# Patient Record
Sex: Male | Born: 1950
Health system: Southern US, Community
[De-identification: ages and names within clinical notes are randomized; demographics above are authoritative.]

## PROBLEM LIST (undated history)

## (undated) DIAGNOSIS — I712 Thoracic aortic aneurysm, without rupture, unspecified: Secondary | ICD-10-CM

## (undated) DIAGNOSIS — E119 Type 2 diabetes mellitus without complications: Secondary | ICD-10-CM

## (undated) DIAGNOSIS — F419 Anxiety disorder, unspecified: Secondary | ICD-10-CM

## (undated) DIAGNOSIS — E7800 Pure hypercholesterolemia, unspecified: Secondary | ICD-10-CM

## (undated) DIAGNOSIS — T8859XA Other complications of anesthesia, initial encounter: Secondary | ICD-10-CM

## (undated) DIAGNOSIS — J449 Chronic obstructive pulmonary disease, unspecified: Secondary | ICD-10-CM

## (undated) DIAGNOSIS — K851 Biliary acute pancreatitis without necrosis or infection: Secondary | ICD-10-CM

## (undated) DIAGNOSIS — M1990 Unspecified osteoarthritis, unspecified site: Secondary | ICD-10-CM

## (undated) DIAGNOSIS — N400 Enlarged prostate without lower urinary tract symptoms: Secondary | ICD-10-CM

## (undated) DIAGNOSIS — Q2381 Bicuspid aortic valve: Secondary | ICD-10-CM

## (undated) DIAGNOSIS — I359 Nonrheumatic aortic valve disorder, unspecified: Secondary | ICD-10-CM

## (undated) DIAGNOSIS — K219 Gastro-esophageal reflux disease without esophagitis: Secondary | ICD-10-CM

## (undated) DIAGNOSIS — Q231 Congenital insufficiency of aortic valve: Secondary | ICD-10-CM

## (undated) DIAGNOSIS — I480 Paroxysmal atrial fibrillation: Secondary | ICD-10-CM

## (undated) DIAGNOSIS — R7303 Prediabetes: Secondary | ICD-10-CM

## (undated) DIAGNOSIS — I2510 Atherosclerotic heart disease of native coronary artery without angina pectoris: Secondary | ICD-10-CM

## (undated) DIAGNOSIS — Z7901 Long term (current) use of anticoagulants: Secondary | ICD-10-CM

## (undated) DIAGNOSIS — G459 Transient cerebral ischemic attack, unspecified: Secondary | ICD-10-CM

## (undated) DIAGNOSIS — I10 Essential (primary) hypertension: Secondary | ICD-10-CM

## (undated) DIAGNOSIS — Z8679 Personal history of other diseases of the circulatory system: Secondary | ICD-10-CM

## (undated) DIAGNOSIS — I639 Cerebral infarction, unspecified: Secondary | ICD-10-CM

## (undated) DIAGNOSIS — F32A Depression, unspecified: Secondary | ICD-10-CM

## (undated) HISTORY — PX: ACHILLES TENDON REPAIR: SUR1153

## (undated) HISTORY — DX: Nonrheumatic aortic valve disorder, unspecified: I35.9

## (undated) HISTORY — DX: Essential (primary) hypertension: I10

## (undated) HISTORY — DX: Benign prostatic hyperplasia without lower urinary tract symptoms: N40.0

## (undated) HISTORY — DX: Pure hypercholesterolemia, unspecified: E78.00

## (undated) HISTORY — PX: BACK SURGERY: SHX140

## (undated) HISTORY — DX: Transient cerebral ischemic attack, unspecified: G45.9

## (undated) HISTORY — DX: Chronic obstructive pulmonary disease, unspecified: J44.9

## (undated) HISTORY — DX: Unspecified osteoarthritis, unspecified site: M19.90

## (undated) HISTORY — PX: FOOT SURGERY: SHX648

## (undated) HISTORY — PX: CARDIAC VALVE REPLACEMENT: SHX585

## (undated) HISTORY — DX: Thoracic aortic aneurysm, without rupture: I71.2

## (undated) HISTORY — DX: Thoracic aortic aneurysm, without rupture, unspecified: I71.20

## (undated) HISTORY — PX: ASCENDING AORTIC ANEURYSM REPAIR: SHX1191

## (undated) HISTORY — DX: Gastro-esophageal reflux disease without esophagitis: K21.9

## (undated) HISTORY — PX: AORTIC VALVE REPLACEMENT: SHX41

---

## 2002-10-17 ENCOUNTER — Ambulatory Visit (HOSPITAL_COMMUNITY): Admission: RE | Admit: 2002-10-17 | Discharge: 2002-10-17 | Payer: Self-pay

## 2006-11-30 HISTORY — PX: LEFT HEART CATH AND CORONARY ANGIOGRAPHY: CATH118249

## 2006-12-01 HISTORY — PX: ASCENDING AORTIC ANEURYSM REPAIR W/ MECHANICAL AORTIC VALVE REPLACEMENT: SHX1192

## 2010-02-03 DIAGNOSIS — G459 Transient cerebral ischemic attack, unspecified: Secondary | ICD-10-CM

## 2010-02-03 HISTORY — DX: Transient cerebral ischemic attack, unspecified: G45.9

## 2010-03-01 ENCOUNTER — Emergency Department (HOSPITAL_BASED_OUTPATIENT_CLINIC_OR_DEPARTMENT_OTHER)
Admission: EM | Admit: 2010-03-01 | Discharge: 2010-03-01 | Disposition: A | Discharge status: Other Acute Inpatient Hospital | Payer: Self-pay | Source: Home / Self Care | Admitting: Emergency Medicine

## 2010-03-01 ENCOUNTER — Inpatient Hospital Stay (INDEPENDENT_AMBULATORY_CARE_PROVIDER_SITE_OTHER)
Admission: AD | Admit: 2010-03-01 | Discharge: 2010-03-03 | Payer: Self-pay | Source: Home / Self Care | Attending: Internal Medicine | Admitting: Internal Medicine

## 2010-03-01 DIAGNOSIS — R413 Other amnesia: Secondary | ICD-10-CM

## 2010-03-01 DIAGNOSIS — R209 Unspecified disturbances of skin sensation: Secondary | ICD-10-CM

## 2010-03-01 DIAGNOSIS — R42 Dizziness and giddiness: Secondary | ICD-10-CM

## 2010-03-01 LAB — BASIC METABOLIC PANEL
BUN: 21 mg/dL (ref 6–23)
CO2: 24 mEq/L (ref 19–32)
Calcium: 9.5 mg/dL (ref 8.4–10.5)
Chloride: 107 mEq/L (ref 96–112)
Creatinine, Ser: 1.2 mg/dL (ref 0.4–1.5)
GFR calc Af Amer: >60 mL/min (ref >60)
GFR calc non Af Amer: >60 mL/min (ref >60)
Glucose, Bld: 110 mg/dL — ABNORMAL HIGH (ref 70–99)
Potassium: 4.2 mEq/L (ref 3.5–5.1)
Sodium: 143 mEq/L (ref 135–145)

## 2010-03-01 LAB — POCT CARDIAC MARKERS
CKMB, poc: 1.8 ng/mL (ref 1.0–8.0)
Myoglobin, poc: 100 ng/mL (ref 12–200)
Troponin i, poc: <0.05 ng/mL (ref 0.00–0.09)

## 2010-03-01 LAB — COMPREHENSIVE METABOLIC PANEL
ALT: 28 U/L (ref 0–53)
AST: 32 U/L (ref 0–37)
Albumin: 3.9 g/dL (ref 3.5–5.2)
Alkaline Phosphatase: 55 U/L (ref 39–117)
BUN: 16 mg/dL (ref 6–23)
CO2: 27 mEq/L (ref 19–32)
Calcium: 9.3 mg/dL (ref 8.4–10.5)
Chloride: 106 mEq/L (ref 96–112)
Creatinine, Ser: 1.22 mg/dL (ref 0.4–1.5)
GFR calc Af Amer: >60 mL/min (ref >60)
GFR calc non Af Amer: >60 mL/min (ref >60)
Glucose, Bld: 117 mg/dL — ABNORMAL HIGH (ref 70–99)
Potassium: 4 mEq/L (ref 3.5–5.1)
Sodium: 139 mEq/L (ref 135–145)
Total Bilirubin: 1.6 mg/dL — ABNORMAL HIGH (ref 0.3–1.2)
Total Protein: 7.1 g/dL (ref 6.0–8.3)

## 2010-03-01 LAB — PROTIME-INR
INR: 1.32 (ref 0.00–1.49)
INR: 1.36 (ref 0.00–1.49)
Prothrombin Time: 16.6 seconds — ABNORMAL HIGH (ref 11.6–15.2)
Prothrombin Time: 17 seconds — ABNORMAL HIGH (ref 11.6–15.2)

## 2010-03-01 LAB — DIFFERENTIAL
Basophils Absolute: 0 10*3/uL (ref 0.0–0.1)
Basophils Relative: 1 % (ref 0–1)
Eosinophils Absolute: 0.3 10*3/uL (ref 0.0–0.7)
Eosinophils Relative: 5 % (ref 0–5)
Lymphocytes Relative: 44 % (ref 12–46)
Lymphs Abs: 2.4 10*3/uL (ref 0.7–4.0)
Monocytes Absolute: 0.6 10*3/uL (ref 0.1–1.0)
Monocytes Relative: 10 % (ref 3–12)
Neutro Abs: 2.2 10*3/uL (ref 1.7–7.7)
Neutrophils Relative %: 40 % — ABNORMAL LOW (ref 43–77)

## 2010-03-01 LAB — CARDIAC PANEL(CRET KIN+CKTOT+MB+TROPI)
CK, MB: 1.7 ng/mL (ref 0.3–4.0)
Relative Index: 0.8 (ref 0.0–2.5)
Total CK: 218 U/L (ref 7–232)
Troponin I: 0.01 ng/mL (ref 0.00–0.06)

## 2010-03-01 LAB — CBC
HCT: 38.5 % — ABNORMAL LOW (ref 39.0–52.0)
HCT: 38 % — ABNORMAL LOW (ref 39.0–52.0)
Hemoglobin: 13.8 g/dL (ref 13.0–17.0)
Hemoglobin: 13.7 g/dL (ref 13.0–17.0)
MCH: 30.7 pg (ref 26.0–34.0)
MCH: 31.8 pg (ref 26.0–34.0)
MCHC: 35.8 g/dL (ref 30.0–36.0)
MCHC: 36.1 g/dL — ABNORMAL HIGH (ref 30.0–36.0)
MCV: 85.6 fL (ref 78.0–100.0)
MCV: 88.2 fL (ref 78.0–100.0)
Platelets: 171 10*3/uL (ref 150–400)
Platelets: 152 10*3/uL (ref 150–400)
RBC: 4.5 MIL/uL (ref 4.22–5.81)
RBC: 4.31 MIL/uL (ref 4.22–5.81)
RDW: 13 % (ref 11.5–15.5)
RDW: 12.9 % (ref 11.5–15.5)
WBC: 5.6 10*3/uL (ref 4.0–10.5)
WBC: 6.1 10*3/uL (ref 4.0–10.5)

## 2010-03-01 LAB — URINALYSIS, ROUTINE W REFLEX MICROSCOPIC
Bilirubin Urine: NEGATIVE
Hgb urine dipstick: NEGATIVE
Ketones, ur: NEGATIVE mg/dL
Nitrite: NEGATIVE
Protein, ur: NEGATIVE mg/dL
Specific Gravity, Urine: 1.015 (ref 1.005–1.030)
Urine Glucose, Fasting: NEGATIVE mg/dL
Urobilinogen, UA: 0.2 mg/dL (ref 0.0–1.0)
pH: 5.5 (ref 5.0–8.0)

## 2010-03-01 LAB — GLUCOSE, CAPILLARY: Glucose-Capillary: 125 mg/dL — ABNORMAL HIGH (ref 70–99)

## 2010-03-01 LAB — APTT: aPTT: 35 seconds (ref 24–37)

## 2010-03-02 LAB — GLUCOSE, CAPILLARY
Glucose-Capillary: 107 mg/dL — ABNORMAL HIGH (ref 70–99)
Glucose-Capillary: 121 mg/dL — ABNORMAL HIGH (ref 70–99)
Glucose-Capillary: 115 mg/dL — ABNORMAL HIGH (ref 70–99)
Glucose-Capillary: 98 mg/dL (ref 70–99)
Glucose-Capillary: 147 mg/dL — ABNORMAL HIGH (ref 70–99)

## 2010-03-02 LAB — LIPID PANEL
Cholesterol: 118 mg/dL (ref 0–200)
HDL: 21 mg/dL — ABNORMAL LOW (ref >39)
LDL Cholesterol: 67 mg/dL (ref 0–99)
Total CHOL/HDL Ratio: 5.6 RATIO
Triglycerides: 152 mg/dL — ABNORMAL HIGH (ref <150)
VLDL: 30 mg/dL (ref 0–40)

## 2010-03-02 LAB — URINALYSIS, ROUTINE W REFLEX MICROSCOPIC
Bilirubin Urine: NEGATIVE
Hgb urine dipstick: NEGATIVE
Ketones, ur: NEGATIVE mg/dL
Nitrite: NEGATIVE
Protein, ur: NEGATIVE mg/dL
Specific Gravity, Urine: 1.015 (ref 1.005–1.030)
Urine Glucose, Fasting: NEGATIVE mg/dL
Urobilinogen, UA: 0.2 mg/dL (ref 0.0–1.0)
pH: 5.5 (ref 5.0–8.0)

## 2010-03-02 LAB — URINE CULTURE
Colony Count: NO GROWTH
Culture  Setup Time: 201201271741
Culture: NO GROWTH

## 2010-03-02 LAB — HEMOGLOBIN A1C
Hgb A1c MFr Bld: 6.2 % — ABNORMAL HIGH (ref <5.7)
Mean Plasma Glucose: 131 mg/dL — ABNORMAL HIGH (ref <117)

## 2010-03-03 LAB — GLUCOSE, CAPILLARY
Glucose-Capillary: 135 mg/dL — ABNORMAL HIGH (ref 70–99)
Glucose-Capillary: 153 mg/dL — ABNORMAL HIGH (ref 70–99)

## 2010-03-03 LAB — PROTIME-INR
INR: 1.07 (ref 0.00–1.49)
Prothrombin Time: 14.1 seconds (ref 11.6–15.2)

## 2010-03-14 ENCOUNTER — Ambulatory Visit (HOSPITAL_COMMUNITY)
Admission: RE | Admit: 2010-03-14 | Discharge: 2010-03-14 | Disposition: A | Discharge status: Home / Self Care | Payer: 59 | Source: Ambulatory Visit | Attending: Radiation Oncology | Admitting: Radiation Oncology

## 2010-03-14 DIAGNOSIS — G459 Transient cerebral ischemic attack, unspecified: Secondary | ICD-10-CM | POA: Insufficient documentation

## 2010-03-14 DIAGNOSIS — I672 Cerebral atherosclerosis: Secondary | ICD-10-CM | POA: Insufficient documentation

## 2010-03-14 NOTE — H&P (Signed)
NAME:  Alexander Barton, FRERKING NO.:  [id number]  MEDICAL RECORD NO.:  [id number]          PATIENT TYPE:  INP  LOCATION:  1417                         FACILITY:  [facility]  PHYSICIAN:  Calvert Cantor, M.D.     DATE OF BIRTH:  08-13-1950  DATE OF ADMISSION:  03/01/2010 DATE OF DISCHARGE:                             HISTORY & PHYSICAL   PRIMARY CARE PHYSICIAN:  Dr. Kathrynn Running at [facility].  The patient does not know the PCP's first name.  PRESENTING COMPLAINT:  Numbness and tingling and memory loss, temporary  HISTORY OF PRESENT ILLNESS:  This is a 60 year old male who has had an aortic valve repair, repair of an ascending aortic aneurysm, diabetes mellitus, hyperlipidemia, COPD, and gastroesophageal reflux disease.  The patient went to the [facility] today with a complaint of numbness and tingling in the face.  Apparently, his symptoms started yesterday afternoon at 3:00 p.m.  He initially noticed a funny sensation in the upper part of his face.  He points to his cheek bones and his forehead.  He states that it felt like the numbness or tingling.  He also noted sudden trouble with his long term memory.  He was at work and he was not able to recall simple things that he usually would be able to remember.  It lasted for 1 hour.  He felt an intense amount of fatigue during the episode which continued after the episode.  This morning, he woke up feeling continued fatigue and continued tingling and numbness in his upper face and then decided to go to [facility].  At [facility], it was noted that he had a mild facial droop as well, and therefore he was referred for admission.  Family is present during the interview and do not notice that the patient has a facial droop. The patient has not noticed it either; therefore, it is difficult to say if this is new or old.  When asked about slurred speech, his wife states that day before yesterday, she  noticed one sentence that was slurred but he has not had any other slurred speech.  The patient does not complain of any other focal numbness, weakness, or tingling.  He did not notice any visual changes such as blurred vision or double vision.  He does not complain of any headaches.  The patient does have a mechanical valve and needs to keep his INR between 1.5 and 2.0.  When checked this morning, his INR was low at 1.3. He has not yet taken his Coumadin for the day.  PAST MEDICAL HISTORY: 1. Ascending aortic aneurysm repair 3 years ago.  The patient states     that he currently has a small ulcer in the posterior wall which is     being followed on a yearly basis at [facility]. 2. Aortic valve replacement with Onyx valve for which his INR needs to     be between 1.5 and 2.0. 3. Diabetes mellitus, recently found in December which he is     controlling through diet and exercise. 4. Chronic obstructive pulmonary disease due to working in  a [place]     [place]. 5. Gastroesophageal reflux disease. 6. Hyperlipidemia, currently controlled on Lipitor. 7. BPH.  PAST SURGICAL HISTORY:  Other surgery includes: 1. Surgery for hammer toes bilaterally. 2. Surgery on the right foot. 3. Surgery in the right lower eyelid.  MEDICATIONS:  He takes: 1. Coumadin 7 mg alternating with 5 mg every other day.  Today, he is     due to take 7.  Coumadin is at bedtime. 2. Metoprolol ER 50 mg daily. 3. Tamsulosin, generic for Flomax, is 0.4 mg at bedtime. 4. Lipitor is 20 mg at bedtime. 5. Aspirin is 81 mg at bedtime. 6. Spiriva 18 mcg daily. 7. Prevacid 30 mg daily.  SOCIAL HISTORY:  Does not smoke or drink.  Works in [employer].  FAMILY HISTORY:  Father had Alzheimer's.  Mother has a defibrillator.  ALLERGIES: 1. HE IS ALLERGIC TO CONTRAST DYE. 2. HE IS ALLERGIC TO CODEINE WHICH CAUSES ANAPHYLAXIS. 3. SEROQUEL CAUSES DELUSIONS.  REVIEW OF SYSTEMS:  No recent weight loss or weight gain.  No  frequent headaches.  No blurred vision, double vision.  No sore throat, sinus trouble, or earache.  RESPIRATORY:  Mild dry cough.  No wheezing.  No shortness of breath.  CARDIAC:  No chest pain, palpitations, or pedal edema.  GI:  No nausea, vomiting, or diarrhea.  He has constipation and mild reflux.  GU:  He has troubles with urination which are controlled with his tamsulosin.  HEMATOLOGICALLY:  Bruises easily.  SKIN:  No rash. NEUROLOGICALLY:  He has never had a stroke or seizure in the past. PSYCHOLOGICALLY:  No anxiety, depression.  MUSCULOSKELETAL:  Does complain of right shoulder pain which he describes as a constant ache.  PHYSICAL EXAM:  GENERAL:  Elderly male laying in bed in no acute distress. VITALS:  Temperature 97.4, pulse 68, respirations 18, blood pressure 130/86, pulse ox 95% on room air. HEENT:  Pupils equal, round, reactive to light.  Extraocular movements are intact.  Conjunctiva is pink.  No scleral icterus.  Oral mucosa is moist.  Normal dentition. NECK:  Supple.  No thyromegaly, lymphadenopathy, carotid bruits. HEART:  Regular rate and rhythm.  No murmurs.  There is click in the aortic area.  No rubs, no gallops. LUNGS:  Clear bilaterally.  Normal respiratory effort.  No use of accessory muscles. ABDOMEN:  Soft, nontender, nondistended.  Bowel sounds positive.  No organomegaly. EXTREMITIES:  No cyanosis, clubbing, or edema.  Pedal pulses positive. NEUROLOGICALLY:  He does have a mild right facial droop but when asked to smile, he is able to smile appropriately.  Therefore, I would not call this a facial nerve paralysis.  Cranial nerves II-XII are intact. Strength is 5/5 in all 4 extremities.  Sensation, I have checked in sensation in all areas of the face and currently there is no decreased sensation to light touch.  The patient still continues to state that he has a funny tingling feeling in the upper part of his face. PSYCHOLOGICALLY:  Awake, alert, oriented  x3.  Mood and affect normal. SKIN:  Warm, dry.  No rash or bruising.  BLOOD WORK:  Abnormal blood work.  Glucose is slightly elevated at 110. His INR is 1.32.  CT of the head without contrast reveals small vessel ischemic changes and brain atrophy.  Chest x-ray 2-view does not reveal any acute disease.  EKG is sinus rhythm at 68 beats per minute.  ASSESSMENT/PLAN: 1. Numbness and tingling in the upper part of the face with  episode of     poor memory yesterday, I would call this a TIA rather than a CVA as     currently I cannot find any obvious signs on exam.  I have ordered     an MRI without contrast.  In addition, I have ordered carotid     Dopplers, 2-D echo, and neuro checks.  I have not ordered a lipid     profile as she states it was just done in December and was normal.     Since his INR is subtherapeutic, I will place him on full dose     Lovenox which should be discontinued once his INR reaches a level     greater than 1.5. 2. Diet-controlled diabetes mellitus.  Will place him on a sliding     scale and diabetic carb controlled diet. 3. Aortic valve replacement.  As mentioned above, he will be on     Lovenox until INR is therapeutic. 4. Chronic obstructive pulmonary disease.  Continue Spiriva 5. Benign prostatic hypertrophy. 6. History of thoracic aneurysm repair. 7. Gastroesophageal reflux disease. 8. Time on admission was 60 minutes. 9. The patient is a limited code.  He does want CPR but he does not     want to be intubated.     Calvert Cantor, M.D.     SR/MEDQ  D:  03/01/2010  T:  03/01/2010  Job:  [id number]  cc:   Dr. Kathrynn Running at [facility].  Electronically Signed by Calvert Cantor M.D. on 03/14/2010 12:29:13 PM

## 2010-03-14 NOTE — Discharge Summary (Signed)
NAME:  Alexander Barton, Alexander Barton NO.:  [id number]  MEDICAL RECORD NO.:  [id number]          PATIENT TYPE:  INP  LOCATION:  1417                         FACILITY:  [facility]  PHYSICIAN:  Calvert Cantor, M.D.     DATE OF BIRTH:  Jun 22, 1950  DATE OF ADMISSION:  03/01/2010 DATE OF DISCHARGE:  03/03/2010                              DISCHARGE SUMMARY   PRIMARY CARE PHYSICIAN:  Dr. Kathrynn Running at [facility].  PRESENTING COMPLAINT:  Numbness and tingling and memory loss.  DISCHARGE DIAGNOSES: 1. Probable transient ischemic attack. 2. Subtherapeutic INR. 3. Mechanical aortic valve, which is an On-X valve.  INR needs to be     between 1.5 and 2. 4. Ascending aortic aneurysm repair 3 years ago. 5. Diabetes mellitus, diet controlled. 6. Chronic obstructive pulmonary disease\ due to working in     [place]. 7. Gastroesophageal reflux disease. 8. Hyperlipidemia. 9. Benign prostatic hyperplasia.  DISCHARGE MEDICATIONS:1. Patient is being discharged on Lovenox 100 mg subcutaneously q.12h.     This will be discontinued when his INR level is greater than 1.5. 2. He can continue taking his Coumadin at 7.5 or 10 mg daily.  The     patient will be monitoring INRs at home. 3. Aspirin 81 mg daily. 4. Cinnamon 2000 mg OTC daily. 5. Metoprolol 50 mg daily. 6. Multivitamin 1 tablet daily. 7. Spiriva 18 mcg daily. 8. Tamsulosin 0.4 mg daily at bedtime.  HOSPITAL COURSE:  This is a 60 year old gentleman, who went to the [facility] with a complaint of numbness and tingling in his face.  He was referred from [facility] for admission.  Upon my initial conversation with the ER doctor, she stated that he had right- sided facial droop and left-sided facial numbness; however, once the patient arrived to the hospital and I saw him myself, the patient stated that his numbness was present in the upper part of his face.  He stated that it had started the day before around 3 o'clock in  the afternoon. He had an episode at that time of this funny feeling in his face and it was associated with memory loss and severe fatigue, which lasted a good hour, subsequently, the memory loss improved, but the numbness and tingling did not resolve and therefore he decided to come to the hospital on the following day.  Patient underwent a neurological workup including an MRI of his brain, which is negative and a 2-D echo, which reveals the following: 1. Wall thickness was increased in the pattern of mild LVH, systolic     function was normal, estimated EF was 55%-60%.  There were no     regional wall motion abnormalities noted, mildly thickened aortic     valve, status post AVR with On-X valve.  No stenosis.  No     regurgitation. 2. Aorta was not dilated status post ascending aorta aortic aneurysm     repair. 3. Mitral regurgitation. 4. Left atrium was moderately dilated. 5. Atrial septum boud from left to right consistent with increased     left atrial filling pressure, a very small patent forearm ovale     cannot be  excluded, consider TEE for further evaluation if     indicated.  Patient declined a TEE also since he had an extensive cardiac workup, AVR and an aneurysm repair just 3 years before.  I highly doubt that he does have a patent foremen ovale as admit to with been found at that time.  Neck:.  Please note other radiological findings are as below: 1. Chest x-ray two-view, no acute disease. 2. CT of the head without contrast, small vessel ischemic change and     brain atrophy. 3. MRI of the brain without contrast reveals no evidence for acute or     subacute infarction.  There are scattered subcortical T2     hyperintensities, which are advanced for age.  Finding is     nonspecific, but can be seen in the setting of chronic     microvascular ischemia, demyelinating process such as multiple     sclerosis, vasculitis, complicated migraine headaches or as the      sequelae of a prior infectious or inflammatory process. 4. Mild anterior ethmoid and frontal sinus disease.  CONDITION ON DISCHARGE:  Stable follow up with.  FOLLOWUP:  Following with Dr. Kathrynn Running in 1 week.  Time on discharge 65 minutes.     Calvert Cantor, M.D.     SR/MEDQ  D:  03/05/2010  T:  03/05/2010  Job:  [id number]  cc:   Dr. Kathrynn Running  Electronically Signed by Calvert Cantor M.D. on 03/14/2010 12:29:07 PM

## 2010-03-25 ENCOUNTER — Institutional Professional Consult (permissible substitution) (INDEPENDENT_AMBULATORY_CARE_PROVIDER_SITE_OTHER): Payer: 59 | Admitting: Cardiology

## 2010-03-25 DIAGNOSIS — I359 Nonrheumatic aortic valve disorder, unspecified: Secondary | ICD-10-CM

## 2010-03-25 DIAGNOSIS — G459 Transient cerebral ischemic attack, unspecified: Secondary | ICD-10-CM

## 2010-05-06 ENCOUNTER — Telehealth: Payer: Self-pay | Admitting: Cardiology

## 2010-05-06 NOTE — Telephone Encounter (Signed)
NEEDS NOTES AND ANY TESTING FROM 03/25/2010 TO PRESENT DR. MANNING SENT PATIENT Alexander Barton IS FROM IS OFFICE FAX [phone]

## 2010-12-10 ENCOUNTER — Ambulatory Visit (INDEPENDENT_AMBULATORY_CARE_PROVIDER_SITE_OTHER): Payer: 59 | Admitting: Cardiology

## 2010-12-10 ENCOUNTER — Telehealth: Payer: Self-pay | Admitting: *Deleted

## 2010-12-10 ENCOUNTER — Encounter: Payer: Self-pay | Admitting: Cardiology

## 2010-12-10 VITALS — BP 112/70 | HR 68 | Ht 74.0 in | Wt 235.8 lb

## 2010-12-10 DIAGNOSIS — Z952 Presence of prosthetic heart valve: Secondary | ICD-10-CM

## 2010-12-10 DIAGNOSIS — E7800 Pure hypercholesterolemia, unspecified: Secondary | ICD-10-CM

## 2010-12-10 DIAGNOSIS — N400 Enlarged prostate without lower urinary tract symptoms: Secondary | ICD-10-CM

## 2010-12-10 DIAGNOSIS — E785 Hyperlipidemia, unspecified: Secondary | ICD-10-CM | POA: Insufficient documentation

## 2010-12-10 DIAGNOSIS — G459 Transient cerebral ischemic attack, unspecified: Secondary | ICD-10-CM

## 2010-12-10 DIAGNOSIS — I712 Thoracic aortic aneurysm, without rupture: Secondary | ICD-10-CM

## 2010-12-10 DIAGNOSIS — I359 Nonrheumatic aortic valve disorder, unspecified: Secondary | ICD-10-CM | POA: Insufficient documentation

## 2010-12-10 DIAGNOSIS — E119 Type 2 diabetes mellitus without complications: Secondary | ICD-10-CM | POA: Insufficient documentation

## 2010-12-10 DIAGNOSIS — R5381 Other malaise: Secondary | ICD-10-CM

## 2010-12-10 DIAGNOSIS — I10 Essential (primary) hypertension: Secondary | ICD-10-CM | POA: Insufficient documentation

## 2010-12-10 DIAGNOSIS — R0600 Dyspnea, unspecified: Secondary | ICD-10-CM | POA: Insufficient documentation

## 2010-12-10 DIAGNOSIS — R5383 Other fatigue: Secondary | ICD-10-CM

## 2010-12-10 LAB — BASIC METABOLIC PANEL
BUN: 21 mg/dL (ref 6–23)
CO2: 26 mEq/L (ref 19–32)
Calcium: 9.4 mg/dL (ref 8.4–10.5)
Chloride: 104 mEq/L (ref 96–112)
Creatinine, Ser: 1.3 mg/dL (ref 0.4–1.5)
GFR: 62.16 mL/min (ref >60.00)
Glucose, Bld: 96 mg/dL (ref 70–99)
Potassium: 4.5 mEq/L (ref 3.5–5.1)
Sodium: 138 mEq/L (ref 135–145)

## 2010-12-10 LAB — CBC WITH DIFFERENTIAL/PLATELET
Basophils Absolute: 0 10*3/uL (ref 0.0–0.1)
Basophils Relative: 0.3 % (ref 0.0–3.0)
Eosinophils Absolute: 0.3 10*3/uL (ref 0.0–0.7)
Eosinophils Relative: 4.1 % (ref 0.0–5.0)
HCT: 42.8 % (ref 39.0–52.0)
Hemoglobin: 14.8 g/dL (ref 13.0–17.0)
Lymphocytes Relative: 36.6 % (ref 12.0–46.0)
Lymphs Abs: 2.8 10*3/uL (ref 0.7–4.0)
MCHC: 34.5 g/dL (ref 30.0–36.0)
MCV: 92.3 fl (ref 78.0–100.0)
Monocytes Absolute: 0.7 10*3/uL (ref 0.1–1.0)
Monocytes Relative: 9.6 % (ref 3.0–12.0)
Neutro Abs: 3.8 10*3/uL (ref 1.4–7.7)
Neutrophils Relative %: 49.4 % (ref 43.0–77.0)
Platelets: 176 10*3/uL (ref 150.0–400.0)
RBC: 4.64 Mil/uL (ref 4.22–5.81)
RDW: 13.7 % (ref 11.5–14.6)
WBC: 7.8 10*3/uL (ref 4.5–10.5)

## 2010-12-10 LAB — TSH: TSH: 1.78 u[IU]/mL (ref 0.35–5.50)

## 2010-12-10 LAB — HEPATIC FUNCTION PANEL
ALT: 29 U/L (ref 0–53)
AST: 27 U/L (ref 0–37)
Albumin: 4.2 g/dL (ref 3.5–5.2)
Alkaline Phosphatase: 60 U/L (ref 39–117)
Bilirubin, Direct: 0.1 mg/dL (ref 0.0–0.3)
Total Bilirubin: 1 mg/dL (ref 0.3–1.2)
Total Protein: 7.3 g/dL (ref 6.0–8.3)

## 2010-12-10 LAB — PSA: PSA: 0.23 ng/mL (ref 0.10–4.00)

## 2010-12-10 NOTE — Assessment & Plan Note (Signed)
Blood pressure is well controlled on exam today. 

## 2010-12-10 NOTE — Assessment & Plan Note (Signed)
His bowel function is stable by exam and echocardiogram. His LV function is normal. He will continue with anticoagulation and he needs routine SBE prophylaxis.

## 2010-12-10 NOTE — Progress Notes (Signed)
Alexander Barton Date of Birth: 1950-12-22 Medical Record #[id number]  History of Present Illness: Alexander Barton is seen today for followup. He reports that he is feeling well. He admits that he is not exercising as much because he doesn't have energy. He does complain of dyspnea with fairly minimal exertion. His weight has increased. He states that since his TIA in January just hasn't had much motivation. He recently had evaluation at [facility] including an echocardiogram. This was fairly unremarkable demonstrating normal prosthetic aortic valve function. They considered doing a followup CT but given his history of dye allergy they deferred this. His CTs of the past 3 years have been stable. When he had his aortic valve replacement in 2008 he had no coronary disease at that time.  Current Outpatient Prescriptions on File Prior to Visit  Medication Sig Dispense Refill  . aspirin 81 MG tablet Take 81 mg by mouth daily.        . metoprolol (LOPRESSOR) 50 MG tablet Take 50 mg by mouth 2 (two) times daily.        Marland Kitchen omeprazole (PRILOSEC) 20 MG capsule Take 20 mg by mouth daily.        . Tamsulosin HCl (FLOMAX) 0.4 MG CAPS Take by mouth daily.        Marland Kitchen tiotropium (SPIRIVA) 18 MCG inhalation capsule Place 18 mcg into inhaler and inhale daily.        . Warfarin Sodium (COUMADIN PO) Take by mouth as directed.          Allergies  Allergen Reactions  . Codeine   . Hydrocodone   . Hydromorphone   . Ivp Dye (Iodinated Diagnostic Agents)   . Lipitor (Atorvastatin Calcium) Other (See Comments)    Myalgias   . Other     Hydromorphone  . Seroquel (Quetiapine Fumerate)     Past Medical History  Diagnosis Date  . HTN (hypertension)   . Hypercholesterolemia   . COPD (chronic obstructive pulmonary disease)   . GERD (gastroesophageal reflux disease)   . Diabetes mellitus   . TIA (transient ischemic attack)   . Aortic aneurysm, thoracic   . Aortic valve disease     Past  Surgical History  Procedure Date  . Aortic valve replacement     #23 On-X valve conduit  . Back surgery   . Foot surgery   . Achilles tendon repair     History  Smoking status  . Never Smoker   Smokeless tobacco  . Not on file    History  Alcohol Use No    Family History  Problem Relation Age of Onset  . Alzheimer's disease Father   . Hypertension Sister   . Hypertension Brother     Review of Systems: The review of systems is positive for some intermittent atypical chest pain. One episode was described as a sharp pain radiating from his posterior left back through his chest. This was not related to exertion..  All other systems were reviewed and are negative.  Physical Exam: BP 112/70  Pulse 68  Ht 6\' 2"  (1.88 m)  Wt 235 lb 12.8 oz (106.958 kg)  BMI 30.27 kg/m2 He is an overweight white male in no acute distress. He is normocephalic, atraumatic. Pupils are equal round and reactive to light accommodation. Extraocular movements are full. Oropharynx is clear. Neck is supple no JVD, adenopathy, thyromegaly, or bruits. Lungs are clear. Cardiac exam reveals a good mechanical aortic valve click. His rhythm is  regular. He has no murmur or rub. PMI is normal. Abdomen is soft nontender without masses or bruits. Extremities are without cyanosis or edema. Pulses are 2+ and symmetric. Skin is warm and dry. He is alert and oriented x3. Cranial nerves II through XII are intact. He has no focal motor or sensory deficits. LABORATORY DATA:  ECG today demonstrates normal sinus rhythm with a normal ECG. Assessment / Plan:

## 2010-12-10 NOTE — Assessment & Plan Note (Signed)
I think that his dyspnea is related to deconditioning. His recent echocardiogram was unremarkable showing normal aortic valve function and normal left ventricular function. He has no history of coronary disease. I recommended a regular aerobic exercise with focus on weight loss. We will check basic lab work today including chemistries, CBC, and TSH.

## 2010-12-10 NOTE — Telephone Encounter (Signed)
Notified of lab results. Will send copy to Dr. Derrek Gu.

## 2010-12-10 NOTE — Patient Instructions (Signed)
We will check blood work today. ( Bmet,hfp,cbc, tsh, psa)  Continue your current medications.  You need to exercise consistently 5 days a week to build up your conditioning.  I will see you again in 6 months.

## 2010-12-11 ENCOUNTER — Telehealth: Payer: Self-pay | Admitting: *Deleted

## 2010-12-11 NOTE — Telephone Encounter (Signed)
Faxed office note to Dr. G. Italy Hughes at [facility]; fax [phone]; ph [phone].

## 2011-01-07 DIAGNOSIS — Z95828 Presence of other vascular implants and grafts: Secondary | ICD-10-CM | POA: Insufficient documentation

## 2011-12-26 DIAGNOSIS — Z952 Presence of prosthetic heart valve: Secondary | ICD-10-CM | POA: Insufficient documentation

## 2012-01-14 ENCOUNTER — Encounter (INDEPENDENT_AMBULATORY_CARE_PROVIDER_SITE_OTHER): Payer: Self-pay | Admitting: Surgery

## 2012-01-14 ENCOUNTER — Ambulatory Visit (INDEPENDENT_AMBULATORY_CARE_PROVIDER_SITE_OTHER): Payer: 59 | Admitting: Surgery

## 2012-01-14 VITALS — BP 124/74 | HR 74 | Temp 97.4°F | Resp 18 | Ht 74.0 in | Wt 230.4 lb

## 2012-01-14 DIAGNOSIS — D1739 Benign lipomatous neoplasm of skin and subcutaneous tissue of other sites: Secondary | ICD-10-CM

## 2012-01-14 DIAGNOSIS — R1901 Right upper quadrant abdominal swelling, mass and lump: Secondary | ICD-10-CM

## 2012-01-14 DIAGNOSIS — D171 Benign lipomatous neoplasm of skin and subcutaneous tissue of trunk: Secondary | ICD-10-CM

## 2012-01-14 NOTE — Patient Instructions (Signed)
We will schedule an MRI to evaluate the mass in the tissues of your right upper abdominal wall.

## 2012-01-14 NOTE — Progress Notes (Signed)
NAME: Alexander Barton DOB: [birth year] MRN: [id number]                                                                                      DATE: 01/14/2012  PCP: Arlan Organ., MD Referring Provider: Arlan Organ., MD  IMPRESSION:  Likely lipoma, abdominal wall, right upper quadrant, sub-costal region  PLAN:  Will obtain an MRI to evaluate and be sure this is a benign appearing area. He would be at risk for surgical excsion, given his Coumadin use and aortic valve. If there is no suspicion of malignancy, this should be observed, no surgery                 CC:  Chief Complaint  Patient presents with  . Lipoma    RUQ abdominal wall    HPI:  Alexander Barton is a 61 y.o.  male who presents for evaluation of a sof mass in the RUQ/subcostal area. It has been present about 1.5 years, and seems to fluctualte in size a bit depending on whether he is taking lipitor or not (larger when off lipitor). It is not painful. No biopsy or imaging studies have been done.  He had a TIA when his INR drifted close to normal, so would need a "bridge" to have surgery  PMH:  has a past medical history of HTN (hypertension); Hypercholesterolemia; COPD (chronic obstructive pulmonary disease); GERD (gastroesophageal reflux disease); Diabetes mellitus; TIA (transient ischemic attack); Aortic aneurysm, thoracic; and Aortic valve disease.  PSH:   has past surgical history that includes Aortic valve replacement; Back surgery; Foot surgery; and Achilles tendon repair.  ALLERGIES:   Allergies  Allergen Reactions  . Codeine   . Hydrocodone   . Hydromorphone   . Ivp Dye (Iodinated Diagnostic Agents)   . Lipitor (Atorvastatin Calcium) Other (See Comments)    Myalgias   . Other     Hydromorphone  . Seroquel (Quetiapine Fumerate)     MEDICATIONS: Current outpatient prescriptions:aspirin 81 MG tablet, Take 81 mg by mouth daily.  , Disp: , Rfl: ;  metoprolol (LOPRESSOR) 50 MG tablet, Take 50 mg by  mouth 2 (two) times daily.  , Disp: , Rfl: ;  Tamsulosin HCl (FLOMAX) 0.4 MG CAPS, Take by mouth daily.  , Disp: , Rfl: ;  tiotropium (SPIRIVA) 18 MCG inhalation capsule, Place 18 mcg into inhaler and inhale daily.  , Disp: , Rfl:  Warfarin Sodium (COUMADIN PO), Take 7 mg by mouth as directed. 7mg  altern ing with 10 mg as a study with  [employer], Disp: , Rfl: ;  omeprazole (PRILOSEC) 20 MG capsule, Take 20 mg by mouth daily.  , Disp: , Rfl:   ROS: He has filled out our 12 point review of systems and it is negative except as in HPI. EXAM:   Vs:BP 124/74  Pulse 74  Temp 97.4 F (36.3 C) (Temporal)  Resp 18  Ht 6\' 2"  (1.88 m)  Wt 230 lb 6.4 oz (104.509 kg)  BMI 29.58 kg/m2 General:He ius alert, oreined generally healthy appearing gentleman Abd: There is a subtle mass in the subq of the RUQ, oriented parallel  to the costal margin, 5/10 cm, non tender, fairly sharply marginated superior, but less discrete inferior, not really movable; soft, not tender  DATA REVIEWED:  Old chart in Epic    Arriah Wadle J 01/14/2012  CC: Arlan Organ., MD, MANNING, Adelene Amas., MD

## 2012-01-19 ENCOUNTER — Ambulatory Visit
Admission: RE | Admit: 2012-01-19 | Discharge: 2012-01-19 | Disposition: A | Discharge status: Home / Self Care | Payer: 59 | Source: Ambulatory Visit | Attending: Surgery | Admitting: Surgery

## 2012-01-19 DIAGNOSIS — R1901 Right upper quadrant abdominal swelling, mass and lump: Secondary | ICD-10-CM

## 2012-01-19 DIAGNOSIS — D171 Benign lipomatous neoplasm of skin and subcutaneous tissue of trunk: Secondary | ICD-10-CM

## 2012-01-19 MED ORDER — GADOBENATE DIMEGLUMINE 529 MG/ML IV SOLN
19.0000 mL | Freq: Once | INTRAVENOUS | Status: AC | PRN
  Administered 2012-01-19: 19 mL via INTRAVENOUS

## 2012-01-19 NOTE — Progress Notes (Signed)
Quick Note:  Tell him no surgery needed. The area that we thought might be a lipoma is due to an irregularity in his rib cage, producing the appearance of a lipoma ______

## 2012-01-20 ENCOUNTER — Telehealth (INDEPENDENT_AMBULATORY_CARE_PROVIDER_SITE_OTHER): Payer: Self-pay | Admitting: General Surgery

## 2012-01-20 NOTE — Telephone Encounter (Signed)
Message copied by Liliana Cline on Tue Jan 20, 2012  8:41 AM ------      Message from: Currie Paris      Created: Mon Jan 19, 2012  2:52 PM       Tell him no surgery needed. The area that we thought might be a lipoma is due to an irregularity in his rib cage, producing the appearance of a lipoma

## 2012-01-20 NOTE — Telephone Encounter (Signed)
Patient made aware of results. Will call if he needs [place].

## 2012-06-03 ENCOUNTER — Encounter (HOSPITAL_COMMUNITY): Payer: Self-pay | Admitting: Emergency Medicine

## 2012-06-03 DIAGNOSIS — E119 Type 2 diabetes mellitus without complications: Secondary | ICD-10-CM | POA: Insufficient documentation

## 2012-06-03 DIAGNOSIS — G459 Transient cerebral ischemic attack, unspecified: Secondary | ICD-10-CM | POA: Insufficient documentation

## 2012-06-03 DIAGNOSIS — Z954 Presence of other heart-valve replacement: Secondary | ICD-10-CM | POA: Insufficient documentation

## 2012-06-03 DIAGNOSIS — R42 Dizziness and giddiness: Principal | ICD-10-CM | POA: Insufficient documentation

## 2012-06-03 DIAGNOSIS — J4489 Other specified chronic obstructive pulmonary disease: Secondary | ICD-10-CM | POA: Insufficient documentation

## 2012-06-03 DIAGNOSIS — J449 Chronic obstructive pulmonary disease, unspecified: Secondary | ICD-10-CM | POA: Insufficient documentation

## 2012-06-03 DIAGNOSIS — I10 Essential (primary) hypertension: Secondary | ICD-10-CM | POA: Insufficient documentation

## 2012-06-03 DIAGNOSIS — E785 Hyperlipidemia, unspecified: Secondary | ICD-10-CM | POA: Insufficient documentation

## 2012-06-03 DIAGNOSIS — E7800 Pure hypercholesterolemia, unspecified: Secondary | ICD-10-CM | POA: Insufficient documentation

## 2012-06-03 DIAGNOSIS — Z8673 Personal history of transient ischemic attack (TIA), and cerebral infarction without residual deficits: Secondary | ICD-10-CM | POA: Insufficient documentation

## 2012-06-03 DIAGNOSIS — Z7901 Long term (current) use of anticoagulants: Secondary | ICD-10-CM | POA: Insufficient documentation

## 2012-06-03 DIAGNOSIS — R4182 Altered mental status, unspecified: Secondary | ICD-10-CM | POA: Insufficient documentation

## 2012-06-03 DIAGNOSIS — R279 Unspecified lack of coordination: Secondary | ICD-10-CM | POA: Insufficient documentation

## 2012-06-03 LAB — COMPREHENSIVE METABOLIC PANEL
ALT: 26 U/L (ref 0–53)
AST: 31 U/L (ref 0–37)
Albumin: 4.3 g/dL (ref 3.5–5.2)
Alkaline Phosphatase: 66 U/L (ref 39–117)
BUN: 20 mg/dL (ref 6–23)
CO2: 27 mEq/L (ref 19–32)
Calcium: 9.9 mg/dL (ref 8.4–10.5)
Chloride: 100 mEq/L (ref 96–112)
Creatinine, Ser: 1.31 mg/dL (ref 0.50–1.35)
GFR calc Af Amer: 65 mL/min — ABNORMAL LOW (ref >90)
GFR calc non Af Amer: 56 mL/min — ABNORMAL LOW (ref >90)
Glucose, Bld: 105 mg/dL — ABNORMAL HIGH (ref 70–99)
Potassium: 4.2 mEq/L (ref 3.5–5.1)
Sodium: 138 mEq/L (ref 135–145)
Total Bilirubin: 0.6 mg/dL (ref 0.3–1.2)
Total Protein: 8.1 g/dL (ref 6.0–8.3)

## 2012-06-03 LAB — CBC WITH DIFFERENTIAL/PLATELET
Basophils Absolute: 0 10*3/uL (ref 0.0–0.1)
Basophils Relative: 0 % (ref 0–1)
Eosinophils Absolute: 0.3 10*3/uL (ref 0.0–0.7)
Eosinophils Relative: 4 % (ref 0–5)
HCT: 39.7 % (ref 39.0–52.0)
Hemoglobin: 14.5 g/dL (ref 13.0–17.0)
Lymphocytes Relative: 33 % (ref 12–46)
Lymphs Abs: 2.4 10*3/uL (ref 0.7–4.0)
MCH: 31.5 pg (ref 26.0–34.0)
MCHC: 36.5 g/dL — ABNORMAL HIGH (ref 30.0–36.0)
MCV: 86.3 fL (ref 78.0–100.0)
Monocytes Absolute: 0.5 10*3/uL (ref 0.1–1.0)
Monocytes Relative: 7 % (ref 3–12)
Neutro Abs: 4.1 10*3/uL (ref 1.7–7.7)
Neutrophils Relative %: 55 % (ref 43–77)
Platelets: 194 10*3/uL (ref 150–400)
RBC: 4.6 MIL/uL (ref 4.22–5.81)
RDW: 13.2 % (ref 11.5–15.5)
WBC: 7.3 10*3/uL (ref 4.0–10.5)

## 2012-06-03 NOTE — ED Notes (Signed)
PT. REPORTS DIZZINESS AND CONFUSION THIS MORNING AT WORK , ALERT AND ORIENTED AT ARRIVAL , SPEECH CLEAR / NO FACIAL ASYMMETRY , NO ARM DRIFT / AMBULATORY . STATES RECENTLY STRESSED AT WORK WITH DIFFICULTY SLEEPING.

## 2012-06-04 ENCOUNTER — Emergency Department (HOSPITAL_COMMUNITY): Payer: 59

## 2012-06-04 ENCOUNTER — Observation Stay (HOSPITAL_COMMUNITY)
Admission: EM | Admit: 2012-06-04 | Discharge: 2012-06-05 | Disposition: A | Discharge status: Home / Self Care | Payer: 59 | Attending: Internal Medicine | Admitting: Internal Medicine

## 2012-06-04 ENCOUNTER — Observation Stay (HOSPITAL_COMMUNITY): Payer: 59

## 2012-06-04 ENCOUNTER — Encounter (HOSPITAL_COMMUNITY): Payer: Self-pay | Admitting: Radiology

## 2012-06-04 DIAGNOSIS — I712 Thoracic aortic aneurysm, without rupture, unspecified: Secondary | ICD-10-CM

## 2012-06-04 DIAGNOSIS — R42 Dizziness and giddiness: Secondary | ICD-10-CM | POA: Insufficient documentation

## 2012-06-04 DIAGNOSIS — D171 Benign lipomatous neoplasm of skin and subcutaneous tissue of trunk: Secondary | ICD-10-CM

## 2012-06-04 DIAGNOSIS — E119 Type 2 diabetes mellitus without complications: Secondary | ICD-10-CM

## 2012-06-04 DIAGNOSIS — G459 Transient cerebral ischemic attack, unspecified: Secondary | ICD-10-CM

## 2012-06-04 DIAGNOSIS — I6381 Other cerebral infarction due to occlusion or stenosis of small artery: Secondary | ICD-10-CM

## 2012-06-04 DIAGNOSIS — I369 Nonrheumatic tricuspid valve disorder, unspecified: Secondary | ICD-10-CM

## 2012-06-04 DIAGNOSIS — I359 Nonrheumatic aortic valve disorder, unspecified: Secondary | ICD-10-CM | POA: Diagnosis present

## 2012-06-04 DIAGNOSIS — E785 Hyperlipidemia, unspecified: Secondary | ICD-10-CM | POA: Diagnosis present

## 2012-06-04 DIAGNOSIS — R0600 Dyspnea, unspecified: Secondary | ICD-10-CM

## 2012-06-04 DIAGNOSIS — I639 Cerebral infarction, unspecified: Secondary | ICD-10-CM

## 2012-06-04 DIAGNOSIS — I10 Essential (primary) hypertension: Secondary | ICD-10-CM

## 2012-06-04 DIAGNOSIS — E7800 Pure hypercholesterolemia, unspecified: Secondary | ICD-10-CM

## 2012-06-04 HISTORY — DX: Other cerebral infarction due to occlusion or stenosis of small artery: I63.81

## 2012-06-04 LAB — HEMOGLOBIN A1C
Hgb A1c MFr Bld: 5.9 % — ABNORMAL HIGH (ref <5.7)
Mean Plasma Glucose: 123 mg/dL — ABNORMAL HIGH (ref <117)

## 2012-06-04 LAB — URINALYSIS, ROUTINE W REFLEX MICROSCOPIC
Bilirubin Urine: NEGATIVE
Glucose, UA: NEGATIVE mg/dL
Hgb urine dipstick: NEGATIVE
Ketones, ur: NEGATIVE mg/dL
Leukocytes, UA: NEGATIVE
Nitrite: NEGATIVE
Protein, ur: NEGATIVE mg/dL
Specific Gravity, Urine: 1.013 (ref 1.005–1.030)
Urobilinogen, UA: 0.2 mg/dL (ref 0.0–1.0)
pH: 5 (ref 5.0–8.0)

## 2012-06-04 LAB — LIPID PANEL
Cholesterol: 247 mg/dL — ABNORMAL HIGH (ref 0–200)
HDL: 45 mg/dL (ref >39)
LDL Cholesterol: 169 mg/dL — ABNORMAL HIGH (ref 0–99)
Total CHOL/HDL Ratio: 5.5 RATIO
Triglycerides: 165 mg/dL — ABNORMAL HIGH (ref <150)
VLDL: 33 mg/dL (ref 0–40)

## 2012-06-04 LAB — PROTIME-INR
INR: 2.35 — ABNORMAL HIGH (ref 0.00–1.49)
Prothrombin Time: 24.7 seconds — ABNORMAL HIGH (ref 11.6–15.2)

## 2012-06-04 LAB — GLUCOSE, CAPILLARY
Glucose-Capillary: 108 mg/dL — ABNORMAL HIGH (ref 70–99)
Glucose-Capillary: 113 mg/dL — ABNORMAL HIGH (ref 70–99)
Glucose-Capillary: 115 mg/dL — ABNORMAL HIGH (ref 70–99)
Glucose-Capillary: 99 mg/dL (ref 70–99)
Glucose-Capillary: 113 mg/dL — ABNORMAL HIGH (ref 70–99)

## 2012-06-04 MED ORDER — ASPIRIN 81 MG PO TABS: 81.0000 mg | ORAL_TABLET | Freq: Every evening | ORAL | Status: DC

## 2012-06-04 MED ORDER — WARFARIN - PHARMACIST DOSING INPATIENT: Freq: Every day | Status: DC

## 2012-06-04 MED ORDER — TAMSULOSIN HCL 0.4 MG PO CAPS
0.4000 mg | ORAL_CAPSULE | Freq: Every evening | ORAL | Status: DC
  Administered 2012-06-04: 0.4 mg via ORAL
  Filled 2012-06-04 (×2): qty 1

## 2012-06-04 MED ORDER — ONDANSETRON HCL 4 MG/2ML IJ SOLN: 4.0000 mg | Freq: Three times a day (TID) | INTRAMUSCULAR | Status: AC | PRN

## 2012-06-04 MED ORDER — TIOTROPIUM BROMIDE MONOHYDRATE 18 MCG IN CAPS
18.0000 ug | ORAL_CAPSULE | Freq: Every day | RESPIRATORY_TRACT | Status: DC
  Filled 2012-06-04 (×2): qty 5

## 2012-06-04 MED ORDER — ASPIRIN 81 MG PO CHEW
81.0000 mg | CHEWABLE_TABLET | Freq: Every evening | ORAL | Status: DC
  Administered 2012-06-04: 81 mg via ORAL

## 2012-06-04 MED ORDER — METOPROLOL SUCCINATE ER 50 MG PO TB24
50.0000 mg | ORAL_TABLET | Freq: Every day | ORAL | Status: DC
  Administered 2012-06-04 – 2012-06-05 (×2): 50 mg via ORAL
  Filled 2012-06-04 (×2): qty 1

## 2012-06-04 MED ORDER — WARFARIN SODIUM 10 MG PO TABS
10.0000 mg | ORAL_TABLET | Freq: Every day | ORAL | Status: DC
  Administered 2012-06-04: 10 mg via ORAL
  Filled 2012-06-04 (×2): qty 1

## 2012-06-04 NOTE — ED Notes (Signed)
To x-ray

## 2012-06-04 NOTE — ED Notes (Signed)
The pt had an episode of  Dizziness and feeling disoriented.  None at present wife at his bedside

## 2012-06-04 NOTE — Progress Notes (Signed)
*  PRELIMINARY RESULTS* Vascular Ultrasound Carotid Duplex (Doppler) has been completed.  There is no obvious evidence of hemodynamically significant internal carotid artery stenosis bilaterally. Vertebral arteries are patent with antegrade flow.  06/04/2012 3:45 PM Gertie Fey, RDMS, RDCS

## 2012-06-04 NOTE — ED Provider Notes (Signed)
History    Is a 62 year old male presenting with vague sensation of dizziness confusion. Onset while at work earlier today. Symptoms currently resolved. Patient reports prior history of TIA with similar symptoms. He was also additionally a significant time she did not today. No headaches. He is at his baseline mental status per his wife at bedside. He is on Coumadin for history of aortic valve replacement.  CSN: [id number]  Arrival date & time 06/03/12  2149   None     Chief Complaint  Patient presents with  . Dizziness  . Altered Mental Status    (Consider location/radiation/quality/duration/timing/severity/associated sxs/prior treatment) HPI  Past Medical History  Diagnosis Date  . HTN (hypertension)   . Hypercholesterolemia   . COPD (chronic obstructive pulmonary disease)   . GERD (gastroesophageal reflux disease)   . Diabetes mellitus   . TIA (transient ischemic attack)   . Aortic aneurysm, thoracic   . Aortic valve disease     Past Surgical History  Procedure Laterality Date  . Aortic valve replacement      #23 On-X valve conduit  . Back surgery    . Foot surgery    . Achilles tendon repair      Family History  Problem Relation Age of Onset  . Alzheimer's disease Father   . Hypertension Sister   . Hypertension Brother     History  Substance Use Topics  . Smoking status: Never Smoker   . Smokeless tobacco: Not on file  . Alcohol Use: No      Review of Systems  All systems reviewed and negative, other than as noted in HPI.   Allergies  Codeine; Hydrocodone; Hydromorphone; Ivp dye; Lipitor; Other; and Seroquel  Home Medications   Current Outpatient Rx  Name  Route  Sig  Dispense  Refill  . aspirin 81 MG tablet   Oral   Take 81 mg by mouth daily.           . metoprolol (LOPRESSOR) 50 MG tablet   Oral   Take 50 mg by mouth 2 (two) times daily.           Marland Kitchen omeprazole (PRILOSEC) 20 MG capsule   Oral   Take 20 mg by mouth daily.            . Tamsulosin HCl (FLOMAX) 0.4 MG CAPS   Oral   Take by mouth daily.           Marland Kitchen tiotropium (SPIRIVA) 18 MCG inhalation capsule   Inhalation   Place 18 mcg into inhaler and inhale daily.           . Warfarin Sodium (COUMADIN PO)   Oral   Take 7 mg by mouth as directed. 7mg  altern ing with 10 mg as a study with  [facility]           BP 128/78  Pulse 66  Temp(Src) 98.1 F (36.7 C) (Oral)  Resp 14  SpO2 97%  Physical Exam  Nursing note and vitals reviewed. Constitutional: He is oriented to person, place, and time. He appears well-developed and well-nourished. No distress.  HENT:  Head: Normocephalic and atraumatic.  Mouth/Throat: No oropharyngeal exudate.  Eyes: Conjunctivae are normal. Right eye exhibits no discharge. Left eye exhibits no discharge.  Neck: Neck supple.  Cardiovascular: Normal rate, regular rhythm and normal heart sounds.  Exam reveals no gallop and no friction rub.   No murmur heard. Pulmonary/Chest: Effort normal and breath sounds normal. No respiratory distress.  Abdominal: Soft. He exhibits no distension. There is no tenderness.  Musculoskeletal: He exhibits no edema and no tenderness.  Neurological: He is alert and oriented to person, place, and time. No cranial nerve deficit. He exhibits normal muscle tone. Coordination normal.  Speech clear. Content appropriate. Good finger to nose bilaterally. Good heel to shin.  Skin: Skin is warm and dry.  Psychiatric: He has a normal mood and affect. His behavior is normal. Thought content normal.    ED Course  Procedures (including critical care time)  Labs Reviewed  CBC WITH DIFFERENTIAL - Abnormal; Notable for the following:    MCHC 36.5 (*)    All other components within normal limits  COMPREHENSIVE METABOLIC PANEL - Abnormal; Notable for the following:    Glucose, Bld 105 (*)    GFR calc non Af Amer 56 (*)    GFR calc Af Amer 65 (*)    All other components within normal limits   No  results found.  Ct Head Wo Contrast  06/04/2012  *RADIOLOGY REPORT*  Clinical Data: Dizziness, confusion, altered mental status.  CT HEAD WITHOUT CONTRAST  Technique:  Contiguous axial images were obtained from the base of the skull through the vertex without contrast.  Comparison: CT head 03/01/2010.  MRI brain 03/02/2010.  Findings: The ventricles and sulci are symmetrical without significant effacement, displacement, or dilatation. No mass effect or midline shift. No abnormal extra-axial fluid collections. The grey-white matter junction is distinct. Basal cisterns are not effaced. No acute intracranial hemorrhage. No depressed skull fractures.  Visualized paranasal sinuses and mastoid air cells are not opacified.  Stable appearance since previous study.  IMPRESSION: No acute intracranial abnormalities.   Original Report Authenticated By: Burman Nieves, M.D.    Mr Brain Wo Contrast  06/04/2012  *RADIOLOGY REPORT*  Clinical Data:  Stroke.  Dizziness and altered mental status.  MRI HEAD WITHOUT CONTRAST MRA HEAD WITHOUT CONTRAST  Technique:  Multiplanar, multiecho pulse sequences of the brain and surrounding structures were obtained without intravenous contrast. Angiographic images of the head were obtained using MRA technique without contrast.  Comparison:  CT 06/04/2012, MRI 03/02/2010  MRI HEAD  Findings:  Acute infarct right medial thalamus.  No other acute infarct is identified.  Ventricle size is normal.  Chronic microvascular ischemic changes in the white matter are mild. Brainstem is normal. Small chronic infarct right posterior cerebellum.  Negative for hemorrhage.  Negative for mass or edema.  No midline shift.  Mild mucosal thickening in the frontal sinuses.  IMPRESSION: Acute infarct right medial thalamus.  Mild chronic microvascular ischemic change in the white matter.  MRA HEAD  Findings: Both vertebral arteries are patent to the basilar.  PICA is patent bilaterally.  Basilar is widely patent.   Superior cerebellar and posterior cerebral arteries are widely patent.  Internal carotid artery is widely patent bilaterally.  Anterior and middle cerebral arteries are widely patent without stenosis. Negative for cerebral aneurysm.  IMPRESSION: Negative MRA   Original Report Authenticated By: Janeece Riggers, M.D.    Mr Mra Head/brain Wo Cm  06/04/2012  *RADIOLOGY REPORT*  Clinical Data:  Stroke.  Dizziness and altered mental status.  MRI HEAD WITHOUT CONTRAST MRA HEAD WITHOUT CONTRAST  Technique:  Multiplanar, multiecho pulse sequences of the brain and surrounding structures were obtained without intravenous contrast. Angiographic images of the head were obtained using MRA technique without contrast.  Comparison:  CT 06/04/2012, MRI 03/02/2010  MRI HEAD  Findings:  Acute infarct right medial thalamus.  No  other acute infarct is identified.  Ventricle size is normal.  Chronic microvascular ischemic changes in the white matter are mild. Brainstem is normal. Small chronic infarct right posterior cerebellum.  Negative for hemorrhage.  Negative for mass or edema.  No midline shift.  Mild mucosal thickening in the frontal sinuses.  IMPRESSION: Acute infarct right medial thalamus.  Mild chronic microvascular ischemic change in the white matter.  MRA HEAD  Findings: Both vertebral arteries are patent to the basilar.  PICA is patent bilaterally.  Basilar is widely patent.  Superior cerebellar and posterior cerebral arteries are widely patent.  Internal carotid artery is widely patent bilaterally.  Anterior and middle cerebral arteries are widely patent without stenosis. Negative for cerebral aneurysm.  IMPRESSION: Negative MRA   Original Report Authenticated By: Janeece Riggers, M.D.    EKG:  Rhythm: normal sinus Vent. rate 61 BPM PR interval  and I will164 ms QRS duration 96 ms QT/QTc 440/443 ms ST segments: normal   1. TIA (transient ischemic attack)   2. Diabetes mellitus   3. Dizziness   4. HTN (hypertension)    5. Hypercholesterolemia   6. Aortic valve disease   7. Aortic aneurysm, thoracic   8. Dyspnea   9. Lipoma of abdominal wall   10. CVA (cerebral infarction)       MDM  61yM with vague sensation of dizziness and some mild confusion. Concern for possible TIA. Symptoms resolved. Nonfocal neuro exam. CT head w/o acute findings. Will admit for TIA evaluation.        Raeford Razor, MD 06/08/12 [phone]

## 2012-06-04 NOTE — Consult Note (Signed)
Referring Physician: Dr. Benjamine Mola    Chief Complaint: clumsy, woozy  HPI: Alexander Barton is an 62 y.o. male who was at work 06/03/2012 around lunch and began feeling "woozy in his head". No nausea. He says that he felt clumsy all over, not just unilateral. For example he could not clean off his desk the way that his mind told him to do it. He has had a TIA in the past and says that he did have a left facial droop.  He is on coumadin (INR 2.35) for heart valve plus baby aspirin. He checks INR at home and corresponds to [facility].  He is allergic to IV DYE.  Date last known well: 03/06/2012 Time last known well: Unable to determine  tPA Given: No: patient arrived out of window  Past Medical History  Diagnosis Date  . HTN (hypertension)   . Hypercholesterolemia   . COPD (chronic obstructive pulmonary disease)   . GERD (gastroesophageal reflux disease)   . Diabetes mellitus   . TIA (transient ischemic attack)   . Aortic aneurysm, thoracic   . Aortic valve disease     Past Surgical History  Procedure Laterality Date  . Aortic valve replacement      #23 On-X valve conduit  . Back surgery    . Foot surgery    . Achilles tendon repair      Family History  Problem Relation Age of Onset  . Alzheimer's disease Father   . Hypertension Sister   . Hypertension Brother    Social History:  reports that he has never smoked. He does not have any smokeless tobacco history on file. He reports that he does not drink alcohol. His drug history is not on file.  Allergies:  Allergies  Allergen Reactions  . Codeine Anaphylaxis  . Hydrocodone Anaphylaxis  . Hydromorphone Other (See Comments)    Pt says he has never taken  . Ivp Dye (Iodinated Diagnostic Agents) Other (See Comments)    Uncontrollable sneezing, needs sedative the night before and hour and benadryl before using  . Lipitor (Atorvastatin Calcium) Other (See Comments)    Myalgias   . Other     Hydromorphone  . Seroquel  (Quetiapine Fumerate) Other (See Comments)    "bad trip" tongue swell    Current Facility-Administered Medications  Medication Dose Route Frequency Provider Last Rate Last Dose  . aspirin chewable tablet 81 mg  81 mg Oral QPM Hillary Bow, DO      . metoprolol succinate (TOPROL-XL) 24 hr tablet 50 mg  50 mg Oral Daily Hillary Bow, DO   50 mg at 06/04/12 1024  . tamsulosin (FLOMAX) capsule 0.4 mg  0.4 mg Oral QPM Hillary Bow, DO      . tiotropium [facility]) inhalation capsule 18 mcg  18 mcg Inhalation Daily Hillary Bow, DO      . warfarin (COUMADIN) tablet 10 mg  10 mg Oral q1800 Raeford Razor, MD      . Warfarin - Pharmacist Dosing Inpatient   Does not apply [id number] Raeford Razor, MD       ROS: History obtained from chart review  General ROS: negative for - chills, fatigue, fever, night sweats, weight gain or weight loss Psychological ROS: negative for - behavioral disorder, hallucinations, memory difficulties, mood swings or suicidal ideation Ophthalmic ROS: negative for - blurry vision, double vision, eye pain or loss of vision ENT ROS: negative for - epistaxis, nasal discharge, oral lesions, sore throat, tinnitus Allergy  and Immunology ROS: negative for - hives or itchy/watery eyes Hematological and Lymphatic ROS: negative for - bleeding problems, bruising or swollen lymph nodes Endocrine ROS: negative for - galactorrhea, hair pattern changes, polydipsia/polyuria or temperature intolerance Respiratory ROS: negative for - cough, hemoptysis, shortness of breath or wheezing Cardiovascular ROS: negative for - chest pain, dyspnea on exertion, edema or irregular heartbeat Gastrointestinal ROS: negative for - abdominal pain, diarrhea, hematemesis, nausea/vomiting or stool incontinence Genito-Urinary ROS: negative for - dysuria, hematuria, incontinence or urinary frequency/urgency Musculoskeletal ROS: negative for - joint swelling or muscular weakness Neurological ROS: as noted  in HPI Dermatological ROS: negative for rash and skin lesion changes   Physical Examination: Blood pressure 124/75, pulse 74, temperature 98.2 F (36.8 C), temperature source Oral, resp. rate 20, height 6\' 2"  (1.88 m), weight 102.105 kg (225 lb 1.6 oz), SpO2 98.00%.  Neurologic Examination: Mental Status: Alert, oriented, thought content appropriate.  Speech fluent without evidence of aphasia.  Able to follow 3 step commands without difficulty. Cranial Nerves: II: visual fields grossly normal, pupils equal, round, reactive to light and accommodation III,IV, VI: ptosis not present, extra-ocular motions intact bilaterally V,VII: smile asymmetric, he has slight left facial droop more noted on smile than at rest, facial light touch sensation normal bilaterally VIII: hearing normal bilaterally IX,X: gag reflex present XI: trapezius strength/neck flexion strength normal bilaterally XII: tongue strength normal  Motor: Right : Upper extremity   5/5    Left:     Upper extremity   5/5  Lower extremity   5/5     Lower extremity   5/5 Tone and bulk:normal tone throughout; no atrophy noted Sensory: Pinprick and light touch intact throughout, bilaterally Cerebellar: normal finger-to-nose and normal heel-to-shin test    Results for orders placed during the hospital encounter of 06/04/12 (from the past 48 hour(s))  CBC WITH DIFFERENTIAL     Status: Abnormal   Collection Time    06/03/12 10:28 PM      Result Value Range   WBC 7.3  4.0 - 10.5 K/uL   RBC 4.60  4.22 - 5.81 MIL/uL   Hemoglobin 14.5  13.0 - 17.0 g/dL   HCT 56.2  13.0 - 86.5 %   MCV 86.3  78.0 - 100.0 fL   MCH 31.5  26.0 - 34.0 pg   MCHC 36.5 (*) 30.0 - 36.0 g/dL   RDW 78.4  69.6 - 29.5 %   Platelets 194  150 - 400 K/uL   Neutrophils Relative 55  43 - 77 %   Neutro Abs 4.1  1.7 - 7.7 K/uL   Lymphocytes Relative 33  12 - 46 %   Lymphs Abs 2.4  0.7 - 4.0 K/uL   Monocytes Relative 7  3 - 12 %   Monocytes Absolute 0.5  0.1 - 1.0  K/uL   Eosinophils Relative 4  0 - 5 %   Eosinophils Absolute 0.3  0.0 - 0.7 K/uL   Basophils Relative 0  0 - 1 %   Basophils Absolute 0.0  0.0 - 0.1 K/uL  COMPREHENSIVE METABOLIC PANEL     Status: Abnormal   Collection Time    06/03/12 10:28 PM      Result Value Range   Sodium 138  135 - 145 mEq/L   Potassium 4.2  3.5 - 5.1 mEq/L   Chloride 100  96 - 112 mEq/L   CO2 27  19 - 32 mEq/L   Glucose, Bld 105 (*) 70 - 99 mg/dL  BUN 20  6 - 23 mg/dL   Creatinine, Ser 4.09  0.50 - 1.35 mg/dL   Calcium 9.9  8.4 - 81.1 mg/dL   Total Protein 8.1  6.0 - 8.3 g/dL   Albumin 4.3  3.5 - 5.2 g/dL   AST 31  0 - 37 U/L   ALT 26  0 - 53 U/L   Alkaline Phosphatase 66  39 - 117 U/L   Total Bilirubin 0.6  0.3 - 1.2 mg/dL   GFR calc non Af Amer 56 (*) >90 mL/min   GFR calc Af Amer 65 (*) >90 mL/min   Comment:            The eGFR has been calculated     using the CKD EPI equation.     This calculation has not been     validated in all clinical     situations.     eGFR's persistently     <90 mL/min signify     possible Chronic Kidney Disease.  PROTIME-INR     Status: Abnormal   Collection Time    06/04/12 12:19 AM      Result Value Range   Prothrombin Time 24.7 (*) 11.6 - 15.2 seconds   INR 2.35 (*) 0.00 - 1.49  URINALYSIS, ROUTINE W REFLEX MICROSCOPIC     Status: None   Collection Time    06/04/12 12:27 AM      Result Value Range   Color, Urine YELLOW  YELLOW   APPearance CLEAR  CLEAR   Specific Gravity, Urine 1.013  1.005 - 1.030   pH 5.0  5.0 - 8.0   Glucose, UA NEGATIVE  NEGATIVE mg/dL   Hgb urine dipstick NEGATIVE  NEGATIVE   Bilirubin Urine NEGATIVE  NEGATIVE   Ketones, ur NEGATIVE  NEGATIVE mg/dL   Protein, ur NEGATIVE  NEGATIVE mg/dL   Urobilinogen, UA 0.2  0.0 - 1.0 mg/dL   Nitrite NEGATIVE  NEGATIVE   Leukocytes, UA NEGATIVE  NEGATIVE   Comment: MICROSCOPIC NOT DONE ON URINES WITH NEGATIVE PROTEIN, BLOOD, LEUKOCYTES, NITRITE, OR GLUCOSE <1000 mg/dL.  GLUCOSE, CAPILLARY      Status: Abnormal   Collection Time    06/04/12  4:40 AM      Result Value Range   Glucose-Capillary 108 (*) 70 - 99 mg/dL  HEMOGLOBIN B1Y     Status: Abnormal   Collection Time    06/04/12  5:45 AM      Result Value Range   Hemoglobin A1C 5.9 (*) <5.7 %   Comment: (NOTE)                                                                               According to the ADA Clinical Practice Recommendations for 2011, when     HbA1c is used as a screening test:      >=6.5%   Diagnostic of Diabetes Mellitus               (if abnormal result is confirmed)     5.7-6.4%   Increased risk of developing Diabetes Mellitus     References:Diagnosis and Classification of Diabetes Mellitus,Diabetes     Care,2011,34(Suppl 1):S62-S69 and Standards of  Medical Care in             Diabetes - 2011,Diabetes Care,2011,34 (Suppl 1):S11-S61.   Mean Plasma Glucose 123 (*) <117 mg/dL  LIPID PANEL     Status: Abnormal   Collection Time    06/04/12  5:45 AM      Result Value Range   Cholesterol 247 (*) 0 - 200 mg/dL   Triglycerides 621 (*) <150 mg/dL   HDL 45  >30 mg/dL   Total CHOL/HDL Ratio 5.5     VLDL 33  0 - 40 mg/dL   LDL Cholesterol 865 (*) 0 - 99 mg/dL   Comment:            Total Cholesterol/HDL:CHD Risk     Coronary Heart Disease Risk Table                         Men   Women      1/2 Average Risk   3.4   3.3      Average Risk       5.0   4.4      2 X Average Risk   9.6   7.1      3 X Average Risk  23.4   11.0                Use the calculated Patient Ratio     above and the CHD Risk Table     to determine the patient's CHD Risk.                ATP III CLASSIFICATION (LDL):      <100     mg/dL   Optimal      784-696  mg/dL   Near or Above                        Optimal      130-159  mg/dL   Borderline      295-284  mg/dL   High      >132     mg/dL   Very High  GLUCOSE, CAPILLARY     Status: Abnormal   Collection Time    06/04/12  7:31 AM      Result Value Range   Glucose-Capillary 113  (*) 70 - 99 mg/dL   Comment 1 Notify RN    GLUCOSE, CAPILLARY     Status: Abnormal   Collection Time    06/04/12 11:54 AM      Result Value Range   Glucose-Capillary 115 (*) 70 - 99 mg/dL   Comment 1 Notify RN     Ct Head Wo Contrast 06/04/2012 : No acute intracranial abnormalities.      Mr Brain Wo Contrast 06/04/2012   MRI HEAD   Acute infarct right medial thalamus.  Mild chronic microvascular ischemic change in the white matter.   MRA HEAD Negative MRA     Assessment: 62 y.o. male who had transient dizziness and incoordination 06/03/2012 with MRI + right medial thalamic infarct. Hx of TIA with associated left facial droop which is present today. He is on coumadin plus baby aspirin for heart valve.  Stroke Risk Factors - diabetes mellitus, hyperlipidemia and hypertension  Plan: 1. LDL goal < 70 in diabetic patients.  2. Echocardiogram 3. Carotid dopplers 4. Prophylactic therapy-Antiplatelet med: Aspirin - dose 81mg  daily plus coumadin as per pharmacy. 5. Risk factor modification 6. Telemetry monitoring 7.  Frequent neuro checks  Guy Franco PA-C, MBA, MHA [facility] Pager [phone]  I personally participated in this patient's care including breathing above clinical assessment and management recommendations.  Venetia Maxon M.D. [facility] [phone]  06/04/2012, 3:26 PM

## 2012-06-04 NOTE — Progress Notes (Signed)
Patient seen and examined by me.  MRI + for acute CVA- Neuro consulted- work up in progress  No statin as listed as allergy Alexander Barton

## 2012-06-04 NOTE — ED Notes (Signed)
The pt passed his swallow screen ?

## 2012-06-04 NOTE — H&P (Signed)
[facility] History and Physical  Vitali Seibert Towner AVW:[id number] DOB: Apr 23, 1950 DOA: 06/04/2012  Referring physician: ED PCP: Arlan Organ., MD  Specialists: None  Chief Complaint: Dizziness, AMS  HPI: Alexander Barton is a 62 y.o. male who presents with c/o sudden onset of dizziness and difficulty concentrating that started yesterday morning at work.  The symptoms persisted throughout the day but gradually resolved.  At present time he is back to baseline symptoms wise.  While the patient states that he has been stressed recently at work, he presents to the ED because he had symptoms like these previously with a TIA (although that time was different in that he also had aphasia).  In the ED CT scan of his head was negative, INR was therapeutic at 2.3, other lab work was unremarkable.  Hospitalist has been asked to admit for observation.  Review of Systems: 12 systems reviewed and otherwise negative.  Past Medical History  Diagnosis Date  . HTN (hypertension)   . Hypercholesterolemia   . COPD (chronic obstructive pulmonary disease)   . GERD (gastroesophageal reflux disease)   . Diabetes mellitus   . TIA (transient ischemic attack)   . Aortic aneurysm, thoracic   . Aortic valve disease    Past Surgical History  Procedure Laterality Date  . Aortic valve replacement      #23 On-X valve conduit  . Back surgery    . Foot surgery    . Achilles tendon repair     Social History:  reports that he has never smoked. He does not have any smokeless tobacco history on file. He reports that he does not drink alcohol. His drug history is not on file.   Allergies  Allergen Reactions  . Codeine Anaphylaxis  . Hydrocodone Anaphylaxis  . Hydromorphone Other (See Comments)    Pt says he has never taken  . Ivp Dye (Iodinated Diagnostic Agents) Other (See Comments)    Uncontrollable sneezing, needs sedative the night before and hour and benadryl before using  . Lipitor  (Atorvastatin Calcium) Other (See Comments)    Myalgias   . Other     Hydromorphone  . Seroquel (Quetiapine Fumerate) Other (See Comments)    "bad trip" tongue swell    Family History  Problem Relation Age of Onset  . Alzheimer's disease Father   . Hypertension Sister   . Hypertension Brother      Prior to Admission medications   Medication Sig Start Date End Date Taking? Authorizing Provider  aspirin 81 MG tablet Take 81 mg by mouth every evening.    Yes Historical Provider, MD  metFORMIN (GLUCOPHAGE) 500 MG tablet Take 500 mg by mouth daily.   Yes Historical Provider, MD  metoprolol succinate (TOPROL-XL) 50 MG 24 hr tablet Take 50 mg by mouth daily. Take with or immediately following a meal.   Yes Historical Provider, MD  OVER THE COUNTER MEDICATION Take 1 tablet by mouth daily. "Plexus slim"   Yes Historical Provider, MD  Tamsulosin HCl (FLOMAX) 0.4 MG CAPS Take 0.4 mg by mouth every evening.    Yes Historical Provider, MD  tiotropium (SPIRIVA) 18 MCG inhalation capsule Place 18 mcg into inhaler and inhale daily.     Yes Historical Provider, MD  warfarin (COUMADIN) 10 MG tablet Take 10 mg by mouth at bedtime.   Yes Historical Provider, MD   Physical Exam: Filed Vitals:   06/03/12 2216 06/04/12 0104 06/04/12 0258  BP: 128/78 122/73 130/80  Pulse: 66 70 64  Temp: 98.1 F (36.7 C)  98.4 F (36.9 C)  TempSrc: Oral    Resp: 14 16 18   SpO2: 97% 98% 98%     General:  NAD, resting comfortably in bed  Eyes: PEERLA EOMI  ENT: mucous membranes moist  Neck: supple w/o JVD  Cardiovascular: RRR w/o MRG  Respiratory: CTA B  Abdomen: soft, nt, nd, bs+  Skin: no rash nor lesion  Musculoskeletal: MAE, full ROM all 4 extremities  Psychiatric: normal tone and affect  Neurologic: AAOx3, grossly non-focal   Labs on Admission:  Basic Metabolic Panel:  Recent Labs Lab 06/03/12 2228  NA 138  K 4.2  CL 100  CO2 27  GLUCOSE 105*  BUN 20  CREATININE 1.31  CALCIUM  9.9   Liver Function Tests:  Recent Labs Lab 06/03/12 2228  AST 31  ALT 26  ALKPHOS 66  BILITOT 0.6  PROT 8.1  ALBUMIN 4.3   No results found for this basename: LIPASE, AMYLASE,  in the last 168 hours No results found for this basename: AMMONIA,  in the last 168 hours CBC:  Recent Labs Lab 06/03/12 2228  WBC 7.3  NEUTROABS 4.1  HGB 14.5  HCT 39.7  MCV 86.3  PLT 194   Cardiac Enzymes: No results found for this basename: CKTOTAL, CKMB, CKMBINDEX, TROPONINI,  in the last 168 hours  BNP (last 3 results) No results found for this basename: PROBNP,  in the last 8760 hours CBG: No results found for this basename: GLUCAP,  in the last 168 hours  Radiological Exams on Admission: Ct Head Wo Contrast  06/04/2012  *RADIOLOGY REPORT*  Clinical Data: Dizziness, confusion, altered mental status.  CT HEAD WITHOUT CONTRAST  Technique:  Contiguous axial images were obtained from the base of the skull through the vertex without contrast.  Comparison: CT head 03/01/2010.  MRI brain 03/02/2010.  Findings: The ventricles and sulci are symmetrical without significant effacement, displacement, or dilatation. No mass effect or midline shift. No abnormal extra-axial fluid collections. The grey-white matter junction is distinct. Basal cisterns are not effaced. No acute intracranial hemorrhage. No depressed skull fractures.  Visualized paranasal sinuses and mastoid air cells are not opacified.  Stable appearance since previous study.  IMPRESSION: No acute intracranial abnormalities.   Original Report Authenticated By: Burman Nieves, M.D.     EKG: Independently reviewed.  Assessment/Plan Principal Problem:   Dizziness Active Problems:   HTN (hypertension)   Hypercholesterolemia   Diabetes mellitus   Aortic valve disease   1. Dizziness and AMS - symptoms resolved at present time, DDX includes TIA, vs simply stress in the work environment.  Admitting patient to r/o TIA, on stroke pathway with  workup ordered at present time, symptoms do sound very unusual as a presenting complaint for stroke / TIA however.  MRI should make the diagnosis in this case. 2. DM - not taking any home meds so will just monitor with CBG checks 3. HTN - continue home meds 4. Hypercholesterolemia - continue home meds 5. Aortic Valve replacement - continue coumadin, 2d echo ordered to evaluate valve for any signs of thrombus.  No consults called at this time.  Code Status: Full Code (must indicate code status--if unknown or must be presumed, indicate so) Family Communication: Spoke to wife at bedside (indicate person spoken with, if applicable, with phone number if by telephone) Disposition Plan: Admit to obs (indicate anticipated LOS)  Time spent: 50 min  GARDNER, JARED M. [facility] Pager [phone]  If 7PM-7AM,  please contact night-coverage [email or web address] Password [email or web address] 06/04/2012, 3:09 AM

## 2012-06-04 NOTE — Plan of Care (Signed)
Problem: Phase I Progression Outcomes Goal: Antithrombotic given by end of Day 2 Outcome: Completed/Met Date Met:  06/04/12 Coumadin

## 2012-06-04 NOTE — ED Notes (Signed)
The pt returned from xray 

## 2012-06-04 NOTE — Progress Notes (Signed)
Occupational Therapy Discharge Patient Details Name: Alexander Barton MRN: [id number] DOB: April 12, 1950 Today's Date: 06/04/2012 Time: 4098-1191 OT Time Calculation (min): 12 min  Patient discharged from OT services secondary to goals met and no further OT needs identified.  Please see latest therapy progress note for current level of functioning and progress toward goals.    Progress and discharge plan discussed with patient and/or caregiver: Patient/Caregiver agrees with plan  SPOKE with PT [facility]- no acute PT needs noted at this time  GO Functional Assessment Tool Used: clincial judgement Functional Limitation: Self care Self Care Current Status (Y7829): 0 percent impaired, limited or restricted Self Care Goal Status (F6213): 0 percent impaired, limited or restricted Self Care Discharge Status [phone]): 0 percent impaired, limited or restricted   [name] Pager: [phone]  06/04/2012, 2:17 PM

## 2012-06-04 NOTE — Progress Notes (Signed)
Utilization review completed.  

## 2012-06-04 NOTE — ED Notes (Signed)
Report called to [phone]

## 2012-06-04 NOTE — Progress Notes (Signed)
Physical Therapy Screening Patient Details Name: Alexander Barton MRN: [id number] DOB: [birth year] Today's Date: 06/04/2012  PT screening; Pt at baseline per OT Brynn evaluation, No acute PT needs noted at this time. PT will sign off.  Charlotte Crumb, PT DPT  [phone]

## 2012-06-04 NOTE — Progress Notes (Signed)
  Echocardiogram 2D Echocardiogram has been performed.  Alexander Barton 06/04/2012, 3:41 PM

## 2012-06-04 NOTE — Progress Notes (Signed)
ANTICOAGULATION CONSULT NOTE - Initial Consult  Pharmacy Consult for Coumadin Indication: H/o AVR  Allergies  Allergen Reactions  . Codeine Anaphylaxis  . Hydrocodone Anaphylaxis  . Hydromorphone Other (See Comments)    Pt says he has never taken  . Ivp Dye (Iodinated Diagnostic Agents) Other (See Comments)    Uncontrollable sneezing, needs sedative the night before and hour and benadryl before using  . Lipitor (Atorvastatin Calcium) Other (See Comments)    Myalgias   . Other     Hydromorphone  . Seroquel (Quetiapine Fumerate) Other (See Comments)    "bad trip" tongue swell    Patient Measurements:    Vital Signs: Temp: 98.4 F (36.9 C) (05/02 0258) Temp src: Oral (05/01 2216) BP: 130/80 mmHg (05/02 0258) Pulse Rate: 64 (05/02 0258)  Labs:  Recent Labs  06/03/12 2228 06/04/12 0019  HGB 14.5  --   HCT 39.7  --   PLT 194  --   LABPROT  --  24.7*  INR  --  2.35*  CREATININE 1.31  --     The CrCl is unknown because both a height and weight (above a minimum accepted value) are required for this calculation.   Medical History: Past Medical History  Diagnosis Date  . HTN (hypertension)   . Hypercholesterolemia   . COPD (chronic obstructive pulmonary disease)   . GERD (gastroesophageal reflux disease)   . Diabetes mellitus   . TIA (transient ischemic attack)   . Aortic aneurysm, thoracic   . Aortic valve disease     Medications:  Scheduled:  . aspirin  81 mg Oral QPM  . metoprolol succinate  50 mg Oral Daily  . tamsulosin  0.4 mg Oral QPM  . tiotropium  18 mcg Inhalation Daily    Assessment: 62 yo male presented with dizziness and mild confusion. CT of head showed no acute intracranial abnormalities. Patient with h/o AVR and TIA on Coumadin 10mg  daily. INR of 2.35. Pharmacy to manage Coumadin.   Goal of Therapy:  INR 2-3 Monitor platelets by anticoagulation protocol: Yes   Plan:  1. Continue home Coumadin regimen.  2. Daily PT / INR 3. Coumadin  education with pharmacist  Emeline Gins 06/04/2012,3:14 AM

## 2012-06-04 NOTE — Evaluation (Signed)
Occupational Therapy Evaluation Patient Details Name: Alexander Barton MRN: [id number] DOB: October 09, 1950 Today's Date: 06/04/2012 Time: 4098-1191 OT Time Calculation (min): 12 min  OT Assessment / Plan / Recommendation Clinical Impression  62 yo male  + MRI rt medial thalamus and small chronic rt posterior cerebellum infarct. Pt oriented and at baseline. ot to sign off acutely. No follow up.     OT Assessment  Patient does not need any further OT services    Follow Up Recommendations  No OT follow up    Barriers to Discharge      Equipment Recommendations  None recommended by OT    Recommendations for Other Services    Frequency       Precautions / Restrictions Precautions Precautions: None   Pertinent Vitals/Pain None Feels fatigue    ADL  Eating/Feeding: Independent Where Assessed - Eating/Feeding: Edge of bed Grooming: Wash/dry hands;Wash/dry face Where Assessed - Grooming: Unsupported standing Lower Body Dressing: Independent Where Assessed - Lower Body Dressing: Unsupported sit to stand Toilet Transfer: Independent Toilet Transfer Method: Sit to Barista: Regular height toilet Toileting - Clothing Manipulation and Hygiene: Independent Where Assessed - Toileting Clothing Manipulation and Hygiene: Sit to stand from 3-in-1 or toilet Transfers/Ambulation Related to ADLs: Pt ambulating independent with no deficits ADL Comments: Pt is at or near baseline. Pt feels fatigued. Pt educated on the need for down time for brain to heal and rest is the best method. Pt plans to do nothing until monday. Pt educated on the need to reduce stress with personal time.    OT Diagnosis:    OT Problem List:   OT Treatment Interventions:     OT Goals    Visit Information  Last OT Received On: 06/04/12 Assistance Needed: +1    Subjective Data  Subjective: "its a pressure cooker on most days" pt describing work environment to Chief Technology Officer Patient Stated Goal:  to return to work on 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 Communication Communication: No difficulties Dominant Hand: Right         Vision/Perception Vision - History Baseline Vision: Wears glasses all the time Patient Visual Report: No change from baseline   Cognition  Cognition Arousal/Alertness: Awake/alert Behavior During Therapy: WFL for tasks assessed/performed Overall Cognitive Status: Within Functional Limits for tasks assessed    Extremity/Trunk Assessment Right Upper Extremity Assessment RUE ROM/Strength/Tone: Within functional levels RUE Sensation: WFL - Light Touch RUE Coordination: WFL - gross/fine motor Left Upper Extremity Assessment LUE ROM/Strength/Tone: Within functional levels LUE Sensation: WFL - Light Touch LUE Coordination: WFL - gross/fine motor Right Lower Extremity Assessment RLE ROM/Strength/Tone: Within functional levels RLE Sensation: WFL - Light Touch RLE Coordination: WFL - gross/fine motor Left Lower Extremity Assessment LLE ROM/Strength/Tone: Within functional levels LLE Sensation: WFL - Light Touch LLE Coordination: WFL - gross/fine motor Trunk Assessment Trunk Assessment: Normal     Mobility Bed Mobility Bed Mobility: Not assessed Transfers Transfers: Sit to Stand;Stand to Sit Sit to Stand: 7: Independent;From bed Stand to Sit: 7: Independent;To bed     Exercise  Balance Dynamic Gait Index Level Surface: Normal Change in Gait Speed:  Normal Gait with Horizontal Head Turns: Normal Gait with Vertical Head Turns: Normal Gait and Pivot Turn: Normal Step Over Obstacle: Normal Step Around Obstacles: Normal High Level Balance High Level Balance Activites: Side stepping;Direction changes;Backward walking;Turns;Sudden stops;Head turns High Level Balance Comments: Pt able to complete 32 feet gait velocity in 6 seconds   End of Session OT - End of Session Activity Tolerance: Patient tolerated treatment well Patient left: in bed;with call bell/phone within reach Nurse Communication: Mobility status  GO Functional Assessment Tool Used: clincial judgement Functional Limitation: Self care Self Care Current Status (Z6109): 0 percent impaired, limited or restricted Self Care Goal Status (U0454): 0 percent impaired, limited or restricted Self Care Discharge Status (U9811): 0 percent impaired, limited or restricted   [name] 06/04/2012, 2:17 PM Pager: [phone]

## 2012-06-04 NOTE — ED Notes (Signed)
The pt wants to sleep.  nsr on the monitor

## 2012-06-05 DIAGNOSIS — I6350 Cerebral infarction due to unspecified occlusion or stenosis of unspecified cerebral artery: Secondary | ICD-10-CM

## 2012-06-05 DIAGNOSIS — I359 Nonrheumatic aortic valve disorder, unspecified: Secondary | ICD-10-CM

## 2012-06-05 LAB — GLUCOSE, CAPILLARY
Glucose-Capillary: 131 mg/dL — ABNORMAL HIGH (ref 70–99)
Glucose-Capillary: 94 mg/dL (ref 70–99)

## 2012-06-05 LAB — PROTIME-INR
INR: 2.06 — ABNORMAL HIGH (ref 0.00–1.49)
Prothrombin Time: 22.4 seconds — ABNORMAL HIGH (ref 11.6–15.2)

## 2012-06-05 MED ORDER — NON FORMULARY: 10.0000 mg | Freq: Every day | Status: DC

## 2012-06-05 MED ORDER — ROSUVASTATIN CALCIUM 10 MG PO TABS: 10.0000 mg | ORAL_TABLET | Freq: Every day | ORAL | Status: DC

## 2012-06-05 MED ORDER — ROSUVASTATIN CALCIUM 10 MG PO TABS
10.0000 mg | ORAL_TABLET | Freq: Every day | ORAL | Status: DC
  Filled 2012-06-05: qty 1

## 2012-06-05 NOTE — Discharge Summary (Signed)
Physician Discharge Summary  [name] ZOX:[id number] DOB: 12/09/1950 DOA: 06/04/2012  PCP: Arlan Organ., MD  Admit date: 06/04/2012 Discharge date: 06/06/2012  Time spent: 35 minutes  Recommendations for Outpatient Follow-up:  1. LFTs 6 weeks  Discharge Diagnoses:  Principal Problem:   Dizziness Active Problems:   HTN (hypertension)   Hypercholesterolemia   Diabetes mellitus   Aortic valve disease   Discharge Condition: improved  Diet recommendation: cards/diabetic  Filed Weights   06/04/12 0421  Weight: 102.105 kg (225 lb 1.6 oz)    History of present illness:  Alexander Barton is a 62 y.o. male who presents with c/o sudden onset of dizziness and difficulty concentrating that started yesterday morning at work. The symptoms persisted throughout the day but gradually resolved. At present time he is back to baseline symptoms wise. While the patient states that he has been stressed recently at work, he presents to the ED because he had symptoms like these previously with a TIA (although that time was different in that he also had aphasia).  In the ED CT scan of his head was negative, INR was therapeutic at 2.3, other lab work was unremarkable. Hospitalist has been asked to admit for observation.   Hospital Course: CVA: Continue aspirin and coumadin Keep INR between 2.5 and 3.0 if possible  No need for rehab  No alteration in medical therapy other than addition of crestor. Myalgias on lipitor     Procedures:  Echo: Study Conclusions  - Left ventricle: The cavity size was normal. Wall thickness was normal. Systolic function was normal. The estimated ejection fraction was in the range of 55% to 60%. Doppler parameters are consistent with abnormal left ventricular relaxation (grade 1 diastolic dysfunction). - Aortic valve: AV prosthesis opens well Peak and mean gradients through the valve are 14 and9 mm Hg resprectively. - Left atrium: The atrium was  mildly dilated. - Right ventricle: The cavity size was mildly dilated. - Atrial septum: Interatrial septum is hypermobile and aneurysmal, bulging toward R atrium (c/w increased L atrial pressures) No definite PFO/ASD seen. Would need TEE to evaluate further.     Consultations: Neuro   Discharge Exam: Filed Vitals:   06/05/12 0000 06/05/12 0400 06/05/12 0800 06/05/12 0948  BP: 118/79 112/75 111/71 104/71  Pulse: 86 71 67 72  Temp: 97.7 F (36.5 C) 97.6 F (36.4 C) 98.2 F (36.8 C)   TempSrc: Oral Oral Oral   Resp: 20 20 18    Height:      Weight:      SpO2: 96% 97% 97%     General: A+Ox3, NAD Cardiovascular: rrr Respiratory: clear anterior  Discharge Instructions      Discharge Orders   Future Orders Complete By Expires     Diet - low sodium heart healthy  As directed     Diet Carb Modified  As directed     Discharge instructions  As directed     Comments:      LFTs 6 weeks Goal INR 2.5-3 Check INR at home    Increase activity slowly  As directed         Medication List    STOP taking these medications       metFORMIN 500 MG tablet  Commonly known as:  GLUCOPHAGE      TAKE these medications       aspirin 81 MG tablet  Take 81 mg by mouth every evening.     metoprolol succinate 50 MG 24 hr  tablet  Commonly known as:  TOPROL-XL  Take 50 mg by mouth daily. Take with or immediately following a meal.     OVER THE COUNTER MEDICATION  Take 1 tablet by mouth daily. "Plexus slim"     rosuvastatin 10 MG tablet  Commonly known as:  CRESTOR  Take 1 tablet (10 mg total) by mouth daily at 6 PM.     tamsulosin 0.4 MG Caps  Commonly known as:  FLOMAX  Take 0.4 mg by mouth every evening.     tiotropium 18 MCG inhalation capsule  Commonly known as:  SPIRIVA  Place 18 mcg into inhaler and inhale daily.     warfarin 10 MG tablet  Commonly known as:  COUMADIN  Take 10 mg by mouth at bedtime.       Allergies  Allergen Reactions  . Codeine Anaphylaxis   . Hydrocodone Anaphylaxis  . Hydromorphone Other (See Comments)    Pt says he has never taken  . Ivp Dye (Iodinated Diagnostic Agents) Other (See Comments)    Uncontrollable sneezing, needs sedative the night before and hour and benadryl before using  . Lipitor (Atorvastatin Calcium) Other (See Comments)    Myalgias   . Other     Hydromorphone  . Seroquel (Quetiapine Fumerate) Other (See Comments)    "bad trip" tongue swell   Follow-up Information   Follow up with MANNING, Adelene Amas., MD In 1 week.   Contact information:   [address] [place] [address] [phone]        The results of significant diagnostics from this hospitalization (including imaging, microbiology, ancillary and laboratory) are listed below for reference.    Significant Diagnostic Studies: Ct Head Wo Contrast  06/04/2012  *RADIOLOGY REPORT*  Clinical Data: Dizziness, confusion, altered mental status.  CT HEAD WITHOUT CONTRAST  Technique:  Contiguous axial images were obtained from the base of the skull through the vertex without contrast.  Comparison: CT head 03/01/2010.  MRI brain 03/02/2010.  Findings: The ventricles and sulci are symmetrical without significant effacement, displacement, or dilatation. No mass effect or midline shift. No abnormal extra-axial fluid collections. The grey-white matter junction is distinct. Basal cisterns are not effaced. No acute intracranial hemorrhage. No depressed skull fractures.  Visualized paranasal sinuses and mastoid air cells are not opacified.  Stable appearance since previous study.  IMPRESSION: No acute intracranial abnormalities.   Original Report Authenticated By: Burman Nieves, M.D.    Mr Brain Wo Contrast  06/04/2012  *RADIOLOGY REPORT*  Clinical Data:  Stroke.  Dizziness and altered mental status.  MRI HEAD WITHOUT CONTRAST MRA HEAD WITHOUT CONTRAST  Technique:  Multiplanar, multiecho pulse sequences of the brain and surrounding structures were obtained  without intravenous contrast. Angiographic images of the head were obtained using MRA technique without contrast.  Comparison:  CT 06/04/2012, MRI 03/02/2010  MRI HEAD  Findings:  Acute infarct right medial thalamus.  No other acute infarct is identified.  Ventricle size is normal.  Chronic microvascular ischemic changes in the white matter are mild. Brainstem is normal. Small chronic infarct right posterior cerebellum.  Negative for hemorrhage.  Negative for mass or edema.  No midline shift.  Mild mucosal thickening in the frontal sinuses.  IMPRESSION: Acute infarct right medial thalamus.  Mild chronic microvascular ischemic change in the white matter.  MRA HEAD  Findings: Both vertebral arteries are patent to the basilar.  PICA is patent bilaterally.  Basilar is widely patent.  Superior cerebellar and posterior cerebral arteries are  widely patent.  Internal carotid artery is widely patent bilaterally.  Anterior and middle cerebral arteries are widely patent without stenosis. Negative for cerebral aneurysm.  IMPRESSION: Negative MRA   Original Report Authenticated By: Janeece Riggers, M.D.    Mr Mra Head/brain Wo Cm  06/04/2012  *RADIOLOGY REPORT*  Clinical Data:  Stroke.  Dizziness and altered mental status.  MRI HEAD WITHOUT CONTRAST MRA HEAD WITHOUT CONTRAST  Technique:  Multiplanar, multiecho pulse sequences of the brain and surrounding structures were obtained without intravenous contrast. Angiographic images of the head were obtained using MRA technique without contrast.  Comparison:  CT 06/04/2012, MRI 03/02/2010  MRI HEAD  Findings:  Acute infarct right medial thalamus.  No other acute infarct is identified.  Ventricle size is normal.  Chronic microvascular ischemic changes in the white matter are mild. Brainstem is normal. Small chronic infarct right posterior cerebellum.  Negative for hemorrhage.  Negative for mass or edema.  No midline shift.  Mild mucosal thickening in the frontal sinuses.  IMPRESSION:  Acute infarct right medial thalamus.  Mild chronic microvascular ischemic change in the white matter.  MRA HEAD  Findings: Both vertebral arteries are patent to the basilar.  PICA is patent bilaterally.  Basilar is widely patent.  Superior cerebellar and posterior cerebral arteries are widely patent.  Internal carotid artery is widely patent bilaterally.  Anterior and middle cerebral arteries are widely patent without stenosis. Negative for cerebral aneurysm.  IMPRESSION: Negative MRA   Original Report Authenticated By: Janeece Riggers, M.D.     Microbiology: No results found for this or any previous visit (from the past 240 hour(s)).   Labs: Basic Metabolic Panel:  Recent Labs Lab 06/03/12 2228  NA 138  K 4.2  CL 100  CO2 27  GLUCOSE 105*  BUN 20  CREATININE 1.31  CALCIUM 9.9   Liver Function Tests:  Recent Labs Lab 06/03/12 2228  AST 31  ALT 26  ALKPHOS 66  BILITOT 0.6  PROT 8.1  ALBUMIN 4.3   No results found for this basename: LIPASE, AMYLASE,  in the last 168 hours No results found for this basename: AMMONIA,  in the last 168 hours CBC:  Recent Labs Lab 06/03/12 2228  WBC 7.3  NEUTROABS 4.1  HGB 14.5  HCT 39.7  MCV 86.3  PLT 194   Cardiac Enzymes: No results found for this basename: CKTOTAL, CKMB, CKMBINDEX, TROPONINI,  in the last 168 hours BNP: BNP (last 3 results) No results found for this basename: PROBNP,  in the last 8760 hours CBG:  Recent Labs Lab 06/04/12 1154 06/04/12 1646 06/04/12 2117 06/05/12 0742 06/05/12 1113  GLUCAP 115* 99 113* 131* 94       Signed:  Adriona Kaney  [facility] 06/06/2012, 2:32 PM

## 2012-06-05 NOTE — Progress Notes (Signed)
ANTICOAGULATION CONSULT NOTE - Follow Up Consult  Pharmacy Consult for Coumadin Indication: Hx AVR  Allergies  Allergen Reactions  . Codeine Anaphylaxis  . Hydrocodone Anaphylaxis  . Hydromorphone Other (See Comments)    Pt says he has never taken  . Ivp Dye (Iodinated Diagnostic Agents) Other (See Comments)    Uncontrollable sneezing, needs sedative the night before and hour and benadryl before using  . Lipitor (Atorvastatin Calcium) Other (See Comments)    Myalgias   . Other     Hydromorphone  . Seroquel (Quetiapine Fumerate) Other (See Comments)    "bad trip" tongue swell    Patient Measurements: Height: 6\' 2"  (188 cm) Weight: 225 lb 1.6 oz (102.105 kg) IBW/kg (Calculated) : 82.2 Heparin Dosing Weight:   Vital Signs: Temp: 97.6 F (36.4 C) (05/03 0400) Temp src: Oral (05/03 0400) BP: 112/75 mmHg (05/03 0400) Pulse Rate: 71 (05/03 0400)  Labs:  Recent Labs  06/03/12 2228 06/04/12 0019 06/05/12 0618  HGB 14.5  --   --   HCT 39.7  --   --   PLT 194  --   --   LABPROT  --  24.7* 22.4*  INR  --  2.35* 2.06*  CREATININE 1.31  --   --     Estimated Creatinine Clearance: 73.6 ml/min (by C-G formula based on Cr of 1.31).  Assessment: 61yom on Coumadin for AVR. INR (2.06) remains therapeutic - will continue PTA Coumadin regimen (10 mg daily).  - No CBC this AM - No significant bleeding reported  Goal of Therapy:  INR 2-3   Plan:  1. Continue Coumadin 10mg  po daily @ 1800 2. Follow-up AM INR 3. Noted Lipitor allergy (myalgias) - if statin desired due to elevated lipids and acute CVA, consider a trial of Crestor or Pravachol (decreased risk of myalgias)  Cleon Dew [phone] 06/05/2012,7:38 AM

## 2012-06-05 NOTE — Progress Notes (Signed)
UR Completed Ashwath Lasch Graves-Bigelow, RN,BSN [phone]  

## 2012-06-05 NOTE — Progress Notes (Signed)
Stroke Team Progress Note  Alexander Barton is an 62 y.o. male who was at work 06/03/2012 around lunch and began feeling "woozy in his head". No nausea. He says that he felt clumsy all over, not just unilateral. For example he could not clean off his desk the way that his mind told him to do it. He has had a TIA in the past and says that he did have a left facial droop.  He is on coumadin (INR 2.35) for heart valve plus baby aspirin. He checks INR at home and corresponds to [facility].  He is allergic to IV DYE.   Patient was not a TPA candidate secondary to therapeutic INR.  SUBJECTIVE The patient indicates that he feels weak all over, mild headache. No other deficits noted.  OBJECTIVE Most recent Vital Signs: Filed Vitals:   06/05/12 0000 06/05/12 0400 06/05/12 0800 06/05/12 0948  BP: 118/79 112/75 111/71 104/71  Pulse: 86 71 67 72  Temp: 97.7 F (36.5 C) 97.6 F (36.4 C) 98.2 F (36.8 C)   TempSrc: Oral Oral Oral   Resp: 20 20 18    Height:      Weight:      SpO2: 96% 97% 97%    CBG (last 3)   Recent Labs  06/04/12 1646 06/04/12 2117 06/05/12 0742  GLUCAP 99 113* 131*    IV Fluid Intake:     MEDICATIONS  . aspirin  81 mg Oral QPM  . metoprolol succinate  50 mg Oral Daily  . NON FORMULARY 10 mg  10 mg Oral Daily  . tamsulosin  0.4 mg Oral QPM  . tiotropium  18 mcg Inhalation Daily  . warfarin  10 mg Oral q1800  . Warfarin - Pharmacist Dosing Inpatient   Does not apply q1800   PRN:    Diet:  Cardiac in liquids Activity:  Up with assistance DVT Prophylaxis:   Coumadin CLINICALLY SIGNIFICANT STUDIES Basic Metabolic Panel:  Recent Labs Lab 06/03/12 2228  NA 138  K 4.2  CL 100  CO2 27  GLUCOSE 105*  BUN 20  CREATININE 1.31  CALCIUM 9.9   Liver Function Tests:  Recent Labs Lab 06/03/12 2228  AST 31  ALT 26  ALKPHOS 66  BILITOT 0.6  PROT 8.1  ALBUMIN 4.3   CBC:  Recent Labs Lab 06/03/12 2228  WBC 7.3  NEUTROABS 4.1  HGB 14.5  HCT  39.7  MCV 86.3  PLT 194   Coagulation:  Recent Labs Lab 06/04/12 0019 06/05/12 0618  LABPROT 24.7* 22.4*  INR 2.35* 2.06*   Cardiac Enzymes: No results found for this basename: CKTOTAL, CKMB, CKMBINDEX, TROPONINI,  in the last 168 hours Urinalysis:  Recent Labs Lab 06/04/12 0027  COLORURINE YELLOW  LABSPEC 1.013  PHURINE 5.0  GLUCOSEU NEGATIVE  HGBUR NEGATIVE  BILIRUBINUR NEGATIVE  KETONESUR NEGATIVE  PROTEINUR NEGATIVE  UROBILINOGEN 0.2  NITRITE NEGATIVE  LEUKOCYTESUR NEGATIVE   Lipid Panel    Component Value Date/Time   CHOL 247* 06/04/2012 0545   TRIG 165* 06/04/2012 0545   HDL 45 06/04/2012 0545   CHOLHDL 5.5 06/04/2012 0545   VLDL 33 06/04/2012 0545   LDLCALC 169* 06/04/2012 0545   HgbA1C  Lab Results  Component Value Date   HGBA1C 5.9* 06/04/2012    Urine Drug Screen:   No results found for this basename: labopia, cocainscrnur, labbenz, amphetmu, thcu, labbarb    Alcohol Level: No results found for this basename: ETH,  in the last 168 hours  Ct Head Wo Contrast  06/04/2012  *RADIOLOGY REPORT*  Clinical Data: Dizziness, confusion, altered mental status.  CT HEAD WITHOUT CONTRAST  Technique:  Contiguous axial images were obtained from the base of the skull through the vertex without contrast.  Comparison: CT head 03/01/2010.  MRI brain 03/02/2010.  Findings: The ventricles and sulci are symmetrical without significant effacement, displacement, or dilatation. No mass effect or midline shift. No abnormal extra-axial fluid collections. The grey-white matter junction is distinct. Basal cisterns are not effaced. No acute intracranial hemorrhage. No depressed skull fractures.  Visualized paranasal sinuses and mastoid air cells are not opacified.  Stable appearance since previous study.  IMPRESSION: No acute intracranial abnormalities.   Original Report Authenticated By: Burman Nieves, M.D.    Mr Brain Wo Contrast  06/04/2012  *RADIOLOGY REPORT*  Clinical Data:  Stroke.   Dizziness and altered mental status.  MRI HEAD WITHOUT CONTRAST MRA HEAD WITHOUT CONTRAST  Technique:  Multiplanar, multiecho pulse sequences of the brain and surrounding structures were obtained without intravenous contrast. Angiographic images of the head were obtained using MRA technique without contrast.  Comparison:  CT 06/04/2012, MRI 03/02/2010  MRI HEAD  Findings:  Acute infarct right medial thalamus.  No other acute infarct is identified.  Ventricle size is normal.  Chronic microvascular ischemic changes in the white matter are mild. Brainstem is normal. Small chronic infarct right posterior cerebellum.  Negative for hemorrhage.  Negative for mass or edema.  No midline shift.  Mild mucosal thickening in the frontal sinuses.  IMPRESSION: Acute infarct right medial thalamus.  Mild chronic microvascular ischemic change in the white matter.  MRA HEAD  Findings: Both vertebral arteries are patent to the basilar.  PICA is patent bilaterally.  Basilar is widely patent.  Superior cerebellar and posterior cerebral arteries are widely patent.  Internal carotid artery is widely patent bilaterally.  Anterior and middle cerebral arteries are widely patent without stenosis. Negative for cerebral aneurysm.  IMPRESSION: Negative MRA   Original Report Authenticated By: Janeece Riggers, M.D.    Mr Mra Head/brain Wo Cm  06/04/2012  *RADIOLOGY REPORT*  Clinical Data:  Stroke.  Dizziness and altered mental status.  MRI HEAD WITHOUT CONTRAST MRA HEAD WITHOUT CONTRAST  Technique:  Multiplanar, multiecho pulse sequences of the brain and surrounding structures were obtained without intravenous contrast. Angiographic images of the head were obtained using MRA technique without contrast.  Comparison:  CT 06/04/2012, MRI 03/02/2010  MRI HEAD  Findings:  Acute infarct right medial thalamus.  No other acute infarct is identified.  Ventricle size is normal.  Chronic microvascular ischemic changes in the white matter are mild. Brainstem is  normal. Small chronic infarct right posterior cerebellum.  Negative for hemorrhage.  Negative for mass or edema.  No midline shift.  Mild mucosal thickening in the frontal sinuses.  IMPRESSION: Acute infarct right medial thalamus.  Mild chronic microvascular ischemic change in the white matter.  MRA HEAD  Findings: Both vertebral arteries are patent to the basilar.  PICA is patent bilaterally.  Basilar is widely patent.  Superior cerebellar and posterior cerebral arteries are widely patent.  Internal carotid artery is widely patent bilaterally.  Anterior and middle cerebral arteries are widely patent without stenosis. Negative for cerebral aneurysm.  IMPRESSION: Negative MRA   Original Report Authenticated By: Janeece Riggers, M.D.     CT of the brain   IMPRESSION: No acute intracranial abnormalities.  MRI of the brain   IMPRESSION: Acute infarct right medial thalamus. Mild chronic microvascular  ischemic change in the white matter.  MRA of the brain   IMPRESSION: Negative MRA   2D Echocardiogram   Study Conclusions  - Left ventricle: The cavity size was normal. Wall thickness was normal. Systolic function was normal. The estimated ejection fraction was in the range of 55% to 60%. Doppler parameters are consistent with abnormal left ventricular relaxation (grade 1 diastolic dysfunction). - Aortic valve: AV prosthesis opens well Peak and mean gradients through the valve are 14 and9 mm Hg resprectively. - Left atrium: The atrium was mildly dilated. - Right ventricle: The cavity size was mildly dilated. - Atrial septum: Interatrial septum is hypermobile and aneurysmal, bulging toward R atrium   Carotid Doppler   Vascular Ultrasound  Carotid Duplex (Doppler) has been completed.  There is no obvious evidence of hemodynamically significant internal carotid artery stenosis bilaterally. Vertebral arteries are patent with antegrade flow.  CXR    EKG   SINUS RHYTHM ~ normal P axis, V-rate  50- 99 BASELINE WANDER IN LEAD(S) V1  Therapy Recommendations No PT or OT need  Physical Exam  General: The patient is alert and cooperative at the time of the examination.  Skin: No significant peripheral edema is noted.   Neurologic Exam  Cranial nerves: Facial symmetry is present. Speech is normal, no aphasia or dysarthria is noted. Extraocular movements are full. Visual fields are full.  Motor: The patient has good strength in all 4 extremities.  Coordination: The patient has good finger-nose-finger and heel-to-shin bilaterally.  Gait and station: The gait was not tested.  Reflexes: Deep tendon reflexes are symmetric.    ASSESSMENT Alexander Barton is a 62 y.o. male presenting with confusion. Imaging confirms a right thalamic infarct. Infarct felt to be possible small vessel infarct.  On aspirin 81 mg orally every day and warfarin prior to admission. Now on aspirin 81 mg orally every day and warfarin for secondary stroke prevention.   Aortic valve replacement  Atrial septal aneurysm  Dyslipidemia  Hypertension  Diabetes  Hospital day # 1  TREATMENT/PLAN  Continue aspirin and coumadin  Keep INR between 2.5 and 3.0 if possible  No need for rehab  No alteration in medical therapy other than addition of crestor. Myalgias on lipitor  OK for discharge to home soon.   06/05/2012 11:09 AM   Lesly Dukes

## 2012-07-01 ENCOUNTER — Encounter: Payer: Self-pay | Admitting: Physician Assistant

## 2012-07-01 ENCOUNTER — Ambulatory Visit (INDEPENDENT_AMBULATORY_CARE_PROVIDER_SITE_OTHER): Payer: 59 | Admitting: Physician Assistant

## 2012-07-01 VITALS — BP 114/82 | HR 71 | Ht 74.0 in | Wt 221.8 lb

## 2012-07-01 DIAGNOSIS — Z952 Presence of prosthetic heart valve: Secondary | ICD-10-CM

## 2012-07-01 DIAGNOSIS — E785 Hyperlipidemia, unspecified: Secondary | ICD-10-CM

## 2012-07-01 DIAGNOSIS — I359 Nonrheumatic aortic valve disorder, unspecified: Secondary | ICD-10-CM

## 2012-07-01 DIAGNOSIS — I10 Essential (primary) hypertension: Secondary | ICD-10-CM

## 2012-07-01 DIAGNOSIS — J449 Chronic obstructive pulmonary disease, unspecified: Secondary | ICD-10-CM

## 2012-07-01 DIAGNOSIS — Z954 Presence of other heart-valve replacement: Secondary | ICD-10-CM

## 2012-07-01 DIAGNOSIS — Z8673 Personal history of transient ischemic attack (TIA), and cerebral infarction without residual deficits: Secondary | ICD-10-CM

## 2012-07-01 MED ORDER — TAMSULOSIN HCL 0.4 MG PO CAPS: 0.4000 mg | ORAL_CAPSULE | Freq: Every evening | ORAL | Status: DC

## 2012-07-01 MED ORDER — ROSUVASTATIN CALCIUM 10 MG PO TABS: 10.0000 mg | ORAL_TABLET | Freq: Every day | ORAL | Status: DC

## 2012-07-01 NOTE — Progress Notes (Signed)
[address] [place], [place]  [address] Phone: [phone] Fax:  [phone]  Date:  07/01/2012   ID:  Garik, Diamant 01-03-1951, MRN [id number]  PCP:  Arlan Organ., MD  Cardiologist:  Dr. [name] Swaziland     History of Present Illness: Alexander Barton is a 62 y.o. male who returns for follow up after a recent admission to the hospital 5/2-5/4 for an acute right thalamic infarct. He presented with confusion. He was followed by neurology. Recommendation was to continue aspirin and keep INR between 2.5 and 3 while on Coumadin.    He has a history of bicuspid aortic valve with ascending aortic aneurysm. He underwent mechanical aortic valve replacement and aortic root and hemi-arch replacement at [facility] in the past. Last echocardiogram in 2012 demonstrated stable valve function. He was last seen by Dr. Swaziland 12/2010. Other history includes prior TIA, HTN, HL, COPD. He is followed on a regular basis at [facility] for surveillance of his aortic valve. Cardiac catheterization in 2008 demonstrated no significant CAD.  During his hospitalization, echocardiogram 06/03/12: EF 55-60%, grade 1 diastolic dysfunction AVR okay, peak and mean gradient 14 and 9 respectively, mild LAE, mild RVE, inter-atrial septum hypermobile and aneurysmal, bulging toward right atrium, no definite PFO/ASD.  MRA was negative. Carotid Dopplers were negative for ICA stenosis.  Since discharge, he is doing well. Confusion is improved. He still remains weak but this is improving. He denies chest pain, significant changes in his dyspnea, orthopnea, PND or edema. Denies syncope.  Labs (5/14):  K 4.2, creatinine 1.31, ALT 26, LDL 169, Hgb 14.5  Wt Readings from Last 3 Encounters:  07/01/12 221 lb 12.8 oz (100.608 kg)  06/04/12 225 lb 1.6 oz (102.105 kg)  01/14/12 230 lb 6.4 oz (104.509 kg)     Past Medical History  Diagnosis Date  . HTN (hypertension)   . Hypercholesterolemia   . COPD (chronic  obstructive pulmonary disease)   . GERD (gastroesophageal reflux disease)   . Diabetes mellitus   . TIA (transient ischemic attack)   . Aortic aneurysm, thoracic   . Aortic valve disease     Current Outpatient Prescriptions  Medication Sig Dispense Refill  . aspirin 81 MG tablet Take 81 mg by mouth every evening.       . Cinnamon 500 MG TABS Take 1,500 mg by mouth.      . metoprolol succinate (TOPROL-XL) 50 MG 24 hr tablet Take 50 mg by mouth daily. Take with or immediately following a meal.      . OVER THE COUNTER MEDICATION Take 1 tablet by mouth daily. "Plexus slim"      . rosuvastatin (CRESTOR) 10 MG tablet Take 1 tablet (10 mg total) by mouth daily at 6 PM.  30 tablet  0  . Tamsulosin HCl (FLOMAX) 0.4 MG CAPS Take 0.4 mg by mouth every evening.       . tiotropium (SPIRIVA) 18 MCG inhalation capsule Place 18 mcg into inhaler and inhale as needed.       . warfarin (COUMADIN) 10 MG tablet Take 10 mg by mouth at bedtime.       No current facility-administered medications for this visit.    Allergies:    Allergies  Allergen Reactions  . Codeine Anaphylaxis  . Hydrocodone Anaphylaxis  . Hydromorphone Other (See Comments)    Pt says he has never taken  . Ivp Dye (Iodinated Diagnostic Agents) Other (See Comments)    Uncontrollable sneezing,  needs sedative the night before and hour and benadryl before using  . Lipitor (Atorvastatin Calcium) Other (See Comments)    Myalgias   . Other     Hydromorphone  . Seroquel (Quetiapine Fumerate) Other (See Comments)    "bad trip" tongue swell    Social History:  The patient  reports that he has never smoked. He does not have any smokeless tobacco history on file. He reports that he does not drink alcohol.   ROS:  Please see the history of present illness.      All other systems reviewed and negative.   PHYSICAL EXAM: VS:  BP 114/82  Pulse 71  Ht 6\' 2"  (1.88 m)  Wt 221 lb 12.8 oz (100.608 kg)  BMI 28.47 kg/m2 Well nourished, well  developed, in no acute distress HEENT: normal Neck: no JVD Cardiac:  normal S1, mechanical S2; RRR; no murmur Lungs:  clear to auscultation bilaterally, no wheezing, rhonchi or rales Abd: soft, nontender, no hepatomegaly Ext: no edema Skin: warm and dry Neuro:  CNs 2-12 intact, no focal abnormalities noted  EKG:  NSR, HR 71, normal axis, nonspecific ST-T wave changes     ASSESSMENT AND PLAN:  1. Right Thalamic Stroke:  Improved. Continue followup with primary care. He remains on aspirin and Coumadin. 2. Status Post Aortic Valve Replacement:  AVR stable by recent echocardiogram. Continue SBE prophylaxis. I will send a copy of his echocardiogram to his cardiologist at [facility]. 3. Hyperlipidemia: Continue Crestor. Check a followup lipid and LFT panel in 2 months. 4. Hypertension:  Controlled.  Continue current therapy.  5. COPD: Dyspnea remains stable. He admits to not taking his Spiriva as he should. He plans to gradually increase activity again which has been somewhat sparse since TIA 2 years ago. 6. Disposition:  F/u with Dr. [name] Swaziland in 3 mos.   Signed, Tereso Newcomer, PA-C  07/01/2012 10:20 AM

## 2012-07-01 NOTE — Patient Instructions (Addendum)
A REFILL FOR CRESTOR WAS SENT TO [facility]  A ONE TIME REFILL FOR FLOMAX WAS SENT ALSO TO [facility] .  FASTING LIPID AND LIVER PANEL TO BE DONE 09/03/12 PLEASE FOLLOW UP WITH DR. Swaziland

## 2012-07-26 ENCOUNTER — Other Ambulatory Visit: Payer: Self-pay | Admitting: *Deleted

## 2012-07-26 DIAGNOSIS — E785 Hyperlipidemia, unspecified: Secondary | ICD-10-CM

## 2012-07-26 MED ORDER — ROSUVASTATIN CALCIUM 10 MG PO TABS: 10.0000 mg | ORAL_TABLET | Freq: Every day | ORAL | Status: DC

## 2012-07-27 ENCOUNTER — Telehealth: Payer: Self-pay | Admitting: Physician Assistant

## 2012-07-27 NOTE — Telephone Encounter (Signed)
F/u ° ° °Pt returning your call °

## 2012-07-27 NOTE — Telephone Encounter (Signed)
Spoke with patient who is checking on Crestor Rx that was supposed to be sent to [facility].  I advised patient that the Rx was sent by Micki Riley, CMA yesterday (6/24) at 5:33 pm.  Patient verbalized understanding and will call back if there is any further problem.

## 2012-07-27 NOTE — Telephone Encounter (Signed)
LMTCB

## 2012-07-27 NOTE — Telephone Encounter (Signed)
New problem   Pt is calling in concern about a prescription for Crestor 10mg  that Lorin Picket was suppose to send to the pharmacy for him. He called [facility] and they haven't received prescription yet. Please call pt.

## 2012-09-03 ENCOUNTER — Other Ambulatory Visit: Payer: 59

## 2012-09-29 ENCOUNTER — Ambulatory Visit (INDEPENDENT_AMBULATORY_CARE_PROVIDER_SITE_OTHER): Payer: 59 | Admitting: Cardiology

## 2012-09-29 ENCOUNTER — Encounter: Payer: Self-pay | Admitting: Cardiology

## 2012-09-29 VITALS — BP 112/70 | HR 65 | Ht 74.0 in | Wt 225.1 lb

## 2012-09-29 DIAGNOSIS — I639 Cerebral infarction, unspecified: Secondary | ICD-10-CM

## 2012-09-29 DIAGNOSIS — I6350 Cerebral infarction due to unspecified occlusion or stenosis of unspecified cerebral artery: Secondary | ICD-10-CM

## 2012-09-29 DIAGNOSIS — E119 Type 2 diabetes mellitus without complications: Secondary | ICD-10-CM

## 2012-09-29 DIAGNOSIS — I10 Essential (primary) hypertension: Secondary | ICD-10-CM

## 2012-09-29 DIAGNOSIS — I359 Nonrheumatic aortic valve disorder, unspecified: Secondary | ICD-10-CM

## 2012-09-29 MED ORDER — METOPROLOL SUCCINATE ER 50 MG PO TB24: 50.0000 mg | ORAL_TABLET | Freq: Every day | ORAL | Status: DC

## 2012-09-29 NOTE — Progress Notes (Signed)
Alexander Barton Date of Birth: 1950/08/22 Medical Record #[id number]  History of Present Illness: Alexander Barton is seen today for followup. He is status post mechanical aortic valve replacement and is on chronic Coumadin. He had a TIA in January 2012 and was admitted in May of this year with a right thalamic CVA. He is now on aspirin and Coumadin. He reports his INRs have been therapeutic. He still has some memory issues related to his stroke. He also reports that he tore his left biceps at the end of May and had significant bruising in this area that has since resolved. He is not planning to have surgery on this. He denies any chest pain or shortness of breath. He mentioned an herbal supplement that he had read about reducing the risk of stroke and he was going to send me a copy of this. His evaluation in the hospital included an echocardiogram which showed normal valve function. Carotid Dopplers were OK.  Current Outpatient Prescriptions on File Prior to Visit  Medication Sig Dispense Refill  . aspirin 81 MG tablet Take 81 mg by mouth every evening.       . Cinnamon 500 MG TABS Take 2,000 mg by mouth.       Marland Kitchen OVER THE COUNTER MEDICATION Take 1 tablet by mouth daily. "Plexus slim"      . rosuvastatin (CRESTOR) 10 MG tablet Take 1 tablet (10 mg total) by mouth at bedtime.  90 tablet  1  . tamsulosin (FLOMAX) 0.4 MG CAPS Take 1 capsule (0.4 mg total) by mouth every evening.  90 capsule  0  . tiotropium (SPIRIVA) 18 MCG inhalation capsule Place 18 mcg into inhaler and inhale as needed.       . warfarin (COUMADIN) 10 MG tablet Take 10 mg by mouth at bedtime.       No current facility-administered medications on file prior to visit.    Allergies  Allergen Reactions  . Codeine Anaphylaxis  . Hydrocodone Anaphylaxis  . Hydromorphone Other (See Comments)    Pt says he has never taken  . Ivp Dye [Iodinated Diagnostic Agents] Other (See Comments)    Uncontrollable sneezing, needs sedative the  night before and hour and benadryl before using  . Lipitor [Atorvastatin Calcium] Other (See Comments)    Myalgias   . Other     Hydromorphone  . Seroquel [Quetiapine Fumerate] Other (See Comments)    "bad trip" tongue swell    Past Medical History  Diagnosis Date  . HTN (hypertension)   . Hypercholesterolemia   . COPD (chronic obstructive pulmonary disease)   . GERD (gastroesophageal reflux disease)   . Diabetes mellitus   . TIA (transient ischemic attack)   . Aortic aneurysm, thoracic   . Aortic valve disease     Past Surgical History  Procedure Laterality Date  . Aortic valve replacement      #23 On-X valve conduit  . Back surgery    . Foot surgery    . Achilles tendon repair      History  Smoking status  . Never Smoker   Smokeless tobacco  . Not on file    History  Alcohol Use No    Family History  Problem Relation Age of Onset  . Alzheimer's disease Father   . Hypertension Sister   . Hypertension Brother     Review of Systems: As noted in history of present illness.  All other systems were reviewed and are negative.  Physical Exam:  BP 112/70  Pulse 65  Ht 6\' 2"  (1.88 m)  Wt 225 lb 1.9 oz (102.114 kg)  BMI 28.89 kg/m2 He is a pleasant white male in no acute distress. HEENT exam is normal. Neck is supple no JVD, adenopathy, thyromegaly, or bruits. Lungs are clear. Cardiac exam reveals a good mechanical aortic valve click. His rhythm is regular. He has no murmur or rub. PMI is normal. Abdomen is soft nontender without masses or bruits. Extremities are without cyanosis or edema. Pulses are 2+ and symmetric. Skin is warm and dry. He is alert and oriented x3. Cranial nerves II through XII are intact. He has no focal motor or sensory deficits. LABORATORY DATA:   Assessment / Plan:  1. Status post mechanical aortic valve replacement. Continue anticoagulation with Coumadin. 2. Hypertension-controlled 3. Status post CVA/right thalamic. Continue combined  aspirin and Coumadin therapy. Continue to focus on blood pressure control and statin therapy.

## 2012-09-29 NOTE — Patient Instructions (Signed)
Your need to get more aerobic exercise   Continue your current therapy and heart healthy diet.  I will see you in 6 months.

## 2013-06-02 DIAGNOSIS — I639 Cerebral infarction, unspecified: Secondary | ICD-10-CM | POA: Insufficient documentation

## 2013-06-02 DIAGNOSIS — E119 Type 2 diabetes mellitus without complications: Secondary | ICD-10-CM | POA: Insufficient documentation

## 2013-06-02 DIAGNOSIS — I69319 Unspecified symptoms and signs involving cognitive functions following cerebral infarction: Secondary | ICD-10-CM | POA: Insufficient documentation

## 2013-08-01 ENCOUNTER — Encounter: Payer: Self-pay | Admitting: Family Medicine

## 2013-10-13 LAB — POCT INR
INR: 2.9
INR: 2.9

## 2013-10-28 ENCOUNTER — Ambulatory Visit (INDEPENDENT_AMBULATORY_CARE_PROVIDER_SITE_OTHER): Payer: BC Managed Care – PPO | Admitting: Pharmacist Clinician (PhC)/ Clinical Pharmacy Specialist

## 2013-10-28 DIAGNOSIS — I359 Nonrheumatic aortic valve disorder, unspecified: Secondary | ICD-10-CM

## 2013-10-28 DIAGNOSIS — Z7901 Long term (current) use of anticoagulants: Secondary | ICD-10-CM

## 2013-10-29 DIAGNOSIS — Z7901 Long term (current) use of anticoagulants: Secondary | ICD-10-CM | POA: Insufficient documentation

## 2013-10-29 NOTE — Progress Notes (Signed)
Pt presents to office today for establishing in coumadin clinic.  Alexander Barton had an AVR in 2008 at [facility] and was subsequently enrolled in a trial for home meter usage.  The study ended recently after 7 years, during most of that time pt was self adjusting and verifying changes with clinic at [facility].   His first INR goal range was from 1.5-2.0, looking at safety of low range INR goals.  He states that since then he has suffered from 2 mini-strokes back in 2011-2012, then a full stroke in May of 2014.  His current goal INR is 2.5-3.0.  He was told at study end he could drop back to original INR goal, but since the strokes, he would prefer to stay at 2.5-3.0 and I agree with that choice.  He states he has been quite stable on his current dose of 5mg  qod/7mg  qod for some time.  His monitor (INRatio) was supplied by [employer], and now is his to keep. We will set him up with a prescription thru [employer] to continue to recevie test strips and supplies.  He is to call INRs to our office, along with any changes to medication.  We will monitor results and help make any changes needed.    Prescription signed by Dr. [name] today, will fax to [facility].  Pt aware that he may now have to pay for supplies, if becomes cost-prohibitive he is to call and we will enroll him in office INR testing.  Pt agreeable to plan.    Will contact [name] at [facility] regarding last few INRs and patient compliance with program.  [phone] or [phone])

## 2013-11-03 LAB — POCT INR: INR: 1.8

## 2013-11-04 ENCOUNTER — Ambulatory Visit (INDEPENDENT_AMBULATORY_CARE_PROVIDER_SITE_OTHER): Payer: BC Managed Care – PPO | Admitting: Pharmacist Clinician (PhC)/ Clinical Pharmacy Specialist

## 2013-11-04 DIAGNOSIS — I359 Nonrheumatic aortic valve disorder, unspecified: Secondary | ICD-10-CM

## 2013-11-04 DIAGNOSIS — G459 Transient cerebral ischemic attack, unspecified: Secondary | ICD-10-CM

## 2013-11-11 ENCOUNTER — Encounter: Payer: Self-pay | Admitting: Cardiology

## 2013-11-19 LAB — POCT INR: INR: 2.1

## 2013-11-21 ENCOUNTER — Ambulatory Visit (INDEPENDENT_AMBULATORY_CARE_PROVIDER_SITE_OTHER): Payer: BC Managed Care – PPO | Admitting: Pharmacist Clinician (PhC)/ Clinical Pharmacy Specialist

## 2013-11-21 DIAGNOSIS — I359 Nonrheumatic aortic valve disorder, unspecified: Secondary | ICD-10-CM

## 2013-11-21 DIAGNOSIS — G459 Transient cerebral ischemic attack, unspecified: Secondary | ICD-10-CM

## 2013-11-29 ENCOUNTER — Ambulatory Visit: Payer: 59 | Admitting: Cardiology

## 2013-12-02 ENCOUNTER — Ambulatory Visit: Payer: 59 | Admitting: Cardiology

## 2013-12-07 ENCOUNTER — Other Ambulatory Visit: Payer: Self-pay | Admitting: Pharmacist Clinician (PhC)/ Clinical Pharmacy Specialist

## 2013-12-07 MED ORDER — WARFARIN SODIUM 5 MG PO TABS: ORAL_TABLET | ORAL | Status: DC

## 2013-12-12 ENCOUNTER — Other Ambulatory Visit: Payer: Self-pay | Admitting: Pharmacist Clinician (PhC)/ Clinical Pharmacy Specialist

## 2013-12-12 MED ORDER — WARFARIN SODIUM 2 MG PO TABS: ORAL_TABLET | ORAL | Status: DC

## 2013-12-20 LAB — POCT INR: INR: 1.5

## 2013-12-21 ENCOUNTER — Ambulatory Visit (INDEPENDENT_AMBULATORY_CARE_PROVIDER_SITE_OTHER): Payer: BC Managed Care – PPO | Admitting: Pharmacist Clinician (PhC)/ Clinical Pharmacy Specialist

## 2013-12-21 DIAGNOSIS — I359 Nonrheumatic aortic valve disorder, unspecified: Secondary | ICD-10-CM

## 2013-12-21 DIAGNOSIS — G459 Transient cerebral ischemic attack, unspecified: Secondary | ICD-10-CM

## 2014-01-03 ENCOUNTER — Ambulatory Visit (INDEPENDENT_AMBULATORY_CARE_PROVIDER_SITE_OTHER): Payer: BC Managed Care – PPO | Admitting: Pharmacist Clinician (PhC)/ Clinical Pharmacy Specialist

## 2014-01-03 DIAGNOSIS — G459 Transient cerebral ischemic attack, unspecified: Secondary | ICD-10-CM

## 2014-01-03 DIAGNOSIS — I359 Nonrheumatic aortic valve disorder, unspecified: Secondary | ICD-10-CM

## 2014-01-03 LAB — POCT INR: INR: 2.2

## 2014-02-08 ENCOUNTER — Ambulatory Visit (INDEPENDENT_AMBULATORY_CARE_PROVIDER_SITE_OTHER): Payer: Self-pay | Admitting: Pharmacist Clinician (PhC)/ Clinical Pharmacy Specialist

## 2014-02-08 DIAGNOSIS — G459 Transient cerebral ischemic attack, unspecified: Secondary | ICD-10-CM

## 2014-02-08 DIAGNOSIS — I359 Nonrheumatic aortic valve disorder, unspecified: Secondary | ICD-10-CM

## 2014-02-08 LAB — POCT INR: INR: 1.8

## 2014-02-09 ENCOUNTER — Encounter: Payer: Self-pay | Admitting: Cardiology

## 2014-02-09 ENCOUNTER — Ambulatory Visit (INDEPENDENT_AMBULATORY_CARE_PROVIDER_SITE_OTHER): Payer: BLUE CROSS/BLUE SHIELD | Admitting: Cardiology

## 2014-02-09 VITALS — BP 104/70 | HR 67 | Ht 74.0 in | Wt 223.1 lb

## 2014-02-09 DIAGNOSIS — E119 Type 2 diabetes mellitus without complications: Secondary | ICD-10-CM

## 2014-02-09 DIAGNOSIS — I10 Essential (primary) hypertension: Secondary | ICD-10-CM

## 2014-02-09 DIAGNOSIS — Z7901 Long term (current) use of anticoagulants: Secondary | ICD-10-CM

## 2014-02-09 DIAGNOSIS — I359 Nonrheumatic aortic valve disorder, unspecified: Secondary | ICD-10-CM

## 2014-02-09 DIAGNOSIS — E7800 Pure hypercholesterolemia, unspecified: Secondary | ICD-10-CM

## 2014-02-09 MED ORDER — ROSUVASTATIN CALCIUM 10 MG PO TABS: 10.0000 mg | ORAL_TABLET | Freq: Every day | ORAL | Status: DC

## 2014-02-09 NOTE — Progress Notes (Signed)
Alexander Barton Date of Birth: 04-13-1950 Medical Record #[id number]  History of Present Illness: Mr. Steeves is seen today for followup of valvular heart disease. He is status post mechanical aortic valve replacement and is on chronic Coumadin. He had a TIA in January 2012 and was admitted in May 2014 with a right thalamic CVA. He is now on aspirin and Coumadin.  His evaluation in the hospital included an echocardiogram which showed normal valve function. Carotid Dopplers were OK. On follow up today he reports feeling well. No chest pain. Only has dyspnea if he is walking up an extended incline. We reviewed his lab work from April 2015 which showed a cholesterol of 251 and LDL of 176. He states he does not take Crestor regularly. He is concerned about statin side effects. He took lipitor in the past and noted a "bulge" in his RUQ. MRI was negative and he is convinced this was related to lipitor.  Current Outpatient Prescriptions on File Prior to Visit  Medication Sig Dispense Refill  . aspirin 81 MG tablet Take 81 mg by mouth every evening.     . Cinnamon 500 MG TABS Take 2,000 mg by mouth.     . metoprolol succinate (TOPROL-XL) 50 MG 24 hr tablet Take 1 tablet (50 mg total) by mouth daily. Take with or immediately following a meal. 90 tablet 3  . OVER THE COUNTER MEDICATION Take 1 tablet by mouth daily. "Plexus slim"    . tamsulosin (FLOMAX) 0.4 MG CAPS Take 1 capsule (0.4 mg total) by mouth every evening. 90 capsule 0  . tiotropium (SPIRIVA) 18 MCG inhalation capsule Place 18 mcg into inhaler and inhale as needed.     . warfarin (COUMADIN) 2 MG tablet Take 1 tablet with 5mg  tablet daily as directed by coumadin clinic 90 tablet 1  . warfarin (COUMADIN) 5 MG tablet Take 1 tablet by mouth daily with 2mg  tablets as directed by coumadin clinic 90 tablet 1   No current facility-administered medications on file prior to visit.    Allergies  Allergen Reactions  . Codeine Anaphylaxis  .  Hydrocodone Anaphylaxis  . Hydromorphone Other (See Comments)    Pt says he has never taken  . Ivp Dye [Iodinated Diagnostic Agents] Other (See Comments)    Uncontrollable sneezing, needs sedative the night before and hour and benadryl before using  . Lipitor [Atorvastatin Calcium] Other (See Comments)    Myalgias   . Other     Hydromorphone  . Seroquel [Quetiapine Fumerate] Other (See Comments)    "bad trip" tongue swell    Past Medical History  Diagnosis Date  . HTN (hypertension)   . Hypercholesterolemia   . COPD (chronic obstructive pulmonary disease)   . GERD (gastroesophageal reflux disease)   . Diabetes mellitus   . TIA (transient ischemic attack)   . Aortic aneurysm, thoracic   . Aortic valve disease     Past Surgical History  Procedure Laterality Date  . Aortic valve replacement      #23 On-X valve conduit  . Back surgery    . Foot surgery    . Achilles tendon repair      History  Smoking status  . Never Smoker   Smokeless tobacco  . Not on file    History  Alcohol Use No    Family History  Problem Relation Age of Onset  . Alzheimer's disease Father   . Hypertension Sister   . Hypertension Brother  Review of Systems: As noted in history of present illness.  All other systems were reviewed and are negative.  Physical Exam: BP 104/70 mmHg  Pulse 67  Ht 6\' 2"  (1.88 m)  Wt 223 lb 1.6 oz (101.197 kg)  BMI 28.63 kg/m2 He is a pleasant white male in no acute distress. HEENT exam is normal. Neck is supple no JVD, adenopathy, thyromegaly, or bruits. Lungs are clear. Cardiac exam reveals a good mechanical aortic valve click. His rhythm is regular. He has no murmur or rub. PMI is normal. Abdomen is soft nontender without masses or bruits. Extremities are without cyanosis or edema. Pulses are 2+ and symmetric. Skin is warm and dry. He is alert and oriented x3. Cranial nerves II through XII are intact. He has no focal motor or sensory deficits. LABORATORY  DATA: Ecg today shows NSR with occ. PACs. Otherwise normal. I have personally reviewed and interpreted this study.   Assessment / Plan: 1. Status post mechanical aortic valve replacement. Continue anticoagulation with Coumadin. Echo in 2014 showed normal valve function. Valve exam is normal.  2. Hypertension-controlled 3. Status post CVA/right thalamic. Continue combined aspirin and Coumadin therapy. Continue to focus on blood pressure control and statin therapy. 4. Hypercholesterolemia. Given history of recurrent stroke I have strongly recommended he take his statin with goal LDL of at least 100 and preferably 70.

## 2014-02-09 NOTE — Patient Instructions (Addendum)
Continue your current therapy  Increase your aerobic activity  I would encourage you to take a statin ( Crestor )  I will see you in 6 months

## 2014-02-23 ENCOUNTER — Ambulatory Visit (INDEPENDENT_AMBULATORY_CARE_PROVIDER_SITE_OTHER): Payer: BLUE CROSS/BLUE SHIELD | Admitting: Pharmacist Clinician (PhC)/ Clinical Pharmacy Specialist

## 2014-02-23 DIAGNOSIS — Z7901 Long term (current) use of anticoagulants: Secondary | ICD-10-CM

## 2014-02-23 DIAGNOSIS — G459 Transient cerebral ischemic attack, unspecified: Secondary | ICD-10-CM

## 2014-02-23 DIAGNOSIS — I359 Nonrheumatic aortic valve disorder, unspecified: Secondary | ICD-10-CM

## 2014-02-23 LAB — POCT INR: INR: 3

## 2014-03-07 LAB — POCT INR: INR: 3.1

## 2014-03-13 ENCOUNTER — Ambulatory Visit (INDEPENDENT_AMBULATORY_CARE_PROVIDER_SITE_OTHER): Payer: BLUE CROSS/BLUE SHIELD | Admitting: Pharmacist Clinician (PhC)/ Clinical Pharmacy Specialist

## 2014-03-13 DIAGNOSIS — I359 Nonrheumatic aortic valve disorder, unspecified: Secondary | ICD-10-CM

## 2014-03-13 DIAGNOSIS — Z7901 Long term (current) use of anticoagulants: Secondary | ICD-10-CM

## 2014-03-13 DIAGNOSIS — G459 Transient cerebral ischemic attack, unspecified: Secondary | ICD-10-CM

## 2014-04-15 LAB — PROTIME-INR: INR: 3.1 — AB (ref 0.9–1.1)

## 2014-04-16 LAB — POCT INR: INR: 3.1

## 2014-04-17 ENCOUNTER — Ambulatory Visit (INDEPENDENT_AMBULATORY_CARE_PROVIDER_SITE_OTHER): Payer: BLUE CROSS/BLUE SHIELD | Admitting: Pharmacist Clinician (PhC)/ Clinical Pharmacy Specialist

## 2014-04-17 DIAGNOSIS — Z7901 Long term (current) use of anticoagulants: Secondary | ICD-10-CM

## 2014-04-17 DIAGNOSIS — G459 Transient cerebral ischemic attack, unspecified: Secondary | ICD-10-CM

## 2014-04-17 DIAGNOSIS — I359 Nonrheumatic aortic valve disorder, unspecified: Secondary | ICD-10-CM

## 2014-05-06 LAB — PROTIME-INR

## 2014-05-06 LAB — POCT INR: INR: 2.7

## 2014-05-08 ENCOUNTER — Ambulatory Visit (INDEPENDENT_AMBULATORY_CARE_PROVIDER_SITE_OTHER): Payer: BLUE CROSS/BLUE SHIELD | Admitting: Pharmacist Clinician (PhC)/ Clinical Pharmacy Specialist

## 2014-05-08 DIAGNOSIS — I359 Nonrheumatic aortic valve disorder, unspecified: Secondary | ICD-10-CM

## 2014-05-08 DIAGNOSIS — Z7901 Long term (current) use of anticoagulants: Secondary | ICD-10-CM

## 2014-05-08 DIAGNOSIS — G459 Transient cerebral ischemic attack, unspecified: Secondary | ICD-10-CM

## 2014-05-27 ENCOUNTER — Other Ambulatory Visit: Payer: Self-pay | Admitting: Pharmacist Clinician (PhC)/ Clinical Pharmacy Specialist

## 2014-05-27 ENCOUNTER — Other Ambulatory Visit: Payer: Self-pay | Admitting: Cardiology

## 2014-06-04 LAB — POCT INR: INR: 2.4

## 2014-06-05 ENCOUNTER — Ambulatory Visit (INDEPENDENT_AMBULATORY_CARE_PROVIDER_SITE_OTHER): Payer: BLUE CROSS/BLUE SHIELD | Admitting: Pharmacist Clinician (PhC)/ Clinical Pharmacy Specialist

## 2014-06-05 DIAGNOSIS — I359 Nonrheumatic aortic valve disorder, unspecified: Secondary | ICD-10-CM

## 2014-06-05 DIAGNOSIS — G459 Transient cerebral ischemic attack, unspecified: Secondary | ICD-10-CM

## 2014-06-05 DIAGNOSIS — Z7901 Long term (current) use of anticoagulants: Secondary | ICD-10-CM

## 2014-06-24 LAB — POCT INR: INR: 1.6

## 2014-06-27 ENCOUNTER — Encounter: Payer: Self-pay | Admitting: Internal Medicine

## 2014-06-28 ENCOUNTER — Ambulatory Visit (INDEPENDENT_AMBULATORY_CARE_PROVIDER_SITE_OTHER): Payer: BLUE CROSS/BLUE SHIELD | Admitting: Pharmacist Clinician (PhC)/ Clinical Pharmacy Specialist

## 2014-06-28 DIAGNOSIS — I359 Nonrheumatic aortic valve disorder, unspecified: Secondary | ICD-10-CM

## 2014-06-28 DIAGNOSIS — G459 Transient cerebral ischemic attack, unspecified: Secondary | ICD-10-CM

## 2014-06-28 DIAGNOSIS — Z7901 Long term (current) use of anticoagulants: Secondary | ICD-10-CM

## 2014-07-08 LAB — POCT INR: INR: 2.5

## 2014-07-08 LAB — PROTIME-INR: INR: 2.5 — AB (ref 0.9–1.1)

## 2014-07-11 ENCOUNTER — Ambulatory Visit (INDEPENDENT_AMBULATORY_CARE_PROVIDER_SITE_OTHER): Payer: BLUE CROSS/BLUE SHIELD | Admitting: Pharmacist Clinician (PhC)/ Clinical Pharmacy Specialist

## 2014-07-11 DIAGNOSIS — I359 Nonrheumatic aortic valve disorder, unspecified: Secondary | ICD-10-CM

## 2014-07-11 DIAGNOSIS — Z7901 Long term (current) use of anticoagulants: Secondary | ICD-10-CM

## 2014-07-11 DIAGNOSIS — G459 Transient cerebral ischemic attack, unspecified: Secondary | ICD-10-CM

## 2014-07-21 ENCOUNTER — Telehealth: Payer: Self-pay | Admitting: Cardiology

## 2014-07-21 NOTE — Telephone Encounter (Signed)
Left message for krystle, we have the paperwork to show to dr [name] today.

## 2014-07-21 NOTE — Telephone Encounter (Signed)
Did we receive the request for this patient's INR to be drawn at home?

## 2014-07-21 NOTE — Telephone Encounter (Signed)
Received form from [facility] INR Monitoring.Dr.Jordan signed form.Faxed back to fax # [phone].

## 2014-08-08 ENCOUNTER — Ambulatory Visit (INDEPENDENT_AMBULATORY_CARE_PROVIDER_SITE_OTHER): Payer: BLUE CROSS/BLUE SHIELD | Admitting: Pharmacist Clinician (PhC)/ Clinical Pharmacy Specialist

## 2014-08-08 DIAGNOSIS — G459 Transient cerebral ischemic attack, unspecified: Secondary | ICD-10-CM

## 2014-08-08 DIAGNOSIS — I359 Nonrheumatic aortic valve disorder, unspecified: Secondary | ICD-10-CM

## 2014-08-08 DIAGNOSIS — Z7901 Long term (current) use of anticoagulants: Secondary | ICD-10-CM

## 2014-08-08 LAB — POCT INR: INR: 2.3

## 2014-08-30 ENCOUNTER — Encounter: Payer: Self-pay | Admitting: Internal Medicine

## 2014-08-30 ENCOUNTER — Telehealth: Payer: Self-pay | Admitting: *Deleted

## 2014-08-30 ENCOUNTER — Ambulatory Visit (INDEPENDENT_AMBULATORY_CARE_PROVIDER_SITE_OTHER): Payer: BLUE CROSS/BLUE SHIELD | Admitting: Internal Medicine

## 2014-08-30 VITALS — BP 120/68 | HR 68 | Ht 72.64 in | Wt 226.1 lb

## 2014-08-30 DIAGNOSIS — I359 Nonrheumatic aortic valve disorder, unspecified: Secondary | ICD-10-CM | POA: Diagnosis not present

## 2014-08-30 DIAGNOSIS — Z1211 Encounter for screening for malignant neoplasm of colon: Secondary | ICD-10-CM

## 2014-08-30 DIAGNOSIS — Z8673 Personal history of transient ischemic attack (TIA), and cerebral infarction without residual deficits: Secondary | ICD-10-CM

## 2014-08-30 DIAGNOSIS — Z7901 Long term (current) use of anticoagulants: Secondary | ICD-10-CM

## 2014-08-30 NOTE — Progress Notes (Signed)
   Subjective:    Patient ID: Alexander Barton, male    DOB: [birth year], 64 y.o.   MRN: [id number] Cc: colon cancer screening HPI The patient is here to discuss having a screening colonoscopy. He had one a little over 10 years ago. It was negative for neoplasia. In the interim he has been anticoagulated, he has a  mechanical aortic valve replacement, and a thoracic aortic aneurysm repair. He has a history of strokes when his INR is low he says. He is not having any active GI problems. There is no recent history of stroke he is compliant with his warfarin and is monitored through the anticoagulation clinic affiliated with [facility].  Medications, allergies, past medical history, past surgical history, family history and social history are reviewed and updated in the EMR.  Review of Systems Positive for arthritis pain all other review of systems are negative.    Objective:   Physical Exam @BP  120/68 mmHg  Pulse 68  Ht 6' 0.64" (1.845 m)  Wt 226 lb 2 oz (102.57 kg)  BMI 30.13 kg/m2@  General:  NAD Eyes:   anicteric Lungs:  clear Heart:: S1 metallic S2 no rubs, murmurs or gallops Abdomen:  soft and nontender, BS+ Ext:   no edema, cyanosis or clubbing    Data Reviewed:  Audiology notes. Primary care notes from this year. Labs from this year with a normal CMET except glucose 120. Hemoglobin A1c 6%.      Assessment & Plan:  Colon cancer screening  Aortic valve disease  Long term current use of anticoagulant  History of stroke  We discussed the options of screening with a optical colonoscopy, CT colonoscopy and Cologuard DNA testing. We have decided to proceed with an optical colonoscopy.  Given his history of stroke when INR is low at the Lovenox window is appropriate for this man. We will coordinate that with anticoagulation clinic. We will confirm that is okay with his cardiologist Dr. [name]. I've explained this will minimize but not eliminate  the risk of stroke as he will need to be off his anticoagulation for a brief period of time.The risks and benefits as well as alternatives of endoscopic procedure(s) have been discussed and reviewed. All questions answered. The patient agrees to proceed.  I appreciate the opportunity to care for this patient. CC: MANNING, Drue Stager., MD

## 2014-08-30 NOTE — Telephone Encounter (Signed)
08/30/2014   RE: [name] DOB: 02/28/1950 MRN: [id number]   Dear Alexander Barton,    We have scheduled the above patient for an colonoscopy. Our records show that he is on coumadin.   Please advise as to how long the patient may come off his therapy of coumadin prior to the procedure, which is scheduled for 10-12-14. Please advise if patient needs to be bridge on lovenox.   Please fax back/ or route the completed form to [address] at [phone].   Sincerely,    Gerlean Ren

## 2014-08-30 NOTE — Patient Instructions (Signed)
You have been scheduled for a colonoscopy. Please follow written instructions given to you at your visit today.  Please pick up your prep supplies at the pharmacy within the next 1-3 days. If you use inhalers (even only as needed), please bring them with you on the day of your procedure.  I appreciate the opportunity to care for you. Gatha Mayer, MD, [facility]

## 2014-08-31 ENCOUNTER — Encounter: Payer: Self-pay | Admitting: Internal Medicine

## 2014-08-31 LAB — POCT INR: INR: 2.9

## 2014-09-01 ENCOUNTER — Ambulatory Visit (INDEPENDENT_AMBULATORY_CARE_PROVIDER_SITE_OTHER): Payer: BLUE CROSS/BLUE SHIELD | Admitting: Pharmacist Clinician (PhC)/ Clinical Pharmacy Specialist

## 2014-09-01 DIAGNOSIS — G459 Transient cerebral ischemic attack, unspecified: Secondary | ICD-10-CM

## 2014-09-01 DIAGNOSIS — I359 Nonrheumatic aortic valve disorder, unspecified: Secondary | ICD-10-CM

## 2014-09-01 DIAGNOSIS — Z7901 Long term (current) use of anticoagulants: Secondary | ICD-10-CM

## 2014-09-02 NOTE — Telephone Encounter (Signed)
Yes I think he is at higher risk and should be bridged for procedure. Will forward to pharmacy to assess.  Kesley Gaffey [name] MD, [facility]

## 2014-09-04 NOTE — Telephone Encounter (Signed)
Spoke with patient, colonoscopy is set for Spetember 8 (Thursday).  Made appt for patient to come to office on Aug 29 for teaching.

## 2014-09-12 NOTE — Telephone Encounter (Signed)
Contacted patient regarding information below. Pt made aware of appt with Cardio on 10-02-14 to discuss Lovenox bridge for colonoscopy. Pt verbalize understanding and to call back with any question or concerns.

## 2014-09-26 ENCOUNTER — Telehealth: Payer: Self-pay

## 2014-09-26 LAB — PROTIME-INR

## 2014-09-26 NOTE — Telephone Encounter (Signed)
Received clearance form from [facility].Dr.Jordan advised do not stop coumadin,if bleeding can be controlled.Patient has a mechanical aortic valve and prior embolic stroke.Form faxed to fax # [phone].

## 2014-09-28 ENCOUNTER — Encounter: Payer: Self-pay | Admitting: Internal Medicine

## 2014-09-28 ENCOUNTER — Encounter: Payer: Medicare Other | Admitting: Pharmacist Clinician (PhC)/ Clinical Pharmacy Specialist

## 2014-09-28 ENCOUNTER — Ambulatory Visit: Payer: Medicare Other | Admitting: Cardiology

## 2014-09-28 NOTE — Patient Instructions (Signed)
Enoxaprin Dosing Schedule  Enoxparin dose:  Date  Warfarin Dose (evenings) Enoxaparin dose  9-2 6 10  mg   9-3 5 0   9-4 4 0 8 am    8 pm  9-5 3 0 8 am    8 pm  9-6 2 0 8 am    8 pm  9-7 1 0 8 am      X  9-8 Procedure 5 mg   X          X  9-9 1 10  mg 8 am    8 pm  9-10 2 10  mg 8 am    8 pm  9-11 3 10  mg 8 am    8 pm  9-12 4 10  mg 8 am    8 pm  9-13 5 10  mg 8 am    8 pm  9-14 6 Repeat INR

## 2014-09-29 NOTE — Progress Notes (Signed)
This encounter was created in error - please disregard.

## 2014-10-02 ENCOUNTER — Ambulatory Visit: Payer: BLUE CROSS/BLUE SHIELD | Admitting: Pharmacist Clinician (PhC)/ Clinical Pharmacy Specialist

## 2014-10-04 ENCOUNTER — Ambulatory Visit (INDEPENDENT_AMBULATORY_CARE_PROVIDER_SITE_OTHER): Payer: Medicare Other | Admitting: Pharmacist Clinician (PhC)/ Clinical Pharmacy Specialist

## 2014-10-04 DIAGNOSIS — G459 Transient cerebral ischemic attack, unspecified: Secondary | ICD-10-CM

## 2014-10-04 DIAGNOSIS — I359 Nonrheumatic aortic valve disorder, unspecified: Secondary | ICD-10-CM

## 2014-10-04 DIAGNOSIS — Z7901 Long term (current) use of anticoagulants: Secondary | ICD-10-CM

## 2014-10-04 MED ORDER — ENOXAPARIN SODIUM 100 MG/ML ~~LOC~~ SOLN: 100.0000 mg | Freq: Two times a day (BID) | SUBCUTANEOUS | Status: DC

## 2014-10-04 NOTE — Patient Instructions (Addendum)
Reviewed lovenox bridge with patient - gave instructions on injection technique and patient demonstrated in office.  Will have him return for INR post procedure.  Also will file application after Sept 1 for him to replace InRatio monitor with Coagucheck from [employer].    Enoxaprin Dosing Schedule  Enoxparin dose:  Date  Warfarin Dose (evenings) Enoxaprin Dose  9-2 6 10  mg   9-3 5 0   9-4 4 0 8 am     8 pm  9-5 3 0 8 am     8 pm  9-6 2 0 8 am     8 pm  9-7 1 0 8 am        X  9-8 Procedure 5 mg    X           X    9-9 1 10  mg 8 am     8 pm  9-10 2 10  mg 8 am     8 pm  9-11 3 10  mg 8 am     8 pm  9-12 4 10  mg 8 am     8 pm  9-13 5 10  mg 8 am     8 pm  9-14 6 Repeat INR

## 2014-10-12 ENCOUNTER — Encounter: Payer: Self-pay | Admitting: Internal Medicine

## 2014-10-12 ENCOUNTER — Ambulatory Visit (AMBULATORY_SURGERY_CENTER): Payer: Medicare Other | Admitting: Internal Medicine

## 2014-10-12 VITALS — BP 98/57 | HR 65 | Temp 98.7°F | Resp 14 | Ht 72.0 in | Wt 226.0 lb

## 2014-10-12 DIAGNOSIS — Z1211 Encounter for screening for malignant neoplasm of colon: Secondary | ICD-10-CM | POA: Diagnosis not present

## 2014-10-12 MED ORDER — SODIUM CHLORIDE 0.9 % IV SOLN: 500.0000 mL | INTRAVENOUS | Status: DC

## 2014-10-12 NOTE — Progress Notes (Signed)
Pt with large swollen area involving knuckles of 2nd, 3rd and 4th fingers, area is hot and very painful. Patient states that he bumped his hand gently on Sunday and swelling has become worse as time has progressed. Ice given by front staff person but patient states heat makes it feel better. Hot pack applied. Dr. Carlean Purl notified.

## 2014-10-12 NOTE — Progress Notes (Signed)
  [facility] Post-op Note  Patient: Alexander Barton  Procedure(s) Performed: colonoscopy  Patient Location: [facility] - Recovery Area  Anesthesia Type: Deep Sedation/Propofol  Level of Consciousness: awake, oriented and patient cooperative  Airway and Oxygen Therapy: Patient Spontanous Breathing  Post-op Pain: none  Post-op Assessment:  Post-op Vital signs reviewed, Patient's Cardiovascular Status Stable, Respiratory Function Stable, Patent Airway, No signs of Nausea or vomiting and Pain level controlled  Post-op Vital Signs: Reviewed and stable  Complications: No apparent anesthesia complications  Moriya Mitchell E 1:57 PM

## 2014-10-12 NOTE — Op Note (Signed)
[place]  [address] [place], [address]   COLONOSCOPY PROCEDURE REPORT  PATIENT: Khristian, Phillippi  MR#: [id number] BIRTHDATE: [birth year] , 64  yrs. old GENDER: male ENDOSCOPIST: Gatha Mayer, MD, [facility] REFERRED BY: PROCEDURE DATE:  10/12/2014 PROCEDURE:   Colonoscopy, screening First Screening Colonoscopy - Avg.  risk and is 50 yrs.  old or older Yes.  Prior Negative Screening - Now for repeat screening. N/A  History of Adenoma - Now for follow-up colonoscopy & has been > or = to 3 yrs.  N/A  Polyps removed today? No Recommend repeat exam, <10 yrs? No ASA CLASS:   Class III INDICATIONS:Screening for colonic neoplasia and Colorectal Neoplasm Risk Assessment for this procedure is average risk. MEDICATIONS: Propofol 200 mg IV and Monitored anesthesia care  DESCRIPTION OF PROCEDURE:   After the risks benefits and alternatives of the procedure were thoroughly explained, informed consent was obtained.  The digital rectal exam revealed no abnormalities of the rectum, revealed no prostatic nodules, and revealed the prostate was not enlarged.   The LB [id number] endoscope was introduced through the anus and advanced to the cecum, which was identified by both the appendix and ileocecal valve. No adverse events experienced.   The quality of the prep was excellent.  (MiraLax was used)  The instrument was then slowly withdrawn as the colon was fully examined. Estimated blood loss is zero unless otherwise noted in this procedure report.      COLON FINDINGS: A normal appearing cecum, ileocecal valve, and appendiceal orifice were identified.  The ascending, transverse, descending, sigmoid colon, and rectum appeared unremarkable. Retroflexed views revealed no abnormalities. The time to cecum = 1.6 Withdrawal time = 6.8   The scope was withdrawn and the procedure completed. COMPLICATIONS: There were no immediate complications.  ENDOSCOPIC  IMPRESSION: Normal colonoscopy - excellent prep  RECOMMENDATIONS: Consider repeat colon screen 10 years.  2026  eSigned:  Gatha Mayer, MD, [facility] 10/12/2014 2:01 PM   cc: Molli Barrows, MD and The Patient

## 2014-10-12 NOTE — Progress Notes (Signed)
Pt received- Hypotensive 87/46, c/o slight dizziness, trendelenburg position. Will monitor

## 2014-10-12 NOTE — Patient Instructions (Addendum)
Colonoscopy was normal!  Consider repeating colon cancer screening in 2026 at age 64.  Anti-coagulation dosing as instructed.  Enoxparin dose:  Date  Warfarin Dose (evenings) Enoxaprin Dose                                9-8 Procedure 5 mg  X X   9-9 1 10  mg 8 am 8 pm  9-10 2 10  mg 8 am 8 pm  9-11 3 10  mg 8 am 8 pm  9-12 4 10  mg 8 am 8 pm  9-13 5 10  mg 8 am 8 pm  9-14 6 Repeat INR     I appreciate the opportunity to care for you Gatha Mayer, MD, FACG    YOU HAD AN ENDOSCOPIC PROCEDURE TODAY AT [place]:   Refer to the procedure report that was given to you for any specific questions about what was found during the examination.  If the procedure report does not answer your questions, please call your gastroenterologist to clarify.  If you requested that your care partner not be given the details of your procedure findings, then the procedure report has been included in a sealed envelope for you to review at your convenience later.  YOU SHOULD EXPECT: Some feelings of bloating in the abdomen. Passage of more gas than usual.  Walking can help get rid of the air that was put into your GI tract during the procedure and reduce the bloating. If you had a lower endoscopy (such as a colonoscopy or flexible sigmoidoscopy) you may notice spotting of blood in your stool or on the toilet paper. If you underwent a bowel prep for your procedure, you may not have a normal bowel movement for a few days.  Please Note:  You might notice some irritation and congestion in your nose or some drainage.  This is from the oxygen used during your procedure.  There is no need for concern and it should clear up in a day or so.  SYMPTOMS TO REPORT IMMEDIATELY:   Following lower endoscopy (colonoscopy or flexible sigmoidoscopy):  Excessive amounts of blood in the stool  Significant tenderness or worsening of  abdominal pains  Swelling of the abdomen that is new, acute  Fever of 100F or higher   For urgent or emergent issues, a gastroenterologist can be reached at any hour by calling [phone].   DIET: Your first meal following the procedure should be a small meal and then it is ok to progress to your normal diet. Heavy or fried foods are harder to digest and may make you feel nauseous or bloated.  Likewise, meals heavy in dairy and vegetables can increase bloating.  Drink plenty of fluids but you should avoid alcoholic beverages for 24 hours.  ACTIVITY:  You should plan to take it easy for the rest of today and you should NOT DRIVE or use heavy machinery until tomorrow (because of the sedation medicines used during the test).    FOLLOW UP: Our staff will call the number listed on your records the next business day following your procedure to check on you and address any questions or concerns that you may have regarding the information given to you following your procedure. If we do not reach you, we will leave a message.  However, if you are feeling well and you are not experiencing any problems, there is no need to return  our call.  We will assume that you have returned to your regular daily activities without incident.  If any biopsies were taken you will be contacted by phone or by letter within the next 1-3 weeks.  Please call us at [phone] if you have not heard about the biopsies in 3 weeks.    SIGNATURES/CONFIDENTIALITY: You and/or your care partner have signed paperwork which will be entered into your electronic medical record.  These signatures attest to the fact that that the information above on your After Visit Summary has been reviewed and is understood.  Full responsibility of the confidentiality of this discharge information lies with you and/or your care-partner.

## 2014-10-13 ENCOUNTER — Telehealth: Payer: Self-pay | Admitting: *Deleted

## 2014-10-13 NOTE — Telephone Encounter (Signed)
  Follow up Call-  Call back number 10/12/2014  Post procedure Call Back phone  # [phone]  Permission to leave phone message Yes     Patient questions:  Do you have a fever, pain , or abdominal swelling? No. Pain Score  0 *  Have you tolerated food without any problems? Yes.    Have you been able to return to your normal activities? Yes.    Do you have any questions about your discharge instructions: Diet   No. Medications  No. Follow up visit  No.  Do you have questions or concerns about your Care? No.  Actions: * If pain score is 4 or above: No action needed, pain <4.

## 2014-10-18 ENCOUNTER — Ambulatory Visit (INDEPENDENT_AMBULATORY_CARE_PROVIDER_SITE_OTHER): Payer: Medicare Other | Admitting: Pharmacist Clinician (PhC)/ Clinical Pharmacy Specialist

## 2014-10-18 ENCOUNTER — Telehealth: Payer: Self-pay | Admitting: Pharmacist Clinician (PhC)/ Clinical Pharmacy Specialist

## 2014-10-18 DIAGNOSIS — I359 Nonrheumatic aortic valve disorder, unspecified: Secondary | ICD-10-CM

## 2014-10-18 DIAGNOSIS — G459 Transient cerebral ischemic attack, unspecified: Secondary | ICD-10-CM

## 2014-10-18 DIAGNOSIS — Z7901 Long term (current) use of anticoagulants: Secondary | ICD-10-CM

## 2014-10-18 LAB — POCT INR: INR: 1.8

## 2014-10-18 NOTE — Telephone Encounter (Signed)
Faxed last office visit, anticoagulation spreadsheet and original anticoagulation note to [facility] to determine coverage of home meter for patient

## 2014-10-19 ENCOUNTER — Encounter: Payer: Self-pay | Admitting: Cardiology

## 2014-10-27 ENCOUNTER — Ambulatory Visit (INDEPENDENT_AMBULATORY_CARE_PROVIDER_SITE_OTHER): Payer: Medicare Other | Admitting: Pharmacist Clinician (PhC)/ Clinical Pharmacy Specialist

## 2014-10-27 DIAGNOSIS — I359 Nonrheumatic aortic valve disorder, unspecified: Secondary | ICD-10-CM

## 2014-10-27 DIAGNOSIS — G459 Transient cerebral ischemic attack, unspecified: Secondary | ICD-10-CM

## 2014-10-27 DIAGNOSIS — Z7901 Long term (current) use of anticoagulants: Secondary | ICD-10-CM

## 2014-10-27 LAB — POCT INR: INR: 2

## 2014-11-03 ENCOUNTER — Ambulatory Visit (INDEPENDENT_AMBULATORY_CARE_PROVIDER_SITE_OTHER): Payer: Medicare Other | Admitting: Pharmacist Clinician (PhC)/ Clinical Pharmacy Specialist

## 2014-11-03 DIAGNOSIS — G459 Transient cerebral ischemic attack, unspecified: Secondary | ICD-10-CM

## 2014-11-03 DIAGNOSIS — I359 Nonrheumatic aortic valve disorder, unspecified: Secondary | ICD-10-CM

## 2014-11-03 DIAGNOSIS — Z7901 Long term (current) use of anticoagulants: Secondary | ICD-10-CM

## 2014-11-03 LAB — POCT INR: INR: 2.6

## 2014-11-14 ENCOUNTER — Ambulatory Visit (INDEPENDENT_AMBULATORY_CARE_PROVIDER_SITE_OTHER): Payer: Medicare Other | Admitting: Family Medicine

## 2014-11-14 ENCOUNTER — Encounter: Payer: Self-pay | Admitting: Family Medicine

## 2014-11-14 VITALS — BP 124/76 | HR 80 | Temp 98.4°F | Ht 73.5 in | Wt 225.1 lb

## 2014-11-14 DIAGNOSIS — E785 Hyperlipidemia, unspecified: Secondary | ICD-10-CM

## 2014-11-14 DIAGNOSIS — E7800 Pure hypercholesterolemia, unspecified: Secondary | ICD-10-CM

## 2014-11-14 DIAGNOSIS — I359 Nonrheumatic aortic valve disorder, unspecified: Secondary | ICD-10-CM | POA: Diagnosis not present

## 2014-11-14 DIAGNOSIS — Z7901 Long term (current) use of anticoagulants: Secondary | ICD-10-CM

## 2014-11-14 DIAGNOSIS — Z794 Long term (current) use of insulin: Secondary | ICD-10-CM

## 2014-11-14 DIAGNOSIS — Z23 Encounter for immunization: Secondary | ICD-10-CM

## 2014-11-14 DIAGNOSIS — Z125 Encounter for screening for malignant neoplasm of prostate: Secondary | ICD-10-CM

## 2014-11-14 DIAGNOSIS — E119 Type 2 diabetes mellitus without complications: Secondary | ICD-10-CM | POA: Diagnosis not present

## 2014-11-14 DIAGNOSIS — I10 Essential (primary) hypertension: Secondary | ICD-10-CM

## 2014-11-14 DIAGNOSIS — K219 Gastro-esophageal reflux disease without esophagitis: Secondary | ICD-10-CM | POA: Insufficient documentation

## 2014-11-14 DIAGNOSIS — J449 Chronic obstructive pulmonary disease, unspecified: Secondary | ICD-10-CM

## 2014-11-14 DIAGNOSIS — Z0000 Encounter for general adult medical examination without abnormal findings: Secondary | ICD-10-CM

## 2014-11-14 DIAGNOSIS — N400 Enlarged prostate without lower urinary tract symptoms: Secondary | ICD-10-CM

## 2014-11-14 LAB — LIPID PANEL
Cholesterol: 254 mg/dL — ABNORMAL HIGH (ref 0–200)
HDL: 48.5 mg/dL (ref >39.00)
NonHDL: 205.83
Total CHOL/HDL Ratio: 5
Triglycerides: 202 mg/dL — ABNORMAL HIGH (ref 0.0–149.0)
VLDL: 40.4 mg/dL — ABNORMAL HIGH (ref 0.0–40.0)

## 2014-11-14 LAB — LDL CHOLESTEROL, DIRECT: Direct LDL: 172 mg/dL

## 2014-11-14 MED ORDER — ROSUVASTATIN CALCIUM 10 MG PO TABS: 10.0000 mg | ORAL_TABLET | Freq: Every day | ORAL | Status: DC

## 2014-11-14 MED ORDER — METOPROLOL SUCCINATE ER 50 MG PO TB24: 50.0000 mg | ORAL_TABLET | Freq: Every day | ORAL | Status: DC

## 2014-11-14 MED ORDER — TIOTROPIUM BROMIDE MONOHYDRATE 18 MCG IN CAPS: 18.0000 ug | ORAL_CAPSULE | Freq: Every day | RESPIRATORY_TRACT | Status: DC

## 2014-11-14 MED ORDER — TAMSULOSIN HCL 0.4 MG PO CAPS: 0.8000 mg | ORAL_CAPSULE | Freq: Every day | ORAL | Status: DC

## 2014-11-14 NOTE — Assessment & Plan Note (Signed)
Given comorbidities and poor control of hyperlipidemia I recommended increasing to Crestor 10 mg daily. Will obtain lipid panel today. Will consider further titration pending lipid panel results and patient tolerance of statin.

## 2014-11-14 NOTE — Assessment & Plan Note (Addendum)
Well controlled on metoprolol. Will continue. Metoprolol refilled today.

## 2014-11-14 NOTE — Assessment & Plan Note (Addendum)
Stable Spiriva. Refilled today.

## 2014-11-14 NOTE — Assessment & Plan Note (Addendum)
Doing well on ASA and coumadin.  Discussed more aggressive treatment of HLD (see separate problem).

## 2014-11-14 NOTE — Patient Instructions (Addendum)
It was nice to see you today.  Increase the Crestor to 10 mg daily.  We will call with your lab results.  Continue your other medications. Refills were sent.   Follow up in ~ 6 months or sooner if needed.  Take care  Dr. Lacinda Axon

## 2014-11-14 NOTE — Assessment & Plan Note (Addendum)
Well controlled with diet. 

## 2014-11-14 NOTE — Progress Notes (Signed)
Subjective:  Patient ID: Alexander Barton, male    DOB: 12-Dec-1950  Age: 64 y.o. MRN: [id number]  CC: Establish care  HPI Alexander Barton is a 64 y.o. male with a complicated PMH presents to the clinic to establish care. Current issues are below.  1) CVA/TIA  Doing well on ASA and Coumadin.  2) HTN  Well controlled Metoprolol.   3) HLD  Last lipid panel was in 06/2014. LDL was 171.   He was placed on Crestor around that time.  He is currently taking 5 mg daily.  No reports of myalgias/intolerance.  4) COPD  Doing well on Spiriva.   5) DM-2  Well controlled. Diet controlled.   PMH, Surgical Hx, Family Hx, Social History reviewed and updated as below. Past Medical History  Diagnosis Date  . HTN (hypertension)   . Hypercholesterolemia   . COPD (chronic obstructive pulmonary disease) ([place])   . GERD (gastroesophageal reflux disease)   . Diabetes mellitus   . TIA (transient ischemic attack)   . Aortic aneurysm, thoracic ([place])   . Aortic valve disease   . BPH (benign prostatic hyperplasia)   . Arthritis    Past Surgical History  Procedure Laterality Date  . Aortic valve replacement      #23 On-X valve conduit  . Back surgery    . Foot surgery    . Achilles tendon repair    . Ascending aortic aneurysm repair     Family History  Problem Relation Age of Onset  . Alzheimer's disease Father   . Diabetes Father     age onset DM  . Hypertension Sister   . Hypertension Brother   . Diabetes Sister   . Diabetes Mother   . Colon cancer Neg Hx    Social History  Substance Use Topics  . Smoking status: Never Smoker   . Smokeless tobacco: Never Used  . Alcohol Use: No   Review of Systems  HENT: Positive for voice change.        Recent mouth sores following dental procedure.   Respiratory: Positive for shortness of breath.   Genitourinary: Positive for frequency.       Decreased stream.  Psychiatric/Behavioral:       Memory problems.   All other  systems negative.  Objective:   Today's Vitals: BP 124/76 mmHg  Pulse 80  Temp(Src) 98.4 F (36.9 C) (Oral)  Ht 6' 1.5" (1.867 m)  Wt 225 lb 2 oz (102.116 kg)  BMI 29.30 kg/m2  SpO2 94%  Physical Exam  Constitutional: He appears well-developed and well-nourished. No distress.  HENT:  Head: Normocephalic and atraumatic.  Nose: Nose normal.  Mouth/Throat: Oropharynx is clear and moist. No oropharyngeal exudate.  Normal TM's bilaterally.   Eyes: Conjunctivae are normal. No scleral icterus.  Neck: Neck supple. No thyromegaly present.  Cardiovascular: Normal rate and regular rhythm.   Aortic valve click.  Pulmonary/Chest: Effort normal and breath sounds normal. He has no wheezes. He has no rales.  Abdominal: Soft. He exhibits no distension. There is no tenderness. There is no rebound and no guarding.  Musculoskeletal: Normal range of motion. He exhibits no edema.  Lymphadenopathy:    He has no cervical adenopathy.  Neurological: He is alert.  Skin: Skin is warm and dry. No rash noted.  Psychiatric: He has a normal mood and affect.  Vitals reviewed.  Assessment & Plan:   Problem List Items Addressed This Visit    Type 2 diabetes mellitus ([place])  Well controlled with diet.      Relevant Medications   rosuvastatin (CRESTOR) 10 MG tablet   Preventative health care    PCV 13 and Flu given today.      Long term current use of anticoagulant therapy   Relevant Medications   rosuvastatin (CRESTOR) 10 MG tablet   Hyperlipidemia    Given comorbidities and poor control of hyperlipidemia I recommended increasing to Crestor 10 mg daily. Will obtain lipid panel today. Will consider further titration pending lipid panel results and patient tolerance of statin.       Relevant Medications   rosuvastatin (CRESTOR) 10 MG tablet   metoprolol succinate (TOPROL-XL) 50 MG 24 hr tablet   HTN (hypertension) (Chronic)    Well controlled on metoprolol. Will continue. Metoprolol refilled  today.      Relevant Medications   rosuvastatin (CRESTOR) 10 MG tablet   metoprolol succinate (TOPROL-XL) 50 MG 24 hr tablet   Chronic obstructive pulmonary disease (H[facility])    Stable Spiriva. Refilled today.      Relevant Medications   tiotropium (SPIRIVA) 18 MCG inhalation capsule   BPH (benign prostatic hyperplasia)    Increase Flomax to 0.8 mg daily.      Relevant Medications   tamsulosin (FLOMAX) 0.4 MG CAPS capsule   Aortic valve disease (Chronic)   Relevant Medications   rosuvastatin (CRESTOR) 10 MG tablet   metoprolol succinate (TOPROL-XL) 50 MG 24 hr tablet    Other Visit Diagnoses    HLD (hyperlipidemia)    -  Primary    Relevant Medications    rosuvastatin (CRESTOR) 10 MG tablet    metoprolol succinate (TOPROL-XL) 50 MG 24 hr tablet    Other Relevant Orders    Lipid panel    Screening for prostate cancer        Relevant Orders    PSA, total and free    Need for prophylactic vaccination against Streptococcus pneumoniae (pneumococcus)        Relevant Orders    Pneumococcal conjugate vaccine 13-valent IM (Completed)    Encounter for immunization           Outpatient Encounter Prescriptions as of 11/14/2014  Medication Sig  . Apoaequorin (PREVAGEN) 10 MG CAPS Take 10 mg by mouth.  Marland Kitchen aspirin 81 MG tablet Take 81 mg by mouth every evening.   . Calcium Carbonate-Vitamin D (TGT CALCIUM DIETARY SUPPLEMENT PO) Take 12.9 g by mouth.  . Cinnamon 500 MG TABS Take 2,000 mg by mouth.   . metoprolol succinate (TOPROL-XL) 50 MG 24 hr tablet Take 1 tablet (50 mg total) by mouth daily. Take with or immediately following a meal.  . Misc Natural Products (COLON CLEANSE PO) Take 550 mg by mouth.  . Red Yeast Rice 600 MG TABS Take 600 mg by mouth.  . rosuvastatin (CRESTOR) 10 MG tablet Take 1 tablet (10 mg total) by mouth at bedtime.  . tamsulosin (FLOMAX) 0.4 MG CAPS capsule Take 2 capsules (0.8 mg total) by mouth daily.  Marland Kitchen tiotropium (SPIRIVA) 18 MCG inhalation capsule Place 1  capsule (18 mcg total) into inhaler and inhale daily.  Marland Kitchen warfarin (COUMADIN) 2 MG tablet TAKE 1 TABLET WITH 5 MG TABLET DAILY AS DIRECTED BY COUMADIN CLINIC  . warfarin (COUMADIN) 5 MG tablet TAKE 1 TABLET DAILY WITH 2 MG TABLET AS DIRECTED BY COUMADIN CLINIC  . [DISCONTINUED] metoprolol succinate (TOPROL-XL) 50 MG 24 hr tablet Take 1 tablet (50 mg total) by mouth daily. Take with or immediately following  a meal.  . [DISCONTINUED] rosuvastatin (CRESTOR) 10 MG tablet Take 1 tablet (10 mg total) by mouth at bedtime.  . [DISCONTINUED] tamsulosin (FLOMAX) 0.4 MG CAPS Take 1 capsule (0.4 mg total) by mouth every evening.  . [DISCONTINUED] tiotropium (SPIRIVA) 18 MCG inhalation capsule Place 18 mcg into inhaler and inhale as needed.   . [DISCONTINUED] enoxaparin (LOVENOX) 100 MG/ML injection Inject 1 mL (100 mg total) into the skin every 12 (twelve) hours.  . [DISCONTINUED] L-Methylfolate-B12-B6-B2 (CEREFOLIN) 07-04-48-5 MG TABS 2 (two) times daily after a meal.    No facility-administered encounter medications on file as of 11/14/2014.    Follow-up: 6 months.  Coral Spikes DO

## 2014-11-14 NOTE — Assessment & Plan Note (Signed)
Increase Flomax to 0.8mg daily

## 2014-11-14 NOTE — Assessment & Plan Note (Signed)
PCV 13 and Flu given today.

## 2014-11-14 NOTE — Progress Notes (Signed)
Pre visit review using our clinic review tool, if applicable. No additional management support is needed unless otherwise documented below in the visit note. 

## 2014-11-15 LAB — PSA, TOTAL AND FREE
PSA, Free Pct: 56 % (ref >25)
PSA, Free: 0.19 ng/mL
PSA: 0.34 ng/mL (ref <=4.00)

## 2014-11-23 ENCOUNTER — Ambulatory Visit (INDEPENDENT_AMBULATORY_CARE_PROVIDER_SITE_OTHER): Payer: Medicare Other | Admitting: Pharmacist Clinician (PhC)/ Clinical Pharmacy Specialist

## 2014-11-23 DIAGNOSIS — I359 Nonrheumatic aortic valve disorder, unspecified: Secondary | ICD-10-CM

## 2014-11-23 DIAGNOSIS — G459 Transient cerebral ischemic attack, unspecified: Secondary | ICD-10-CM

## 2014-11-23 DIAGNOSIS — Z7901 Long term (current) use of anticoagulants: Secondary | ICD-10-CM

## 2014-11-23 LAB — POCT INR: INR: 3.1

## 2014-11-24 ENCOUNTER — Other Ambulatory Visit: Payer: Self-pay | Admitting: Cardiology

## 2014-12-14 LAB — PROTIME-INR: INR: 3.3 — AB (ref 0.9–1.1)

## 2014-12-15 LAB — POCT INR: INR: 3.3

## 2014-12-18 ENCOUNTER — Ambulatory Visit (INDEPENDENT_AMBULATORY_CARE_PROVIDER_SITE_OTHER): Payer: Medicare Other | Admitting: Pharmacist Clinician (PhC)/ Clinical Pharmacy Specialist

## 2014-12-18 DIAGNOSIS — G459 Transient cerebral ischemic attack, unspecified: Secondary | ICD-10-CM

## 2014-12-18 DIAGNOSIS — Z7901 Long term (current) use of anticoagulants: Secondary | ICD-10-CM

## 2014-12-18 DIAGNOSIS — I359 Nonrheumatic aortic valve disorder, unspecified: Secondary | ICD-10-CM

## 2014-12-26 LAB — POCT INR: INR: 3

## 2014-12-27 ENCOUNTER — Telehealth: Payer: Self-pay | Admitting: Family Medicine

## 2014-12-27 ENCOUNTER — Other Ambulatory Visit: Payer: Self-pay | Admitting: Family Medicine

## 2014-12-27 ENCOUNTER — Ambulatory Visit (INDEPENDENT_AMBULATORY_CARE_PROVIDER_SITE_OTHER): Payer: Medicare Other | Admitting: Pharmacist Clinician (PhC)/ Clinical Pharmacy Specialist

## 2014-12-27 ENCOUNTER — Encounter: Payer: Self-pay | Admitting: Cardiology

## 2014-12-27 DIAGNOSIS — I359 Nonrheumatic aortic valve disorder, unspecified: Secondary | ICD-10-CM

## 2014-12-27 DIAGNOSIS — Z7901 Long term (current) use of anticoagulants: Secondary | ICD-10-CM

## 2014-12-27 DIAGNOSIS — G459 Transient cerebral ischemic attack, unspecified: Secondary | ICD-10-CM

## 2014-12-27 MED ORDER — ROSUVASTATIN CALCIUM 20 MG PO TABS: 20.0000 mg | ORAL_TABLET | Freq: Every day | ORAL | Status: DC

## 2014-12-27 NOTE — Telephone Encounter (Signed)
Rx sent for Crestor 20 mg daily.

## 2014-12-27 NOTE — Telephone Encounter (Signed)
Please advise if i can change and send in

## 2014-12-27 NOTE — Telephone Encounter (Signed)
Pt called stating that Dr. Lacinda Axon changed his prescription of crestor to 2 tablets a day. He is about to run out in the next couple days and tried to call his pharmacy to get a refill and they declined it. They stated that it was to early to get this filled. He needs this fixed so he can continue his meds. Please advise. Thanks

## 2015-01-04 ENCOUNTER — Telehealth: Payer: Self-pay | Admitting: Family Medicine

## 2015-01-04 MED ORDER — ROSUVASTATIN CALCIUM 20 MG PO TABS: 20.0000 mg | ORAL_TABLET | Freq: Every day | ORAL | Status: DC

## 2015-01-04 NOTE — Telephone Encounter (Signed)
[facility] called about pt medication rosuvastatin (CRESTOR) 20 MG tablet. Pt states that he takes 2 tabs a day. Pharmacist states another Rx is needed. [facility]acy is [id number] - [place], [address] Thank You!  FYI message was on Triage voicemail.

## 2015-01-04 NOTE — Telephone Encounter (Signed)
Patient does not use [facility] any longer so I sent the RX for [facility]restor [facility] MG to [facility] [place].

## 2015-01-14 LAB — POCT INR: INR: 3.7

## 2015-01-15 ENCOUNTER — Ambulatory Visit (INDEPENDENT_AMBULATORY_CARE_PROVIDER_SITE_OTHER): Payer: Medicare Other | Admitting: Pharmacist Clinician (PhC)/ Clinical Pharmacy Specialist

## 2015-01-15 DIAGNOSIS — Z7901 Long term (current) use of anticoagulants: Secondary | ICD-10-CM

## 2015-01-15 DIAGNOSIS — G459 Transient cerebral ischemic attack, unspecified: Secondary | ICD-10-CM

## 2015-01-15 DIAGNOSIS — I359 Nonrheumatic aortic valve disorder, unspecified: Secondary | ICD-10-CM

## 2015-01-26 ENCOUNTER — Ambulatory Visit (INDEPENDENT_AMBULATORY_CARE_PROVIDER_SITE_OTHER): Payer: Medicare Other | Admitting: Pharmacist Clinician (PhC)/ Clinical Pharmacy Specialist

## 2015-01-26 DIAGNOSIS — G459 Transient cerebral ischemic attack, unspecified: Secondary | ICD-10-CM

## 2015-01-26 DIAGNOSIS — I359 Nonrheumatic aortic valve disorder, unspecified: Secondary | ICD-10-CM

## 2015-01-26 DIAGNOSIS — Z7901 Long term (current) use of anticoagulants: Secondary | ICD-10-CM

## 2015-01-26 LAB — POCT INR: INR: 2.6

## 2015-01-27 LAB — PROTIME-INR

## 2015-02-01 ENCOUNTER — Encounter: Payer: Self-pay | Admitting: Cardiology

## 2015-02-11 LAB — POCT INR: INR: 2.6

## 2015-02-13 ENCOUNTER — Ambulatory Visit (INDEPENDENT_AMBULATORY_CARE_PROVIDER_SITE_OTHER): Payer: Medicare Other | Admitting: Pharmacist Clinician (PhC)/ Clinical Pharmacy Specialist

## 2015-02-13 DIAGNOSIS — G459 Transient cerebral ischemic attack, unspecified: Secondary | ICD-10-CM

## 2015-02-13 DIAGNOSIS — I359 Nonrheumatic aortic valve disorder, unspecified: Secondary | ICD-10-CM

## 2015-02-13 DIAGNOSIS — Z7901 Long term (current) use of anticoagulants: Secondary | ICD-10-CM

## 2015-03-06 LAB — POCT INR: INR: 2.9

## 2015-03-07 ENCOUNTER — Ambulatory Visit (INDEPENDENT_AMBULATORY_CARE_PROVIDER_SITE_OTHER): Payer: Medicare Other | Admitting: Pharmacist Clinician (PhC)/ Clinical Pharmacy Specialist

## 2015-03-07 DIAGNOSIS — I359 Nonrheumatic aortic valve disorder, unspecified: Secondary | ICD-10-CM

## 2015-03-07 DIAGNOSIS — G459 Transient cerebral ischemic attack, unspecified: Secondary | ICD-10-CM

## 2015-03-07 DIAGNOSIS — Z7901 Long term (current) use of anticoagulants: Secondary | ICD-10-CM

## 2015-03-28 ENCOUNTER — Ambulatory Visit (INDEPENDENT_AMBULATORY_CARE_PROVIDER_SITE_OTHER): Payer: Medicare Other | Admitting: Pharmacist Clinician (PhC)/ Clinical Pharmacy Specialist

## 2015-03-28 DIAGNOSIS — I359 Nonrheumatic aortic valve disorder, unspecified: Secondary | ICD-10-CM

## 2015-03-28 DIAGNOSIS — Z7901 Long term (current) use of anticoagulants: Secondary | ICD-10-CM

## 2015-03-28 DIAGNOSIS — G459 Transient cerebral ischemic attack, unspecified: Secondary | ICD-10-CM

## 2015-03-28 LAB — POCT INR: INR: 3.1

## 2015-04-06 ENCOUNTER — Encounter: Payer: Self-pay | Admitting: Internal Medicine

## 2015-04-06 ENCOUNTER — Ambulatory Visit (INDEPENDENT_AMBULATORY_CARE_PROVIDER_SITE_OTHER): Payer: Medicare Other | Admitting: Internal Medicine

## 2015-04-06 VITALS — BP 108/68 | HR 67 | Temp 98.1°F | Ht 73.5 in | Wt 227.0 lb

## 2015-04-06 DIAGNOSIS — J209 Acute bronchitis, unspecified: Secondary | ICD-10-CM

## 2015-04-06 MED ORDER — AMOXICILLIN-POT CLAVULANATE 875-125 MG PO TABS: 1.0000 | ORAL_TABLET | Freq: Two times a day (BID) | ORAL | Status: DC

## 2015-04-06 MED ORDER — BENZONATATE 200 MG PO CAPS: 200.0000 mg | ORAL_CAPSULE | Freq: Two times a day (BID) | ORAL | Status: DC | PRN

## 2015-04-06 NOTE — Patient Instructions (Addendum)
Start Augmentin twice daily.  Use Tessalon as needed for cough.  Check INR on Monday as scheduled.   Follow up in 2 weeks or sooner if symptoms are not improving.

## 2015-04-06 NOTE — Progress Notes (Signed)
Subjective:    Patient ID: Alexander Barton, male    DOB: 1950-04-09, 65 y.o.   MRN: [id number]  HPI  65YO male presents for acute visit.  Congestion and cough - Started about 2-3 weeks ago. Wife sick with similar symptoms. No fever.  Having trouble expectorating mucous. Mucous is thick and clumpy. Feels short of breath. Took Mucinex with no improvement. Runny nose, sore throat early in course.    Wt Readings from Last 3 Encounters:  04/06/15 227 lb (102.967 kg)  11/14/14 225 lb 2 oz (102.116 kg)  10/12/14 226 lb (102.513 kg)   BP Readings from Last 3 Encounters:  04/06/15 108/68  11/14/14 124/76  10/12/14 98/57    Past Medical History  Diagnosis Date  . HTN (hypertension)   . Hypercholesterolemia   . COPD (chronic obstructive pulmonary disease) ([place])   . GERD (gastroesophageal reflux disease)   . Diabetes mellitus   . TIA (transient ischemic attack)   . Aortic aneurysm, thoracic ([place])   . Aortic valve disease   . BPH (benign prostatic hyperplasia)   . Arthritis    Family History  Problem Relation Age of Onset  . Alzheimer's disease Father   . Diabetes Father     age onset DM  . Hypertension Sister   . Hypertension Brother   . Diabetes Sister   . Diabetes Mother   . Colon cancer Neg Hx    Past Surgical History  Procedure Laterality Date  . Aortic valve replacement      #23 On-X valve conduit  . Back surgery    . Foot surgery    . Achilles tendon repair    . Ascending aortic aneurysm repair     Social History   Social History  . Marital Status: Married    Spouse Name: N/A  . Number of Children: 2  . Years of Education: N/A   Occupational History  . SALES    Social History Main Topics  . Smoking status: Never Smoker   . Smokeless tobacco: Never Used  . Alcohol Use: No  . Drug Use: No  . Sexual Activity: Yes   Other Topics Concern  . 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   One son and one daughter   He lives in [place]   One caffeinated beverage daily   08/30/2014       Review of Systems  Constitutional: Positive for fatigue. Negative for fever, chills and activity change.  HENT: Positive for congestion and sore throat. Negative for ear discharge, ear pain, hearing loss, nosebleeds, postnasal drip, rhinorrhea, sinus pressure, sneezing, tinnitus, trouble swallowing and voice change.   Eyes: Negative for discharge, redness, itching and visual disturbance.  Respiratory: Positive for cough and shortness of breath. Negative for chest tightness, wheezing and stridor.   Cardiovascular: Negative for chest pain and leg swelling.  Musculoskeletal: Negative for myalgias, arthralgias, neck pain and neck stiffness.  Skin: Negative for color change and rash.  Neurological: Negative for dizziness, facial asymmetry and headaches.  Psychiatric/Behavioral: Negative for sleep disturbance.       Objective:    BP 108/68 mmHg  Pulse 67  Temp(Src) 98.1 F (36.7 C) (Oral)  Ht 6' 1.5" (1.867 m)  Wt 227 lb (102.967 kg)  BMI 29.54 kg/m2  SpO2 96% Physical Exam  Constitutional: He is oriented to person, place, and time. He appears well-developed and  well-nourished. No distress.  HENT:  Head: Normocephalic and atraumatic.  Right Ear: External ear normal.  Left Ear: External ear normal.  Nose: Nose normal.  Mouth/Throat: Oropharynx is clear and moist. No oropharyngeal exudate.  Eyes: Conjunctivae and EOM are normal. Pupils are equal, round, and reactive to light. Right eye exhibits no discharge. Left eye exhibits no discharge. No scleral icterus.  Neck: Normal range of motion. Neck supple. No tracheal deviation present. No thyromegaly present.  Cardiovascular: Normal rate, regular rhythm and normal heart sounds.  Exam reveals no gallop and no friction rub.   No murmur heard. Pulmonary/Chest: Effort normal and breath sounds normal. No accessory  muscle usage. No tachypnea. No respiratory distress. He has no decreased breath sounds. He has no wheezes. He has no rhonchi. He has no rales. He exhibits no tenderness.  Musculoskeletal: Normal range of motion. He exhibits no edema.  Lymphadenopathy:    He has no cervical adenopathy.  Neurological: He is alert and oriented to person, place, and time. No cranial nerve deficit. Coordination normal.  Skin: Skin is warm and dry. No rash noted. He is not diaphoretic. No erythema. No pallor.  Psychiatric: He has a normal mood and affect. His behavior is normal. Judgment and thought content normal.          Assessment & Plan:   Problem List Items Addressed This Visit      Unprioritized   Acute bronchitis - Primary    Upper respiratory infection with >2 weeks of productive cough. Pulmonary exam is normal. Cough likely coming from PND and upper airway congestion. Will treat with Augmentin. Tessalon for cough. Discussed risks of Augmentin in setting of chronic anticoagulation. He will check INR Monday. He will take probiotic with Augmentin. Follow up 2 weeks and prn.      Relevant Medications   amoxicillin-clavulanate (AUGMENTIN) 875-125 MG tablet   benzonatate (TESSALON) 200 MG capsule       Return in about 2 weeks (around 04/20/2015) for Recheck.  Ronette Deter, MD Internal Medicine [facility]

## 2015-04-06 NOTE — Progress Notes (Signed)
Pre visit review using our clinic review tool, if applicable. No additional management support is needed unless otherwise documented below in the visit note. 

## 2015-04-06 NOTE — Assessment & Plan Note (Signed)
Upper respiratory infection with >2 weeks of productive cough. Pulmonary exam is normal. Cough likely coming from PND and upper airway congestion. Will treat with Augmentin. Tessalon for cough. Discussed risks of Augmentin in setting of chronic anticoagulation. He will check INR Monday. He will take probiotic with Augmentin. Follow up 2 weeks and prn.

## 2015-04-07 ENCOUNTER — Ambulatory Visit: Payer: Medicare Other | Admitting: Internal Medicine

## 2015-04-20 ENCOUNTER — Ambulatory Visit: Payer: Medicare Other | Admitting: Family Medicine

## 2015-04-20 LAB — POCT INR: INR: 2.2

## 2015-04-23 ENCOUNTER — Ambulatory Visit (INDEPENDENT_AMBULATORY_CARE_PROVIDER_SITE_OTHER): Payer: Medicare Other | Admitting: Pharmacist Clinician (PhC)/ Clinical Pharmacy Specialist

## 2015-04-23 DIAGNOSIS — G459 Transient cerebral ischemic attack, unspecified: Secondary | ICD-10-CM

## 2015-04-23 DIAGNOSIS — Z7901 Long term (current) use of anticoagulants: Secondary | ICD-10-CM

## 2015-04-23 DIAGNOSIS — I359 Nonrheumatic aortic valve disorder, unspecified: Secondary | ICD-10-CM

## 2015-04-23 MED ORDER — WARFARIN SODIUM 2 MG PO TABS: ORAL_TABLET | ORAL | Status: DC

## 2015-04-23 MED ORDER — WARFARIN SODIUM 5 MG PO TABS: ORAL_TABLET | ORAL | Status: DC

## 2015-05-09 LAB — POCT INR: INR: 2.4

## 2015-05-10 ENCOUNTER — Ambulatory Visit (INDEPENDENT_AMBULATORY_CARE_PROVIDER_SITE_OTHER): Payer: Medicare Other | Admitting: Pharmacist Clinician (PhC)/ Clinical Pharmacy Specialist

## 2015-05-10 DIAGNOSIS — Z7901 Long term (current) use of anticoagulants: Secondary | ICD-10-CM

## 2015-05-10 DIAGNOSIS — G459 Transient cerebral ischemic attack, unspecified: Secondary | ICD-10-CM

## 2015-05-10 DIAGNOSIS — I359 Nonrheumatic aortic valve disorder, unspecified: Secondary | ICD-10-CM

## 2015-05-25 ENCOUNTER — Emergency Department
Admission: EM | Admit: 2015-05-25 | Discharge: 2015-05-25 | Disposition: A | Discharge status: Home / Self Care | Payer: Medicare Other | Attending: Emergency Medicine | Admitting: Emergency Medicine

## 2015-05-25 ENCOUNTER — Emergency Department: Payer: Medicare Other

## 2015-05-25 ENCOUNTER — Telehealth: Payer: Self-pay | Admitting: Family Medicine

## 2015-05-25 ENCOUNTER — Encounter: Payer: Self-pay | Admitting: Emergency Medicine

## 2015-05-25 DIAGNOSIS — H81399 Other peripheral vertigo, unspecified ear: Secondary | ICD-10-CM | POA: Insufficient documentation

## 2015-05-25 DIAGNOSIS — E785 Hyperlipidemia, unspecified: Secondary | ICD-10-CM | POA: Insufficient documentation

## 2015-05-25 DIAGNOSIS — Z7901 Long term (current) use of anticoagulants: Secondary | ICD-10-CM | POA: Insufficient documentation

## 2015-05-25 DIAGNOSIS — I069 Rheumatic aortic valve disease, unspecified: Secondary | ICD-10-CM | POA: Diagnosis not present

## 2015-05-25 DIAGNOSIS — Z87438 Personal history of other diseases of male genital organs: Secondary | ICD-10-CM | POA: Insufficient documentation

## 2015-05-25 DIAGNOSIS — Z79899 Other long term (current) drug therapy: Secondary | ICD-10-CM | POA: Insufficient documentation

## 2015-05-25 DIAGNOSIS — Z7982 Long term (current) use of aspirin: Secondary | ICD-10-CM | POA: Diagnosis not present

## 2015-05-25 DIAGNOSIS — I10 Essential (primary) hypertension: Secondary | ICD-10-CM | POA: Insufficient documentation

## 2015-05-25 DIAGNOSIS — R51 Headache: Secondary | ICD-10-CM | POA: Diagnosis present

## 2015-05-25 DIAGNOSIS — Z8673 Personal history of transient ischemic attack (TIA), and cerebral infarction without residual deficits: Secondary | ICD-10-CM | POA: Insufficient documentation

## 2015-05-25 DIAGNOSIS — E119 Type 2 diabetes mellitus without complications: Secondary | ICD-10-CM | POA: Diagnosis not present

## 2015-05-25 DIAGNOSIS — J449 Chronic obstructive pulmonary disease, unspecified: Secondary | ICD-10-CM | POA: Diagnosis not present

## 2015-05-25 DIAGNOSIS — Z8719 Personal history of other diseases of the digestive system: Secondary | ICD-10-CM | POA: Insufficient documentation

## 2015-05-25 HISTORY — DX: Cerebral infarction, unspecified: I63.9

## 2015-05-25 LAB — COMPREHENSIVE METABOLIC PANEL
ALT: 22 U/L (ref 17–63)
AST: 24 U/L (ref 15–41)
Albumin: 4.7 g/dL (ref 3.5–5.0)
Alkaline Phosphatase: 58 U/L (ref 38–126)
Anion gap: 7 (ref 5–15)
BUN: 24 mg/dL — ABNORMAL HIGH (ref 6–20)
CO2: 26 mmol/L (ref 22–32)
Calcium: 9.4 mg/dL (ref 8.9–10.3)
Chloride: 105 mmol/L (ref 101–111)
Creatinine, Ser: 1.2 mg/dL (ref 0.61–1.24)
GFR calc Af Amer: >60 mL/min (ref >60)
GFR calc non Af Amer: >60 mL/min (ref >60)
Glucose, Bld: 93 mg/dL (ref 65–99)
Potassium: 4.4 mmol/L (ref 3.5–5.1)
Sodium: 138 mmol/L (ref 135–145)
Total Bilirubin: 1.1 mg/dL (ref 0.3–1.2)
Total Protein: 7.8 g/dL (ref 6.5–8.1)

## 2015-05-25 LAB — DIFFERENTIAL
Basophils Absolute: 0 10*3/uL (ref 0–0.1)
Basophils Relative: 1 %
Eosinophils Absolute: 0.2 10*3/uL (ref 0–0.7)
Eosinophils Relative: 4 %
Lymphocytes Relative: 32 %
Lymphs Abs: 1.6 10*3/uL (ref 1.0–3.6)
Monocytes Absolute: 0.5 10*3/uL (ref 0.2–1.0)
Monocytes Relative: 11 %
Neutro Abs: 2.6 10*3/uL (ref 1.4–6.5)
Neutrophils Relative %: 52 %

## 2015-05-25 LAB — CBC
HCT: 39 % — ABNORMAL LOW (ref 40.0–52.0)
Hemoglobin: 13.6 g/dL (ref 13.0–18.0)
MCH: 31.1 pg (ref 26.0–34.0)
MCHC: 34.9 g/dL (ref 32.0–36.0)
MCV: 89.1 fL (ref 80.0–100.0)
Platelets: 184 10*3/uL (ref 150–440)
RBC: 4.37 MIL/uL — ABNORMAL LOW (ref 4.40–5.90)
RDW: 13.8 % (ref 11.5–14.5)
WBC: 4.9 10*3/uL (ref 3.8–10.6)

## 2015-05-25 LAB — PROTIME-INR
INR: 2.15
Prothrombin Time: 23.8 seconds — ABNORMAL HIGH (ref 11.4–15.0)

## 2015-05-25 LAB — APTT: aPTT: 37 seconds — ABNORMAL HIGH (ref 24–36)

## 2015-05-25 MED ORDER — MECLIZINE HCL 25 MG PO TABS: 12.5000 mg | ORAL_TABLET | Freq: Three times a day (TID) | ORAL | Status: DC | PRN

## 2015-05-25 NOTE — ED Provider Notes (Signed)
Time Seen: Approximately 1640 I have reviewed the triage notes  Chief Complaint: Headache and Altered Mental Status   History of Present Illness: Alexander Barton is a 65 y.o. male *who presents with recent onset this week starting on Monday with intermittent frontal headache, intermittent dizziness, and intermittent difficulties focusing. He states he has these symptoms occasionally in the seem very transient. He describes dizzy episodes mostly with movement especially describing when he had his head CT prior to my evaluation sitting up and down on the table. He denies being lightheaded. Eyes any trouble with speech or swallowing or any focal neurologic deficits. He denies any nausea or vomiting. He is on chronic Coumadin and has had a previous aortic valve disease and strokes and transient ischemic attacks. His Coumadin has been therapeutic. He denies any complications from the Coumadin such as GI bleeding etc. His last stroke was in February 2015. Currently he denies any trouble with vision, trouble with speech, etc.*   Past Medical History  Diagnosis Date  . HTN (hypertension)   . Hypercholesterolemia   . COPD (chronic obstructive pulmonary disease) ([place])   . GERD (gastroesophageal reflux disease)   . TIA (transient ischemic attack)   . Aortic aneurysm, thoracic ([place])   . Aortic valve disease   . BPH (benign prostatic hyperplasia)   . Arthritis   . Stroke [facility])     Patient Active Problem List   Diagnosis Date Noted  . Acute bronchitis 04/06/2015  . Chronic obstructive pulmonary disease ([place]) 11/14/2014  . BPH (benign prostatic hyperplasia) 11/14/2014  . Preventative health care 11/14/2014  . Long term current use of anticoagulant therapy 10/29/2013  . Type 2 diabetes mellitus ([place]) 06/02/2013  . Cerebral infarction ([place]) 06/02/2013  . CVA (cerebral vascular accident) ([place]) 09/29/2012  . Lipoma of abdominal wall 01/14/2012  . H/O aortic valve replacement 12/26/2011  .  History of aortic arch replacement 01/07/2011  . HTN (hypertension)   . Hyperlipidemia   . TIA (transient ischemic attack)   . Aortic aneurysm, thoracic ([place])   . Aortic valve disease     Past Surgical History  Procedure Laterality Date  . Aortic valve replacement      #23 On-X valve conduit  . Back surgery    . Foot surgery    . Achilles tendon repair    . Ascending aortic aneurysm repair      Past Surgical History  Procedure Laterality Date  . Aortic valve replacement      #23 On-X valve conduit  . Back surgery    . Foot surgery    . Achilles tendon repair    . Ascending aortic aneurysm repair      Current Outpatient Rx  Name  Route  Sig  Dispense  Refill  . amoxicillin-clavulanate (AUGMENTIN) 875-125 MG tablet   Oral   Take 1 tablet by mouth 2 (two) times daily.   20 tablet   0   . Apoaequorin (PREVAGEN) 10 MG CAPS   Oral   Take 10 mg by mouth.         Marland Kitchen aspirin 81 MG tablet   Oral   Take 81 mg by mouth every evening.          . benzonatate (TESSALON) 200 MG capsule   Oral   Take 1 capsule (200 mg total) by mouth 2 (two) times daily as needed for cough.   20 capsule   0   . Calcium Carbonate-Vitamin D (TGT CALCIUM DIETARY SUPPLEMENT  PO)   Oral   Take 12.9 g by mouth.         . Cinnamon 500 MG TABS   Oral   Take 2,000 mg by mouth.          . meclizine (ANTIVERT) 25 MG tablet   Oral   Take 0.5 tablets (12.5 mg total) by mouth 3 (three) times daily as needed for dizziness or nausea.   24 tablet   0   . metoprolol succinate (TOPROL-XL) 50 MG 24 hr tablet   Oral   Take 1 tablet (50 mg total) by mouth daily. Take with or immediately following a meal.   90 tablet   3   . Misc Natural Products (COLON CLEANSE PO)   Oral   Take 550 mg by mouth.         . Red Yeast Rice 600 MG TABS   Oral   Take 600 mg by mouth.         . rosuvastatin (CRESTOR) 20 MG tablet   Oral   Take 1 tablet (20 mg total) by mouth daily.   90 tablet   3   .  tamsulosin (FLOMAX) 0.4 MG CAPS capsule   Oral   Take 2 capsules (0.8 mg total) by mouth daily.   180 capsule   3     This is a one time refill from [employer], [facility],  ...   . warfarin (COUMADIN) 2 MG tablet      TAKE 1 TABLET WITH 5 MG TABLET DAILY AS DIRECTED BY COUMADIN CLINIC   90 tablet   1   . warfarin (COUMADIN) 5 MG tablet      Take 1-2 tablets by mouth daily with 2 mg tablet as directed by coumadin clinic   120 tablet   1     Allergies:  Codeine; Hydrocodone; Statins; Hydromorphone; Ivp dye; Lipitor; Other; and Seroquel  Family History: Family History  Problem Relation Age of Onset  . Alzheimer's disease Father   . Diabetes Father     age onset DM  . Hypertension Sister   . Hypertension Brother   . Diabetes Sister   . Diabetes Mother   . Colon cancer Neg Hx     Social History: Social History  Substance Use Topics  . Smoking status: Never Smoker   . Smokeless tobacco: Never Used  . Alcohol Use: No     Review of Systems:   10 point review of systems was performed and was otherwise negative:  Constitutional: No fever Eyes: No visual disturbances ENT: No sore throat, ear pain Cardiac: No chest pain Respiratory: No shortness of breath, wheezing, or stridor Abdomen: No abdominal pain, no vomiting, No diarrhea Endocrine: No weight loss, No night sweats Extremities: No peripheral edema, cyanosis Skin: No rashes, easy bruising Neurologic: No focal weakness, trouble with speech or swollowing Urologic: No dysuria, Hematuria, or urinary frequency   Physical Exam:  ED Triage Vitals  Enc Vitals Group     BP 05/25/15 1458 118/70 mmHg     Pulse Rate 05/25/15 1458 71     Resp 05/25/15 1458 20     Temp 05/25/15 1458 98.3 F (36.8 C)     Temp Source 05/25/15 1458 Oral     SpO2 05/25/15 1458 97 %     Weight 05/25/15 1458 230 lb (104.327 kg)     Height 05/25/15 1458 6\' 2"  (1.88 m)     Head Cir --  Peak Flow --      Pain Score 05/25/15 1503 3      Pain Loc --      Pain Edu? --      Excl. in [place]? --     General: Awake , Alert , and Oriented times 3; GCS 15 Head: Normal cephalic , atraumatic Eyes: Pupils equal , round, reactive to light Nose/Throat: No nasal drainage, patent upper airway without erythema or exudate.  Neck: Supple, Full range of motion, No anterior adenopathy or palpable thyroid masses Lungs: Clear to ascultation without wheezes , rhonchi, or rales Heart: Regular rate, regular rhythm without murmurs , gallops , or rubs Abdomen: Soft, non tender without rebound, guarding , or rigidity; bowel sounds positive and symmetric in all 4 quadrants. No organomegaly .        Extremities: 2 plus symmetric pulses. No edema, clubbing or cyanosis Neurologic: normal ambulation, Motor symmetric without deficits, sensory intact. Negative cerebellar signs Skin: warm, dry, no rashes   Labs:   All laboratory work was reviewed including any pertinent negatives or positives listed below:  Labs Reviewed  PROTIME-INR - Abnormal; Notable for the following:    Prothrombin Time 23.8 (*)    All other components within normal limits  APTT - Abnormal; Notable for the following:    aPTT 37 (*)    All other components within normal limits  CBC - Abnormal; Notable for the following:    RBC 4.37 (*)    HCT 39.0 (*)    All other components within normal limits  COMPREHENSIVE METABOLIC PANEL - Abnormal; Notable for the following:    BUN 24 (*)    All other components within normal limits  DIFFERENTIAL  CBG MONITORING, ED  Laboratory work was reviewed and showed no clinically significant abnormalities.   EKG:  ED ECG REPORT I, Daymon Larsen, the attending physician, personally viewed and interpreted this ECG.  Date: 05/25/2015 EKG Time: *1511 Rate: 69 Rhythm: normal sinus rhythm QRS Axis: normal Intervals: normal ST/T Wave abnormalities: normal Conduction Disturbances: none Narrative Interpretation: unremarkable Normal  EKG   Radiology: step x this am. Hx TIA x 2, CVA-2/3 years ago. Denies injury, surgery, and CA.  EXAM: CT HEAD WITHOUT CONTRAST  TECHNIQUE: Contiguous axial images were obtained from the base of the skull through the vertex without intravenous contrast.  COMPARISON: 06/04/2012 CT and MRI  FINDINGS: There is no intra or extra-axial fluid collection or mass lesion. The basilar cisterns and ventricles have a normal appearance. There is no CT evidence for acute infarction or hemorrhage. There is mild mucoperiosteal thickening of the paranasal sinuses. No air-fluid levels. Bone windows are otherwise unremarkable.  IMPRESSION: No evidence for acute intracranial abnormality.     I personally reviewed the radiologic studies   P ED Course:  Patient's stay here was uneventful and he currently has no focal neurologic deficits. I felt those were unlikely to be transient ischemic attacks or impending stroke that advised the patient that we cannot be 100% sure. His symptoms seem to be more consistent with vertiginous peripheral vertigo with is exhaustive nature of his symptoms. She is therapeutic on his Coumadin and I felt there was nothing to add by hospitalizing him at this time. The patient and his wife seemed to be of understanding,  and he was given a prescription for meclizine and advised to rest this weekend to contact his primary physician for further outpatient follow-up and possible repeat it carotid Dopplers, echocardiogram etc. He has the  capability of checking his Coumadin at home and is been advised to continue with that and currently appears to be therapeutic.    Assessment:  Peripheral vertigo   Final Clinical Impression:   Final diagnoses:  Peripheral vertigo, unspecified laterality     Plan:  Outpatient management Patient was advised to return immediately if condition worsens. Patient was advised to follow up with their primary care physician or other  specialized physicians involved in their outpatient care. The patient and/or family member/power of attorney had laboratory results reviewed at the bedside. All questions and concerns were addressed and appropriate discharge instructions were distributed by the nursing staff.            Daymon Larsen, MD 05/25/15 [phone]

## 2015-05-25 NOTE — Telephone Encounter (Signed)
Patient needs to be seen at a walk-in per [facility]

## 2015-05-25 NOTE — Discharge Instructions (Signed)
Dizziness Dizziness is a common problem. It makes you feel unsteady or lightheaded. You may feel like you are about to pass out (faint). Dizziness can lead to injury if you stumble or fall. Anyone can get dizzy, but dizziness is more common in older adults. This condition can be caused by a number of things, including:  Medicines.  Dehydration.  Illness. HOME CARE Following these instructions may help with your condition: Eating and Drinking  Drink enough fluid to keep your pee (urine) clear or pale yellow. This helps to keep you from getting dehydrated. Try to drink more clear fluids, such as water.  Do not drink alcohol.  Limit how much caffeine you drink or eat if told by your doctor.  Limit how much salt you drink or eat if told by your doctor. Activity  Avoid making quick movements.  When you stand up from sitting in a chair, steady yourself until you feel okay.  In the morning, first sit up on the side of the bed. When you feel okay, stand slowly while you hold onto something. Do this until you know that your balance is fine.  Move your legs often if you need to stand in one place for a long time. Tighten and relax your muscles in your legs while you are standing.  Do not drive or use heavy machinery if you feel dizzy.  Avoid bending down if you feel dizzy. Place items in your home so that they are easy for you to reach without leaning over. Lifestyle  Do not use any tobacco products, including cigarettes, chewing tobacco, or electronic cigarettes. If you need help quitting, ask your doctor.  Try to lower your stress level, such as with yoga or meditation. Talk with your doctor if you need help. General Instructions  Watch your dizziness for any changes.  Take medicines only as told by your doctor. Talk with your doctor if you think that your dizziness is caused by a medicine that you are taking.  Tell a friend or a family member that you are feeling dizzy. If he or  she notices any changes in your behavior, have this person call your doctor.  Keep all follow-up visits as told by your doctor. This is important. GET HELP IF:  Your dizziness does not go away.  Your dizziness or light-headedness gets worse.  You feel sick to your stomach (nauseous).  You have trouble hearing.  You have new symptoms.  You are unsteady on your feet or you feel like the room is spinning. GET HELP RIGHT AWAY IF:  You throw up (vomit) or have diarrhea and are unable to eat or drink anything.  You have trouble:  Talking.  Walking.  Swallowing.  Using your arms, hands, or legs.  You feel generally weak.  You are not thinking clearly or you have trouble forming sentences. It may take a friend or family member to notice this.  You have:  Chest pain.  Pain in your belly (abdomen).  Shortness of breath.  Sweating.  Your vision changes.  You are bleeding.  You have a headache.  You have neck pain or a stiff neck.  You have a fever.   This information is not intended to replace advice given to you by your health care provider. Make sure you discuss any questions you have with your health care provider.   Document Released: 01/09/2011 Document Revised: 06/06/2014 Document Reviewed: 01/16/2014 Elsevier Interactive Patient Education [employer].  Please return immediately if  condition worsens. Please contact her primary physician or the physician you were given for referral. If you have any specialist physicians involved in her treatment and plan please also contact them. Thank you for using [facility].

## 2015-05-25 NOTE — Telephone Encounter (Signed)
[facility] Patient Name: Alexander Barton DOB: 1950/10/20 Initial Comment Caller states he had a headache, point of not thinking clearly Monday. Has headache since. BP is a little up, but okay. Right now dizzy, thinking not quick and sharp as normal, headache. Some confusion. Nurse Assessment Nurse: Dimas Chyle, RN, Dellis Filbert Date/Time Eilene Ghazi Time): 05/25/2015 12:14:34 PM Confirm and document reason for call. If symptomatic, describe symptoms. You must click the next button to save text entered. ---Caller states he had a headache, point of not thinking clearly Monday. Has headache since. BP is a little up, but okay. Right now dizzy, thinking not quick and sharp as normal, headache. Some confusion. Has the patient traveled out of the country within the last 30 days? ---No Does the patient have any new or worsening symptoms? ---Yes Will a triage be completed? ---Yes Related visit to physician within the last 2 weeks? ---No Does the PT have any chronic conditions? (i.e. diabetes, asthma, etc.) ---Yes List chronic conditions. ---HTN Is this a behavioral health or substance abuse call? ---No Guidelines Guideline Title Affirmed Question Affirmed Notes Headache [1] MODERATE headache (e.g., interferes with normal activities) AND [2] present > 24 hours AND [3] unexplained (Exceptions: analgesics not tried, typical migraine, or headache part of viral illness) Final Disposition User See Physician within 24 Hours Dimas Chyle, RN, [employer] Referrals REFERRED TO PCP OFFICE Disagree/Comply:

## 2015-05-25 NOTE — ED Notes (Signed)
Patient states that on Monday morning around 1100 started having intermittent periods of disorientation and headache all week.  This morning at around 1100 started feeling dizzy, dizziness has almost resolved.  Still c/o "nagging" headache.  Patient 's spouse states patient has had strokes in the past and symptoms begin with disorientation and headaches.  Last CVA 03/2013.

## 2015-05-25 NOTE — Telephone Encounter (Signed)
Patient advised, thanks.

## 2015-05-25 NOTE — Telephone Encounter (Signed)
Please follow up with this patient. Thanks

## 2015-05-28 ENCOUNTER — Ambulatory Visit: Payer: Self-pay | Admitting: Family Medicine

## 2015-06-01 LAB — POCT INR: INR: 2.4

## 2015-06-04 ENCOUNTER — Ambulatory Visit (INDEPENDENT_AMBULATORY_CARE_PROVIDER_SITE_OTHER): Payer: Medicare Other | Admitting: Pharmacist

## 2015-06-04 DIAGNOSIS — I359 Nonrheumatic aortic valve disorder, unspecified: Secondary | ICD-10-CM

## 2015-06-04 DIAGNOSIS — G459 Transient cerebral ischemic attack, unspecified: Secondary | ICD-10-CM

## 2015-06-04 DIAGNOSIS — Z7901 Long term (current) use of anticoagulants: Secondary | ICD-10-CM

## 2015-06-19 ENCOUNTER — Other Ambulatory Visit: Payer: Self-pay | Admitting: Cardiology

## 2015-06-23 LAB — POCT INR: INR: 3.4

## 2015-06-25 ENCOUNTER — Ambulatory Visit (INDEPENDENT_AMBULATORY_CARE_PROVIDER_SITE_OTHER): Payer: Medicare Other | Admitting: Pharmacist

## 2015-06-25 DIAGNOSIS — Z7901 Long term (current) use of anticoagulants: Secondary | ICD-10-CM

## 2015-06-25 DIAGNOSIS — G459 Transient cerebral ischemic attack, unspecified: Secondary | ICD-10-CM

## 2015-06-25 DIAGNOSIS — I359 Nonrheumatic aortic valve disorder, unspecified: Secondary | ICD-10-CM

## 2015-07-12 LAB — POCT INR: INR: 2.1

## 2015-07-13 ENCOUNTER — Ambulatory Visit (INDEPENDENT_AMBULATORY_CARE_PROVIDER_SITE_OTHER): Payer: Medicare Other | Admitting: Pharmacist

## 2015-07-13 DIAGNOSIS — G459 Transient cerebral ischemic attack, unspecified: Secondary | ICD-10-CM

## 2015-07-13 DIAGNOSIS — I359 Nonrheumatic aortic valve disorder, unspecified: Secondary | ICD-10-CM

## 2015-07-13 DIAGNOSIS — Z7901 Long term (current) use of anticoagulants: Secondary | ICD-10-CM

## 2015-07-27 ENCOUNTER — Ambulatory Visit (INDEPENDENT_AMBULATORY_CARE_PROVIDER_SITE_OTHER): Payer: Medicare Other | Admitting: Pharmacist Clinician (PhC)/ Clinical Pharmacy Specialist

## 2015-07-27 DIAGNOSIS — I359 Nonrheumatic aortic valve disorder, unspecified: Secondary | ICD-10-CM

## 2015-07-27 DIAGNOSIS — Z7901 Long term (current) use of anticoagulants: Secondary | ICD-10-CM

## 2015-07-27 DIAGNOSIS — G459 Transient cerebral ischemic attack, unspecified: Secondary | ICD-10-CM | POA: Diagnosis not present

## 2015-07-27 LAB — POCT INR: INR: 2.8

## 2015-08-16 LAB — PROTIME-INR

## 2015-08-16 LAB — POCT INR: INR: 2.3

## 2015-08-17 ENCOUNTER — Encounter: Payer: Self-pay | Admitting: Family Medicine

## 2015-08-17 ENCOUNTER — Other Ambulatory Visit: Payer: Medicare Other

## 2015-08-17 ENCOUNTER — Ambulatory Visit (INDEPENDENT_AMBULATORY_CARE_PROVIDER_SITE_OTHER): Payer: Medicare Other | Admitting: Family Medicine

## 2015-08-17 VITALS — BP 124/72 | HR 84 | Temp 97.7°F | Wt 220.0 lb

## 2015-08-17 DIAGNOSIS — A059 Bacterial foodborne intoxication, unspecified: Secondary | ICD-10-CM

## 2015-08-17 LAB — CBC
HCT: 39.5 % (ref 39.0–52.0)
Hemoglobin: 13.5 g/dL (ref 13.0–17.0)
MCHC: 34.3 g/dL (ref 30.0–36.0)
MCV: 88.4 fl (ref 78.0–100.0)
Platelets: 153 10*3/uL (ref 150.0–400.0)
RBC: 4.47 Mil/uL (ref 4.22–5.81)
RDW: 13.6 % (ref 11.5–15.5)
WBC: 4.2 10*3/uL (ref 4.0–10.5)

## 2015-08-17 LAB — COMPREHENSIVE METABOLIC PANEL
ALT: 20 U/L (ref 0–53)
AST: 29 U/L (ref 0–37)
Albumin: 4.3 g/dL (ref 3.5–5.2)
Alkaline Phosphatase: 51 U/L (ref 39–117)
BUN: 27 mg/dL — ABNORMAL HIGH (ref 6–23)
CO2: 26 mEq/L (ref 19–32)
Calcium: 9.2 mg/dL (ref 8.4–10.5)
Chloride: 99 mEq/L (ref 96–112)
Creatinine, Ser: 1.42 mg/dL (ref 0.40–1.50)
GFR: 52.87 mL/min — ABNORMAL LOW (ref >60.00)
Glucose, Bld: 118 mg/dL — ABNORMAL HIGH (ref 70–99)
Potassium: 4.1 mEq/L (ref 3.5–5.1)
Sodium: 134 mEq/L — ABNORMAL LOW (ref 135–145)
Total Bilirubin: 1.3 mg/dL — ABNORMAL HIGH (ref 0.2–1.2)
Total Protein: 7.6 g/dL (ref 6.0–8.3)

## 2015-08-17 MED ORDER — CIPROFLOXACIN HCL 500 MG PO TABS: 500.0000 mg | ORAL_TABLET | Freq: Two times a day (BID) | ORAL | Status: DC

## 2015-08-17 NOTE — Progress Notes (Signed)
Subjective:  Patient ID: Alexander Barton, male    DOB: 1950/12/22  Age: 65 y.o. MRN: [id number]  CC: ? Food poisoning  HPI:  65 year old male with a complicated past medical history including thoracic aortic aneurysm, aortic valve disease status post replacement, COPD, stroke, DM 2, hyperlipidemia, hypertension presents with the above complaint.  Patient states that on Saturday he was at the beach and ate some seafood (oysters and shrimp). He states that on Monday his daughter got quite sick with nausea, vomiting, and diarrhea. Patient states that he developed symptoms on Tuesday. He began to experience headache, fever, nausea, diarrhea. No vomiting. He states that his daughter is currently in the hospital and was diagnosed with Salmonella. He feels that since his symptoms are consistent with her is that this may be the case for him as well. He's had one episode of some blood with wiping which she attributes to irritation from diarrhea. He states that the headache is still intermittent. Fever and nausea has resolved. Diarrhea is improving. No medications tried. He has been increasing his fluid intake to prevent dehydration.  Social Hx   Social History   Social History  . Marital Status: Married    Spouse Name: N/A  . Number of Children: 2  . Years of Education: N/A   Occupational History  . SALES    Social History Main Topics  . Smoking status: Never Smoker   . Smokeless tobacco: Never Used  . Alcohol Use: No  . Drug Use: No  . Sexual Activity: Yes   Other Topics Concern  . 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   One son and one daughter   He lives in [place]   One caffeinated beverage daily   08/30/2014      Review of Systems  Constitutional: Positive for fever and fatigue.  Gastrointestinal: Positive for nausea and diarrhea.  Genitourinary:       Decreased urine output.     Objective:  BP 124/72 mmHg  Pulse 84  Temp(Src) 97.7 F (36.5 C) (Oral)  Wt 220 lb (99.791 kg)  SpO2 94%  BP/Weight 08/17/2015 [id number]  Systolic BP [id number] [id number]  Diastolic BP 72 73 68  Wt. (Lbs) 220 230 227  BMI 28.23 29.52 29.54   Physical Exam  Constitutional: He is oriented to person, place, and time.  Appears fatigued but in NAD.  Cardiovascular: Normal rate and regular rhythm.   Murmur noted secondary to artificial valve.  Pulmonary/Chest: Effort normal. He has no wheezes. He has no rales.  Abdominal: Soft. He exhibits no distension. There is no tenderness. There is no rebound and no guarding.  Neurological: He is alert and oriented to person, place, and time.  Psychiatric: He has a normal mood and affect.  Vitals reviewed.  Lab Results  Component Value Date   WBC 4.9 05/25/2015   HGB 13.6 05/25/2015   HCT 39.0* 05/25/2015   PLT 184 05/25/2015   GLUCOSE 93 05/25/2015   CHOL 254* 11/14/2014   TRIG 202.0* 11/14/2014   HDL 48.50 11/14/2014   LDLDIRECT 172.0 11/14/2014   LDLCALC 169* 06/04/2012   ALT 22 05/25/2015   AST 24 05/25/2015   NA 138 05/25/2015   K 4.4 05/25/2015   CL 105 05/25/2015   CREATININE 1.20 05/25/2015   BUN 24* 05/25/2015   CO2 26 05/25/2015   TSH 1.78 12/10/2010  PSA 0.34 11/14/2014   INR 2.8 07/27/2015   HGBA1C 5.9* 06/04/2012   Assessment & Plan:   Problem List Items Addressed This Visit    Food poisoning - Primary    New problem. Present diagnosis based on history and daughter with similar illness. Labs today. Treating empirically with Cipro.      Relevant Orders   Gastrointestinal Pathogen Panel PCR   CBC   Comprehensive metabolic panel     Meds ordered this encounter  Medications  . ciprofloxacin (CIPRO) 500 MG tablet    Sig: Take 1 tablet (500 mg total) by mouth 2 (two) times daily.    Dispense:  14 tablet    Refill:  0    Follow-up: PRN  [place]

## 2015-08-17 NOTE — Patient Instructions (Signed)
Take the medication as prescribed.  Follow up closely with me.  Take care  Dr. Lacinda Axon

## 2015-08-17 NOTE — Assessment & Plan Note (Signed)
New problem. Present diagnosis based on history and daughter with similar illness. Labs today. Treating empirically with Cipro.

## 2015-08-20 ENCOUNTER — Ambulatory Visit (INDEPENDENT_AMBULATORY_CARE_PROVIDER_SITE_OTHER): Payer: Medicare Other | Admitting: Pharmacist Clinician (PhC)/ Clinical Pharmacy Specialist

## 2015-08-20 DIAGNOSIS — Z7901 Long term (current) use of anticoagulants: Secondary | ICD-10-CM

## 2015-08-20 DIAGNOSIS — I359 Nonrheumatic aortic valve disorder, unspecified: Secondary | ICD-10-CM

## 2015-08-23 LAB — PROTIME-INR

## 2015-08-23 LAB — POCT INR: INR: 4.1

## 2015-08-24 ENCOUNTER — Telehealth: Payer: Self-pay | Admitting: Family Medicine

## 2015-08-24 ENCOUNTER — Ambulatory Visit (INDEPENDENT_AMBULATORY_CARE_PROVIDER_SITE_OTHER): Payer: Medicare Other | Admitting: Pharmacist

## 2015-08-24 DIAGNOSIS — I359 Nonrheumatic aortic valve disorder, unspecified: Secondary | ICD-10-CM

## 2015-08-24 DIAGNOSIS — Z7901 Long term (current) use of anticoagulants: Secondary | ICD-10-CM

## 2015-08-24 NOTE — Telephone Encounter (Signed)
FYI delay in testing results thanks

## 2015-08-24 NOTE — Telephone Encounter (Signed)
Elberta Fortis [id number] called from [facility] regarding a Test Delay for pt Gastro panel. Thank you!

## 2015-08-27 LAB — OTHER SOLSTAS TEST

## 2015-08-29 ENCOUNTER — Encounter: Payer: Self-pay | Admitting: Cardiology

## 2015-08-30 ENCOUNTER — Encounter: Payer: Self-pay | Admitting: Cardiology

## 2015-08-31 ENCOUNTER — Ambulatory Visit (INDEPENDENT_AMBULATORY_CARE_PROVIDER_SITE_OTHER): Payer: Medicare Other | Admitting: Pharmacist

## 2015-08-31 ENCOUNTER — Other Ambulatory Visit: Payer: Self-pay | Admitting: Family Medicine

## 2015-08-31 DIAGNOSIS — I359 Nonrheumatic aortic valve disorder, unspecified: Secondary | ICD-10-CM

## 2015-08-31 DIAGNOSIS — Z7901 Long term (current) use of anticoagulants: Secondary | ICD-10-CM

## 2015-08-31 LAB — POCT INR: INR: 2.4

## 2015-08-31 MED ORDER — METRONIDAZOLE 500 MG PO TABS: 500.0000 mg | ORAL_TABLET | Freq: Three times a day (TID) | ORAL | 0 refills | Status: DC

## 2015-09-05 LAB — PROTIME-INR

## 2015-09-05 LAB — POCT INR: INR: 4.3

## 2015-09-06 ENCOUNTER — Ambulatory Visit (INDEPENDENT_AMBULATORY_CARE_PROVIDER_SITE_OTHER): Payer: Medicare Other | Admitting: Pharmacist

## 2015-09-06 DIAGNOSIS — I359 Nonrheumatic aortic valve disorder, unspecified: Secondary | ICD-10-CM

## 2015-09-06 DIAGNOSIS — Z7901 Long term (current) use of anticoagulants: Secondary | ICD-10-CM

## 2015-09-10 ENCOUNTER — Telehealth: Payer: Self-pay | Admitting: *Deleted

## 2015-09-10 NOTE — Telephone Encounter (Signed)
Patient was under the impression that he was to have a 30 day supply of flagyl ,however he only received a ten day supple. He questioned if he would need another Rx Pt contact [phone]

## 2015-09-11 NOTE — Telephone Encounter (Signed)
Patient requested a follow up on this issue  Pt contact  [phone]

## 2015-09-11 NOTE — Telephone Encounter (Signed)
10 day course is the correct treatment.

## 2015-09-11 NOTE — Telephone Encounter (Signed)
Notified pt. Patient quested being retested to confirm C Dif being treated, advised pt that if he was not having watery-diarrhea that the lab would not test stool, advised pt that a probiotic could be helpful to balance good/bad bacteria.

## 2015-09-13 ENCOUNTER — Encounter: Payer: Self-pay | Admitting: Cardiology

## 2015-09-13 LAB — POCT INR: INR: 3.8

## 2015-09-14 ENCOUNTER — Ambulatory Visit (INDEPENDENT_AMBULATORY_CARE_PROVIDER_SITE_OTHER): Payer: Medicare Other | Admitting: Pharmacist

## 2015-09-14 DIAGNOSIS — Z7901 Long term (current) use of anticoagulants: Secondary | ICD-10-CM

## 2015-09-14 DIAGNOSIS — I359 Nonrheumatic aortic valve disorder, unspecified: Secondary | ICD-10-CM

## 2015-09-19 ENCOUNTER — Other Ambulatory Visit: Payer: Self-pay | Admitting: Cardiology

## 2015-09-19 LAB — POCT INR: INR: 2.5

## 2015-09-20 ENCOUNTER — Ambulatory Visit (INDEPENDENT_AMBULATORY_CARE_PROVIDER_SITE_OTHER): Payer: Medicare Other | Admitting: Pharmacist

## 2015-09-20 DIAGNOSIS — Z7901 Long term (current) use of anticoagulants: Secondary | ICD-10-CM

## 2015-09-20 DIAGNOSIS — I359 Nonrheumatic aortic valve disorder, unspecified: Secondary | ICD-10-CM

## 2015-09-26 ENCOUNTER — Telehealth: Payer: Self-pay

## 2015-09-26 NOTE — Telephone Encounter (Signed)
Noted. Will follow.  

## 2015-09-26 NOTE — Telephone Encounter (Signed)
Patient is on the list for [employer] 2017 and may be a good candidate for an AWV in 2017. Please let me know if/when appt is scheduled.   

## 2015-10-03 ENCOUNTER — Ambulatory Visit (INDEPENDENT_AMBULATORY_CARE_PROVIDER_SITE_OTHER): Payer: Medicare Other | Admitting: Family Medicine

## 2015-10-03 ENCOUNTER — Encounter (INDEPENDENT_AMBULATORY_CARE_PROVIDER_SITE_OTHER): Payer: Self-pay

## 2015-10-03 ENCOUNTER — Encounter: Payer: Self-pay | Admitting: Family Medicine

## 2015-10-03 VITALS — BP 114/74 | HR 64 | Temp 97.8°F | Wt 226.4 lb

## 2015-10-03 DIAGNOSIS — E785 Hyperlipidemia, unspecified: Secondary | ICD-10-CM

## 2015-10-03 DIAGNOSIS — E1159 Type 2 diabetes mellitus with other circulatory complications: Secondary | ICD-10-CM

## 2015-10-03 DIAGNOSIS — A029 Salmonella infection, unspecified: Secondary | ICD-10-CM

## 2015-10-03 DIAGNOSIS — Z7901 Long term (current) use of anticoagulants: Secondary | ICD-10-CM

## 2015-10-03 DIAGNOSIS — A0472 Enterocolitis due to Clostridium difficile, not specified as recurrent: Secondary | ICD-10-CM

## 2015-10-03 DIAGNOSIS — M25511 Pain in right shoulder: Secondary | ICD-10-CM

## 2015-10-03 DIAGNOSIS — I10 Essential (primary) hypertension: Secondary | ICD-10-CM | POA: Diagnosis not present

## 2015-10-03 DIAGNOSIS — A020 Salmonella enteritis: Secondary | ICD-10-CM

## 2015-10-03 DIAGNOSIS — A047 Enterocolitis due to Clostridium difficile: Secondary | ICD-10-CM | POA: Diagnosis not present

## 2015-10-03 MED ORDER — METHYLPREDNISOLONE ACETATE 80 MG/ML IJ SUSP
80.0000 mg | Freq: Once | INTRAMUSCULAR | Status: AC
  Administered 2015-10-03: 80 mg via INTRA_ARTICULAR

## 2015-10-03 NOTE — Progress Notes (Signed)
Pre visit review using our clinic review tool, if applicable. No additional management support is needed unless otherwise documented below in the visit note. 

## 2015-10-03 NOTE — Patient Instructions (Addendum)
We will check your labs today.  Follow up in 6 months.  Take care  Dr. Lacinda Axon

## 2015-10-04 DIAGNOSIS — M25511 Pain in right shoulder: Secondary | ICD-10-CM | POA: Insufficient documentation

## 2015-10-04 DIAGNOSIS — A029 Salmonella infection, unspecified: Secondary | ICD-10-CM | POA: Insufficient documentation

## 2015-10-04 DIAGNOSIS — A0472 Enterocolitis due to Clostridium difficile, not specified as recurrent: Secondary | ICD-10-CM | POA: Insufficient documentation

## 2015-10-04 LAB — LIPID PANEL
Cholesterol: 174 mg/dL (ref 0–200)
HDL: 47.2 mg/dL (ref >39.00)
LDL Cholesterol: 87 mg/dL (ref 0–99)
NonHDL: 126.3
Total CHOL/HDL Ratio: 4
Triglycerides: 195 mg/dL — ABNORMAL HIGH (ref 0.0–149.0)
VLDL: 39 mg/dL (ref 0.0–40.0)

## 2015-10-04 LAB — COMPREHENSIVE METABOLIC PANEL
ALT: 23 U/L (ref 0–53)
AST: 22 U/L (ref 0–37)
Albumin: 4.6 g/dL (ref 3.5–5.2)
Alkaline Phosphatase: 62 U/L (ref 39–117)
BUN: 23 mg/dL (ref 6–23)
CO2: 29 mEq/L (ref 19–32)
Calcium: 9.5 mg/dL (ref 8.4–10.5)
Chloride: 102 mEq/L (ref 96–112)
Creatinine, Ser: 1.36 mg/dL (ref 0.40–1.50)
GFR: 55.55 mL/min — ABNORMAL LOW (ref >60.00)
Glucose, Bld: 86 mg/dL (ref 70–99)
Potassium: 4.4 mEq/L (ref 3.5–5.1)
Sodium: 136 mEq/L (ref 135–145)
Total Bilirubin: 0.8 mg/dL (ref 0.2–1.2)
Total Protein: 7.7 g/dL (ref 6.0–8.3)

## 2015-10-04 LAB — CBC
HCT: 39.7 % (ref 39.0–52.0)
Hemoglobin: 13.8 g/dL (ref 13.0–17.0)
MCHC: 34.7 g/dL (ref 30.0–36.0)
MCV: 89.4 fl (ref 78.0–100.0)
Platelets: 171 10*3/uL (ref 150.0–400.0)
RBC: 4.45 Mil/uL (ref 4.22–5.81)
RDW: 14.7 % (ref 11.5–15.5)
WBC: 4.8 10*3/uL (ref 4.0–10.5)

## 2015-10-04 LAB — HEMOGLOBIN A1C: Hgb A1c MFr Bld: 6.2 % (ref 4.6–6.5)

## 2015-10-04 NOTE — Assessment & Plan Note (Signed)
Resolved. Doing well.

## 2015-10-04 NOTE — Assessment & Plan Note (Signed)
Now resolved following treatment.
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
## 2015-10-04 NOTE — Assessment & Plan Note (Signed)
New problem. Secondary to rotator cuff tear/tendinopathy. After lengthy discussion of treatment options, patient elected for injection. Cortical steroid injection done today without difficulty.

## 2015-10-09 ENCOUNTER — Ambulatory Visit (INDEPENDENT_AMBULATORY_CARE_PROVIDER_SITE_OTHER): Payer: Medicare Other | Admitting: Pharmacist Clinician (PhC)/ Clinical Pharmacy Specialist

## 2015-10-09 DIAGNOSIS — Z7901 Long term (current) use of anticoagulants: Secondary | ICD-10-CM

## 2015-10-09 DIAGNOSIS — I359 Nonrheumatic aortic valve disorder, unspecified: Secondary | ICD-10-CM

## 2015-10-09 LAB — POCT INR: INR: 1.9

## 2015-10-17 ENCOUNTER — Other Ambulatory Visit: Payer: Self-pay | Admitting: Cardiology

## 2015-10-19 NOTE — Telephone Encounter (Signed)
Appointment scheduled 12/05/15.

## 2015-10-29 ENCOUNTER — Encounter: Payer: Self-pay | Admitting: Cardiology

## 2015-10-29 LAB — POCT INR: INR: 3.6

## 2015-10-29 LAB — PROTIME-INR

## 2015-10-30 ENCOUNTER — Ambulatory Visit (INDEPENDENT_AMBULATORY_CARE_PROVIDER_SITE_OTHER): Payer: Medicare Other | Admitting: Pharmacist

## 2015-10-30 DIAGNOSIS — Z7901 Long term (current) use of anticoagulants: Secondary | ICD-10-CM

## 2015-10-30 DIAGNOSIS — I359 Nonrheumatic aortic valve disorder, unspecified: Secondary | ICD-10-CM

## 2015-11-04 ENCOUNTER — Other Ambulatory Visit: Payer: Self-pay | Admitting: Family Medicine

## 2015-11-04 DIAGNOSIS — Z7901 Long term (current) use of anticoagulants: Secondary | ICD-10-CM

## 2015-11-04 DIAGNOSIS — E7800 Pure hypercholesterolemia, unspecified: Secondary | ICD-10-CM

## 2015-11-04 DIAGNOSIS — I10 Essential (primary) hypertension: Secondary | ICD-10-CM

## 2015-11-04 DIAGNOSIS — E119 Type 2 diabetes mellitus without complications: Secondary | ICD-10-CM

## 2015-11-04 DIAGNOSIS — I359 Nonrheumatic aortic valve disorder, unspecified: Secondary | ICD-10-CM

## 2015-11-05 MED ORDER — ROSUVASTATIN CALCIUM 20 MG PO TABS: 20.0000 mg | ORAL_TABLET | Freq: Every day | ORAL | 3 refills | Status: DC

## 2015-11-05 NOTE — Telephone Encounter (Signed)
refilled 11/14/14. Pt last seen 10/03/15. Please advise?

## 2015-11-09 ENCOUNTER — Telehealth: Payer: Self-pay | Admitting: Cardiology

## 2015-11-09 NOTE — Telephone Encounter (Signed)
Received records from [facility] for appointment on 12/13/15 with Dr [name].  Records given to [facility] (medical records ) for Dr Doug Sou schedule on 12/13/15. lp

## 2015-11-14 ENCOUNTER — Encounter: Payer: Self-pay | Admitting: Cardiology

## 2015-11-14 LAB — POCT INR: INR: 3.8

## 2015-11-14 LAB — PROTIME-INR

## 2015-11-15 ENCOUNTER — Ambulatory Visit (INDEPENDENT_AMBULATORY_CARE_PROVIDER_SITE_OTHER): Payer: Medicare Other | Admitting: Pharmacist

## 2015-11-15 DIAGNOSIS — Z7901 Long term (current) use of anticoagulants: Secondary | ICD-10-CM

## 2015-11-15 DIAGNOSIS — I359 Nonrheumatic aortic valve disorder, unspecified: Secondary | ICD-10-CM

## 2015-11-30 ENCOUNTER — Ambulatory Visit (INDEPENDENT_AMBULATORY_CARE_PROVIDER_SITE_OTHER): Payer: Medicare Other | Admitting: Pharmacist Clinician (PhC)/ Clinical Pharmacy Specialist

## 2015-11-30 DIAGNOSIS — Z7901 Long term (current) use of anticoagulants: Secondary | ICD-10-CM

## 2015-11-30 DIAGNOSIS — I359 Nonrheumatic aortic valve disorder, unspecified: Secondary | ICD-10-CM

## 2015-11-30 LAB — POCT INR: INR: 3.1

## 2015-12-05 ENCOUNTER — Ambulatory Visit (INDEPENDENT_AMBULATORY_CARE_PROVIDER_SITE_OTHER): Payer: Medicare Other

## 2015-12-05 VITALS — BP 110/80 | HR 66 | Resp 14 | Ht 73.0 in | Wt 225.4 lb

## 2015-12-05 DIAGNOSIS — Z0000 Encounter for general adult medical examination without abnormal findings: Secondary | ICD-10-CM

## 2015-12-05 DIAGNOSIS — Z23 Encounter for immunization: Secondary | ICD-10-CM

## 2015-12-05 NOTE — Patient Instructions (Addendum)
Alexander Barton , Thank you for taking time to come for your Medicare Wellness Visit. I appreciate your ongoing commitment to your health goals. Please review the following plan we discussed and let me know if I can assist you in the future.   These are the goals we discussed: Goals    . Increase physical activity          Join the [facility] and start the [employer] program with his wife.       This is a list of the screening recommended for you and due dates:  Health Maintenance  Topic Date Due  .  Hepatitis C: One time screening is recommended by Center for Disease Control  (CDC) for  adults born from [birth year] through 1967.   01-20-1951  . Complete foot exam   09/24/1958  . Eye exam for diabetics  09/24/1958  . Urine Protein Check  09/24/1958  . Pneumonia vaccines (2 of 2 - PPSV23) 11/14/2015  . Flu Shot  05/02/2016*  . Hemoglobin A1C  04/02/2016  . Tetanus Vaccine  02/03/2021  . Colon Cancer Screening  10/11/2024  . Shingles Vaccine  Addressed  *Topic was postponed. The date shown is not the original due date.      Fall Prevention in the Home  Falls can cause injuries. They can happen to people of all ages. There are many things you can do to make your home safe and to help prevent falls.  WHAT CAN I DO ON THE OUTSIDE OF MY HOME?  Regularly fix the edges of walkways and driveways and fix any cracks.  Remove anything that might make you trip as you walk through a door, such as a raised step or threshold.  Trim any bushes or trees on the path to your home.  Use bright outdoor lighting.  Clear any walking paths of anything that might make someone trip, such as rocks or tools.  Regularly check to see if handrails are loose or broken. Make sure that both sides of any steps have handrails.  Any raised decks and porches should have guardrails on the edges.  Have any leaves, snow, or ice cleared regularly.  Use sand or salt on walking paths during winter.  Clean up any spills  in your garage right away. This includes oil or grease spills. WHAT CAN I DO IN THE BATHROOM?   Use night lights.  Install grab bars by the toilet and in the tub and shower. Do not use towel bars as grab bars.  Use non-skid mats or decals in the tub or shower.  If you need to sit down in the shower, use a plastic, non-slip stool.  Keep the floor dry. Clean up any water that spills on the floor as soon as it happens.  Remove soap buildup in the tub or shower regularly.  Attach bath mats securely with double-sided non-slip rug tape.  Do not have throw rugs and other things on the floor that can make you trip. WHAT CAN I DO IN THE BEDROOM?  Use night lights.  Make sure that you have a light by your bed that is easy to reach.  Do not use any sheets or blankets that are too big for your bed. They should not hang down onto the floor.  Have a firm chair that has side arms. You can use this for support while you get dressed.  Do not have throw rugs and other things on the floor that can make you trip.  WHAT CAN I DO IN THE KITCHEN?  Clean up any spills right away.  Avoid walking on wet floors.  Keep items that you use a lot in easy-to-reach places.  If you need to reach something above you, use a strong step stool that has a grab bar.  Keep electrical cords out of the way.  Do not use floor polish or wax that makes floors slippery. If you must use wax, use non-skid floor wax.  Do not have throw rugs and other things on the floor that can make you trip. WHAT CAN I DO WITH MY STAIRS?  Do not leave any items on the stairs.  Make sure that there are handrails on both sides of the stairs and use them. Fix handrails that are broken or loose. Make sure that handrails are as long as the stairways.  Check any carpeting to make sure that it is firmly attached to the stairs. Fix any carpet that is loose or worn.  Avoid having throw rugs at the top or bottom of the stairs. If you do  have throw rugs, attach them to the floor with carpet tape.  Make sure that you have a light switch at the top of the stairs and the bottom of the stairs. If you do not have them, ask someone to add them for you. WHAT ELSE CAN I DO TO HELP PREVENT FALLS?  Wear shoes that:  Do not have high heels.  Have rubber bottoms.  Are comfortable and fit you well.  Are closed at the toe. Do not wear sandals.  If you use a stepladder:  Make sure that it is fully opened. Do not climb a closed stepladder.  Make sure that both sides of the stepladder are locked into place.  Ask someone to hold it for you, if possible.  Clearly mark and make sure that you can see:  Any grab bars or handrails.  First and last steps.  Where the edge of each step is.  Use tools that help you move around (mobility aids) if they are needed. These include:  Canes.  Walkers.  Scooters.  Crutches.  Turn on the lights when you go into a dark area. Replace any light bulbs as soon as they burn out.  Set up your furniture so you have a clear path. Avoid moving your furniture around.  If any of your floors are uneven, fix them.  If there are any pets around you, be aware of where they are.  Review your medicines with your doctor. Some medicines can make you feel dizzy. This can increase your chance of falling. Ask your doctor what other things that you can do to help prevent falls.   This information is not intended to replace advice given to you by your health care provider. Make sure you discuss any questions you have with your health care provider.   Document Released: 11/16/2008 Document Revised: 06/06/2014 Document Reviewed: 02/24/2014 Elsevier Interactive Patient Education [employer].

## 2015-12-05 NOTE — Progress Notes (Signed)
Subjective:   Alexander Barton is a 65 y.o. male who presents for an Initial Medicare Annual Wellness Visit.  Review of Systems  No ROS.  Medicare Wellness Visit.  Cardiac Risk Factors include: advanced age (>76men, >88 women);hypertension;male gender;diabetes mellitus    Objective:    Today's Vitals   12/05/15 1103  BP: 110/80  Pulse: 66  Resp: 14  SpO2: 95%  Weight: 225 lb 6.4 oz (102.2 kg)  Height: 6\' 1"  (1.854 m)   Body mass index is 29.74 kg/m.  Current Medications (verified) Outpatient Encounter Prescriptions as of 12/05/2015  Medication Sig  . Apoaequorin (PREVAGEN) 10 MG CAPS Take 10 mg by mouth. Reported on 08/17/2015  . aspirin 81 MG tablet Take 81 mg by mouth every evening.   . metoprolol succinate (TOPROL-XL) 50 MG 24 hr tablet Take 1 tablet (50 mg total) by mouth daily. Take with or immediately following a meal.  . rosuvastatin (CRESTOR) 20 MG tablet Take 1 tablet (20 mg total) by mouth daily.  . tamsulosin (FLOMAX) 0.4 MG CAPS capsule TAKE 2 CAPSULES (0.8 MG TOTAL) BY MOUTH DAILY.  Marland Kitchen warfarin (COUMADIN) 2 MG tablet TAKE 1 TABLET WITH 5 MG TABLET DAILY AS DIRECTED BY COUMADIN CLINIC  . warfarin (COUMADIN) 5 MG tablet TAKE 1-2 TABLETS BY MOUTH DAILY WITH 2 MG TABLET AS DIRECTED BY COUMADIN CLINIC  . [DISCONTINUED] Cinnamon 500 MG TABS Take 2,000 mg by mouth.   . meclizine (ANTIVERT) 25 MG tablet Take 0.5 tablets (12.5 mg total) by mouth 3 (three) times daily as needed for dizziness or nausea. (Patient not taking: Reported on 12/05/2015)  . [DISCONTINUED] benzonatate (TESSALON) 200 MG capsule Take 1 capsule (200 mg total) by mouth 2 (two) times daily as needed for cough. (Patient not taking: Reported on 08/17/2015)  . [DISCONTINUED] Calcium Carbonate-Vitamin D (TGT CALCIUM DIETARY SUPPLEMENT PO) Take 12.9 g by mouth. Reported on 08/17/2015  . [DISCONTINUED] Misc Natural Products (COLON CLEANSE PO) Take 550 mg by mouth. Reported on 08/17/2015  . [DISCONTINUED] Red  Yeast Rice 600 MG TABS Take 600 mg by mouth. Reported on 08/17/2015   No facility-administered encounter medications on file as of 12/05/2015.     Allergies (verified) Codeine; Hydrocodone; Statins; Hydromorphone; Ivp dye [iodinated diagnostic agents]; Lipitor [atorvastatin calcium]; Other; and Seroquel [quetiapine fumerate]   History: Past Medical History:  Diagnosis Date  . Aortic aneurysm, thoracic ([place])   . Aortic valve disease   . Arthritis   . BPH (benign prostatic hyperplasia)   . COPD (chronic obstructive pulmonary disease) ([place])   . GERD (gastroesophageal reflux disease)   . HTN (hypertension)   . Hypercholesterolemia   . Stroke ([place])   . TIA (transient ischemic attack)    Past Surgical History:  Procedure Laterality Date  . ACHILLES TENDON REPAIR    . AORTIC VALVE REPLACEMENT     #23 On-X valve conduit  . ASCENDING AORTIC ANEURYSM REPAIR    . BACK SURGERY    . FOOT SURGERY     Family History  Problem Relation Age of Onset  . Alzheimer's disease Father   . Diabetes Father     age onset DM  . Hypertension Sister   . Hypertension Brother   . Diabetes Mother   . Diabetes Sister   . Colon cancer Neg Hx    Social History   Occupational History  . SALES [employer]   Social History Main Topics  . Smoking status: Never Smoker  . Smokeless tobacco:  Never Used  . Alcohol use No  . Drug use: No  . Sexual activity: Not Currently   Tobacco Counseling Counseling given: Not Answered   Activities of Daily Living In your present state of health, do you have any difficulty performing the following activities: 12/05/2015  Hearing? N  Vision? N  Difficulty concentrating or making decisions? Y  Walking or climbing stairs? Y  Dressing or bathing? N  Doing errands, shopping? N  Preparing Food and eating ? N  Using the Toilet? N  In the past six months, have you accidently leaked urine? N  Do you have problems with loss of bowel control? N  Managing  your Medications? N  Managing your Finances? N  Housekeeping or managing your Housekeeping? N  Some recent data might be hidden    Immunizations and Health Maintenance Immunization History  Administered Date(s) Administered  . Influenza,inj,Quad PF,36+ Mos 11/14/2014  . Pneumococcal Conjugate-13 11/14/2014  . Pneumococcal Polysaccharide-23 12/05/2015   Health Maintenance Due  Topic Date Due  . Hepatitis C Screening  Dec 20, 1950  . FOOT EXAM  09/24/1958  . OPHTHALMOLOGY EXAM  09/24/1958  . URINE MICROALBUMIN  09/24/1958  . PNA vac Low Risk Adult (2 of 2 - PPSV23) 11/14/2015    Patient Care Team: Coral Spikes, DO as PCP - General (Family Medicine)  Indicate any recent Medical Services you may have received from other than [facility] providers in the past year (date may be approximate).    Assessment:   This is a routine wellness examination for Alexander Barton.  The goal of the wellness visit is to assist the patient how to close the gaps in care and create a preventative care plan for the patient.   Taking calcium VIT D as appropriate/Osteoporosis risk reviewed.  Medications reviewed; taking without issues or barriers.  Safety issues reviewed; smoke detectors in the home. No firearms in the home. Wears seatbelts when driving or riding with others. No violence in the home.  No identified risk were noted; The patient was oriented x 3; appropriate in dress and manner and no objective failures at ADL's or IADL's.   Body mass index; discussed the importance of a healthy diet, water intake and exercise. Educational material provided.  Pneumovax 23 vaccine administered, L deltoid. Tolerated well.    Patient Concerns: Intermittent R knee pain.  Follow up scheduled with PCP.  Hearing/Vision screen Hearing Screening Comments: Passes the whisper test Vision Screening Comments: Followed by [facility] (Dr. C) Last OV 04/2013 Wears glasses daily No retinopathy reported  Dietary issues and  exercise activities discussed: Current Exercise Habits: The patient does not participate in regular exercise at present  Goals    . Increase physical activity          Join the [facility] and start the [employer] program with his wife.      Depression Screen PHQ 2/9 Scores 12/05/2015  PHQ - 2 Score 0    Fall Risk Fall Risk  12/05/2015  Falls in the past year? No    Cognitive Function: MMSE - Mini Mental State Exam 12/05/2015  Orientation to time 5  Orientation to Place 5  Registration 3  Attention/ Calculation 5  Recall 3  Language- name 2 objects 2  Language- repeat 1  Language- follow 3 step command 3  Language- read & follow direction 1  Write a sentence 1  Copy design 1  Total score 30        Screening Tests Health Maintenance  Topic  Date Due  . Hepatitis C Screening  May 29, 1950  . FOOT EXAM  09/24/1958  . OPHTHALMOLOGY EXAM  09/24/1958  . URINE MICROALBUMIN  09/24/1958  . PNA vac Low Risk Adult (2 of 2 - PPSV23) 11/14/2015  . INFLUENZA VACCINE  05/02/2016 (Originally 09/04/2015)  . HEMOGLOBIN A1C  04/02/2016  . TETANUS/TDAP  02/03/2021  . COLONOSCOPY  10/11/2024  . ZOSTAVAX  Addressed        Plan:   End of life planning; Advance aging; Advanced directives discussed. No HCPOA/Living Will.  Additional material provided to help him start the conversation with his family.  Copy of short forms requested upon completion.  Time spent discussing this topic is 20 minutes.   Medicare Attestation I have personally reviewed: The patient's medical and social history Their use of alcohol, tobacco or illicit drugs Their current medications and supplements The patient's functional ability including ADLs,fall risks, home safety risks, cognitive, and hearing and visual impairment Diet and physical activities Evidence for depression   The patient's weight, height, BMI, and visual acuity have been recorded in the chart.  I have made referrals and provided education to  the patient based on review of the above and I have provided the patient with a written personalized care plan for preventive services.    During the course of the visit Dailyn was educated and counseled about the following appropriate screening and preventive services:   Vaccines to include Pneumoccal, Influenza, Hepatitis B, Td, Zostavax, HCV  Electrocardiogram  Colorectal cancer screening  Cardiovascular disease screening  Diabetes screening  Glaucoma screening  Nutrition counseling  Prostate cancer screening  Smoking cessation counseling  Patient Instructions (the written plan) were given to the patient.   Varney Biles, LPN   [id number]

## 2015-12-05 NOTE — Progress Notes (Signed)
Care was provided under my supervision. I agree with the management as indicated in the note.  Jadarion Halbig DO  

## 2015-12-09 ENCOUNTER — Other Ambulatory Visit: Payer: Self-pay | Admitting: Family Medicine

## 2015-12-12 NOTE — Progress Notes (Signed)
[facility] Date of Birth: Dec 20, 1950 Medical Record [id number]  History of Present Illness: [facility] is seen today for followup of valvular heart disease. He is status post mechanical aortic valve replacement in October 2008 with a #23 mm ON-X mechanical valve conduit with a button Bentall procedure and Hemi arch graft  and is on chronic Coumadin. He had a TIA in January 2012 and was admitted in May 2014 with a right thalamic CVA. He is now on aspirin and Coumadin.  [facility] included an [facility]ogram which showed normal valve function. Carotid Dopplers were OK.  On follow up today he reports feeling well. No chest pain. He does note some dyspnea on exertion especially walking up stairs. He attributes this to COPD and lack of exercise. He has been diagnosed with restless legs syndrome. In July he developed Salmonella and then C. Difficile while [place] [facility]. [facility] threw his coumadin dose off some but it is back to baseline. He was seen by [facility] at [facility] in September. Cardiac MRI was obtained and showed normal LV function. Moderate biatrial enlargement. Normal prosthetic valve function. No aneurysm.   Current Outpatient Prescriptions on File Prior to Visit  Medication Sig Dispense Refill  . Apoaequorin (PREVAGEN) 10 MG CAPS Take 10 mg by mouth. Reported on 08/17/2015    . aspirin 81 MG tablet Take 81 mg by mouth every evening.     . meclizine (ANTIVERT) 25 MG tablet Take 0.5 tablets (12.5 mg total) by mouth 3 (three) times daily as needed for dizziness or nausea. 24 tablet 0  . metoprolol succinate (TOPROL-XL) 50 MG 24 hr tablet TAKE 1 TABLET (50 MG TOTAL) BY MOUTH DAILY. TAKE WITH OR IMMEDIATELY FOLLOWING A MEAL. 90 tablet 1  . rosuvastatin (CRESTOR) 20 MG tablet Take 1 tablet (20 mg total) by mouth daily. 90 tablet 3  . tamsulosin (FLOMAX) 0.4 MG CAPS capsule TAKE 2 CAPSULES (0.8 MG TOTAL) BY MOUTH DAILY. 180 capsule 3  . warfarin (COUMADIN) 2 MG  tablet TAKE 1 TABLET WITH 5 MG TABLET DAILY AS DIRECTED BY COUMADIN CLINIC 90 tablet 1  . warfarin (COUMADIN) 5 MG tablet TAKE 1-2 TABLETS BY MOUTH DAILY WITH 2 MG TABLET AS DIRECTED BY COUMADIN CLINIC 120 tablet 1   No current facility-administered medications on file prior to visit.     Allergies  Allergen Reactions  . Codeine Anaphylaxis  . Hydrocodone Anaphylaxis  . Statins Other (See Comments)    Muscle spasm  . Hydromorphone Other (See Comments)    Pt says he has never taken  . Ivp Dye [Iodinated Diagnostic Agents] Other (See Comments)    Uncontrollable sneezing, needs sedative [facility] night before and hour and benadryl before using  . Lipitor [Atorvastatin Calcium] Other (See Comments)    Myalgias   . Other     Hydromorphone  . Seroquel [Quetiapine Fumerate] Other (See Comments)    "bad trip" tongue swell    Past Medical History:  Diagnosis Date  . Aortic aneurysm, thoracic ([place])   . Aortic valve disease   . Arthritis   . BPH (benign prostatic hyperplasia)   . COPD (chronic obstructive pulmonary disease) ([place])   . GERD (gastroesophageal reflux disease)   . HTN (hypertension)   . Hypercholesterolemia   . Stroke ([place])   . TIA (transient ischemic attack)     Past Surgical History:  Procedure Laterality Date  . ACHILLES TENDON REPAIR    . AORTIC VALVE REPLACEMENT     #  23 On-X valve conduit  . ASCENDING AORTIC ANEURYSM REPAIR    . BACK SURGERY    . FOOT SURGERY      History  Smoking Status  . Never Smoker  Smokeless Tobacco  . Never Used    History  Alcohol Use No    Family History  Problem Relation Age of Onset  . Alzheimer's disease Father   . Diabetes Father     age onset DM  . Hypertension Sister   . Hypertension Brother   . Diabetes Mother   . Diabetes Sister   . Colon cancer Neg Hx     Review of Systems: As noted in history of present illness.  All other systems were reviewed and are negative.  Physical Exam: BP 108/67   Pulse 69   Ht  6\' 2"  (1.88 m)   Wt 226 lb 9.6 oz (102.8 kg)   BMI 29.09 kg/m  He is a pleasant white male in no acute distress. HEENT exam is normal. Neck is supple no JVD, adenopathy, thyromegaly, or bruits. Lungs are clear. Cardiac exam reveals a good mechanical aortic valve click. His rhythm is regular. He has no murmur or rub. PMI is normal. Abdomen is soft nontender without masses or bruits. Extremities are without cyanosis or edema. Pulses are 2+ and symmetric. Skin is warm and dry. He is alert and oriented x3. Cranial nerves II through XII are intact. He has no focal motor or sensory deficits. LABORATORY DATA:  Lab Results  Component Value Date   WBC 4.8 10/03/2015   HGB 13.8 10/03/2015   HCT 39.7 10/03/2015   PLT 171.0 10/03/2015   GLUCOSE 86 10/03/2015   CHOL 174 10/03/2015   TRIG 195.0 (H) 10/03/2015   HDL 47.20 10/03/2015   LDLDIRECT 172.0 11/14/2014   LDLCALC 87 10/03/2015   ALT 23 10/03/2015   AST 22 10/03/2015   NA 136 10/03/2015   K 4.4 10/03/2015   CL 102 10/03/2015   CREATININE 1.36 10/03/2015   BUN 23 10/03/2015   CO2 29 10/03/2015   TSH 1.78 12/10/2010   PSA 0.34 11/14/2014   INR 3.1 11/30/2015   HGBA1C 6.2 10/03/2015     Assessment / Plan: 1. Status post mechanical aortic valve replacement. Continue anticoagulation with Coumadin. Echo in 2014 showed normal valve function. Recent cardiac MRI at [facility] was satisfactory. Valve exam is normal.  2. Hypertension-controlled 3. Status post CVA/right thalamic. Continue combined aspirin and Coumadin therapy. Continue to focus on blood pressure control and statin therapy. 4. Hypercholesterolemia. History of intolerance to lipitor. Tolerating Crestor well with LDL down to 87. Plan to increase crestor to 20 mg daily. Given history of recurrent stroke  goal LDL of  70.

## 2015-12-13 ENCOUNTER — Ambulatory Visit (INDEPENDENT_AMBULATORY_CARE_PROVIDER_SITE_OTHER): Payer: Medicare Other | Admitting: Cardiology

## 2015-12-13 ENCOUNTER — Encounter: Payer: Self-pay | Admitting: Cardiology

## 2015-12-13 VITALS — BP 108/67 | HR 69 | Ht 74.0 in | Wt 226.6 lb

## 2015-12-13 DIAGNOSIS — Z95828 Presence of other vascular implants and grafts: Secondary | ICD-10-CM

## 2015-12-13 DIAGNOSIS — I712 Thoracic aortic aneurysm, without rupture, unspecified: Secondary | ICD-10-CM

## 2015-12-13 DIAGNOSIS — E7800 Pure hypercholesterolemia, unspecified: Secondary | ICD-10-CM | POA: Diagnosis not present

## 2015-12-13 DIAGNOSIS — Z952 Presence of prosthetic heart valve: Secondary | ICD-10-CM

## 2015-12-13 DIAGNOSIS — I10 Essential (primary) hypertension: Secondary | ICD-10-CM

## 2015-12-13 DIAGNOSIS — I359 Nonrheumatic aortic valve disorder, unspecified: Secondary | ICD-10-CM | POA: Diagnosis not present

## 2015-12-13 NOTE — Patient Instructions (Signed)
Continue your current therapy  I agree with increase in Crestor to 20 mg daily  Increase your aerobic activity  I will see you in one year

## 2015-12-18 ENCOUNTER — Ambulatory Visit (INDEPENDENT_AMBULATORY_CARE_PROVIDER_SITE_OTHER): Payer: Medicare Other | Admitting: Family Medicine

## 2015-12-18 ENCOUNTER — Encounter: Payer: Self-pay | Admitting: Family Medicine

## 2015-12-18 ENCOUNTER — Ambulatory Visit (INDEPENDENT_AMBULATORY_CARE_PROVIDER_SITE_OTHER): Payer: Medicare Other

## 2015-12-18 VITALS — BP 122/80 | HR 64 | Temp 97.5°F | Resp 16 | Wt 231.0 lb

## 2015-12-18 DIAGNOSIS — M25561 Pain in right knee: Secondary | ICD-10-CM | POA: Diagnosis not present

## 2015-12-18 DIAGNOSIS — G8929 Other chronic pain: Secondary | ICD-10-CM

## 2015-12-18 DIAGNOSIS — L989 Disorder of the skin and subcutaneous tissue, unspecified: Secondary | ICD-10-CM | POA: Insufficient documentation

## 2015-12-18 NOTE — Patient Instructions (Signed)
Tylenol as needed.  We will call with the xray results and with the referral.  Take care  Dr. Lacinda Axon

## 2015-12-18 NOTE — Progress Notes (Signed)
Subjective:  Patient ID: [facility], male    DOB: 1950/04/11  Age: 65 y.o. MRN: [id number]  CC: R knee pain, Blisters on feet  HPI:  65 year old male with a complicated PMH including HTN, HLD, DM-2, Aortic valve replacement presents with the above complaints  R knee pain  He reports R knee pain for the past 3 months.  Moderate in severity.  Pain is anterior.  Associated inability to fully extend knee.  He also reports intermittent swelling.  Improves with activity.  Worsening by prolonged sitting.  Blisters on feet  Patient reports a frequent occurrence of "blisters" on his feet.  Do not appear to be brought on by pressure. No reported injuries.  Drain clear fluid and then "dry up".  Associated hyperpigmentation.  Social Hx   Social History   Social History  . Marital status: Married    Spouse name: N/A  . Number of children: 2  . Years of education: N/A   Occupational History  . SALES [employer]   Social History Main Topics  . Smoking status: Never Smoker  . Smokeless tobacco: Never Used  . Alcohol use No  . Drug use: No  . Sexual activity: Not Currently   Other Topics Concern  . 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   One son and one daughter   He lives in [place]   One caffeinated beverage daily   08/30/2014       Review of Systems  Musculoskeletal:       Right knee pain.  Skin:       Skin lesions on feet.   Objective:  BP 122/80 (BP Location: Right Arm, Patient Position: Sitting, Cuff Size: Normal)   Pulse 64   Temp 97.5 F (36.4 C) (Oral)   Resp 16   Wt 231 lb (104.8 kg)   SpO2 94%   BMI 29.66 kg/m   BP/Weight 12/18/2015 12/13/2015 [id number]  Systolic BP 123XX123 123XX123 [id number]  Diastolic BP 80 67 80  Wt. (Lbs) 231 226.6 225.4  BMI 29.66 29.09 29.74    Physical Exam  Constitutional: He appears well-developed. No distress.    Pulmonary/Chest: Effort normal.  Musculoskeletal:  Knee: Right  Normal to inspection with no erythema or effusion or obvious bony abnormalities.  Palpation with lateral joint line tenderness.  ROM - lacks full extension. Ligaments with solid consistent endpoints including ACL, PCL, LCL, MCL. Painful patellar compression   Skin:  Erythematous lesions noted on the plantar aspect of the left foot. Clear drainage noted.   Psychiatric: He has a normal mood and affect.  Vitals reviewed.   Lab Results  Component Value Date   WBC 4.8 10/03/2015   HGB 13.8 10/03/2015   HCT 39.7 10/03/2015   PLT 171.0 10/03/2015   GLUCOSE 86 10/03/2015   CHOL 174 10/03/2015   TRIG 195.0 (H) 10/03/2015   HDL 47.20 10/03/2015   LDLDIRECT 172.0 11/14/2014   LDLCALC 87 10/03/2015   ALT 23 10/03/2015   AST 22 10/03/2015   NA 136 10/03/2015   K 4.4 10/03/2015   CL 102 10/03/2015   CREATININE 1.36 10/03/2015   BUN 23 10/03/2015   CO2 29 10/03/2015   TSH 1.78 12/10/2010   PSA 0.34 11/14/2014   INR 3.1 11/30/2015   HGBA1C 6.2 10/03/2015    Assessment & Plan:   Problem List Items Addressed  This Visit    Skin lesions    New problem. Patient with skin lesions on feet. Unclear etiology/prognosis. Sending to derm.      Relevant Orders   Ambulatory referral to Dermatology   Chronic pain of right knee - Primary    New problem. Suspect underlying degenerative process. Xray today. PRN Tylenol  No NSAIDS in setting of chronic anticoagulation.       Relevant Orders   DG Knee 3 Views Right (Completed)     Follow-up: Pending xray results.   [place]

## 2015-12-18 NOTE — Progress Notes (Signed)
Pre visit review using our clinic review tool, if applicable. No additional management support is needed unless otherwise documented below in the visit note. 

## 2015-12-18 NOTE — Assessment & Plan Note (Signed)
New problem. Patient with skin lesions on feet. Unclear etiology/prognosis. Sending to derm.

## 2015-12-18 NOTE — Assessment & Plan Note (Signed)
New problem. Suspect underlying degenerative process. Xray today. PRN Tylenol  No NSAIDS in setting of chronic anticoagulation.

## 2015-12-19 ENCOUNTER — Ambulatory Visit (INDEPENDENT_AMBULATORY_CARE_PROVIDER_SITE_OTHER): Payer: Medicare Other | Admitting: Pharmacist

## 2015-12-19 DIAGNOSIS — Z7901 Long term (current) use of anticoagulants: Secondary | ICD-10-CM

## 2015-12-19 DIAGNOSIS — I359 Nonrheumatic aortic valve disorder, unspecified: Secondary | ICD-10-CM

## 2015-12-19 LAB — POCT INR: INR: 3

## 2015-12-24 ENCOUNTER — Encounter: Payer: Self-pay | Admitting: Family Medicine

## 2016-01-10 ENCOUNTER — Ambulatory Visit (INDEPENDENT_AMBULATORY_CARE_PROVIDER_SITE_OTHER): Payer: Medicare Other | Admitting: Pharmacist Clinician (PhC)/ Clinical Pharmacy Specialist

## 2016-01-10 ENCOUNTER — Encounter: Payer: Self-pay | Admitting: Cardiology

## 2016-01-10 DIAGNOSIS — I359 Nonrheumatic aortic valve disorder, unspecified: Secondary | ICD-10-CM

## 2016-01-10 DIAGNOSIS — Z7901 Long term (current) use of anticoagulants: Secondary | ICD-10-CM

## 2016-01-10 LAB — POCT INR: INR: 2.8

## 2016-01-14 ENCOUNTER — Other Ambulatory Visit: Payer: Self-pay | Admitting: Cardiology

## 2016-02-02 LAB — PROTIME-INR

## 2016-02-03 LAB — POCT INR: INR: 3.4

## 2016-02-03 LAB — PROTIME-INR

## 2016-02-07 ENCOUNTER — Ambulatory Visit (INDEPENDENT_AMBULATORY_CARE_PROVIDER_SITE_OTHER): Payer: Medicare Other | Admitting: Pharmacist Clinician (PhC)/ Clinical Pharmacy Specialist

## 2016-02-07 DIAGNOSIS — Z7901 Long term (current) use of anticoagulants: Secondary | ICD-10-CM

## 2016-02-07 DIAGNOSIS — I359 Nonrheumatic aortic valve disorder, unspecified: Secondary | ICD-10-CM

## 2016-02-26 LAB — PROTIME-INR: INR: 2.9 — AB (ref 0.9–1.1)

## 2016-02-28 ENCOUNTER — Ambulatory Visit (INDEPENDENT_AMBULATORY_CARE_PROVIDER_SITE_OTHER): Payer: Medicare Other | Admitting: Pharmacist

## 2016-02-28 DIAGNOSIS — Z7901 Long term (current) use of anticoagulants: Secondary | ICD-10-CM

## 2016-02-28 DIAGNOSIS — I359 Nonrheumatic aortic valve disorder, unspecified: Secondary | ICD-10-CM

## 2016-03-21 ENCOUNTER — Ambulatory Visit (INDEPENDENT_AMBULATORY_CARE_PROVIDER_SITE_OTHER): Payer: Medicare Other | Admitting: Pharmacist Clinician (PhC)/ Clinical Pharmacy Specialist

## 2016-03-21 DIAGNOSIS — Z7901 Long term (current) use of anticoagulants: Secondary | ICD-10-CM

## 2016-03-21 DIAGNOSIS — I359 Nonrheumatic aortic valve disorder, unspecified: Secondary | ICD-10-CM

## 2016-03-21 LAB — POCT INR: INR: 3.4

## 2016-04-14 ENCOUNTER — Other Ambulatory Visit: Payer: Self-pay | Admitting: Cardiology

## 2016-04-17 ENCOUNTER — Ambulatory Visit (INDEPENDENT_AMBULATORY_CARE_PROVIDER_SITE_OTHER): Payer: Medicare Other | Admitting: Pharmacist

## 2016-04-17 DIAGNOSIS — I359 Nonrheumatic aortic valve disorder, unspecified: Secondary | ICD-10-CM

## 2016-04-17 DIAGNOSIS — Z7901 Long term (current) use of anticoagulants: Secondary | ICD-10-CM

## 2016-04-17 LAB — PROTIME-INR: INR: 2.6 — AB (ref 0.9–1.1)

## 2016-05-14 LAB — POCT INR: INR: 2.5

## 2016-05-15 ENCOUNTER — Ambulatory Visit (INDEPENDENT_AMBULATORY_CARE_PROVIDER_SITE_OTHER): Payer: Self-pay | Admitting: Pharmacist Clinician (PhC)/ Clinical Pharmacy Specialist

## 2016-05-15 DIAGNOSIS — I359 Nonrheumatic aortic valve disorder, unspecified: Secondary | ICD-10-CM

## 2016-05-15 DIAGNOSIS — Z7901 Long term (current) use of anticoagulants: Secondary | ICD-10-CM

## 2016-06-04 ENCOUNTER — Other Ambulatory Visit: Payer: Self-pay | Admitting: Family Medicine

## 2016-06-23 ENCOUNTER — Emergency Department: Payer: Medicare Other

## 2016-06-23 ENCOUNTER — Telehealth: Payer: Self-pay | Admitting: *Deleted

## 2016-06-23 ENCOUNTER — Telehealth: Payer: Self-pay | Admitting: Family Medicine

## 2016-06-23 ENCOUNTER — Observation Stay
Admission: EM | Admit: 2016-06-23 | Discharge: 2016-06-25 | Disposition: A | Discharge status: Home / Self Care | Payer: Medicare Other | Attending: Internal Medicine | Admitting: Internal Medicine

## 2016-06-23 ENCOUNTER — Telehealth: Payer: Self-pay | Admitting: Cardiology

## 2016-06-23 DIAGNOSIS — Z8673 Personal history of transient ischemic attack (TIA), and cerebral infarction without residual deficits: Secondary | ICD-10-CM | POA: Diagnosis not present

## 2016-06-23 DIAGNOSIS — I10 Essential (primary) hypertension: Secondary | ICD-10-CM | POA: Diagnosis present

## 2016-06-23 DIAGNOSIS — E119 Type 2 diabetes mellitus without complications: Secondary | ICD-10-CM | POA: Insufficient documentation

## 2016-06-23 DIAGNOSIS — E785 Hyperlipidemia, unspecified: Secondary | ICD-10-CM | POA: Diagnosis present

## 2016-06-23 DIAGNOSIS — R0609 Other forms of dyspnea: Secondary | ICD-10-CM | POA: Diagnosis not present

## 2016-06-23 DIAGNOSIS — I081 Rheumatic disorders of both mitral and tricuspid valves: Secondary | ICD-10-CM | POA: Insufficient documentation

## 2016-06-23 DIAGNOSIS — Z952 Presence of prosthetic heart valve: Secondary | ICD-10-CM | POA: Diagnosis not present

## 2016-06-23 DIAGNOSIS — Z7982 Long term (current) use of aspirin: Secondary | ICD-10-CM | POA: Diagnosis not present

## 2016-06-23 DIAGNOSIS — Z7901 Long term (current) use of anticoagulants: Secondary | ICD-10-CM | POA: Insufficient documentation

## 2016-06-23 DIAGNOSIS — E7800 Pure hypercholesterolemia, unspecified: Secondary | ICD-10-CM | POA: Insufficient documentation

## 2016-06-23 DIAGNOSIS — R5381 Other malaise: Secondary | ICD-10-CM

## 2016-06-23 DIAGNOSIS — J449 Chronic obstructive pulmonary disease, unspecified: Secondary | ICD-10-CM | POA: Diagnosis not present

## 2016-06-23 DIAGNOSIS — Z79899 Other long term (current) drug therapy: Secondary | ICD-10-CM | POA: Diagnosis not present

## 2016-06-23 DIAGNOSIS — R0602 Shortness of breath: Secondary | ICD-10-CM | POA: Diagnosis present

## 2016-06-23 DIAGNOSIS — N400 Enlarged prostate without lower urinary tract symptoms: Secondary | ICD-10-CM | POA: Insufficient documentation

## 2016-06-23 DIAGNOSIS — R0600 Dyspnea, unspecified: Secondary | ICD-10-CM

## 2016-06-23 LAB — PROTIME-INR
INR: 2.08
Prothrombin Time: 23.7 seconds — ABNORMAL HIGH (ref 11.4–15.2)

## 2016-06-23 LAB — BASIC METABOLIC PANEL
Anion gap: 9 (ref 5–15)
BUN: 25 mg/dL — ABNORMAL HIGH (ref 6–20)
CO2: 22 mmol/L (ref 22–32)
Calcium: 9.3 mg/dL (ref 8.9–10.3)
Chloride: 105 mmol/L (ref 101–111)
Creatinine, Ser: 1.02 mg/dL (ref 0.61–1.24)
GFR calc Af Amer: >60 mL/min (ref >60)
GFR calc non Af Amer: >60 mL/min (ref >60)
Glucose, Bld: 112 mg/dL — ABNORMAL HIGH (ref 65–99)
Potassium: 4.1 mmol/L (ref 3.5–5.1)
Sodium: 136 mmol/L (ref 135–145)

## 2016-06-23 LAB — CBC
HCT: 37.8 % — ABNORMAL LOW (ref 40.0–52.0)
Hemoglobin: 13.1 g/dL (ref 13.0–18.0)
MCH: 31.4 pg (ref 26.0–34.0)
MCHC: 34.8 g/dL (ref 32.0–36.0)
MCV: 90.3 fL (ref 80.0–100.0)
Platelets: 192 10*3/uL (ref 150–440)
RBC: 4.19 MIL/uL — ABNORMAL LOW (ref 4.40–5.90)
RDW: 14 % (ref 11.5–14.5)
WBC: 6.2 10*3/uL (ref 3.8–10.6)

## 2016-06-23 LAB — BRAIN NATRIURETIC PEPTIDE: B Natriuretic Peptide: 192 pg/mL — ABNORMAL HIGH (ref 0.0–100.0)

## 2016-06-23 LAB — TROPONIN I: Troponin I: <0.03 ng/mL (ref <0.03)

## 2016-06-23 MED ORDER — DIPHENHYDRAMINE HCL 50 MG/ML IJ SOLN
50.0000 mg | Freq: Once | INTRAMUSCULAR | Status: AC
  Administered 2016-06-23: 50 mg via INTRAVENOUS
  Filled 2016-06-23: qty 1

## 2016-06-23 MED ORDER — SODIUM CHLORIDE 0.9 % IV SOLN
200.0000 mg | Freq: Once | INTRAVENOUS | Status: AC
  Administered 2016-06-23: 200 mg via INTRAVENOUS
  Filled 2016-06-23: qty 1.6

## 2016-06-23 NOTE — ED Triage Notes (Signed)
Pt presents to ED c/o SOB x3-4 months since starting oral terbinafine. Pt also has COPD; presents without audible wheezing. Called PCP and was told that it was best to come here for a workup.

## 2016-06-23 NOTE — ED Notes (Signed)
Spoke with [facility] in [place] states CTA after 0215 d/t meds prior to scan for allergy

## 2016-06-23 NOTE — Telephone Encounter (Signed)
Spoke with patient he just arrived at [facility]

## 2016-06-23 NOTE — ED Notes (Signed)
Report to Yasmin, Therapist, sports.

## 2016-06-23 NOTE — Telephone Encounter (Signed)
[facility]  Patient Name: [facility]  DOB: 11/26/1950    Initial Comment Caller is having shortness of breath and has history of copd.    Nurse Assessment  Nurse: Arthor Captain, RN, Margaret Date/Time (Eastern Time): 06/23/2016 3:26:10 PM  Confirm and document reason for call. If symptomatic, describe symptoms. ---Caller states that he is having SOB and has a hx of COPD. SOB got worse when he is walking, even as short distances of 20 steps. Caller states that he isnt short of breath at present he is sitting at his desk at work. Also noticed that he has puffiness in the diaphragm area. Has had a aneurism repair of his ascending aorta. Now has ulcers in that area.  Does the patient have any new or worsening symptoms? ---Yes  Will a triage be completed? ---Yes  Related visit to physician within the last 2 weeks? ---No  Does the PT have any chronic conditions? (i.e. diabetes, asthma, etc.) ---Yes  List chronic conditions. ---aneurism repair of acending aorta, ulcers in that area now, COPD  Is this a behavioral health or substance abuse call? ---No     Guidelines    Guideline Title Affirmed Question Affirmed Notes  Breathing Difficulty History of prior "blood clot" in leg or lungs (i.e., deep vein thrombosis, pulmonary embolism)    Final Disposition User   Go to ED Now Cockrum, RN, [facility] - ED   Disagree/Comply: Comply

## 2016-06-23 NOTE — ED Provider Notes (Signed)
[facility] Emergency Department Provider Note  ____________________________________________   I have reviewed the triage vital signs and the nursing notes.   HISTORY  Chief Complaint Shortness of Breath and COPD    HPI [facility] is a 66 y.o. male  with a history of aneurysmal repair of his ascending aorta, as well as mechanical valve replacement remotely, has had CVAs since that time, on Coumadin, is always therapeutic on his Coumadin, states that he also has COPD. Patient states he has had increasing dyspnea on exertion for the last month getting to the point where now he cannot walk across flat surfaces without getting winded. This is a new finding for him. Initially, it was only when he went up and down stairs or exercise but now, even mild exertion in the room would make him "half and puff". He denies any chest pain, he has no shortness of breath at this moment is lying to sitting still. He denies any leg pain or swelling. He states he did have a blood clot in his legs after an accident many years ago. He denies fever or chills. He is not wheezing. He states he has an occasional cough.      Past Medical History:  Diagnosis Date  . Aortic aneurysm, thoracic ([place])   . Aortic valve disease   . Arthritis   . BPH (benign prostatic hyperplasia)   . COPD (chronic obstructive pulmonary disease) ([place])   . GERD (gastroesophageal reflux disease)   . HTN (hypertension)   . Hypercholesterolemia   . Stroke ([place])   . TIA (transient ischemic attack)     Patient Active Problem List   Diagnosis Date Noted  . Chronic pain of right knee 12/18/2015  . Skin lesions 12/18/2015  . Right shoulder pain 10/04/2015  . Chronic obstructive pulmonary disease ([place]) 11/14/2014  . BPH (benign prostatic hyperplasia) 11/14/2014  . Preventative health care 11/14/2014  . Long term current use of anticoagulant therapy 10/29/2013  . Type 2 diabetes mellitus ([place]) 06/02/2013   . Cerebral infarction ([place]) 06/02/2013  . H/O aortic valve replacement 12/26/2011  . History of aortic arch replacement 01/07/2011  . HTN (hypertension)   . Hyperlipidemia   . Aortic aneurysm, thoracic ([place])   . Aortic valve disease     Past Surgical History:  Procedure Laterality Date  . ACHILLES TENDON REPAIR    . AORTIC VALVE REPLACEMENT     #23 On-X valve conduit  . ASCENDING AORTIC ANEURYSM REPAIR    . BACK SURGERY    . FOOT SURGERY      Prior to Admission medications   Medication Sig Start Date End Date Taking? Authorizing Provider  Apoaequorin (PREVAGEN) 10 MG CAPS Take 10 mg by mouth. Reported on 08/17/2015    [provider]  aspirin 81 MG tablet Take 81 mg by mouth every evening.     [provider]  meclizine (ANTIVERT) 25 MG tablet Take 0.5 tablets (12.5 mg total) by mouth 3 (three) times daily as needed for dizziness or nausea. 05/25/15   Daymon Larsen, MD  metoprolol succinate (TOPROL-XL) 50 MG 24 hr tablet TAKE 1 TABLET (50 MG TOTAL) BY MOUTH DAILY. TAKE WITH OR IMMEDIATELY FOLLOWING A MEAL. 06/04/16   Thersa Salt G, DO  rosuvastatin (CRESTOR) 20 MG tablet Take 1 tablet (20 mg total) by mouth daily. 11/05/15   Coral Spikes, DO  tamsulosin (FLOMAX) 0.4 MG CAPS capsule TAKE 2 CAPSULES (0.8 MG TOTAL) BY MOUTH DAILY. 11/05/15  Cook, [place] G, DO  warfarin (COUMADIN) 2 MG tablet TAKE 1 TABLET WITH 5 MG TABLET DAILY AS DIRECTED BY COUMADIN CLINIC 04/14/16   [name], [name] M, MD  warfarin (COUMADIN) 5 MG tablet TAKE 1-2 TABLETS BY MOUTH DAILY WITH 2 MG TABLET AS DIRECTED BY COUMADIN CLINIC 01/14/16   [name], [name] M, MD    Allergies Codeine; Hydrocodone; Statins; Hydromorphone; Ivp dye [iodinated diagnostic agents]; Lipitor [atorvastatin calcium]; Other; and Seroquel [quetiapine fumerate]  Family History  Problem Relation Age of Onset  . Alzheimer's disease Father   . Diabetes Father        age onset DM  . Hypertension Sister   . Hypertension Brother    . Diabetes Mother   . Diabetes Sister   . Colon cancer Neg Hx     Social History Social History  Substance Use Topics  . Smoking status: Never Smoker  . Smokeless tobacco: Never Used  . Alcohol use No    Review of Systems Constitutional: No fever/chills Eyes: No visual changes. ENT: No sore throat. No stiff neck no neck pain Cardiovascular: Denies chest pain. Respiratory: Positive shortness of breath. Gastrointestinal:   no vomiting.  No diarrhea.  No constipation. Genitourinary: Negative for dysuria. Musculoskeletal: Negative lower extremity swelling Skin: Negative for rash. Neurological: Negative for severe headaches, focal weakness or numbness. 10-point ROS otherwise negative.  ____________________________________________   PHYSICAL EXAM:  VITAL SIGNS: ED Triage Vitals  Enc Vitals Group     BP 06/23/16 1642 112/71     Pulse Rate 06/23/16 1642 (!) 55     Resp 06/23/16 1642 (!) 22     Temp 06/23/16 1642 98 F (36.7 C)     Temp Source 06/23/16 1642 Oral     SpO2 06/23/16 1642 96 %     Weight 06/23/16 1643 220 lb (99.8 kg)     Height 06/23/16 1643 6\' 2"  (1.88 m)     Head Circumference --      Peak Flow --      Pain Score --      Pain Loc --      Pain Edu? --      Excl. in [place]? --     Constitutional: Alert and oriented. Well appearing and in no acute distress. Eyes: Conjunctivae are normal. PERRL. EOMI. Head: Atraumatic. Nose: No congestion/rhinnorhea. Mouth/Throat: Mucous membranes are moist.  Oropharynx non-erythematous. Neck: No stridor.   Nontender with no meningismus Cardiovascular: Normal rate, regular rhythm. Mechanical click appreciated  Good peripheral circulation. Respiratory: Normal respiratory effort.  No retractions. Lungs CTAB. Abdominal: Soft and nontender. No distention. No guarding no rebound Back:  There is no focal tenderness or step off.  there is no midline tenderness there are no lesions noted. there is no CVA  tenderness Musculoskeletal: No lower extremity tenderness, no upper extremity tenderness. No joint effusions, no DVT signs strong distal pulses no edema Neurologic:  Normal speech and language. No gross focal neurologic deficits are appreciated.  Skin:  Skin is warm, dry and intact. No rash noted. Psychiatric: Mood and affect are normal. Speech and behavior are normal.  ____________________________________________   LABS (all labs ordered are listed, but only abnormal results are displayed)  Labs Reviewed  BASIC METABOLIC PANEL - Abnormal; Notable for the following:       Result Value   Glucose, Bld 112 (*)    BUN 25 (*)    All other components within normal limits  CBC - Abnormal; Notable for the following:  RBC 4.19 (*)    HCT 37.8 (*)    All other components within normal limits  TROPONIN I  BRAIN NATRIURETIC PEPTIDE  PROTIME-INR   ____________________________________________  EKG  I personally interpreted any EKGs ordered by me or triage Sinus retarded rate 54 bpm no acute ST elevation or acute ST depression normal axis unremarkable EKG ____________________________________________  RADIOLOGY  I reviewed any imaging ordered by me or triage that were performed during my shift and, if possible, patient and/or family made aware of any abnormal findings. ____________________________________________   PROCEDURES  Procedure(s) performed: None  Procedures  Critical Care performed: None  ____________________________________________   INITIAL IMPRESSION / ASSESSMENT AND PLAN / ED COURSE  Pertinent labs & imaging results that were available during my care of the patient were reviewed by me and considered in my medical decision making (see chart for details).  With increasing dyspnea on exertion, I do feel PE is a possibility, we'll check his INR, patient has had CVAs and history of blood clots in his legs, chest x-ray is unremarkable, no evidence of acute ischemia,  we are sending BNP as well as INR. Patient I believe will require a CT scan of his chest. However, he has a reaction to IV contrast. He has a reaction which is "sneezing". He has had multiple different IV contrast CT scans but he has received Benadryl first. Otherwise he sneezes. No other anaphylactic reactions. After discussing with radiology department, they would feel more comfortable with IV steroids and Benadryl prior to contrasted menstruation, they would like for her weight. We will do that. Patient has had symptoms for some time now. In the meantime I think patient should be admitted to the hospital for progressive dyspnea on exertion, he will require cardiac workup for this.     ____________________________________________   FINAL CLINICAL IMPRESSION(S) / ED DIAGNOSES  Final diagnoses:  SOB (shortness of breath)      This chart was dictated using voice recognition software.  Despite best efforts to proofread,  errors can occur which can change meaning.      Schuyler Amor, MD 06/23/16 2145

## 2016-06-23 NOTE — Telephone Encounter (Signed)
New message     Pt c/o Shortness Of Breath: STAT if SOB developed within the last 24 hours or pt is noticeably SOB on the phone  1. Are you currently SOB (can you hear that pt is SOB on the phone)? No.   2. How long have you been experiencing SOB? A few months-pt states it's only getting worse. He said it doesn't really take anything for him to become SOB.   3. Are you SOB when sitting or when up moving around? Up moving around.  4. Are you currently experiencing any other symptoms? No

## 2016-06-23 NOTE — Telephone Encounter (Signed)
Patient took Spiriva in the past and stopped this medication a few years ago and started using essential oils and a diffuser this helped, however, he feels he needs help with his breathing after some weight gain over the winter. Patient has hx of COPD. Pt stated that he has fungus called bullous tinea, and he's taking terbinafine HCL, this medication causes him to feel bloated, he has concern that this medication could be causing him to have breathing issues. He becomes short of breath after a few steps and when laying. No other symptoms to report  *Pt transferred to team health for SOB

## 2016-06-23 NOTE — ED Notes (Signed)
MD Jannifer [place] at bedside at this time.

## 2016-06-23 NOTE — H&P (Signed)
Alexander Barton at [place] NAME: Alexander Barton    MR#:  [id number]  DATE OF BIRTH:  July 06, 1950  DATE OF ADMISSION:  06/23/2016  PRIMARY CARE PHYSICIAN: Alexander Spikes, DO   REQUESTING/REFERRING PHYSICIAN: Burlene Arnt, MD  CHIEF COMPLAINT:   Chief Complaint  Patient presents with  . Shortness of Breath  . COPD    HISTORY OF PRESENT ILLNESS:  Alexander Barton  is a 66 y.o. male who presents with For aggressive dyspnea on exertion. Patient states that for the past several months this is been getting worse, but over the past several days to a couple of weeks he was gotten significantly much worse. He states that initially he started getting winded going up steps or on inclines, but that over the immediate past he's been getting winded walking on level surfaces for more than just 10 steps. He has a history of aortic aneurysm repair as well as aortic valve replacement with mechanical valve. Historically his INRs have always been therapeutic, but INR tonight in the ED is just a little bit subtherapeutic for mechanical valve level. Given his persistent symptoms, hospitalists were called for admission and further evaluation  PAST MEDICAL HISTORY:   Past Medical History:  Diagnosis Date  . Aortic aneurysm, thoracic ([place])   . Aortic valve disease   . Arthritis   . BPH (benign prostatic hyperplasia)   . COPD (chronic obstructive pulmonary disease) ([place])   . GERD (gastroesophageal reflux disease)   . HTN (hypertension)   . Hypercholesterolemia   . Stroke ([place])   . TIA (transient ischemic attack)     PAST SURGICAL HISTORY:   Past Surgical History:  Procedure Laterality Date  . ACHILLES TENDON REPAIR    . AORTIC VALVE REPLACEMENT     #23 On-X valve conduit  . ASCENDING AORTIC ANEURYSM REPAIR    . BACK SURGERY    . FOOT SURGERY      SOCIAL HISTORY:   Social History  Substance Use Topics  . Smoking status: Never Smoker  . Smokeless  tobacco: Never Used  . Alcohol use No    FAMILY HISTORY:   Family History  Problem Relation Age of Onset  . Alzheimer's disease Father   . Diabetes Father        age onset DM  . Hypertension Sister   . Hypertension Brother   . Diabetes Mother   . Diabetes Sister   . Colon cancer Neg Hx     DRUG ALLERGIES:   Allergies  Allergen Reactions  . Codeine Anaphylaxis  . Hydrocodone Anaphylaxis  . Statins Other (See Comments)    Muscle spasm  . Hydromorphone Other (See Comments)    Pt says he has never taken  . Ivp Dye [Iodinated Diagnostic Agents] Other (See Comments)    Uncontrollable sneezing, needs sedative the night before and hour and benadryl before using  . Lipitor [Atorvastatin Calcium] Other (See Comments)    Myalgias   . Other     Hydromorphone  . Seroquel [Quetiapine Fumerate] Other (See Comments)    "bad trip" tongue swell    MEDICATIONS AT HOME:   Prior to Admission medications   Medication Sig Start Date End Date Taking? Authorizing Provider  acidophilus (RISAQUAD) CAPS capsule Take 2 capsules by mouth daily.   Yes [provider]  Apoaequorin (PREVAGEN) 10 MG CAPS Take 10 mg by mouth. Reported on 08/17/2015   Yes [provider]  aspirin 81 MG tablet Take  81 mg by mouth at bedtime.    Yes [provider]  cholecalciferol (VITAMIN D) 1000 units tablet Take 1,000 Units by mouth daily.   Yes [provider]  metoprolol succinate (TOPROL-XL) 50 MG 24 hr tablet TAKE 1 TABLET (50 MG TOTAL) BY MOUTH DAILY. TAKE WITH OR IMMEDIATELY FOLLOWING A MEAL. 06/04/16  Yes Alexander Barton, Alexander G, DO  Multiple Vitamin (MULTIVITAMIN WITH MINERALS) TABS tablet Take 1 tablet by mouth 2 (two) times daily.   Yes [provider]  Nutritional Supplements (VITAMIN D PLUS COFACTORS PO) Take 1 capsule by mouth every evening.   Yes [provider]  omega-3 acid ethyl esters (LOVAZA) 1 Barton capsule Take 1 Barton by mouth every evening.   Yes [provider]  SPIRULINA PO Take 3 capsules by mouth daily.   Yes [provider]  tamsulosin (FLOMAX) 0.4 MG CAPS capsule TAKE 2 CAPSULES (0.8 MG TOTAL) BY MOUTH DAILY. Patient taking differently: Take 0.8 mg by mouth at bedtime.  11/05/15  Yes Alexander Barton, Alexander G, DO  terbinafine (LAMISIL) 250 MG tablet Take 250 mg by mouth at bedtime.   Yes [provider]  warfarin (COUMADIN) 2 MG tablet Take 2 mg by mouth as directed. Take with 5 mg tab on Tuesday, Thursday, Saturday and Sunday for a total of 7 mg.   Yes [provider]  warfarin (COUMADIN) 5 MG tablet Take 5-10 mg by mouth at bedtime. 10mg  on Monday, Wednesday, Friday. Take with 2mg  tab for a total 7mg  on all other days.   Yes [provider]    REVIEW OF SYSTEMS:  Review of Systems  Constitutional: Negative for chills, fever, malaise/fatigue and weight loss.  HENT: Negative for ear pain, hearing loss and tinnitus.   Eyes: Negative for blurred vision, double vision, pain and redness.  Respiratory: Positive for shortness of breath. Negative for cough and hemoptysis.   Cardiovascular: Positive for orthopnea. Negative for chest pain, palpitations and leg swelling.  Gastrointestinal: Negative for abdominal pain, constipation, diarrhea, nausea and vomiting.  Genitourinary: Negative for dysuria, frequency and hematuria.  Musculoskeletal: Negative for back pain, joint pain and neck pain.  Skin:       No acne, rash, or lesions  Neurological: Negative for dizziness, tremors, focal weakness and weakness.  Endo/Heme/Allergies: Negative for polydipsia. Does not bruise/bleed easily.  Psychiatric/Behavioral: Negative for depression. The patient is not nervous/anxious and does not have insomnia.      VITAL SIGNS:   Vitals:   06/23/16 1642 06/23/16 1643 06/23/16 2050 06/23/16 2054  BP: 112/71  126/77   Pulse: (!) 55  (!) 52 (!) 52  Resp: (!) 22   14  Temp: 98 F (36.7 C)     TempSrc: Oral     SpO2: 96%  96% 99%   Weight:  99.8 kg (220 lb)    Height:  6\' 2"  (1.88 m)     Wt Readings from Last 3 Encounters:  06/23/16 99.8 kg (220 lb)  12/18/15 104.8 kg (231 lb)  12/13/15 102.8 kg (226 lb 9.6 oz)    PHYSICAL EXAMINATION:  Physical Exam  Vitals reviewed. Constitutional: He is oriented to person, place, and time. He appears well-developed and well-nourished. No distress.  HENT:  Head: Normocephalic and atraumatic.  Mouth/Throat: Oropharynx is clear and moist.  Eyes: Conjunctivae and EOM are normal. Pupils are equal, round, and reactive to light. No scleral icterus.  Neck: Normal range of motion. Neck supple. No JVD present. No thyromegaly present.  Cardiovascular: Normal rate, regular rhythm and intact distal pulses.  Exam reveals no gallop and no friction rub.   No murmur heard. Respiratory: Effort normal. No respiratory distress. He has no wheezes. He has rales (Bibasilar).  GI: Soft. Bowel sounds are normal. He exhibits no distension. There is no tenderness.  Musculoskeletal: Normal range of motion. He exhibits no edema.  No arthritis, no gout  Lymphadenopathy:    He has no cervical adenopathy.  Neurological: He is alert and oriented to person, place, and time. No cranial nerve deficit.  No dysarthria, no aphasia  Skin: Skin is warm and dry. No rash noted. No erythema.  Psychiatric: He has a normal mood and affect. His behavior is normal. Judgment and thought content normal.    LABORATORY PANEL:   CBC  Recent Labs Lab 06/23/16 1646  WBC 6.2  HGB 13.1  HCT 37.8*  PLT 192   ------------------------------------------------------------------------------------------------------------------  Chemistries   Recent Labs Lab 06/23/16 1646  NA 136  K 4.1  CL 105  CO2 22  GLUCOSE 112*  BUN 25*  CREATININE 1.02  CALCIUM 9.3   ------------------------------------------------------------------------------------------------------------------  Cardiac Enzymes  Recent Labs Lab  06/23/16 1646  TROPONINI <0.03   ------------------------------------------------------------------------------------------------------------------  RADIOLOGY:  Dg Chest 2 View  Result Date: 06/23/2016 CLINICAL DATA:  66 year old male with shortness of breath for the past 3 4 months since starting the medication. Initial encounter. EXAM: CHEST  2 VIEW COMPARISON:  03/01/2010. FINDINGS: Post aortic valve replacement.  Heart size within normal limits. Chronic lung changes without infiltrate, congestive heart failure or pneumothorax. No plain film evidence of pulmonary malignancy. Surgical clips overlie the right axilla/lateral chest. Mild degenerative changes thoracic spine. IMPRESSION: Chronic lung changes without acute pulmonary abnormality noted. Post aortic valve replacement. Electronically Signed   By: Genia Del M.D.   On: 06/23/2016 17:42    EKG:   Orders placed or performed during the hospital encounter of 06/23/16  . ED EKG  . ED EKG  . EKG 12-Lead  . EKG 12-Lead    IMPRESSION AND PLAN:  Principal Problem:   Dyspnea on exertion - unclear etiology at this time. ED physician has ordered CTA chest which is pending, will admit him to telemetry, get an echocardiogram in the morning as well as a cardiology consult. Active Problems:   HTN (hypertension) - stable, continue home meds   Type 2 diabetes mellitus (HCC) - sliding scale insulin with corresponding glucose checks   H/O aortic valve replacement - continue anticoagulation   Chronic obstructive pulmonary disease ([place]) - continue home inhalers   Hyperlipidemia - continue home meds  All the records are reviewed and case discussed with ED provider. Management plans discussed with the patient and/or family.  DVT PROPHYLAXIS: Systemic anticoagulation  GI PROPHYLAXIS: None  ADMISSION STATUS: Inpatient  CODE STATUS: Full Code Status History    This patient does not have a recorded code status. Please follow your  organizational policy for patients in this situation.    Advance Directive Documentation     Most Recent Value  Type of Advance Directive  Healthcare Power of Attorney  Pre-existing out of facility DNR order (yellow form or pink MOST form)  -  "MOST" Form in Place?  -      TOTAL TIME TAKING CARE OF THIS PATIENT: 45 minutes.   Kaiya Boatman [place] 06/23/2016, 10:37 PM  [facility]  Office  [phone]  CC: Primary care physician; Alexander Spikes, DO  Note:  This document was  prepared using Systems analyst and may include unintentional dictation errors.

## 2016-06-23 NOTE — Telephone Encounter (Signed)
Returned call to patient [place]") Short of breath for a few months that has worsened with moderate exertion He states he has put on a little weight over the winter months (10-12lbs) He does not exercise, sits at a desk on a computer He can be laying in the bed and roll over and then he cannot breathe and has to get up He states he has the feeling of being bloated all the time - he seldom overeats Inquired about recent med changes -  he states he has bullous tinea (fungus) - he is taking oral Lamisil (?) for this. He has been taking this for 3 weeks - he will be taking this for a few months + Denies swelling, chest pain/discomfort  Routed to Dr. [name] for advice  H/O AVR, COPD, HTN Per MD note 12/2015:  Assessment / Plan: 1. Status post mechanical aortic valve replacement. Continue anticoagulation with Coumadin. Echo in 2014 showed normal valve function. Recent cardiac [facility] was satisfactory. Valve exam is normal.

## 2016-06-23 NOTE — Telephone Encounter (Signed)
In ED

## 2016-06-23 NOTE — ED Notes (Signed)
Pt states increased weakness x 2 weeks, and dizziness with standing quickly. Pt states increased SOB x3-4 months, worse x2 weeks.

## 2016-06-24 ENCOUNTER — Inpatient Hospital Stay: Payer: Medicare Other

## 2016-06-24 DIAGNOSIS — J449 Chronic obstructive pulmonary disease, unspecified: Secondary | ICD-10-CM | POA: Diagnosis not present

## 2016-06-24 DIAGNOSIS — R0609 Other forms of dyspnea: Secondary | ICD-10-CM

## 2016-06-24 DIAGNOSIS — R5381 Other malaise: Secondary | ICD-10-CM

## 2016-06-24 DIAGNOSIS — Z952 Presence of prosthetic heart valve: Secondary | ICD-10-CM | POA: Diagnosis not present

## 2016-06-24 LAB — BASIC METABOLIC PANEL
Anion gap: 7 (ref 5–15)
BUN: 23 mg/dL — ABNORMAL HIGH (ref 6–20)
CO2: 23 mmol/L (ref 22–32)
Calcium: 9 mg/dL (ref 8.9–10.3)
Chloride: 103 mmol/L (ref 101–111)
Creatinine, Ser: 1.22 mg/dL (ref 0.61–1.24)
GFR calc Af Amer: >60 mL/min (ref >60)
GFR calc non Af Amer: 60 mL/min — ABNORMAL LOW (ref >60)
Glucose, Bld: 244 mg/dL — ABNORMAL HIGH (ref 65–99)
Potassium: 4.5 mmol/L (ref 3.5–5.1)
Sodium: 133 mmol/L — ABNORMAL LOW (ref 135–145)

## 2016-06-24 LAB — CBC
HCT: 37.5 % — ABNORMAL LOW (ref 40.0–52.0)
Hemoglobin: 13.3 g/dL (ref 13.0–18.0)
MCH: 32.5 pg (ref 26.0–34.0)
MCHC: 35.4 g/dL (ref 32.0–36.0)
MCV: 91.8 fL (ref 80.0–100.0)
Platelets: 174 10*3/uL (ref 150–440)
RBC: 4.08 MIL/uL — ABNORMAL LOW (ref 4.40–5.90)
RDW: 13.8 % (ref 11.5–14.5)
WBC: 4.5 10*3/uL (ref 3.8–10.6)

## 2016-06-24 LAB — PROTIME-INR
INR: 1.95
Prothrombin Time: 22.5 seconds — ABNORMAL HIGH (ref 11.4–15.2)

## 2016-06-24 LAB — GLUCOSE, CAPILLARY
Glucose-Capillary: 107 mg/dL — ABNORMAL HIGH (ref 65–99)
Glucose-Capillary: 146 mg/dL — ABNORMAL HIGH (ref 65–99)
Glucose-Capillary: 149 mg/dL — ABNORMAL HIGH (ref 65–99)
Glucose-Capillary: 184 mg/dL — ABNORMAL HIGH (ref 65–99)
Glucose-Capillary: 240 mg/dL — ABNORMAL HIGH (ref 65–99)

## 2016-06-24 MED ORDER — INSULIN ASPART 100 UNIT/ML ~~LOC~~ SOLN: 0.0000 [IU] | Freq: Every day | SUBCUTANEOUS | Status: DC

## 2016-06-24 MED ORDER — WARFARIN - PHARMACIST DOSING INPATIENT: Freq: Every day | Status: DC

## 2016-06-24 MED ORDER — WARFARIN SODIUM 5 MG PO TABS: 10.0000 mg | ORAL_TABLET | ORAL | Status: DC

## 2016-06-24 MED ORDER — WARFARIN - PHARMACIST DOSING INPATIENT
Freq: Every day | Status: DC
  Administered 2016-06-24: 18:00:00

## 2016-06-24 MED ORDER — ONDANSETRON HCL 4 MG PO TABS: 4.0000 mg | ORAL_TABLET | Freq: Four times a day (QID) | ORAL | Status: DC | PRN

## 2016-06-24 MED ORDER — WARFARIN SODIUM 5 MG PO TABS: 7.0000 mg | ORAL_TABLET | ORAL | Status: DC

## 2016-06-24 MED ORDER — DIPHENHYDRAMINE HCL 50 MG/ML IJ SOLN
25.0000 mg | Freq: Once | INTRAMUSCULAR | Status: AC
  Administered 2016-06-24: 25 mg via INTRAVENOUS
  Filled 2016-06-24: qty 0.5

## 2016-06-24 MED ORDER — INSULIN ASPART 100 UNIT/ML ~~LOC~~ SOLN
0.0000 [IU] | Freq: Three times a day (TID) | SUBCUTANEOUS | Status: DC
  Administered 2016-06-24: 3 [IU] via SUBCUTANEOUS
  Administered 2016-06-24: 2 [IU] via SUBCUTANEOUS
  Administered 2016-06-25: 1 [IU] via SUBCUTANEOUS
  Filled 2016-06-24: qty 1
  Filled 2016-06-24: qty 3
  Filled 2016-06-24: qty 2
  Filled 2016-06-24: qty 1

## 2016-06-24 MED ORDER — TAMSULOSIN HCL 0.4 MG PO CAPS
0.8000 mg | ORAL_CAPSULE | Freq: Every day | ORAL | Status: DC
  Administered 2016-06-24: 0.8 mg via ORAL
  Filled 2016-06-24: qty 2

## 2016-06-24 MED ORDER — DOCUSATE SODIUM 100 MG PO CAPS
200.0000 mg | ORAL_CAPSULE | Freq: Two times a day (BID) | ORAL | Status: DC
  Administered 2016-06-24 – 2016-06-25 (×2): 200 mg via ORAL
  Filled 2016-06-24 (×2): qty 2

## 2016-06-24 MED ORDER — IOPAMIDOL (ISOVUE-370) INJECTION 76%
75.0000 mL | Freq: Once | INTRAVENOUS | Status: AC | PRN
  Administered 2016-06-24: 75 mL via INTRAVENOUS

## 2016-06-24 MED ORDER — ASPIRIN EC 81 MG PO TBEC
81.0000 mg | DELAYED_RELEASE_TABLET | Freq: Every day | ORAL | Status: DC
  Administered 2016-06-24 (×2): 81 mg via ORAL
  Filled 2016-06-24 (×2): qty 1

## 2016-06-24 MED ORDER — ACETAMINOPHEN 325 MG PO TABS: 650.0000 mg | ORAL_TABLET | Freq: Four times a day (QID) | ORAL | Status: DC | PRN

## 2016-06-24 MED ORDER — WARFARIN SODIUM 5 MG PO TABS
10.0000 mg | ORAL_TABLET | Freq: Once | ORAL | Status: AC
  Administered 2016-06-24: 10 mg via ORAL
  Filled 2016-06-24: qty 2

## 2016-06-24 MED ORDER — ONDANSETRON HCL 4 MG/2ML IJ SOLN: 4.0000 mg | Freq: Four times a day (QID) | INTRAMUSCULAR | Status: DC | PRN

## 2016-06-24 MED ORDER — METOPROLOL SUCCINATE ER 50 MG PO TB24
50.0000 mg | ORAL_TABLET | Freq: Every day | ORAL | Status: DC
  Administered 2016-06-25: 50 mg via ORAL
  Filled 2016-06-24 (×2): qty 1

## 2016-06-24 MED ORDER — ACETAMINOPHEN 650 MG RE SUPP: 650.0000 mg | Freq: Four times a day (QID) | RECTAL | Status: DC | PRN

## 2016-06-24 NOTE — Care Management Obs Status (Signed)
[place] NOTIFICATION   Patient Details  Name: Alexander Barton MRN: [id number] Date of Birth: 06-Feb-1950   Medicare Observation Status Notification Given:  Yes    Katrina Stack, RN 06/24/2016, 12:11 PM

## 2016-06-24 NOTE — Progress Notes (Signed)
Hold metoprolol per Dr Bridgett Larsson

## 2016-06-24 NOTE — Progress Notes (Addendum)
ANTICOAGULATION CONSULT NOTE - Initial Consult  Pharmacy Consult for warfarin dosing Indication: mechanical valve replacement  Allergies  Allergen Reactions  . Codeine Anaphylaxis  . Hydrocodone Anaphylaxis  . Statins Other (See Comments)    Muscle spasm  . Hydromorphone Other (See Comments)    Pt says he has never taken  . Ivp Dye [Iodinated Diagnostic Agents] Other (See Comments)    Uncontrollable sneezing, needs sedative the night before and hour and benadryl before using  . Lipitor [Atorvastatin Calcium] Other (See Comments)    Myalgias   . Other     Hydromorphone  . Seroquel [Quetiapine Fumerate] Other (See Comments)    "bad trip" tongue swell    Patient Measurements: Height: 6\' 2"  (188 cm) Weight: 220 lb (99.8 kg) IBW/kg (Calculated) : 82.2 Heparin Dosing Weight:   Vital Signs: Temp: 97.6 F (36.4 C) (05/22 0016) Temp Source: Oral (05/22 0016) BP: 140/75 (05/22 0016) Pulse Rate: 55 (05/22 0016)  Labs:  Recent Labs  06/23/16 1646 06/23/16 2132  HGB 13.1  --   HCT 37.8*  --   PLT 192  --   LABPROT  --  23.7*  INR  --  2.08  CREATININE 1.02  --   TROPONINI <0.03  --     Estimated Creatinine Clearance: 88.7 mL/min (by C-G formula based on SCr of 1.02 mg/dL).   Medical History: Past Medical History:  Diagnosis Date  . Aortic aneurysm, thoracic ([place])   . Aortic valve disease   . Arthritis   . BPH (benign prostatic hyperplasia)   . COPD (chronic obstructive pulmonary disease) ([place])   . GERD (gastroesophageal reflux disease)   . HTN (hypertension)   . Hypercholesterolemia   . Stroke ([place])   . TIA (transient ischemic attack)     Medications:  Scheduled:  . aspirin EC  81 mg Oral QHS  . insulin aspart  0-5 Units Subcutaneous QHS  . insulin aspart  0-9 Units Subcutaneous TID WC  . metoprolol succinate  50 mg Oral Daily  . tamsulosin  0.8 mg Oral QHS  . [START ON 06/25/2016] warfarin  10 mg Oral Q M,W,F-1800   And  . warfarin  7 mg Oral Q  T,Th,S,Su-1800    Assessment: Pt. Admitted for SOB and is being anticoagulated w/ warfarin for mechanical valve replacement. Per Pt. And wife warfarin home dose: MWF: warfarin 10 mg TThSaSun: Warfarin 7 mg  5/21 INR: 2.08  Goal of Therapy:  INR 2.5 - 3.0 (per anticoag clinic visit) Monitor platelets by anticoagulation protocol: Yes   Plan:  Will restart pts. Home warfarin regimen as listed above. Will continue to monitor daily INRs and monitor CBC as well as s/sx of bleeding.  ADD:  Will give one time dose of warfarin 10 mg PO x 1. Will recheck INR with am labs.

## 2016-06-24 NOTE — Progress Notes (Signed)
[facility] [place] NAME: Alexander Barton    MR#:  [id number]  DATE OF BIRTH:  06/15/1950  SUBJECTIVE:  CHIEF COMPLAINT:   Chief Complaint  Patient presents with  . Shortness of Breath  . COPD   No complaints. REVIEW OF SYSTEMS:  Review of Systems  Constitutional: Negative for chills, fever and malaise/fatigue.  HENT: Negative for sinus pain and sore throat.   Eyes: Negative for blurred vision and double vision.  Respiratory: Negative for cough, sputum production, wheezing and stridor.   Cardiovascular: Negative for chest pain, palpitations and leg swelling.  Gastrointestinal: Negative for abdominal pain, blood in stool, constipation, diarrhea, melena, nausea and vomiting.  Genitourinary: Negative for dysuria and hematuria.  Musculoskeletal: Negative for back pain.  Skin: Negative for itching and rash.  Neurological: Negative for dizziness, focal weakness, loss of consciousness and weakness.  Psychiatric/Behavioral: Negative for depression. The patient is not nervous/anxious.     DRUG ALLERGIES:   Allergies  Allergen Reactions  . Codeine Anaphylaxis  . Hydrocodone Anaphylaxis  . Statins Other (See Comments)    Muscle spasm  . Hydromorphone Other (See Comments)    Pt says he has never taken  . Ivp Dye [Iodinated Diagnostic Agents] Other (See Comments)    Uncontrollable sneezing, needs sedative the night before and hour and benadryl before using  . Lipitor [Atorvastatin Calcium] Other (See Comments)    Myalgias   . Other     Hydromorphone  . Seroquel [Quetiapine Fumerate] Other (See Comments)    "bad trip" tongue swell   VITALS:  Blood pressure 113/67, pulse 66, temperature 98.6 F (37 C), temperature source Oral, resp. rate 16, height 6\' 2"  (1.88 m), weight 222 lb 12.8 oz (101.1 kg), SpO2 95 %. PHYSICAL EXAMINATION:  Physical Exam  Constitutional: He is oriented to person, place, and time and well-developed,  well-nourished, and in no distress.  HENT:  Head: Normocephalic.  Mouth/Throat: Oropharynx is clear and moist.  Eyes: Conjunctivae and EOM are normal. Pupils are equal, round, and reactive to light. No scleral icterus.  Neck: Normal range of motion. Neck supple. No JVD present. No tracheal deviation present.  Pulmonary/Chest: Effort normal and breath sounds normal. No respiratory distress. He has no wheezes. He has no rales.  Abdominal: Soft. Bowel sounds are normal. He exhibits no distension. There is no tenderness.  Musculoskeletal: Normal range of motion. He exhibits no edema or tenderness.  Neurological: He is alert and oriented to person, place, and time. No cranial nerve deficit.  Skin: No rash noted. No erythema.  Psychiatric: Affect and judgment normal.   LABORATORY PANEL:  Male CBC  Recent Labs Lab 06/24/16 0522  WBC 4.5  HGB 13.3  HCT 37.5*  PLT 174   ------------------------------------------------------------------------------------------------------------------ Chemistries   Recent Labs Lab 06/24/16 0522  NA 133*  K 4.5  CL 103  CO2 23  GLUCOSE 244*  BUN 23*  CREATININE 1.22  CALCIUM 9.0   RADIOLOGY:  Dg Chest 2 View  Result Date: 06/23/2016 CLINICAL DATA:  66 year old male with shortness of breath for the past 3 4 months since starting the medication. Initial encounter. EXAM: CHEST  2 VIEW COMPARISON:  03/01/2010. FINDINGS: Post aortic valve replacement.  Heart size within normal limits. Chronic lung changes without infiltrate, congestive heart failure or pneumothorax. No plain film evidence of pulmonary malignancy. Surgical clips overlie the right axilla/lateral chest. Mild degenerative changes thoracic spine. IMPRESSION: Chronic lung changes without acute pulmonary abnormality noted.  Post aortic valve replacement. Electronically Signed   By: Genia Del M.D.   On: 06/23/2016 17:42   Ct Angio Chest Pe W Or Wo Contrast  Result Date: 06/24/2016 CLINICAL  DATA:  Shortness of breath. Worsening over the past several months. History of aortic aneurysm and aortic valve replacement. Subtherapeutic INR. EXAM: CT ANGIOGRAPHY CHEST WITH CONTRAST TECHNIQUE: Multidetector CT imaging of the chest was performed using the standard protocol during bolus administration of intravenous contrast. Multiplanar CT image reconstructions and MIPs were obtained to evaluate the vascular anatomy. CONTRAST:  75 mL Isovue 370 COMPARISON:  10/17/2002 FINDINGS: Cardiovascular: Satisfactory opacification of the pulmonary arteries to the segmental level. No evidence of pulmonary embolism. Normal heart size. No pericardial effusion. Aortic valve prosthesis. Mediastinum/Nodes: No enlarged mediastinal, hilar, or axillary lymph nodes. Thyroid gland, trachea, and esophagus demonstrate no significant findings. Lungs/Pleura: Lungs are clear. No pleural effusion or pneumothorax. 3 mm nodule left mid lung is unchanged since prior study consistent with benign etiology. Upper Abdomen: Cholelithiasis with multiple stones in the gallbladder. No inflammatory changes or bile duct dilatation identified. Musculoskeletal: Degenerative changes in the spine. No destructive bone lesions. Sternotomy wires. Review of the MIP images confirms the above findings. IMPRESSION: No evidence of significant pulmonary embolus. No evidence of active pulmonary disease. Cholelithiasis. Electronically Signed   By: Lucienne Capers M.D.   On: 06/24/2016 03:03   ASSESSMENT AND PLAN:    Dyspnea on exertion - unclear etiology at this time. No PE per CTA chest  F/u echocardiogram and Lexiscan Myoview on 5/23 to evaluate for high-risk ischemia per cardiology consult.    HTN (hypertension) - stable, continue home meds   Type 2 diabetes mellitus (HCC) - sliding scale insulin with corresponding glucose checks   H/O aortic valve replacement - continue anticoagulation, Coumadin per pharmacy    Chronic obstructive pulmonary disease  ([place]) - continue home inhalers   Hyperlipidemia - continue home meds  All the records are reviewed and case discussed with Care Management/Social Worker. Management plans discussed with the patient, his wife and they are in agreement.  CODE STATUS: Full Code  TOTAL TIME TAKING CARE OF THIS PATIENT: 28 minutes.   More than 50% of the time was spent in counseling/coordination of care: YES  POSSIBLE D/C IN 1-2 DAYS, DEPENDING ON CLINICAL CONDITION.   Demetrios Loll M.D on 06/24/2016 at 3:05 PM  Between 7am to 6pm - Pager - [phone]  After 6pm go to [email or web address] - Proofreader  [employer]  [facility]  Office  [phone]  CC: Primary care physician; Coral Spikes, DO  Note: This dictation was prepared with Dragon dictation along with smaller phrase technology. Any transcriptional errors that result from this process are unintentional.

## 2016-06-24 NOTE — Telephone Encounter (Signed)
It looks like he was admitted to the hospital last night by hospitalist service.  [name] MD, [facility]

## 2016-06-24 NOTE — Care Management CC44 (Signed)
Condition Code 44 Documentation Completed  Patient Details  Name: ARSAL TAPPAN MRN: [id number] Date of Birth: [birth year]   Condition Code 44 given:  Yes Patient signature on Condition Code 44 notice:  Yes Documentation of 2 MD's agreement:  Yes Code 44 added to claim:  Yes    Katrina Stack, RN 06/24/2016, 12:11 PM

## 2016-06-24 NOTE — Consult Note (Signed)
Cardiology Consultation Note  Patient ID: Alexander Barton, MRN: [id number], DOB/AGE: 68-Sep-1952 68 y.o. Admit date: 06/23/2016   Date of Consult: 06/24/2016 Primary Physician: Coral Spikes, DO Primary Cardiologist: Dr. [name], MD Requesting Physician: Dr. Jannifer Franklin, MD  Chief Complaint: DOE x years, worse over the past month Reason for Consult: Same  HPI: Alexander Barton is a 66 y.o. male who is being seen today for the evaluation of DOE x years, worse over the past month  at the request of Dr. Jannifer Franklin, MD. 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   He was most recently seen by Dr. [name] in 12/2015 for routine follow up and was doing well at that time. He noted his chronic DOE with climbing stairs that had been stable. He also had recently ahd a cardiac MRI at [facility] in 10/2015 that showed normal LV function, moderate biatrial enlargement, normal prosthetic valve function, no aneurysm.   Over the past month the patient has noticed his DOE has progressively gotten worse, now affecting him with ambulation on flat surfaces, noting he will become SOB with ambulation of approximately 20 steps in his office. Neer with chest pain. He also notes over the past 5-6 years he has become much more sedentary and no longer walks daily with his wife, though he wants to get back into this. Secondly, he has recently stopped taking his Spiriva for his COPD 2/2 constipation and now uses  essential oils to assist with his COPD. Patient was recently at [place] and would note increased SOB when ambulating up the two flights of stairs to get to the bedroom. Because of this along with SOB on flat surfaces now he contacted our office for an appointment. However, the patient decided he wanted to be "checked out" and presented to [facility]. No acute worsening of his above symptoms.   Upon the patient's arrival to [facility] they were found to have BP 11/71, HR 55 bpm, temp 98, oxygen saturation 96% on room air, weight 220 pounds. EKG showed sinus bradycardia, 54 bpm, TWI aVL, CXR showed chronic lung changes without acute process noted. CTA chest negative for PE without evidence of active pulmonary disease. Labs showed BNP 192, troponin negative x 1, SCr 1.02, K+ 4.1, WBC 6.2, HGB 13.1, PLT 192, INR 2.08-->1.95. In the ED he was given Solu-Medrol and admitted. Upon admission he was contind on home medications and cardiology was asked to evaluate.   Currently, resting in bed comfortably with wife at bedside. No SOB at rest. Never with chest pain or LE swelling. Over this pat month he has noted increased SOB with minimal exertion, such as ambulating approximately 20 steps in his office. He has also noted increased abdominal swelling with early satiety. No cough or orthopnea. On most nights he will develop PND and gets up to go sit in his recliner to finish his nighttime sleep in a chair, this has been going on for approximately 1 year. Never with prior sleep study.  He last weighed himself at work approximately 6 months prior with a patient reported weight of 225 pounds. He did not acutely get worse prompting him to come into the hospital, it was just the culmination of the past month with increased SOb and he felt like he should be evaluated.   Past Medical History:  Diagnosis Date  . Aortic aneurysm, thoracic ([place])   . Aortic valve disease   . Arthritis   . BPH (benign prostatic hyperplasia)   . COPD  (chronic obstructive pulmonary disease) ([place])   . GERD (gastroesophageal reflux disease)   . HTN (hypertension)   . Hypercholesterolemia   . Stroke ([place])   . TIA (transient ischemic attack)       Most Recent Cardiac Studies: Cardiac MRI 10/2015: 1.The left ventricle is normal in size and wall thickness.Regional and global systolic function are normal.The LVEF is visually estimated at 60%.  2.The right ventricle is normal in size, wall thickness with normal systolic function. 3. The left atrium is moderately enlarged.The right atrium is moderately enlarged.An interatrial septal aneurysm is present.  4.Abi-leaflet mechanical prosthesis in a valved ascending aortic conduit is present. Distal anastomatic suture site in the proximal arch is noted without evidence of obstruction.The peak velocity through the bioprosthetic aortic valve is 2.3 m/sec with mean gradient of 11 mm Hg.There is trivial aortic regurgitation.  5. There is moderate tricuspid regurgitation. There is trivial pulmonic regurgitation. 6.The mitral valve leaflets are thickened.There is trivial mitral regurgitation.  7.Delayed enhancement imaging demonstrates a small focal region of basal anterolateral segmental hyperenhancement, suggesting a small infarct in this region. 8.There is no evidence of an intracardiac thrombus.  Thoracic MRA 1.Post surgical changes compatible with aortic valve replacement,ascending aortic graft, and hemi-arch reconstruction are noted. The aortic root is normal in size.The ascending aorta is normal in size.There is a bend in the ascending aorta.The proximal arch is upper limits of normal in size.The mid arch is dilated to 4.3 x 4.2 cm.The distal arch is dilated to 3.3 x 3.3 cm.Mild irregularity of the caudal surface of the distal portion of the arch graft is present without evidence altered flow dynamics. The descending thoracic aorta is normal in  caliber.  Aortic luminal MRA measurements: Aortic Root: 3.4 x 3.4 cm (prior 3.4 x 3.3 cm) Aortic Root at the sinotubular junction: 3.1 x 2.8 cm (prior 3.0 x 2.9 cm) Proximal transverse graft (prior to the innominate artery): 3.1 x 2.7 cm (prior 3.0 x 2.9 cm) Mid transverse graft (after the rt innominate artery): 4.3 x 4.2 cm (prior 4.2 x 4.2 cm) Distal transverse graft (distal to the left subclavian artery): 3.3 x 3.3 cm (prior 3.4 x 3.2 cm) Descending thoracic aorta (PA bifurcation): 2.7 x 2.7 cm (prior 2.7 x 2.7 cm) Descending thoracic aorta (diaphragm):2.5 x 2.4 cm (prior 2.5 x 2.5 cm) Compared to the prior scan, there have been no significant changes.  2.The aortic arch is left sided with normal branching pattern.There is no evidence of aortic dissection flap, coarctation, or patent ductus arteriosus.  3.There are normal systemic and pulmonary venous connections. 4.The pulmonary artery is normal in size.   Surgical History:  Past Surgical History:  Procedure Laterality Date  . ACHILLES TENDON REPAIR    . AORTIC VALVE REPLACEMENT     #23 On-X valve conduit  . ASCENDING AORTIC ANEURYSM REPAIR    . BACK SURGERY    . FOOT SURGERY       Home Meds: Prior to Admission medications  Medication Sig Start Date End Date Taking? Authorizing Provider  acidophilus (RISAQUAD) CAPS capsule Take 2 capsules by mouth daily.   Yes [provider]  Apoaequorin (PREVAGEN) 10 MG CAPS Take 10 mg by mouth. Reported on 08/17/2015   Yes [provider]  aspirin 81 MG tablet Take 81 mg by mouth at bedtime.    Yes [provider]  cholecalciferol (VITAMIN D) 1000 units tablet Take 1,000 Units by mouth daily.   Yes [provider]  metoprolol succinate (TOPROL-XL) 50 MG 24 hr tablet TAKE 1 TABLET (50 MG TOTAL) BY MOUTH DAILY. TAKE WITH OR IMMEDIATELY FOLLOWING A MEAL. 06/04/16  Yes Lacinda Axon, Jayce G, DO  Multiple Vitamin (MULTIVITAMIN WITH MINERALS) TABS tablet Take 1  tablet by mouth 2 (two) times daily.   Yes [provider]  Nutritional Supplements (VITAMIN D PLUS COFACTORS PO) Take 1 capsule by mouth every evening.   Yes [provider]  omega-3 acid ethyl esters (LOVAZA) 1 g capsule Take 1 g by mouth every evening.   Yes [provider]  SPIRULINA PO Take 3 capsules by mouth daily.   Yes [provider]  tamsulosin (FLOMAX) 0.4 MG CAPS capsule TAKE 2 CAPSULES (0.8 MG TOTAL) BY MOUTH DAILY. Patient taking differently: Take 0.8 mg by mouth at bedtime.  11/05/15  Yes Cook, Jayce G, DO  terbinafine (LAMISIL) 250 MG tablet Take 250 mg by mouth at bedtime.   Yes [provider]  warfarin (COUMADIN) 2 MG tablet Take 2 mg by mouth as directed. Take with 5 mg tab on Tuesday, Thursday, Saturday and Sunday for a total of 7 mg.   Yes [provider]  warfarin (COUMADIN) 5 MG tablet Take 5-10 mg by mouth at bedtime. 10mg  on Monday, Wednesday, Friday. Take with 2mg  tab for a total 7mg  on all other days.   Yes [provider]    Inpatient Medications:  . aspirin EC  81 mg Oral QHS  . insulin aspart  0-5 Units Subcutaneous QHS  . insulin aspart  0-9 Units Subcutaneous TID WC  . metoprolol succinate  50 mg Oral Daily  . tamsulosin  0.8 mg Oral QHS  . [START ON 06/25/2016] warfarin  10 mg Oral Q M,W,F-1800   And  . warfarin  7 mg Oral Q T,Th,S,Su-1800     Allergies:  Allergies  Allergen Reactions  . Codeine Anaphylaxis  . Hydrocodone Anaphylaxis  . Statins Other (See Comments)    Muscle spasm  . Hydromorphone Other (See Comments)    Pt says he has never taken  . Ivp Dye [Iodinated Diagnostic Agents] Other (See Comments)    Uncontrollable sneezing, needs sedative the night before and hour and benadryl before using  . Lipitor [Atorvastatin Calcium] Other (See Comments)    Myalgias   . Other     Hydromorphone  . Seroquel [Quetiapine Fumerate] Other (See Comments)    "bad trip" tongue swell     Social History   Social History  . Marital status: Married    Spouse name: N/A  . Number of children: 2  . Years of education: N/A   Occupational History  . SALES [employer]   Social History Main Topics  . Smoking status: Never Smoker  . Smokeless tobacco: Never Used  . Alcohol use No  . Drug use: No  . Sexual activity: Not Currently   Other Topics Concern  . 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   One son and one daughter   He lives in [place]   One caffeinated beverage daily   08/30/2014        Family History  Problem Relation Age of Onset  . Alzheimer's disease Father   . Diabetes Father        age onset DM  . Hypertension Sister   . Hypertension Brother   . Diabetes Mother   . Diabetes Sister   . Colon cancer Neg Hx      Review of Systems: Review of Systems  Constitutional: Positive for malaise/fatigue. Negative for chills, diaphoresis, fever and weight loss.  HENT: Negative for congestion.   Eyes: Negative for discharge and redness.  Respiratory: Positive for shortness of breath. Negative for cough, hemoptysis, sputum production and wheezing.   Cardiovascular: Positive for PND. Negative for chest pain, palpitations, orthopnea, claudication and leg swelling.       Abdominal distension   Gastrointestinal: Negative for abdominal pain, blood in stool, heartburn, melena, nausea and vomiting.  Genitourinary: Negative for hematuria.  Musculoskeletal: Negative for falls and myalgias.  Skin: Negative for rash.  Neurological: Positive for weakness. Negative for dizziness, tingling, tremors, sensory change, speech change, focal weakness and loss of consciousness.  Endo/Heme/Allergies: Does not bruise/bleed easily.  Psychiatric/Behavioral: Negative for substance abuse. The patient has insomnia. The patient is not nervous/anxious.   All other systems reviewed  and are negative.   Labs:  Recent Labs  06/23/16 1646  TROPONINI <0.03   Lab Results  Component Value Date   WBC 4.5 06/24/2016   HGB 13.3 06/24/2016   HCT 37.5 (L) 06/24/2016   MCV 91.8 06/24/2016   PLT 174 06/24/2016     Recent Labs Lab 06/24/16 0522  NA 133*  K 4.5  CL 103  CO2 23  BUN 23*  CREATININE 1.22  CALCIUM 9.0  GLUCOSE 244*   Lab Results  Component Value Date   CHOL 174 10/03/2015   HDL 47.20 10/03/2015   LDLCALC 87 10/03/2015   TRIG 195.0 (H) 10/03/2015   No results found for: DDIMER  Radiology/Studies:  Dg Chest 2 View  Result Date: 06/23/2016 IMPRESSION: Chronic lung changes without acute pulmonary abnormality noted. Post aortic valve replacement. Electronically Signed   By: Genia Del M.D.   On: 06/23/2016 17:42   Ct Angio Chest Pe W Or Wo Contrast  Result Date: 06/24/2016 IMPRESSION: No evidence of significant pulmonary embolus. No evidence of active pulmonary disease. Cholelithiasis. Electronically Signed   By: Lucienne Capers M.D.   On: 06/24/2016 03:03    EKG: Interpreted by me showed: sinus bradycardia, 54 bpm, TWI aVL Telemetry: Interpreted by me showed: sinus bradycardia, upper 50s bpm  Weights: Filed Weights   06/23/16 1643 06/24/16 0016 06/24/16 0346  Weight: 220 lb (99.8 kg) 222 lb 12.8 oz (101.1 kg) 222 lb 12.8 oz (101.1 kg)     Physical Exam: Blood pressure (!) 102/53, pulse 72, temperature 97.9 F (36.6 C), temperature source Oral, resp. rate 18, height 6\' 2"  (1.88 m), weight 222 lb 12.8 oz (101.1 kg), SpO2 94 %. Body mass index is 28.61 kg/m. General: Well developed, well nourished, in no acute distress. Head: Normocephalic, atraumatic, sclera non-icteric, no xanthomas, nares are without discharge.  Neck: Negative for carotid bruits. JVD not elevated. Lungs: Clear bilaterally to auscultation without wheezes, rales, or rhonchi. Breathing is unlabored. Heart: Bradycardic, with S1 S2. II/VI systolic murmur with  metallic click c/w prior metallic AVR, no rubs, or gallops appreciated. Abdomen: Soft, non-tender, non-distended with normoactive bowel sounds. No hepatomegaly. No rebound/guarding. No obvious abdominal masses. Msk:  Strength and tone appear normal for age. Extremities: No clubbing or cyanosis. No edema. Distal pedal pulses are 2+ and equal bilaterally. Neuro: Alert and oriented X 3. No facial asymmetry. No focal deficit. Moves all extremities spontaneously. Psych:  Responds to questions appropriately with a normal affect.    Assessment and Plan:  Principal Problem:   Dyspnea on exertion Active Problems:   H/O aortic valve replacement   Chronic obstructive pulmonary disease (HCC)   Physical deconditioning   HTN (hypertension)   Hyperlipidemia   Type 2 diabetes mellitus ([place])    1. DOE: -No acute worsening over the past month leading him to present -Uncertain etiology, though cannot rule out cardiac, pulmonary, or deconditioning at this time -Less likely infection -Currently on room air at rest, would benefit from oxygen challenge test to evaluate if he saturates -Check TTE to evaluate for reduction in LVSF, wall motion, and to evaluate aortic valve function -Schedule for Lexiscan Myoview on 5/23 to evaluate for high-risk ischemia -If the above testing is unrevealing of etiology, he would benefit from pulmonary evaluation, likely as an outpatient to further evaluate his possible COPD (needs to establish with pulmonologist regardless), may benefit from re-trial of Spiriva, as well as outpatient cardiology follow up  -Would benefit from PT given his deconditioning   2. Status post mechanical AVR: -Check TTE as above -INR subtherapeutic -Coumadin per pharmacy   3. Possible COPD: -As above  4. Prior stroke: -Continue dual therapy with ASA and Coumadin -Stable  5. HTN: -Controlled -Continue current medications  6. HLD: -Crestor  Signed, Christell Faith, PA-C [name] Pager: [phone] 06/24/2016, 9:39 AM

## 2016-06-25 ENCOUNTER — Ambulatory Visit (HOSPITAL_BASED_OUTPATIENT_CLINIC_OR_DEPARTMENT_OTHER): Payer: Medicare Other

## 2016-06-25 ENCOUNTER — Other Ambulatory Visit: Payer: Self-pay | Admitting: Family Medicine

## 2016-06-25 ENCOUNTER — Observation Stay (HOSPITAL_BASED_OUTPATIENT_CLINIC_OR_DEPARTMENT_OTHER)
Admit: 2016-06-25 | Discharge: 2016-06-25 | Disposition: A | Discharge status: Home / Self Care | Payer: Medicare Other | Attending: Internal Medicine | Admitting: Internal Medicine

## 2016-06-25 ENCOUNTER — Telehealth: Payer: Self-pay | Admitting: *Deleted

## 2016-06-25 DIAGNOSIS — R0602 Shortness of breath: Secondary | ICD-10-CM | POA: Diagnosis not present

## 2016-06-25 DIAGNOSIS — I351 Nonrheumatic aortic (valve) insufficiency: Secondary | ICD-10-CM

## 2016-06-25 LAB — NM MYOCAR MULTI W/SPECT W/WALL MOTION / EF
LV dias vol: 99 mL (ref 62–150)
LV sys vol: 50 mL
Peak HR: 80 {beats}/min
Percent HR: 52 %
SDS: 1
SRS: 9
SSS: 6
TID: 1.03

## 2016-06-25 LAB — GLUCOSE, CAPILLARY
Glucose-Capillary: 141 mg/dL — ABNORMAL HIGH (ref 65–99)
Glucose-Capillary: 144 mg/dL — ABNORMAL HIGH (ref 65–99)

## 2016-06-25 LAB — PROTIME-INR
INR: 2.26
INR: 2.4 — AB (ref <=1.1)
INR: 2.4 — AB (ref <=1.1)
Prothrombin Time: 25.3 seconds — ABNORMAL HIGH (ref 11.4–15.2)

## 2016-06-25 MED ORDER — TIOTROPIUM BROMIDE MONOHYDRATE 18 MCG IN CAPS: 18.0000 ug | ORAL_CAPSULE | Freq: Every day | RESPIRATORY_TRACT | 12 refills | Status: DC

## 2016-06-25 MED ORDER — TECHNETIUM TC 99M TETROFOSMIN IV KIT
33.0180 | PACK | Freq: Once | INTRAVENOUS | Status: AC | PRN
  Administered 2016-06-25: 33.018 via INTRAVENOUS

## 2016-06-25 MED ORDER — REGADENOSON 0.4 MG/5ML IV SOLN
0.4000 mg | Freq: Once | INTRAVENOUS | Status: AC
Start: 2016-06-25 — End: 2016-06-25
  Administered 2016-06-25: 0.4 mg via INTRAVENOUS

## 2016-06-25 MED ORDER — TECHNETIUM TC 99M TETROFOSMIN IV KIT
13.0000 | PACK | Freq: Once | INTRAVENOUS | Status: AC | PRN
  Administered 2016-06-25: 12.46 via INTRAVENOUS

## 2016-06-25 NOTE — Telephone Encounter (Signed)
Patient went to the ED on 5/21 for SOB, looks like based on the note that he was discharged and no changes to his meds.  Please advise on his initial call on 5/21 he requested the [place] as he had taken it in the past for SOB. Thanks

## 2016-06-25 NOTE — Discharge Summary (Signed)
[facility] [place] NAME: Alexander Barton    MR#:  [id number]  DATE OF BIRTH:  26-Jun-1950  DATE OF ADMISSION:  06/23/2016   ADMITTING PHYSICIAN: Lance Coon, MD  DATE OF DISCHARGE: 06/25/2016  3:09 PM  PRIMARY CARE PHYSICIAN: Coral Spikes, DO   ADMISSION DIAGNOSIS:  SOB (shortness of breath) [R06.02] Dyspnea, unspecified type [R06.00] DISCHARGE DIAGNOSIS:  Principal Problem:   Dyspnea on exertion Active Problems:   HTN (hypertension)   Hyperlipidemia   Type 2 diabetes mellitus ([place])   H/O aortic valve replacement   Chronic obstructive pulmonary disease ([place])   Physical deconditioning  SECONDARY DIAGNOSIS:   Past Medical History:  Diagnosis Date  . Aortic aneurysm, thoracic ([place])   . Aortic valve disease   . Arthritis   . BPH (benign prostatic hyperplasia)   . COPD (chronic obstructive pulmonary disease) ([place])   . GERD (gastroesophageal reflux disease)   . HTN (hypertension)   . Hypercholesterolemia   . Stroke ([place])   . TIA (transient ischemic attack)    HOSPITAL COURSE:   Dyspnea on exertion - unclear etiology at this time. No PE per CTA chest  Per cardiology, unremarkable echocardiogram and Lexiscan Myoview.  HTN (hypertension) - stable, continue home meds Type 2 diabetes mellitus (HCC) - sliding scale insulin with corresponding glucose checks H/O aortic valve replacement - continue anticoagulation, continue Coumadin.  Chronic obstructive pulmonary disease (HCC) - continue home inhalers Hyperlipidemia - continue home meds He has no complaint. DISCHARGE CONDITIONS:  Stable, discharged to home today. CONSULTS OBTAINED:  Treatment Team:  Nelva Bush, MD DRUG ALLERGIES:   Allergies  Allergen Reactions  . Codeine Anaphylaxis  . Hydrocodone Anaphylaxis  . Statins Other (See Comments)    Muscle spasm  . Hydromorphone Other (See Comments)    Pt says he has never taken  . Ivp Dye [Iodinated  Diagnostic Agents] Other (See Comments)    Uncontrollable sneezing, needs sedative the night before and hour and benadryl before using  . Lipitor [Atorvastatin Calcium] Other (See Comments)    Myalgias   . Other     Hydromorphone  . Seroquel [Quetiapine Fumerate] Other (See Comments)    "bad trip" tongue swell   DISCHARGE MEDICATIONS:   Allergies as of 06/25/2016      Reactions   Codeine Anaphylaxis   Hydrocodone Anaphylaxis   Statins Other (See Comments)   Muscle spasm   Hydromorphone Other (See Comments)   Pt says he has never taken   Ivp Dye [iodinated Diagnostic Agents] Other (See Comments)   Uncontrollable sneezing, needs sedative the night before and hour and benadryl before using   Lipitor [atorvastatin Calcium] Other (See Comments)   Myalgias   Other    Hydromorphone   Seroquel [quetiapine Fumerate] Other (See Comments)   "bad trip" tongue swell      Medication List    TAKE these medications   acidophilus Caps capsule Take 2 capsules by mouth daily.   aspirin 81 MG tablet Take 81 mg by mouth at bedtime.   cholecalciferol 1000 units tablet Commonly known as:  VITAMIN D Take 1,000 Units by mouth daily.   metoprolol succinate 50 MG 24 hr tablet Commonly known as:  TOPROL-XL TAKE 1 TABLET (50 MG TOTAL) BY MOUTH DAILY. TAKE WITH OR IMMEDIATELY FOLLOWING A MEAL.   multivitamin with minerals Tabs tablet Take 1 tablet by mouth 2 (two) times daily.   omega-3 acid ethyl esters 1 g capsule Commonly  known as:  LOVAZA Take 1 g by mouth every evening.   PREVAGEN 10 MG Caps Generic drug:  Apoaequorin Take 10 mg by mouth. Reported on 08/17/2015   SPIRULINA PO Take 3 capsules by mouth daily.   tamsulosin 0.4 MG Caps capsule Commonly known as:  FLOMAX TAKE 2 CAPSULES (0.8 MG TOTAL) BY MOUTH DAILY. What changed:  when to take this   terbinafine 250 MG tablet Commonly known as:  LAMISIL Take 250 mg by mouth at bedtime.   VITAMIN D PLUS COFACTORS PO Take 1  capsule by mouth every evening.   warfarin 5 MG tablet Commonly known as:  COUMADIN Take 5-10 mg by mouth at bedtime. 10mg  on Monday, Wednesday, Friday. Take with 2mg  tab for a total 7mg  on all other days.   warfarin 2 MG tablet Commonly known as:  COUMADIN Take 2 mg by mouth as directed. Take with 5 mg tab on Tuesday, Thursday, Saturday and Sunday for a total of 7 mg.        DISCHARGE INSTRUCTIONS:  See AVS.  If you experience worsening of your admission symptoms, develop shortness of breath, life threatening emergency, suicidal or homicidal thoughts you must seek medical attention immediately by calling 911 or calling your MD immediately  if symptoms less severe.  You Must read complete instructions/literature along with all the possible adverse reactions/side effects for all the Medicines you take and that have been prescribed to you. Take any new Medicines after you have completely understood and accpet all the possible adverse reactions/side effects.   Please note  You were cared for by a hospitalist during your hospital stay. If you have any questions about your discharge medications or the care you received while you were in the hospital after you are discharged, you can call the unit and asked to speak with the hospitalist on call if the hospitalist that took care of you is not available. Once you are discharged, your primary care physician will handle any further medical issues. Please note that NO REFILLS for any discharge medications will be authorized once you are discharged, as it is imperative that you return to your primary care physician (or establish a relationship with a primary care physician if you do not have one) for your aftercare needs so that they can reassess your need for medications and monitor your lab values.    On the day of Discharge:  VITAL SIGNS:  Blood pressure 110/62, pulse (!) 57, temperature 97.9 F (36.6 C), temperature source Oral, resp. rate 18,  height 6\' 2"  (1.88 m), weight 222 lb 9.6 oz (101 kg), SpO2 94 %. PHYSICAL EXAMINATION:  GENERAL:  66 y.o.-year-old patient lying in the bed with no acute distress.  EYES: Pupils equal, round, reactive to light and accommodation. No scleral icterus. Extraocular muscles intact.  HEENT: Head atraumatic, normocephalic. Oropharynx and nasopharynx clear.  NECK:  Supple, no jugular venous distention. No thyroid enlargement, no tenderness.  LUNGS: Normal breath sounds bilaterally, no wheezing, rales,rhonchi or crepitation. No use of accessory muscles of respiration.  CARDIOVASCULAR: S1, S2 normal. No murmurs, rubs, or gallops.  ABDOMEN: Soft, non-tender, non-distended. Bowel sounds present. No organomegaly or mass.  EXTREMITIES: No pedal edema, cyanosis, or clubbing.  NEUROLOGIC: Cranial nerves II through XII are intact. Muscle strength 5/5 in all extremities. Sensation intact. Gait not checked.  PSYCHIATRIC: The patient is alert and oriented x 3.  SKIN: No obvious rash, lesion, or ulcer.  DATA REVIEW:   CBC  Recent Labs Lab  06/24/16 0522  WBC 4.5  HGB 13.3  HCT 37.5*  PLT 174    Chemistries   Recent Labs Lab 06/24/16 0522  NA 133*  K 4.5  CL 103  CO2 23  GLUCOSE 244*  BUN 23*  CREATININE 1.22  CALCIUM 9.0     Microbiology Results  Results for orders placed or performed during the hospital encounter of 03/01/10  Urine culture     Status: None   Collection Time: 03/01/10 11:55 AM  Result Value Ref Range Status   Specimen Description URINE, CLEAN CATCH  Final   Special Requests NONE  Final   Culture  Setup Time [id number]  Final   Colony Count NO GROWTH  Final   Culture NO GROWTH  Final   Report Status 03/02/2010 FINAL  Final    RADIOLOGY:  No results found.   Management plans discussed with the patient, his wife and they are in agreement.  CODE STATUS: Full Code   TOTAL TIME TAKING CARE OF THIS PATIENT: 33 minutes.    Demetrios Loll M.D on 06/25/2016 at 4:28  PM  Between 7am to 6pm - Pager - [phone]  After 6pm go to [email or web address] - Proofreader  [facility]  Office  [phone]  CC: Primary care physician; Coral Spikes, DO   Note: This dictation was prepared with Dragon dictation along with smaller phrase technology. Any transcriptional errors that result from this process are unintentional.

## 2016-06-25 NOTE — Progress Notes (Signed)
Patient off unit for cardiac testing. Will resume care upon return. Alexander Barton M Alexander Barton  

## 2016-06-25 NOTE — Progress Notes (Signed)
*  PRELIMINARY RESULTS* Echocardiogram 2D Echocardiogram has been performed.  Alexander Barton 06/25/2016, 8:48 AM

## 2016-06-25 NOTE — Care Management (Signed)
Placed in observation for progressive dyspnea  Independent in all adls, denies issues accessing medical care, obtaining medications or with transportation.  Current with  PCP.  No discharge needs identified at present by care manager or members of care team

## 2016-06-25 NOTE — Telephone Encounter (Signed)
Pt stated that he has taken Spiriva in the past and would like to have another Rx for this .  Pt is using [facility] in [place]  Pt contact [phone]

## 2016-06-25 NOTE — Discharge Instructions (Signed)
Shortness of Breath, Adult °Shortness of breath is when a person has trouble breathing enough air, or when a person feels like she or he is having trouble breathing in enough air. Shortness of breath could be a sign of medical problem. °Follow these instructions at home: °Pay attention to any changes in your symptoms. Take these actions to help with your condition: °· Do not smoke. Smoking is a common cause of shortness of breath. If you smoke and you need help quitting, ask your health care provider. °· Avoid things that can irritate your airways, such as: °¨ Mold. °¨ Dust. °¨ Air pollution. °¨ Chemical fumes. °¨ Things that can cause allergy symptoms (allergens), if you have allergies. °· Keep your living space clean and free of mold and dust. °· Rest as needed. Slowly return to your usual activities. °· Take over-the-counter and prescription medicines, including oxygen and inhaled medicines, only as told by your health care provider. °· Keep all follow-up visits as told by your health care provider. This is important. °Contact a health care provider if: °· Your condition does not improve as soon as expected. °· You have a hard time doing your normal activities, even after you rest. °· You have new symptoms. °Get help right away if: °· Your shortness of breath gets worse. °· You have shortness of breath when you are resting. °· You feel light-headed or you faint. °· You have a cough that is not controlled with medicines. °· You cough up blood. °· You have pain with breathing. °· You have pain in your chest, arms, shoulders, or abdomen. °· You have a fever. °· You cannot walk up stairs or exercise the way that you normally do. °This information is not intended to replace advice given to you by your health care provider. Make sure you discuss any questions you have with your health care provider. °Document Released: 10/15/2000 Document Revised: 08/11/2015 Document Reviewed: 06/28/2015 °Elsevier Interactive Patient  Education © 2017 Elsevier Inc. ° °

## 2016-06-25 NOTE — Progress Notes (Signed)
ANTICOAGULATION CONSULT NOTE - Initial Consult  Pharmacy Consult for warfarin dosing Indication: mechanical valve replacement  Allergies  Allergen Reactions  . Codeine Anaphylaxis  . Hydrocodone Anaphylaxis  . Statins Other (See Comments)    Muscle spasm  . Hydromorphone Other (See Comments)    Pt says he has never taken  . Ivp Dye [Iodinated Diagnostic Agents] Other (See Comments)    Uncontrollable sneezing, needs sedative the night before and hour and benadryl before using  . Lipitor [Atorvastatin Calcium] Other (See Comments)    Myalgias   . Other     Hydromorphone  . Seroquel [Quetiapine Fumerate] Other (See Comments)    "bad trip" tongue swell    Patient Measurements: Height: 6\' 2"  (188 cm) Weight: 222 lb 9.6 oz (101 kg) IBW/kg (Calculated) : 82.2 Heparin Dosing Weight:   Vital Signs: Temp: 98 F (36.7 C) (05/23 0548) Temp Source: Oral (05/23 0548) BP: 132/71 (05/23 0548) Pulse Rate: 60 (05/23 0548)  Labs:  Recent Labs  06/23/16 1646 06/23/16 2132 06/24/16 0522 06/25/16 0617  HGB 13.1  --  13.3  --   HCT 37.8*  --  37.5*  --   PLT 192  --  174  --   LABPROT  --  23.7* 22.5* 25.3*  INR  --  2.08 1.95 2.26  CREATININE 1.02  --  1.22  --   TROPONINI <0.03  --   --   --     Estimated Creatinine Clearance: 74.5 mL/min (by C-G formula based on SCr of 1.22 mg/dL).   Medical History: Past Medical History:  Diagnosis Date  . Aortic aneurysm, thoracic ([place])   . Aortic valve disease   . Arthritis   . BPH (benign prostatic hyperplasia)   . COPD (chronic obstructive pulmonary disease) ([place])   . GERD (gastroesophageal reflux disease)   . HTN (hypertension)   . Hypercholesterolemia   . Stroke ([place])   . TIA (transient ischemic attack)     Medications:  Scheduled:  . aspirin EC  81 mg Oral QHS  . docusate sodium  200 mg Oral BID  . insulin aspart  0-5 Units Subcutaneous QHS  . insulin aspart  0-9 Units Subcutaneous TID WC  . metoprolol succinate  50  mg Oral Daily  . tamsulosin  0.8 mg Oral QHS  . warfarin  7 mg Oral Q T,Th,S,Su-1800   And  . warfarin  10 mg Oral Q M,W,F-1800  . Warfarin - Pharmacist Dosing Inpatient   Does not apply q1800    Assessment: Pt. Admitted for SOB and is being anticoagulated w/ warfarin for mechanical valve replacement. Per Pt. And wife warfarin home dose: MWF: warfarin 10 mg TThSaSun: Warfarin 7 mg  5/22 INR: 1.95; warfarin 10 mg  5/23  INR: 2.6; warfarin 10 mg ( home dose)   Goal of Therapy:  INR 2.5 - 3.0 (per anticoag clinic visit) Monitor platelets by anticoagulation protocol: Yes   Plan:  Will  Continue home regimen of warfarin 10 mg MWF and 7 mg Tue, Thurs, Sat, and Sun. Will order INR with am labs.

## 2016-06-25 NOTE — Progress Notes (Signed)
    Echo pending. He is for [employer] this morning to evaluate for high-risk ischemia. INR 2.26.

## 2016-06-25 NOTE — Telephone Encounter (Signed)
Rx sent 

## 2016-06-26 LAB — ECHOCARDIOGRAM COMPLETE
Height: 74 in
Weight: 3561.6 oz

## 2016-06-26 LAB — PROTIME-INR

## 2016-06-27 ENCOUNTER — Telehealth: Payer: Self-pay | Admitting: Family Medicine

## 2016-06-27 NOTE — Telephone Encounter (Signed)
Transition Care Management Follow-up Telephone Call  How have you been since you were released from the hospital? SOB has been better since discharge.   Do you understand why you were in the hospital? Yes   Do you understand the discharge instrcutions? Yes  Items Reviewed:  Medications reviewed: yes  Allergies reviewed: yes  Dietary changes reviewed: yes  Referrals reviewed: yes   Functional Questionnaire:  Activities of Daily Living (ADLs):   He states they are independent in the following: Independent in ADLs. States they require assistance with the following: No assist needed   Any transportation issues/concerns?:No   Any patient concerns? Yes, concerned that SOB with activity has developed because of inactivity with lifestyle.   Confirmed importance and date/time of follow-up visits scheduled:Yes   Confirmed with patient if condition begins to worsen call PCP or go to the ER.  Patient was given the Call-a-Nurse line [phone]: Yes

## 2016-07-01 ENCOUNTER — Ambulatory Visit (INDEPENDENT_AMBULATORY_CARE_PROVIDER_SITE_OTHER): Payer: Medicare Other | Admitting: Pharmacist

## 2016-07-01 DIAGNOSIS — Z7901 Long term (current) use of anticoagulants: Secondary | ICD-10-CM

## 2016-07-01 DIAGNOSIS — I359 Nonrheumatic aortic valve disorder, unspecified: Secondary | ICD-10-CM

## 2016-07-02 ENCOUNTER — Ambulatory Visit (INDEPENDENT_AMBULATORY_CARE_PROVIDER_SITE_OTHER): Payer: Medicare Other | Admitting: Family Medicine

## 2016-07-02 ENCOUNTER — Encounter: Payer: Self-pay | Admitting: Family Medicine

## 2016-07-02 DIAGNOSIS — R0609 Other forms of dyspnea: Secondary | ICD-10-CM

## 2016-07-02 DIAGNOSIS — R0600 Dyspnea, unspecified: Secondary | ICD-10-CM

## 2016-07-02 NOTE — Patient Instructions (Signed)
We will arrange the pulmonary consultation.  Slow increase in exercise.  Follow up in 3-6 months.   Take care  Dr. Lacinda Axon

## 2016-07-02 NOTE — Progress Notes (Signed)
Subjective:  Patient ID: Alexander Barton, male    DOB: [birth year]  Age: 66 y.o. MRN: [id number]  CC: Hospital follow up  HPI:  66 year old male with HTN, aortic valve disease s/p replacement, hx of TIA and stroke, HLD, COPD presents for hospital follow up.  Patient recent admitted from 5/21 - 5/23. Hospital course reviewed and summarized below.  He presented with progressive dyspnea on exertion.  CT chest negative. Seen by cardiology. Had Echo and stress test which were unrevealing.  He was discharge home and instructed to follow up. No medication changes were made. Patient's medications reviewed and reconciled today (he informed me that he has stopped crestor due to knee pain). He reports continued SOB with minimal exertion (just a few steps). Has a remote history of COPD. No chest pain, no palpitations. He is quite concerned about his SOB. Minimal to essentially no physical activity. No other complaints or concerns at this time.   Social Hx   Social History   Social History  . Marital status: Married    Spouse name: N/A  . Number of children: 2  . Years of education: N/A   Occupational History  . SALES [employer]   Social History Main Topics  . Smoking status: Never Smoker  . Smokeless tobacco: Never Used  . Alcohol use No  . Drug use: No  . Sexual activity: Not Currently   Other Topics Concern  . 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   One son and one daughter   He lives in [place]   One caffeinated beverage daily   08/30/2014       Review of Systems  Respiratory: Positive for shortness of breath.   Cardiovascular: Negative.     Objective:  BP 114/68 (BP Location: Left Arm, Patient Position: Sitting, Cuff Size: Large)   Pulse (!) 57   Temp 98.4 F (36.9 C) (Oral)   Resp 18   Wt 231 lb 2 oz (104.8 kg)   SpO2 97%   BMI 29.67 kg/m   BP/Weight 07/02/2016  06/25/2016 3/76/2831  Systolic BP 517 616 -  Diastolic BP 68 62 -  Wt. (Lbs) 231.13 222.6 -  BMI 29.67 - 28.58    Physical Exam  Constitutional: He is oriented to person, place, and time. He appears well-developed. No distress.  Cardiovascular: Normal rate and regular rhythm.   2/6 systolic murmur. Loud S2 from mechanical valve.   Pulmonary/Chest: Effort normal. He has no wheezes. He has no rales.  Neurological: He is alert and oriented to person, place, and time.  Psychiatric: He has a normal mood and affect.  Vitals reviewed.   Lab Results  Component Value Date   WBC 4.5 06/24/2016   HGB 13.3 06/24/2016   HCT 37.5 (L) 06/24/2016   PLT 174 06/24/2016   GLUCOSE 244 (H) 06/24/2016   CHOL 174 10/03/2015   TRIG 195.0 (H) 10/03/2015   HDL 47.20 10/03/2015   LDLDIRECT 172.0 11/14/2014   LDLCALC 87 10/03/2015   ALT 23 10/03/2015   AST 22 10/03/2015   NA 133 (L) 06/24/2016   K 4.5 06/24/2016   CL 103 06/24/2016   CREATININE 1.22 06/24/2016   BUN 23 (H) 06/24/2016   CO2 23 06/24/2016   TSH 1.78 12/10/2010   PSA 0.34 11/14/2014   INR 2.26 06/25/2016   HGBA1C 6.2 10/03/2015  Assessment & Plan:   Problem List Items Addressed This Visit      Other   Dyspnea on exertion    New problem. Work up unrevealing.  Meds reviewed and reconciled today. Likely multifactorial. Arranging pulmonary consultation.       Relevant Orders   Ambulatory referral to Pulmonology      Meds ordered this encounter  Medications  . DISCONTD: rosuvastatin (CRESTOR) 10 MG tablet    Sig: Take by mouth.    Follow-up: 3-6 months  [place] DO [facility]

## 2016-07-02 NOTE — Assessment & Plan Note (Signed)
New problem. Work up unrevealing.  Meds reviewed and reconciled today. Likely multifactorial. Arranging pulmonary consultation.

## 2016-07-07 ENCOUNTER — Institutional Professional Consult (permissible substitution): Payer: Medicare Other | Admitting: Internal Medicine

## 2016-07-15 ENCOUNTER — Ambulatory Visit (INDEPENDENT_AMBULATORY_CARE_PROVIDER_SITE_OTHER): Payer: Medicare Other | Admitting: Pharmacist Clinician (PhC)/ Clinical Pharmacy Specialist

## 2016-07-15 DIAGNOSIS — I359 Nonrheumatic aortic valve disorder, unspecified: Secondary | ICD-10-CM

## 2016-07-15 DIAGNOSIS — Z7901 Long term (current) use of anticoagulants: Secondary | ICD-10-CM

## 2016-07-15 LAB — POCT INR: INR: 2.1

## 2016-07-17 ENCOUNTER — Other Ambulatory Visit: Payer: Self-pay | Admitting: Cardiology

## 2016-07-17 ENCOUNTER — Telehealth: Payer: Self-pay | Admitting: Pharmacist

## 2016-07-17 NOTE — Telephone Encounter (Signed)
Patient to had periodontal procedure next week on 1 tooth @ [facility] [phone]).   No need to hold ASA or warfarin for laser procedure. May need additional procedure int he future and will re-assess need to hold anticoagulation at that time.

## 2016-07-22 ENCOUNTER — Ambulatory Visit (INDEPENDENT_AMBULATORY_CARE_PROVIDER_SITE_OTHER): Payer: Medicare Other | Admitting: Internal Medicine

## 2016-07-22 ENCOUNTER — Encounter: Payer: Self-pay | Admitting: Internal Medicine

## 2016-07-22 VITALS — BP 128/68 | HR 76 | Ht 74.0 in | Wt 232.0 lb

## 2016-07-22 DIAGNOSIS — R0600 Dyspnea, unspecified: Secondary | ICD-10-CM | POA: Diagnosis not present

## 2016-07-22 DIAGNOSIS — J449 Chronic obstructive pulmonary disease, unspecified: Secondary | ICD-10-CM | POA: Diagnosis not present

## 2016-07-22 MED ORDER — ALBUTEROL SULFATE HFA 108 (90 BASE) MCG/ACT IN AERS: 2.0000 | INHALATION_SPRAY | RESPIRATORY_TRACT | 3 refills | Status: DC | PRN

## 2016-07-22 MED ORDER — ALBUTEROL SULFATE HFA 108 (90 BASE) MCG/ACT IN AERS: 2.0000 | INHALATION_SPRAY | RESPIRATORY_TRACT | 2 refills | Status: DC | PRN

## 2016-07-22 MED ORDER — BUDESONIDE-FORMOTEROL FUMARATE 160-4.5 MCG/ACT IN AERO: 2.0000 | INHALATION_SPRAY | Freq: Two times a day (BID) | RESPIRATORY_TRACT | 12 refills | Status: DC

## 2016-07-22 MED ORDER — TIOTROPIUM BROMIDE MONOHYDRATE 2.5 MCG/ACT IN AERS: 2.5000 ug | INHALATION_SPRAY | Freq: Two times a day (BID) | RESPIRATORY_TRACT | 12 refills | Status: DC

## 2016-07-22 NOTE — Addendum Note (Signed)
Addended by: Oscar La R on: 07/22/2016 03:34 PM   Modules accepted: Orders

## 2016-07-22 NOTE — Patient Instructions (Signed)
Start Spiriva 2.5 Start Symbicort 160 Albuterol as needed Check 6MWT Check ONO pulm Rehab referral

## 2016-07-22 NOTE — Progress Notes (Signed)
Name: HAMAD WHYTE MRN: [id number] DOB: [birth year]     CONSULTATION DATE: 07/22/2016  REFERRING MD : Dr. Lacinda Axon  CHIEF COMPLAINT: Shortness of breath   Office spirometry on July 22, 2016 FEV1 FEC ratio is 60 since her son predicted FEV1 is 2.1 L with 56% predicted FEC is 3.3 L with 63% predicted Taste on office spirometry patient has moderate to severe obstructive airways disease with restrictive lung disease likely related to obesity  HISTORY OF PRESENT ILLNESS:   66 year old pleasant white male seen today for chronic shortness of breath Patient has had shortness of breath for many years  It has progressively worsening over the last several months Going up a flight of stairs bending over and tying shoes worsens his shortness of breath  Patient was recently admitted to the hospital for COPD exacerbation Was treated with IV steroids antibiotics and oxygen therapy  Patient has had mechanical valve surgery in the past and is on Coumadin therapy since 2008  Patient denies cough + shortness of breath + short dyspnea on exertion + Intermittent wheezing    Patient is a nonsmoker however was exposed to secondhand next smoke exposure for approximately 20 years Patient is retired but has worked in the [employer] for approximately 20 years and was exposed to dust  Patient was started on Spiriva and states that it does help his breathing but previously it has known to cause constipation Patient has been gaining weight and  has no exercise capacity at this time  There is no evidence of infection at this time No evidence of CHF at this time  PAST MEDICAL HISTORY :   has a past medical history of Aortic aneurysm, thoracic ([place]); Aortic valve disease; Arthritis; BPH (benign prostatic hyperplasia); COPD (chronic obstructive pulmonary disease) ([facility]); GERD (gastroesophageal reflux disease); HTN (hypertension); Hypercholesterolemia; Stroke [facility]); and TIA (transient ischemic attack).  has a past surgical history that includes Aortic valve replacement; Back surgery; Foot surgery; Achilles tendon repair; and Ascending aortic aneurysm repair. Prior to Admission medications   Medication Sig Start Date End Date Taking? Authorizing Provider  acidophilus (RISAQUAD) CAPS capsule Take 2 capsules by mouth daily.   Yes [provider]  Apoaequorin (PREVAGEN) 10 MG CAPS Take 10 mg by mouth. Reported on 08/17/2015   Yes [provider]  aspirin 81 MG tablet Take 81 mg by mouth at bedtime.    Yes [provider]  cholecalciferol (VITAMIN D) 1000 units tablet Take 1,000 Units by mouth daily.   Yes [provider]  metoprolol succinate (TOPROL-XL) 50 MG 24 hr tablet TAKE 1 TABLET (50 MG TOTAL) BY MOUTH DAILY. TAKE WITH OR IMMEDIATELY FOLLOWING A MEAL. 06/04/16  Yes Lacinda Axon, Jayce G, DO  Multiple Vitamin (MULTIVITAMIN WITH MINERALS) TABS tablet Take 1 tablet by mouth 2 (two) times daily.   Yes [provider]  Nutritional Supplements (VITAMIN D PLUS COFACTORS PO) Take 1 capsule by mouth every evening.   Yes [provider]  omega-3 acid ethyl esters (LOVAZA) 1 g capsule Take 1 g by mouth every evening.   Yes [provider]  SPIRULINA PO Take 3 capsules by mouth daily.   Yes [provider]  tamsulosin (FLOMAX) 0.4 MG CAPS capsule TAKE 2 CAPSULES (0.8 MG TOTAL) BY MOUTH DAILY. Patient taking differently: Take 0.8 mg by mouth at bedtime.  11/05/15  Yes Cook, Jayce G, DO  terbinafine (LAMISIL) 250 MG tablet Take 250 mg by mouth at bedtime.   Yes [provider]  tiotropium (SPIRIVA) 18 MCG inhalation capsule Place 1 capsule (18 mcg total) into inhaler and inhale daily. 06/25/16  Yes Cook, Jayce G, DO  warfarin (COUMADIN) 2 MG tablet TAKE 1 TABLET WITH 5 MG TABLET DAILY AS DIRECTED BY COUMADIN CLINIC 07/18/16  Yes [name], [name] M, MD  warfarin (COUMADIN) 5 MG tablet Take 5-10 mg by mouth at bedtime. 10mg  on Monday, Wednesday,  Friday. Take with 2mg  tab for a total 7mg  on all other days.   Yes [provider]   Allergies  Allergen Reactions  . Codeine Anaphylaxis  . Hydrocodone Anaphylaxis  . Statins Other (See Comments)    Muscle spasm  . Hydromorphone Other (See Comments)    Pt says he has never taken  . Ivp Dye [Iodinated Diagnostic Agents] Other (See Comments)    Uncontrollable sneezing, needs sedative the night before and hour and benadryl before using  . Lipitor [Atorvastatin Calcium] Other (See Comments)    Myalgias   . Other     Hydromorphone  . Seroquel [Quetiapine Fumerate] Other (See Comments)    "bad trip" tongue swell    FAMILY HISTORY:  family history includes Alzheimer's disease in his father; Diabetes in his father, mother, and sister; Hypertension in his brother and sister. SOCIAL HISTORY:  reports that he has never smoked. He has never used smokeless tobacco. He reports that he does not drink alcohol or use drugs.  REVIEW OF SYSTEMS:   Constitutional: Negative for fever, chills, weight loss, malaise/fatigue and diaphoresis.  HENT: Negative for hearing loss, ear pain, nosebleeds, congestion, sore throat, neck pain, tinnitus and ear discharge.   Eyes: Negative for blurred vision, double vision, photophobia, pain, discharge and redness.  Respiratory: Negative for cough, hemoptysis, sputum production, shortness of breath, wheezing and stridor.   Cardiovascular: Negative for chest pain, palpitations, orthopnea, claudication, leg swelling and PND.  Gastrointestinal: Negative for heartburn, nausea, vomiting, abdominal pain, diarrhea, constipation, blood in stool and melena.  Genitourinary: Negative for dysuria, urgency, frequency, hematuria and flank pain.  Musculoskeletal: Negative for myalgias, back pain, joint pain and falls.  Skin: Negative for itching and rash.  Neurological: Negative for dizziness, tingling, tremors, sensory change, speech change, focal weakness, seizures, loss  of consciousness, weakness and headaches.  Endo/Heme/Allergies: Negative for environmental allergies and polydipsia. Does not bruise/bleed easily.  ALL OTHER ROS ARE NEGATIVE   BP 128/68 (BP Location: Left Arm, Cuff Size: Normal)   Pulse 76   Ht 6\' 2"  (1.88 m)   Wt 232 lb (105.2 kg)   SpO2 96%   BMI 29.79 kg/m    Physical Examination:   GENERAL:NAD, no fevers, chills, no weakness no fatigue HEAD: Normocephalic, atraumatic.  EYES: Pupils equal, round, reactive to light. Extraocular muscles intact. No scleral icterus.  MOUTH: Moist mucosal membrane.   EAR, NOSE, THROAT: Clear without exudates. No external lesions.  NECK: Supple. No thyromegaly. No nodules. No JVD.  PULMONARY:CTA B/L no wheezes, no crackles, no rhonchi CARDIOVASCULAR: S1 and S2. Regular rate and rhythm. No murmurs, rubs, or gallops. No edema.  GASTROINTESTINAL: Soft, nontender, nondistended. No masses. Positive bowel sounds.  MUSCULOSKELETAL: No swelling, clubbing, or edema. Range of motion full in all extremities.  NEUROLOGIC: Cranial nerves II through XII are intact. No gross focal neurological deficits.  SKIN: No ulceration, lesions, rashes, or cyanosis. Skin warm and dry. Turgor intact.  PSYCHIATRIC: Mood, affect within normal limits. The patient is awake, alert and oriented x 3. Insight, judgment intact.    CT of the chest May  2018 I have Independently reviewed images of  Ct chest  on 07/22/2016 Interpretation: No evidence of pneumonia and no evidence of CHF or evidence of effusions no masses noted   ASSESSMENT / PLAN: 66 year old pleasant white male seen today for progressive shortness of breath and dyspnea on exertion findings  suggest moderate to severe COPD Gold stage D with FEV1 56% predicted in the setting of mechanical valve on chronic Coumadin therapy in the setting of morbid obesity and deconditioned state  #1 shortness breath and dyspnea on exertion progressively worsening Multifactorial in nature  related to COPD as well as cardiac disease and deconditioned state Patient will need further workup for hypoxia Recommend checking 6 minute walk test Will also check overnight pulse oximetry  #2 moderate to severe COPD based on pulmonary function testing with FEV1 of 56% predicted start Spiriva RESPIMAT therapy 2.5 twice daily Start Symbicort 160 twice daily Albuterol as needed  #3 Obesity -recommend significant weight loss -recommend changing diet  #4 Deconditioned state -Recommend increased daily activity and exercise   #5 history of mechanical valve Follow-up cardiology as recommended  Patient/Family are satisfied with Plan of action and management. All questions answered Follow-up in 3 months for respiratory assessment and after tests completed  Corrin Parker, M.D.  [name]  Medical Director [place] Director [facility] Cardio-Pulmonary Department

## 2016-07-23 ENCOUNTER — Other Ambulatory Visit: Payer: Self-pay | Admitting: Internal Medicine

## 2016-07-23 ENCOUNTER — Telehealth: Payer: Self-pay | Admitting: Internal Medicine

## 2016-07-23 DIAGNOSIS — J449 Chronic obstructive pulmonary disease, unspecified: Secondary | ICD-10-CM

## 2016-07-23 NOTE — Progress Notes (Signed)
New order entered

## 2016-07-23 NOTE — Telephone Encounter (Signed)
Alexander Barton from [facility] calling needing new order for Pulse ox and how it is to be used Please call back to : [phone]

## 2016-07-29 LAB — POCT INR: INR: 2

## 2016-07-30 ENCOUNTER — Ambulatory Visit (INDEPENDENT_AMBULATORY_CARE_PROVIDER_SITE_OTHER): Payer: Medicare Other | Admitting: Pharmacist Clinician (PhC)/ Clinical Pharmacy Specialist

## 2016-07-30 DIAGNOSIS — Z7901 Long term (current) use of anticoagulants: Secondary | ICD-10-CM

## 2016-07-30 DIAGNOSIS — I359 Nonrheumatic aortic valve disorder, unspecified: Secondary | ICD-10-CM

## 2016-08-05 ENCOUNTER — Ambulatory Visit (INDEPENDENT_AMBULATORY_CARE_PROVIDER_SITE_OTHER): Payer: Medicare Other | Admitting: Pharmacist

## 2016-08-05 DIAGNOSIS — Z7901 Long term (current) use of anticoagulants: Secondary | ICD-10-CM

## 2016-08-05 DIAGNOSIS — I359 Nonrheumatic aortic valve disorder, unspecified: Secondary | ICD-10-CM

## 2016-08-05 LAB — PROTIME-INR: INR: 4.1 — AB (ref 0.9–1.1)

## 2016-08-08 ENCOUNTER — Ambulatory Visit: Payer: Medicare Other

## 2016-08-12 ENCOUNTER — Encounter: Payer: Self-pay | Admitting: *Deleted

## 2016-08-12 ENCOUNTER — Ambulatory Visit (INDEPENDENT_AMBULATORY_CARE_PROVIDER_SITE_OTHER): Payer: Medicare Other | Admitting: Pharmacist

## 2016-08-12 ENCOUNTER — Encounter: Payer: Medicare Other | Attending: Internal Medicine | Admitting: *Deleted

## 2016-08-12 VITALS — Ht 73.0 in | Wt 227.2 lb

## 2016-08-12 DIAGNOSIS — J449 Chronic obstructive pulmonary disease, unspecified: Secondary | ICD-10-CM | POA: Diagnosis present

## 2016-08-12 DIAGNOSIS — I359 Nonrheumatic aortic valve disorder, unspecified: Secondary | ICD-10-CM

## 2016-08-12 DIAGNOSIS — R0600 Dyspnea, unspecified: Secondary | ICD-10-CM

## 2016-08-12 DIAGNOSIS — Z7901 Long term (current) use of anticoagulants: Secondary | ICD-10-CM

## 2016-08-12 LAB — POCT INR: INR: 2.2

## 2016-08-12 NOTE — Progress Notes (Signed)
Pulmonary Individual Treatment Plan  Patient Details  Name: Alexander Barton MRN: [id number] Date of Birth: 1950/03/17 Referring Provider:    Initial Encounter Date:    Pulmonary Rehab from 08/12/2016 in [facility]  Date  08/12/16      Visit Diagnosis: Dyspnea, unspecified type  Chronic obstructive pulmonary disease, unspecified COPD type ([place])  Patient's Home Medications on Admission:  Current Outpatient Prescriptions:  .  acidophilus (RISAQUAD) CAPS capsule, Take 2 capsules by mouth daily., Disp: , Rfl:  .  albuterol (PROVENTIL HFA;VENTOLIN HFA) 108 (90 Base) MCG/ACT inhaler, Inhale 2 puffs into the lungs every 4 (four) hours as needed for wheezing or shortness of breath., Disp: 1 Inhaler, Rfl: 3 .  Apoaequorin (PREVAGEN) 10 MG CAPS, Take 10 mg by mouth. Reported on 08/17/2015, Disp: , Rfl:  .  aspirin 81 MG tablet, Take 81 mg by mouth at bedtime. , Disp: , Rfl:  .  budesonide-formoterol (SYMBICORT) 160-4.5 MCG/ACT inhaler, Inhale 2 puffs into the lungs 2 (two) times daily., Disp: 1 Inhaler, Rfl: 12 .  cholecalciferol (VITAMIN D) 1000 units tablet, Take 1,000 Units by mouth daily., Disp: , Rfl:  .  metoprolol succinate (TOPROL-XL) 50 MG 24 hr tablet, TAKE 1 TABLET (50 MG TOTAL) BY MOUTH DAILY. TAKE WITH OR IMMEDIATELY FOLLOWING A MEAL., Disp: 90 tablet, Rfl: 1 .  Multiple Vitamin (MULTIVITAMIN WITH MINERALS) TABS tablet, Take 1 tablet by mouth 2 (two) times daily., Disp: , Rfl:  .  Nutritional Supplements (VITAMIN D PLUS COFACTORS PO), Take 1 capsule by mouth every evening., Disp: , Rfl:  .  omega-3 acid ethyl esters (LOVAZA) 1 g capsule, Take 1 g by mouth every evening., Disp: , Rfl:  .  SPIRULINA PO, Take 3 capsules by mouth daily., Disp: , Rfl:  .  tamsulosin (FLOMAX) 0.4 MG CAPS capsule, TAKE 2 CAPSULES (0.8 MG TOTAL) BY MOUTH DAILY. (Patient taking differently: Take 0.8 mg by mouth at bedtime. ), Disp: 180 capsule, Rfl: 3 .  terbinafine (LAMISIL) 250 MG  tablet, Take 250 mg by mouth at bedtime., Disp: , Rfl:  .  Tiotropium Bromide Monohydrate (SPIRIVA RESPIMAT) 2.5 MCG/ACT AERS, Inhale 2.5 mcg into the lungs 2 (two) times daily., Disp: 1 Inhaler, Rfl: 12 .  warfarin (COUMADIN) 2 MG tablet, TAKE 1 TABLET WITH 5 MG TABLET DAILY AS DIRECTED BY COUMADIN CLINIC, Disp: 90 tablet, Rfl: 0 .  warfarin (COUMADIN) 5 MG tablet, Take 5-10 mg by mouth at bedtime. 10mg  on Monday, Wednesday, Friday. Take with 2mg  tab for a total 7mg  on all other days., Disp: , Rfl:  .  tiotropium (SPIRIVA) 18 MCG inhalation capsule, Place 1 capsule (18 mcg total) into inhaler and inhale daily. (Patient not taking: Reported on 08/12/2016), Disp: 30 capsule, Rfl: 12  Past Medical History: Past Medical History:  Diagnosis Date  . Aortic aneurysm, thoracic ([place])   . Aortic valve disease   . Arthritis   . BPH (benign prostatic hyperplasia)   . COPD (chronic obstructive pulmonary disease) ([place])   . GERD (gastroesophageal reflux disease)   . HTN (hypertension)   . Hypercholesterolemia   . Stroke ([place])   . TIA (transient ischemic attack)     Tobacco Use: History  Smoking Status  . Never Smoker  Smokeless Tobacco  . Never Used    Labs: Recent Review Flowsheet Data    Labs for ITP Cardiac and Pulmonary Rehab Latest Ref Rng & Units 03/01/2010 03/02/2010 06/04/2012 11/14/2014 10/03/2015   Cholestrol 0 - 200  mg/dL - 118 ATP III CLASSIFICATION: <200     mg/dL   Desirable 200-239  mg/dL   Borderline High >=240    mg/dL   High 247(H) 254(H) 174   LDLCALC 0 - 99 mg/dL - 67 Total Cholesterol/HDL:CHD Risk Coronary Heart Disease Risk Table Men   Women 1/2 Average Risk   3.4   3.3 Average Risk       5.0   4.4 2 X Average Risk   9.6   7.1 3 X Average Risk  23.4   11.0 Use the calculated Patient Ratio above and the CHD Risk Table to determine the patient's CHD Risk. ATP III CLASSIFICATION (LDL): <100     mg/dL   Optimal 100-129  mg/dL   Near or Above Optimal 130-159  mg/dL    Borderline 160-189  mg/dL   High >190     mg/dL   Very High 169(H) - 87   LDLDIRECT mg/dL - - - 172.0 -   HDL >39.00 mg/dL - 21(L) 45 48.50 47.20   Trlycerides 0.0 - 149.0 mg/dL - 152(H) 165(H) 202.0(H) 195.0(H)   Hemoglobin A1c 4.6 - 6.5 % 6.2 (NOTE)                                                                       According to the ADA Clinical Practice Recommendations for 2011, when HbA1c is used as a screening test:   >=6.5%   Diagnostic of Diabetes Mellitus           (if abnormal result is confirmed)  5.7-6.4%   Increased risk of developing Diabetes Mellitus  References:Diagnosis and Classification of Diabetes Mellitus,Diabetes WRUE,4540,98(JXBJY 1):S62-S69 and Standards of Medical Care in         Diabetes - 2011,Diabetes Care,2011,34 (Suppl 1):S11-S61.(H) - 5.9(H) - 6.2       ADL [facility]:     Pulmonary Assessment Scores    Row Name 08/12/16 1233 08/12/16 1527       ADL [facility]   ADL Phase Entry  -    SOB Score total 63  -    Rest 2  -    Walk 3  -    Stairs 5  -    Bath 3  -    Dress 2  -    Shop 2  -      mMRC Score   mMRC Score  - 1       Pulmonary Function Assessment:     Pulmonary Function Assessment - 08/12/16 1238      Initial Spirometry Results   FVC% 62 %   FEV1% 73 %   FEV1/FVC Ratio 74     Post Bronchodilator Spirometry Results   FVC% 71 %   FEV1% 76 %   FEV1/FVC Ratio 81      Exercise Target Goals: Date: 08/12/16  Exercise Program Goal: Individual exercise prescription set with THRR, safety & activity barriers. Participant demonstrates ability to understand and report RPE using BORG scale, to self-measure pulse accurately, and to acknowledge the importance of the exercise prescription.  Exercise Prescription Goal: Starting with aerobic activity 30 plus minutes a day, 3 days per week for initial exercise prescription. Provide home exercise prescription and guidelines that participant  acknowledges understanding prior to discharge.  Activity  Barriers & Risk Stratification:   6 Minute Walk:     6 Minute Walk    Row Name 08/12/16 1313 08/12/16 1526       6 Minute Walk   Phase Initial Initial    Distance 1400 feet 1400 feet    Walk Time 6 minutes 6 minutes    # of Rest Breaks 0  -    MPH 2.65  -    METS 3.03  -    RPE 14 14    Perceived Dyspnea  2.5 2.5    VO2 Peak 10.61  -    Symptoms No No    Resting HR 58 bpm 58 bpm    Resting BP 106/64 106/64    Max Ex. HR 95 bpm 95 bpm    Max Ex. BP 120/58 120/58      Interval HR   Baseline HR 58  -    1 Minute HR 64  -    2 Minute HR 68  -    3 Minute HR 68  -    5 Minute HR 95  -    6 Minute HR 93  -    Interval Heart Rate? Yes  -      Interval Oxygen   Interval Oxygen? Yes  -    Baseline Oxygen Saturation % 96 %  -    Baseline Liters of Oxygen 0 L  -    1 Minute Oxygen Saturation % 80 %  -    1 Minute Liters of Oxygen 0 L  -    2 Minute Oxygen Saturation % 80 %  -    2 Minute Liters of Oxygen 0 L  -    3 Minute Oxygen Saturation % 83 %  -    3 Minute Liters of Oxygen 0 L  -    4 Minute Liters of Oxygen 0 L  -    5 Minute Oxygen Saturation % 97 %  -    5 Minute Liters of Oxygen 0 L  -    6 Minute Oxygen Saturation % 100 %  -    6 Minute Liters of Oxygen 0 L  -    2 Minute Post Liters of Oxygen 0 L  -      Oxygen Initial Assessment:     Oxygen Initial Assessment - 08/12/16 1213      Home Oxygen   Home Oxygen Device None   Sleep Oxygen Prescription None   Home Exercise Oxygen Prescription None   Home at Rest Exercise Oxygen Prescription None      Oxygen Re-Evaluation:   Oxygen Discharge (Final Oxygen Re-Evaluation):   Initial Exercise Prescription:     Initial Exercise Prescription - 08/12/16 1300      Date of Initial Exercise RX and Referring Provider   Date 08/12/16     Treadmill   MPH 2.5   Grade 0.5   Minutes 15   METs 2.9     Recumbant Bike   Level 2   RPM 60   Watts 30   Minutes 15   METs 2.9     T5 Nustep   Level 2    SPM 80   Minutes 15   METs 2.9     Prescription Details   Frequency (times per week) 3   Duration Progress to 45 minutes of aerobic exercise without signs/symptoms of physical distress  Intensity   THRR 40-80% of Max Heartrate 96-134   Ratings of Perceived Exertion 11-15   Perceived Dyspnea 0-4     Resistance Training   Training Prescription Yes   Weight 3 lb   Reps 10-15      Perform Capillary Blood Glucose checks as needed.  Exercise Prescription Changes:   Exercise Comments:   Exercise Goals and Review:     Exercise Goals    Row Name 08/12/16 1313             Exercise Goals   Increase Physical Activity Yes       Intervention Provide advice, education, support and counseling about physical activity/exercise needs.;Develop an individualized exercise prescription for aerobic and resistive training based on initial evaluation findings, risk stratification, comorbidities and participant's personal goals.       Expected Outcomes Achievement of increased cardiorespiratory fitness and enhanced flexibility, muscular endurance and strength shown through measurements of functional capacity and personal statement of participant.       Increase Strength and Stamina Yes       Intervention Provide advice, education, support and counseling about physical activity/exercise needs.;Develop an individualized exercise prescription for aerobic and resistive training based on initial evaluation findings, risk stratification, comorbidities and participant's personal goals.       Expected Outcomes Achievement of increased cardiorespiratory fitness and enhanced flexibility, muscular endurance and strength shown through measurements of functional capacity and personal statement of participant.          Exercise Goals Re-Evaluation :   Discharge Exercise Prescription (Final Exercise Prescription Changes):   Nutrition:  Target Goals: Understanding of nutrition guidelines, daily intake  of sodium 1500mg , cholesterol 200mg , calories 30% from fat and 7% or less from saturated fats, daily to have 5 or more servings of fruits and vegetables.  Biometrics:     Pre Biometrics - 08/12/16 1312      Pre Biometrics   Height 6\' 1"  (1.854 m)   Weight 227 lb 3.2 oz (103.1 kg)   Waist Circumference 46.5 inches   Hip Circumference 45 inches   Waist to Hip Ratio 1.03 %   BMI (Calculated) 30       Nutrition Therapy Plan and Nutrition Goals:     Nutrition Therapy & Goals - 08/12/16 1222      Intervention Plan   Intervention Prescribe, educate and counsel regarding individualized specific dietary modifications aiming towards targeted core components such as weight, hypertension, lipid management, diabetes, heart failure and other comorbidities.   Expected Outcomes Short Term Goal: Understand basic principles of dietary content, such as calories, fat, sodium, cholesterol and nutrients.;Short Term Goal: A plan has been developed with personal nutrition goals set during dietitian appointment.;Long Term Goal: Adherence to prescribed nutrition plan.      Nutrition Discharge: Rate Your Plate Scores:   Nutrition Goals Re-Evaluation:   Nutrition Goals Discharge (Final Nutrition Goals Re-Evaluation):   Psychosocial: Target Goals: Acknowledge presence or absence of significant depression and/or stress, maximize coping skills, provide positive support system. Participant is able to verbalize types and ability to use techniques and skills needed for reducing stress and depression.   Initial Review & Psychosocial Screening:     Initial Psych Review & Screening - 08/12/16 1225      Initial Review   Current issues with History of Depression;Current Sleep Concerns  Over 20 years ago with depression history.  Sleep up several times a night, sometimes hard to get back to sleep.  Will use over the  counter aid about 1-2 times a month to get back to sleep.      Family Dynamics   Good  Support System? Yes  WOnderful wife of almost 33 years, children     Barriers   Psychosocial barriers to participate in program There are no identifiable barriers or psychosocial needs.;The patient should benefit from training in stress management and relaxation.     Screening Interventions   Interventions Encouraged to exercise;To provide support and resources with identified psychosocial needs      Quality of Life Scores:     Quality of Life - 08/12/16 1230      Quality of Life Scores   Health/Function Pre 19.2 %   Socioeconomic Pre 26.79 %   Psych/Spiritual Pre 27.86 %   Family Pre 30 %   GLOBAL Pre 24.13 %      PHQ-9: Recent Review Flowsheet Data    Depression screen [facility] 2/9 08/12/2016 12/05/2015   Decreased Interest 0 0   Down, Depressed, Hopeless 0 0   PHQ - 2 Score 0 0   Altered sleeping 0 -   Tired, decreased energy 2 -   Change in appetite 0 -   Feeling bad or failure about yourself  0 -   Trouble concentrating 0 -   Moving slowly or fidgety/restless 0 -   Suicidal thoughts 0 -   PHQ-9 Score 2 -   Difficult doing work/chores Not difficult at all -     Interpretation of Total Score  Total Score Depression Severity:  1-4 = Minimal depression, 5-9 = Mild depression, 10-14 = Moderate depression, 15-19 = Moderately severe depression, 20-27 = Severe depression   Psychosocial Evaluation and Intervention:   Psychosocial Re-Evaluation:   Psychosocial Discharge (Final Psychosocial Re-Evaluation):   Education: Education Goals: Education classes will be provided on a weekly basis, covering required topics. Participant will state understanding/return demonstration of topics presented.  Learning Barriers/Preferences:     Learning Barriers/Preferences - 08/12/16 1232      Learning Barriers/Preferences   Learning Barriers None   Learning Preferences None      Education Topics: Initial Evaluation Education: - Verbal, written and demonstration of  respiratory meds, RPE/PD scales, oximetry and breathing techniques. Instruction on use of nebulizers and MDIs: cleaning and proper use, rinsing mouth with steroid doses and importance of monitoring MDI activations.   Pulmonary Rehab from 08/12/2016 in [facility]  Date  08/12/16  Educator  SB  Instruction Review Code  2- meets goals/outcomes      General Nutrition Guidelines/Fats and Fiber: -Group instruction provided by verbal, written material, models and posters to present the general guidelines for heart healthy nutrition. Gives an explanation and review of dietary fats and fiber.   Controlling Sodium/Reading Food Labels: -Group verbal and written material supporting the discussion of sodium use in heart healthy nutrition. Review and explanation with models, verbal and written materials for utilization of the food label.   Exercise Physiology & Risk Factors: - Group verbal and written instruction with models to review the exercise physiology of the cardiovascular system and associated critical values. Details cardiovascular disease risk factors and the goals associated with each risk factor.   Aerobic Exercise & Resistance Training: - Gives group verbal and written discussion on the health impact of inactivity. On the components of aerobic and resistive training programs and the benefits of this training and how to safely progress through these programs.   Flexibility, Balance, General Exercise Guidelines: - Provides group  verbal and written instruction on the benefits of flexibility and balance training programs. Provides general exercise guidelines with specific guidelines to those with heart or lung disease. Demonstration and skill practice provided.   Stress Management: - Provides group verbal and written instruction about the health risks of elevated stress, cause of high stress, and healthy ways to reduce stress.   Depression: - Provides group verbal and  written instruction on the correlation between heart/lung disease and depressed mood, treatment options, and the stigmas associated with seeking treatment.   Exercise & Equipment Safety: - Individual verbal instruction and demonstration of equipment use and safety with use of the equipment.   Pulmonary Rehab from 08/12/2016 in [facility] Pulmonary Rehab  Date  08/12/16  Educator  SB  Instruction Review Code  2- meets goals/outcomes      Infection Prevention: - Provides verbal and written material to individual with discussion of infection control including proper hand washing and proper equipment cleaning during exercise session.   Pulmonary Rehab from 08/12/2016 in [facility] Re[facility]  Date  08/12/16  Educator  Sb  Instruction Review Code  2- meets goals/outcomes      Falls Prevention: - Provides verbal and written material to individual with discussion of falls prevention and safety.   Pulmonary Rehab from 08/12/2016 in [facility] Card[facility] and Pulmonary Rehab  Date  08/12/16  Educator  SB  Instruction Review Code  2- meets goals/outcomes      Diabetes: - Individual verbal and written instruction to review signs/symptoms of diabetes, desired ranges of glucose level fasting, after meals and with exercise. Advice that pre and post exercise glucose checks will be done for 3 sessions at entry of program.   Chronic Lung Diseases: - Group verbal and written instruction to review new updates, new respiratory medications, new advancements in procedures and treatments. Provide informative websites and "800" numbers of self-education.   Lung Procedures: - Group verbal and written instruction to describe testing methods done to diagnose lung disease. Review the outcome of test results. Describe the treatment choices: Pulmonary Function Tests, ABGs and oximetry.   Energy Conservation: - Provide group verbal and written instruction for methods to conserve energy, plan and  organize activities. Instruct on pacing techniques, use of adaptive equipment and posture/positioning to relieve shortness of breath.   Triggers: - Group verbal and written instruction to review types of environmental controls: home humidity, furnaces, filters, dust mite/pet prevention, HEPA vacuums. To discuss weather changes, air quality and the benefits of nasal washing.   Exacerbations: - Group verbal and written instruction to provide: warning signs, infection symptoms, calling MD promptly, preventive modes, and value of vaccinations. Review: effective airway clearance, coughing and/or vibration techniques. Create an Sports administrator.   Oxygen: - Individual and group verbal and written instruction on oxygen therapy. Includes supplement oxygen, available portable oxygen systems, continuous and intermittent flow rates, oxygen safety, concentrators, and Medicare reimbursement for oxygen.   Respiratory Medications: - Group verbal and written instruction to review medications for lung disease. Drug class, frequency, complications, importance of spacers, rinsing mouth after steroid MDI's, and proper cleaning methods for nebulizers.   AED/CPR: - Group verbal and written instruction with the use of models to demonstrate the basic use of the AED with the basic ABC's of resuscitation.   Breathing Retraining: - Provides individuals verbal and written instruction on purpose, frequency, and proper technique of diaphragmatic breathing and pursed-lipped breathing. Applies individual practice skills.   Anatomy and Physiology of the Lungs: -  Group verbal and written instruction with the use of models to provide basic lung anatomy and physiology related to function, structure and complications of lung disease.   Heart Failure: - Group verbal and written instruction on the basics of heart failure: signs/symptoms, treatments, explanation of ejection fraction, enlarged heart and cardiomyopathy.   Sleep  Apnea: - Individual verbal and written instruction to review Obstructive Sleep Apnea. Review of risk factors, methods for diagnosing and types of masks and machines for OSA.   Anxiety: - Provides group, verbal and written instruction on the correlation between heart/lung disease and anxiety, treatment options, and management of anxiety.   Relaxation: - Provides group, verbal and written instruction about the benefits of relaxation for patients with heart/lung disease. Also provides patients with examples of relaxation techniques.   Knowledge Questionnaire Score:     Knowledge Questionnaire Score - 08/12/16 1232      Knowledge Questionnaire Score   Pre Score 9/10  reviewed correct response with Bill. He verbalized understanding of the correct response.       Core Components/Risk Factors/Patient Goals at Admission:     Personal Goals and Risk Factors at Admission - 08/12/16 1222      Core Components/Risk Factors/Patient Goals on Admission    Weight Management Yes   Intervention Weight Management: Develop a combined nutrition and exercise program designed to reach desired caloric intake, while maintaining appropriate intake of nutrient and fiber, sodium and fats, and appropriate energy expenditure required for the weight goal.   Admit Weight 227 lb 3.2 oz (103.1 kg)   Goal Weight: Short Term 224 lb (101.6 kg)   Goal Weight: Long Term 180 lb (81.6 kg)   Expected Outcomes Short Term: Continue to assess and modify interventions until short term weight is achieved;Long Term: Adherence to nutrition and physical activity/exercise program aimed toward attainment of established weight goal;Weight Loss: Understanding of general recommendations for a balanced deficit meal plan, which promotes 1-2 lb weight loss per week and includes a negative energy balance of [phone] kcal/d   Improve shortness of breath with ADL's Yes   Intervention Provide education, individualized exercise plan and daily  activity instruction to help decrease symptoms of SOB with activities of daily living.   Expected Outcomes Short Term: Achieves a reduction of symptoms when performing activities of daily living.   Develop more efficient breathing techniques such as purse lipped breathing and diaphragmatic breathing; and practicing self-pacing with activity Yes   Intervention Provide education, demonstration and support about specific breathing techniuqes utilized for more efficient breathing. Include techniques such as pursed lipped breathing, diaphragmatic breathing and self-pacing activity.   Expected Outcomes Short Term: Participant will be able to demonstrate and use breathing techniques as needed throughout daily activities.   Increase knowledge of respiratory medications and ability to use respiratory devices properly  Yes   Intervention Provide education and demonstration as needed of appropriate use of medications, inhalers, and oxygen therapy.   Expected Outcomes Short Term: Achieves understanding of medications use. Understands that oxygen is a medication prescribed by physician. Demonstrates appropriate use of inhaler and oxygen therapy.   Hypertension Yes   Intervention Provide education on lifestyle modifcations including regular physical activity/exercise, weight management, moderate sodium restriction and increased consumption of fresh fruit, vegetables, and low fat dairy, alcohol moderation, and smoking cessation.;Monitor prescription use compliance.   Expected Outcomes Short Term: Continued assessment and intervention until BP is < 140/54mm HG in hypertensive participants. < 130/41mm HG in hypertensive participants with diabetes, heart failure  or chronic kidney disease.;Long Term: Maintenance of blood pressure at goal levels.   Lipids Yes  Unable to tolerate statins   Intervention Provide education and support for participant on nutrition & aerobic/resistive exercise along with prescribed medications  to achieve LDL 70mg , HDL >40mg .   Expected Outcomes Short Term: Participant states understanding of desired cholesterol values and is compliant with medications prescribed. Participant is following exercise prescription and nutrition guidelines.;Long Term: Cholesterol controlled with medications as prescribed, with individualized exercise RX and with personalized nutrition plan. Value goals: LDL < 70mg , HDL > 40 mg.      Core Components/Risk Factors/Patient Goals Review:    Core Components/Risk Factors/Patient Goals at Discharge (Final Review):    ITP Comments:     ITP Comments    Row Name 08/12/16 1213           ITP Comments Medical review completed today with Initial ITP created.  Disgnosis documnetation can be found in [facility] Encounter date 07/22/2016          Comments:

## 2016-08-12 NOTE — Progress Notes (Signed)
Daily Session Note  Patient Details  Name: GUNNARD DORRANCE MRN: [id number] Date of Birth: 1951/01/06 Referring Provider:    Encounter Date: 08/12/2016  Check In:     Session Check In - 08/12/16 1211      Check-In   Location [facility]   Supervising physician immediately available to respond to emergencies [facility] immediately available ER MD   Physician(s) Dr Dineen Kid and Corky Downs   Medication changes reported     No   Fall or balance concerns reported    No   Warm-up and Cool-down Not performed (comment)  Medical Evaluation and 6 min walk today   Resistance Training Performed No   VAD Patient? No     Pain Assessment   Currently in Pain? No/denies         History  Smoking Status  . Never Smoker  Smokeless Tobacco  . Never Used    Goals Met:  Proper associated with RPD/PD & O2 Sat Exercise tolerated well Personal goals reviewed  Goals Unmet:  Not Applicable  Comments: Medical evaluation completed   Dr. Emily Filbert is Medical Director for [place] and [facility].

## 2016-08-12 NOTE — Patient Instructions (Signed)
Patient Instructions  Patient Details  Name: Alexander Barton MRN: [id number] Date of Birth: 1950/12/14 Referring Provider:  Flora Lipps, MD  Below are the personal goals you chose as well as exercise and nutrition goals. Our goal is to help you keep on track towards obtaining and maintaining your goals. We will be discussing your progress on these goals with you throughout the program.  Initial Exercise Prescription:     Initial Exercise Prescription - 08/12/16 1300      Date of Initial Exercise RX and Referring Provider   Date 08/12/16     Treadmill   MPH 2.5   Grade 0.5   Minutes 15   METs 2.9     Recumbant Bike   Level 2   RPM 60   Watts 30   Minutes 15   METs 2.9     T5 Nustep   Level 2   SPM 80   Minutes 15   METs 2.9     Prescription Details   Frequency (times per week) 3   Duration Progress to 45 minutes of aerobic exercise without signs/symptoms of physical distress     Intensity   THRR 40-80% of Max Heartrate 96-134   Ratings of Perceived Exertion 11-15   Perceived Dyspnea 0-4     Resistance Training   Training Prescription Yes   Weight 3 lb   Reps 10-15      Exercise Goals: Frequency: Be able to perform aerobic exercise three times per week working toward 3-5 days per week.  Intensity: Work with a perceived exertion of 11 (fairly light) - 15 (hard) as tolerated. Follow your new exercise prescription and watch for changes in prescription as you progress with the program. Changes will be reviewed with you when they are made.  Duration: You should be able to do 30 minutes of continuous aerobic exercise in addition to a 5 minute warm-up and a 5 minute cool-down routine.  Nutrition Goals: Your personal nutrition goals will be established when you do your nutrition analysis with the dietician.  The following are nutrition guidelines to follow: Cholesterol < 200mg /day Sodium < 1500mg /day Fiber: Men over 50 yrs - 30 grams per day  Personal  Goals:     Personal Goals and Risk Factors at Admission - 08/12/16 1222      Core Components/Risk Factors/Patient Goals on Admission    Weight Management Yes   Intervention Weight Management: Develop a combined nutrition and exercise program designed to reach desired caloric intake, while maintaining appropriate intake of nutrient and fiber, sodium and fats, and appropriate energy expenditure required for the weight goal.   Admit Weight 227 lb 3.2 oz (103.1 kg)   Goal Weight: Short Term 224 lb (101.6 kg)   Goal Weight: Long Term 180 lb (81.6 kg)   Expected Outcomes Short Term: Continue to assess and modify interventions until short term weight is achieved;Long Term: Adherence to nutrition and physical activity/exercise program aimed toward attainment of established weight goal;Weight Loss: Understanding of general recommendations for a balanced deficit meal plan, which promotes 1-2 lb weight loss per week and includes a negative energy balance of [phone] kcal/d   Improve shortness of breath with ADL's Yes   Intervention Provide education, individualized exercise plan and daily activity instruction to help decrease symptoms of SOB with activities of daily living.   Expected Outcomes Short Term: Achieves a reduction of symptoms when performing activities of daily living.   Develop more efficient breathing techniques such as purse lipped  breathing and diaphragmatic breathing; and practicing self-pacing with activity Yes   Intervention Provide education, demonstration and support about specific breathing techniuqes utilized for more efficient breathing. Include techniques such as pursed lipped breathing, diaphragmatic breathing and self-pacing activity.   Expected Outcomes Short Term: Participant will be able to demonstrate and use breathing techniques as needed throughout daily activities.   Increase knowledge of respiratory medications and ability to use respiratory devices properly  Yes    Intervention Provide education and demonstration as needed of appropriate use of medications, inhalers, and oxygen therapy.   Expected Outcomes Short Term: Achieves understanding of medications use. Understands that oxygen is a medication prescribed by physician. Demonstrates appropriate use of inhaler and oxygen therapy.   Hypertension Yes   Intervention Provide education on lifestyle modifcations including regular physical activity/exercise, weight management, moderate sodium restriction and increased consumption of fresh fruit, vegetables, and low fat dairy, alcohol moderation, and smoking cessation.;Monitor prescription use compliance.   Expected Outcomes Short Term: Continued assessment and intervention until BP is < 140/82mm HG in hypertensive participants. < 130/100mm HG in hypertensive participants with diabetes, heart failure or chronic kidney disease.;Long Term: Maintenance of blood pressure at goal levels.   Lipids Yes  Unable to tolerate statins   Intervention Provide education and support for participant on nutrition & aerobic/resistive exercise along with prescribed medications to achieve LDL 70mg , HDL >40mg .   Expected Outcomes Short Term: Participant states understanding of desired cholesterol values and is compliant with medications prescribed. Participant is following exercise prescription and nutrition guidelines.;Long Term: Cholesterol controlled with medications as prescribed, with individualized exercise RX and with personalized nutrition plan. Value goals: LDL < 70mg , HDL > 40 mg.      Tobacco Use Initial Evaluation: History  Smoking Status  . Never Smoker  Smokeless Tobacco  . Never Used    Copy of goals given to participant.

## 2016-08-13 ENCOUNTER — Telehealth: Payer: Self-pay | Admitting: Internal Medicine

## 2016-08-13 NOTE — Telephone Encounter (Signed)
Patient informed results of ONO: no 02 needed at this time.

## 2016-08-19 ENCOUNTER — Encounter: Payer: Self-pay | Admitting: Internal Medicine

## 2016-08-19 DIAGNOSIS — J449 Chronic obstructive pulmonary disease, unspecified: Secondary | ICD-10-CM

## 2016-08-20 ENCOUNTER — Encounter: Payer: Medicare Other | Admitting: *Deleted

## 2016-08-20 DIAGNOSIS — R0600 Dyspnea, unspecified: Secondary | ICD-10-CM | POA: Diagnosis not present

## 2016-08-20 DIAGNOSIS — J449 Chronic obstructive pulmonary disease, unspecified: Secondary | ICD-10-CM

## 2016-08-20 NOTE — Progress Notes (Signed)
Daily Session Note  Patient Details  Name: Alexander Barton MRN: [id number] Date of Birth: Sep 26, 1950 Referring Provider:    Encounter Date: 08/20/2016  Check In:     Session Check In - 08/20/16 1028      Check-In   Location [facility]   Staff Present Alberteen Sam, MA, ACSM RCEP, Exercise Physiologist;Joseph Christain [place], RN BSN   Supervising physician immediately available to respond to emergencies [facility] immediately available ER MD   Physician(s) Drs. Lord and [employer]   Medication changes reported     No   Fall or balance concerns reported    No   Tobacco Cessation No Change   Warm-up and Cool-down Performed as group-led Location manager Performed Yes   VAD Patient? No     Pain Assessment   Currently in Pain? No/denies   Multiple Pain Sites No         History  Smoking Status  . Never Smoker  Smokeless Tobacco  . Never Used    Goals Met:  Proper associated with RPD/PD & O2 Sat Independence with exercise equipment Using PLB without cueing & demonstrates good technique Exercise tolerated well Queuing for purse lip breathing Strength training completed today  Goals Unmet:  Not Applicable  Comments: First full day of exercise!  Patient was oriented to gym and equipment including functions, settings, policies, and procedures.  Patient's individual exercise prescription and treatment plan were reviewed.  All starting workloads were established based on the results of the 6 minute walk test done at initial orientation visit.  The plan for exercise progression was also introduced and progression will be customized based on patient's performance and goals.    Dr. Emily Filbert is Medical Director for [place] and [facility].

## 2016-08-22 DIAGNOSIS — R0600 Dyspnea, unspecified: Secondary | ICD-10-CM

## 2016-08-22 DIAGNOSIS — J449 Chronic obstructive pulmonary disease, unspecified: Secondary | ICD-10-CM

## 2016-08-22 NOTE — Progress Notes (Signed)
Daily Session Note  Patient Details  Name: Alexander Barton MRN: [id number] Date of Birth: 05-09-1950 Referring Provider:    Encounter Date: 08/22/2016  Check In:     Session Check In - 08/22/16 1027      Check-In   Location [facility]   Staff Present Nada Maclachlan, BA, ACSM CEP, Exercise Physiologist;Meredith Sherryll Burger, RN BSN;Mandrell Vangilder Flavia Shipper   Supervising physician immediately available to respond to emergencies [facility] immediately available ER MD   Physician(s) Dr. Jacqualine Code and Corky Downs   Medication changes reported     No   Fall or balance concerns reported    No   Tobacco Cessation No Change   Warm-up and Cool-down Performed as group-led instruction   Resistance Training Performed Yes   VAD Patient? No     Pain Assessment   Currently in Pain? No/denies   Multiple Pain Sites No         History  Smoking Status  . Never Smoker  Smokeless Tobacco  . Never Used    Goals Met:  Proper associated with RPD/PD & O2 Sat Independence with exercise equipment Using PLB without cueing & demonstrates good technique Exercise tolerated well No report of cardiac concerns or symptoms  Goals Unmet:  Not Applicable  Comments: Pt able to follow exercise prescription today without complaint.  Will continue to monitor for progression.   Dr. Emily Filbert is Medical Director for [place] and [facility].

## 2016-08-28 ENCOUNTER — Ambulatory Visit (INDEPENDENT_AMBULATORY_CARE_PROVIDER_SITE_OTHER): Payer: Medicare Other | Admitting: Pharmacist Clinician (PhC)/ Clinical Pharmacy Specialist

## 2016-08-28 ENCOUNTER — Telehealth: Payer: Self-pay | Admitting: Cardiology

## 2016-08-28 DIAGNOSIS — Z7901 Long term (current) use of anticoagulants: Secondary | ICD-10-CM

## 2016-08-28 DIAGNOSIS — I359 Nonrheumatic aortic valve disorder, unspecified: Secondary | ICD-10-CM

## 2016-08-28 LAB — POCT INR

## 2016-08-28 NOTE — Telephone Encounter (Signed)
New message     Pt states he was talking to [place] and he got disconnected would like to speak with her again

## 2016-08-29 DIAGNOSIS — R0600 Dyspnea, unspecified: Secondary | ICD-10-CM

## 2016-08-29 DIAGNOSIS — J449 Chronic obstructive pulmonary disease, unspecified: Secondary | ICD-10-CM

## 2016-08-29 NOTE — Progress Notes (Signed)
Daily Session Note  Patient Details  Name: Alexander Barton MRN: [id number] Date of Birth: 12-18-1950 Referring Provider:    Encounter Date: 08/29/2016  Check In:     Session Check In - 08/29/16 1015      Check-In   Location [facility]   Staff Present Nada Maclachlan, BA, ACSM CEP, Exercise Physiologist;Carroll Enterkin, RN, BSN;Meer [employer] physician immediately available to respond to emergencies [facility] immediately available ER MD   Physician(s) Dr. Kerman Passey and Jimmye Norman   Medication changes reported     No   Fall or balance concerns reported    No   Warm-up and Cool-down Performed as group-led instruction   Resistance Training Performed Yes   VAD Patient? No     Pain Assessment   Currently in Pain? No/denies   Multiple Pain Sites No         History  Smoking Status  . Never Smoker  Smokeless Tobacco  . Never Used    Goals Met:  Proper associated with RPD/PD & O2 Sat Independence with exercise equipment Exercise tolerated well Strength training completed today  Goals Unmet:  Not Applicable  Comments: Pt able to follow exercise prescription today without complaint.  Will continue to monitor for progression.   Dr. Emily Filbert is Medical Director for [place] and [facility].

## 2016-09-01 ENCOUNTER — Encounter: Payer: Medicare Other | Admitting: *Deleted

## 2016-09-01 DIAGNOSIS — R0600 Dyspnea, unspecified: Secondary | ICD-10-CM

## 2016-09-01 DIAGNOSIS — J449 Chronic obstructive pulmonary disease, unspecified: Secondary | ICD-10-CM

## 2016-09-01 NOTE — Progress Notes (Signed)
Pulmonary Individual Treatment Plan  Patient Details  Name: Alexander Barton MRN: [id number] Date of Birth: [birth year] Referring Provider:    Initial Encounter Date:    Pulmonary Rehab from 08/12/2016 in [facility] Cardiac and Pulmonary Rehab  Date  08/12/16      Visit Diagnosis: Chronic obstructive pulmonary disease, unspecified COPD type ([place])  Dyspnea, unspecified type  Patient's Home Medications on Admission:  Current Outpatient Prescriptions:  .  acidophilus (RISAQUAD) CAPS capsule, Take 2 capsules by mouth daily., Disp: , Rfl:  .  albuterol (PROVENTIL HFA;VENTOLIN HFA) 108 (90 Base) MCG/ACT inhaler, Inhale 2 puffs into the lungs every 4 (four) hours as needed for wheezing or shortness of breath., Disp: 1 Inhaler, Rfl: 3 .  Apoaequorin (PREVAGEN) 10 MG CAPS, Take 10 mg by mouth. Reported on 08/17/2015, Disp: , Rfl:  .  aspirin 81 MG tablet, Take 81 mg by mouth at bedtime. , Disp: , Rfl:  .  budesonide-formoterol (SYMBICORT) 160-4.5 MCG/ACT inhaler, Inhale 2 puffs into the lungs 2 (two) times daily., Disp: 1 Inhaler, Rfl: 12 .  cholecalciferol (VITAMIN D) 1000 units tablet, Take 1,000 Units by mouth daily., Disp: , Rfl:  .  metoprolol succinate (TOPROL-XL) 50 MG 24 hr tablet, TAKE 1 TABLET (50 MG TOTAL) BY MOUTH DAILY. TAKE WITH OR IMMEDIATELY FOLLOWING A MEAL., Disp: 90 tablet, Rfl: 1 .  Multiple Vitamin (MULTIVITAMIN WITH MINERALS) TABS tablet, Take 1 tablet by mouth 2 (two) times daily., Disp: , Rfl:  .  Nutritional Supplements (VITAMIN D PLUS COFACTORS PO), Take 1 capsule by mouth every evening., Disp: , Rfl:  .  omega-3 acid ethyl esters (LOVAZA) 1 g capsule, Take 1 g by mouth every evening., Disp: , Rfl:  .  SPIRULINA PO, Take 3 capsules by mouth daily., Disp: , Rfl:  .  tamsulosin (FLOMAX) 0.4 MG CAPS capsule, TAKE 2 CAPSULES (0.8 MG TOTAL) BY MOUTH DAILY. (Patient taking differently: Take 0.8 mg by mouth at bedtime. ), Disp: 180 capsule, Rfl: 3 .  terbinafine (LAMISIL) 250 MG  tablet, Take 250 mg by mouth at bedtime., Disp: , Rfl:  .  tiotropium (SPIRIVA) 18 MCG inhalation capsule, Place 1 capsule (18 mcg total) into inhaler and inhale daily. (Patient not taking: Reported on 08/12/2016), Disp: 30 capsule, Rfl: 12 .  Tiotropium Bromide Monohydrate (SPIRIVA RESPIMAT) 2.5 MCG/ACT AERS, Inhale 2.5 mcg into the lungs 2 (two) times daily., Disp: 1 Inhaler, Rfl: 12 .  warfarin (COUMADIN) 2 MG tablet, TAKE 1 TABLET WITH 5 MG TABLET DAILY AS DIRECTED BY COUMADIN CLINIC, Disp: 90 tablet, Rfl: 0 .  warfarin (COUMADIN) 5 MG tablet, Take 5-10 mg by mouth at bedtime. 10mg  on Monday, Wednesday, Friday. Take with 2mg  tab for a total 7mg  on all other days., Disp: , Rfl:   Past Medical History: Past Medical History:  Diagnosis Date  . Aortic aneurysm, thoracic ([place])   . Aortic valve disease   . Arthritis   . BPH (benign prostatic hyperplasia)   . COPD (chronic obstructive pulmonary disease) ([place])   . GERD (gastroesophageal reflux disease)   . HTN (hypertension)   . Hypercholesterolemia   . Stroke ([place])   . TIA (transient ischemic attack)     Tobacco Use: History  Smoking Status  . Never Smoker  Smokeless Tobacco  . Never Used    Labs: Recent Review Flowsheet Data    Labs for ITP Cardiac and Pulmonary Rehab Latest Ref Rng & Units 03/01/2010 03/02/2010 06/04/2012 11/14/2014 10/03/2015   Cholestrol 0 - 200  mg/dL - 118 ATP III CLASSIFICATION: <200     mg/dL   Desirable 200-239  mg/dL   Borderline High >=240    mg/dL   High 247(H) 254(H) 174   LDLCALC 0 - 99 mg/dL - 67 Total Cholesterol/HDL:CHD Risk Coronary Heart Disease Risk Table Men   Women 1/2 Average Risk   3.4   3.3 Average Risk       5.0   4.4 2 X Average Risk   9.6   7.1 3 X Average Risk  23.4   11.0 Use the calculated Patient Ratio above and the CHD Risk Table to determine the patient's CHD Risk. ATP III CLASSIFICATION (LDL): <100     mg/dL   Optimal 100-129  mg/dL   Near or Above Optimal 130-159  mg/dL    Borderline 160-189  mg/dL   High >190     mg/dL   Very High 169(H) - 87   LDLDIRECT mg/dL - - - 172.0 -   HDL >39.00 mg/dL - 21(L) 45 48.50 47.20   Trlycerides 0.0 - 149.0 mg/dL - 152(H) 165(H) 202.0(H) 195.0(H)   Hemoglobin A1c 4.6 - 6.5 % 6.2 (NOTE)                                                                       According to the ADA Clinical Practice Recommendations for 2011, when HbA1c is used as a screening test:   >=6.5%   Diagnostic of Diabetes Mellitus           (if abnormal result is confirmed)  5.7-6.4%   Increased risk of developing Diabetes Mellitus  References:Diagnosis and Classification of Diabetes Mellitus,Diabetes RWER,1540,08(QPYPP 1):S62-S69 and Standards of Medical Care in         Diabetes - 2011,Diabetes Care,2011,34 (Suppl 1):S11-S61.(H) - 5.9(H) - 6.2       ADL [facility]:     Pulmonary Assessment Scores    Row Name 08/12/16 1233 08/12/16 1527       ADL UCSD   ADL Phase Entry  -    SOB Score total 63  -    Rest 2  -    Walk 3  -    Stairs 5  -    Bath 3  -    Dress 2  -    Shop 2  -      mMRC Score   mMRC Score  - 1       Pulmonary Function Assessment:     Pulmonary Function Assessment - 08/12/16 1238      Initial Spirometry Results   FVC% 62 %   FEV1% 73 %   FEV1/FVC Ratio 74     Post Bronchodilator Spirometry Results   FVC% 71 %   FEV1% 76 %   FEV1/FVC Ratio 81      Exercise Target Goals:    Exercise Program Goal: Individual exercise prescription set with THRR, safety & activity barriers. Participant demonstrates ability to understand and report RPE using BORG scale, to self-measure pulse accurately, and to acknowledge the importance of the exercise prescription.  Exercise Prescription Goal: Starting with aerobic activity 30 plus minutes a day, 3 days per week for initial exercise prescription. Provide home exercise prescription and guidelines that participant  acknowledges understanding prior to discharge.  Activity Barriers & Risk  Stratification:   6 Minute Walk:     6 Minute Walk    Row Name 08/12/16 1313 08/12/16 1526       6 Minute Walk   Phase Initial Initial    Distance 1400 feet 1400 feet    Walk Time 6 minutes 6 minutes    # of Rest Breaks 0  -    MPH 2.65  -    METS 3.03  -    RPE 14 14    Perceived Dyspnea  2.5 2.5    VO2 Peak 10.61  -    Symptoms No No    Resting HR 58 bpm 58 bpm    Resting BP 106/64 106/64    Max Ex. HR 95 bpm 95 bpm    Max Ex. BP 120/58 120/58      Interval HR   Baseline HR 58  -    1 Minute HR 64  -    2 Minute HR 68  -    3 Minute HR 68  -    5 Minute HR 95  -    6 Minute HR 93  -    Interval Heart Rate? Yes  -      Interval Oxygen   Interval Oxygen? Yes  -    Baseline Oxygen Saturation % 96 %  -    Baseline Liters of Oxygen 0 L  -    1 Minute Oxygen Saturation % 80 %  -    1 Minute Liters of Oxygen 0 L  -    2 Minute Oxygen Saturation % 80 %  -    2 Minute Liters of Oxygen 0 L  -    3 Minute Oxygen Saturation % 83 %  -    3 Minute Liters of Oxygen 0 L  -    4 Minute Liters of Oxygen 0 L  -    5 Minute Oxygen Saturation % 97 %  -    5 Minute Liters of Oxygen 0 L  -    6 Minute Oxygen Saturation % 100 %  -    6 Minute Liters of Oxygen 0 L  -    2 Minute Post Liters of Oxygen 0 L  -      Oxygen Initial Assessment:     Oxygen Initial Assessment - 08/12/16 1213      Home Oxygen   Home Oxygen Device None   Sleep Oxygen Prescription None   Home Exercise Oxygen Prescription None   Home at Rest Exercise Oxygen Prescription None      Oxygen Re-Evaluation:     Oxygen Re-Evaluation    Row Name 08/20/16 1122             Goals/Expected Outcomes   Short Term Goals To learn and demonstrate proper purse lipped breathing techniques or other breathing techniques.       Comments Reviewed pursed lip breathing with pt today.  Talked about how to use pursed lip breathing to improve breath control and discussed technique.       Goals/Expected Outcomes  Short: Become more proficient at using PLB.  Long: Become independent at using PLB.          Oxygen Discharge (Final Oxygen Re-Evaluation):     Oxygen Re-Evaluation - 08/20/16 1122      Goals/Expected Outcomes   Short Term Goals To learn and demonstrate proper purse lipped breathing  techniques or other breathing techniques.   Comments Reviewed pursed lip breathing with pt today.  Talked about how to use pursed lip breathing to improve breath control and discussed technique.   Goals/Expected Outcomes Short: Become more proficient at using PLB.  Long: Become independent at using PLB.      Initial Exercise Prescription:     Initial Exercise Prescription - 08/12/16 1300      Date of Initial Exercise RX and Referring Provider   Date 08/12/16     Treadmill   MPH 2.5   Grade 0.5   Minutes 15   METs 2.9     Recumbant Bike   Level 2   RPM 60   Watts 30   Minutes 15   METs 2.9     T5 Nustep   Level 2   SPM 80   Minutes 15   METs 2.9     Prescription Details   Frequency (times per week) 3   Duration Progress to 45 minutes of aerobic exercise without signs/symptoms of physical distress     Intensity   THRR 40-80% of Max Heartrate 96-134   Ratings of Perceived Exertion 11-15   Perceived Dyspnea 0-4     Resistance Training   Training Prescription Yes   Weight 3 lb   Reps 10-15      Perform Capillary Blood Glucose checks as needed.  Exercise Prescription Changes:     Exercise Prescription Changes    Row Name 08/20/16 1100             Response to Exercise   Blood Pressure (Admit) 104/68       Blood Pressure (Exercise) 122/58       Blood Pressure (Exit) 100/60       Heart Rate (Admit) 78 bpm       Heart Rate (Exercise) 115 bpm       Heart Rate (Exit) 73 bpm       Oxygen Saturation (Admit) 97 %       Oxygen Saturation (Exercise) 96 %       Oxygen Saturation (Exit) 97 %       Rating of Perceived Exertion (Exercise) 12       Perceived Dyspnea (Exercise)  1       Symptoms none       Comments first full day of exercise       Duration Progress to 45 minutes of aerobic exercise without signs/symptoms of physical distress       Intensity THRR unchanged         Progression   Progression Continue to progress workloads to maintain intensity without signs/symptoms of physical distress.       Average METs 2.5         Resistance Training   Training Prescription Yes       Weight body weight  late arrival       Reps 10-15         Interval Training   Interval Training No         Treadmill   MPH 2.5       Grade 0.5       Minutes 15       METs 2.9         T5 Nustep   Level 2       SPM 75       Minutes 15       METs 2.1  Exercise Comments:     Exercise Comments    Row Name 08/20/16 1030           Exercise Comments First full day of exercise!  Patient was oriented to gym and equipment including functions, settings, policies, and procedures.  Patient's individual exercise prescription and treatment plan were reviewed.  All starting workloads were established based on the results of the 6 minute walk test done at initial orientation visit.  The plan for exercise progression was also introduced and progression will be customized based on patient's performance and goals.          Exercise Goals and Review:     Exercise Goals    Row Name 08/12/16 1313             Exercise Goals   Increase Physical Activity Yes       Intervention Provide advice, education, support and counseling about physical activity/exercise needs.;Develop an individualized exercise prescription for aerobic and resistive training based on initial evaluation findings, risk stratification, comorbidities and participant's personal goals.       Expected Outcomes Achievement of increased cardiorespiratory fitness and enhanced flexibility, muscular endurance and strength shown through measurements of functional capacity and personal statement of participant.        Increase Strength and Stamina Yes       Intervention Provide advice, education, support and counseling about physical activity/exercise needs.;Develop an individualized exercise prescription for aerobic and resistive training based on initial evaluation findings, risk stratification, comorbidities and participant's personal goals.       Expected Outcomes Achievement of increased cardiorespiratory fitness and enhanced flexibility, muscular endurance and strength shown through measurements of functional capacity and personal statement of participant.          Exercise Goals Re-Evaluation :   Discharge Exercise Prescription (Final Exercise Prescription Changes):     Exercise Prescription Changes - 08/20/16 1100      Response to Exercise   Blood Pressure (Admit) 104/68   Blood Pressure (Exercise) 122/58   Blood Pressure (Exit) 100/60   Heart Rate (Admit) 78 bpm   Heart Rate (Exercise) 115 bpm   Heart Rate (Exit) 73 bpm   Oxygen Saturation (Admit) 97 %   Oxygen Saturation (Exercise) 96 %   Oxygen Saturation (Exit) 97 %   Rating of Perceived Exertion (Exercise) 12   Perceived Dyspnea (Exercise) 1   Symptoms none   Comments first full day of exercise   Duration Progress to 45 minutes of aerobic exercise without signs/symptoms of physical distress   Intensity THRR unchanged     Progression   Progression Continue to progress workloads to maintain intensity without signs/symptoms of physical distress.   Average METs 2.5     Resistance Training   Training Prescription Yes   Weight body weight  late arrival   Reps 10-15     Interval Training   Interval Training No     Treadmill   MPH 2.5   Grade 0.5   Minutes 15   METs 2.9     T5 Nustep   Level 2   SPM 75   Minutes 15   METs 2.1      Nutrition:  Target Goals: Understanding of nutrition guidelines, daily intake of sodium 1500mg , cholesterol 200mg , calories 30% from fat and 7% or less from saturated fats, daily to  have 5 or more servings of fruits and vegetables.  Biometrics:     Pre Biometrics - 08/12/16 1312  Pre Biometrics   Height 6\' 1"  (1.854 m)   Weight 227 lb 3.2 oz (103.1 kg)   Waist Circumference 46.5 inches   Hip Circumference 45 inches   Waist to Hip Ratio 1.03 %   BMI (Calculated) 30       Nutrition Therapy Plan and Nutrition Goals:     Nutrition Therapy & Goals - 08/12/16 1222      Intervention Plan   Intervention Prescribe, educate and counsel regarding individualized specific dietary modifications aiming towards targeted core components such as weight, hypertension, lipid management, diabetes, heart failure and other comorbidities.   Expected Outcomes Short Term Goal: Understand basic principles of dietary content, such as calories, fat, sodium, cholesterol and nutrients.;Short Term Goal: A plan has been developed with personal nutrition goals set during dietitian appointment.;Long Term Goal: Adherence to prescribed nutrition plan.      Nutrition Discharge: Rate Your Plate Scores:   Nutrition Goals Re-Evaluation:   Nutrition Goals Discharge (Final Nutrition Goals Re-Evaluation):   Psychosocial: Target Goals: Acknowledge presence or absence of significant depression and/or stress, maximize coping skills, provide positive support system. Participant is able to verbalize types and ability to use techniques and skills needed for reducing stress and depression.   Initial Review & Psychosocial Screening:     Initial Psych Review & Screening - 08/12/16 1225      Initial Review   Current issues with History of Depression;Current Sleep Concerns  Over 20 years ago with depression history.  Sleep up several times a night, sometimes hard to get back to sleep.  Will use over the counter aid about 1-2 times a month to get back to sleep.      Family Dynamics   Good Support System? Yes  WOnderful wife of almost 59 years, children     Barriers   Psychosocial barriers to  participate in program There are no identifiable barriers or psychosocial needs.;The patient should benefit from training in stress management and relaxation.     Screening Interventions   Interventions Encouraged to exercise;To provide support and resources with identified psychosocial needs      Quality of Life Scores:     Quality of Life - 08/12/16 1230      Quality of Life Scores   Health/Function Pre 19.2 %   Socioeconomic Pre 26.79 %   Psych/Spiritual Pre 27.86 %   Family Pre 30 %   GLOBAL Pre 24.13 %      PHQ-9: Recent Review Flowsheet Data    Depression screen [facility] 2/9 08/12/2016 12/05/2015   Decreased Interest 0 0   Down, Depressed, Hopeless 0 0   PHQ - 2 Score 0 0   Altered sleeping 0 -   Tired, decreased energy 2 -   Change in appetite 0 -   Feeling bad or failure about yourself  0 -   Trouble concentrating 0 -   Moving slowly or fidgety/restless 0 -   Suicidal thoughts 0 -   PHQ-9 Score 2 -   Difficult doing work/chores Not difficult at all -     Interpretation of Total Score  Total Score Depression Severity:  1-4 = Minimal depression, 5-9 = Mild depression, 10-14 = Moderate depression, 15-19 = Moderately severe depression, 20-27 = Severe depression   Psychosocial Evaluation and Intervention:   Psychosocial Re-Evaluation:   Psychosocial Discharge (Final Psychosocial Re-Evaluation):   Education: Education Goals: Education classes will be provided on a weekly basis, covering required topics. Participant will state understanding/return demonstration of topics presented.  Learning Barriers/Preferences:     Learning Barriers/Preferences - 08/12/16 1232      Learning Barriers/Preferences   Learning Barriers None   Learning Preferences None      Education Topics: Initial Evaluation Education: - Verbal, written and demonstration of respiratory meds, RPE/PD scales, oximetry and breathing techniques. Instruction on use of nebulizers and MDIs: cleaning  and proper use, rinsing mouth with steroid doses and importance of monitoring MDI activations.   Pulmonary Rehab from 08/29/2016 in [facility] Cardiac and Pulmonary Rehab  Date  08/12/16  Educator  SB  Instruction Review Code  2- meets goals/outcomes      General Nutrition Guidelines/Fats and Fiber: -Group instruction provided by verbal, written material, models and posters to present the general guidelines for heart healthy nutrition. Gives an explanation and review of dietary fats and fiber.   Controlling Sodium/Reading Food Labels: -Group verbal and written material supporting the discussion of sodium use in heart healthy nutrition. Review and explanation with models, verbal and written materials for utilization of the food label.   Exercise Physiology & Risk Factors: - Group verbal and written instruction with models to review the exercise physiology of the cardiovascular system and associated critical values. Details cardiovascular disease risk factors and the goals associated with each risk factor.   Aerobic Exercise & Resistance Training: - Gives group verbal and written discussion on the health impact of inactivity. On the components of aerobic and resistive training programs and the benefits of this training and how to safely progress through these programs.   Pulmonary Rehab from 08/29/2016 in [facility] Cardiac and Pulmonary Rehab  Date  08/29/16  Educator  [facility]  Instruction Review Code  2- meets goals/outcomes      Flexibility, Balance, General Exercise Guidelines: - Provides group verbal and written instruction on the benefits of flexibility and balance training programs. Provides general exercise guidelines with specific guidelines to those with heart or lung disease. Demonstration and skill practice provided.   Stress Management: - Provides group verbal and written instruction about the health risks of elevated stress, cause of high stress, and healthy ways to reduce  stress.   Depression: - Provides group verbal and written instruction on the correlation between heart/lung disease and depressed mood, treatment options, and the stigmas associated with seeking treatment.   Exercise & Equipment Safety: - Individual verbal instruction and demonstration of equipment use and safety with use of the equipment.   Pulmonary Rehab from 08/29/2016 in [facility] Cardiac and Pulmonary Rehab  Date  08/12/16  Educator  SB  Instruction Review Code  2- meets goals/outcomes      Infection Prevention: - Provides verbal and written material to individual with discussion of infection control including proper hand washing and proper equipment cleaning during exercise session.   Pulmonary Rehab from 08/29/2016 in [facility] Cardiac and Pulmonary Rehab  Date  08/12/16  Educator  Sb  Instruction Review Code  2- meets goals/outcomes      Falls Prevention: - Provides verbal and written material to individual with discussion of falls prevention and safety.   Pulmonary Rehab from 08/29/2016 in [facility] Cardiac and Pulmonary Rehab  Date  08/12/16  Educator  SB  Instruction Review Code  2- meets goals/outcomes      Diabetes: - Individual verbal and written instruction to review signs/symptoms of diabetes, desired ranges of glucose level fasting, after meals and with exercise. Advice that pre and post exercise glucose checks will be done for 3 sessions at entry of program.   Chronic  Lung Diseases: - Group verbal and written instruction to review new updates, new respiratory medications, new advancements in procedures and treatments. Provide informative websites and "800" numbers of self-education.   Pulmonary Rehab from 08/29/2016 in [facility] Cardiac and Pulmonary Rehab  Date  08/20/16  Educator  [facility]  Instruction Review Code  2- meets goals/outcomes      Lung Procedures: - Group verbal and written instruction to describe testing methods done to diagnose lung disease. Review the outcome of  test results. Describe the treatment choices: Pulmonary Function Tests, ABGs and oximetry.   Energy Conservation: - Provide group verbal and written instruction for methods to conserve energy, plan and organize activities. Instruct on pacing techniques, use of adaptive equipment and posture/positioning to relieve shortness of breath.   Triggers: - Group verbal and written instruction to review types of environmental controls: home humidity, furnaces, filters, dust mite/pet prevention, HEPA vacuums. To discuss weather changes, air quality and the benefits of nasal washing.   Exacerbations: - Group verbal and written instruction to provide: warning signs, infection symptoms, calling MD promptly, preventive modes, and value of vaccinations. Review: effective airway clearance, coughing and/or vibration techniques. Create an Sports administrator.   Oxygen: - Individual and group verbal and written instruction on oxygen therapy. Includes supplement oxygen, available portable oxygen systems, continuous and intermittent flow rates, oxygen safety, concentrators, and Medicare reimbursement for oxygen.   Respiratory Medications: - Group verbal and written instruction to review medications for lung disease. Drug class, frequency, complications, importance of spacers, rinsing mouth after steroid MDI's, and proper cleaning methods for nebulizers.   AED/CPR: - Group verbal and written instruction with the use of models to demonstrate the basic use of the AED with the basic ABC's of resuscitation.   Breathing Retraining: - Provides individuals verbal and written instruction on purpose, frequency, and proper technique of diaphragmatic breathing and pursed-lipped breathing. Applies individual practice skills.   Anatomy and Physiology of the Lungs: - Group verbal and written instruction with the use of models to provide basic lung anatomy and physiology related to function, structure and complications of lung  disease.   Heart Failure: - Group verbal and written instruction on the basics of heart failure: signs/symptoms, treatments, explanation of ejection fraction, enlarged heart and cardiomyopathy.   Sleep Apnea: - Individual verbal and written instruction to review Obstructive Sleep Apnea. Review of risk factors, methods for diagnosing and types of masks and machines for OSA.   Anxiety: - Provides group, verbal and written instruction on the correlation between heart/lung disease and anxiety, treatment options, and management of anxiety.   Relaxation: - Provides group, verbal and written instruction about the benefits of relaxation for patients with heart/lung disease. Also provides patients with examples of relaxation techniques.   Knowledge Questionnaire Score:     Knowledge Questionnaire Score - 08/12/16 1232      Knowledge Questionnaire Score   Pre Score 9/10  reviewed correct response with Bill. He verbalized understanding of the correct response.       Core Components/Risk Factors/Patient Goals at Admission:     Personal Goals and Risk Factors at Admission - 08/12/16 1222      Core Components/Risk Factors/Patient Goals on Admission    Weight Management Yes   Intervention Weight Management: Develop a combined nutrition and exercise program designed to reach desired caloric intake, while maintaining appropriate intake of nutrient and fiber, sodium and fats, and appropriate energy expenditure required for the weight goal.   Admit Weight 227 lb 3.2 oz (  103.1 kg)   Goal Weight: Short Term 224 lb (101.6 kg)   Goal Weight: Long Term 180 lb (81.6 kg)   Expected Outcomes Short Term: Continue to assess and modify interventions until short term weight is achieved;Long Term: Adherence to nutrition and physical activity/exercise program aimed toward attainment of established weight goal;Weight Loss: Understanding of general recommendations for a balanced deficit meal plan, which  promotes 1-2 lb weight loss per week and includes a negative energy balance of [phone] kcal/d   Improve shortness of breath with ADL's Yes   Intervention Provide education, individualized exercise plan and daily activity instruction to help decrease symptoms of SOB with activities of daily living.   Expected Outcomes Short Term: Achieves a reduction of symptoms when performing activities of daily living.   Develop more efficient breathing techniques such as purse lipped breathing and diaphragmatic breathing; and practicing self-pacing with activity Yes   Intervention Provide education, demonstration and support about specific breathing techniuqes utilized for more efficient breathing. Include techniques such as pursed lipped breathing, diaphragmatic breathing and self-pacing activity.   Expected Outcomes Short Term: Participant will be able to demonstrate and use breathing techniques as needed throughout daily activities.   Increase knowledge of respiratory medications and ability to use respiratory devices properly  Yes   Intervention Provide education and demonstration as needed of appropriate use of medications, inhalers, and oxygen therapy.   Expected Outcomes Short Term: Achieves understanding of medications use. Understands that oxygen is a medication prescribed by physician. Demonstrates appropriate use of inhaler and oxygen therapy.   Hypertension Yes   Intervention Provide education on lifestyle modifcations including regular physical activity/exercise, weight management, moderate sodium restriction and increased consumption of fresh fruit, vegetables, and low fat dairy, alcohol moderation, and smoking cessation.;Monitor prescription use compliance.   Expected Outcomes Short Term: Continued assessment and intervention until BP is < 140/51mm HG in hypertensive participants. < 130/60mm HG in hypertensive participants with diabetes, heart failure or chronic kidney disease.;Long Term: Maintenance  of blood pressure at goal levels.   Lipids Yes  Unable to tolerate statins   Intervention Provide education and support for participant on nutrition & aerobic/resistive exercise along with prescribed medications to achieve LDL 70mg , HDL >40mg .   Expected Outcomes Short Term: Participant states understanding of desired cholesterol values and is compliant with medications prescribed. Participant is following exercise prescription and nutrition guidelines.;Long Term: Cholesterol controlled with medications as prescribed, with individualized exercise RX and with personalized nutrition plan. Value goals: LDL < 70mg , HDL > 40 mg.      Core Components/Risk Factors/Patient Goals Review:    Core Components/Risk Factors/Patient Goals at Discharge (Final Review):    ITP Comments:     ITP Comments    Row Name 08/12/16 1213 08/22/16 1029 09/01/16 0900       ITP Comments Medical review completed today with Initial ITP created.  Disgnosis documnetation can be found in [facility] Encounter date 07/22/2016 Patient attened Education "Know Your Numbers" 30 day review completed ITP sent to Dr. Ramonita Lab for Dr. Emily Filbert Director of [place]. Continue with ITP unless changes are made by physician.         Comments:

## 2016-09-01 NOTE — Progress Notes (Signed)
Daily Session Note  Patient Details  Name: Alexander Barton MRN: [id number] Date of Birth: 31-Jan-1951 Referring Provider:    Encounter Date: 09/01/2016  Check In:     Session Check In - 09/01/16 1007      Check-In   Location [facility]   Staff Present Justin Mend Jaci Carrel, BS, ACSM CEP, Exercise Physiologist;Amanda Oletta Darter, [place], ACSM CEP, Exercise Physiologist   Supervising physician immediately available to respond to emergencies [facility] immediately available ER MD   Physician(s) Dr. Shirl Harris and Dr. Quentin Cornwall   Medication changes reported     No   Fall or balance concerns reported    No   Warm-up and Cool-down Performed as group-led instruction   Resistance Training Performed Yes   VAD Patient? No     Pain Assessment   Currently in Pain? No/denies   Multiple Pain Sites No         History  Smoking Status  . Never Smoker  Smokeless Tobacco  . Never Used    Goals Met:  Proper associated with RPD/PD & O2 Sat Independence with exercise equipment Exercise tolerated well Strength training completed today  Goals Unmet:  Not Applicable  Comments: Pt able to follow exercise prescription today without complaint.  Will continue to monitor for progression.    Dr. Emily Filbert is Medical Director for [place] and [facility].

## 2016-09-03 ENCOUNTER — Encounter: Payer: Medicare Other | Attending: Internal Medicine

## 2016-09-03 DIAGNOSIS — J449 Chronic obstructive pulmonary disease, unspecified: Secondary | ICD-10-CM | POA: Insufficient documentation

## 2016-09-03 DIAGNOSIS — R0600 Dyspnea, unspecified: Secondary | ICD-10-CM | POA: Insufficient documentation

## 2016-09-05 ENCOUNTER — Encounter: Payer: Medicare Other | Admitting: *Deleted

## 2016-09-05 DIAGNOSIS — J449 Chronic obstructive pulmonary disease, unspecified: Secondary | ICD-10-CM | POA: Diagnosis present

## 2016-09-05 DIAGNOSIS — R0600 Dyspnea, unspecified: Secondary | ICD-10-CM | POA: Diagnosis present

## 2016-09-05 NOTE — Progress Notes (Signed)
Daily Session Note  Patient Details  Name: Alexander Barton MRN: [id number] Date of Birth: 1950/12/15 Referring Provider:    Encounter Date: 09/05/2016  Check In:     Session Check In - 09/05/16 1017      Check-In   Location [facility]   Staff Present Gerlene Burdock, RN, BSN;Joseph Hood RCP,RRT,BSRT   Physician(s) Dr. Alfred Levins and Dr. Jimmye Norman   Medication changes reported     No   Fall or balance concerns reported    No   Tobacco Cessation No Change   Warm-up and Cool-down Performed as group-led instruction   Resistance Training Performed Yes   VAD Patient? No     Pain Assessment   Currently in Pain? No/denies           Exercise Prescription Changes - 09/04/16 1500      Response to Exercise   Blood Pressure (Admit) 118/72   Blood Pressure (Exercise) 104/68   Blood Pressure (Exit) 110/60   Heart Rate (Admit) 81 bpm   Heart Rate (Exercise) 81 bpm   Heart Rate (Exit) 65 bpm   Oxygen Saturation (Admit) 98 %   Oxygen Saturation (Exercise) 96 %   Oxygen Saturation (Exit) 97 %   Rating of Perceived Exertion (Exercise) 12   Perceived Dyspnea (Exercise) 1   Symptoms none   Duration Continue with 45 min of aerobic exercise without signs/symptoms of physical distress.   Intensity THRR unchanged     Progression   Progression Continue to progress workloads to maintain intensity without signs/symptoms of physical distress.   Average METs 2.5     Resistance Training   Training Prescription Yes   Weight 4   Reps 10-15     Interval Training   Interval Training No     Recumbant Bike   Level 4   Watts 27   Minutes 15   METs 2.75     Arm Ergometer   Level 2   Minutes 15   METs 2.5     T5 Nustep   Level 4   SPM 83   Minutes 15   METs 2.1      History  Smoking Status  . Never Smoker  Smokeless Tobacco  . Never Used    Goals Met:  Proper associated with RPD/PD & O2 Sat Exercise tolerated well Strength training completed  today  Goals Unmet:  Not Applicable  Comments:     Dr. Emily Filbert is Medical Director for [place] and [facility].

## 2016-09-08 ENCOUNTER — Encounter: Payer: Medicare Other | Admitting: *Deleted

## 2016-09-08 DIAGNOSIS — J449 Chronic obstructive pulmonary disease, unspecified: Secondary | ICD-10-CM

## 2016-09-08 DIAGNOSIS — R0600 Dyspnea, unspecified: Secondary | ICD-10-CM | POA: Diagnosis not present

## 2016-09-08 NOTE — Progress Notes (Signed)
Daily Session Note  Patient Details  Name: BEARETT PORCARO MRN: [id number] Date of Birth: 1950-03-30 Referring Provider:    Encounter Date: 09/08/2016  Check In:     Session Check In - 09/08/16 1017      Check-In   Location [facility]   Staff Present Carson Myrtle, BS, RRT, Respiratory Therapist;Joseph Tedd Sias, BS, ACSM CEP, Exercise Physiologist   Supervising physician immediately available to respond to emergencies [facility] immediately available ER MD   Physician(s) Dr. Kerman Passey and Dr. Jimmye Norman    Medication changes reported     No   Fall or balance concerns reported    No   Warm-up and Cool-down Performed as group-led instruction   Resistance Training Performed Yes   VAD Patient? No     Pain Assessment   Currently in Pain? No/denies   Multiple Pain Sites No         History  Smoking Status  . Never Smoker  Smokeless Tobacco  . Never Used    Goals Met:  Proper associated with RPD/PD & O2 Sat Independence with exercise equipment Exercise tolerated well Strength training completed today  Goals Unmet:  Not Applicable  Comments: Pt able to follow exercise prescription today without complaint.  Will continue to monitor for progression.    Dr. Emily Filbert is Medical Director for [place] and [facility].

## 2016-09-10 ENCOUNTER — Encounter: Payer: Medicare Other | Admitting: *Deleted

## 2016-09-10 DIAGNOSIS — J449 Chronic obstructive pulmonary disease, unspecified: Secondary | ICD-10-CM

## 2016-09-10 DIAGNOSIS — R0600 Dyspnea, unspecified: Secondary | ICD-10-CM | POA: Diagnosis not present

## 2016-09-10 NOTE — Progress Notes (Signed)
Daily Session Note  Patient Details  Name: Alexander Barton MRN: [id number] Date of Birth: [birth year] Referring Provider:    Encounter Date: 09/10/2016  Check In:     Session Check In - 09/10/16 1013      Check-In   Location [facility]   Staff Present Heath Lark, RN, BSN, CCRP;Nickolus Wadding [place], [place], ACSM RCEP, Exercise Physiologist;Joseph Flavia Shipper   Supervising physician immediately available to respond to emergencies [facility] immediately available ER MD   Physician(s) Drs: Gwyndolyn Saxon and Archie Balboa   Medication changes reported     Yes   Fall or balance concerns reported    No   Warm-up and Cool-down Performed as group-led instruction   Resistance Training Performed Yes   VAD Patient? No     Pain Assessment   Currently in Pain? No/denies         History  Smoking Status  . Never Smoker  Smokeless Tobacco  . Never Used    Goals Met:  Independence with exercise equipment Exercise tolerated well Personal goals reviewed No report of cardiac concerns or symptoms Strength training completed today  Goals Unmet:  Not Applicable  Comments: Pt able to follow exercise prescription today without complaint.  Will continue to monitor for progression. Reviewed home exercise with pt today.  Pt plans to continue walking at home for exercise.  We talked about adding a minute at least each week in addition to adding in warm up and cool down to his current routine.  Reviewed THR, pulse, RPE, sign and symptoms, and when to call 911 or MD.  Also discussed weather considerations and indoor options.  Pt voiced understanding.    Dr. Emily Filbert is Medical Director for [place] and [facility].

## 2016-09-15 ENCOUNTER — Encounter: Payer: Medicare Other | Admitting: *Deleted

## 2016-09-15 DIAGNOSIS — R0600 Dyspnea, unspecified: Secondary | ICD-10-CM | POA: Diagnosis not present

## 2016-09-15 DIAGNOSIS — J449 Chronic obstructive pulmonary disease, unspecified: Secondary | ICD-10-CM

## 2016-09-15 LAB — POCT INR: INR: 2.5

## 2016-09-15 NOTE — Progress Notes (Signed)
Daily Session Note  Patient Details  Name: DECORIAN SCHUENEMANN MRN: [id number] Date of Birth: [birth year] Referring Provider:    Encounter Date: 09/15/2016  Check In:     Session Check In - 09/15/16 1017      Check-In   Location [facility]   Staff Present Justin Mend Jaci Carrel, BS, ACSM CEP, Exercise Physiologist;Amanda Oletta Darter, [place], ACSM CEP, Exercise Physiologist   Supervising physician immediately available to respond to emergencies [facility] immediately available ER MD   Physician(s) Dr. Mosie Lukes and Dr. Alfred Levins   Medication changes reported     No   Fall or balance concerns reported    No   Warm-up and Cool-down Performed as group-led instruction   Resistance Training Performed Yes   VAD Patient? No     Pain Assessment   Currently in Pain? No/denies   Multiple Pain Sites No         History  Smoking Status  . Never Smoker  Smokeless Tobacco  . Never Used    Goals Met:  Proper associated with RPD/PD & O2 Sat Independence with exercise equipment Exercise tolerated well Strength training completed today  Goals Unmet:  Not Applicable  Comments: Pt able to follow exercise prescription today without complaint.  Will continue to monitor for progression.    Dr. Emily Filbert is Medical Director for [place] and [facility].

## 2016-09-17 ENCOUNTER — Ambulatory Visit (INDEPENDENT_AMBULATORY_CARE_PROVIDER_SITE_OTHER): Payer: Medicare Other | Admitting: Pharmacist Clinician (PhC)/ Clinical Pharmacy Specialist

## 2016-09-17 ENCOUNTER — Encounter: Payer: Medicare Other | Admitting: *Deleted

## 2016-09-17 DIAGNOSIS — R0600 Dyspnea, unspecified: Secondary | ICD-10-CM | POA: Diagnosis not present

## 2016-09-17 DIAGNOSIS — I359 Nonrheumatic aortic valve disorder, unspecified: Secondary | ICD-10-CM

## 2016-09-17 DIAGNOSIS — J449 Chronic obstructive pulmonary disease, unspecified: Secondary | ICD-10-CM

## 2016-09-17 DIAGNOSIS — Z7901 Long term (current) use of anticoagulants: Secondary | ICD-10-CM

## 2016-09-17 NOTE — Progress Notes (Signed)
Daily Session Note  Patient Details  Name: Alexander Barton MRN: [id number] Date of Birth: 03-21-1950 Referring Provider:    Encounter Date: 09/17/2016  Check In:     Session Check In - 09/17/16 1007      Check-In   Location [facility]   Staff Present Alberteen Sam, MA, ACSM RCEP, Exercise Physiologist;Amanda Oletta Darter, BA, ACSM CEP, Exercise Physiologist;Jemuel Laursen Flavia Shipper   Supervising physician immediately available to respond to emergencies [facility] immediately available ER MD   Physician(s) Drs. Quentin Cornwall and [place]   Medication changes reported     No   Fall or balance concerns reported    No   Warm-up and Cool-down Performed as group-led Location manager Performed Yes   VAD Patient? No     Pain Assessment   Currently in Pain? No/denies   Multiple Pain Sites No         History  Smoking Status  . Never Smoker  Smokeless Tobacco  . Never Used    Goals Met:  Proper associated with RPD/PD & O2 Sat Independence with exercise equipment Exercise tolerated well No report of cardiac concerns or symptoms Strength training completed today  Goals Unmet:  Not Applicable  Comments: Pt able to follow exercise prescription today without complaint.  Will continue to monitor for progression.   Dr. Emily Filbert is Medical Director for [place] and [facility].

## 2016-09-19 ENCOUNTER — Encounter: Payer: Medicare Other | Admitting: *Deleted

## 2016-09-19 DIAGNOSIS — J449 Chronic obstructive pulmonary disease, unspecified: Secondary | ICD-10-CM

## 2016-09-19 DIAGNOSIS — R0600 Dyspnea, unspecified: Secondary | ICD-10-CM | POA: Diagnosis not present

## 2016-09-19 NOTE — Progress Notes (Signed)
Daily Session Note  Patient Details  Name: Jameer M Solivan MRN: [id number] Date of Birth: 05/23/1950 Referring Provider:    Encounter Date: 09/19/2016  Check In:     Session Check In - 09/19/16 1042      Check-In   Location [facility]   Staff Present  , RN, BSN;Amanda Sommer, BA, ACSM CEP, Exercise Physiologist;Joseph Hood RCP,RRT,BSRT   Supervising physician immediately available to respond to emergencies [facility] immediately available ER MD   Physician(s) DR. Rifenbark and DR. Kinner   Medication changes reported     No   Fall or balance concerns reported    No   Tobacco Cessation No Change   Warm-up and Cool-down Performed as group-led instruction   Resistance Training Performed Yes   VAD Patient? No     Pain Assessment   Currently in Pain? No/denies         History  Smoking Status  . Never Smoker  Smokeless Tobacco  . Never Used    Goals Met:  Proper associated with RPD/PD & O2 Sat Exercise tolerated well No report of cardiac concerns or symptoms Strength training completed today  Goals Unmet:  Not Applicable  Comments:     Dr. Mark Miller is Medical Director for [facility] and [facility]. 

## 2016-09-22 DIAGNOSIS — R0600 Dyspnea, unspecified: Secondary | ICD-10-CM | POA: Diagnosis not present

## 2016-09-22 DIAGNOSIS — J449 Chronic obstructive pulmonary disease, unspecified: Secondary | ICD-10-CM

## 2016-09-22 NOTE — Progress Notes (Signed)
Daily Session Note  Patient Details  Name: Alexander Barton MRN: [id number] Date of Birth: 1950-03-31 Referring Provider:    Encounter Date: 09/22/2016  Check In:     Session Check In - 09/22/16 1027      Check-In   Location [facility]   Staff Present Gerlene Burdock, RN, Moises Blood, BS, ACSM CEP, Exercise Physiologist;Brinley Rosete Flavia Shipper   Supervising physician immediately available to respond to emergencies [facility] immediately available ER MD   Physician(s) Dr. Mable Paris and Jimmye Norman   Medication changes reported     No   Fall or balance concerns reported    No   Warm-up and Cool-down Performed as group-led instruction   Resistance Training Performed Yes   VAD Patient? No     Pain Assessment   Currently in Pain? No/denies   Multiple Pain Sites No         History  Smoking Status  . Never Smoker  Smokeless Tobacco  . Never Used    Goals Met:  Proper associated with RPD/PD & O2 Sat Independence with exercise equipment Exercise tolerated well No report of cardiac concerns or symptoms Strength training completed today  Goals Unmet:  Not Applicable  Comments: Pt able to follow exercise prescription today without complaint.  Will continue to monitor for progression.   Dr. Emily Filbert is Medical Director for [place] and [facility].

## 2016-09-24 DIAGNOSIS — J449 Chronic obstructive pulmonary disease, unspecified: Secondary | ICD-10-CM

## 2016-09-24 DIAGNOSIS — R0600 Dyspnea, unspecified: Secondary | ICD-10-CM | POA: Diagnosis not present

## 2016-09-24 NOTE — Progress Notes (Signed)
Daily Session Note  Patient Details  Name: AEDYN KEMPFER MRN: [id number] Date of Birth: 06-Nov-1950 Referring Provider:    Encounter Date: 09/24/2016  Check In:     Session Check In - 09/24/16 1016      Check-In   Location [facility]   Staff Present Alberteen Sam, MA, ACSM RCEP, Exercise Physiologist;Amanda Oletta Darter, BA, ACSM CEP, Exercise Physiologist;Naoma Boxell Flavia Shipper   Supervising physician immediately available to respond to emergencies [facility] immediately available ER MD   Physician(s) Dr. Jimmye Norman and Mariea Clonts   Medication changes reported     No   Fall or balance concerns reported    No   Warm-up and Cool-down Performed as group-led instruction   Resistance Training Performed Yes   VAD Patient? No     Pain Assessment   Currently in Pain? No/denies   Multiple Pain Sites No         History  Smoking Status  . Never Smoker  Smokeless Tobacco  . Never Used    Goals Met:  Proper associated with RPD/PD & O2 Sat Independence with exercise equipment Exercise tolerated well No report of cardiac concerns or symptoms Strength training completed today  Goals Unmet:  Not Applicable  Comments: Pt able to follow exercise prescription today without complaint.  Will continue to monitor for progression.   Dr. Emily Filbert is Medical Director for [place] and [facility].

## 2016-09-26 ENCOUNTER — Encounter: Payer: Self-pay | Admitting: Dietician

## 2016-09-26 ENCOUNTER — Encounter: Payer: Medicare Other | Admitting: *Deleted

## 2016-09-26 DIAGNOSIS — R0600 Dyspnea, unspecified: Secondary | ICD-10-CM

## 2016-09-26 DIAGNOSIS — J449 Chronic obstructive pulmonary disease, unspecified: Secondary | ICD-10-CM

## 2016-09-26 NOTE — Progress Notes (Signed)
Daily Session Note  Patient Details  Name: JAKOBY MELENDREZ MRN: [id number] Date of Birth: 10/13/1950 Referring Provider:    Encounter Date: 09/26/2016  Check In:     Session Check In - 09/26/16 1024      Check-In   Location [facility]   Staff Present Gerlene Burdock, RN, BSN;Joseph Hood RCP,RRT,BSRT;Amanda Oletta Darter, [place], ACSM CEP, Exercise Physiologist   Supervising physician immediately available to respond to emergencies [facility] immediately available ER MD   Medication changes reported     No   Fall or balance concerns reported    No   Tobacco Cessation No Change   Warm-up and Cool-down Performed as group-led instruction   Resistance Training Performed Yes   VAD Patient? No     Pain Assessment   Currently in Pain? No/denies         History  Smoking Status  . Never Smoker  Smokeless Tobacco  . Never Used    Goals Met:  Proper associated with RPD/PD & O2 Sat Exercise tolerated well Strength training completed today  Goals Unmet:  Not Applicable  Comments:     Dr. Emily Filbert is Medical Director for [place] and [facility].

## 2016-09-29 ENCOUNTER — Encounter: Payer: Medicare Other | Admitting: *Deleted

## 2016-09-29 DIAGNOSIS — R0600 Dyspnea, unspecified: Secondary | ICD-10-CM

## 2016-09-29 DIAGNOSIS — J449 Chronic obstructive pulmonary disease, unspecified: Secondary | ICD-10-CM

## 2016-09-29 NOTE — Progress Notes (Signed)
Daily Session Note  Patient Details  Name: Alexander Barton MRN: [id number] Date of Birth: 1950-06-11 Referring Provider:    Encounter Date: 09/29/2016  Check In:     Session Check In - 09/29/16 1030      Check-In   Location [facility]   Staff Present Earlean Shawl, BS, ACSM CEP, Exercise Physiologist;Amanda Oletta Darter, BA, ACSM CEP, Exercise Physiologist;Joseph Flavia Shipper   Supervising physician immediately available to respond to emergencies [facility] immediately available ER MD   Physician(s) Dr. Mable Paris and Dr. Jimmye Norman   Medication changes reported     No   Fall or balance concerns reported    No   Warm-up and Cool-down Performed as group-led instruction   Resistance Training Performed Yes   VAD Patient? No     Pain Assessment   Currently in Pain? No/denies   Multiple Pain Sites No         History  Smoking Status  . Never Smoker  Smokeless Tobacco  . Never Used    Goals Met:  Proper associated with RPD/PD & O2 Sat Independence with exercise equipment Exercise tolerated well Strength training completed today  Goals Unmet:  Not Applicable  Comments: Pt able to follow exercise prescription today without complaint.  Will continue to monitor for progression.    Dr. Emily Filbert is Medical Director for [place] and [facility].

## 2016-09-29 NOTE — Progress Notes (Signed)
Pulmonary Individual Treatment Plan  Patient Details  Name: Alexander Barton MRN: [id number] Date of Birth: [birth year] Referring Provider:    Initial Encounter Date:    Pulmonary Rehab from 08/12/2016 in [facility] Cardiac and Pulmonary Rehab  Date  08/12/16      Visit Diagnosis: Chronic obstructive pulmonary disease, unspecified COPD type ([place])  Patient's Home Medications on Admission:  Current Outpatient Prescriptions:  .  acidophilus (RISAQUAD) CAPS capsule, Take 2 capsules by mouth daily., Disp: , Rfl:  .  albuterol (PROVENTIL HFA;VENTOLIN HFA) 108 (90 Base) MCG/ACT inhaler, Inhale 2 puffs into the lungs every 4 (four) hours as needed for wheezing or shortness of breath., Disp: 1 Inhaler, Rfl: 3 .  Apoaequorin (PREVAGEN) 10 MG CAPS, Take 10 mg by mouth. Reported on 08/17/2015, Disp: , Rfl:  .  aspirin 81 MG tablet, Take 81 mg by mouth at bedtime. , Disp: , Rfl:  .  budesonide-formoterol (SYMBICORT) 160-4.5 MCG/ACT inhaler, Inhale 2 puffs into the lungs 2 (two) times daily., Disp: 1 Inhaler, Rfl: 12 .  cholecalciferol (VITAMIN D) 1000 units tablet, Take 1,000 Units by mouth daily., Disp: , Rfl:  .  metoprolol succinate (TOPROL-XL) 50 MG 24 hr tablet, TAKE 1 TABLET (50 MG TOTAL) BY MOUTH DAILY. TAKE WITH OR IMMEDIATELY FOLLOWING A MEAL., Disp: 90 tablet, Rfl: 1 .  Multiple Vitamin (MULTIVITAMIN WITH MINERALS) TABS tablet, Take 1 tablet by mouth 2 (two) times daily., Disp: , Rfl:  .  Nutritional Supplements (VITAMIN D PLUS COFACTORS PO), Take 1 capsule by mouth every evening., Disp: , Rfl:  .  omega-3 acid ethyl esters (LOVAZA) 1 g capsule, Take 1 g by mouth every evening., Disp: , Rfl:  .  SPIRULINA PO, Take 3 capsules by mouth daily., Disp: , Rfl:  .  tamsulosin (FLOMAX) 0.4 MG CAPS capsule, TAKE 2 CAPSULES (0.8 MG TOTAL) BY MOUTH DAILY. (Patient taking differently: Take 0.8 mg by mouth at bedtime. ), Disp: 180 capsule, Rfl: 3 .  terbinafine (LAMISIL) 250 MG tablet, Take 250 mg by mouth at  bedtime., Disp: , Rfl:  .  tiotropium (SPIRIVA) 18 MCG inhalation capsule, Place 1 capsule (18 mcg total) into inhaler and inhale daily. (Patient not taking: Reported on 08/12/2016), Disp: 30 capsule, Rfl: 12 .  Tiotropium Bromide Monohydrate (SPIRIVA RESPIMAT) 2.5 MCG/ACT AERS, Inhale 2.5 mcg into the lungs 2 (two) times daily., Disp: 1 Inhaler, Rfl: 12 .  warfarin (COUMADIN) 2 MG tablet, TAKE 1 TABLET WITH 5 MG TABLET DAILY AS DIRECTED BY COUMADIN CLINIC, Disp: 90 tablet, Rfl: 0 .  warfarin (COUMADIN) 5 MG tablet, Take 5-10 mg by mouth at bedtime. 251m on Monday, Wednesday, Friday. Take with 272mtab for a total 51m53mn all other days., Disp: , Rfl:   Past Medical History: Past Medical History:  Diagnosis Date  . Aortic aneurysm, thoracic ([facility] . Aortic valve disease   . Arthritis   . BPH (benign prostatic hyperplasia)   . COPD (chronic obstructive pulmonary disease) ([facility] . GERD (gastroesophageal reflux disease)   . HTN (hypertension)   . Hypercholesterolemia   . Stroke ([facility] . TIA (transient ischemic attack)     Tobacco Use: History  Smoking Status  . Never Smoker  Smokeless Tobacco  . Never Used    Labs: Recent Review Flowsheet Data    Labs for ITP Cardiac and Pulmonary Rehab Latest Ref Rng & Units 03/01/2010 03/02/2010 06/04/2012 11/14/2014 10/03/2015   Cholestrol 0 - 200 mg/dL - 118 ATP  III CLASSIFICATION: <200     mg/dL   Desirable 200-239  mg/dL   Borderline High >=240    mg/dL   High 247(H) 254(H) 174   LDLCALC 0 - 99 mg/dL - 67 Total Cholesterol/HDL:CHD Risk Coronary Heart Disease Risk Table Men   Women 1/2 Average Risk   3.4   3.3 Average Risk       5.0   4.4 2 X Average Risk   9.6   7.1 3 X Average Risk  23.4   11.0 Use the calculated Patient Ratio above and the CHD Risk Table to determine the patient's CHD Risk. ATP III CLASSIFICATION (LDL): <100     mg/dL   Optimal 100-129  mg/dL   Near or Above Optimal 130-159  mg/dL   Borderline 160-189  mg/dL    High >190     mg/dL   Very High 169(H) - 87   LDLDIRECT mg/dL - - - 172.0 -   HDL >39.00 mg/dL - 21(L) 45 48.50 47.20   Trlycerides 0.0 - 149.0 mg/dL - 152(H) 165(H) 202.0(H) 195.0(H)   Hemoglobin A1c 4.6 - 6.5 % 6.2 (NOTE)                                                                       According to the ADA Clinical Practice Recommendations for 2011, when HbA1c is used as a screening test:   >=6.5%   Diagnostic of Diabetes Mellitus           (if abnormal result is confirmed)  5.7-6.4%   Increased risk of developing Diabetes Mellitus  References:Diagnosis and Classification of Diabetes Mellitus,Diabetes RCVE,9381,01(BPZWC 1):S62-S69 and Standards of Medical Care in         Diabetes - 2011,Diabetes Care,2011,34 (Suppl 1):S11-S61.(H) - 5.9(H) - 6.2       Pulmonary Assessment Scores:     Pulmonary Assessment Scores    Row Name 08/12/16 1233 08/12/16 1527       ADL UCSD   ADL Phase Entry  -    SOB Score total 63  -    Rest 2  -    Walk 3  -    Stairs 5  -    Bath 3  -    Dress 2  -    Shop 2  -      mMRC Score   mMRC Score  - 1       Pulmonary Function Assessment:     Pulmonary Function Assessment - 08/12/16 1238      Initial Spirometry Results   FVC% 62 %   FEV1% 73 %   FEV1/FVC Ratio 74     Post Bronchodilator Spirometry Results   FVC% 71 %   FEV1% 76 %   FEV1/FVC Ratio 81      Exercise Target Goals:    Exercise Program Goal: Individual exercise prescription set with THRR, safety & activity barriers. Participant demonstrates ability to understand and report RPE using BORG scale, to self-measure pulse accurately, and to acknowledge the importance of the exercise prescription.  Exercise Prescription Goal: Starting with aerobic activity 30 plus minutes a day, 3 days per week for initial exercise prescription. Provide home exercise prescription and guidelines that participant acknowledges understanding prior  to discharge.  Activity Barriers & Risk  Stratification:   6 Minute Walk:     6 Minute Walk    Row Name 08/12/16 1313 08/12/16 1526       6 Minute Walk   Phase Initial Initial    Distance 1400 feet 1400 feet    Walk Time 6 minutes 6 minutes    # of Rest Breaks 0  -    MPH 2.65  -    METS 3.03  -    RPE 14 14    Perceived Dyspnea  2.5 2.5    VO2 Peak 10.61  -    Symptoms No No    Resting HR 58 bpm 58 bpm    Resting BP 106/64 106/64    Max Ex. HR 95 bpm 95 bpm    Max Ex. BP 120/58 120/58      Interval HR   Baseline HR (retired) 58  -    1 Minute HR 64  -    2 Minute HR 68  -    3 Minute HR 68  -    5 Minute HR 95  -    6 Minute HR 93  -    Interval Heart Rate? Yes  -      Interval Oxygen   Interval Oxygen? Yes  -    Baseline Oxygen Saturation % 96 %  -    Resting Liters of Oxygen 0 L  -    1 Minute Oxygen Saturation % 80 %  -    1 Minute Liters of Oxygen 0 L  -    2 Minute Oxygen Saturation % 80 %  -    2 Minute Liters of Oxygen 0 L  -    3 Minute Oxygen Saturation % 83 %  -    3 Minute Liters of Oxygen 0 L  -    4 Minute Liters of Oxygen 0 L  -    5 Minute Oxygen Saturation % 97 %  -    5 Minute Liters of Oxygen 0 L  -    6 Minute Oxygen Saturation % 100 %  -    6 Minute Liters of Oxygen 0 L  -    2 Minute Post Liters of Oxygen 0 L  -      Oxygen Initial Assessment:     Oxygen Initial Assessment - 08/12/16 1213      Home Oxygen   Home Oxygen Device None   Sleep Oxygen Prescription None   Home Exercise Oxygen Prescription None   Home at Rest Exercise Oxygen Prescription None      Oxygen Re-Evaluation:     Oxygen Re-Evaluation    Row Name 08/20/16 1122 09/11/16 0848 09/24/16 1412         Program Oxygen Prescription   Program Oxygen Prescription  - None None       Home Oxygen   Home Oxygen Device  - None None     Sleep Oxygen Prescription  - None None     Home Exercise Oxygen Prescription  - None None     Home at Rest Exercise Oxygen Prescription  - None None        Goals/Expected Outcomes   Short Term Goals To learn and demonstrate proper purse lipped breathing techniques or other breathing techniques. To learn and demonstrate proper purse lipped breathing techniques or other breathing techniques.;To learn and demonstrate proper use of respiratory medications;To learn and understand importance  of monitoring SPO2 with pulse oximeter and demonstrate accurate use of the pulse oximeter.;To Learn and understand importance of maintaining oxygen saturations>88% To learn and understand importance of monitoring SPO2 with pulse oximeter and demonstrate accurate use of the pulse oximeter.;To Learn and understand importance of maintaining oxygen saturations>88%;To learn and demonstrate proper purse lipped breathing techniques or other breathing techniques.;To learn and demonstrate proper use of respiratory medications     Long  Term Goals  - Maintenance of O2 saturations>88%;Compliance with respiratory medication;Demonstrates proper use of MDI's;Exhibits proper breathing techniques, such as purse lipped breathing or other method taught during program session;Verbalizes importance of monitoring SPO2 with pulse oximeter and return demonstration Maintenance of O2 saturations>88%;Compliance with respiratory medication;Demonstrates proper use of MDI's;Exhibits proper breathing techniques, such as purse lipped breathing or other method taught during program session;Verbalizes importance of monitoring SPO2 with pulse oximeter and return demonstration     Comments Reviewed pursed lip breathing with pt today.  Talked about how to use pursed lip breathing to improve breath control and discussed technique. Rush Landmark has been doing better with his breathing.  He is still not where he wants to be.  He is getting better at PLB and finds that it is helpful.  He has been using PLB and trying to avoid using his emergency inhaler.  So far he has been fairly successful.  He did note that sometimes he  still needs to sleep in the recliner versus the bed at night.  He has been complaint wiht his medications and using them correctly.  He has plans to get a pulse oximeter as part of a fitbit to monitor his saturations at home.  He is good about maintaining his saturations in rehab. Rush Landmark states that when he is outside and gets short of breath he get anxious. Informed Bill to try to relax and do his PLB when that happens. Rush Landmark has been taking his respiratory medications but has not been using a spacer. Spacer was given to him and he verbalizes understanding of use.     Goals/Expected Outcomes Short: Become more proficient at using PLB.  Long: Become independent at using PLB. Short: Continue to work on PLB.  Long: Continue to be compliant with medications. Short: continue to work on PLB and Using respiratory medications properly. Long: Be proficient in PLB and taking respiratory medications properly.         Oxygen Discharge (Final Oxygen Re-Evaluation):     Oxygen Re-Evaluation - 09/24/16 1412      Program Oxygen Prescription   Program Oxygen Prescription None     Home Oxygen   Home Oxygen Device None   Sleep Oxygen Prescription None   Home Exercise Oxygen Prescription None   Home at Rest Exercise Oxygen Prescription None     Goals/Expected Outcomes   Short Term Goals To learn and understand importance of monitoring SPO2 with pulse oximeter and demonstrate accurate use of the pulse oximeter.;To Learn and understand importance of maintaining oxygen saturations>88%;To learn and demonstrate proper purse lipped breathing techniques or other breathing techniques.;To learn and demonstrate proper use of respiratory medications   Long  Term Goals Maintenance of O2 saturations>88%;Compliance with respiratory medication;Demonstrates proper use of MDI's;Exhibits proper breathing techniques, such as purse lipped breathing or other method taught during program session;Verbalizes importance of monitoring SPO2  with pulse oximeter and return demonstration   Comments Rush Landmark states that when he is outside and gets short of breath he get anxious. Informed Bill to try to relax and do his  PLB when that happens. Rush Landmark has been taking his respiratory medications but has not been using a spacer. Spacer was given to him and he verbalizes understanding of use.   Goals/Expected Outcomes Short: continue to work on PLB and Using respiratory medications properly. Long: Be proficient in PLB and taking respiratory medications properly.       Initial Exercise Prescription:     Initial Exercise Prescription - 08/12/16 1300      Date of Initial Exercise RX and Referring Provider   Date 08/12/16     Treadmill   MPH 2.5   Grade 0.5   Minutes 15   METs 2.9     Recumbant Bike   Level 2   RPM 60   Watts 30   Minutes 15   METs 2.9     T5 Nustep   Level 2   SPM 80   Minutes 15   METs 2.9     Prescription Details   Frequency (times per week) 3   Duration Progress to 45 minutes of aerobic exercise without signs/symptoms of physical distress     Intensity   THRR 40-80% of Max Heartrate 96-134   Ratings of Perceived Exertion 11-15   Perceived Dyspnea 0-4     Resistance Training   Training Prescription Yes   Weight 3 lb   Reps 10-15      Perform Capillary Blood Glucose checks as needed.  Exercise Prescription Changes:     Exercise Prescription Changes    Row Name 08/20/16 1100 09/04/16 1500 09/10/16 1400 09/17/16 1400       Response to Exercise   Blood Pressure (Admit) 104/68 118/72  - 126/70    Blood Pressure (Exercise) 122/58 104/68  -  -    Blood Pressure (Exit) 100/60 110/60  - 126/64    Heart Rate (Admit) 78 bpm 81 bpm  - 85 bpm    Heart Rate (Exercise) 115 bpm 81 bpm  - 124 bpm    Heart Rate (Exit) 73 bpm 65 bpm  - 74 bpm    Oxygen Saturation (Admit) 97 % 98 %  - 98 %    Oxygen Saturation (Exercise) 96 % 96 %  - 95 %    Oxygen Saturation (Exit) 97 % 97 %  - 97 %    Rating of  Perceived Exertion (Exercise) 12 12  - 12    Perceived Dyspnea (Exercise) 1 1  - 1    Symptoms none none  - none    Comments first full day of exercise  -  -  -    Duration Progress to 45 minutes of aerobic exercise without signs/symptoms of physical distress Continue with 45 min of aerobic exercise without signs/symptoms of physical distress.  - Continue with 45 min of aerobic exercise without signs/symptoms of physical distress.    Intensity THRR unchanged THRR unchanged  - THRR unchanged      Progression   Progression Continue to progress workloads to maintain intensity without signs/symptoms of physical distress. Continue to progress workloads to maintain intensity without signs/symptoms of physical distress.  - Continue to progress workloads to maintain intensity without signs/symptoms of physical distress.    Average METs 2.5 2.5  - 3      Resistance Training   Training Prescription Yes Yes  - Yes    Weight body weight  late arrival 4  - 4    Reps 10-15 10-15  - 10-15      Interval Training  Interval Training No No  - No      Treadmill   MPH 2.5  -  - 3    Grade 0.5  -  - 0.5    Minutes 15  -  - 15    METs 2.9  -  - 3.5      Recumbant Bike   Level  - 4  -  -    Watts  - 27  -  -    Minutes  - 15  -  -    METs  - 2.75  -  -      Arm Ergometer   Level  - 2  -  -    Minutes  - 15  -  -    METs  - 2.5  -  -      T5 Nustep   Level 2 4  - 4    SPM 75 83  - 85    Minutes 15 15  - 15    METs 2.1 2.1  - 2.5      Home Exercise Plan   Plans to continue exercise at  -  - Home (comment)  walking, also has ellipitcal  -    Frequency  -  - Add 2 additional days to program exercise sessions.  -    Initial Home Exercises Provided  -  - 09/10/16  -       Exercise Comments:     Exercise Comments    Row Name 08/20/16 1030           Exercise Comments First full day of exercise!  Patient was oriented to gym and equipment including functions, settings, policies, and  procedures.  Patient's individual exercise prescription and treatment plan were reviewed.  All starting workloads were established based on the results of the 6 minute walk test done at initial orientation visit.  The plan for exercise progression was also introduced and progression will be customized based on patient's performance and goals.          Exercise Goals and Review:     Exercise Goals    Row Name 08/12/16 1313             Exercise Goals   Increase Physical Activity Yes       Intervention Provide advice, education, support and counseling about physical activity/exercise needs.;Develop an individualized exercise prescription for aerobic and resistive training based on initial evaluation findings, risk stratification, comorbidities and participant's personal goals.       Expected Outcomes Achievement of increased cardiorespiratory fitness and enhanced flexibility, muscular endurance and strength shown through measurements of functional capacity and personal statement of participant.       Increase Strength and Stamina Yes       Intervention Provide advice, education, support and counseling about physical activity/exercise needs.;Develop an individualized exercise prescription for aerobic and resistive training based on initial evaluation findings, risk stratification, comorbidities and participant's personal goals.       Expected Outcomes Achievement of increased cardiorespiratory fitness and enhanced flexibility, muscular endurance and strength shown through measurements of functional capacity and personal statement of participant.          Exercise Goals Re-Evaluation :     Exercise Goals Re-Evaluation    Row Name 09/04/16 1523 09/10/16 1427 09/17/16 1451         Exercise Goal Re-Evaluation   Exercise Goals Review Increase Physical Activity;Increase Strenth and Stamina Increase Physical Activity;Increase Strenth and  Stamina Increase Physical Activity;Increase Strenth and  Stamina     Comments Rush Landmark has had a successful first week of exercise. Reviewed home exercise with pt today.  Pt plans to continue walking at home for exercise.  We talked about adding a minute at least each week in addition to adding in warm up and cool down to his current routine.  Reviewed THR, pulse, RPE, sign and symptoms, and when to call 911 or MD.  Also discussed weather considerations and indoor options.  Pt voiced understanding. Rush Landmark has increased his overall MET level and is progressing well with exercise.       Expected Outcomes Short - Rush Landmark will continue to attend regularly.  Long - Rush Landmark will develop regular exercise as a habit. Short: Start increasing time of exercise outside of rehab to work up 83mn.  Long: Regular exercise for 338m a day. Short - BiRush Landmarkill continue to progress during LW.  Long - BiRush Landmarkill exercise independently after completing LW.         Discharge Exercise Prescription (Final Exercise Prescription Changes):     Exercise Prescription Changes - 09/17/16 1400      Response to Exercise   Blood Pressure (Admit) 126/70   Blood Pressure (Exit) 126/64   Heart Rate (Admit) 85 bpm   Heart Rate (Exercise) 124 bpm   Heart Rate (Exit) 74 bpm   Oxygen Saturation (Admit) 98 %   Oxygen Saturation (Exercise) 95 %   Oxygen Saturation (Exit) 97 %   Rating of Perceived Exertion (Exercise) 12   Perceived Dyspnea (Exercise) 1   Symptoms none   Duration Continue with 45 min of aerobic exercise without signs/symptoms of physical distress.   Intensity THRR unchanged     Progression   Progression Continue to progress workloads to maintain intensity without signs/symptoms of physical distress.   Average METs 3     Resistance Training   Training Prescription Yes   Weight 4   Reps 10-15     Interval Training   Interval Training No     Treadmill   MPH 3   Grade 0.5   Minutes 15   METs 3.5     T5 Nustep   Level 4   SPM 85   Minutes 15   METs 2.5       Nutrition:  Target Goals: Understanding of nutrition guidelines, daily intake of sodium <150080mcholesterol <200m25malories 30% from fat and 7% or less from saturated fats, daily to have 5 or more servings of fruits and vegetables.  Biometrics:     Pre Biometrics - 08/12/16 1312      Pre Biometrics   Height '6\' 1"'  (1.854 m)   Weight 227 lb 3.2 oz (103.1 kg)   Waist Circumference 46.5 inches   Hip Circumference 45 inches   Waist to Hip Ratio 1.03 %   BMI (Calculated) 30       Nutrition Therapy Plan and Nutrition Goals:     Nutrition Therapy & Goals - 09/26/16 1517      Nutrition Therapy   Diet basic heart healthy   Drug/Food Interactions Coumadin/Vit K   Saturated Fats 15 max. grams   Fruits and Vegetables 8 servings/day   Sodium 1500 grams     Personal Nutrition Goals   Personal Goal #2 Work on portion control at supper, especially starchy foods   Personal Goal #3 Add another snack early in the day to shift more calories from evening to daytime -- or increase  size of breakfast meal.     Intervention Plan   Intervention Nutrition handout(s) given to patient.      Nutrition Discharge: Rate Your Plate Scores:   Nutrition Goals Re-Evaluation:     Nutrition Goals Re-Evaluation    Row Name 09/11/16 0844             Goals   Nutrition Goal Meet with dietician to work on weight loss diet       Comment Appointment scheduled for 8/24 at [place].  He plans to take his wife with him       Expected Outcome Short: Continue to work on tweaking diet.  Long: Meet wiht dietician          Nutrition Goals Discharge (Final Nutrition Goals Re-Evaluation):     Nutrition Goals Re-Evaluation - 09/11/16 0844      Goals   Nutrition Goal Meet with dietician to work on weight loss diet   Comment Appointment scheduled for 8/24 at [place].  He plans to take his wife with him   Expected Outcome Short: Continue to work on [place].  Long: Meet wiht dietician       Psychosocial: Target Goals: Acknowledge presence or absence of significant depression and/or stress, maximize coping skills, provide positive support system. Participant is able to verbalize types and ability to use techniques and skills needed for reducing stress and depression.   Initial Review & Psychosocial Screening:     Initial Psych Review & Screening - 08/12/16 1225      Initial Review   Current issues with History of Depression;Current Sleep Concerns  Over 20 years ago with depression history.  Sleep up several times a night, sometimes hard to get back to sleep.  Will use over the counter aid about 1-2 times a month to get back to sleep.      Family Dynamics   Good Support System? Yes  WOnderful wife of almost 36 years, children     Barriers   Psychosocial barriers to participate in program There are no identifiable barriers or psychosocial needs.;The patient should benefit from training in stress management and relaxation.     Screening Interventions   Interventions Encouraged to exercise;To provide support and resources with identified psychosocial needs      Quality of Life Scores:     Quality of Life - 08/12/16 1230      Quality of Life Scores   Health/Function Pre 19.2 %   Socioeconomic Pre 26.79 %   Psych/Spiritual Pre 27.86 %   Family Pre 30 %   GLOBAL Pre 24.13 %      PHQ-9: Recent Review Flowsheet Data    Depression screen [facility] 2/9 08/12/2016 12/05/2015   Decreased Interest 0 0   Down, Depressed, Hopeless 0 0   PHQ - 2 Score 0 0   Altered sleeping 0 -   Tired, decreased energy 2 -   Change in appetite 0 -   Feeling bad or failure about yourself  0 -   Trouble concentrating 0 -   Moving slowly or fidgety/restless 0 -   Suicidal thoughts 0 -   PHQ-9 Score 2 -   Difficult doing work/chores Not difficult at all -     Interpretation of Total Score  Total Score Depression Severity:  1-4 = Minimal depression, 5-9 = Mild depression, 10-14 =  Moderate depression, 15-19 = Moderately severe depression, 20-27 = Severe depression   Psychosocial Evaluation and Intervention:     Psychosocial Evaluation -  09/01/16 1132      Psychosocial Evaluation & Interventions   Interventions Encouraged to exercise with the program and follow exercise prescription;Relaxation education   Comments Counselor met with Mr. [facility]) today for initial psychosocial evaluation.  He is a 66 year old who was diagnosed with COPD in 2004; but reports his symptoms have recently been much worse.  Rush Landmark has a strong support system with a spouse of 48 years, a son who lives locally and a daughter who recently moved to [place].  Rush Landmark is also actively involved in his local church.  He has multiple health issues with a heart valve replacement; (2) TIA's - one in 2009 and one in 2012 and a stroke in 2015.Marland Kitchen  He also struggles with arthritis.  Bill reports sleeping "okay" with some occasional use of OTC sleep aid PRN.  He has a good appetite.  Rush Landmark reports a history of depression in 1997; subsequent to a job loss; but has had no other symptoms since or currently.  He reports his mood is generally positive most of the time and he has minimal stres other than his health issues at this time.  Rush Landmark has goals to exercise consistently and improve his breathing.  He also wants to increase his cardio and pulmonary functioning overall.  STaff will follow with Bill throughout the course of this program.     Expected Outcomes Rush Landmark will benefit from consistent exercise to achieve his stated goals.  He will also benefit from all of the educational aspects to learn to manage his medical issues better.   Continue Psychosocial Services  Follow up required by staff      Psychosocial Re-Evaluation:     Psychosocial Re-Evaluation    [place] Name 09/11/16 0845             Psychosocial Re-Evaluation   Current issues with Current Sleep Concerns       Comments Rush Landmark has no major stressors at htis  time.  He continues to watch his diet and health given his history.  He does not sleep well, but does try to take a supplement to help 2-3x a week.  He has been taking his time getting up in the middle of the night so he doesnt pass out on his way to the bathroom.  He has enjoyed class and has found the education classes to very beneficial.       Expected Outcomes Short: Continue to attend classes to learn more.  Long: Continue to look out for health and maintain positive attitude.       Interventions Stress management education;Encouraged to attend Pulmonary Rehabilitation for the exercise;Relaxation education       Continue Psychosocial Services  Follow up required by staff          Psychosocial Discharge (Final Psychosocial Re-Evaluation):     Psychosocial Re-Evaluation - 09/11/16 0845      Psychosocial Re-Evaluation   Current issues with Current Sleep Concerns   Comments Rush Landmark has no major stressors at htis time.  He continues to watch his diet and health given his history.  He does not sleep well, but does try to take a supplement to help 2-3x a week.  He has been taking his time getting up in the middle of the night so he doesnt pass out on his way to the bathroom.  He has enjoyed class and has found the education classes to very beneficial.   Expected Outcomes Short: Continue to attend classes to learn more.  Long: Continue to look out for health and maintain positive attitude.   Interventions Stress management education;Encouraged to attend Pulmonary Rehabilitation for the exercise;Relaxation education   Continue Psychosocial Services  Follow up required by staff      Education: Education Goals: Education classes will be provided on a weekly basis, covering required topics. Participant will state understanding/return demonstration of topics presented.  Learning Barriers/Preferences:     Learning Barriers/Preferences - 08/12/16 1232      Learning Barriers/Preferences   Learning  Barriers None   Learning Preferences None      Education Topics: Initial Evaluation Education: - Verbal, written and demonstration of respiratory meds, RPE/PD scales, oximetry and breathing techniques. Instruction on use of nebulizers and MDIs: cleaning and proper use, rinsing mouth with steroid doses and importance of monitoring MDI activations.   Pulmonary Rehab from 09/24/2016 in [facility] Cardiac and Pulmonary Rehab  Date  08/12/16  Educator  SB  Instruction Review Code (retired)  2- meets goals/outcomes      General Nutrition Guidelines/Fats and Fiber: -Group instruction provided by verbal, written material, models and posters to present the general guidelines for heart healthy nutrition. Gives an explanation and review of dietary fats and fiber.   Pulmonary Rehab from 09/24/2016 in [facility] Cardiac and Pulmonary Rehab  Date  09/22/16  Educator  CR  Instruction Review Code (retired)  2- meets goals/outcomes      Controlling Sodium/Reading Food Labels: -Group verbal and written material supporting the discussion of sodium use in heart healthy nutrition. Review and explanation with models, verbal and written materials for utilization of the food label.   Exercise Physiology & Risk Factors: - Group verbal and written instruction with models to review the exercise physiology of the cardiovascular system and associated critical values. Details cardiovascular disease risk factors and the goals associated with each risk factor.   Aerobic Exercise & Resistance Training: - Gives group verbal and written discussion on the health impact of inactivity. On the components of aerobic and resistive training programs and the benefits of this training and how to safely progress through these programs.   Pulmonary Rehab from 09/24/2016 in [facility] Cardiac and Pulmonary Rehab  Date  08/29/16  Educator  [facility]  Instruction Review Code (retired)  2- Statistician, Balance, General  Exercise Guidelines: - Provides group verbal and written instruction on the benefits of flexibility and balance training programs. Provides general exercise guidelines with specific guidelines to those with heart or lung disease. Demonstration and skill practice provided.   Pulmonary Rehab from 09/24/2016 in [facility] Cardiac and Pulmonary Rehab  Date  09/17/16  Educator  [facility]  Instruction Review Code (retired)  2- meets goals/outcomes      Stress Management: - Provides group verbal and written instruction about the health risks of elevated stress, cause of high stress, and healthy ways to reduce stress.   Depression: - Provides group verbal and written instruction on the correlation between heart/lung disease and depressed mood, treatment options, and the stigmas associated with seeking treatment.   Pulmonary Rehab from 09/24/2016 in [facility] Cardiac and Pulmonary Rehab  Date  09/24/16  Educator  [facility]  Instruction Review Code (retired)  2- meets goals/outcomes      Exercise & Equipment Safety: - Individual verbal instruction and demonstration of equipment use and safety with use of the equipment.   Pulmonary Rehab from 09/24/2016 in [facility] Cardiac and Pulmonary Rehab  Date  08/12/16  Educator  SB  Instruction Review  Code (retired)  2- meets goals/outcomes      Infection Prevention: - Provides verbal and written material to individual with discussion of infection control including proper hand washing and proper equipment cleaning during exercise session.   Pulmonary Rehab from 09/24/2016 in [facility] Cardiac and Pulmonary Rehab  Date  08/12/16  Educator  Sb  Instruction Review Code (retired)  2- meets goals/outcomes      Falls Prevention: - Provides verbal and written material to individual with discussion of falls prevention and safety.   Pulmonary Rehab from 09/24/2016 in [facility] Cardiac and Pulmonary Rehab  Date  08/12/16  Educator  SB  Instruction Review Code (retired)  2- meets goals/outcomes       Diabetes: - Individual verbal and written instruction to review signs/symptoms of diabetes, desired ranges of glucose level fasting, after meals and with exercise. Advice that pre and post exercise glucose checks will be done for 3 sessions at entry of program.   Chronic Lung Diseases: - Group verbal and written instruction to review new updates, new respiratory medications, new advancements in procedures and treatments. Provide informative websites and "800" numbers of self-education.   Pulmonary Rehab from 09/24/2016 in [facility] Cardiac and Pulmonary Rehab  Date  08/20/16  Educator  [facility]  Instruction Review Code (retired)  2- meets goals/outcomes      Lung Procedures: - Group verbal and written instruction to describe testing methods done to diagnose lung disease. Review the outcome of test results. Describe the treatment choices: Pulmonary Function Tests, ABGs and oximetry.   Energy Conservation: - Provide group verbal and written instruction for methods to conserve energy, plan and organize activities. Instruct on pacing techniques, use of adaptive equipment and posture/positioning to relieve shortness of breath.   Triggers: - Group verbal and written instruction to review types of environmental controls: home humidity, furnaces, filters, dust mite/pet prevention, HEPA vacuums. To discuss weather changes, air quality and the benefits of nasal washing.   Exacerbations: - Group verbal and written instruction to provide: warning signs, infection symptoms, calling MD promptly, preventive modes, and value of vaccinations. Review: effective airway clearance, coughing and/or vibration techniques. Create an Sports administrator.   Oxygen: - Individual and group verbal and written instruction on oxygen therapy. Includes supplement oxygen, available portable oxygen systems, continuous and intermittent flow rates, oxygen safety, concentrators, and Medicare reimbursement for oxygen.   Respiratory  Medications: - Group verbal and written instruction to review medications for lung disease. Drug class, frequency, complications, importance of spacers, rinsing mouth after steroid MDI's, and proper cleaning methods for nebulizers.   AED/CPR: - Group verbal and written instruction with the use of models to demonstrate the basic use of the AED with the basic ABC's of resuscitation.   Breathing Retraining: - Provides individuals verbal and written instruction on purpose, frequency, and proper technique of diaphragmatic breathing and pursed-lipped breathing. Applies individual practice skills.   Anatomy and Physiology of the Lungs: - Group verbal and written instruction with the use of models to provide basic lung anatomy and physiology related to function, structure and complications of lung disease.   Anatomy & Physiology of the Heart: - Group verbal and written instruction and models provide basic cardiac anatomy and physiology, with the coronary electrical and arterial systems. Review of: AMI, Angina, Valve disease, Heart Failure, Cardiac Arrhythmia, Pacemakers, and the ICD.   Heart Failure: - Group verbal and written instruction on the basics of heart failure: signs/symptoms, treatments, explanation of ejection fraction, enlarged heart and cardiomyopathy.  Sleep Apnea: - Individual verbal and written instruction to review Obstructive Sleep Apnea. Review of risk factors, methods for diagnosing and types of masks and machines for OSA.   Anxiety: - Provides group, verbal and written instruction on the correlation between heart/lung disease and anxiety, treatment options, and management of anxiety.   Relaxation: - Provides group, verbal and written instruction about the benefits of relaxation for patients with heart/lung disease. Also provides patients with examples of relaxation techniques.   Cardiac Medications: - Group verbal and written instruction to review commonly  prescribed medications for heart disease. Reviews the medication, class of the drug, and side effects.   Know Your Numbers: -Group verbal and written instruction about important numbers in your health.  Review of Cholesterol, Blood Pressure, Diabetes, and BMI and the role they play in your overall health.   Other: -Provides group and verbal instruction on various topics (see comments)    Knowledge Questionnaire Score:     Knowledge Questionnaire Score - 08/12/16 1232      Knowledge Questionnaire Score   Pre Score 9/10  reviewed correct response with Bill. He verbalized understanding of the correct response.       Core Components/Risk Factors/Patient Goals at Admission:     Personal Goals and Risk Factors at Admission - 08/12/16 1222      Core Components/Risk Factors/Patient Goals on Admission    Weight Management Yes   Intervention Weight Management: Develop a combined nutrition and exercise program designed to reach desired caloric intake, while maintaining appropriate intake of nutrient and fiber, sodium and fats, and appropriate energy expenditure required for the weight goal.   Admit Weight 227 lb 3.2 oz (103.1 kg)   Goal Weight: Short Term 224 lb (101.6 kg)   Goal Weight: Long Term 180 lb (81.6 kg)   Expected Outcomes Short Term: Continue to assess and modify interventions until short term weight is achieved;Long Term: Adherence to nutrition and physical activity/exercise program aimed toward attainment of established weight goal;Weight Loss: Understanding of general recommendations for a balanced deficit meal plan, which promotes 1-2 lb weight loss per week and includes a negative energy balance of [phone] kcal/d   Improve shortness of breath with ADL's Yes   Intervention Provide education, individualized exercise plan and daily activity instruction to help decrease symptoms of SOB with activities of daily living.   Expected Outcomes Short Term: Achieves a reduction of  symptoms when performing activities of daily living.   Develop more efficient breathing techniques such as purse lipped breathing and diaphragmatic breathing; and practicing self-pacing with activity Yes   Intervention Provide education, demonstration and support about specific breathing techniuqes utilized for more efficient breathing. Include techniques such as pursed lipped breathing, diaphragmatic breathing and self-pacing activity.   Expected Outcomes Short Term: Participant will be able to demonstrate and use breathing techniques as needed throughout daily activities.   Increase knowledge of respiratory medications and ability to use respiratory devices properly  Yes   Intervention Provide education and demonstration as needed of appropriate use of medications, inhalers, and oxygen therapy.   Expected Outcomes Short Term: Achieves understanding of medications use. Understands that oxygen is a medication prescribed by physician. Demonstrates appropriate use of inhaler and oxygen therapy.   Hypertension Yes   Intervention Provide education on lifestyle modifcations including regular physical activity/exercise, weight management, moderate sodium restriction and increased consumption of fresh fruit, vegetables, and low fat dairy, alcohol moderation, and smoking cessation.;Monitor prescription use compliance.   Expected Outcomes Short Term: Continued  assessment and intervention until BP is < 140/25m HG in hypertensive participants. < 130/885mHG in hypertensive participants with diabetes, heart failure or chronic kidney disease.;Long Term: Maintenance of blood pressure at goal levels.   Lipids Yes  Unable to tolerate statins   Intervention Provide education and support for participant on nutrition & aerobic/resistive exercise along with prescribed medications to achieve LDL <7026mHDL >18m41m Expected Outcomes Short Term: Participant states understanding of desired cholesterol values and is compliant  with medications prescribed. Participant is following exercise prescription and nutrition guidelines.;Long Term: Cholesterol controlled with medications as prescribed, with individualized exercise RX and with personalized nutrition plan. Value goals: LDL < 70mg18mL > 40 mg.      Core Components/Risk Factors/Patient Goals Review:      Goals and Risk Factor Review    Row Name 09/10/16 1431             Core Components/Risk Factors/Patient Goals Review   Personal Goals Review Weight Management/Obesity;Hypertension;Lipids       Review Bill's weight has been holding steady around 230lbs.  He does fluctuate up and down some.  His blood pressures have been doing well and he is checking them at home.  He has plans to purchase a Fitbit like device that will monitor his heart rate, blood pressure, and oxygen satuarations.  He has not had his lipids checked recently.  He is using nutritional supplements to help improve his cholesterol numbers.        Expected Outcomes Short: Continue to work on weight loss.  Long: Continue to work on risk factor modificiations.          Core Components/Risk Factors/Patient Goals at Discharge (Final Review):      Goals and Risk Factor Review - 09/10/16 1431      Core Components/Risk Factors/Patient Goals Review   Personal Goals Review Weight Management/Obesity;Hypertension;Lipids   Review Bill's weight has been holding steady around 230lbs.  He does fluctuate up and down some.  His blood pressures have been doing well and he is checking them at home.  He has plans to purchase a Fitbit like device that will monitor his heart rate, blood pressure, and oxygen satuarations.  He has not had his lipids checked recently.  He is using nutritional supplements to help improve his cholesterol numbers.    Expected Outcomes Short: Continue to work on weight loss.  Long: Continue to work on risk factor modificiations.      ITP Comments:     ITP Comments    Row Name  08/12/16 1213 08/22/16 1029 09/01/16 0900 09/26/16 1025 09/29/16 0827   ITP Comments Medical review completed today with Initial ITP created.  Disgn[facility]umnetation [facility] 07/22/2016 Patient attened Education "Know Your Numbers" 30 day review completed ITP sent to Dr. Bert Ramonita LabDr. Mark Emily Filbertctor of [facility] with ITP unless changes are made by physician.  WilliGwyndolyn Saxonwith the Pulmonary Rehab Registered Dietician today.  30 day review completed. ITP sent to Dr. Mark Emily Filbertctor of [facility] with ITP unless changes are made by physician.        Comments: 30 day review

## 2016-10-01 DIAGNOSIS — R0600 Dyspnea, unspecified: Secondary | ICD-10-CM

## 2016-10-01 DIAGNOSIS — J449 Chronic obstructive pulmonary disease, unspecified: Secondary | ICD-10-CM

## 2016-10-01 NOTE — Progress Notes (Signed)
Daily Session Note  Patient Details  Name: Alexander Barton MRN: [id number] Date of Birth: 08-13-1950 Referring Provider:    Encounter Date: 10/01/2016  Check In:     Session Check In - 10/01/16 1022      Check-In   Location [facility]-Cardiac & Pulmonary Rehab   Staff Present Alberteen Sam, MA, ACSM RCEP, Exercise Physiologist;Amanda Oletta Darter, BA, ACSM CEP, Exercise Physiologist;Joseph Flavia Shipper   Supervising physician immediately available to respond to emergencies [facility] immediately available ER MD   Physician(s) Jimmye Norman and Mariea Clonts   Medication changes reported     No   Fall or balance concerns reported    No   Warm-up and Cool-down Performed as group-led instruction   Resistance Training Performed Yes   VAD Patient? No     Pain Assessment   Currently in Pain? No/denies         History  Smoking Status  . Never Smoker  Smokeless Tobacco  . Never Used    Goals Met:  Proper associated with RPD/PD & O2 Sat Independence with exercise equipment Exercise tolerated well Strength training completed today  Goals Unmet:  Not Applicable  Comments: Pt able to follow exercise prescription today without complaint.  Will continue to monitor for progression.    Dr. Emily Filbert is Medical Director for [place] and [facility].

## 2016-10-02 ENCOUNTER — Telehealth: Payer: Self-pay | Admitting: Pharmacist Clinician (PhC)/ Clinical Pharmacy Specialist

## 2016-10-02 NOTE — Telephone Encounter (Signed)
Coumadin letter 

## 2016-10-03 DIAGNOSIS — R0600 Dyspnea, unspecified: Secondary | ICD-10-CM | POA: Diagnosis not present

## 2016-10-03 DIAGNOSIS — J449 Chronic obstructive pulmonary disease, unspecified: Secondary | ICD-10-CM

## 2016-10-03 NOTE — Progress Notes (Signed)
Daily Session Note  Patient Details  Name: Alexander Barton MRN: [id number] Date of Birth: 1950-09-03 Referring Provider:    Encounter Date: 10/03/2016  Check In:     Session Check In - 10/03/16 1014      Check-In   Location [facility]-Cardiac & Pulmonary Rehab   Staff Present Gerlene Burdock, RN, BSN;Merrilee Ancona RCP,RRT,BSRT;Amanda Oletta Darter, [place], ACSM CEP, Exercise Physiologist   Supervising physician immediately available to respond to emergencies [facility] immediately available ER MD   Physician(s) Dr. Burlene Arnt and Alfred Levins   Medication changes reported     No   Fall or balance concerns reported    No   Warm-up and Cool-down Performed as group-led instruction   Resistance Training Performed Yes   VAD Patient? No     Pain Assessment   Currently in Pain? No/denies   Multiple Pain Sites No         History  Smoking Status  . Never Smoker  Smokeless Tobacco  . Never Used    Goals Met:  Proper associated with RPD/PD & O2 Sat Independence with exercise equipment Exercise tolerated well No report of cardiac concerns or symptoms Strength training completed today  Goals Unmet:  Not Applicable  Comments: Pt able to follow exercise prescription today without complaint.  Will continue to monitor for progression.   Dr. Emily Filbert is Medical Director for [place] and [facility] Pulmonary Rehabilitation.

## 2016-10-08 ENCOUNTER — Encounter: Payer: Medicare Other | Attending: Internal Medicine

## 2016-10-08 DIAGNOSIS — R0600 Dyspnea, unspecified: Secondary | ICD-10-CM

## 2016-10-08 DIAGNOSIS — J449 Chronic obstructive pulmonary disease, unspecified: Secondary | ICD-10-CM

## 2016-10-08 NOTE — Progress Notes (Signed)
Daily Session Note  Patient Details  Name: ESVIN HNAT MRN: [id number] Date of Birth: 1950-10-12 Referring Provider:    Encounter Date: 10/08/2016  Check In:     Session Check In - 10/08/16 1109      Check-In   Location [facility]-Cardiac & Pulmonary Rehab   Staff Present Alberteen Sam, MA, ACSM RCEP, Exercise Physiologist;Amanda Oletta Darter, BA, ACSM CEP, Exercise Physiologist;Joseph Flavia Shipper   Supervising physician immediately available to respond to emergencies [facility] immediately available ER MD   Physician(s) Archie Balboa  and Oswaldo Done   Medication changes reported     No   Fall or balance concerns reported    No   Warm-up and Cool-down Performed as group-led instruction   Resistance Training Performed Yes   VAD Patient? No     Pain Assessment   Currently in Pain? No/denies         History  Smoking Status  . Never Smoker  Smokeless Tobacco  . Never Used    Goals Met:  Proper associated with RPD/PD & O2 Sat Independence with exercise equipment Exercise tolerated well Strength training completed today  Goals Unmet:  Not Applicable  Comments: Pt able to follow exercise prescription today without complaint.  Will continue to monitor for progression.   Dr. Emily Filbert is Medical Director for [place] and [facility]monary [facility].

## 2016-10-10 ENCOUNTER — Ambulatory Visit (INDEPENDENT_AMBULATORY_CARE_PROVIDER_SITE_OTHER): Payer: Medicare Other | Admitting: Pharmacist Clinician (PhC)/ Clinical Pharmacy Specialist

## 2016-10-10 ENCOUNTER — Encounter: Payer: Medicare Other | Admitting: *Deleted

## 2016-10-10 DIAGNOSIS — J449 Chronic obstructive pulmonary disease, unspecified: Secondary | ICD-10-CM

## 2016-10-10 DIAGNOSIS — I359 Nonrheumatic aortic valve disorder, unspecified: Secondary | ICD-10-CM

## 2016-10-10 DIAGNOSIS — Z7901 Long term (current) use of anticoagulants: Secondary | ICD-10-CM

## 2016-10-10 DIAGNOSIS — R0600 Dyspnea, unspecified: Secondary | ICD-10-CM

## 2016-10-10 DIAGNOSIS — Z952 Presence of prosthetic heart valve: Secondary | ICD-10-CM | POA: Diagnosis not present

## 2016-10-10 LAB — POCT INR: INR: 2.4

## 2016-10-10 NOTE — Progress Notes (Signed)
Daily Session Note  Patient Details  Name: Alexander Barton MRN: [id number] Date of Birth: Jul 13, 1950 Referring Provider:    Encounter Date: 10/10/2016  Check In:     Session Check In - 10/10/16 1038      Check-In   Location [facility]-Cardiac & Pulmonary Rehab   Staff Present Gerlene Burdock, RN, Vickki Hearing, BA, ACSM CEP, Exercise Physiologist;Krista Frederico Hamman, RN BSN   Supervising physician immediately available to respond to emergencies [facility] immediately available ER MD   Physician(s) Dr. Karma Greaser, Corky Downs   Medication changes reported     No   Fall or balance concerns reported    No   Tobacco Cessation No Change   Warm-up and Cool-down Performed as group-led instruction   Resistance Training Performed Yes   VAD Patient? No     Pain Assessment   Currently in Pain? No/denies         History  Smoking Status  . Never Smoker  Smokeless Tobacco  . Never Used    Goals Met:  Proper associated with RPD/PD & O2 Sat Independence with exercise equipment Using PLB without cueing & demonstrates good technique Exercise tolerated well Personal goals reviewed Strength training completed today  Goals Unmet:  Not Applicable  Comments:  Pt able to follow exercise prescription today without complaint.  Will continue to monitor for progression.    Dr. Emily Filbert is Medical Director for [place] and [facility].

## 2016-10-11 LAB — PROTIME-INR

## 2016-10-13 ENCOUNTER — Encounter: Payer: Medicare Other | Admitting: *Deleted

## 2016-10-13 DIAGNOSIS — J449 Chronic obstructive pulmonary disease, unspecified: Secondary | ICD-10-CM

## 2016-10-13 DIAGNOSIS — R0600 Dyspnea, unspecified: Secondary | ICD-10-CM | POA: Diagnosis not present

## 2016-10-13 NOTE — Progress Notes (Signed)
Daily Session Note  Patient Details  Name: Alexander Barton MRN: [id number] Date of Birth: November 11, 1950 Referring Provider:    Encounter Date: 10/13/2016  Check In:     Session Check In - 10/13/16 1013      Check-In   Location [facility]   Staff Present Earlean Shawl, BS, ACSM CEP, Exercise Physiologist;Joseph Foy [place], [place], ACSM CEP, Exercise Physiologist   Supervising physician immediately available to respond to emergencies [facility] immediately available ER MD   Physician(s) Dr. Corky Downs and Dr. Jimmye Norman   Medication changes reported     No   Fall or balance concerns reported    No   Warm-up and Cool-down Performed as group-led instruction   Resistance Training Performed Yes   VAD Patient? No     Pain Assessment   Currently in Pain? No/denies   Multiple Pain Sites No         History  Smoking Status  . Never Smoker  Smokeless Tobacco  . Never Used    Goals Met:  Proper associated with RPD/PD & O2 Sat Independence with exercise equipment Exercise tolerated well Strength training completed today  Goals Unmet:  Not Applicable  Comments: Pt able to follow exercise prescription today without complaint.  Will continue to monitor for progression.    Dr. Emily Filbert is Medical Director for [place] and [facility].

## 2016-10-15 DIAGNOSIS — R0600 Dyspnea, unspecified: Secondary | ICD-10-CM | POA: Diagnosis not present

## 2016-10-15 DIAGNOSIS — J449 Chronic obstructive pulmonary disease, unspecified: Secondary | ICD-10-CM

## 2016-10-15 NOTE — Progress Notes (Signed)
Daily Session Note  Patient Details  Name: Alexander Barton MRN: [id number] Date of Birth: [birth year] Referring Provider:    Encounter Date: 10/15/2016  Check In:     Session Check In - 10/15/16 1009      Check-In   Location [facility]   Staff Present Alberteen Sam, MA, ACSM RCEP, Exercise Physiologist;Amanda Oletta Darter, BA, ACSM CEP, Exercise Physiologist;Joseph Flavia Shipper   Supervising physician immediately available to respond to emergencies [facility] immediately available ER MD   Physician(s) Dr. Alfred Levins and Jimmye Norman   Medication changes reported     No   Fall or balance concerns reported    No   Warm-up and Cool-down Performed as group-led instruction   Resistance Training Performed Yes   VAD Patient? No     Pain Assessment   Currently in Pain? No/denies   Multiple Pain Sites No         History  Smoking Status  . Never Smoker  Smokeless Tobacco  . Never Used    Goals Met:  Independence with exercise equipment Exercise tolerated well No report of cardiac concerns or symptoms Strength training completed today  Goals Unmet:  Not Applicable  Comments: Pt able to follow exercise prescription today without complaint.  Will continue to monitor for progression.   Dr. Emily Filbert is Medical Director for [place] and [facility].

## 2016-10-17 ENCOUNTER — Encounter: Payer: Medicare Other | Admitting: *Deleted

## 2016-10-17 DIAGNOSIS — J449 Chronic obstructive pulmonary disease, unspecified: Secondary | ICD-10-CM

## 2016-10-17 DIAGNOSIS — R0600 Dyspnea, unspecified: Secondary | ICD-10-CM | POA: Diagnosis not present

## 2016-10-17 NOTE — Progress Notes (Signed)
Daily Session Note  Patient Details  Name: Alexander Barton MRN: [id number] Date of Birth: [birth year] Referring Provider:    Encounter Date: 10/17/2016  Check In:     Session Check In - 10/17/16 1010      Check-In   Location [facility]   Staff Present Renita Papa, RN Vickki Hearing, BA, ACSM CEP, Exercise Physiologist;Jessica Luan [place], [place], ACSM RCEP, Exercise Physiologist   Supervising physician immediately available to respond to emergencies [facility] immediately available ER MD   Physician(s) Dr. Clearnce Hasten and Jimmye Norman    Medication changes reported     No   Fall or balance concerns reported    No   Warm-up and Cool-down Performed as group-led instruction   Resistance Training Performed Yes   VAD Patient? No     Pain Assessment   Currently in Pain? No/denies         History  Smoking Status  . Never Smoker  Smokeless Tobacco  . Never Used    Goals Met:  Proper associated with RPD/PD & O2 Sat Independence with exercise equipment Using PLB without cueing & demonstrates good technique Exercise tolerated well Strength training completed today  Goals Unmet:  Not Applicable  Comments: Pt able to follow exercise prescription today without complaint.  Will continue to monitor for progression.    Dr. Emily Filbert is Medical Director for [place] and [facility].

## 2016-10-20 DIAGNOSIS — R0600 Dyspnea, unspecified: Secondary | ICD-10-CM | POA: Diagnosis not present

## 2016-10-20 DIAGNOSIS — J449 Chronic obstructive pulmonary disease, unspecified: Secondary | ICD-10-CM

## 2016-10-20 NOTE — Progress Notes (Signed)
Daily Session Note  Patient Details  Name: Alexander Barton MRN: [id number] Date of Birth: 1950-12-16 Referring Provider:    Encounter Date: 10/20/2016  Check In:     Session Check In - 10/20/16 1004      Check-In   Location [facility]   Staff Present Alberteen Sam, MA, ACSM RCEP, Exercise Physiologist;Amanda Oletta Darter, BA, ACSM CEP, Exercise Physiologist;Kelly Amedeo Plenty, BS, ACSM CEP, Exercise Physiologist;Srinika Delone Flavia Shipper   Supervising physician immediately available to respond to emergencies [facility] immediately available ER MD   Physician(s) Dr. Corky Downs and Jimmye Norman   Medication changes reported     No   Fall or balance concerns reported    No   Warm-up and Cool-down Performed as group-led instruction   Resistance Training Performed Yes   VAD Patient? No     Pain Assessment   Currently in Pain? No/denies   Multiple Pain Sites No         History  Smoking Status  . Never Smoker  Smokeless Tobacco  . Never Used    Goals Met:  Independence with exercise equipment Exercise tolerated well No report of cardiac concerns or symptoms Strength training completed today  Goals Unmet:  Not Applicable  Comments: Pt able to follow exercise prescription today without complaint.  Will continue to monitor for progression.   Dr. Emily Filbert is Medical Director for [place] and [facility].

## 2016-10-21 ENCOUNTER — Ambulatory Visit: Payer: Medicare Other | Admitting: Internal Medicine

## 2016-10-22 DIAGNOSIS — R0600 Dyspnea, unspecified: Secondary | ICD-10-CM

## 2016-10-22 DIAGNOSIS — J449 Chronic obstructive pulmonary disease, unspecified: Secondary | ICD-10-CM

## 2016-10-22 NOTE — Progress Notes (Signed)
Daily Session Note  Patient Details  Name: Alexander Barton MRN: [id number] Date of Birth: 1950/11/02 Referring Provider:    Encounter Date: 10/22/2016  Check In:     Session Check In - 10/22/16 1043      Check-In   Location [facility]   Staff Present Alberteen Sam, MA, ACSM RCEP, Exercise Physiologist;Thresa Dozier Oletta Darter, BA, ACSM CEP, Exercise Physiologist;Joseph Flavia Shipper   Supervising physician immediately available to respond to emergencies [facility] immediately available ER MD   Physician(s) Jimmye Norman and Joni Fears   Medication changes reported     No   Fall or balance concerns reported    No   Warm-up and Cool-down Performed as group-led Location manager Performed Yes   VAD Patient? No     Pain Assessment   Currently in Pain? No/denies         History  Smoking Status  . Never Smoker  Smokeless Tobacco  . Never Used    Goals Met:  Proper associated with RPD/PD & O2 Sat Independence with exercise equipment Exercise tolerated well Strength training completed today  Goals Unmet:  Not Applicable  Comments: Pt able to follow exercise prescription today without complaint.  Will continue to monitor for progression.    Dr. Emily Filbert is Medical Director for [place] and [facility].

## 2016-10-24 ENCOUNTER — Telehealth: Payer: Self-pay

## 2016-10-24 NOTE — Telephone Encounter (Signed)
Called to inform us that he is out of town and will not be attending [facility] today.

## 2016-10-25 LAB — POCT INR: INR: 3.5

## 2016-10-27 ENCOUNTER — Telehealth: Payer: Self-pay | Admitting: Cardiology

## 2016-10-27 ENCOUNTER — Ambulatory Visit (INDEPENDENT_AMBULATORY_CARE_PROVIDER_SITE_OTHER): Payer: Medicare Other | Admitting: Pharmacist Clinician (PhC)/ Clinical Pharmacy Specialist

## 2016-10-27 DIAGNOSIS — R0600 Dyspnea, unspecified: Secondary | ICD-10-CM | POA: Diagnosis not present

## 2016-10-27 DIAGNOSIS — I359 Nonrheumatic aortic valve disorder, unspecified: Secondary | ICD-10-CM

## 2016-10-27 DIAGNOSIS — Z952 Presence of prosthetic heart valve: Secondary | ICD-10-CM

## 2016-10-27 DIAGNOSIS — Z7901 Long term (current) use of anticoagulants: Secondary | ICD-10-CM | POA: Diagnosis not present

## 2016-10-27 DIAGNOSIS — J449 Chronic obstructive pulmonary disease, unspecified: Secondary | ICD-10-CM

## 2016-10-27 NOTE — Progress Notes (Signed)
Pulmonary Individual Treatment Plan  Patient Details  Name: Alexander Barton MRN: [id number] Date of Birth: 04-07-1950 Referring Provider:    Initial Encounter Date:    Pulmonary Rehab from 08/12/2016 in [facility] Cardiac [facility] Pulmonary Rehab  Date  08/12/16      Visit Diagnosis: Chronic obstructive pulmonary disease, unspecified COPD type ([place])  Patient's Home Medications on Admission:  Current Outpatient Prescriptions:  .  acidophilus (RISAQUAD) CAPS capsule, Take 2 capsules by mouth daily., Disp: , Rfl:  .  albuterol (PROVENTIL HFA;VENTOLIN HFA) 108 (90 Base) MCG/ACT inhaler, Inhale 2 puffs into the lungs every 4 (four) hours as needed for wheezing or shortness of breath., Disp: 1 Inhaler, Rfl: 3 .  Apoaequorin (PREVAGEN) 10 MG CAPS, Take 10 mg by mouth. Reported on 08/17/2015, Disp: , Rfl:  .  aspirin 81 MG tablet, Take 81 mg by mouth at bedtime. , Disp: , Rfl:  .  budesonide-formoterol (SYMBICORT) 160-4.5 MCG/ACT inhaler, Inhale 2 puffs into the lungs 2 (two) times daily., Disp: 1 Inhaler, Rfl: 12 .  cholecalciferol (VITAMIN D) 1000 units tablet, Take 1,000 Units by mouth daily., Disp: , Rfl:  .  metoprolol succinate (TOPROL-XL) 50 MG 24 hr tablet, TAKE 1 TABLET (50 MG TOTAL) BY MOUTH DAILY. TAKE WITH OR IMMEDIATELY FOLLOWING A MEAL., Disp: 90 tablet, Rfl: 1 .  Multiple Vitamin (MULTIVITAMIN WITH MINERALS) TABS tablet, Take 1 tablet by mouth 2 (two) times daily., Disp: , Rfl:  .  Nutritional Supplements (VITAMIN D PLUS COFACTORS PO), Take 1 capsule by mouth every evening., Disp: , Rfl:  .  omega-3 acid ethyl esters (LOVAZA) 1 g capsule, Take 1 g by mouth every evening., Disp: , Rfl:  .  SPIRULINA PO, Take 3 capsules by mouth daily., Disp: , Rfl:  .  tamsulosin (FLOMAX) 0.4 MG CAPS capsule, TAKE 2 CAPSULES (0.8 MG TOTAL) BY MOUTH DAILY. (Patient taking differently: Take 0.8 mg by mouth at bedtime. ), Disp: 180 capsule, Rfl: 3 .  terbinafine (LAMISIL) 250 MG tablet, Take 250 mg by mouth at  bedtime., Disp: , Rfl:  .  tiotropium (SPIRIVA) 18 MCG inhalation capsule, Place 1 capsule (18 mcg total) into inhaler and inhale daily. (Patient not taking: Reported on 08/12/2016), Disp: 30 capsule, Rfl: 12 .  Tiotropium Bromide Monohydrate (SPIRIVA RESPIMAT) 2.5 MCG/ACT AERS, Inhale 2.5 mcg into the lungs 2 (two) times daily., Disp: 1 Inhaler, Rfl: 12 .  warfarin (COUMADIN) 2 MG tablet, TAKE 1 TABLET WITH 5 MG TABLET DAILY AS DIRECTED BY COUMADIN CLINIC, Disp: 90 tablet, Rfl: 0 .  warfarin (COUMADIN) 5 MG tablet, Take 5-10 mg by mouth at bedtime. 96m on Monday, Wednesday, Friday. Take with 284mtab for a total 68m50mn all other days., Disp: , Rfl:   Past Medical History: Past Medical History:  Diagnosis Date  . Aortic aneurysm, thoracic ([place] . Aortic valve disease   . Arthritis   . BPH (benign prostatic hyperplasia)   . COPD (chronic obstructive pulmonary disease) ([place] . GERD (gastroesophageal reflux disease)   . HTN (hypertension)   . Hypercholesterolemia   . Stroke ([facility] . TIA (transient ischemic attack)     Tobacco Use: History  Smoking Status  . Never Smoker  Smokeless Tobacco  . Never Used    Labs: Recent Review Flowsheet Data    Labs for ITP Cardiac and Pulmonary Rehab Latest Ref Rng & Units 03/01/2010 03/02/2010 06/04/2012 11/14/2014 10/03/2015   Cholestrol 0 - 200 mg/dL - 118 ATP  III CLASSIFICATION: <200     mg/dL   Desirable 200-239  mg/dL   Borderline High >=240    mg/dL   High 247(H) 254(H) 174   LDLCALC 0 - 99 mg/dL - 67 Total Cholesterol/HDL:CHD Risk Coronary Heart Disease Risk Table Men   Women 1/2 Average Risk   3.4   3.3 Average Risk       5.0   4.4 2 X Average Risk   9.6   7.1 3 X Average Risk  23.4   11.0 Use the calculated Patient Ratio above and the CHD Risk Table to determine the patient's CHD Risk. ATP III CLASSIFICATION (LDL): <100     mg/dL   Optimal 100-129  mg/dL   Near or Above Optimal 130-159  mg/dL   Borderline 160-189  mg/dL    High >190     mg/dL   Very High 169(H) - 87   LDLDIRECT mg/dL - - - 172.0 -   HDL >39.00 mg/dL - 21(L) 45 48.50 47.20   Trlycerides 0.0 - 149.0 mg/dL - 152(H) 165(H) 202.0(H) 195.0(H)   Hemoglobin A1c 4.6 - 6.5 % 6.2 (NOTE)                                                                       According to the ADA Clinical Practice Recommendations for 2011, when HbA1c is used as a screening test:   >=6.5%   Diagnostic of Diabetes Mellitus           (if abnormal result is confirmed)  5.7-6.4%   Increased risk of developing Diabetes Mellitus  References:Diagnosis and Classification of Diabetes Mellitus,Diabetes CNOB,0962,83(MOQHU 1):S62-S69 and Standards of Medical Care in         Diabetes - 2011,Diabetes Care,2011,34 (Suppl 1):S11-S61.(H) - 5.9(H) - 6.2       Pulmonary Assessment Scores:     Pulmonary Assessment Scores    Row Name 08/12/16 1233 08/12/16 1527 09/29/16 1132     ADL UCSD   ADL Phase Entry  - Mid   SOB Score total 63  - 44   Rest 2  - 0   Walk 3  - 1   Stairs 5  - 5   Bath 3  - 1   Dress 2  - 1   Shop 2  - 1     mMRC Score   mMRC Score  - 1  -      Pulmonary Function Assessment:     Pulmonary Function Assessment - 08/12/16 1238      Initial Spirometry Results   FVC% 62 %   FEV1% 73 %   FEV1/FVC Ratio 74     Post Bronchodilator Spirometry Results   FVC% 71 %   FEV1% 76 %   FEV1/FVC Ratio 81      Exercise Target Goals:    Exercise Program Goal: Individual exercise prescription set with THRR, safety & activity barriers. Participant demonstrates ability to understand and report RPE using BORG scale, to self-measure pulse accurately, and to acknowledge the importance of the exercise prescription.  Exercise Prescription Goal: Starting with aerobic activity 30 plus minutes a day, 3 days per week for initial exercise prescription. Provide home exercise prescription and guidelines that participant acknowledges understanding  prior to discharge.  Activity  Barriers & Risk Stratification:   6 Minute Walk:     6 Minute Walk    Row Name 08/12/16 1313 08/12/16 1526       6 Minute Walk   Phase Initial Initial    Distance 1400 feet 1400 feet    Walk Time 6 minutes 6 minutes    # of Rest Breaks 0  -    MPH 2.65  -    METS 3.03  -    RPE 14 14    Perceived Dyspnea  2.5 2.5    VO2 Peak 10.61  -    Symptoms No No    Resting HR 58 bpm 58 bpm    Resting BP 106/64 106/64    Max Ex. HR 95 bpm 95 bpm    Max Ex. BP 120/58 120/58      Interval HR   Baseline HR (retired) 58  -    1 Minute HR 64  -    2 Minute HR 68  -    3 Minute HR 68  -    5 Minute HR 95  -    6 Minute HR 93  -    Interval Heart Rate? Yes  -      Interval Oxygen   Interval Oxygen? Yes  -    Baseline Oxygen Saturation % 96 %  -    Resting Liters of Oxygen 0 L  -    1 Minute Oxygen Saturation % 80 %  -    1 Minute Liters of Oxygen 0 L  -    2 Minute Oxygen Saturation % 80 %  -    2 Minute Liters of Oxygen 0 L  -    3 Minute Oxygen Saturation % 83 %  -    3 Minute Liters of Oxygen 0 L  -    4 Minute Liters of Oxygen 0 L  -    5 Minute Oxygen Saturation % 97 %  -    5 Minute Liters of Oxygen 0 L  -    6 Minute Oxygen Saturation % 100 %  -    6 Minute Liters of Oxygen 0 L  -    2 Minute Post Liters of Oxygen 0 L  -      Oxygen Initial Assessment:     Oxygen Initial Assessment - 08/12/16 1213      Home Oxygen   Home Oxygen Device None   Sleep Oxygen Prescription None   Home Exercise Oxygen Prescription None   Home at Rest Exercise Oxygen Prescription None      Oxygen Re-Evaluation:     Oxygen Re-Evaluation    Row Name 08/20/16 1122 09/11/16 0848 09/24/16 1412         Program Oxygen Prescription   Program Oxygen Prescription  - None None       Home Oxygen   Home Oxygen Device  - None None     Sleep Oxygen Prescription  - None None     Home Exercise Oxygen Prescription  - None None     Home at Rest Exercise Oxygen Prescription  - None None        Goals/Expected Outcomes   Short Term Goals To learn and demonstrate proper purse lipped breathing techniques or other breathing techniques. To learn and demonstrate proper purse lipped breathing techniques or other breathing techniques.;To learn and demonstrate proper use of respiratory medications;To learn and understand  importance of monitoring SPO2 with pulse oximeter and demonstrate accurate use of the pulse oximeter.;To Learn and understand importance of maintaining oxygen saturations>88% To learn and understand importance of monitoring SPO2 with pulse oximeter and demonstrate accurate use of the pulse oximeter.;To Learn and understand importance of maintaining oxygen saturations>88%;To learn and demonstrate proper purse lipped breathing techniques or other breathing techniques.;To learn and demonstrate proper use of respiratory medications     Long  Term Goals  - Maintenance of O2 saturations>88%;Compliance with respiratory medication;Demonstrates proper use of MDI's;Exhibits proper breathing techniques, such as purse lipped breathing or other method taught during program session;Verbalizes importance of monitoring SPO2 with pulse oximeter and return demonstration Maintenance of O2 saturations>88%;Compliance with respiratory medication;Demonstrates proper use of MDI's;Exhibits proper breathing techniques, such as purse lipped breathing or other method taught during program session;Verbalizes importance of monitoring SPO2 with pulse oximeter and return demonstration     Comments Reviewed pursed lip breathing with pt today.  Talked about how to use pursed lip breathing to improve breath control and discussed technique. Alexander Barton has been doing better with his breathing.  He is still not where he wants to be.  He is getting better at PLB and finds that it is helpful.  He has been using PLB and trying to avoid using his emergency inhaler.  So far he has been fairly successful.  He did note that sometimes  he still needs to sleep in the recliner versus the bed at night.  He has been complaint wiht his medications and using them correctly.  He has plans to get a pulse oximeter as part of a fitbit to monitor his saturations at home.  He is good about maintaining his saturations in rehab. Alexander Barton states that when he is outside and gets short of breath he get anxious. Informed Alexander Barton to try to relax and do his PLB when that happens. Alexander Barton has been taking his respiratory medications but has not been using a spacer. Spacer was given to him and he verbalizes understanding of use.     Goals/Expected Outcomes Short: Become more proficient at using PLB.  Long: Become independent at using PLB. Short: Continue to work on PLB.  Long: Continue to be compliant with medications. Short: continue to work on PLB and Using respiratory medications properly. Long: Be proficient in PLB and taking respiratory medications properly.         Oxygen Discharge (Final Oxygen Re-Evaluation):     Oxygen Re-Evaluation - 09/24/16 1412      Program Oxygen Prescription   Program Oxygen Prescription None     Home Oxygen   Home Oxygen Device None   Sleep Oxygen Prescription None   Home Exercise Oxygen Prescription None   Home at Rest Exercise Oxygen Prescription None     Goals/Expected Outcomes   Short Term Goals To learn and understand importance of monitoring SPO2 with pulse oximeter and demonstrate accurate use of the pulse oximeter.;To Learn and understand importance of maintaining oxygen saturations>88%;To learn and demonstrate proper purse lipped breathing techniques or other breathing techniques.;To learn and demonstrate proper use of respiratory medications   Long  Term Goals Maintenance of O2 saturations>88%;Compliance with respiratory medication;Demonstrates proper use of MDI's;Exhibits proper breathing techniques, such as purse lipped breathing or other method taught during program session;Verbalizes importance of monitoring  SPO2 with pulse oximeter and return demonstration   Comments Alexander Barton states that when he is outside and gets short of breath he get anxious. Informed Alexander Barton to try to relax and do  his PLB when that happens. Alexander Barton has been taking his respiratory medications but has not been using a spacer. Spacer was given to him and he verbalizes understanding of use.   Goals/Expected Outcomes Short: continue to work on PLB and Using respiratory medications properly. Long: Be proficient in PLB and taking respiratory medications properly.       Initial Exercise Prescription:     Initial Exercise Prescription - 08/12/16 1300      Date of Initial Exercise RX and Referring Provider   Date 08/12/16     Treadmill   MPH 2.5   Grade 0.5   Minutes 15   METs 2.9     Recumbant Bike   Level 2   RPM 60   Watts 30   Minutes 15   METs 2.9     T5 Nustep   Level 2   SPM 80   Minutes 15   METs 2.9     Prescription Details   Frequency (times per week) 3   Duration Progress to 45 minutes of aerobic exercise without signs/symptoms of physical distress     Intensity   THRR 40-80% of Max Heartrate 96-134   Ratings of Perceived Exertion 11-15   Perceived Dyspnea 0-4     Resistance Training   Training Prescription Yes   Weight 3 lb   Reps 10-15      Perform Capillary Blood Glucose checks as needed.  Exercise Prescription Changes:     Exercise Prescription Changes    Row Name 08/20/16 1100 09/04/16 1500 09/10/16 1400 09/17/16 1400 10/01/16 1400     Response to Exercise   Blood Pressure (Admit) 104/68 118/72  - 126/70 112/58   Blood Pressure (Exercise) 122/58 104/68  -  -  -   Blood Pressure (Exit) 100/60 110/60  - 126/64 100/60   Heart Rate (Admit) 78 bpm 81 bpm  - 85 bpm 81 bpm   Heart Rate (Exercise) 115 bpm 81 bpm  - 124 bpm 124 bpm   Heart Rate (Exit) 73 bpm 65 bpm  - 74 bpm 75 bpm   Oxygen Saturation (Admit) 97 % 98 %  - 98 % 96 %   Oxygen Saturation (Exercise) 96 % 96 %  - 95 % 96 %   Oxygen  Saturation (Exit) 97 % 97 %  - 97 % 93 %   Rating of Perceived Exertion (Exercise) 12 12  - 12 13   Perceived Dyspnea (Exercise) 1 1  - 1 1   Symptoms none none  - none knee pain   Comments first full day of exercise  -  -  - checking on PT   Duration Progress to 45 minutes of aerobic exercise without signs/symptoms of physical distress Continue with 45 min of aerobic exercise without signs/symptoms of physical distress.  - Continue with 45 min of aerobic exercise without signs/symptoms of physical distress. Continue with 45 min of aerobic exercise without signs/symptoms of physical distress.   Intensity THRR unchanged THRR unchanged  - THRR unchanged THRR unchanged     Progression   Progression Continue to progress workloads to maintain intensity without signs/symptoms of physical distress. Continue to progress workloads to maintain intensity without signs/symptoms of physical distress.  - Continue to progress workloads to maintain intensity without signs/symptoms of physical distress. Continue to progress workloads to maintain intensity without signs/symptoms of physical distress.   Average METs 2.5 2.5  - 3 3.5     Resistance Training   Training Prescription Yes  Yes  - Yes Yes   Weight body weight  late arrival 4  - 4 4 lb   Reps 10-15 10-15  - 10-15 10-15     Interval Training   Interval Training No No  - No  -     Treadmill   MPH 2.5  -  - 3 3.2   Grade 0.5  -  - 0.5 0.5   Minutes 15  -  - 15 15   METs 2.9  -  - 3.5 3.67     Recumbant Bike   Level  - 4  -  - 6   Watts  - 27  -  - 61   Minutes  - 15  -  - 15   METs  - 2.75  -  - 3.8     Arm Ergometer   Level  - 2  -  -  -   Minutes  - 15  -  -  -   METs  - 2.5  -  -  -     T5 Nustep   Level 2 4  - 4 6   SPM 75 83  - 85 79   Minutes 15 15  - 15 15   METs 2.1 2.1  - 2.5 3     Home Exercise Plan   Plans to continue exercise at  -  - Home (comment)  walking, also has ellipitcal  -  -   Frequency  -  - Add 2 additional  days to program exercise sessions.  -  -   Initial Home Exercises Provided  -  - 09/10/16  -  -   [place] Name 10/15/16 1400             Response to Exercise   Blood Pressure (Admit) 120/62       Blood Pressure (Exit) 106/64       Heart Rate (Admit) 85 bpm       Heart Rate (Exercise) 85 bpm       Heart Rate (Exit) 86 bpm       Oxygen Saturation (Admit) 96 %       Oxygen Saturation (Exercise) 91 %       Oxygen Saturation (Exit) 96 %       Rating of Perceived Exertion (Exercise) 13       Perceived Dyspnea (Exercise) 1       Symptoms none       Duration Continue with 45 min of aerobic exercise without signs/symptoms of physical distress.       Intensity THRR unchanged         Progression   Progression Continue to progress workloads to maintain intensity without signs/symptoms of physical distress.       Average METs 3.54         Resistance Training   Training Prescription Yes       Weight 5 lb.       Reps 10-15         Interval Training   Interval Training No         Treadmill   MPH 3.3       Grade 1       Minutes 15       METs 3.98         T5 Nustep   Level 6       SPM 77       Minutes 15  METs 3.1          Exercise Comments:     Exercise Comments    Row Name 08/20/16 1030           Exercise Comments First full day of exercise!  Patient was oriented to gym and equipment including functions, settings, policies, and procedures.  Patient's individual exercise prescription and treatment plan were reviewed.  All starting workloads were established based on the results of the 6 minute walk test done at initial orientation visit.  The plan for exercise progression was also introduced and progression will be customized based on patient's performance and goals.          Exercise Goals and Review:     Exercise Goals    Row Name 08/12/16 1313             Exercise Goals   Increase Physical Activity Yes       Intervention Provide advice, education, support  and counseling about physical activity/exercise needs.;Develop an individualized exercise prescription for aerobic and resistive training based on initial evaluation findings, risk stratification, comorbidities and participant's personal goals.       Expected Outcomes Achievement of increased cardiorespiratory fitness and enhanced flexibility, muscular endurance and strength shown through measurements of functional capacity and personal statement of participant.       Increase Strength and Stamina Yes       Intervention Provide advice, education, support and counseling about physical activity/exercise needs.;Develop an individualized exercise prescription for aerobic and resistive training based on initial evaluation findings, risk stratification, comorbidities and participant's personal goals.       Expected Outcomes Achievement of increased cardiorespiratory fitness and enhanced flexibility, muscular endurance and strength shown through measurements of functional capacity and personal statement of participant.          Exercise Goals Re-Evaluation :     Exercise Goals Re-Evaluation    Row Name 09/04/16 1523 09/10/16 1427 09/17/16 1451 10/01/16 1421 10/15/16 1458     Exercise Goal Re-Evaluation   Exercise Goals Review Increase Physical Activity;Increase Strenth and Stamina Increase Physical Activity;Increase Strenth and Stamina Increase Physical Activity;Increase Strenth and Stamina Increase Physical Activity;Increase Strength and Stamina;Understanding of Exercise Prescription Increase Physical Activity;Increase Strength and Stamina   Comments Alexander Barton has had a successful first week of exercise. Reviewed home exercise with pt today.  Pt plans to continue walking at home for exercise.  We talked about adding a minute at least each week in addition to adding in warm up and cool down to his current routine.  Reviewed THR, pulse, RPE, sign and symptoms, and when to call 911 or MD.  Also discussed weather  considerations and indoor options.  Pt voiced understanding. Alexander Barton has increased his overall MET level and is progressing well with exercise.   Alexander Barton stated he has had knee pain for a while and has spoken to his Dr.  He is going to schedule a PT evaluation. Alexander Barton is progressing well with exercise.  He has increased overall MET level and is up to 5 lb strength training.   Expected Outcomes Short - Alexander Barton will continue to attend regularly.  Long - Alexander Barton will develop regular exercise as a habit. Short: Start increasing time of exercise outside of rehab to work up 69mn.  Long: Regular exercise for 348m a day. Short - [employer] continue to progress during LW.  Long - [employer] exercise independently after completing LW.  Short - [employer] begin PT for his knee.  Long - Alexander Barton will be able to exercise without knee pain. Short - Alexander Barton wil continue to progress and see improved fitness. Long - Alexander Barton will maintain exercise when he finishes LW.      Discharge Exercise Prescription (Final Exercise Prescription Changes):     Exercise Prescription Changes - 10/15/16 1400      Response to Exercise   Blood Pressure (Admit) 120/62   Blood Pressure (Exit) 106/64   Heart Rate (Admit) 85 bpm   Heart Rate (Exercise) 85 bpm   Heart Rate (Exit) 86 bpm   Oxygen Saturation (Admit) 96 %   Oxygen Saturation (Exercise) 91 %   Oxygen Saturation (Exit) 96 %   Rating of Perceived Exertion (Exercise) 13   Perceived Dyspnea (Exercise) 1   Symptoms none   Duration Continue with 45 min of aerobic exercise without signs/symptoms of physical distress.   Intensity THRR unchanged     Progression   Progression Continue to progress workloads to maintain intensity without signs/symptoms of physical distress.   Average METs 3.54     Resistance Training   Training Prescription Yes   Weight 5 lb.   Reps 10-15     Interval Training   Interval Training No     Treadmill   MPH 3.3   Grade 1   Minutes 15   METs 3.98     T5 Nustep    Level 6   SPM 77   Minutes 15   METs 3.1      Nutrition:  Target Goals: Understanding of nutrition guidelines, daily intake of sodium <1535m, cholesterol <2074m calories 30% from fat and 7% or less from saturated fats, daily to have 5 or more servings of fruits and vegetables.  Biometrics:     Pre Biometrics - 08/12/16 1312      Pre Biometrics   Height '6\' 1"'  (1.854 m)   Weight 227 lb 3.2 oz (103.1 kg)   Waist Circumference 46.5 inches   Hip Circumference 45 inches   Waist to Hip Ratio 1.03 %   BMI (Calculated) 30       Nutrition Therapy Plan and Nutrition Goals:     Nutrition Therapy & Goals - 08/12/16 1222      Intervention Plan   Intervention Prescribe, educate and counsel regarding individualized specific dietary modifications aiming towards targeted core components such as weight, hypertension, lipid management, diabetes, heart failure and other comorbidities.   Expected Outcomes Short Term Goal: Understand basic principles of dietary content, such as calories, fat, sodium, cholesterol and nutrients.;Short Term Goal: A plan has been developed with personal nutrition goals set during dietitian appointment.;Long Term Goal: Adherence to prescribed nutrition plan.      Nutrition Discharge: Rate Your Plate Scores:   Nutrition Goals Re-Evaluation:     Nutrition Goals Re-Evaluation    Row Name 09/11/16 0844 10/10/16 1247           Goals   Nutrition Goal Meet with dietician to work on weight loss diet Eat more fruits and vegetables.      [place] to take his wife with him [employer] with the RD and is working towards 5 servings fruit and veggies per day.  His weight has maintained and he would like to lose.      Expected Outcome Short: Continue to work on [employer] Long: Meet wiht dietician Short - [employer] continue his healthier dietary changes.  Long - [employer]  weight loss goals.         Nutrition  Goals Discharge (Final Nutrition Goals Re-Evaluation):     Nutrition Goals Re-Evaluation - 10/10/16 1247      Goals   Nutrition Goal Eat more fruits and vegetables.   Comment Alexander Barton met with the RD and is working towards 5 servings fruit and veggies per day.  His weight has maintained and he would like to lose.   Expected Outcome Short - Alexander Barton will continue his healthier dietary changes.  Long - Alexander Barton will acheive weight loss goals.      Psychosocial: Target Goals: Acknowledge presence or absence of significant depression and/or stress, maximize coping skills, provide positive support system. Participant is able to verbalize types and ability to use techniques and skills needed for reducing stress and depression.   Initial Review & Psychosocial Screening:     Initial Psych Review & Screening - 08/12/16 1225      Initial Review   Current issues with History of Depression;Current Sleep Concerns  Over 20 years ago with depression history.  Sleep up several times a night, sometimes hard to get back to sleep.  Will use over the counter aid about 1-2 times a month to get back to sleep.      Family Dynamics   Good Support System? Yes  WOnderful wife of almost 41 years, children     Barriers   Psychosocial barriers to participate in program There are no identifiable barriers or psychosocial needs.;The patient should benefit from training in stress management and relaxation.     Screening Interventions   Interventions Encouraged to exercise;To provide support and resources with identified psychosocial needs      Quality of Life Scores:     Quality of Life - 08/12/16 1230      Quality of Life Scores   Health/Function Pre 19.2 %   Socioeconomic Pre 26.79 %   Psych/Spiritual Pre 27.86 %   Family Pre 30 %   GLOBAL Pre 24.13 %      PHQ-9: Recent Review Flowsheet Data    Depression screen [facility] 2/9 08/12/2016 12/05/2015   Decreased Interest 0 0   Down, Depressed, Hopeless 0 0   PHQ -  2 Score 0 0   Altered sleeping 0 -   Tired, decreased energy 2 -   Change in appetite 0 -   Feeling bad or failure about yourself  0 -   Trouble concentrating 0 -   Moving slowly or fidgety/restless 0 -   Suicidal thoughts 0 -   PHQ-9 Score 2 -   Difficult doing work/chores Not difficult at all -     Interpretation of Total Score  Total Score Depression Severity:  1-4 = Minimal depression, 5-9 = Mild depression, 10-14 = Moderate depression, 15-19 = Moderately severe depression, 20-27 = Severe depression   Psychosocial Evaluation and Intervention:     Psychosocial Evaluation - 09/01/16 1132      Psychosocial Evaluation & Interventions   Interventions Encouraged to exercise with the program and follow exercise prescription;Relaxation education   Comments Counselor met with Alexander Barton [phone]) today for initial psychosocial evaluation.  He is a 66 year old who was diagnosed with COPD in 2004; but reports his symptoms have recently been much worse.  Alexander Barton has a strong support system with a spouse of 15 years, a son who lives locally and a daughter who recently moved to [place].  Alexander Barton is also actively involved in his local church.  He  has multiple health issues with a heart valve replacement; (2) TIA's - one in 2009 and one in 2012 and a stroke in 2015.Marland Kitchen  He also struggles with arthritis.  Alexander Barton reports sleeping "okay" with some occasional use of OTC sleep aid PRN.  He has a good appetite.  Alexander Barton reports a history of depression in 1997; subsequent to a job loss; but has had no other symptoms since or currently.  He reports his mood is generally positive most of the time and he has minimal stres other than his health issues at this time.  Alexander Barton has goals to exercise consistently and improve his breathing.  He also wants to increase his cardio and pulmonary functioning overall.  STaff will follow with Alexander Barton throughout the course of this program.     Expected Outcomes Alexander Barton will benefit from consistent  exercise to achieve his stated goals.  He will also benefit from all of the educational aspects to learn to manage his medical issues better.   Continue Psychosocial Services  Follow up required by staff      Psychosocial Re-Evaluation:     Psychosocial Re-Evaluation    Mitchell Name 09/11/16 0845 10/08/16 1140           Psychosocial Re-Evaluation   Current issues with Current Sleep Concerns  -      Comments Alexander Barton has no major stressors at htis time.  He continues to watch his diet and health given his history.  He does not sleep well, but does try to take a supplement to help 2-3x a week.  He has been taking his time getting up in the middle of the night so he doesnt pass out on his way to the bathroom.  He has enjoyed class and has found the education classes to very beneficial. Counselor follow up with Alexander Barton today reporting progress noted since coming into this program by more stamina and strength in his ability to do his normal activities like vacuuming and washing the cars.  He reports the educational pieces have been extremely helpful with learning to pace himself and using the pursed lips for breathing.  He wishes he was able to sleep more than 5-6 hours/night but notes this is an improvement.  His appetite is good and he is learning to eat smaller meals more frequently vs. larger meals and this is more satisfying for him.  Alexander Barton remains in a positive mood and has minimal stress in his life currently.  Counselor commended him on the progress noted and his commitment to consistent exercise and positive self care.        Expected Outcomes Short: Continue to attend classes to learn more.  Long: Continue to look out for health and maintain positive attitude. Alexander Barton will continue to exercise consistently to maintain the progress he has made.  He also will continue to practice positive self care overall.      Interventions Stress management education;Encouraged to attend Pulmonary Rehabilitation for the  exercise;Relaxation education  -      Continue Psychosocial Services  Follow up required by staff  -         Psychosocial Discharge (Final Psychosocial Re-Evaluation):     Psychosocial Re-Evaluation - 10/08/16 1140      Psychosocial Re-Evaluation   Comments Counselor follow up with Alexander Barton today reporting progress noted since coming into this program by more stamina and strength in his ability to do his normal activities like vacuuming and washing the cars.  He reports the educational pieces  have been extremely helpful with learning to pace himself and using the pursed lips for breathing.  He wishes he was able to sleep more than 5-6 hours/night but notes this is an improvement.  His appetite is good and he is learning to eat smaller meals more frequently vs. larger meals and this is more satisfying for him.  Alexander Barton remains in a positive mood and has minimal stress in his life currently.  Counselor commended him on the progress noted and his commitment to consistent exercise and positive self care.     Expected Outcomes Alexander Barton will continue to exercise consistently to maintain the progress he has made.  He also will continue to practice positive self care overall.      Education: Education Goals: Education classes will be provided on a weekly basis, covering required topics. Participant will state understanding/return demonstration of topics presented.  Learning Barriers/Preferences:     Learning Barriers/Preferences - 08/12/16 1232      Learning Barriers/Preferences   Learning Barriers None   Learning Preferences None      Education Topics: Initial Evaluation Education: - Verbal, written and demonstration of respiratory meds, RPE/PD scales, oximetry and breathing techniques. Instruction on use of nebulizers and MDIs: cleaning and proper use, rinsing mouth with steroid doses and importance of monitoring MDI activations.   Pulmonary Rehab from 10/22/2016 in [facility] Cardiac and Pulmonary Rehab   Date  08/12/16  Educator  SB  Instruction Review Code (retired)  2- meets goals/outcomes      General Nutrition Guidelines/Fats and Fiber: -Group instruction provided by verbal, written material, models and posters to present the general guidelines for heart healthy nutrition. Gives an explanation and review of dietary fats and fiber.   Pulmonary Rehab from 10/22/2016 in [facility] Cardiac and Pulmonary Rehab  Date  09/22/16  Educator  CR  Instruction Review Code  1- Verbalizes Understanding      Controlling Sodium/Reading Food Labels: -Group verbal and written material supporting the discussion of sodium use in heart healthy nutrition. Review and explanation with models, verbal and written materials for utilization of the food label.   Pulmonary Rehab from 10/22/2016 in [facility] Cardiac and Pulmonary Rehab  Date  09/29/16  Educator  CR  Instruction Review Code  1- Verbalizes Understanding      Exercise Physiology & Risk Factors: - Group verbal and written instruction with models to review the exercise physiology of the cardiovascular system and associated critical values. Details cardiovascular disease risk factors and the goals associated with each risk factor.   Aerobic Exercise & Resistance Training: - Gives group verbal and written discussion on the health impact of inactivity. On the components of aerobic and resistive training programs and the benefits of this training and how to safely progress through these programs.   Pulmonary Rehab from 10/22/2016 in [facility] Cardiac and Pulmonary Rehab  Date  08/29/16  Educator  [facility]  Instruction Review Code  2- Demonstrated Understanding      Flexibility, Balance, General Exercise Guidelines: - Provides group verbal and written instruction on the benefits of flexibility and balance training programs. Provides general exercise guidelines with specific guidelines to those with heart or lung disease. Demonstration and skill practice provided.    Pulmonary Rehab from 10/22/2016 in [facility] Cardiac and Pulmonary Rehab  Date  09/17/16  Educator  [facility]  Instruction Review Code  2- Demonstrated Understanding      Stress Management: - Provides group verbal and written instruction about the health risks of elevated stress, cause  of high stress, and healthy ways to reduce stress.   Pulmonary Rehab from 10/22/2016 in [facility] Cardiac and Pulmonary Rehab  Date  10/22/16  Educator  [facility]  Instruction Review Code  1- Verbalizes Understanding      Depression: - Provides group verbal and written instruction on the correlation between heart/lung disease and depressed mood, treatment options, and the stigmas associated with seeking treatment.   Pulmonary Rehab from 10/22/2016 in [facility] Cardiac and Pulmonary Rehab  Date  09/24/16  Educator  [facility]  Instruction Review Code (retired)  2- meets goals/outcomes      Exercise & Equipment Safety: - Individual verbal instruction and demonstration of equipment use and safety with use of the equipment.   Pulmonary Rehab from 10/22/2016 in [facility] Cardiac and Pulmonary Rehab  Date  08/12/16  Educator  SB  Instruction Review Code  1- Verbalizes Understanding      Infection Prevention: - Provides verbal and written material to individual with discussion of infection control including proper hand washing and proper equipment cleaning during exercise session.   Pulmonary Rehab from 10/22/2016 in [facility] Cardiac and Pulmonary Rehab  Date  08/12/16  Educator  Sb  Instruction Review Code  1- Verbalizes Understanding      Falls Prevention: - Provides verbal and written material to individual with discussion of falls prevention and safety.   Pulmonary Rehab from 10/22/2016 in [facility] Cardiac and Pulmonary Rehab  Date  08/12/16  Educator  SB  Instruction Review Code (retired)  2- meets goals/outcomes      Diabetes: - Individual verbal and written instruction to review signs/symptoms of diabetes, desired ranges of glucose level  fasting, after meals and with exercise. Advice that pre and post exercise glucose checks will be done for 3 sessions at entry of program.   Chronic Lung Diseases: - Group verbal and written instruction to review new updates, new respiratory medications, new advancements in procedures and treatments. Provide informative websites and "800" numbers of self-education.   Pulmonary Rehab from 10/22/2016 in [facility] Cardiac and Pulmonary Rehab  Date  08/20/16  Educator  [facility]  Instruction Review Code  1- Verbalizes Understanding      Lung Procedures: - Group verbal and written instruction to describe testing methods done to diagnose lung disease. Review the outcome of test results. Describe the treatment choices: Pulmonary Function Tests, ABGs and oximetry.   Energy Conservation: - Provide group verbal and written instruction for methods to conserve energy, plan and organize activities. Instruct on pacing techniques, use of adaptive equipment and posture/positioning to relieve shortness of breath.   Pulmonary Rehab from 10/22/2016 in [facility] Cardiac and Pulmonary Rehab  Date  10/15/16  Educator  [facility]  Instruction Review Code  1- Verbalizes Understanding      Triggers: - Group verbal and written instruction to review types of environmental controls: home humidity, furnaces, filters, dust mite/pet prevention, HEPA vacuums. To discuss weather changes, air quality and the benefits of nasal washing.   Exacerbations: - Group verbal and written instruction to provide: warning signs, infection symptoms, calling MD promptly, preventive modes, and value of vaccinations. Review: effective airway clearance, coughing and/or vibration techniques. Create an Sports administrator.   Oxygen: - Individual and group verbal and written instruction on oxygen therapy. Includes supplement oxygen, available portable oxygen systems, continuous and intermittent flow rates, oxygen safety, concentrators, and Medicare reimbursement for  oxygen.   Respiratory Medications: - Group verbal and written instruction to review medications for lung disease. Drug class, frequency, complications, importance of  spacers, rinsing mouth after steroid MDI's, and proper cleaning methods for nebulizers.   AED/CPR: - Group verbal and written instruction with the use of models to demonstrate the basic use of the AED with the basic ABC's of resuscitation.   Breathing Retraining: - Provides individuals verbal and written instruction on purpose, frequency, and proper technique of diaphragmatic breathing and pursed-lipped breathing. Applies individual practice skills.   Anatomy and Physiology of the Lungs: - Group verbal and written instruction with the use of models to provide basic lung anatomy and physiology related to function, structure and complications of lung disease.   Pulmonary Rehab from 10/22/2016 in [facility] Cardiac and Pulmonary Rehab  Date  10/08/16  Educator  [facility]  Instruction Review Code  1- Verbalizes Understanding      Anatomy & Physiology of the Heart: - Group verbal and written instruction and models provide basic cardiac anatomy and physiology, with the coronary electrical and arterial systems. Review of: AMI, Angina, Valve disease, Heart Failure, Cardiac Arrhythmia, Pacemakers, and the ICD.   Heart Failure: - Group verbal and written instruction on the basics of heart failure: signs/symptoms, treatments, explanation of ejection fraction, enlarged heart and cardiomyopathy.   Sleep Apnea: - Individual verbal and written instruction to review Obstructive Sleep Apnea. Review of risk factors, methods for diagnosing and types of masks and machines for OSA.   Anxiety: - Provides group, verbal and written instruction on the correlation between heart/lung disease and anxiety, treatment options, and management of anxiety.   Relaxation: - Provides group, verbal and written instruction about the benefits of relaxation for  patients with heart/lung disease. Also provides patients with examples of relaxation techniques.   Cardiac Medications: - Group verbal and written instruction to review commonly prescribed medications for heart disease. Reviews the medication, class of the drug, and side effects.   Pulmonary Rehab from 10/22/2016 in [facility] Cardiac and Pulmonary Rehab  Date  10/10/16  Educator  [facility] [place]  Instruction Review Code  1- Verbalizes Understanding      Know Your Numbers: -Group verbal and written instruction about important numbers in your health.  Review of Cholesterol, Blood Pressure, Diabetes, and BMI and the role they play in your overall health.   Other: -Provides group and verbal instruction on various topics (see comments)    Knowledge Questionnaire Score:     Knowledge Questionnaire Score - 08/12/16 1232      Knowledge Questionnaire Score   Pre Score 9/10  reviewed correct response with Alexander Barton. He verbalized understanding of the correct response.       Core Components/Risk Factors/Patient Goals at Admission:     Personal Goals and Risk Factors at Admission - 08/12/16 1222      Core Components/Risk Factors/Patient Goals on Admission    Weight Management Yes   Intervention Weight Management: Develop a combined nutrition and exercise program designed to reach desired caloric intake, while maintaining appropriate intake of nutrient and fiber, sodium and fats, and appropriate energy expenditure required for the weight goal.   Admit Weight 227 lb 3.2 oz (103.1 kg)   Goal Weight: Short Term 224 lb (101.6 kg)   Goal Weight: Long Term 180 lb (81.6 kg)   Expected Outcomes Short Term: Continue to assess and modify interventions until short term weight is achieved;Long Term: Adherence to nutrition and physical activity/exercise program aimed toward attainment of established weight goal;Weight Loss: Understanding of general recommendations for a balanced deficit meal plan, which promotes  1-2 lb weight loss per week and includes  a negative energy balance of [phone] kcal/d   Improve shortness of breath with ADL's Yes   Intervention Provide education, individualized exercise plan and daily activity instruction to help decrease symptoms of SOB with activities of daily living.   Expected Outcomes Short Term: Achieves a reduction of symptoms when performing activities of daily living.   Develop more efficient breathing techniques such as purse lipped breathing and diaphragmatic breathing; and practicing self-pacing with activity Yes   Intervention Provide education, demonstration and support about specific breathing techniuqes utilized for more efficient breathing. Include techniques such as pursed lipped breathing, diaphragmatic breathing and self-pacing activity.   Expected Outcomes Short Term: Participant will be able to demonstrate and use breathing techniques as needed throughout daily activities.   Increase knowledge of respiratory medications and ability to use respiratory devices properly  Yes   Intervention Provide education and demonstration as needed of appropriate use of medications, inhalers, and oxygen therapy.   Expected Outcomes Short Term: Achieves understanding of medications use. Understands that oxygen is a medication prescribed by physician. Demonstrates appropriate use of inhaler and oxygen therapy.   Hypertension Yes   Intervention Provide education on lifestyle modifcations including regular physical activity/exercise, weight management, moderate sodium restriction and increased consumption of fresh fruit, vegetables, and low fat dairy, alcohol moderation, and smoking cessation.;Monitor prescription use compliance.   Expected Outcomes Short Term: Continued assessment and intervention until BP is < 140/9m HG in hypertensive participants. < 130/856mHG in hypertensive participants with diabetes, heart failure or chronic kidney disease.;Long Term: Maintenance of blood  pressure at goal levels.   Lipids Yes  Unable to tolerate statins   Intervention Provide education and support for participant on nutrition & aerobic/resistive exercise along with prescribed medications to achieve LDL <7035mHDL >29m2m Expected Outcomes Short Term: Participant states understanding of desired cholesterol values and is compliant with medications prescribed. Participant is following exercise prescription and nutrition guidelines.;Long Term: Cholesterol controlled with medications as prescribed, with individualized exercise RX and with personalized nutrition plan. Value goals: LDL < 70mg57mL > 40 mg.      Core Components/Risk Factors/Patient Goals Review:      Goals and Risk Factor Review    Row Name 09/10/16 1431 10/10/16 1249           Core Components/Risk Factors/Patient Goals Review   Personal Goals Review Weight Management/Obesity;Hypertension;Lipids Weight Management/Obesity;Improve shortness of breath with ADL's      Review Alexander Barton's weight has been holding steady around 230lbs.  He does fluctuate up and down some.  His blood pressures have been doing well and he is checking them at home.  He has plans to purchase a Fitbit like device that will monitor his heart rate, blood pressure, and oxygen satuarations.  He has not had his lipids checked recently.  He is using nutritional supplements to help improve his cholesterol numbers.  Alexander Barton Alexander Landmarknot lost weight but is maintaining.  He states he can do ADLS now that he could not before such as vacuuming, laundry, washing cars,etc.        Expected Outcomes Short: Continue to work on weight loss.  Long: Continue to work on risk factor modificiations. Short - Alexander Barton Alexander Barton continue to attend LW and build stamina.  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         Core Components/Risk Factors/Patient Goals at Discharge (Final Review):      Goals and Risk Factor Review -  10/10/16 1249       Core Components/Risk Factors/Patient Goals Review   Personal Goals Review Weight Management/Obesity;Improve shortness of breath with ADL's   Review Alexander Barton has not lost weight but is maintaining.  He states he can do ADLS now that he could not before such as vacuuming, laundry, washing cars,etc.     Expected Outcomes Short - Alexander Barton will continue to attend LW and build stamina.  Long - Alexander Barton will [employer] and continue to maintain healthy lifestyle choices on his own.      ITP Comments:     ITP Comments    Row Name 08/12/16 1213 08/22/16 1029 09/01/16 0900 09/26/16 1025 09/29/16 0827   ITP Comments Medical review completed today with Initial ITP created.  Disgnosis documnetation can be found in [facility] Encounter date 07/22/2016 Patient attened Education "Know Your Numbers" 30 day review completed ITP sent to Dr. Ramonita Lab for Dr. Emily Filbert Director of [place]. Continue with ITP unless changes are made by physician.  Gwyndolyn Saxon met with the Pulmonary Rehab Registered Dietician today.  30 day review completed. ITP sent to Dr. Emily Filbert Director of [place]. Continue with ITP unless changes are made by physician.     Armstrong Name 10/27/16 0830           ITP Comments 30 day review completed. ITP sent to Dr. Emily Filbert Director of [place]. Continue with ITP unless changes are made by physician.            Comments: 30 day review

## 2016-10-27 NOTE — Progress Notes (Signed)
Daily Session Note  Patient Details  Name: Alexander Barton MRN: [id number] Date of Birth: 1950/07/02 Referring Provider:    Encounter Date: 10/27/2016  Check In:     Session Check In - 10/27/16 1011      Check-In   Location [facility]   Staff Present Nada Maclachlan, BA, ACSM CEP, Exercise Physiologist;Kelly Amedeo Plenty, BS, ACSM CEP, Exercise Physiologist;Joseph Flavia Shipper   Supervising physician immediately available to respond to emergencies [facility] immediately available ER MD   Physician(s) Dr. Clearnce Hasten and [facility]   Medication changes reported     No   Fall or balance concerns reported    No   Warm-up and Cool-down Performed as group-led instruction   Resistance Training Performed Yes   VAD Patient? No     Pain Assessment   Currently in Pain? No/denies   Multiple Pain Sites No         History  Smoking Status  . Never Smoker  Smokeless Tobacco  . Never Used    Goals Met:  Independence with exercise equipment Exercise tolerated well No report of cardiac concerns or symptoms Strength training completed today  Goals Unmet:  Not Applicable  Comments: Pt able to follow exercise prescription today without complaint.  Will continue to monitor for progression.   Dr. Emily Filbert is Medical Director for [place] and [facility].

## 2016-10-27 NOTE — Telephone Encounter (Signed)
See anticoag note

## 2016-10-27 NOTE — Telephone Encounter (Signed)
New message   Pt states returning a call to [place]. States no message was left but that he knows it was kristen.

## 2016-10-29 ENCOUNTER — Other Ambulatory Visit: Payer: Self-pay | Admitting: Family Medicine

## 2016-10-29 NOTE — Telephone Encounter (Signed)
Patient saw DR.Cook 07/02/16 last filled by Dr.Cook 11/05/15 [id number]

## 2016-10-29 NOTE — Telephone Encounter (Signed)
Pt called needing a refill for tamsulosin (FLOMAX) 0.4 MG CAPS capsule.  Pharmacy is [id number] - [place], [place] [address]  Call pt @ [phone]. Thank you!

## 2016-10-30 MED ORDER — TAMSULOSIN HCL 0.4 MG PO CAPS: 0.8000 mg | ORAL_CAPSULE | Freq: Every day | ORAL | 1 refills | Status: DC

## 2016-10-30 NOTE — Telephone Encounter (Signed)
Refill given. Please make sure the patient has an appointment to establish care with me. Thanks.

## 2016-10-31 NOTE — Telephone Encounter (Signed)
Patient scheduled.

## 2016-11-03 ENCOUNTER — Encounter: Payer: Medicare Other | Attending: Internal Medicine

## 2016-11-03 DIAGNOSIS — R0600 Dyspnea, unspecified: Secondary | ICD-10-CM | POA: Insufficient documentation

## 2016-11-03 DIAGNOSIS — J449 Chronic obstructive pulmonary disease, unspecified: Secondary | ICD-10-CM

## 2016-11-03 NOTE — Progress Notes (Signed)
Daily Session Note  Patient Details  Name: Alexander Barton MRN: [id number] Date of Birth: September 29, 1950 Referring Provider:    Encounter Date: 11/03/2016  Check In:     Session Check In - 11/03/16 1017      Check-In   Location [facility]   Staff Present Nada Maclachlan, BA, ACSM CEP, Exercise Physiologist;Kelly Amedeo Plenty, BS, ACSM CEP, Exercise Physiologist;Kafi Dotter Flavia Shipper   Supervising physician immediately available to respond to emergencies [facility] immediately available ER MD   Physician(s) Spent 10 minutes talking to Buena about PLB and when to use it.   Medication changes reported     No   Fall or balance concerns reported    No   Warm-up and Cool-down Performed as group-led instruction   Resistance Training Performed Yes   VAD Patient? No     Pain Assessment   Multiple Pain Sites No         History  Smoking Status  . Never Smoker  Smokeless Tobacco  . Never Used    Goals Met:  Independence with exercise equipment Exercise tolerated well No report of cardiac concerns or symptoms Strength training completed today  Goals Unmet:  Not Applicable  Comments: Pt able to follow exercise prescription today without complaint.  Will continue to monitor for progression.   Dr. Emily Filbert is Medical Director for [place] and [facility].

## 2016-11-04 LAB — POCT INR: INR: 3.2

## 2016-11-05 ENCOUNTER — Ambulatory Visit (INDEPENDENT_AMBULATORY_CARE_PROVIDER_SITE_OTHER): Payer: Medicare Other | Admitting: Pharmacist

## 2016-11-05 DIAGNOSIS — J449 Chronic obstructive pulmonary disease, unspecified: Secondary | ICD-10-CM

## 2016-11-05 DIAGNOSIS — Z7901 Long term (current) use of anticoagulants: Secondary | ICD-10-CM

## 2016-11-05 DIAGNOSIS — I359 Nonrheumatic aortic valve disorder, unspecified: Secondary | ICD-10-CM

## 2016-11-05 DIAGNOSIS — R0600 Dyspnea, unspecified: Secondary | ICD-10-CM | POA: Diagnosis not present

## 2016-11-05 NOTE — Progress Notes (Signed)
Daily Session Note  Patient Details  Name: Alexander Barton MRN: [id number] Date of Birth: 1950-10-29 Referring Provider:    Encounter Date: 11/05/2016  Check In:     Session Check In - 11/05/16 1019      Check-In   Location [facility]   Staff Present Alberteen Sam, MA, ACSM RCEP, Exercise Physiologist;Amanda Oletta Darter, BA, ACSM CEP, Exercise Physiologist;Atticus Wedin Flavia Shipper   Supervising physician immediately available to respond to emergencies [facility] immediately available ER MD   Physician(s) Dr. Clearnce Hasten and Jimmye Norman   Medication changes reported     No   Fall or balance concerns reported    No   Warm-up and Cool-down Performed as group-led instruction   Resistance Training Performed Yes   VAD Patient? No     Pain Assessment   Currently in Pain? No/denies   Multiple Pain Sites No         History  Smoking Status  . Never Smoker  Smokeless Tobacco  . Never Used    Goals Met:  Independence with exercise equipment Exercise tolerated well No report of cardiac concerns or symptoms Strength training completed today  Goals Unmet:  Not Applicable  Comments: Pt able to follow exercise prescription today without complaint.  Will continue to monitor for progression.   Dr. Emily Filbert is Medical Director for [place] and [facility].

## 2016-11-07 ENCOUNTER — Ambulatory Visit (INDEPENDENT_AMBULATORY_CARE_PROVIDER_SITE_OTHER): Payer: Medicare Other | Admitting: Internal Medicine

## 2016-11-07 ENCOUNTER — Encounter: Payer: Self-pay | Admitting: Internal Medicine

## 2016-11-07 ENCOUNTER — Encounter: Payer: Medicare Other | Admitting: *Deleted

## 2016-11-07 VITALS — BP 108/80 | HR 75 | Resp 16 | Ht 73.0 in | Wt 230.0 lb

## 2016-11-07 DIAGNOSIS — J449 Chronic obstructive pulmonary disease, unspecified: Secondary | ICD-10-CM

## 2016-11-07 DIAGNOSIS — R0600 Dyspnea, unspecified: Secondary | ICD-10-CM | POA: Diagnosis not present

## 2016-11-07 NOTE — Patient Instructions (Addendum)
Continue inhalers as prescribed 

## 2016-11-07 NOTE — Progress Notes (Signed)
Daily Session Note  Patient Details  Name: Alexander Barton MRN: [id number] Date of Birth: December 12, 1950 Referring Provider:    Encounter Date: 11/07/2016  Check In:     Session Check In - 11/07/16 1043      Check-In   Location [facility]   Staff Present Gerlene Burdock, RN, Vickki Hearing, BA, ACSM CEP, Exercise Physiologist;Joseph Flavia Shipper   Supervising physician immediately available to respond to emergencies [facility] immediately available ER MD   Physician(s) Dr. Corky Downs and Dr. Reita Cliche   Medication changes reported     No   Fall or balance concerns reported    No   Tobacco Cessation No Change   Warm-up and Cool-down Performed as group-led instruction   Resistance Training Performed Yes   VAD Patient? No     Pain Assessment   Currently in Pain? No/denies         History  Smoking Status  . Never Smoker  Smokeless Tobacco  . Never Used    Goals Met:  Proper associated with RPD/PD & O2 Sat Independence with exercise equipment Exercise tolerated well Strength training completed today  Goals Unmet:  Not Applicable  Comments: Pt able to follow exercise prescription today without complaint.  Will continue to monitor for progression.     Dr. Emily Filbert is Medical Director for [place] and [facility].

## 2016-11-07 NOTE — Progress Notes (Signed)
   Name: Alexander Barton MRN: [id number] DOB: 27-Oct-1950     CONSULTATION DATE: 11/07/2016  REFERRING MD : Dr. Lacinda Axon  CHIEF COMPLAINT: Shortness of breath   Office spirometry on July 22, 2016 FEV1 FEC ratio is 60 since her son predicted FEV1 is 2.1 L with 56% predicted FEC is 3.3 L with 63% predicted Taste on office spirometry patient has moderate to severe obstructive airways disease with restrictive lung disease likely related to obesity  HISTORY OF PRESENT ILLNESS:   66 year old pleasant white male seen today for chronic shortness of breath Patient has had shortness of breath for many years Patient doing well with inhaler therapy Less SOB Less wheezing Infrequent albuterol use   no signs of infection at this time No signs of CHF at this time  pulm rehab has really helped   I have dicussed ONO and 6MWT test results with patient  The tests DID NOT reveal hypoxia  He denies wheezing coughing and increased work of breathing No fevers no chills no weight loss   Patient is a nonsmoker however was exposed to secondhand next smoke exposure for approximately 20 years Patient is retired but has worked in the [employer] for approximately 20 years and was exposed to [place]:   SEE HPI ALL OTHER ROS ARE NEGATIVE   Resp 16   Ht 6\' 1"  (1.854 m)   Wt 230 lb (104.3 kg)   BMI 30.34 kg/m    Physical Examination:   GENERAL:NAD, no fevers, chills, no weakness no fatigue HEAD: Normocephalic, atraumatic.  EYES: Pupils equal, round, reactive to light. Extraocular muscles intact. No scleral icterus.  MOUTH: Moist mucosal membrane.   EAR, NOSE, THROAT: Clear without exudates. No external lesions.  NECK: Supple. No thyromegaly. No nodules. No JVD.  PULMONARY:CTA B/L no wheezes, no crackles, no rhonchi CARDIOVASCULAR: S1 and S2. Regular rate and rhythm. No murmurs, rubs, or gallops. No edema.  GASTROINTESTINAL: Soft, nontender, nondistended. No masses.  Positive bowel sounds.  SKIN: No ulceration, lesions, rashes, or cyanosis. Skin warm and dry. Turgor intact.  PSYCHIATRIC: Mood, affect within normal limits. The patient is awake, alert and oriented x 3. Insight, judgment intact.    CT of the chest May 2018 I have Independently reviewed images of  Ct chest   Interpretation: No evidence of pneumonia and no evidence of CHF or evidence of effusions no masses noted   ASSESSMENT / PLAN: 66 year old pleasant white male seen today for progressive shortness of breath and dyspnea on exertion findings  suggest moderate to severe COPD Gold stage D with FEV1 56% predicted in the setting of mechanical valve on chronic Coumadin therapy in the setting of morbid obesity and deconditioned state  #1 shortness breath and dyspnea on exertion  Multifactorial in nature related to COPD as well as cardiac disease and deconditioned state  #2 moderate to severe COPD based on pulmonary function testing with FEV1 of 56% predicted continueSpiriva RESPIMAT therapy 2.5 twice daily continueSymbicort 160 twice daily Albuterol as needed  #3 Obesity -recommend significant weight loss -recommend changing diet  #4 Deconditioned state -Recommend increased daily activity and exercise   #5 history of mechanical valve Follow-up cardiology as recommended  Patient satisfied with Plan of action and management. All questions answered Follow-up in 6 months for respiratory assessment and after tests completed  Corrin Parker, M.D.  [name]  Medical Director [place] Director [facility] Cardio-Pulmonary Department

## 2016-11-10 DIAGNOSIS — R0600 Dyspnea, unspecified: Secondary | ICD-10-CM | POA: Diagnosis not present

## 2016-11-10 DIAGNOSIS — J449 Chronic obstructive pulmonary disease, unspecified: Secondary | ICD-10-CM

## 2016-11-10 NOTE — Progress Notes (Signed)
Daily Session Note  Patient Details  Name: Alexander Barton MRN: [id number] Date of Birth: 06-22-1950 Referring Provider:    Encounter Date: 11/10/2016  Check In:     Session Check In - 11/10/16 1003      Check-In   Location [facility]   Staff Present Nada Maclachlan, BA, ACSM CEP, Exercise Physiologist;Kelly Amedeo Plenty, BS, ACSM CEP, Exercise Physiologist;Arianne Klinge Flavia Shipper   Supervising physician immediately available to respond to emergencies [facility] immediately available ER MD   Physician(s) Dr. Jimmye Norman and [facility]   Medication changes reported     No   Fall or balance concerns reported    No   Warm-up and Cool-down Performed on first and last piece of equipment   Resistance Training Performed Yes   VAD Patient? No     Pain Assessment   Currently in Pain? No/denies   Multiple Pain Sites No         History  Smoking Status  . Never Smoker  Smokeless Tobacco  . Never Used    Goals Met:  Proper associated with RPD/PD & O2 Sat Independence with exercise equipment Using PLB without cueing & demonstrates good technique Exercise tolerated well No report of cardiac concerns or symptoms Strength training completed today  Goals Unmet:  Not Applicable  Comments: Pt able to follow exercise prescription today without complaint.  Will continue to monitor for progression.    Dr. Emily Filbert is Medical Director for [place] and [facility].

## 2016-11-12 DIAGNOSIS — J449 Chronic obstructive pulmonary disease, unspecified: Secondary | ICD-10-CM

## 2016-11-12 DIAGNOSIS — R0600 Dyspnea, unspecified: Secondary | ICD-10-CM | POA: Diagnosis not present

## 2016-11-12 NOTE — Progress Notes (Signed)
Daily Session Note  Patient Details  Name: Alexander Barton MRN: [id number] Date of Birth: Apr 28, 1950 Referring Provider:    Encounter Date: 11/12/2016  Check In:     Session Check In - 11/12/16 0958      Check-In   Location [facility]   Staff Present Alberteen Sam, MA, ACSM RCEP, Exercise Physiologist;Amanda Oletta Darter, BA, ACSM CEP, Exercise Physiologist;Quintel Mccalla Flavia Shipper   Supervising physician immediately available to respond to emergencies [facility] immediately available ER MD   Physician(s) Dr. Jimmye Norman and Cinda Quest   Medication changes reported     No   Fall or balance concerns reported    No   Warm-up and Cool-down Performed as group-led instruction   Resistance Training Performed Yes   VAD Patient? No     Pain Assessment   Currently in Pain? No/denies   Multiple Pain Sites No         History  Smoking Status  . Never Smoker  Smokeless Tobacco  . Never Used    Goals Met:  Independence with exercise equipment Exercise tolerated well No report of cardiac concerns or symptoms Strength training completed today  Goals Unmet:  Not Applicable  Comments: Pt able to follow exercise prescription today without complaint.  Will continue to monitor for progression.   Dr. Emily Filbert is Medical Director for [place] and [facility].

## 2016-11-14 ENCOUNTER — Telehealth: Payer: Self-pay

## 2016-11-14 NOTE — Telephone Encounter (Signed)
Alexander Barton called to say he has not had power and will not be attending [place] today.

## 2016-11-17 DIAGNOSIS — J449 Chronic obstructive pulmonary disease, unspecified: Secondary | ICD-10-CM

## 2016-11-17 DIAGNOSIS — R0600 Dyspnea, unspecified: Secondary | ICD-10-CM | POA: Diagnosis not present

## 2016-11-17 NOTE — Progress Notes (Signed)
Daily Session Note  Patient Details  Name: Alexander Barton MRN: [id number] Date of Birth: [birth year] Referring Provider:    Encounter Date: 11/17/2016  Check In:     Session Check In - 11/17/16 1019      Check-In   Staff Present Earlean Shawl, BS, ACSM CEP, Exercise Physiologist;Laureen Owens Shark, BS, RRT, Respiratory Therapist;Aleksa Catterton [place] physician immediately available to respond to emergencies [facility] immediately available ER MD   Physician(s) Dr. Cherylann Banas and Corky Downs   Medication changes reported     No   Fall or balance concerns reported    No   Warm-up and Cool-down Performed as group-led instruction   Resistance Training Performed Yes   VAD Patient? No     Pain Assessment   Currently in Pain? No/denies   Multiple Pain Sites No         History  Smoking Status  . Never Smoker  Smokeless Tobacco  . Never Used    Goals Met:  Proper associated with RPD/PD & O2 Sat Independence with exercise equipment Exercise tolerated well No report of cardiac concerns or symptoms Strength training completed today  Goals Unmet:  Not Applicable  Comments: Pt able to follow exercise prescription today without complaint.  Will continue to monitor for progression.   Dr. Emily Filbert is Medical Director for [place] and [facility].

## 2016-11-19 DIAGNOSIS — J449 Chronic obstructive pulmonary disease, unspecified: Secondary | ICD-10-CM

## 2016-11-19 DIAGNOSIS — R0600 Dyspnea, unspecified: Secondary | ICD-10-CM | POA: Diagnosis not present

## 2016-11-19 NOTE — Progress Notes (Signed)
Daily Session Note  Patient Details  Name: Alexander Barton MRN: [id number] Date of Birth: 11/21/1950 Referring Provider:    Encounter Date: 11/19/2016  Check In:     Session Check In - 11/19/16 1013      Check-In   Location [facility]   Staff Present Nada Maclachlan, BA, ACSM CEP, Exercise Physiologist;Krista Frederico Hamman, RN BSN;Bertha Lokken Flavia Shipper   Supervising physician immediately available to respond to emergencies [facility] immediately available ER MD   Physician(s) Dr. Mariea Clonts and Joni Fears   Medication changes reported     No   Fall or balance concerns reported    No   Warm-up and Cool-down Performed as group-led instruction   Resistance Training Performed Yes   VAD Patient? No     Pain Assessment   Currently in Pain? No/denies   Multiple Pain Sites No         History  Smoking Status  . Never Smoker  Smokeless Tobacco  . Never Used    Goals Met:  Independence with exercise equipment Exercise tolerated well No report of cardiac concerns or symptoms Strength training completed today  Goals Unmet:  Not Applicable  Comments: Pt able to follow exercise prescription today without complaint.  Will continue to monitor for progression.   Dr. Emily Filbert is Medical Director for [place] and [facility].

## 2016-11-20 ENCOUNTER — Ambulatory Visit (INDEPENDENT_AMBULATORY_CARE_PROVIDER_SITE_OTHER): Payer: Medicare Other | Admitting: Pharmacist

## 2016-11-20 DIAGNOSIS — Z7901 Long term (current) use of anticoagulants: Secondary | ICD-10-CM | POA: Diagnosis not present

## 2016-11-20 DIAGNOSIS — I359 Nonrheumatic aortic valve disorder, unspecified: Secondary | ICD-10-CM

## 2016-11-21 VITALS — Ht 73.0 in | Wt 230.0 lb

## 2016-11-21 DIAGNOSIS — R0600 Dyspnea, unspecified: Secondary | ICD-10-CM | POA: Diagnosis not present

## 2016-11-21 DIAGNOSIS — J449 Chronic obstructive pulmonary disease, unspecified: Secondary | ICD-10-CM

## 2016-11-21 LAB — POCT INR: INR: 3.3

## 2016-11-21 NOTE — Progress Notes (Signed)
Daily Session Note  Patient Details  Name: Alexander Barton MRN: [id number] Date of Birth: 21-Jan-1951 Referring Provider:    Encounter Date: 11/21/2016  Check In:     Session Check In - 11/21/16 1003      Check-In   Location [facility]   Staff Present Nada Maclachlan, BA, ACSM CEP, Exercise Physiologist;Louise Rawson Alcus Dad, RN BSN   Supervising physician immediately available to respond to emergencies [facility] immediately available ER MD   Physician(s) Dr. Kerman Passey and Quentin Cornwall   Medication changes reported     No   Fall or balance concerns reported    No   Warm-up and Cool-down Performed as group-led instruction   Resistance Training Performed Yes   VAD Patient? No     Pain Assessment   Currently in Pain? No/denies   Multiple Pain Sites No         History  Smoking Status  . Never Smoker  Smokeless Tobacco  . Never Used    Goals Met:  Independence with exercise equipment Exercise tolerated well No report of cardiac concerns or symptoms Strength training completed today  Goals Unmet:  Not Applicable  Comments:  Pt able to follow exercise prescription today without complaint.  Will continue to monitor for progression.      [place] Name 08/12/16 1313 08/12/16 1526 11/21/16 1058     6 Minute Walk   Phase Initial Initial Discharge   Distance 1400 feet 1400 feet 1933 feet   Distance % Change  -  - 38 %   Distance Feet Change  -  - 533 ft   Walk Time 6 minutes 6 minutes 6 minutes   # of Rest Breaks 0  - 0   MPH 2.65  - 3.66   METS 3.03  - 4.67   RPE _0 Perceived Dyspnea  2.5 2.5 2   VO2 Peak 10.61  - 16.33   Symptoms No No No   Resting HR 58 bpm 58 bpm 100 bpm   Resting BP 106/64 106/64 134/62   Resting Oxygen Saturation   -  - 97 %   Exercise Oxygen Saturation  during 6 min walk  -  - 96 %   Max Ex. HR 95 bpm 95 bpm 128 bpm   Max Ex. BP 120/58 120/58 166/64     Interval HR   Baseline HR (retired) 58  -  -   1 Minute HR 64  - 98   2 Minute HR 68  - 114   3 Minute HR 68  - 124   4 Minute HR  -  - 128   5 Minute HR 95  - 127   6 Minute HR 93  - 127   2 Minute Post HR  -  - 91   Interval Heart Rate? Yes  - Yes     Interval Oxygen   Interval Oxygen? Yes  -  -   Baseline Oxygen Saturation % 96 %  - 97 %   Resting Liters of Oxygen 0 L  -  -   1 Minute Oxygen Saturation % 80 %  - 96 %   1 Minute Liters of Oxygen 0 L  - 0 L   2 Minute Oxygen Saturation % 80 %  - 97 %   2 Minute Liters of Oxygen 0 L  - 0 L   3 Minute Oxygen Saturation % 83 %  -  98 %   3 Minute Liters of Oxygen 0 L  - 0 L   4 Minute Oxygen Saturation %  -  - 97 %   4 Minute Liters of Oxygen 0 L  - 0 L   5 Minute Oxygen Saturation % 97 %  - 96 %   5 Minute Liters of Oxygen 0 L  - 0 L   6 Minute Oxygen Saturation % 100 %  - 97 %   6 Minute Liters of Oxygen 0 L  - 0 L   2 Minute Post Oxygen Saturation %  -  - 97 %   2 Minute Post Liters of Oxygen 0 L  - 0 L       Dr. Emily Filbert is Medical Director for [place] and [facility].

## 2016-11-24 ENCOUNTER — Telehealth: Payer: Self-pay | Admitting: Family Medicine

## 2016-11-24 DIAGNOSIS — J449 Chronic obstructive pulmonary disease, unspecified: Secondary | ICD-10-CM

## 2016-11-24 DIAGNOSIS — R0600 Dyspnea, unspecified: Secondary | ICD-10-CM | POA: Diagnosis not present

## 2016-11-24 NOTE — Progress Notes (Signed)
Pulmonary Individual Treatment Plan  Patient Details  Name: Alexander Barton MRN: [id number] Date of Birth: 1950-02-05 Referring Provider:    Initial Encounter Date:    Pulmonary Rehab from 08/12/2016 in [facility]  Date  08/12/16      Visit Diagnosis: Chronic obstructive pulmonary disease, unspecified COPD type ([place])  Patient's Home Medications on Admission:  Current Outpatient Prescriptions:  .  acidophilus (RISAQUAD) CAPS capsule, Take 2 capsules by mouth daily., Disp: , Rfl:  .  albuterol (PROVENTIL HFA;VENTOLIN HFA) 108 (90 Base) MCG/ACT inhaler, Inhale 2 puffs into the lungs every 4 (four) hours as needed for wheezing or shortness of breath., Disp: 1 Inhaler, Rfl: 3 .  Apoaequorin (PREVAGEN) 10 MG CAPS, Take 10 mg by mouth. Reported on 08/17/2015, Disp: , Rfl:  .  aspirin 81 MG tablet, Take 81 mg by mouth at bedtime. , Disp: , Rfl:  .  budesonide-formoterol (SYMBICORT) 160-4.5 MCG/ACT inhaler, Inhale 2 puffs into the lungs 2 (two) times daily., Disp: 1 Inhaler, Rfl: 12 .  cholecalciferol (VITAMIN D) 1000 units tablet, Take 1,000 Units by mouth daily., Disp: , Rfl:  .  metoprolol succinate (TOPROL-XL) 50 MG 24 hr tablet, TAKE 1 TABLET (50 MG TOTAL) BY MOUTH DAILY. TAKE WITH OR IMMEDIATELY FOLLOWING A MEAL., Disp: 90 tablet, Rfl: 1 .  Multiple Vitamin (MULTIVITAMIN WITH MINERALS) TABS tablet, Take 1 tablet by mouth 2 (two) times daily., Disp: , Rfl:  .  Nutritional Supplements (VITAMIN D PLUS COFACTORS PO), Take 1 capsule by mouth every evening., Disp: , Rfl:  .  omega-3 acid ethyl esters (LOVAZA) 1 g capsule, Take 1 g by mouth every evening., Disp: , Rfl:  .  SPIRULINA PO, Take 3 capsules by mouth daily., Disp: , Rfl:  .  tamsulosin (FLOMAX) 0.4 MG CAPS capsule, Take 2 capsules (0.8 mg total) by mouth daily., Disp: 180 capsule, Rfl: 1 .  Tiotropium Bromide Monohydrate (SPIRIVA RESPIMAT) 2.5 MCG/ACT AERS, Inhale 2.5 mcg into the lungs 2 (two) times daily.,  Disp: 1 Inhaler, Rfl: 12 .  warfarin (COUMADIN) 2 MG tablet, TAKE 1 TABLET WITH 5 MG TABLET DAILY AS DIRECTED BY COUMADIN CLINIC, Disp: 90 tablet, Rfl: 0 .  warfarin (COUMADIN) 5 MG tablet, Take 5-10 mg by mouth at bedtime. 46m on Monday, Wednesday, Friday. Take with 2810mtab for a total 10m59mn all other days., Disp: , Rfl:   Past Medical History: Past Medical History:  Diagnosis Date  . Aortic aneurysm, thoracic ([facility] . Aortic valve disease   . Arthritis   . BPH (benign prostatic hyperplasia)   . COPD (chronic obstructive pulmonary disease) ([facility] . GERD (gastroesophageal reflux disease)   . HTN (hypertension)   . Hypercholesterolemia   . Stroke ([facility] . TIA (transient ischemic attack)     Tobacco Use: History  Smoking Status  . Never Smoker  Smokeless Tobacco  . Never Used    Labs: Recent Review Flowsheet Data    Labs for ITP Cardiac and Pulmonary Rehab Latest Ref Rng & Units 03/01/2010 03/02/2010 06/04/2012 11/14/2014 10/03/2015   Cholestrol 0 - 200 mg/dL - 118 ATP III CLASSIFICATION: <200     mg/dL   Desirable 200-239  mg/dL   Borderline High >=240    mg/dL   High 247(H) 254(H) 174   LDLCALC 0 - 99 mg/dL - 67 Total Cholesterol/HDL:CHD Risk Coronary Heart Disease Risk Table Men   Women 1/2 Average Risk   3.4   3.3  Average Risk       5.0   4.4 2 X Average Risk   9.6   7.1 3 X Average Risk  23.4   11.0 Use the calculated Patient Ratio above and the CHD Risk Table to determine the patient's CHD Risk. ATP III CLASSIFICATION (LDL): <100     mg/dL   Optimal 100-129  mg/dL   Near or Above Optimal 130-159  mg/dL   Borderline 160-189  mg/dL   High >190     mg/dL   Very High 169(H) - 87   LDLDIRECT mg/dL - - - 172.0 -   HDL >39.00 mg/dL - 21(L) 45 48.50 47.20   Trlycerides 0.0 - 149.0 mg/dL - 152(H) 165(H) 202.0(H) 195.0(H)   Hemoglobin A1c 4.6 - 6.5 % 6.2 (NOTE)                                                                       According to the ADA Clinical  Practice Recommendations for 2011, when HbA1c is used as a screening test:   >=6.5%   Diagnostic of Diabetes Mellitus           (if abnormal result is confirmed)  5.7-6.4%   Increased risk of developing Diabetes Mellitus  References:Diagnosis and Classification of Diabetes Mellitus,Diabetes IHKV,4259,56(LOVFI 1):S62-S69 and Standards of Medical Care in         Diabetes - 2011,Diabetes Care,2011,34 (Suppl 1):S11-S61.(H) - 5.9(H) - 6.2       Pulmonary Assessment Scores:     Pulmonary Assessment Scores    Row Name 08/12/16 1233 08/12/16 1527 09/29/16 1132     ADL UCSD   ADL Phase Entry  - Mid   SOB Score total 63  - 44   Rest 2  - 0   Walk 3  - 1   Stairs 5  - 5   Bath 3  - 1   Dress 2  - 1   Shop 2  - 1     mMRC Score   mMRC Score  - 1  -   Row Name 11/19/16 1056         ADL UCSD   ADL Phase Exit     SOB Score total 46     Rest 0     Walk 2     Stairs 5     Bath 2     Dress 1     Shop 1        Pulmonary Function Assessment:     Pulmonary Function Assessment - 08/12/16 1238      Initial Spirometry Results   FVC% 62 %   FEV1% 73 %   FEV1/FVC Ratio 74     Post Bronchodilator Spirometry Results   FVC% 71 %   FEV1% 76 %   FEV1/FVC Ratio 81      Exercise Target Goals:    Exercise Program Goal: Individual exercise prescription set with THRR, safety & activity barriers. Participant demonstrates ability to understand and report RPE using BORG scale, to self-measure pulse accurately, and to acknowledge the importance of the exercise prescription.  Exercise Prescription Goal: Starting with aerobic activity 30 plus minutes a day, 3 days per week for initial exercise prescription. Provide home  exercise prescription and guidelines that participant acknowledges understanding prior to discharge.  Activity Barriers & Risk Stratification:   6 Minute Walk:     6 Minute Walk    Row Name 08/12/16 1313 08/12/16 1526 11/21/16 1058     6 Minute Walk   Phase Initial  Initial Discharge   Distance 1400 feet 1400 feet 1933 feet   Distance % Change  -  - 38 %   Distance Feet Change  -  - 533 ft   Walk Time 6 minutes 6 minutes 6 minutes   # of Rest Breaks 0  - 0   MPH 2.65  - 3.66   METS 3.03  - 4.67   RPE '14 14 13   ' Perceived Dyspnea  2.5 2.5 2   VO2 Peak 10.61  - 16.33   Symptoms No No No   Resting HR 58 bpm 58 bpm 100 bpm   Resting BP 106/64 106/64 134/62   Resting Oxygen Saturation   -  - 97 %   Exercise Oxygen Saturation  during 6 min walk  -  - 96 %   Max Ex. HR 95 bpm 95 bpm 128 bpm   Max Ex. BP 120/58 120/58 166/64     Interval HR   Baseline HR (retired) 58  -  -   1 Minute HR 64  - 98   2 Minute HR 68  - 114   3 Minute HR 68  - 124   4 Minute HR  -  - 128   5 Minute HR 95  - 127   6 Minute HR 93  - 127   2 Minute Post HR  -  - 91   Interval Heart Rate? Yes  - Yes     Interval Oxygen   Interval Oxygen? Yes  -  -   Baseline Oxygen Saturation % 96 %  - 97 %   Resting Liters of Oxygen 0 L  -  -   1 Minute Oxygen Saturation % 80 %  - 96 %   1 Minute Liters of Oxygen 0 L  - 0 L   2 Minute Oxygen Saturation % 80 %  - 97 %   2 Minute Liters of Oxygen 0 L  - 0 L   3 Minute Oxygen Saturation % 83 %  - 98 %   3 Minute Liters of Oxygen 0 L  - 0 L   4 Minute Oxygen Saturation %  -  - 97 %   4 Minute Liters of Oxygen 0 L  - 0 L   5 Minute Oxygen Saturation % 97 %  - 96 %   5 Minute Liters of Oxygen 0 L  - 0 L   6 Minute Oxygen Saturation % 100 %  - 97 %   6 Minute Liters of Oxygen 0 L  - 0 L   2 Minute Post Oxygen Saturation %  -  - 97 %   2 Minute Post Liters of Oxygen 0 L  - 0 L     Oxygen Initial Assessment:     Oxygen Initial Assessment - 08/12/16 1213      Home Oxygen   Home Oxygen Device None   Sleep Oxygen Prescription None   Home Exercise Oxygen Prescription None   Home at Rest Exercise Oxygen Prescription None      Oxygen Re-Evaluation:     Oxygen Re-Evaluation    Row Name 08/20/16 1122 09/11/16 0848 09/24/16  1412  11/10/16 1728       Program Oxygen Prescription   Program Oxygen Prescription  - None None None      Home Oxygen   Home Oxygen Device  - None None  -    Sleep Oxygen Prescription  - None None  -    Home Exercise Oxygen Prescription  - None None None    Home at Rest Exercise Oxygen Prescription  - None None None      Goals/Expected Outcomes   Short Term Goals To learn and demonstrate proper purse lipped breathing techniques or other breathing techniques. To learn and demonstrate proper purse lipped breathing techniques or other breathing techniques.;To learn and demonstrate proper use of respiratory medications;To learn and understand importance of monitoring SPO2 with pulse oximeter and demonstrate accurate use of the pulse oximeter.;To Learn and understand importance of maintaining oxygen saturations>88% To learn and understand importance of monitoring SPO2 with pulse oximeter and demonstrate accurate use of the pulse oximeter.;To Learn and understand importance of maintaining oxygen saturations>88%;To learn and demonstrate proper purse lipped breathing techniques or other breathing techniques.;To learn and demonstrate proper use of respiratory medications To learn and demonstrate proper pursed lip breathing techniques or other breathing techniques.;To learn and understand importance of monitoring SPO2 with pulse oximeter and demonstrate accurate use of the pulse oximeter.;To learn and understand importance of maintaining oxygen saturations>88%;To learn and demonstrate proper use of respiratory medications    Long  Term Goals  - Maintenance of O2 saturations>88%;Compliance with respiratory medication;Demonstrates proper use of MDI's;Exhibits proper breathing techniques, such as purse lipped breathing or other method taught during program session;Verbalizes importance of monitoring SPO2 with pulse oximeter and return demonstration Maintenance of O2 saturations>88%;Compliance with respiratory  medication;Demonstrates proper use of MDI's;Exhibits proper breathing techniques, such as purse lipped breathing or other method taught during program session;Verbalizes importance of monitoring SPO2 with pulse oximeter and return demonstration Verbalizes importance of monitoring SPO2 with pulse oximeter and return demonstration;Exhibits proper breathing techniques, such as pursed lip breathing or other method taught during program session;Demonstrates proper use of MDI's;Compliance with respiratory medication;Maintenance of O2 saturations>88%    Comments Reviewed pursed lip breathing with pt today.  Talked about how to use pursed lip breathing to improve breath control and discussed technique. Alexander Barton has been doing better with his breathing.  He is still not where he wants to be.  He is getting better at PLB and finds that it is helpful.  He has been using PLB and trying to avoid using his emergency inhaler.  So far he has been fairly successful.  He did note that sometimes he still needs to sleep in the recliner versus the bed at night.  He has been complaint wiht his medications and using them correctly.  He has plans to get a pulse oximeter as part of a fitbit to monitor his saturations at home.  He is good about maintaining his saturations in rehab. Alexander Barton states that when he is outside and gets short of breath he get anxious. Informed Alexander Barton to try to relax and do his PLB when that happens. Alexander Barton has been taking his respiratory medications but has not been using a spacer. Spacer was given to him and he verbalizes understanding of use. Alexander Barton has been using his space with his medications and has been taking them in the right order. He states that since he has been doing that his breathing has improved. Alexander Barton knows how to use his spacer and uses it everyday. Alexander Barton  still has room to improve on his pursed lip breathing.    Goals/Expected Outcomes Short: Become more proficient at using PLB.  Long: Become independent at  using PLB. Short: Continue to work on PLB.  Long: Continue to be compliant with medications. Short: continue to work on PLB and Using respiratory medications properly. Long: Be proficient in PLB and taking respiratory medications properly.  Short: Work on [employer] breathing techniques. Long: Be independent with PLB.       Oxygen Discharge (Final Oxygen Re-Evaluation):     Oxygen Re-Evaluation - 11/10/16 1728      Program Oxygen Prescription   Program Oxygen Prescription None     Home Oxygen   Home Exercise Oxygen Prescription None   Home at Rest Exercise Oxygen Prescription None     Goals/Expected Outcomes   Short Term Goals To learn and demonstrate proper pursed lip breathing techniques or other breathing techniques.;To learn and understand importance of monitoring SPO2 with pulse oximeter and demonstrate accurate use of the pulse oximeter.;To learn and understand importance of maintaining oxygen saturations>88%;To learn and demonstrate proper use of respiratory medications   Long  Term Goals Verbalizes importance of monitoring SPO2 with pulse oximeter and return demonstration;Exhibits proper breathing techniques, such as pursed lip breathing or other method taught during program session;Demonstrates proper use of MDI's;Compliance with respiratory medication;Maintenance of O2 saturations>88%   Comments Alexander Barton has been using his space with his medications and has been taking them in the right order. He states that since he has been doing that his breathing has improved. Alexander Barton knows how to use his spacer and uses it everyday. Alexander Barton still has room to improve on his pursed lip breathing.   Goals/Expected Outcomes Short: Work on Pursed lip breathing techniques. Long: Be independent with PLB.      Initial Exercise Prescription:     Initial Exercise Prescription - 08/12/16 1300      Date of Initial Exercise RX and Referring Provider   Date 08/12/16     Treadmill   MPH 2.5   Grade 0.5    Minutes 15   METs 2.9     Recumbant Bike   Level 2   RPM 60   Watts 30   Minutes 15   METs 2.9     T5 Nustep   Level 2   SPM 80   Minutes 15   METs 2.9     Prescription Details   Frequency (times per week) 3   Duration Progress to 45 minutes of aerobic exercise without signs/symptoms of physical distress     Intensity   THRR 40-80% of Max Heartrate 96-134   Ratings of Perceived Exertion 11-15   Perceived Dyspnea 0-4     Resistance Training   Training Prescription Yes   Weight 3 lb   Reps 10-15      Perform Capillary Blood Glucose checks as needed.  Exercise Prescription Changes:     Exercise Prescription Changes    Row Name 08/20/16 1100 09/04/16 1500 09/10/16 1400 09/17/16 1400 10/01/16 1400     Response to Exercise   Blood Pressure (Admit) 104/68 118/72  - 126/70 112/58   Blood Pressure (Exercise) 122/58 104/68  -  -  -   Blood Pressure (Exit) 100/60 110/60  - 126/64 100/60   Heart Rate (Admit) 78 bpm 81 bpm  - 85 bpm 81 bpm   Heart Rate (Exercise) 115 bpm 81 bpm  - 124 bpm 124 bpm   Heart Rate (Exit) 73  bpm 65 bpm  - 74 bpm 75 bpm   Oxygen Saturation (Admit) 97 % 98 %  - 98 % 96 %   Oxygen Saturation (Exercise) 96 % 96 %  - 95 % 96 %   Oxygen Saturation (Exit) 97 % 97 %  - 97 % 93 %   Rating of Perceived Exertion (Exercise) 12 12  - 12 13   Perceived Dyspnea (Exercise) 1 1  - 1 1   Symptoms none none  - none knee pain   Comments first full day of exercise  -  -  - checking on PT   Duration Progress to 45 minutes of aerobic exercise without signs/symptoms of physical distress Continue with 45 min of aerobic exercise without signs/symptoms of physical distress.  - Continue with 45 min of aerobic exercise without signs/symptoms of physical distress. Continue with 45 min of aerobic exercise without signs/symptoms of physical distress.   Intensity THRR unchanged THRR unchanged  - THRR unchanged THRR unchanged     Progression   Progression Continue to progress  workloads to maintain intensity without signs/symptoms of physical distress. Continue to progress workloads to maintain intensity without signs/symptoms of physical distress.  - Continue to progress workloads to maintain intensity without signs/symptoms of physical distress. Continue to progress workloads to maintain intensity without signs/symptoms of physical distress.   Average METs 2.5 2.5  - 3 3.5     Resistance Training   Training Prescription Yes Yes  - Yes Yes   Weight body weight  late arrival 4  - 4 4 lb   Reps 10-15 10-15  - 10-15 10-15     Interval Training   Interval Training No No  - No  -     Treadmill   MPH 2.5  -  - 3 3.2   Grade 0.5  -  - 0.5 0.5   Minutes 15  -  - 15 15   METs 2.9  -  - 3.5 3.67     Recumbant Bike   Level  - 4  -  - 6   Watts  - 27  -  - 61   Minutes  - 15  -  - 15   METs  - 2.75  -  - 3.8     Arm Ergometer   Level  - 2  -  -  -   Minutes  - 15  -  -  -   METs  - 2.5  -  -  -     T5 Nustep   Level 2 4  - 4 6   SPM 75 83  - 85 79   Minutes 15 15  - 15 15   METs 2.1 2.1  - 2.5 3     Home Exercise Plan   Plans to continue exercise at  -  - Home (comment)  walking, also has ellipitcal  -  -   Frequency  -  - Add 2 additional days to program exercise sessions.  -  -   Initial Home Exercises Provided  -  - 09/10/16  -  -   [place] Name 10/15/16 1400 10/29/16 1500 11/12/16 1400         Response to Exercise   Blood Pressure (Admit) 120/62 130/80 124/64     Blood Pressure (Exit) 106/64 102/58 122/60     Heart Rate (Admit) 85 bpm 70 bpm 70 bpm     Heart Rate (Exercise) 85 bpm 127  bpm 106 bpm     Heart Rate (Exit) 86 bpm 75 bpm 75 bpm     Oxygen Saturation (Admit) 96 % 98 % 96 %     Oxygen Saturation (Exercise) 91 % 95 % 96 %     Oxygen Saturation (Exit) 96 % 97 % 98 %     Rating of Perceived Exertion (Exercise) '13 13 15     ' Perceived Dyspnea (Exercise) '1 1 5     ' Symptoms none none none     Duration Continue with 45 min of aerobic exercise  without signs/symptoms of physical distress. Continue with 45 min of aerobic exercise without signs/symptoms of physical distress. Continue with 45 min of aerobic exercise without signs/symptoms of physical distress.     Intensity THRR unchanged THRR unchanged THRR unchanged       Progression   Progression Continue to progress workloads to maintain intensity without signs/symptoms of physical distress. Continue to progress workloads to maintain intensity without signs/symptoms of physical distress. Continue to progress workloads to maintain intensity without signs/symptoms of physical distress.     Average METs 3.54 3.7 3.8       Resistance Training   Training Prescription Yes  - Yes     Weight 5 lb.  - 5 lb     Reps 10-15  - 10-15       Interval Training   Interval Training No  -  -       Treadmill   MPH 3.3 3.3  -     Grade 1 1  -     Minutes 15 15  -     METs 3.98 3.98  -       Recumbant Bike   Level  - 7  -     Watts  - 61  -     Minutes  - 15  -     METs  - 3.8  -       T5 Nustep   Level 6 7  -     SPM 77 78  -     Minutes 15 15  -     METs 3.1 3.3  -        Exercise Comments:     Exercise Comments    Row Name 08/20/16 1030           Exercise Comments First full day of exercise!  Patient was oriented to gym and equipment including functions, settings, policies, and procedures.  Patient's individual exercise prescription and treatment plan were reviewed.  All starting workloads were established based on the results of the 6 minute walk test done at initial orientation visit.  The plan for exercise progression was also introduced and progression will be customized based on patient's performance and goals.          Exercise Goals and Review:     Exercise Goals    Row Name 08/12/16 1313             Exercise Goals   Increase Physical Activity Yes       Intervention Provide advice, education, support and counseling about physical activity/exercise  needs.;Develop an individualized exercise prescription for aerobic and resistive training based on initial evaluation findings, risk stratification, comorbidities and participant's personal goals.       Expected Outcomes Achievement of increased cardiorespiratory fitness and enhanced flexibility, muscular endurance and strength shown through measurements of functional capacity and personal statement of participant.  Increase Strength and Stamina Yes       Intervention Provide advice, education, support and counseling about physical activity/exercise needs.;Develop an individualized exercise prescription for aerobic and resistive training based on initial evaluation findings, risk stratification, comorbidities and participant's personal goals.       Expected Outcomes Achievement of increased cardiorespiratory fitness and enhanced flexibility, muscular endurance and strength shown through measurements of functional capacity and personal statement of participant.          Exercise Goals Re-Evaluation :     Exercise Goals Re-Evaluation    Row Name 09/04/16 1523 09/10/16 1427 09/17/16 1451 10/01/16 1421 10/15/16 1458     Exercise Goal Re-Evaluation   Exercise Goals Review Increase Physical Activity;Increase Strenth and Stamina Increase Physical Activity;Increase Strenth and Stamina Increase Physical Activity;Increase Strenth and Stamina Increase Physical Activity;Increase Strength and Stamina;Understanding of Exercise Prescription Increase Physical Activity;Increase Strength and Stamina   Comments Alexander Barton has had a successful first week of exercise. Reviewed home exercise with pt today.  Pt plans to continue walking at home for exercise.  We talked about adding a minute at least each week in addition to adding in warm up and cool down to his current routine.  Reviewed THR, pulse, RPE, sign and symptoms, and when to call 911 or MD.  Also discussed weather considerations and indoor options.  Pt voiced  understanding. Alexander Barton has increased his overall MET level and is progressing well with exercise.   Alexander Barton stated he has had knee pain for a while and has spoken to his Dr.  He is going to schedule a PT evaluation. Alexander Barton is progressing well with exercise.  He has increased overall MET level and is up to 5 lb strength training.   Expected Outcomes Short - Alexander Barton will continue to attend regularly.  Long - Alexander Barton will develop regular exercise as a habit. Short: Start increasing time of exercise outside of rehab to work up 62mn.  Long: Regular exercise for 353m a day. Short - [employer] [place] continue to progress during LW.  Long - [employer] [place] exercise independently after completing LW.  Short - [employer] [place] begin PT for his knee.  Long - [employer] [place] be able to exercise without knee pain. Short - [employer] [place] continue to progress and see improved fitness. Long - [employer] [place] maintain exercise when he finishes LW.   [place] 10/29/16 1538 11/12/16 1418           Exercise Goal Re-Evaluation   Exercise Goals Review Increase Physical Activity;Increase Strength and Stamina;Able to understand and use rate of perceived exertion (RPE) scale Increase Physical Activity;Increase Strength and Stamina;Able to understand and use rate of perceived exertion (RPE) scale      Comments Alexander Barton continues to increase levels on RB and RPE is 11 so staff will have him try the elliptical next time.   [facility] [place] progressed well with exercise.  He has recently added the elliptical instead of RB.  he has increasd weight strength training to 6 lb.      Expected Outcomes Short - [employer] [place] add elliptical instead of RB.  Long - [employer] [place] continue to improve his overall fitness. Short - [employer] [place] continue with current program and finish LW.  Long - [employer] [place] continue to exercise and maintain fitness on his own.         Discharge Exercise Prescription (Final Exercise Prescription Changes):     Exercise Prescription Changes - 11/12/16 1400      Response to Exercise  Blood Pressure (Admit) 124/64   Blood Pressure (Exit) 122/60   Heart Rate (Admit) 70 bpm   Heart Rate (Exercise) 106 bpm   Heart Rate (Exit) 75 bpm   Oxygen Saturation (Admit) 96 %   Oxygen Saturation (Exercise) 96 %   Oxygen Saturation (Exit) 98 %   Rating of Perceived Exertion (Exercise) 15   Perceived Dyspnea (Exercise) 5   Symptoms none   Duration Continue with 45 min of aerobic exercise without signs/symptoms of physical distress.   Intensity THRR unchanged     Progression   Progression Continue to progress workloads to maintain intensity without signs/symptoms of physical distress.   Average METs 3.8     Resistance Training   Training Prescription Yes   Weight 5 lb   Reps 10-15      Nutrition:  Target Goals: Understanding of nutrition guidelines, daily intake of sodium <1520m, cholesterol <2069m calories 30% from fat and 7% or less from saturated fats, daily to have 5 or more servings of fruits and vegetables.  Biometrics:     Pre Biometrics - 08/12/16 1312      Pre Biometrics   Height '6\' 1"'  (1.854 m)   Weight 227 lb 3.2 oz (103.1 kg)   Waist Circumference 46.5 inches   Hip Circumference 45 inches   Waist to Hip Ratio 1.03 %   BMI (Calculated) 30         Post Biometrics - 11/21/16 1057       Post  Biometrics   Height '6\' 1"'  (1.854 m)   Weight 230 lb (104.3 kg)   Waist Circumference 46 inches   Hip Circumference 45.5 inches   Waist to Hip Ratio 1.01 %   BMI (Calculated) 30.35      Nutrition Therapy Plan and Nutrition Goals:     Nutrition Therapy & Goals - 08/12/16 1222      Intervention Plan   Intervention Prescribe, educate and counsel regarding individualized specific dietary modifications aiming towards targeted core components such as weight, hypertension, lipid management, diabetes, heart failure and other comorbidities.   Expected Outcomes Short Term Goal: Understand basic principles of dietary content, such as calories, fat, sodium,  cholesterol and nutrients.;Short Term Goal: A plan has been developed with personal nutrition goals set during dietitian appointment.;Long Term Goal: Adherence to prescribed nutrition plan.      Nutrition Discharge: Rate Your Plate Scores:   Nutrition Goals Re-Evaluation:     Nutrition Goals Re-Evaluation    Row Name 09/11/16 0844 10/10/16 1247 11/19/16 1505         Goals   Current Weight  -  - 229 lb 8 oz (104.1 kg)     Nutrition Goal Meet with dietician to work on weight loss diet Eat more fruits and vegetables. Lose weight by eating a healthy diet     Comment Appointment scheduled for 8/24 at [employer] He plans to take his wife with him [place] with the RD and is working towards 5 servings fruit and veggies per day.  His weight has maintained and he would like to lose. [place] been eating better since his visit with the DiVintonHe was drinking 48 ounces of water before class and has cut back a little considering the amount he was drinking.     Expected Outcome Short: Continue to work on [employer] Long: Meet wiht dietician Short - [place] continue his healthier dietary changes.  Long - [place] acheive weight loss goals. Short: lose  weight from dietary changes. Long: maintain a healthy diet to reach weight goal.        Nutrition Goals Discharge (Final Nutrition Goals Re-Evaluation):     Nutrition Goals Re-Evaluation - 11/19/16 1505      Goals   Current Weight 229 lb 8 oz (104.1 kg)   Nutrition Goal Lose weight by eating a healthy diet   Comment Alexander Barton has been eating better since his visit with the Dietician. He was drinking 48 ounces of water before class and has cut back a little considering the amount he was drinking.   Expected Outcome Short: lose weight from dietary changes. Long: maintain a healthy diet to reach weight goal.      Psychosocial: Target Goals: Acknowledge presence or absence of significant depression and/or stress, maximize coping skills, provide  positive support system. Participant is able to verbalize types and ability to use techniques and skills needed for reducing stress and depression.   Initial Review & Psychosocial Screening:     Initial Psych Review & Screening - 08/12/16 1225      Initial Review   Current issues with History of Depression;Current Sleep Concerns  Over 20 years ago with depression history.  Sleep up several times a night, sometimes hard to get back to sleep.  Will use over the counter aid about 1-2 times a month to get back to sleep.      Family Dynamics   Good Support System? Yes  WOnderful wife of almost 58 years, children     Barriers   Psychosocial barriers to participate in program There are no identifiable barriers or psychosocial needs.;The patient should benefit from training in stress management and relaxation.     Screening Interventions   Interventions Encouraged to exercise;To provide support and resources with identified psychosocial needs      Quality of Life Scores:     Quality of Life - 11/19/16 1103      Quality of Life Scores   Health/Function Pre 19.2 %   Health/Function Post 19.09 %   Health/Function % Change -0.57 %   Socioeconomic Pre 26.79 %   Socioeconomic Post 23.56 %   Socioeconomic % Change  -12.06 %   Psych/Spiritual Pre 27.86 %   Psych/Spiritual Post 22.5 %   Psych/Spiritual % Change -19.24 %   Family Pre 30 %   Family Post 28.5 %   Family % Change -5 %   GLOBAL Pre 24.13 %   GLOBAL Post 22.1 %   GLOBAL % Change -8.41 %      PHQ-9: Recent Review Flowsheet Data    Depression screen [facility] 2/9 11/19/2016 11/05/2016 08/12/2016 12/05/2015   Decreased Interest 0 1 0 0   Down, Depressed, Hopeless 0 0 0 0   PHQ - 2 Score 0 1 0 0   Altered sleeping 1 2 0 -   Tired, decreased energy '2 3 2 ' -   Change in appetite 1 0 0 -   Feeling bad or failure about yourself  0 0 0 -   Trouble concentrating 1 2 0 -   Moving slowly or fidgety/restless 0 0 0 -   Suicidal thoughts  0 0 0 -   PHQ-9 Score '5 8 2 ' -   Difficult doing work/chores Not difficult at all Somewhat difficult Not difficult at all -     Interpretation of Total Score  Total Score Depression Severity:  1-4 = Minimal depression, 5-9 = Mild depression, 10-14 = Moderate depression, 15-19 = Moderately  severe depression, 20-27 = Severe depression   Psychosocial Evaluation and Intervention:     Psychosocial Evaluation - 09/01/16 1132      Psychosocial Evaluation & Interventions   Interventions Encouraged to exercise with the program and follow exercise prescription;Relaxation education   Comments Counselor met with Mr. Bell[facility]) today for initial psychosocial evaluation.  He is a 66 year old who was diagnosed with COPD in 2004; but reports his symptoms have recently been much worse.  Alexander Barton has a strong support system with a spouse of 50 years, a son who lives locally and a daughter who recently moved to [place].  Alexander Barton is also actively involved in his local church.  He has multiple health issues with a heart valve replacement; (2) TIA's - one in 2009 and one in 2012 and a stroke in 2015.Marland Kitchen  He also struggles with arthritis.  Alexander Barton reports sleeping "okay" with some occasional use of OTC sleep aid PRN.  He has a good appetite.  Alexander Barton reports a history of depression in 1997; subsequent to a job loss; but has had no other symptoms since or currently.  He reports his mood is generally positive most of the time and he has minimal stres other than his health issues at this time.  Alexander Barton has goals to exercise consistently and improve his breathing.  He also wants to increase his cardio and pulmonary functioning overall.  STaff will follow with Alexander Barton throughout the course of this program.     Expected Outcomes Alexander Barton will benefit from consistent exercise to achieve his stated goals.  He will also benefit from all of the educational aspects to learn to manage his medical issues better.   Continue Psychosocial Services  Follow up  required by staff      Psychosocial Re-Evaluation:     Psychosocial Re-Evaluation    [place] Name 09/11/16 0845 10/08/16 1140 11/05/16 1115         Psychosocial Re-Evaluation   Current issues with Current Sleep Concerns  -  -     Comments Alexander Barton has no major stressors at htis time.  He continues to watch his diet and health given his history.  He does not sleep well, but does try to take a supplement to help 2-3x a week.  He has been taking his time getting up in the middle of the night so he doesnt pass out on his way to the bathroom.  He has enjoyed class and has found the education classes to very beneficial. Counselor follow up with Alexander Barton today reporting progress noted since coming into this program by more stamina and strength in his ability to do his normal activities like vacuuming and washing the cars.  He reports the educational pieces have been extremely helpful with learning to pace himself and using the pursed lips for breathing.  He wishes he was able to sleep more than 5-6 hours/night but notes this is an improvement.  His appetite is good and he is learning to eat smaller meals more frequently vs. larger meals and this is more satisfying for him.  Alexander Barton remains in a positive mood and has minimal stress in his life currently.  Counselor commended him on the progress noted and his commitment to consistent exercise and positive self care.   Counselor follow with Alexander Barton today stating he is increasing in intensity on the equipment and proud of his progress.  However, he continues to have little energy and reports feeling more tired and gaining weight since coming into  the program.  His sleep is still a problem but has improved.  Counselor had Alexander Barton complete a new PHQ-9 to compare and his scores have increased to an "8" vs a "2" indicating more mild depression may be present.  Counselor discussed this with Alexander Barton stating he had expected greater progress by now and is a little disappointed in his energy  level and his weight gain.  Counselor encouraged Alexander Barton to speak with his Dr. about these concerns.  Counselor also [employer] on his ability to increase his intensity levels in class.  He will continue to be followed by staff.       Expected Outcomes Short: Continue to attend classes to learn more.  Long: Continue to look out for health and maintain positive attitude. Alexander Barton will continue to exercise consistently to maintain the progress he has made.  He also will continue to practice positive self care overall. Alexander Barton will continue to exercise consistently to maintain his progress and achieve his goals.  He will speak with his Dr. about his decreased energy levels and his weight gain since coming into this program.       Interventions Stress management education;Encouraged to attend Pulmonary Rehabilitation for the exercise;Relaxation education  -  -     Continue Psychosocial Services  Follow up required by staff  - Follow up required by staff        Psychosocial Discharge (Final Psychosocial Re-Evaluation):     Psychosocial Re-Evaluation - 11/05/16 1115      Psychosocial Re-Evaluation   Comments Counselor follow with Alexander Barton today stating he is increasing in intensity on the equipment and proud of his progress.  However, he continues to have little energy and reports feeling more tired and gaining weight since coming into the program.  His sleep is still a problem but has improved.  Counselor had Alexander Barton complete a new PHQ-9 to compare and his scores have increased to an "8" vs a "2" indicating more mild depression may be present.  Counselor discussed this with Alexander Barton stating he had expected greater progress by now and is a little disappointed in his energy level and his weight gain.  Counselor encouraged Alexander Barton to speak with his Dr. about these concerns.  Counselor also [employer] on his ability to increase his intensity levels in class.  He will continue to be followed by staff.     Expected Outcomes Alexander Barton  will continue to exercise consistently to maintain his progress and achieve his goals.  He will speak with his Dr. about his decreased energy levels and his weight gain since coming into this program.     Continue Psychosocial Services  Follow up required by staff      Education: Education Goals: Education classes will be provided on a weekly basis, covering required topics. Participant will state understanding/return demonstration of topics presented.  Learning Barriers/Preferences:     Learning Barriers/Preferences - 08/12/16 1232      Learning Barriers/Preferences   Learning Barriers None   Learning Preferences None      Education Topics: Initial Evaluation Education: - Verbal, written and demonstration of respiratory meds, RPE/PD scales, oximetry and breathing techniques. Instruction on use of nebulizers and MDIs: cleaning and proper use, rinsing mouth with steroid doses and importance of monitoring MDI activations.   Pulmonary Rehab from 11/19/2016 in [facility] Cardiac and Pulmonary Rehab  Date  08/12/16  Educator  SB  Instruction Review Code (retired)  2- meets goals/outcomes      General Nutrition Guidelines/Fats  and Fiber: -Group instruction provided by verbal, written material, models and posters to present the general guidelines for heart healthy nutrition. Gives an explanation and review of dietary fats and fiber.   Pulmonary Rehab from 11/19/2016 in [facility] Cardiac and Pulmonary Rehab  Date  11/17/16  Educator  CR  Instruction Review Code  1- Verbalizes Understanding      Controlling Sodium/Reading Food Labels: -Group verbal and written material supporting the discussion of sodium use in heart healthy nutrition. Review and explanation with models, verbal and written materials for utilization of the food label.   Pulmonary Rehab from 11/19/2016 in [facility] Cardiac and Pulmonary Rehab  Date  09/29/16  Educator  CR  Instruction Review Code  1- Verbalizes Understanding       Exercise Physiology & Risk Factors: - Group verbal and written instruction with models to review the exercise physiology of the cardiovascular system and associated critical values. Details cardiovascular disease risk factors and the goals associated with each risk factor.   Pulmonary Rehab from 11/19/2016 in [facility] Cardiac and Pulmonary Rehab  Date  11/07/16  Educator  Nada Maclachlan, EP  Instruction Review Code  2- Demonstrated Understanding      Aerobic Exercise & Resistance Training: - Gives group verbal and written discussion on the health impact of inactivity. On the components of aerobic and resistive training programs and the benefits of this training and how to safely progress through these programs.   Pulmonary Rehab from 11/19/2016 in [facility] Cardiac and Pulmonary Rehab  Date  08/29/16  Educator  [facility]  Instruction Review Code  2- Demonstrated Understanding      Flexibility, Balance, General Exercise Guidelines: - Provides group verbal and written instruction on the benefits of flexibility and balance training programs. Provides general exercise guidelines with specific guidelines to those with heart or lung disease. Demonstration and skill practice provided.   Pulmonary Rehab from 11/19/2016 in [facility] Cardiac and Pulmonary Rehab  Date  09/17/16  Educator  [facility]  Instruction Review Code  2- Demonstrated Understanding      Stress Management: - Provides group verbal and written instruction about the health risks of elevated stress, cause of high stress, and healthy ways to reduce stress.   Pulmonary Rehab from 11/19/2016 in [facility] Cardiac and Pulmonary Rehab  Date  10/22/16  Educator  [facility]  Instruction Review Code  1- Verbalizes Understanding      Depression: - Provides group verbal and written instruction on the correlation between heart/lung disease and depressed mood, treatment options, and the stigmas associated with seeking treatment.   Pulmonary Rehab from 11/19/2016 in [facility]  Cardiac and Pulmonary Rehab  Date  09/24/16  Educator  [facility]  Instruction Review Code (retired)  2- meets goals/outcomes      Exercise & Equipment Safety: - Individual verbal instruction and demonstration of equipment use and safety with use of the equipment.   Pulmonary Rehab from 11/19/2016 in [facility] Cardiac and Pulmonary Rehab  Date  08/12/16  Educator  SB  Instruction Review Code  1- Verbalizes Understanding      Infection Prevention: - Provides verbal and written material to individual with discussion of infection control including proper hand washing and proper equipment cleaning during exercise session.   Pulmonary Rehab from 11/19/2016 in [facility] Cardiac and Pulmonary Rehab  Date  08/12/16  Educator  Sb  Instruction Review Code  1- Verbalizes Understanding      Falls Prevention: - Provides verbal and written material to individual with discussion of falls prevention  and safety.   Pulmonary Rehab from 11/19/2016 in [facility] Cardiac and Pulmonary Rehab  Date  08/12/16  Educator  SB  Instruction Review Code (retired)  2- meets goals/outcomes      Diabetes: - Individual verbal and written instruction to review signs/symptoms of diabetes, desired ranges of glucose level fasting, after meals and with exercise. Advice that pre and post exercise glucose checks will be done for 3 sessions at entry of program.   Chronic Lung Diseases: - Group verbal and written instruction to review new updates, new respiratory medications, new advancements in procedures and treatments. Provide informative websites and "800" numbers of self-education.   Pulmonary Rehab from 11/19/2016 in [facility] Cardiac and Pulmonary Rehab  Date  11/05/16  Educator  [facility]  Instruction Review Code  1- Verbalizes Understanding      Lung Procedures: - Group verbal and written instruction to describe testing methods done to diagnose lung disease. Review the outcome of test results. Describe the treatment choices: Pulmonary  Function Tests, ABGs and oximetry.   Energy Conservation: - Provide group verbal and written instruction for methods to conserve energy, plan and organize activities. Instruct on pacing techniques, use of adaptive equipment and posture/positioning to relieve shortness of breath.   Pulmonary Rehab from 11/19/2016 in [facility] Cardiac and Pulmonary Rehab  Date  10/15/16  Educator  [facility]  Instruction Review Code  1- Verbalizes Understanding      Triggers: - Group verbal and written instruction to review types of environmental controls: home humidity, furnaces, filters, dust mite/pet prevention, HEPA vacuums. To discuss weather changes, air quality and the benefits of nasal washing.   Exacerbations: - Group verbal and written instruction to provide: warning signs, infection symptoms, calling MD promptly, preventive modes, and value of vaccinations. Review: effective airway clearance, coughing and/or vibration techniques. Create an Sports administrator.   Oxygen: - Individual and group verbal and written instruction on oxygen therapy. Includes supplement oxygen, available portable oxygen systems, continuous and intermittent flow rates, oxygen safety, concentrators, and Medicare reimbursement for oxygen.   Respiratory Medications: - Group verbal and written instruction to review medications for lung disease. Drug class, frequency, complications, importance of spacers, rinsing mouth after steroid MDI's, and proper cleaning methods for nebulizers.   AED/CPR: - Group verbal and written instruction with the use of models to demonstrate the basic use of the AED with the basic ABC's of resuscitation.   Breathing Retraining: - Provides individuals verbal and written instruction on purpose, frequency, and proper technique of diaphragmatic breathing and pursed-lipped breathing. Applies individual practice skills.   Anatomy and Physiology of the Lungs: - Group verbal and written instruction with the use of  models to provide basic lung anatomy and physiology related to function, structure and complications of lung disease.   Pulmonary Rehab from 11/19/2016 in [facility] Cardiac and Pulmonary Rehab  Date  10/08/16  Educator  [facility]  Instruction Review Code  1- Verbalizes Understanding      Anatomy & Physiology of the Heart: - Group verbal and written instruction and models provide basic cardiac anatomy and physiology, with the coronary electrical and arterial systems. Review of: AMI, Angina, Valve disease, Heart Failure, Cardiac Arrhythmia, Pacemakers, and the ICD.   Heart Failure: - Group verbal and written instruction on the basics of heart failure: signs/symptoms, treatments, explanation of ejection fraction, enlarged heart and cardiomyopathy.   Sleep Apnea: - Individual verbal and written instruction to review Obstructive Sleep Apnea. Review of risk factors, methods for diagnosing and types of masks and  machines for OSA.   Anxiety: - Provides group, verbal and written instruction on the correlation between heart/lung disease and anxiety, treatment options, and management of anxiety.   Relaxation: - Provides group, verbal and written instruction about the benefits of relaxation for patients with heart/lung disease. Also provides patients with examples of relaxation techniques.   Pulmonary Rehab from 11/19/2016 in [facility] Cardiac and Pulmonary Rehab  Date  11/19/16  Educator  [facility]  Instruction Review Code  1- Verbalizes Understanding      Cardiac Medications: - Group verbal and written instruction to review commonly prescribed medications for heart disease. Reviews the medication, class of the drug, and side effects.   Pulmonary Rehab from 11/19/2016 in [facility] Cardiac and Pulmonary Rehab  Date  10/10/16  Educator  C. Scribner  Instruction Review Code  1- Verbalizes Understanding      Know Your Numbers: -Group verbal and written instruction about important numbers in your health.  Review of  Cholesterol, Blood Pressure, Diabetes, and BMI and the role they play in your overall health.   Other: -Provides group and verbal instruction on various topics (see comments)    Knowledge Questionnaire Score:     Knowledge Questionnaire Score - 11/19/16 1058      Knowledge Questionnaire Score   Pre Score 9/10   Post Score 9/10  Reviewed with patient       Core Components/Risk Factors/Patient Goals at Admission:     Personal Goals and Risk Factors at Admission - 08/12/16 1222      Core Components/Risk Factors/Patient Goals on Admission    Weight Management Yes   Intervention Weight Management: Develop a combined nutrition and exercise program designed to reach desired caloric intake, while maintaining appropriate intake of nutrient and fiber, sodium and fats, and appropriate energy expenditure required for the weight goal.   Admit Weight 227 lb 3.2 oz (103.1 kg)   Goal Weight: Short Term 224 lb (101.6 kg)   Goal Weight: Long Term 180 lb (81.6 kg)   Expected Outcomes Short Term: Continue to assess and modify interventions until short term weight is achieved;Long Term: Adherence to nutrition and physical activity/exercise program aimed toward attainment of established weight goal;Weight Loss: Understanding of general recommendations for a balanced deficit meal plan, which promotes 1-2 lb weight loss per week and includes a negative energy balance of [phone] kcal/d   Improve shortness of breath with ADL's Yes   Intervention Provide education, individualized exercise plan and daily activity instruction to help decrease symptoms of SOB with activities of daily living.   Expected Outcomes Short Term: Achieves a reduction of symptoms when performing activities of daily living.   Develop more efficient breathing techniques such as purse lipped breathing and diaphragmatic breathing; and practicing self-pacing with activity Yes   Intervention Provide education, demonstration and support  about specific breathing techniuqes utilized for more efficient breathing. Include techniques such as pursed lipped breathing, diaphragmatic breathing and self-pacing activity.   Expected Outcomes Short Term: Participant will be able to demonstrate and use breathing techniques as needed throughout daily activities.   Increase knowledge of respiratory medications and ability to use respiratory devices properly  Yes   Intervention Provide education and demonstration as needed of appropriate use of medications, inhalers, and oxygen therapy.   Expected Outcomes Short Term: Achieves understanding of medications use. Understands that oxygen is a medication prescribed by physician. Demonstrates appropriate use of inhaler and oxygen therapy.   Hypertension Yes   Intervention Provide education on lifestyle modifcations including  regular physical activity/exercise, weight management, moderate sodium restriction and increased consumption of fresh fruit, vegetables, and low fat dairy, alcohol moderation, and smoking cessation.;Monitor prescription use compliance.   Expected Outcomes Short Term: Continued assessment and intervention until BP is < 140/71m HG in hypertensive participants. < 130/860mHG in hypertensive participants with diabetes, heart failure or chronic kidney disease.;Long Term: Maintenance of blood pressure at goal levels.   Lipids Yes  Unable to tolerate statins   Intervention Provide education and support for participant on nutrition & aerobic/resistive exercise along with prescribed medications to achieve LDL <7038mHDL >64m90m Expected Outcomes Short Term: Participant states understanding of desired cholesterol values and is compliant with medications prescribed. Participant is following exercise prescription and nutrition guidelines.;Long Term: Cholesterol controlled with medications as prescribed, with individualized exercise RX and with personalized nutrition plan. Value goals: LDL < 70mg44mDL > 40 mg.      Core Components/Risk Factors/Patient Goals Review:      Goals and Risk Factor Review    Row Name 09/10/16 1431 10/10/16 1249 11/19/16 1510         Core Components/Risk Factors/Patient Goals Review   Personal Goals Review Weight Management/Obesity;Hypertension;Lipids Weight Management/Obesity;Improve shortness of breath with ADL's Weight Management/Obesity;Improve shortness of breath with ADL's     Review Alexander Barton's weight has been holding steady around 230lbs.  He does fluctuate up and down some.  His blood pressures have been doing well and he is checking them at home.  He has plans to purchase a Fitbit like device that will monitor his heart rate, blood pressure, and oxygen satuarations.  He has not had his lipids checked recently.  He is using nutritional supplements to help improve his cholesterol numbers.  Alexander Barton Alexander Landmarknot lost weight but is maintaining.  He states he can do ADLS now that he could not before such as vacuuming, laundry, washing cars,etc.   Alexander Barton Alexander Landmarked some weight throughout the program but has since lost some due to exercising. He is able to do more at home since he started the program.     Expected Outcomes Short: Continue to work on weight loss.  Long: Continue to work on risk factor modificiations. Short - Alexander Barton Alexander Barton continue to attend LW and build stamina.  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 Short: improve SOB with ADL's. Long: Maintain ADL's with exercise routine at home.        Core Components/Risk Factors/Patient Goals at Discharge (Final Review):      Goals and Risk Factor Review - 11/19/16 1510      Core Components/Risk Factors/Patient Goals Review   Personal Goals Review Weight Management/Obesity;Improve shortness of breath with ADL's   Review Alexander Barton gained some weight throughout the program but has since lost some due to exercising. He is able to do more at home since he started the program.    Expected Outcomes Short: improve SOB with ADL's. Long: Maintain ADL's with exercise routine at home.      ITP Comments:     ITP Comments    Row Name 08/12/16 1213 08/22/16 1029 09/01/16 0900 09/26/16 1025 09/29/16 0827   ITP Comments Medical review completed today with Initial ITP created.  Disgnosis documnetation can be found [facility]2016 Patient attened Education "Know Your Numbers" 30 day review completed ITP sent to Dr. Bert Ramonita LabDr. Mark Emily Filbertctor of [facility]in[facility] with ITP unless changes are made by physician.  Gwyndolyn Saxon met with the Pulmonary Rehab Registered Dietician today.  30 day review completed. ITP sent to Dr. Emily Filbert Director of [place]. Continue with ITP unless changes are made by physician.     [place] Name 10/27/16 0830 11/24/16 0822         ITP Comments 30 day review completed. ITP sent to Dr. Emily Filbert Director of [place]. Continue with ITP unless changes are made by physician.   30 day review completed. ITP sent to Dr. Emily Filbert Director of [place]. Continue with ITP unless changes are made by physician.           Comments: 30 day review

## 2016-11-24 NOTE — Telephone Encounter (Signed)
On your desk to sign

## 2016-11-24 NOTE — Telephone Encounter (Signed)
Pt dropped off fitness form to be filled out. Placed in Dr.Sonnenbergs color folder upfront

## 2016-11-24 NOTE — Progress Notes (Signed)
Daily Session Note  Patient Details  Name: Alexander Barton MRN: [id number] Date of Birth: [birth year] Referring Provider:    Encounter Date: 11/24/2016  Check In:     Session Check In - 11/24/16 1014      Check-In   Location [facility]   Staff Present Nada Maclachlan, BA, ACSM CEP, Exercise Physiologist;Kelly Amedeo Plenty, BS, ACSM CEP, Exercise Physiologist;Adell Panek Flavia Shipper   Supervising physician immediately available to respond to emergencies [facility] immediately available ER MD   Physician(s) Dr. Kerman Passey and Mariea Clonts   Medication changes reported     No   Fall or balance concerns reported    No   Warm-up and Cool-down Performed as group-led instruction   Resistance Training Performed Yes   VAD Patient? No     Pain Assessment   Currently in Pain? No/denies   Multiple Pain Sites No         History  Smoking Status  . Never Smoker  Smokeless Tobacco  . Never Used    Goals Met:  Independence with exercise equipment Exercise tolerated well No report of cardiac concerns or symptoms Strength training completed today  Goals Unmet:  Not Applicable  Comments: Pt able to follow exercise prescription today without complaint.  Will continue to monitor for progression.   Dr. Emily Filbert is Medical Director for [place] and [facility].

## 2016-11-26 ENCOUNTER — Ambulatory Visit: Payer: Medicare Other | Admitting: Family Medicine

## 2016-11-26 DIAGNOSIS — R0600 Dyspnea, unspecified: Secondary | ICD-10-CM | POA: Diagnosis not present

## 2016-11-26 DIAGNOSIS — J449 Chronic obstructive pulmonary disease, unspecified: Secondary | ICD-10-CM

## 2016-11-26 NOTE — Progress Notes (Signed)
Daily Session Note  Patient Details  Name: Alexander Barton MRN: [id number] Date of Birth: 1951/01/12 Referring Provider:    Encounter Date: 11/26/2016  Check In:     Session Check In - 11/26/16 1015      Check-In   Location [facility]   Staff Present Alberteen Sam, MA, ACSM RCEP, Exercise Physiologist;Amanda Oletta Darter, BA, ACSM CEP, Exercise Physiologist;Melinda Pottinger Flavia Shipper   Supervising physician immediately available to respond to emergencies [facility] immediately available ER MD   Physician(s) Drs. Willimas and Schaevitz   Medication changes reported     No   Fall or balance concerns reported    No   Warm-up and Cool-down Performed on first and last piece of equipment   Resistance Training Performed Yes   VAD Patient? No     Pain Assessment   Currently in Pain? No/denies         History  Smoking Status  . Never Smoker  Smokeless Tobacco  . Never Used    Goals Met:  Proper associated with RPD/PD & O2 Sat Independence with exercise equipment Personal goals reviewed No report of cardiac concerns or symptoms Strength training completed today  Goals Unmet:  Not Applicable  Comments: Pt able to follow exercise prescription today without complaint.  Will continue to monitor for progression.   Dr. Emily Filbert is Medical Director for [place] and [facility].

## 2016-11-28 DIAGNOSIS — J449 Chronic obstructive pulmonary disease, unspecified: Secondary | ICD-10-CM

## 2016-11-28 DIAGNOSIS — R0600 Dyspnea, unspecified: Secondary | ICD-10-CM | POA: Diagnosis not present

## 2016-11-28 NOTE — Progress Notes (Signed)
Discharge Progress Report  Patient Details  Name: Alexander Barton MRN: [id number] Date of Birth: Jun 28, 1950 Referring Provider:     Number of Visits: 36/36  Reason for Discharge:  Patient reached a stable level of exercise. Patient independent in their exercise. Patient has met program and personal goals.  Smoking History:  History  Smoking Status  . Never Smoker  Smokeless Tobacco  . Never Used    Diagnosis:  Chronic obstructive pulmonary disease, unspecified COPD type ([place])  ADL UCSD:     Pulmonary Assessment Scores    Row Name 08/12/16 1233 08/12/16 1527 09/29/16 1132     ADL UCSD   ADL Phase Entry  - Mid   SOB Score total 63  - 44   Rest 2  - 0   Walk 3  - 1   Stairs 5  - 5   Bath 3  - 1   Dress 2  - 1   Shop 2  - 1     mMRC Score   mMRC Score  - 1  -   Row Name 11/19/16 1056         ADL UCSD   ADL Phase Exit     SOB Score total 46     Rest 0     Walk 2     Stairs 5     Bath 2     Dress 1     Shop 1        Initial Exercise Prescription:     Initial Exercise Prescription - 08/12/16 1300      Date of Initial Exercise RX and Referring Provider   Date 08/12/16     Treadmill   MPH 2.5   Grade 0.5   Minutes 15   METs 2.9     Recumbant Bike   Level 2   RPM 60   Watts 30   Minutes 15   METs 2.9     T5 Nustep   Level 2   SPM 80   Minutes 15   METs 2.9     Prescription Details   Frequency (times per week) 3   Duration Progress to 45 minutes of aerobic exercise without signs/symptoms of physical distress     Intensity   THRR 40-80% of Max Heartrate 96-134   Ratings of Perceived Exertion 11-15   Perceived Dyspnea 0-4     Resistance Training   Training Prescription Yes   Weight 3 lb   Reps 10-15      Discharge Exercise Prescription (Final Exercise Prescription Changes):     Exercise Prescription Changes - 11/26/16 1200      Response to Exercise   Blood Pressure (Admit) 118/70   Blood Pressure (Exit) 126/64    Heart Rate (Admit) 75 bpm   Heart Rate (Exercise) 117 bpm   Heart Rate (Exit) 80 bpm   Oxygen Saturation (Admit) 98 %   Oxygen Saturation (Exercise) 95 %   Oxygen Saturation (Exit) 94 %   Rating of Perceived Exertion (Exercise) 13   Perceived Dyspnea (Exercise) 1   Symptoms none   Duration Continue with 45 min of aerobic exercise without signs/symptoms of physical distress.   Intensity THRR unchanged     Progression   Progression Continue to progress workloads to maintain intensity without signs/symptoms of physical distress.      Functional Capacity:     6 Minute Walk    Row Name 08/12/16 1313 08/12/16 1526 11/21/16 1058  6 Minute Walk   Phase Initial Initial Discharge   Distance 1400 feet 1400 feet 1933 feet   Distance % Change  -  - 38 %   Distance Feet Change  -  - 533 ft   Walk Time 6 minutes 6 minutes 6 minutes   # of Rest Breaks 0  - 0   MPH 2.65  - 3.66   METS 3.03  - 4.67   RPE '14 14 13   ' Perceived Dyspnea  2.5 2.5 2   VO2 Peak 10.61  - 16.33   Symptoms No No No   Resting HR 58 bpm 58 bpm 100 bpm   Resting BP 106/64 106/64 134/62   Resting Oxygen Saturation   -  - 97 %   Exercise Oxygen Saturation  during 6 min walk  -  - 96 %   Max Ex. HR 95 bpm 95 bpm 128 bpm   Max Ex. BP 120/58 120/58 166/64     Interval HR   Baseline HR (retired) 58  -  -   1 Minute HR 64  - 98   2 Minute HR 68  - 114   3 Minute HR 68  - 124   4 Minute HR  -  - 128   5 Minute HR 95  - 127   6 Minute HR 93  - 127   2 Minute Post HR  -  - 91   Interval Heart Rate? Yes  - Yes     Interval Oxygen   Interval Oxygen? Yes  -  -   Baseline Oxygen Saturation % 96 %  - 97 %   Resting Liters of Oxygen 0 L  -  -   1 Minute Oxygen Saturation % 80 %  - 96 %   1 Minute Liters of Oxygen 0 L  - 0 L   2 Minute Oxygen Saturation % 80 %  - 97 %   2 Minute Liters of Oxygen 0 L  - 0 L   3 Minute Oxygen Saturation % 83 %  - 98 %   3 Minute Liters of Oxygen 0 L  - 0 L   4 Minute Oxygen  Saturation %  -  - 97 %   4 Minute Liters of Oxygen 0 L  - 0 L   5 Minute Oxygen Saturation % 97 %  - 96 %   5 Minute Liters of Oxygen 0 L  - 0 L   6 Minute Oxygen Saturation % 100 %  - 97 %   6 Minute Liters of Oxygen 0 L  - 0 L   2 Minute Post Oxygen Saturation %  -  - 97 %   2 Minute Post Liters of Oxygen 0 L  - 0 L      Psychological, QOL, Others - Outcomes: PHQ 2/9: Depression screen [facility] 2/9 11/19/2016 11/05/2016 08/12/2016 12/05/2015  Decreased Interest 0 1 0 0  Down, Depressed, Hopeless 0 0 0 0  PHQ - 2 Score 0 1 0 0  Altered sleeping 1 2 0 -  Tired, decreased energy '2 3 2 ' -  Change in appetite 1 0 0 -  Feeling bad or failure about yourself  0 0 0 -  Trouble concentrating 1 2 0 -  Moving slowly or fidgety/restless 0 0 0 -  Suicidal thoughts 0 0 0 -  PHQ-9 Score '5 8 2 ' -  Difficult doing work/chores Not difficult at all Somewhat difficult  Not difficult at all -    Quality of Life:     Quality of Life - 11/19/16 1103      Quality of Life Scores   Health/Function Pre 19.2 %   Health/Function Post 19.09 %   Health/Function % Change -0.57 %   Socioeconomic Pre 26.79 %   Socioeconomic Post 23.56 %   Socioeconomic % Change  -12.06 %   Psych/Spiritual Pre 27.86 %   Psych/Spiritual Post 22.5 %   Psych/Spiritual % Change -19.24 %   Family Pre 30 %   Family Post 28.5 %   Family % Change -5 %   GLOBAL Pre 24.13 %   GLOBAL Post 22.1 %   GLOBAL % Change -8.41 %      Personal Goals: Goals established at orientation with interventions provided to work toward goal.     Personal Goals and Risk Factors at Admission - 08/12/16 1222      Core Components/Risk Factors/Patient Goals on Admission    Weight Management Yes   Intervention Weight Management: Develop a combined nutrition and exercise program designed to reach desired caloric intake, while maintaining appropriate intake of nutrient and fiber, sodium and fats, and appropriate energy expenditure required for the weight  goal.   Admit Weight 227 lb 3.2 oz (103.1 kg)   Goal Weight: Short Term 224 lb (101.6 kg)   Goal Weight: Long Term 180 lb (81.6 kg)   Expected Outcomes Short Term: Continue to assess and modify interventions until short term weight is achieved;Long Term: Adherence to nutrition and physical activity/exercise program aimed toward attainment of established weight goal;Weight Loss: Understanding of general recommendations for a balanced deficit meal plan, which promotes 1-2 lb weight loss per week and includes a negative energy balance of [id number] kcal/d   Improve shortness of breath with ADL's Yes   Intervention Provide education, individualized exercise plan and daily activity instruction to help decrease symptoms of SOB with activities of daily living.   Expected Outcomes Short Term: Achieves a reduction of symptoms when performing activities of daily living.   Develop more efficient breathing techniques such as purse lipped breathing and diaphragmatic breathing; and practicing self-pacing with activity Yes   Intervention Provide education, demonstration and support about specific breathing techniuqes utilized for more efficient breathing. Include techniques such as pursed lipped breathing, diaphragmatic breathing and self-pacing activity.   Expected Outcomes Short Term: Participant will be able to demonstrate and use breathing techniques as needed throughout daily activities.   Increase knowledge of respiratory medications and ability to use respiratory devices properly  Yes   Intervention Provide education and demonstration as needed of appropriate use of medications, inhalers, and oxygen therapy.   Expected Outcomes Short Term: Achieves understanding of medications use. Understands that oxygen is a medication prescribed by physician. Demonstrates appropriate use of inhaler and oxygen therapy.   Hypertension Yes   Intervention Provide education on lifestyle modifcations including regular physical  activity/exercise, weight management, moderate sodium restriction and increased consumption of fresh fruit, vegetables, and low fat dairy, alcohol moderation, and smoking cessation.;Monitor prescription use compliance.   Expected Outcomes Short Term: Continued assessment and intervention until BP is < 140/86m HG in hypertensive participants. < 130/825mHG in hypertensive participants with diabetes, heart failure or chronic kidney disease.;Long Term: Maintenance of blood pressure at goal levels.   Lipids Yes  Unable to tolerate statins   Intervention Provide education and support for participant on nutrition & aerobic/resistive exercise along with prescribed medications to achieve LDL <7094m  HDL >31m.   Expected Outcomes Short Term: Participant states understanding of desired cholesterol values and is compliant with medications prescribed. Participant is following exercise prescription and nutrition guidelines.;Long Term: Cholesterol controlled with medications as prescribed, with individualized exercise RX and with personalized nutrition plan. Value goals: LDL < 777m HDL > 40 mg.       Personal Goals Discharge:     Goals and Risk Factor Review    Row Name 09/10/16 1431 10/10/16 1249 11/19/16 1510         Core Components/Risk Factors/Patient Goals Review   Personal Goals Review Weight Management/Obesity;Hypertension;Lipids Weight Management/Obesity;Improve shortness of breath with ADL's Weight Management/Obesity;Improve shortness of breath with ADL's     Review Bill's weight has been holding steady around 230lbs.  He does fluctuate up and down some.  His blood pressures have been doing well and he is checking them at home.  He has plans to purchase a Fitbit like device that will monitor his heart rate, blood pressure, and oxygen satuarations.  He has not had his lipids checked recently.  He is using nutritional supplements to help improve his cholesterol numbers.  BiRush Landmarkas not lost weight but is  maintaining.  He states he can do ADLS now that he could not before such as vacuuming, laundry, washing cars,etc.   BiRush Landmarkained some weight throughout the program but has since lost some due to exercising. He is able to do more at home since he started the program.     Expected Outcomes Short: Continue to work on weight loss.  Long: Continue to work on risk factor modificiations. Short - BiRush Landmarkill continue to attend LW and build stamina.  Long - BiRush Landmarkill grCommercial Metals Companynd continue to maintain healthy lifestyle choices on his own. Short: improve SOB with ADL's. Long: Maintain ADL's with exercise routine at home.        Exercise Goals and Review:     Exercise Goals    Row Name 08/12/16 1313             Exercise Goals   Increase Physical Activity Yes       Intervention Provide advice, education, support and counseling about physical activity/exercise needs.;Develop an individualized exercise prescription for aerobic and resistive training based on initial evaluation findings, risk stratification, comorbidities and participant's personal goals.       Expected Outcomes Achievement of increased cardiorespiratory fitness and enhanced flexibility, muscular endurance and strength shown through measurements of functional capacity and personal statement of participant.       Increase Strength and Stamina Yes       Intervention Provide advice, education, support and counseling about physical activity/exercise needs.;Develop an individualized exercise prescription for aerobic and resistive training based on initial evaluation findings, risk stratification, comorbidities and participant's personal goals.       Expected Outcomes Achievement of increased cardiorespiratory fitness and enhanced flexibility, muscular endurance and strength shown through measurements of functional capacity and personal statement of participant.          Nutrition & Weight - Outcomes:     Pre Biometrics - 08/12/16 1312       Pre Biometrics   Height '6\' 1"'  (1.854 m)   Weight 227 lb 3.2 oz (103.1 kg)   Waist Circumference 46.5 inches   Hip Circumference 45 inches   Waist to Hip Ratio 1.03 %   BMI (Calculated) 30         Post Biometrics - 11/21/16 1057  Post  Biometrics   Height '6\' 1"'  (1.854 m)   Weight 230 lb (104.3 kg)   Waist Circumference 46 inches   Hip Circumference 45.5 inches   Waist to Hip Ratio 1.01 %   BMI (Calculated) 30.35      Nutrition:     Nutrition Therapy & Goals - 08/12/16 1222      Intervention Plan   Intervention Prescribe, educate and counsel regarding individualized specific dietary modifications aiming towards targeted core components such as weight, hypertension, lipid management, diabetes, heart failure and other comorbidities.   Expected Outcomes Short Term Goal: Understand basic principles of dietary content, such as calories, fat, sodium, cholesterol and nutrients.;Short Term Goal: A plan has been developed with personal nutrition goals set during dietitian appointment.;Long Term Goal: Adherence to prescribed nutrition plan.      Nutrition Discharge:   Education Questionnaire Score:     Knowledge Questionnaire Score - 11/19/16 1058      Knowledge Questionnaire Score   Pre Score 9/10   Post Score 9/10  Reviewed with patient      Goals reviewed with patient; copy given to patient.

## 2016-11-28 NOTE — Patient Instructions (Signed)
Discharge Instructions  Patient Details  Name: Alexander Barton MRN: [id number] Date of Birth: December 15, 1950 Referring Provider:  Flora Lipps, MD   Number of Visits: 36/36  Reason for Discharge:  Patient reached a stable level of exercise. Patient independent in their exercise. Patient has met program and personal goals.  Smoking History:  History  Smoking Status  . Never Smoker  Smokeless Tobacco  . Never Used    Diagnosis:  Chronic obstructive pulmonary disease, unspecified COPD type ([place])  Initial Exercise Prescription:     Initial Exercise Prescription - 08/12/16 1300      Date of Initial Exercise RX and Referring Provider   Date 08/12/16     Treadmill   MPH 2.5   Grade 0.5   Minutes 15   METs 2.9     Recumbant Bike   Level 2   RPM 60   Watts 30   Minutes 15   METs 2.9     T5 Nustep   Level 2   SPM 80   Minutes 15   METs 2.9     Prescription Details   Frequency (times per week) 3   Duration Progress to 45 minutes of aerobic exercise without signs/symptoms of physical distress     Intensity   THRR 40-80% of Max Heartrate 96-134   Ratings of Perceived Exertion 11-15   Perceived Dyspnea 0-4     Resistance Training   Training Prescription Yes   Weight 3 lb   Reps 10-15      Discharge Exercise Prescription (Final Exercise Prescription Changes):     Exercise Prescription Changes - 11/26/16 1200      Response to Exercise   Blood Pressure (Admit) 118/70   Blood Pressure (Exit) 126/64   Heart Rate (Admit) 75 bpm   Heart Rate (Exercise) 117 bpm   Heart Rate (Exit) 80 bpm   Oxygen Saturation (Admit) 98 %   Oxygen Saturation (Exercise) 95 %   Oxygen Saturation (Exit) 94 %   Rating of Perceived Exertion (Exercise) 13   Perceived Dyspnea (Exercise) 1   Symptoms none   Duration Continue with 45 min of aerobic exercise without signs/symptoms of physical distress.   Intensity THRR unchanged     Progression   Progression Continue to progress  workloads to maintain intensity without signs/symptoms of physical distress.      Functional Capacity:     6 Minute Walk    Row Name 08/12/16 1313 08/12/16 1526 11/21/16 1058     6 Minute Walk   Phase Initial Initial Discharge   Distance 1400 feet 1400 feet 1933 feet   Distance % Change  -  - 38 %   Distance Feet Change  -  - 533 ft   Walk Time 6 minutes 6 minutes 6 minutes   # of Rest Breaks 0  - 0   MPH 2.65  - 3.66   METS 3.03  - 4.67   RPE _0 Perceived Dyspnea  2.5 2.5 2   VO2 Peak 10.61  - 16.33   Symptoms No No No   Resting HR 58 bpm 58 bpm 100 bpm   Resting BP 106/64 106/64 134/62   Resting Oxygen Saturation   -  - 97 %   Exercise Oxygen Saturation  during 6 min walk  -  - 96 %   Max Ex. HR 95 bpm 95 bpm 128 bpm   Max Ex. BP 120/58 120/58 166/64     Interval  HR   Baseline HR (retired) 58  -  -   1 Minute HR 64  - 98   2 Minute HR 68  - 114   3 Minute HR 68  - 124   4 Minute HR  -  - 128   5 Minute HR 95  - 127   6 Minute HR 93  - 127   2 Minute Post HR  -  - 91   Interval Heart Rate? Yes  - Yes     Interval Oxygen   Interval Oxygen? Yes  -  -   Baseline Oxygen Saturation % 96 %  - 97 %   Resting Liters of Oxygen 0 L  -  -   1 Minute Oxygen Saturation % 80 %  - 96 %   1 Minute Liters of Oxygen 0 L  - 0 L   2 Minute Oxygen Saturation % 80 %  - 97 %   2 Minute Liters of Oxygen 0 L  - 0 L   3 Minute Oxygen Saturation % 83 %  - 98 %   3 Minute Liters of Oxygen 0 L  - 0 L   4 Minute Oxygen Saturation %  -  - 97 %   4 Minute Liters of Oxygen 0 L  - 0 L   5 Minute Oxygen Saturation % 97 %  - 96 %   5 Minute Liters of Oxygen 0 L  - 0 L   6 Minute Oxygen Saturation % 100 %  - 97 %   6 Minute Liters of Oxygen 0 L  - 0 L   2 Minute Post Oxygen Saturation %  -  - 97 %   2 Minute Post Liters of Oxygen 0 L  - 0 L      Quality of Life:     Quality of Life - 11/19/16 1103      Quality of Life Scores   Health/Function Pre 19.2 %   Health/Function Post  19.09 %   Health/Function % Change -0.57 %   Socioeconomic Pre 26.79 %   Socioeconomic Post 23.56 %   Socioeconomic % Change  -12.06 %   Psych/Spiritual Pre 27.86 %   Psych/Spiritual Post 22.5 %   Psych/Spiritual % Change -19.24 %   Family Pre 30 %   Family Post 28.5 %   Family % Change -5 %   GLOBAL Pre 24.13 %   GLOBAL Post 22.1 %   GLOBAL % Change -8.41 %      Personal Goals: Goals established at orientation with interventions provided to work toward goal.     Personal Goals and Risk Factors at Admission - 08/12/16 1222      Core Components/Risk Factors/Patient Goals on Admission    Weight Management Yes   Intervention Weight Management: Develop a combined nutrition and exercise program designed to reach desired caloric intake, while maintaining appropriate intake of nutrient and fiber, sodium and fats, and appropriate energy expenditure required for the weight goal.   Admit Weight 227 lb 3.2 oz (103.1 kg)   Goal Weight: Short Term 224 lb (101.6 kg)   Goal Weight: Long Term 180 lb (81.6 kg)   Expected Outcomes Short Term: Continue to assess and modify interventions until short term weight is achieved;Long Term: Adherence to nutrition and physical activity/exercise program aimed toward attainment of established weight goal;Weight Loss: Understanding of general recommendations for a balanced deficit meal plan, which promotes 1-2 lb weight loss  per week and includes a negative energy balance of [phone] kcal/d   Improve shortness of breath with ADL's Yes   Intervention Provide education, individualized exercise plan and daily activity instruction to help decrease symptoms of SOB with activities of daily living.   Expected Outcomes Short Term: Achieves a reduction of symptoms when performing activities of daily living.   Develop more efficient breathing techniques such as purse lipped breathing and diaphragmatic breathing; and practicing self-pacing with activity Yes   Intervention  Provide education, demonstration and support about specific breathing techniuqes utilized for more efficient breathing. Include techniques such as pursed lipped breathing, diaphragmatic breathing and self-pacing activity.   Expected Outcomes Short Term: Participant will be able to demonstrate and use breathing techniques as needed throughout daily activities.   Increase knowledge of respiratory medications and ability to use respiratory devices properly  Yes   Intervention Provide education and demonstration as needed of appropriate use of medications, inhalers, and oxygen therapy.   Expected Outcomes Short Term: Achieves understanding of medications use. Understands that oxygen is a medication prescribed by physician. Demonstrates appropriate use of inhaler and oxygen therapy.   Hypertension Yes   Intervention Provide education on lifestyle modifcations including regular physical activity/exercise, weight management, moderate sodium restriction and increased consumption of fresh fruit, vegetables, and low fat dairy, alcohol moderation, and smoking cessation.;Monitor prescription use compliance.   Expected Outcomes Short Term: Continued assessment and intervention until BP is < 140/54m HG in hypertensive participants. < 130/857mHG in hypertensive participants with diabetes, heart failure or chronic kidney disease.;Long Term: Maintenance of blood pressure at goal levels.   Lipids Yes  Unable to tolerate statins   Intervention Provide education and support for participant on nutrition & aerobic/resistive exercise along with prescribed medications to achieve LDL <7061mHDL >30m23m Expected Outcomes Short Term: Participant states understanding of desired cholesterol values and is compliant with medications prescribed. Participant is following exercise prescription and nutrition guidelines.;Long Term: Cholesterol controlled with medications as prescribed, with individualized exercise RX and with  personalized nutrition plan. Value goals: LDL < 70mg52mL > 40 mg.       Personal Goals Discharge:     Goals and Risk Factor Review - 11/19/16 1510      Core Components/Risk Factors/Patient Goals Review   Personal Goals Review Weight Management/Obesity;Improve shortness of breath with ADL's   Review Bill gained some weight throughout the program but has since lost some due to exercising. He is able to do more at home since he started the program.   Expected Outcomes Short: improve SOB with ADL's. Long: Maintain ADL's with exercise routine at home.      Exercise Goals and Review:     Exercise Goals    Row Name 08/12/16 1313             Exercise Goals   Increase Physical Activity Yes       Intervention Provide advice, education, support and counseling about physical activity/exercise needs.;Develop an individualized exercise prescription for aerobic and resistive training based on initial evaluation findings, risk stratification, comorbidities and participant's personal goals.       Expected Outcomes Achievement of increased cardiorespiratory fitness and enhanced flexibility, muscular endurance and strength shown through measurements of functional capacity and personal statement of participant.       Increase Strength and Stamina Yes       Intervention Provide advice, education, support and counseling about physical activity/exercise needs.;Develop an individualized exercise prescription for aerobic and resistive training  based on initial evaluation findings, risk stratification, comorbidities and participant's personal goals.       Expected Outcomes Achievement of increased cardiorespiratory fitness and enhanced flexibility, muscular endurance and strength shown through measurements of functional capacity and personal statement of participant.          Nutrition & Weight - Outcomes:     Pre Biometrics - 08/12/16 1312      Pre Biometrics   Height _0  (1.854 m)   Weight 227  lb 3.2 oz (103.1 kg)   Waist Circumference 46.5 inches   Hip Circumference 45 inches   Waist to Hip Ratio 1.03 %   BMI (Calculated) 30         Post Biometrics - 11/21/16 1057       Post  Biometrics   Height _1  (1.854 m)   Weight 230 lb (104.3 kg)   Waist Circumference 46 inches   Hip Circumference 45.5 inches   Waist to Hip Ratio 1.01 %   BMI (Calculated) 30.35      Nutrition:     Nutrition Therapy & Goals - 08/12/16 1222      Intervention Plan   Intervention Prescribe, educate and counsel regarding individualized specific dietary modifications aiming towards targeted core components such as weight, hypertension, lipid management, diabetes, heart failure and other comorbidities.   Expected Outcomes Short Term Goal: Understand basic principles of dietary content, such as calories, fat, sodium, cholesterol and nutrients.;Short Term Goal: A plan has been developed with personal nutrition goals set during dietitian appointment.;Long Term Goal: Adherence to prescribed nutrition plan.      Nutrition Discharge:   Education Questionnaire Score:     Knowledge Questionnaire Score - 11/19/16 1058      Knowledge Questionnaire Score   Pre Score 9/10   Post Score 9/10  Reviewed with patient      Goals reviewed with patient; copy given to patient.

## 2016-11-28 NOTE — Progress Notes (Signed)
Daily Session Note  Patient Details  Name: Alexander Barton MRN: [id number] Date of Birth: [birth year] Referring Provider:    Encounter Date: 11/28/2016  Check In:     Session Check In - 11/28/16 1007      Check-In   Location [facility]   Staff Present Alexander Barton, BA, ACSM CEP, Exercise Physiologist;Alexander Alcus Dad, RN BSN   Supervising physician immediately available to respond to emergencies [facility] immediately available ER MD   Physician(s) Dr. Alfred Barton and Alexander Barton   Medication changes reported     No   Fall or balance concerns reported    No   Warm-up and Cool-down Performed as group-led instruction   Resistance Training Performed Yes   VAD Patient? No     Pain Assessment   Currently in Pain? No/denies         History  Smoking Status  . Never Smoker  Smokeless Tobacco  . Never Used    Goals Met:  Proper associated with RPD/PD & O2 Sat Independence with exercise equipment Exercise tolerated well No report of cardiac concerns or symptoms Strength training completed today  Goals Unmet:  Not Applicable  Comments:  Alexander Barton graduated today from cardiac rehab with 36 sessions completed.  Details of the patient's exercise prescription and what He needs to do in order to continue the prescription and progress were discussed with patient.  Patient was given a copy of prescription and goals.  Patient verbalized understanding.  Alexander Barton plans to continue to exercise by joining the [employer].   Dr. Emily Barton is Medical Director for [place] and [facility].

## 2016-11-28 NOTE — Progress Notes (Signed)
Pulmonary Individual Treatment Plan  Patient Details  Name: Alexander Barton MRN: [id number] Date of Birth: June 01, 1950 Referring Provider:    Initial Encounter Date:    Pulmonary Rehab from 08/12/2016 in [facility]  Date  08/12/16      Visit Diagnosis: Chronic obstructive pulmonary disease, unspecified COPD type ([place])  Patient's Home Medications on Admission:  Current Outpatient Prescriptions:  .  acidophilus (RISAQUAD) CAPS capsule, Take 2 capsules by mouth daily., Disp: , Rfl:  .  albuterol (PROVENTIL HFA;VENTOLIN HFA) 108 (90 Base) MCG/ACT inhaler, Inhale 2 puffs into the lungs every 4 (four) hours as needed for wheezing or shortness of breath., Disp: 1 Inhaler, Rfl: 3 .  Apoaequorin (PREVAGEN) 10 MG CAPS, Take 10 mg by mouth. Reported on 08/17/2015, Disp: , Rfl:  .  aspirin 81 MG tablet, Take 81 mg by mouth at bedtime. , Disp: , Rfl:  .  budesonide-formoterol (SYMBICORT) 160-4.5 MCG/ACT inhaler, Inhale 2 puffs into the lungs 2 (two) times daily., Disp: 1 Inhaler, Rfl: 12 .  cholecalciferol (VITAMIN D) 1000 units tablet, Take 1,000 Units by mouth daily., Disp: , Rfl:  .  metoprolol succinate (TOPROL-XL) 50 MG 24 hr tablet, TAKE 1 TABLET (50 MG TOTAL) BY MOUTH DAILY. TAKE WITH OR IMMEDIATELY FOLLOWING A MEAL., Disp: 90 tablet, Rfl: 1 .  Multiple Vitamin (MULTIVITAMIN WITH MINERALS) TABS tablet, Take 1 tablet by mouth 2 (two) times daily., Disp: , Rfl:  .  Nutritional Supplements (VITAMIN D PLUS COFACTORS PO), Take 1 capsule by mouth every evening., Disp: , Rfl:  .  omega-3 acid ethyl esters (LOVAZA) 1 g capsule, Take 1 g by mouth every evening., Disp: , Rfl:  .  SPIRULINA PO, Take 3 capsules by mouth daily., Disp: , Rfl:  .  tamsulosin (FLOMAX) 0.4 MG CAPS capsule, Take 2 capsules (0.8 mg total) by mouth daily., Disp: 180 capsule, Rfl: 1 .  Tiotropium Bromide Monohydrate (SPIRIVA RESPIMAT) 2.5 MCG/ACT AERS, Inhale 2.5 mcg into the lungs 2 (two) times daily.,  Disp: 1 Inhaler, Rfl: 12 .  warfarin (COUMADIN) 2 MG tablet, TAKE 1 TABLET WITH 5 MG TABLET DAILY AS DIRECTED BY COUMADIN CLINIC, Disp: 90 tablet, Rfl: 0 .  warfarin (COUMADIN) 5 MG tablet, Take 5-10 mg by mouth at bedtime. 65m on Monday, Wednesday, Friday. Take with 249mtab for a total 55m20mn all other days., Disp: , Rfl:   Past Medical History: Past Medical History:  Diagnosis Date  . Aortic aneurysm, thoracic (HCCStraughn . Aortic valve disease   . Arthritis   . BPH (benign prostatic hyperplasia)   . COPD (chronic obstructive pulmonary disease) (HCCCoates . GERD (gastroesophageal reflux disease)   . HTN (hypertension)   . Hypercholesterolemia   . Stroke ([facility] Ple[facility] . TIA (transient ischemic attack)     Tobacco Use: History  Smoking Status  . Never Smoker  Smokeless Tobacco  . Never Used    Labs: Recent Review Flowsheet Data    Labs for ITP Cardiac and Pulmonary Rehab Latest Ref Rng & Units 03/01/2010 03/02/2010 06/04/2012 11/14/2014 10/03/2015   Cholestrol 0 - 200 mg/dL - 118 ATP III CLASSIFICATION: <200     mg/dL   Desirable 200-239  mg/dL   Borderline High >=240    mg/dL   High 247(H) 254(H) 174   LDLCALC 0 - 99 mg/dL - 67 Total Cholesterol/HDL:CHD Risk Coronary Barton Disease Risk Table Men   Women 1/2 Average Risk   3.4   3.3  Average Risk       5.0   4.4 2 X Average Risk   9.6   7.1 3 X Average Risk  23.4   11.0 Use the calculated Patient Ratio above and the CHD Risk Table to determine the patient's CHD Risk. ATP III CLASSIFICATION (LDL): <100     mg/dL   Optimal 100-129  mg/dL   Near or Above Optimal 130-159  mg/dL   Borderline 160-189  mg/dL   High >190     mg/dL   Very High 169(H) - 87   LDLDIRECT mg/dL - - - 172.0 -   HDL >39.00 mg/dL - 21(L) 45 48.50 47.20   Trlycerides 0.0 - 149.0 mg/dL - 152(H) 165(H) 202.0(H) 195.0(H)   Hemoglobin A1c 4.6 - 6.5 % 6.2 (NOTE)                                                                       According to the ADA Clinical  Practice Recommendations for 2011, when HbA1c is used as a screening test:   >=6.5%   Diagnostic of Diabetes Mellitus           (if abnormal result is confirmed)  5.7-6.4%   Increased risk of developing Diabetes Mellitus  References:Diagnosis and Classification of Diabetes Mellitus,Diabetes PZWC,5852,77(OEUMP 1):S62-S69 and Standards of Medical Care in         Diabetes - 2011,Diabetes Care,2011,34 (Suppl 1):S11-S61.(H) - 5.9(H) - 6.2       Pulmonary Assessment Scores:     Pulmonary Assessment Scores    Row Name 08/12/16 1233 08/12/16 1527 09/29/16 1132     ADL UCSD   ADL Phase Entry  - Mid   SOB Score total 63  - 44   Rest 2  - 0   Walk 3  - 1   Stairs 5  - 5   Bath 3  - 1   Dress 2  - 1   Shop 2  - 1     mMRC Score   mMRC Score  - 1  -   Row Name 11/19/16 1056         ADL UCSD   ADL Phase Exit     SOB Score total 46     Rest 0     Walk 2     Stairs 5     Bath 2     Dress 1     Shop 1        Pulmonary Function Assessment:     Pulmonary Function Assessment - 08/12/16 1238      Initial Spirometry Results   FVC% 62 %   FEV1% 73 %   FEV1/FVC Ratio 74     Post Bronchodilator Spirometry Results   FVC% 71 %   FEV1% 76 %   FEV1/FVC Ratio 81      Exercise Target Goals:    Exercise Program Goal: Individual exercise prescription set with THRR, safety & activity barriers. Participant demonstrates ability to understand and report RPE using BORG scale, to self-measure pulse accurately, and to acknowledge the importance of the exercise prescription.  Exercise Prescription Goal: Starting with aerobic activity 30 plus minutes a day, 3 days per week for initial exercise prescription. Provide home  exercise prescription and guidelines that participant acknowledges understanding prior to discharge.  Activity Barriers & Risk Stratification:   6 Minute Walk:     6 Minute Walk    Row Name 08/12/16 1313 08/12/16 1526 11/21/16 1058     6 Minute Walk   Phase Initial  Initial Discharge   Distance 1400 feet 1400 feet 1933 feet   Distance % Change  -  - 38 %   Distance Feet Change  -  - 533 ft   Walk Time 6 minutes 6 minutes 6 minutes   # of Rest Breaks 0  - 0   MPH 2.65  - 3.66   METS 3.03  - 4.67   RPE '14 14 13   ' Perceived Dyspnea  2.5 2.5 2   VO2 Peak 10.61  - 16.33   Symptoms No No No   Resting HR 58 bpm 58 bpm 100 bpm   Resting BP 106/64 106/64 134/62   Resting Oxygen Saturation   -  - 97 %   Exercise Oxygen Saturation  during 6 min walk  -  - 96 %   Max Ex. HR 95 bpm 95 bpm 128 bpm   Max Ex. BP 120/58 120/58 166/64     Interval HR   Baseline HR (retired) 58  -  -   1 Minute HR 64  - 98   2 Minute HR 68  - 114   3 Minute HR 68  - 124   4 Minute HR  -  - 128   5 Minute HR 95  - 127   6 Minute HR 93  - 127   2 Minute Post HR  -  - 91   Interval Barton Rate? Yes  - Yes     Interval Oxygen   Interval Oxygen? Yes  -  -   Baseline Oxygen Saturation % 96 %  - 97 %   Resting Liters of Oxygen 0 L  -  -   1 Minute Oxygen Saturation % 80 %  - 96 %   1 Minute Liters of Oxygen 0 L  - 0 L   2 Minute Oxygen Saturation % 80 %  - 97 %   2 Minute Liters of Oxygen 0 L  - 0 L   3 Minute Oxygen Saturation % 83 %  - 98 %   3 Minute Liters of Oxygen 0 L  - 0 L   4 Minute Oxygen Saturation %  -  - 97 %   4 Minute Liters of Oxygen 0 L  - 0 L   5 Minute Oxygen Saturation % 97 %  - 96 %   5 Minute Liters of Oxygen 0 L  - 0 L   6 Minute Oxygen Saturation % 100 %  - 97 %   6 Minute Liters of Oxygen 0 L  - 0 L   2 Minute Post Oxygen Saturation %  -  - 97 %   2 Minute Post Liters of Oxygen 0 L  - 0 L     Oxygen Initial Assessment:     Oxygen Initial Assessment - 08/12/16 1213      Home Oxygen   Home Oxygen Device None   Sleep Oxygen Prescription None   Home Exercise Oxygen Prescription None   Home at Rest Exercise Oxygen Prescription None      Oxygen Re-Evaluation:     Oxygen Re-Evaluation    Row Name 08/20/16 1122 09/11/16 0848 09/24/16  1412  11/10/16 1728       Program Oxygen Prescription   Program Oxygen Prescription  - None None None      Home Oxygen   Home Oxygen Device  - None None  -    Sleep Oxygen Prescription  - None None  -    Home Exercise Oxygen Prescription  - None None None    Home at Rest Exercise Oxygen Prescription  - None None None      Goals/Expected Outcomes   Short Term Goals To learn and demonstrate proper purse lipped breathing techniques or other breathing techniques. To learn and demonstrate proper purse lipped breathing techniques or other breathing techniques.;To learn and demonstrate proper use of respiratory medications;To learn and understand importance of monitoring SPO2 with pulse oximeter and demonstrate accurate use of the pulse oximeter.;To Learn and understand importance of maintaining oxygen saturations>88% To learn and understand importance of monitoring SPO2 with pulse oximeter and demonstrate accurate use of the pulse oximeter.;To Learn and understand importance of maintaining oxygen saturations>88%;To learn and demonstrate proper purse lipped breathing techniques or other breathing techniques.;To learn and demonstrate proper use of respiratory medications To learn and demonstrate proper pursed lip breathing techniques or other breathing techniques.;To learn and understand importance of monitoring SPO2 with pulse oximeter and demonstrate accurate use of the pulse oximeter.;To learn and understand importance of maintaining oxygen saturations>88%;To learn and demonstrate proper use of respiratory medications    Long  Term Goals  - Maintenance of O2 saturations>88%;Compliance with respiratory medication;Demonstrates proper use of MDI's;Exhibits proper breathing techniques, such as purse lipped breathing or other method taught during program session;Verbalizes importance of monitoring SPO2 with pulse oximeter and return demonstration Maintenance of O2 saturations>88%;Compliance with respiratory  medication;Demonstrates proper use of MDI's;Exhibits proper breathing techniques, such as purse lipped breathing or other method taught during program session;Verbalizes importance of monitoring SPO2 with pulse oximeter and return demonstration Verbalizes importance of monitoring SPO2 with pulse oximeter and return demonstration;Exhibits proper breathing techniques, such as pursed lip breathing or other method taught during program session;Demonstrates proper use of MDI's;Compliance with respiratory medication;Maintenance of O2 saturations>88%    Comments Reviewed pursed lip breathing with pt today.  Talked about how to use pursed lip breathing to improve breath control and discussed technique. Rush Landmark has been doing better with his breathing.  He is still not where he wants to be.  He is getting better at PLB and finds that it is helpful.  He has been using PLB and trying to avoid using his emergency inhaler.  So far he has been fairly successful.  He did note that sometimes he still needs to sleep in the recliner versus the bed at night.  He has been complaint wiht his medications and using them correctly.  He has plans to get a pulse oximeter as part of a fitbit to monitor his saturations at home.  He is good about maintaining his saturations in rehab. Rush Landmark states that when he is outside and gets short of breath he get anxious. Informed Bill to try to relax and do his PLB when that happens. Rush Landmark has been taking his respiratory medications but has not been using a spacer. Spacer was given to him and he verbalizes understanding of use. Rush Landmark has been using his space with his medications and has been taking them in the right order. He states that since he has been doing that his breathing has improved. Rush Landmark knows how to use his spacer and uses it everyday. Rush Landmark  still has room to improve on his pursed lip breathing.    Goals/Expected Outcomes Short: Become more proficient at using PLB.  Long: Become independent at  using PLB. Short: Continue to work on PLB.  Long: Continue to be compliant with medications. Short: continue to work on PLB and Using respiratory medications properly. Long: Be proficient in PLB and taking respiratory medications properly.  Short: Work on [place] breathing techniques. Long: Be independent with PLB.       Oxygen Discharge (Final Oxygen Re-Evaluation):     Oxygen Re-Evaluation - 11/10/16 1728      Program Oxygen Prescription   Program Oxygen Prescription None     Home Oxygen   Home Exercise Oxygen Prescription None   Home at Rest Exercise Oxygen Prescription None     Goals/Expected Outcomes   Short Term Goals To learn and demonstrate proper pursed lip breathing techniques or other breathing techniques.;To learn and understand importance of monitoring SPO2 with pulse oximeter and demonstrate accurate use of the pulse oximeter.;To learn and understand importance of maintaining oxygen saturations>88%;To learn and demonstrate proper use of respiratory medications   Long  Term Goals Verbalizes importance of monitoring SPO2 with pulse oximeter and return demonstration;Exhibits proper breathing techniques, such as pursed lip breathing or other method taught during program session;Demonstrates proper use of MDI's;Compliance with respiratory medication;Maintenance of O2 saturations>88%   Comments Rush Landmark has been using his space with his medications and has been taking them in the right order. He states that since he has been doing that his breathing has improved. Rush Landmark knows how to use his spacer and uses it everyday. Bill still has room to improve on his pursed lip breathing.   Goals/Expected Outcomes Short: Work on Pursed lip breathing techniques. Long: Be independent with PLB.      Initial Exercise Prescription:     Initial Exercise Prescription - 08/12/16 1300      Date of Initial Exercise RX and Referring Provider   Date 08/12/16     Treadmill   MPH 2.5   Grade 0.5    Minutes 15   METs 2.9     Recumbant Bike   Level 2   RPM 60   Watts 30   Minutes 15   METs 2.9     T5 Nustep   Level 2   SPM 80   Minutes 15   METs 2.9     Prescription Details   Frequency (times per week) 3   Duration Progress to 45 minutes of aerobic exercise without signs/symptoms of physical distress     Intensity   THRR 40-80% of Max Heartrate 96-134   Ratings of Perceived Exertion 11-15   Perceived Dyspnea 0-4     Resistance Training   Training Prescription Yes   Weight 3 lb   Reps 10-15      Perform Capillary Blood Glucose checks as needed.  Exercise Prescription Changes:     Exercise Prescription Changes    Row Name 08/20/16 1100 09/04/16 1500 09/10/16 1400 09/17/16 1400 10/01/16 1400     Response to Exercise   Blood Pressure (Admit) 104/68 118/72  - 126/70 112/58   Blood Pressure (Exercise) 122/58 104/68  -  -  -   Blood Pressure (Exit) 100/60 110/60  - 126/64 100/60   Barton Rate (Admit) 78 bpm 81 bpm  - 85 bpm 81 bpm   Barton Rate (Exercise) 115 bpm 81 bpm  - 124 bpm 124 bpm   Barton Rate (Exit) 73  bpm 65 bpm  - 74 bpm 75 bpm   Oxygen Saturation (Admit) 97 % 98 %  - 98 % 96 %   Oxygen Saturation (Exercise) 96 % 96 %  - 95 % 96 %   Oxygen Saturation (Exit) 97 % 97 %  - 97 % 93 %   Rating of Perceived Exertion (Exercise) 12 12  - 12 13   Perceived Dyspnea (Exercise) 1 1  - 1 1   Symptoms none none  - none knee pain   Comments first full day of exercise  -  -  - checking on PT   Duration Progress to 45 minutes of aerobic exercise without signs/symptoms of physical distress Continue with 45 min of aerobic exercise without signs/symptoms of physical distress.  - Continue with 45 min of aerobic exercise without signs/symptoms of physical distress. Continue with 45 min of aerobic exercise without signs/symptoms of physical distress.   Intensity THRR unchanged THRR unchanged  - THRR unchanged THRR unchanged     Progression   Progression Continue to progress  workloads to maintain intensity without signs/symptoms of physical distress. Continue to progress workloads to maintain intensity without signs/symptoms of physical distress.  - Continue to progress workloads to maintain intensity without signs/symptoms of physical distress. Continue to progress workloads to maintain intensity without signs/symptoms of physical distress.   Average METs 2.5 2.5  - 3 3.5     Resistance Training   Training Prescription Yes Yes  - Yes Yes   Weight body weight  late arrival 4  - 4 4 lb   Reps 10-15 10-15  - 10-15 10-15     Interval Training   Interval Training No No  - No  -     Treadmill   MPH 2.5  -  - 3 3.2   Grade 0.5  -  - 0.5 0.5   Minutes 15  -  - 15 15   METs 2.9  -  - 3.5 3.67     Recumbant Bike   Level  - 4  -  - 6   Watts  - 27  -  - 61   Minutes  - 15  -  - 15   METs  - 2.75  -  - 3.8     Arm Ergometer   Level  - 2  -  -  -   Minutes  - 15  -  -  -   METs  - 2.5  -  -  -     T5 Nustep   Level 2 4  - 4 6   SPM 75 83  - 85 79   Minutes 15 15  - 15 15   METs 2.1 2.1  - 2.5 3     Home Exercise Plan   Plans to continue exercise at  -  - Home (comment)  walking, also has ellipitcal  -  -   Frequency  -  - Add 2 additional days to program exercise sessions.  -  -   Initial Home Exercises Provided  -  - 09/10/16  -  -   [place] Name 10/15/16 1400 10/29/16 1500 11/12/16 1400 11/26/16 1200       Response to Exercise   Blood Pressure (Admit) 120/62 130/80 124/64 118/70    Blood Pressure (Exit) 106/64 102/58 122/60 126/64    Barton Rate (Admit) 85 bpm 70 bpm 70 bpm 75 bpm    Barton Rate (Exercise) 85 bpm  127 bpm 106 bpm 117 bpm    Barton Rate (Exit) 86 bpm 75 bpm 75 bpm 80 bpm    Oxygen Saturation (Admit) 96 % 98 % 96 % 98 %    Oxygen Saturation (Exercise) 91 % 95 % 96 % 95 %    Oxygen Saturation (Exit) 96 % 97 % 98 % 94 %    Rating of Perceived Exertion (Exercise) '13 13 15 13    ' Perceived Dyspnea (Exercise) '1 1 5 1    ' Symptoms none none  none none    Duration Continue with 45 min of aerobic exercise without signs/symptoms of physical distress. Continue with 45 min of aerobic exercise without signs/symptoms of physical distress. Continue with 45 min of aerobic exercise without signs/symptoms of physical distress. Continue with 45 min of aerobic exercise without signs/symptoms of physical distress.    Intensity THRR unchanged THRR unchanged THRR unchanged THRR unchanged      Progression   Progression Continue to progress workloads to maintain intensity without signs/symptoms of physical distress. Continue to progress workloads to maintain intensity without signs/symptoms of physical distress. Continue to progress workloads to maintain intensity without signs/symptoms of physical distress. Continue to progress workloads to maintain intensity without signs/symptoms of physical distress.    Average METs 3.54 3.7 3.8  -      Resistance Training   Training Prescription Yes  - Yes  -    Weight 5 lb.  - 5 lb  -    Reps 10-15  - 10-15  -      Interval Training   Interval Training No  -  -  -      Treadmill   MPH 3.3 3.3  -  -    Grade 1 1  -  -    Minutes 15 15  -  -    METs 3.98 3.98  -  -      Recumbant Bike   Level  - 7  -  -    Watts  - 61  -  -    Minutes  - 15  -  -    METs  - 3.8  -  -      T5 Nustep   Level 6 7  -  -    SPM 77 78  -  -    Minutes 15 15  -  -    METs 3.1 3.3  -  -       Exercise Comments:     Exercise Comments    Row Name 08/20/16 1030           Exercise Comments First full day of exercise!  Patient was oriented to gym and equipment including functions, settings, policies, and procedures.  Patient's individual exercise prescription and treatment plan were reviewed.  All starting workloads were established based on the results of the 6 minute walk test done at initial orientation visit.  The plan for exercise progression was also introduced and progression will be customized based on  patient's performance and goals.          Exercise Goals and Review:     Exercise Goals    Row Name 08/12/16 1313             Exercise Goals   Increase Physical Activity Yes       Intervention Provide advice, education, support and counseling about physical activity/exercise needs.;Develop an individualized exercise prescription for aerobic and resistive training based on  initial evaluation findings, risk stratification, comorbidities and participant's personal goals.       Expected Outcomes Achievement of increased cardiorespiratory fitness and enhanced flexibility, muscular endurance and strength shown through measurements of functional capacity and personal statement of participant.       Increase Strength and Stamina Yes       Intervention Provide advice, education, support and counseling about physical activity/exercise needs.;Develop an individualized exercise prescription for aerobic and resistive training based on initial evaluation findings, risk stratification, comorbidities and participant's personal goals.       Expected Outcomes Achievement of increased cardiorespiratory fitness and enhanced flexibility, muscular endurance and strength shown through measurements of functional capacity and personal statement of participant.          Exercise Goals Re-Evaluation :     Exercise Goals Re-Evaluation    Row Name 09/04/16 1523 09/10/16 1427 09/17/16 1451 10/01/16 1421 10/15/16 1458     Exercise Goal Re-Evaluation   Exercise Goals Review Increase Physical Activity;Increase Strenth and Stamina Increase Physical Activity;Increase Strenth and Stamina Increase Physical Activity;Increase Strenth and Stamina Increase Physical Activity;Increase Strength and Stamina;Understanding of Exercise Prescription Increase Physical Activity;Increase Strength and Stamina   Comments Rush Landmark has had a successful first week of exercise. Reviewed home exercise with pt today.  Pt plans to continue walking  at home for exercise.  We talked about adding a minute at least each week in addition to adding in warm up and cool down to his current routine.  Reviewed THR, pulse, RPE, sign and symptoms, and when to call 911 or MD.  Also discussed weather considerations and indoor options.  Pt voiced understanding. Rush Landmark has increased his overall MET level and is progressing well with exercise.   Rush Landmark stated he has had knee pain for a while and has spoken to his Dr.  He is going to schedule a PT evaluation. Rush Landmark is progressing well with exercise.  He has increased overall MET level and is up to 5 lb strength training.   Expected Outcomes Short - Rush Landmark will continue to attend regularly.  Long - Rush Landmark will develop regular exercise as a habit. Short: Start increasing time of exercise outside of rehab to work up 61mn.  Long: Regular exercise for 316m a day. Short - BiRush Landmarkill continue to progress during LW.  Long - BiRush Landmarkill exercise independently after completing LW.  Short - BiRush Landmarkill begin PT for his knee.  Long - BiRush Landmarkill be able to exercise without knee pain. Short - BiRush Landmarkil continue to progress and see improved fitness. Long - BiRush Landmarkill maintain exercise when he finishes LW.   [place] 10/29/16 1538 11/12/16 1418           Exercise Goal Re-Evaluation   Exercise Goals Review Increase Physical Activity;Increase Strength and Stamina;Able to understand and use rate of perceived exertion (RPE) scale Increase Physical Activity;Increase Strength and Stamina;Able to understand and use rate of perceived exertion (RPE) scale      Comments Bill continues to increase levels on RB and RPE is 11 so staff will have him try the elliptical next time.   BiRush Landmarkas progressed well with exercise.  He has recently added the elliptical instead of RB.  he has increasd weight strength training to 6 lb.      Expected Outcomes Short - BiRush Landmarkill add elliptical instead of RB.  Long - BiRush Landmarkill continue to improve his overall fitness. Short - BiRush Landmarkwill continue with current program and finish  LW.  Long - Bill will continue to exercise and maintain fitness on his own.         Discharge Exercise Prescription (Final Exercise Prescription Changes):     Exercise Prescription Changes - 11/26/16 1200      Response to Exercise   Blood Pressure (Admit) 118/70   Blood Pressure (Exit) 126/64   Barton Rate (Admit) 75 bpm   Barton Rate (Exercise) 117 bpm   Barton Rate (Exit) 80 bpm   Oxygen Saturation (Admit) 98 %   Oxygen Saturation (Exercise) 95 %   Oxygen Saturation (Exit) 94 %   Rating of Perceived Exertion (Exercise) 13   Perceived Dyspnea (Exercise) 1   Symptoms none   Duration Continue with 45 min of aerobic exercise without signs/symptoms of physical distress.   Intensity THRR unchanged     Progression   Progression Continue to progress workloads to maintain intensity without signs/symptoms of physical distress.      Nutrition:  Target Goals: Understanding of nutrition guidelines, daily intake of sodium <1552m, cholesterol <2062m calories 30% from fat and 7% or less from saturated fats, daily to have 5 or more servings of fruits and vegetables.  Biometrics:     Pre Biometrics - 08/12/16 1312      Pre Biometrics   Height '6\' 1"'  (1.854 m)   Weight 227 lb 3.2 oz (103.1 kg)   Waist Circumference 46.5 inches   Hip Circumference 45 inches   Waist to Hip Ratio 1.03 %   BMI (Calculated) 30         Post Biometrics - 11/21/16 1057       Post  Biometrics   Height '6\' 1"'  (1.854 m)   Weight 230 lb (104.3 kg)   Waist Circumference 46 inches   Hip Circumference 45.5 inches   Waist to Hip Ratio 1.01 %   BMI (Calculated) 30.35      Nutrition Therapy Plan and Nutrition Goals:     Nutrition Therapy & Goals - 08/12/16 1222      Intervention Plan   Intervention Prescribe, educate and counsel regarding individualized specific dietary modifications aiming towards targeted core components such as weight, hypertension, lipid  management, diabetes, Barton failure and other comorbidities.   Expected Outcomes Short Term Goal: Understand basic principles of dietary content, such as calories, fat, sodium, cholesterol and nutrients.;Short Term Goal: A plan has been developed with personal nutrition goals set during dietitian appointment.;Long Term Goal: Adherence to prescribed nutrition plan.      Nutrition Discharge: Rate Your Plate Scores:   Nutrition Goals Re-Evaluation:     Nutrition Goals Re-Evaluation    Row Name 09/11/16 0844 10/10/16 1247 11/19/16 1505         Goals   Current Weight  -  - 229 lb 8 oz (104.1 kg)     Nutrition Goal Meet with dietician to work on weight loss diet Eat more fruits and vegetables. Lose weight by eating a healthy diet     Comment Appointment scheduled for 8/24 at [place] He plans to take his wife with him BiRush [place] with the RD and is working towards 5 servings fruit and veggies per day.  His weight has maintained and he would like to lose. BiRush Landmarkas been eating better since his visit with the DiPetersonHe was drinking 48 ounces of water before class and has cut back a little considering the amount he was drinking.     Expected Outcome Short: Continue to work on [employer]  Long: Meet wiht dietician Short - Rush Landmark will continue his healthier dietary changes.  Long - Rush Landmark will acheive weight loss goals. Short: lose weight from dietary changes. Long: maintain a healthy diet to reach weight goal.        Nutrition Goals Discharge (Final Nutrition Goals Re-Evaluation):     Nutrition Goals Re-Evaluation - 11/19/16 1505      Goals   Current Weight 229 lb 8 oz (104.1 kg)   Nutrition Goal Lose weight by eating a healthy diet   Comment Rush Landmark has been eating better since his visit with the Dietician. He was drinking 48 ounces of water before class and has cut back a little considering the amount he was drinking.   Expected Outcome Short: lose weight from dietary changes. Long: maintain a  healthy diet to reach weight goal.      Psychosocial: Target Goals: Acknowledge presence or absence of significant depression and/or stress, maximize coping skills, provide positive support system. Participant is able to verbalize types and ability to use techniques and skills needed for reducing stress and depression.   Initial Review & Psychosocial Screening:     Initial Psych Review & Screening - 08/12/16 1225      Initial Review   Current issues with History of Depression;Current Sleep Concerns  Over 20 years ago with depression history.  Sleep up several times a night, sometimes hard to get back to sleep.  Will use over the counter aid about 1-2 times a month to get back to sleep.      Family Dynamics   Good Support System? Yes  WOnderful wife of almost 16 years, children     Barriers   Psychosocial barriers to participate in program There are no identifiable barriers or psychosocial needs.;The patient should benefit from training in stress management and relaxation.     Screening Interventions   Interventions Encouraged to exercise;To provide support and resources with identified psychosocial needs      Quality of Life Scores:     Quality of Life - 11/19/16 1103      Quality of Life Scores   Health/Function Pre 19.2 %   Health/Function Post 19.09 %   Health/Function % Change -0.57 %   Socioeconomic Pre 26.79 %   Socioeconomic Post 23.56 %   Socioeconomic % Change  -12.06 %   Psych/Spiritual Pre 27.86 %   Psych/Spiritual Post 22.5 %   Psych/Spiritual % Change -19.24 %   Family Pre 30 %   Family Post 28.5 %   Family % Change -5 %   GLOBAL Pre 24.13 %   GLOBAL Post 22.1 %   GLOBAL % Change -8.41 %      PHQ-9: Recent Review Flowsheet Data    Depression screen [facility] 2/9 11/19/2016 11/05/2016 08/12/2016 12/05/2015   Decreased Interest 0 1 0 0   Down, Depressed, Hopeless 0 0 0 0   PHQ - 2 Score 0 1 0 0   Altered sleeping 1 2 0 -   Tired, decreased energy '2 3 2 ' -    Change in appetite 1 0 0 -   Feeling bad or failure about yourself  0 0 0 -   Trouble concentrating 1 2 0 -   Moving slowly or fidgety/restless 0 0 0 -   Suicidal thoughts 0 0 0 -   PHQ-9 Score '5 8 2 ' -   Difficult doing work/chores Not difficult at all Somewhat difficult Not difficult at all -     Interpretation  of Total Score  Total Score Depression Severity:  1-4 = Minimal depression, 5-9 = Mild depression, 10-14 = Moderate depression, 15-19 = Moderately severe depression, 20-27 = Severe depression   Psychosocial Evaluation and Intervention:     Psychosocial Evaluation - 09/01/16 1132      Psychosocial Evaluation & Interventions   Interventions Encouraged to exercise with the program and follow exercise prescription;Relaxation education   Comments Counselor met with Mr. Jarchow [facility]) today for initial psychosocial evaluation.  He is a 66 year old who was diagnosed with COPD in 2004; but reports his symptoms have recently been much worse.  Rush Landmark has a strong support system with a spouse of 27 years, a son who lives locally and a daughter who recently moved to [place].  Rush Landmark is also actively involved in his local church.  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  Bill reports sleeping "okay" with some occasional use of OTC sleep aid PRN.  He has a good appetite.  Rush Landmark reports a history of depression in 1997; subsequent to a job loss; but has had no other symptoms since or currently.  He reports his mood is generally positive most of the time and he has minimal stres other than his health issues at this time.  Rush Landmark has goals to exercise consistently and improve his breathing.  He also wants to increase his cardio and pulmonary functioning overall.  STaff will follow with Bill throughout the course of this program.     Expected Outcomes Rush Landmark will benefit from consistent exercise  to achieve his stated goals.  He will also benefit from all of the educational aspects to learn to manage his medical issues better.   Continue Psychosocial Services  Follow up required by staff      Psychosocial Re-Evaluation:     Psychosocial Re-Evaluation    [place] Name 09/11/16 0845 10/08/16 1140 11/05/16 1115         Psychosocial Re-Evaluation   Current issues with Current Sleep Concerns  -  -     Comments Rush Landmark has no major stressors at htis time.  He continues to watch his diet and health given his history.  He does not sleep well, but does try to take a supplement to help 2-3x a week.  He has been taking his time getting up in the middle of the night so he doesnt pass out on his way to the bathroom.  He has enjoyed class and has found the education classes to very beneficial. Counselor follow up with Rush Landmark today reporting progress noted since coming into this program by more stamina and strength in his ability to do his normal activities like vacuuming and washing the cars.  He reports the educational pieces have been extremely helpful with learning to pace himself and using the pursed lips for breathing.  He wishes he was able to sleep more than 5-6 hours/night but notes this is an improvement.  His appetite is good and he is learning to eat smaller meals more frequently vs. larger meals and this is more satisfying for him.  Rush Landmark remains in a positive mood and has minimal stress in his life currently.  Counselor commended him on the progress noted and his commitment to consistent exercise and positive self care.   Counselor follow with Rush Landmark today stating he is increasing in intensity on the equipment  and proud of his progress.  However, he continues to have little energy and reports feeling more tired and gaining weight since coming into the program.  His sleep is still a problem but has improved.  Counselor had Bill complete a new PHQ-9 to compare and his scores have increased to an "8" vs a "2"  indicating more mild depression may be present.  Counselor discussed this with Rush Landmark stating he had expected greater progress by now and is a little disappointed in his energy level and his weight gain.  Counselor encouraged Bill to speak with his Dr. about these concerns.  Counselor also [employer] on his ability to increase his intensity levels in class.  He will continue to be followed by staff.       Expected Outcomes Short: Continue to attend classes to learn more.  Long: Continue to look out for health and maintain positive attitude. Rush Landmark will continue to exercise consistently to maintain the progress he has made.  He also will continue to practice positive self care overall. Rush Landmark will continue to exercise consistently to maintain his progress and achieve his goals.  He will speak with his Dr. about his decreased energy levels and his weight gain since coming into this program.       Interventions Stress management education;Encouraged to attend Pulmonary Rehabilitation for the exercise;Relaxation education  -  -     Continue Psychosocial Services  Follow up required by staff  - Follow up required by staff        Psychosocial Discharge (Final Psychosocial Re-Evaluation):     Psychosocial Re-Evaluation - 11/05/16 1115      Psychosocial Re-Evaluation   Comments Counselor follow with Rush Landmark today stating he is increasing in intensity on the equipment and proud of his progress.  However, he continues to have little energy and reports feeling more tired and gaining weight since coming into the program.  His sleep is still a problem but has improved.  Counselor had Bill complete a new PHQ-9 to compare and his scores have increased to an "8" vs a "2" indicating more mild depression may be present.  Counselor discussed this with Rush Landmark stating he had expected greater progress by now and is a little disappointed in his energy level and his weight gain.  Counselor encouraged Bill to speak with his Dr. about  these concerns.  Counselor also [employer] on his ability to increase his intensity levels in class.  He will continue to be followed by staff.     Expected Outcomes Bill will continue to exercise consistently to maintain his progress and achieve his goals.  He will speak with his Dr. about his decreased energy levels and his weight gain since coming into this program.     Continue Psychosocial Services  Follow up required by staff      Education: Education Goals: Education classes will be provided on a weekly basis, covering required topics. Participant will state understanding/return demonstration of topics presented.  Learning Barriers/Preferences:     Learning Barriers/Preferences - 08/12/16 1232      Learning Barriers/Preferences   Learning Barriers None   Learning Preferences None      Education Topics: Initial Evaluation Education: - Verbal, written and demonstration of respiratory meds, RPE/PD scales, oximetry and breathing techniques. Instruction on use of nebulizers and MDIs: cleaning and proper use, rinsing mouth with steroid doses and importance of monitoring MDI activations.   Pulmonary Rehab from 11/24/2016 in [facility] Cardiac and Pulmonary Rehab  Date  08/12/16  Educator  SB  Instruction Review Code (retired)  2- meets goals/outcomes      General Nutrition Guidelines/Fats and Fiber: -Group instruction provided by verbal, written material, models and posters to present the general guidelines for Barton healthy nutrition. Gives an explanation and review of dietary fats and fiber.   Pulmonary Rehab from 11/24/2016 in [facility] Cardiac and Pulmonary Rehab  Date  11/17/16  Educator  CR  Instruction Review Code  1- Verbalizes Understanding      Controlling Sodium/Reading Food Labels: -Group verbal and written material supporting the discussion of sodium use in Barton healthy nutrition. Review and explanation with models, verbal and written materials for utilization of the  food label.   Pulmonary Rehab from 11/24/2016 in [facility] Cardiac and Pulmonary Rehab  Date  11/24/16  Educator  CR  Instruction Review Code  5- Refused Teaching      Exercise Physiology & Risk Factors: - Group verbal and written instruction with models to review the exercise physiology of the cardiovascular system and associated critical values. Details cardiovascular disease risk factors and the goals associated with each risk factor.   Pulmonary Rehab from 11/24/2016 in [facility] Cardiac and Pulmonary Rehab  Date  11/07/16  Educator  Nada Maclachlan, EP  Instruction Review Code  2- Demonstrated Understanding      Aerobic Exercise & Resistance Training: - Gives group verbal and written discussion on the health impact of inactivity. On the components of aerobic and resistive training programs and the benefits of this training and how to safely progress through these programs.   Pulmonary Rehab from 11/24/2016 in [facility] Cardiac and Pulmonary Rehab  Date  08/29/16  Educator  [facility]  Instruction Review Code  2- Demonstrated Understanding      Flexibility, Balance, General Exercise Guidelines: - Provides group verbal and written instruction on the benefits of flexibility and balance training programs. Provides general exercise guidelines with specific guidelines to those with Barton or lung disease. Demonstration and skill practice provided.   Pulmonary Rehab from 11/24/2016 in [facility] Cardiac and Pulmonary Rehab  Date  09/17/16  Educator  [facility]  Instruction Review Code  2- Demonstrated Understanding      Stress Management: - Provides group verbal and written instruction about the health risks of elevated stress, cause of high stress, and healthy ways to reduce stress.   Pulmonary Rehab from 11/24/2016 in [facility] Cardiac and Pulmonary Rehab  Date  10/22/16  Educator  [facility]  Instruction Review Code  1- Verbalizes Understanding      Depression: - Provides group verbal and written instruction on the  correlation between Barton/lung disease and depressed mood, treatment options, and the stigmas associated with seeking treatment.   Pulmonary Rehab from 11/24/2016 in [facility] Cardiac and Pulmonary Rehab  Date  09/24/16  Educator  [facility]  Instruction Review Code (retired)  2- meets goals/outcomes      Exercise & Equipment Safety: - Individual verbal instruction and demonstration of equipment use and safety with use of the equipment.   Pulmonary Rehab from 11/24/2016 in [facility] Cardiac and Pulmonary Rehab  Date  08/12/16  Educator  SB  Instruction Review Code  1- Verbalizes Understanding      Infection Prevention: - Provides verbal and written material to individual with discussion of infection control including proper hand washing and proper equipment cleaning during exercise session.   Pulmonary Rehab from 11/24/2016 in [facility] Cardiac and Pulmonary Rehab  Date  08/12/16  Educator  Sb  Instruction Review Code  1- Verbalizes Understanding      Falls Prevention: - Provides verbal and written material to individual with discussion of falls prevention and safety.   Pulmonary Rehab from 11/24/2016 in [facility] Cardiac and Pulmonary Rehab  Date  08/12/16  Educator  SB  Instruction Review Code (retired)  2- meets goals/outcomes      Diabetes: - Individual verbal and written instruction to review signs/symptoms of diabetes, desired ranges of glucose level fasting, after meals and with exercise. Advice that pre and post exercise glucose checks will be done for 3 sessions at entry of program.   Chronic Lung Diseases: - Group verbal and written instruction to review new updates, new respiratory medications, new advancements in procedures and treatments. Provide informative websites and "800" numbers of self-education.   Pulmonary Rehab from 11/24/2016 in [facility] Cardiac and Pulmonary Rehab  Date  11/05/16  Educator  [facility]  Instruction Review Code  1- Verbalizes Understanding      Lung Procedures: -  Group verbal and written instruction to describe testing methods done to diagnose lung disease. Review the outcome of test results. Describe the treatment choices: Pulmonary Function Tests, ABGs and oximetry.   Energy Conservation: - Provide group verbal and written instruction for methods to conserve energy, plan and organize activities. Instruct on pacing techniques, use of adaptive equipment and posture/positioning to relieve shortness of breath.   Pulmonary Rehab from 11/24/2016 in [facility] Cardiac and Pulmonary Rehab  Date  10/15/16  Educator  [facility]  Instruction Review Code  1- Verbalizes Understanding      Triggers: - Group verbal and written instruction to review types of environmental controls: home humidity, furnaces, filters, dust mite/pet prevention, HEPA vacuums. To discuss weather changes, air quality and the benefits of nasal washing.   Exacerbations: - Group verbal and written instruction to provide: warning signs, infection symptoms, calling MD promptly, preventive modes, and value of vaccinations. Review: effective airway clearance, coughing and/or vibration techniques. Create an Sports administrator.   Oxygen: - Individual and group verbal and written instruction on oxygen therapy. Includes supplement oxygen, available portable oxygen systems, continuous and intermittent flow rates, oxygen safety, concentrators, and Medicare reimbursement for oxygen.   Respiratory Medications: - Group verbal and written instruction to review medications for lung disease. Drug class, frequency, complications, importance of spacers, rinsing mouth after steroid MDI's, and proper cleaning methods for nebulizers.   AED/CPR: - Group verbal and written instruction with the use of models to demonstrate the basic use of the AED with the basic ABC's of resuscitation.   Breathing Retraining: - Provides individuals verbal and written instruction on purpose, frequency, and proper technique of diaphragmatic  breathing and pursed-lipped breathing. Applies individual practice skills.   Anatomy and Physiology of the Lungs: - Group verbal and written instruction with the use of models to provide basic lung anatomy and physiology related to function, structure and complications of lung disease.   Pulmonary Rehab from 11/24/2016 in [facility] Cardiac and Pulmonary Rehab  Date  10/08/16  Educator  [facility]  Instruction Review Code  1- Verbalizes Understanding      Anatomy & Physiology of the Barton: - Group verbal and written instruction and models provide basic cardiac anatomy and physiology, with the coronary electrical and arterial systems. Review of: AMI, Angina, Valve disease, Barton Failure, Cardiac Arrhythmia, Pacemakers, and the ICD.   Barton Failure: - Group verbal and written instruction on the basics of Barton failure: signs/symptoms, treatments, explanation of ejection fraction, enlarged Barton and cardiomyopathy.   Sleep Apnea: -  Individual verbal and written instruction to review Obstructive Sleep Apnea. Review of risk factors, methods for diagnosing and types of masks and machines for OSA.   Anxiety: - Provides group, verbal and written instruction on the correlation between Barton/lung disease and anxiety, treatment options, and management of anxiety.   Relaxation: - Provides group, verbal and written instruction about the benefits of relaxation for patients with Barton/lung disease. Also provides patients with examples of relaxation techniques.   Pulmonary Rehab from 11/24/2016 in [facility] Cardiac and Pulmonary Rehab  Date  11/19/16  Educator  [facility]  Instruction Review Code  1- Verbalizes Understanding      Cardiac Medications: - Group verbal and written instruction to review commonly prescribed medications for Barton disease. Reviews the medication, class of the drug, and side effects.   Pulmonary Rehab from 11/24/2016 in [facility] Cardiac and Pulmonary Rehab  Date  10/10/16  Educator  C. Grover   Instruction Review Code  1- Verbalizes Understanding      Know Your Numbers: -Group verbal and written instruction about important numbers in your health.  Review of Cholesterol, Blood Pressure, Diabetes, and BMI and the role they play in your overall health.   Other: -Provides group and verbal instruction on various topics (see comments)    Knowledge Questionnaire Score:     Knowledge Questionnaire Score - 11/19/16 1058      Knowledge Questionnaire Score   Pre Score 9/10   Post Score 9/10  Reviewed with patient       Core Components/Risk Factors/Patient Goals at Admission:     Personal Goals and Risk Factors at Admission - 08/12/16 1222      Core Components/Risk Factors/Patient Goals on Admission    Weight Management Yes   Intervention Weight Management: Develop a combined nutrition and exercise program designed to reach desired caloric intake, while maintaining appropriate intake of nutrient and fiber, sodium and fats, and appropriate energy expenditure required for the weight goal.   Admit Weight 227 lb 3.2 oz (103.1 kg)   Goal Weight: Short Term 224 lb (101.6 kg)   Goal Weight: Long Term 180 lb (81.6 kg)   Expected Outcomes Short Term: Continue to assess and modify interventions until short term weight is achieved;Long Term: Adherence to nutrition and physical activity/exercise program aimed toward attainment of established weight goal;Weight Loss: Understanding of general recommendations for a balanced deficit meal plan, which promotes 1-2 lb weight loss per week and includes a negative energy balance of [id number] kcal/d   Improve shortness of breath with ADL's Yes   Intervention Provide education, individualized exercise plan and daily activity instruction to help decrease symptoms of SOB with activities of daily living.   Expected Outcomes Short Term: Achieves a reduction of symptoms when performing activities of daily living.   Develop more efficient breathing  techniques such as purse lipped breathing and diaphragmatic breathing; and practicing self-pacing with activity Yes   Intervention Provide education, demonstration and support about specific breathing techniuqes utilized for more efficient breathing. Include techniques such as pursed lipped breathing, diaphragmatic breathing and self-pacing activity.   Expected Outcomes Short Term: Participant will be able to demonstrate and use breathing techniques as needed throughout daily activities.   Increase knowledge of respiratory medications and ability to use respiratory devices properly  Yes   Intervention Provide education and demonstration as needed of appropriate use of medications, inhalers, and oxygen therapy.   Expected Outcomes Short Term: Achieves understanding of medications use. Understands that oxygen is a medication prescribed by  physician. Demonstrates appropriate use of inhaler and oxygen therapy.   Hypertension Yes   Intervention Provide education on lifestyle modifcations including regular physical activity/exercise, weight management, moderate sodium restriction and increased consumption of fresh fruit, vegetables, and low fat dairy, alcohol moderation, and smoking cessation.;Monitor prescription use compliance.   Expected Outcomes Short Term: Continued assessment and intervention until BP is < 140/50m HG in hypertensive participants. < 130/873mHG in hypertensive participants with diabetes, Barton failure or chronic kidney disease.;Long Term: Maintenance of blood pressure at goal levels.   Lipids Yes  Unable to tolerate statins   Intervention Provide education and support for participant on nutrition & aerobic/resistive exercise along with prescribed medications to achieve LDL <7036mHDL >19m30m Expected Outcomes Short Term: Participant states understanding of desired cholesterol values and is compliant with medications prescribed. Participant is following exercise prescription and  nutrition guidelines.;Long Term: Cholesterol controlled with medications as prescribed, with individualized exercise RX and with personalized nutrition plan. Value goals: LDL < 70mg89mL > 40 mg.      Core Components/Risk Factors/Patient Goals Review:      Goals and Risk Factor Review    Row Name 09/10/16 1431 10/10/16 1249 11/19/16 1510         Core Components/Risk Factors/Patient Goals Review   Personal Goals Review Weight Management/Obesity;Hypertension;Lipids Weight Management/Obesity;Improve shortness of breath with ADL's Weight Management/Obesity;Improve shortness of breath with ADL's     Review Bill's weight has been holding steady around 230lbs.  He does fluctuate up and down some.  His blood pressures have been doing well and he is checking them at home.  He has plans to purchase a Fitbit like device that will monitor his Barton rate, blood pressure, and oxygen satuarations.  He has not had his lipids checked recently.  He is using nutritional supplements to help improve his cholesterol numbers.  Bill Rush Landmarknot lost weight but is maintaining.  He states he can do ADLS now that he could not before such as vacuuming, laundry, washing cars,etc.   Bill Rush Landmarked some weight throughout the program but has since lost some due to exercising. He is able to do more at home since he started the program.     Expected Outcomes Short: Continue to work on weight loss.  Long: Continue to work on risk factor modificiations. Short - Bill Rush Landmark continue to attend LW and build stamina.  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 Short: improve SOB with ADL's. Long: Maintain ADL's with exercise routine at home.        Core Components/Risk Factors/Patient Goals at Discharge (Final Review):      Goals and Risk Factor Review - 11/19/16 1510      Core Components/Risk Factors/Patient Goals Review   Personal Goals Review Weight Management/Obesity;Improve shortness of  breath with ADL's   Review Bill gained some weight throughout the program but has since lost some due to exercising. He is able to do more at home since he started the program.   Expected Outcomes Short: improve SOB with ADL's. Long: Maintain ADL's with exercise routine at home.      ITP Comments:     ITP Comments    Row Name 08/12/16 1213 08/22/16 1029 09/01/16 0900 09/26/16 1025 09/29/16 0827   ITP Comments Medical review completed today with Initial ITP created.  Disgnosis documnetation can be found [facility] 07/22/2016 Patient attened Education "Know Your Numbers" 30 day review completed  ITP sent to Dr. Ramonita Lab for Dr. Emily Filbert Director of [place]. Continue with ITP unless changes are made by physician.  Gwyndolyn Saxon met with the Pulmonary Rehab Registered Dietician today.  30 day review completed. ITP sent to Dr. Emily Filbert Director of [place]. Continue with ITP unless changes are made by physician.     [place] Name 10/27/16 0830 11/24/16 0822 11/28/16 1009       ITP Comments 30 day review completed. ITP sent to Dr. Emily Filbert Director of [place]. Continue with ITP unless changes are made by physician.   30 day review completed. ITP sent to Dr. Emily Filbert Director of [place]. Continue with ITP unless changes are made by physician.   Discharge ITP sent and signed by Dr. Sabra Heck.  Discharge Summary routed to PCP and Pulmonologist.        Comments: Discharge ITP

## 2016-11-30 NOTE — Telephone Encounter (Signed)
Please fill in the form with his medical history. Please also contact him to see how his breathing is doing and make sure he is not having any chest pain with exertion. Please see how he has been doing with pulmonary rehabilitation as well. Thanks.

## 2016-12-01 ENCOUNTER — Encounter: Payer: Self-pay | Admitting: Family Medicine

## 2016-12-01 ENCOUNTER — Ambulatory Visit (INDEPENDENT_AMBULATORY_CARE_PROVIDER_SITE_OTHER): Payer: Medicare Other | Admitting: Family Medicine

## 2016-12-01 VITALS — BP 100/62 | HR 66 | Temp 98.0°F | Wt 232.0 lb

## 2016-12-01 DIAGNOSIS — Z8673 Personal history of transient ischemic attack (TIA), and cerebral infarction without residual deficits: Secondary | ICD-10-CM | POA: Diagnosis not present

## 2016-12-01 DIAGNOSIS — M170 Bilateral primary osteoarthritis of knee: Secondary | ICD-10-CM

## 2016-12-01 DIAGNOSIS — M542 Cervicalgia: Secondary | ICD-10-CM | POA: Insufficient documentation

## 2016-12-01 DIAGNOSIS — I359 Nonrheumatic aortic valve disorder, unspecified: Secondary | ICD-10-CM

## 2016-12-01 DIAGNOSIS — J449 Chronic obstructive pulmonary disease, unspecified: Secondary | ICD-10-CM

## 2016-12-01 DIAGNOSIS — M1710 Unilateral primary osteoarthritis, unspecified knee: Secondary | ICD-10-CM | POA: Insufficient documentation

## 2016-12-01 DIAGNOSIS — I639 Cerebral infarction, unspecified: Secondary | ICD-10-CM

## 2016-12-01 DIAGNOSIS — E7800 Pure hypercholesterolemia, unspecified: Secondary | ICD-10-CM | POA: Diagnosis not present

## 2016-12-01 DIAGNOSIS — M179 Osteoarthritis of knee, unspecified: Secondary | ICD-10-CM | POA: Insufficient documentation

## 2016-12-01 NOTE — Telephone Encounter (Signed)
Patient states he has an appointment this morning and he does not need the form completed

## 2016-12-01 NOTE — Patient Instructions (Signed)
Nice to meet you. Please continue with exercise. Please monitor the tingling in her right hand. If this does not improve please let us know so we can proceed with x-rays and further evaluation. We'll have you return for fasting lab work.

## 2016-12-01 NOTE — Progress Notes (Signed)
 , MD Phone: [phone]  Alexander Barton is a 66 y.o. male who presents today for follow-up.  Patient recently completed pulmonary rehabilitation for dyspnea. This has improved quite a bit. He knows how to manage it when he does get it. He's been on Spiriva with a spacer as well as Symbicort. Those things will help. Is following with pulmonology.  He has had an aortic valve replacement. Followed by cardiology for this. Currently on Coumadin. Does INR self checks at home.  History of stroke and TIA in the past. Notes the first one occurred when his INR was low. He does take aspirin. He has been intolerant of Lipitor and Crestor in the past due to myalgias. Most recent cholesterol was well controlled. No recent check.  Bilateral knee OA has improved significantly with exercising. Almost gone.  Patient does report intermittent history of right scapular pain and shoulder pain with paresthesias down the radial aspect of his right arm. Notes tingling in the thumb through middle finger at times. Notes the pain has improved significantly with exercise though the tingling does remain. No other numbness or tingling.  PMH: nonsmoker.   ROS see history of present illness  Objective  Physical Exam Vitals:   12/01/16 1028  BP: 100/62  Pulse: 66  Temp: 98 F (36.7 C)  SpO2: 97%    BP Readings from Last 3 Encounters:  12/01/16 100/62  11/07/16 108/80  07/22/16 128/68   Wt Readings from Last 3 Encounters:  12/01/16 232 lb (105.2 kg)  11/21/16 230 lb (104.3 kg)  11/07/16 230 lb (104.3 kg)    Physical Exam  Constitutional: No distress.  HENT:  Head: Normocephalic and atraumatic.  Mouth/Throat: Oropharynx is clear and moist. No oropharyngeal exudate.  Eyes: Pupils are equal, round, and reactive to light. Conjunctivae are normal.  Cardiovascular: Normal rate, regular rhythm and normal heart sounds.   Pulmonary/Chest: Effort normal and breath sounds normal.    Musculoskeletal: He exhibits no edema.  No midline neck tenderness, no midline neck step-off, no muscular neck tenderness, no scapular tenderness, no shoulder tenderness, full range of motion bilateral shoulders, slight discomfort right shoulder on internal rotation actively  Neurological: He is alert. Gait normal.  CN 2-12 intact, 5/5 strength in bilateral biceps, triceps, grip, quads, hamstrings, plantar and dorsiflexion, sensation to light touch intact in bilateral UE and LE  Skin: Skin is warm and dry. He is not diaphoretic.     Assessment/Plan: Please see individual problem list.  Aortic valve disease Continue to follow with cardiology.  Chronic obstructive pulmonary disease (HCC) Stable on current medications. Just completed cardiac rehabilitation. Exercising on his own now. Monitor symptoms.  Cerebral infarction (HCC) History of CVA. Has been intolerant of statins. Has been using some essential oils recently. Check lipid panel. Continue Coumadin.  Hyperlipidemia Check lipid panel.  Neck pain Chronic issue. Has been improving with exercise. Does get some paresthesias into his radial aspect right hand. Discussed cervical spine x-ray though he deferred and opted to see if it improves with continued exercise. If not improving he'll contact us for imaging and likely referral.  Knee osteoarthritis Improved with exercise.   Orders Placed This Encounter  Procedures  . Lipid panel    Standing Status:   Future    Standing Expiration Date:   12/01/2017  . Comp Met (CMET)    Standing Status:   Future    Standing Expiration Date:   12/01/2017    , MD [facility]  

## 2016-12-01 NOTE — Assessment & Plan Note (Signed)
History of CVA. Has been intolerant of statins. Has been using some essential oils recently. Check lipid panel. Continue Coumadin.

## 2016-12-01 NOTE — Assessment & Plan Note (Signed)
Continue to follow with cardiology.

## 2016-12-01 NOTE — Assessment & Plan Note (Signed)
Improved with exercise. 

## 2016-12-01 NOTE — Assessment & Plan Note (Signed)
Stable on current medications. Just completed cardiac rehabilitation. Exercising on his own now. Monitor symptoms.

## 2016-12-01 NOTE — Assessment & Plan Note (Signed)
Check lipid panel  

## 2016-12-01 NOTE — Telephone Encounter (Signed)
Noted  

## 2016-12-01 NOTE — Assessment & Plan Note (Signed)
Chronic issue. Has been improving with exercise. Does get some paresthesias into his radial aspect right hand. Discussed cervical spine x-ray though he deferred and opted to see if it improves with continued exercise. If not improving he'll contact us for imaging and likely referral.

## 2016-12-02 DIAGNOSIS — I639 Cerebral infarction, unspecified: Secondary | ICD-10-CM | POA: Insufficient documentation

## 2016-12-02 DIAGNOSIS — E7800 Pure hypercholesterolemia, unspecified: Secondary | ICD-10-CM | POA: Insufficient documentation

## 2016-12-02 DIAGNOSIS — Z8673 Personal history of transient ischemic attack (TIA), and cerebral infarction without residual deficits: Secondary | ICD-10-CM | POA: Insufficient documentation

## 2016-12-02 DIAGNOSIS — M1990 Unspecified osteoarthritis, unspecified site: Secondary | ICD-10-CM | POA: Insufficient documentation

## 2016-12-02 DIAGNOSIS — J449 Chronic obstructive pulmonary disease, unspecified: Secondary | ICD-10-CM | POA: Insufficient documentation

## 2016-12-02 DIAGNOSIS — G459 Transient cerebral ischemic attack, unspecified: Secondary | ICD-10-CM | POA: Insufficient documentation

## 2016-12-02 DIAGNOSIS — K219 Gastro-esophageal reflux disease without esophagitis: Secondary | ICD-10-CM | POA: Insufficient documentation

## 2016-12-04 ENCOUNTER — Ambulatory Visit: Payer: Medicare Other

## 2016-12-08 ENCOUNTER — Other Ambulatory Visit: Payer: Medicare Other

## 2016-12-10 ENCOUNTER — Encounter (INDEPENDENT_AMBULATORY_CARE_PROVIDER_SITE_OTHER): Payer: Self-pay

## 2016-12-10 ENCOUNTER — Telehealth: Payer: Self-pay | Admitting: *Deleted

## 2016-12-10 ENCOUNTER — Other Ambulatory Visit (INDEPENDENT_AMBULATORY_CARE_PROVIDER_SITE_OTHER): Payer: Medicare Other

## 2016-12-10 DIAGNOSIS — Z8673 Personal history of transient ischemic attack (TIA), and cerebral infarction without residual deficits: Secondary | ICD-10-CM | POA: Diagnosis not present

## 2016-12-10 LAB — COMPREHENSIVE METABOLIC PANEL
ALT: 21 U/L (ref 0–53)
AST: 26 U/L (ref 0–37)
Albumin: 4.2 g/dL (ref 3.5–5.2)
Alkaline Phosphatase: 56 U/L (ref 39–117)
BUN: 22 mg/dL (ref 6–23)
CO2: 25 mEq/L (ref 19–32)
Calcium: 9.4 mg/dL (ref 8.4–10.5)
Chloride: 105 mEq/L (ref 96–112)
Creatinine, Ser: 1.26 mg/dL (ref 0.40–1.50)
GFR: 60.45 mL/min (ref >60.00)
Glucose, Bld: 118 mg/dL — ABNORMAL HIGH (ref 70–99)
Potassium: 4.3 mEq/L (ref 3.5–5.1)
Sodium: 137 mEq/L (ref 135–145)
Total Bilirubin: 0.7 mg/dL (ref 0.2–1.2)
Total Protein: 7.3 g/dL (ref 6.0–8.3)

## 2016-12-10 LAB — LIPID PANEL
Cholesterol: 253 mg/dL — ABNORMAL HIGH (ref 0–200)
HDL: 45 mg/dL (ref >39.00)
LDL Cholesterol: 185 mg/dL — ABNORMAL HIGH (ref 0–99)
NonHDL: 207.61
Total CHOL/HDL Ratio: 6
Triglycerides: 112 mg/dL (ref 0.0–149.0)
VLDL: 22.4 mg/dL (ref 0.0–40.0)

## 2016-12-10 NOTE — Telephone Encounter (Signed)
Copied from [place] #[id number]. Topic: Inquiry >> Dec 10, 2016  4:03 PM Alexander Barton, NT wrote: Reason for CRM:pt states he is on his own I & R program, and when he got labs done today he forgot to mention the I n R test and wants to know if yall can add that? Contact pt if you have question.  Returned call to patient and advised patient that the INR cannot be added due to the right tube was not collected for this particular test with e labs that were drawn today, patient has called his cardiologist to verify when he needs his next INR and he will return call if assistance needed. FYI

## 2016-12-11 NOTE — Telephone Encounter (Signed)
Noted.  Patient should contact cardiology office.

## 2016-12-15 NOTE — Progress Notes (Signed)
Alexander Barton Date of Birth: Jan 20, 1951 Medical Record #[id number]  History of Present Illness: Alexander Barton is seen today for followup of valvular heart disease. He is status post mechanical aortic valve replacement in October 2008 with a #23 mm ON-X mechanical valve conduit with a button Bentall procedure and Hemi arch graft  and is on chronic Coumadin. He had a TIA in January 2012 and was admitted in May 2014 with a right thalamic CVA. He is now on aspirin and Coumadin.  His evaluation in the hospital included an echocardiogram which showed normal valve function. Carotid Dopplers were OK.  He was admitted in May 2018 at [facility] with worsening dyspnea. Seen by our service there. CTA of the chest demonstrated no evidence of PE or significant parenchymal lung disease. There is evidence of prior mechanical AVR and hemi-arch replacement. Coronary artery calcification is also noted as was cholelithiasis.  Myoview study was done and was normal. Echo also done as noted below.   On follow up today he reports feeling well. He denies any chest pain. He completed pulmonary Rehab at [facility] and is now in their maintenance program. His breathing is better but still notes dyspnea in the heat or going up incline. He quit taking Crestor due to arthralgias.   Current Outpatient Medications on File Prior to Visit  Medication Sig Dispense Refill  . acidophilus (RISAQUAD) CAPS capsule Take 2 capsules by mouth daily.    Marland Kitchen albuterol (PROVENTIL HFA;VENTOLIN HFA) 108 (90 Base) MCG/ACT inhaler Inhale 2 puffs into the lungs every 4 (four) hours as needed for wheezing or shortness of breath. 1 Inhaler 3  . Apoaequorin (PREVAGEN) 10 MG CAPS Take 10 mg by mouth. Reported on 08/17/2015    . aspirin 81 MG tablet Take 81 mg by mouth at bedtime.     . budesonide-formoterol (SYMBICORT) 160-4.5 MCG/ACT inhaler Inhale 2 puffs into the lungs 2 (two) times daily. 1 Inhaler 12  . cholecalciferol (VITAMIN D) 1000  units tablet Take 1,000 Units by mouth daily.    . metoprolol succinate (TOPROL-XL) 50 MG 24 hr tablet TAKE 1 TABLET (50 MG TOTAL) BY MOUTH DAILY. TAKE WITH OR IMMEDIATELY FOLLOWING A MEAL. 90 tablet 1  . Multiple Vitamin (MULTIVITAMIN WITH MINERALS) TABS tablet Take 1 tablet by mouth 2 (two) times daily.    . Nutritional Supplements (VITAMIN D PLUS COFACTORS PO) Take 1 capsule by mouth every evening.    Marland Kitchen omega-3 acid ethyl esters (LOVAZA) 1 g capsule Take 1 g by mouth every evening.    Marland Kitchen SPIRULINA PO Take 3 capsules by mouth daily.    . tamsulosin (FLOMAX) 0.4 MG CAPS capsule Take 2 capsules (0.8 mg total) by mouth daily. 180 capsule 1  . Tiotropium Bromide Monohydrate (SPIRIVA RESPIMAT) 2.5 MCG/ACT AERS Inhale 2.5 mcg into the lungs 2 (two) times daily. 1 Inhaler 12  . warfarin (COUMADIN) 2 MG tablet TAKE 1 TABLET WITH 5 MG TABLET DAILY AS DIRECTED BY COUMADIN CLINIC 90 tablet 0  . warfarin (COUMADIN) 5 MG tablet Take 5-10 mg by mouth at bedtime. 10mg  on Monday, Wednesday, Friday. Take with 2mg  tab for a total 7mg  on all other days.     No current facility-administered medications on file prior to visit.     Allergies  Allergen Reactions  . Codeine Anaphylaxis  . Hydrocodone Anaphylaxis  . Statins Other (See Comments)    Muscle spasm  . Hydromorphone Other (See Comments)    Pt says he has never taken  .  Ivp Dye [Iodinated Diagnostic Agents] Other (See Comments)    Uncontrollable sneezing, needs sedative the night before and hour and benadryl before using  . Lipitor [Atorvastatin Calcium] Other (See Comments)    Myalgias   . Other     Hydromorphone  . Seroquel [Quetiapine Fumerate] Other (See Comments)    "bad trip" tongue swell    Past Medical History:  Diagnosis Date  . Aortic aneurysm, thoracic ([place])   . Aortic valve disease   . Arthritis   . BPH (benign prostatic hyperplasia)   . COPD (chronic obstructive pulmonary disease) ([place])   . GERD (gastroesophageal reflux  disease)   . HTN (hypertension)   . Hypercholesterolemia   . Stroke ([place])   . TIA (transient ischemic attack)     Past Surgical History:  Procedure Laterality Date  . ACHILLES TENDON REPAIR    . AORTIC VALVE REPLACEMENT     #23 On-X valve conduit  . ASCENDING AORTIC ANEURYSM REPAIR    . BACK SURGERY    . FOOT SURGERY      Social History   Tobacco Use  Smoking Status Never Smoker  Smokeless Tobacco Never Used    Social History   Substance and Sexual Activity  Alcohol Use No  . Alcohol/week: 0.0 oz    Family History  Problem Relation Age of Onset  . Alzheimer's disease Father   . Diabetes Father        age onset DM  . Hypertension Sister   . Hypertension Brother   . Diabetes Mother   . Diabetes Sister   . Colon cancer Neg Hx     Review of Systems: As noted in history of present illness.  All other systems were reviewed and are negative.  Physical Exam: BP 112/68   Pulse 63   Ht 6\' 1"  (1.854 m)   Wt 228 lb 9.6 oz (103.7 kg)   SpO2 98%   BMI 30.16 kg/m  GENERAL:  Well appearing WM HEENT:  PERRL, EOMI, sclera are clear. Oropharynx is clear. NECK:  No jugular venous distention, carotid upstroke brisk and symmetric, no bruits, no thyromegaly or adenopathy LUNGS:  Clear to auscultation bilaterally CHEST:  Unremarkable HEART:  RRR,  PMI not displaced or sustained, good mechanical AV click,  no S3, no S4: no clicks, no rubs, no murmurs ABD:  Soft, nontender. BS +, no masses or bruits. No hepatomegaly, no splenomegaly EXT:  2 + pulses throughout, no edema, no cyanosis no clubbing SKIN:  Warm and dry.  No rashes NEURO:  Alert and oriented x 3. Cranial nerves II through XII intact. PSYCH:  Cognitively intact   LABORATORY DATA:  Lab Results  Component Value Date   WBC 4.5 06/24/2016   HGB 13.3 06/24/2016   HCT 37.5 (L) 06/24/2016   PLT 174 06/24/2016   GLUCOSE 118 (H) 12/10/2016   CHOL 253 (H) 12/10/2016   TRIG 112.0 12/10/2016   HDL 45.00 12/10/2016    LDLDIRECT 172.0 11/14/2014   LDLCALC 185 (H) 12/10/2016   ALT 21 12/10/2016   AST 26 12/10/2016   NA 137 12/10/2016   K 4.3 12/10/2016   CL 105 12/10/2016   CREATININE 1.26 12/10/2016   BUN 22 12/10/2016   CO2 25 12/10/2016   TSH 1.78 12/10/2010   PSA 0.34 11/14/2014   INR 3.3 11/21/2016   HGBA1C 6.2 10/03/2015   Echo 06/25/16: Study Conclusions  - Procedure narrative: Transthoracic echocardiography. The study   was technically difficult. - Left ventricle: The  cavity size was normal. There was mild   concentric hypertrophy. Systolic function was mildly reduced. The   estimated ejection fraction was in the range of 45% to 50%.   Doppler parameters are consistent with abnormal left ventricular   relaxation (grade 1 diastolic dysfunction). - Aortic valve: A mechanical prosthesis was present and functioning   normally. Valve area (Vmax): 1.87 cm^2. - Mitral valve: There was mild regurgitation. - Left atrium: The atrium was moderately dilated. - Right atrium: The atrium was mildly dilated. - Pulmonary arteries: Systolic pressure was within the normal   range.  Myoview 06/25/16: Pharmacological myocardial perfusion imaging study with no significant  ischemia Normal wall motion, EF estimated at 50% No EKG changes concerning for ischemia at peak stress or in recovery. Low risk scan   Signed, Esmond Plants, MD, Ph.D [facility]  Assessment / Plan: 1. Status post mechanical aortic valve replacement. Continue anticoagulation with Coumadin. Echo in May 2018 showed normal valve function. Cardiac [facility] at [facility] in 2017 was satisfactory. Valve exam is normal.  2. Hypertension-controlled 3. Status post CVA/right thalamic. Continue combined aspirin and Coumadin therapy. INR 3.2 today 4. Hypercholesterolemia. History of intolerance to lipitor. And Crestor well with LDL. He asked my opinion about PCSK 9 inhibitor. I think this would be a good option given history of CVA. A lot will depend on  its affordability. Will check with his pharmacy program for cost.

## 2016-12-16 ENCOUNTER — Ambulatory Visit: Payer: Medicare Other | Admitting: Cardiology

## 2016-12-16 ENCOUNTER — Encounter: Payer: Self-pay | Admitting: Cardiology

## 2016-12-16 ENCOUNTER — Ambulatory Visit (INDEPENDENT_AMBULATORY_CARE_PROVIDER_SITE_OTHER): Payer: Medicare Other | Admitting: Pharmacist Clinician (PhC)/ Clinical Pharmacy Specialist

## 2016-12-16 VITALS — BP 112/68 | HR 63 | Ht 73.0 in | Wt 228.6 lb

## 2016-12-16 DIAGNOSIS — Z952 Presence of prosthetic heart valve: Secondary | ICD-10-CM | POA: Diagnosis not present

## 2016-12-16 DIAGNOSIS — I359 Nonrheumatic aortic valve disorder, unspecified: Secondary | ICD-10-CM

## 2016-12-16 DIAGNOSIS — I712 Thoracic aortic aneurysm, without rupture, unspecified: Secondary | ICD-10-CM

## 2016-12-16 DIAGNOSIS — I10 Essential (primary) hypertension: Secondary | ICD-10-CM

## 2016-12-16 DIAGNOSIS — Z7901 Long term (current) use of anticoagulants: Secondary | ICD-10-CM

## 2016-12-16 DIAGNOSIS — E7800 Pure hypercholesterolemia, unspecified: Secondary | ICD-10-CM | POA: Diagnosis not present

## 2016-12-16 LAB — POCT INR: INR: 3.2

## 2016-12-16 NOTE — Patient Instructions (Signed)
Continue your current therapy  I will see you in one year   

## 2016-12-17 NOTE — Telephone Encounter (Signed)
Copied from [place] [id number]. Topic: Quick Communication - See Telephone Encounter >> Dec 17, 2016  2:35 PM Hewitt Shorts wrote: CRM for notification. See Telephone encounter for: pt is wanting to talk with dr [name] about going back to crestor not repatha it is too expensive   Best number [phone]  12/17/16.

## 2016-12-18 NOTE — Telephone Encounter (Signed)
Can we call and try to schedule patient for an OV when Dr. [name] gets back to discuss medication changes?

## 2016-12-19 NOTE — Telephone Encounter (Signed)
I called pt to sch, pt states he has one month of crestor left and wants to wait until he's done with it to make an appt.

## 2016-12-22 ENCOUNTER — Ambulatory Visit (INDEPENDENT_AMBULATORY_CARE_PROVIDER_SITE_OTHER): Payer: Medicare Other

## 2016-12-22 VITALS — BP 106/62 | HR 56 | Temp 98.0°F | Resp 14 | Ht 73.0 in | Wt 227.1 lb

## 2016-12-22 DIAGNOSIS — Z0000 Encounter for general adult medical examination without abnormal findings: Secondary | ICD-10-CM

## 2016-12-22 DIAGNOSIS — Z1331 Encounter for screening for depression: Secondary | ICD-10-CM | POA: Diagnosis not present

## 2016-12-22 DIAGNOSIS — Z23 Encounter for immunization: Secondary | ICD-10-CM

## 2016-12-22 DIAGNOSIS — Z1159 Encounter for screening for other viral diseases: Secondary | ICD-10-CM

## 2016-12-22 DIAGNOSIS — R739 Hyperglycemia, unspecified: Secondary | ICD-10-CM

## 2016-12-22 LAB — HEMOGLOBIN A1C: Hgb A1c MFr Bld: 6.1 % (ref 4.6–6.5)

## 2016-12-22 NOTE — Progress Notes (Signed)
Subjective:   Alexander Barton is a 66 y.o. male who presents for Medicare Annual/Subsequent preventive examination.  Review of Systems:  No ROS.  Medicare Wellness Visit. Additional risk factors are reflected in the social history.  Cardiac Risk Factors include: advanced age (>43men, >47 women);hypertension;male gender     Objective:    Vitals: BP 106/62 (BP Location: Left Arm, Patient Position: Sitting, Cuff Size: Normal)   Pulse (!) 56   Temp 98 F (36.7 C) (Oral)   Resp 14   Ht 6\' 1"  (1.854 m)   Wt 227 lb 1.9 oz (103 kg)   SpO2 97%   BMI 29.96 kg/m   Body mass index is 29.96 kg/m.  Tobacco Social History   Tobacco Use  Smoking Status Never Smoker  Smokeless Tobacco Never Used     Counseling given: Not Answered   Past Medical History:  Diagnosis Date  . Aortic aneurysm, thoracic ([place])   . Aortic valve disease   . Arthritis   . BPH (benign prostatic hyperplasia)   . COPD (chronic obstructive pulmonary disease) ([place])   . GERD (gastroesophageal reflux disease)   . HTN (hypertension)   . Hypercholesterolemia   . Stroke ([place])   . TIA (transient ischemic attack)    Past Surgical History:  Procedure Laterality Date  . ACHILLES TENDON REPAIR    . AORTIC VALVE REPLACEMENT     #23 On-X valve conduit  . ASCENDING AORTIC ANEURYSM REPAIR    . BACK SURGERY    . FOOT SURGERY     Family History  Problem Relation Age of Onset  . Alzheimer's disease Father   . Diabetes Father        age onset DM  . Hypertension Sister   . Hypertension Brother   . Diabetes Mother   . Diabetes Sister   . Colon cancer Neg Hx    Social History   Substance and Sexual Activity  Sexual Activity Not Currently    Outpatient Encounter Medications as of 12/22/2016  Medication Sig  . acidophilus (RISAQUAD) CAPS capsule Take 2 capsules by mouth daily.  Marland Kitchen albuterol (PROVENTIL HFA;VENTOLIN HFA) 108 (90 Base) MCG/ACT inhaler Inhale 2 puffs into the lungs every 4 (four) hours as  needed for wheezing or shortness of breath.  . Apoaequorin (PREVAGEN) 10 MG CAPS Take 10 mg by mouth. Reported on 08/17/2015  . aspirin 81 MG tablet Take 81 mg by mouth at bedtime.   . budesonide-formoterol (SYMBICORT) 160-4.5 MCG/ACT inhaler Inhale 2 puffs into the lungs 2 (two) times daily.  . cholecalciferol (VITAMIN D) 1000 units tablet Take 1,000 Units by mouth daily.  . metoprolol succinate (TOPROL-XL) 50 MG 24 hr tablet TAKE 1 TABLET (50 MG TOTAL) BY MOUTH DAILY. TAKE WITH OR IMMEDIATELY FOLLOWING A MEAL.  . Multiple Vitamin (MULTIVITAMIN WITH MINERALS) TABS tablet Take 1 tablet by mouth 2 (two) times daily.  . Nutritional Supplements (VITAMIN D PLUS COFACTORS PO) Take 1 capsule by mouth every evening.  Marland Kitchen omega-3 acid ethyl esters (LOVAZA) 1 g capsule Take 1 g by mouth every evening.  . rosuvastatin (CRESTOR) 20 MG tablet Take 20 mg daily by mouth.  . SPIRULINA PO Take 3 capsules by mouth daily.  . tamsulosin (FLOMAX) 0.4 MG CAPS capsule Take 2 capsules (0.8 mg total) by mouth daily.  . Tiotropium Bromide Monohydrate (SPIRIVA RESPIMAT) 2.5 MCG/ACT AERS Inhale 2.5 mcg into the lungs 2 (two) times daily.  Marland Kitchen warfarin (COUMADIN) 2 MG tablet TAKE 1 TABLET WITH 5  MG TABLET DAILY AS DIRECTED BY COUMADIN CLINIC  . warfarin (COUMADIN) 5 MG tablet Take 5-10 mg by mouth at bedtime. 10mg  on Monday, Wednesday, Friday. Take with 2mg  tab for a total 7mg  on all other days.   No facility-administered encounter medications on file as of 12/22/2016.     Activities of Daily Living In your present state of health, do you have any difficulty performing the following activities: 12/22/2016 06/24/2016  Hearing? N N  Vision? N N  Difficulty concentrating or making decisions? Y N  Comment Difficulty concentrating since having the stroke -  Walking or climbing stairs? Y Y  Comment SOB on exertion SOB  Dressing or bathing? N N  Doing errands, shopping? N N  Preparing Food and eating ? N -  Using the Toilet? N  -  In the past six months, have you accidently leaked urine? N -  Do you have problems with loss of bowel control? N -  Managing your Medications? N -  Managing your Finances? N -  Housekeeping or managing your Housekeeping? N -  Some recent data might be hidden    Patient Care Team: Leone Haven, MD as PCP - General (Family Medicine)   Assessment:    This is a routine wellness examination for Alexander Barton. The goal of the wellness visit is to assist the patient how to close the gaps in care and create a preventative care plan for the patient.   The roster of all physicians providing medical care to patient is listed in the Snapshot section of the chart.  Taking calcium VIT D as appropriate/Osteoporosis risk reviewed.    Safety issues reviewed; Smoke and carbon monoxide detectors in the home. No firearms in the home.  Wears seatbelts when driving or riding with others. Patient does wear sunscreen or protective clothing when in direct sunlight. No violence in the home.  Depression- PHQ 2 &9 complete.  No signs/symptoms or verbal communication regarding little pleasure in doing things, feeling down, depressed or hopeless. No changes in sleeping, energy, eating, concentrating.  No thoughts of self harm or harm towards others.  Time spent on this topic is 8 minutes.   Patient is alert, normal appearance, oriented to person/place/and time. Correctly identified the president of the [place], recall of 3/3 words, and performing simple calculations. Displays appropriate judgement and can read correct time from watch face.   No new identified risk were noted.  No failures at ADL's or IADL's.    BMI- discussed the importance of a healthy diet, water intake and the benefits of aerobic exercise. Educational material provided.   24 hour diet recall: Low carb foods  Daily fluid intake: 1 cups of caffeine, 8-10 cups of water  Dental- every 6 months.  Eye- Visual acuity not assessed per patient  preference since they have regular follow up with the ophthalmologist.  Wears corrective lenses.  Sleep patterns- Sleeps 8 hours at night.  Wakes feeling rested.  Hepatitis C screening and A1C ordered; follow as directed.  Educational material provided.  Health maintenance gaps- closed.  Patient Concerns: None at this time. Follow up with PCP as needed.   Exercise Activities and Dietary recommendations Current Exercise Habits: Structured exercise class, Type of exercise: walking;stretching;treadmill;calisthenics, Time (Minutes): 45, Frequency (Times/Week): 3, Weekly Exercise (Minutes/Week): 135, Intensity: Mild  Goals    . Increase physical activity     Add Nu Step to exercise regimen as tolerated      Fall Risk Fall Risk  12/22/2016  11/19/2016 11/05/2016 08/12/2016 12/05/2015  Falls in the past year? No No No No No   Depression Screen PHQ 2/9 Scores 12/22/2016 11/19/2016 11/05/2016 08/12/2016  PHQ - 2 Score 0 0 1 0  PHQ- 9 Score 0 5 8 2     Cognitive Function MMSE - Mini Mental State Exam 12/22/2016 12/05/2015  Orientation to time 5 5  Orientation to Place 5 5  Registration 3 3  Attention/ Calculation 5 5  Recall 3 3  Language- name 2 objects 2 2  Language- repeat 1 1  Language- follow 3 step command 3 3  Language- read & follow direction 1 1  Write a sentence 1 1  Copy design 1 1  Total score 30 30        Immunization History  Administered Date(s) Administered  . Influenza,inj,Quad PF,6+ Mos 11/14/2014  . Pneumococcal Conjugate-13 11/14/2014  . Pneumococcal Polysaccharide-23 12/05/2015   Screening Tests Health Maintenance  Topic Date Due  . Hepatitis C Screening  Mar 31, 1950  . FOOT EXAM  09/24/1958  . OPHTHALMOLOGY EXAM  09/24/1958  . URINE MICROALBUMIN  09/24/1958  . HEMOGLOBIN A1C  04/02/2016  . TETANUS/TDAP  02/03/2021  . COLONOSCOPY  10/11/2024  . INFLUENZA VACCINE  Completed  . PNA vac Low Risk Adult  Completed      Plan:    End of life planning;  Advance aging; Advanced directives discussed. Copy of current HCPOA/Living Will on file.    I have personally reviewed and noted the following in the patient's chart:   . Medical and social history . Use of alcohol, tobacco or illicit drugs  . Current medications and supplements . Functional ability and status . Nutritional status . Physical activity . Advanced directives . List of other physicians . Hospitalizations, surgeries, and ER visits in previous 12 months . Vitals . Screenings to include cognitive, depression, and falls . Referrals and appointments  In addition, I have reviewed and discussed with patient certain preventive protocols, quality metrics, and best practice recommendations. A written personalized care plan for preventive services as well as general preventive health recommendations were provided to patient.     OBrien-Blaney, Deshane Cotroneo L, LPN  [id number]   I have reviewed the above information and agree with above.   Deborra Medina, MD

## 2016-12-22 NOTE — Patient Instructions (Addendum)
  [name] , Thank you for taking time to come for your Medicare Wellness Visit. I appreciate your ongoing commitment to your health goals. Please review the following plan we discussed and let me know if I can assist you in the future.   Labs today   These are the goals we discussed: Goals    . Increase physical activity     Add Nu Step to exercise regimen as tolerated       This is a list of the screening recommended for you and due dates:  Health Maintenance  Topic Date Due  .  Hepatitis C: One time screening is recommended by Center for Disease Control  (CDC) for  adults born from [birth year] through 1967.   Jun 06, 1950  . Complete foot exam   09/24/1958  . Eye exam for diabetics  09/24/1958  . Urine Protein Check  09/24/1958  . Hemoglobin A1C  04/02/2016  . Tetanus Vaccine  02/03/2021  . Colon Cancer Screening  10/11/2024  . Flu Shot  Completed  . Pneumonia vaccines  Completed

## 2016-12-23 LAB — HEPATITIS C ANTIBODY
Hepatitis C Ab: NONREACTIVE
SIGNAL TO CUT-OFF: 0.01 (ref <1.00)

## 2016-12-29 ENCOUNTER — Other Ambulatory Visit: Payer: Self-pay | Admitting: Family Medicine

## 2016-12-29 NOTE — Telephone Encounter (Signed)
Pt requesting a refill on Metoprolol. Cannot determine who prescribed it.  PLease advise

## 2016-12-29 NOTE — Telephone Encounter (Signed)
Copied from [place]. >> Dec 29, 2016 10:26 AM Neva Seat wrote: [facility] Pt's Metaprolol is being denied at the pharmacy. Pt thinks that the Rx was written last by Dr. Lacinda Axon and this is why his Rx is being denied.

## 2016-12-29 NOTE — Telephone Encounter (Signed)
Last OV 12/01/16 last filled by DR.Cook 06/04/16 90 1rf

## 2016-12-29 NOTE — Telephone Encounter (Signed)
Please advise 

## 2016-12-30 ENCOUNTER — Encounter: Payer: Self-pay | Admitting: *Deleted

## 2016-12-30 LAB — POCT INR: INR: 3.2

## 2017-01-01 ENCOUNTER — Ambulatory Visit (INDEPENDENT_AMBULATORY_CARE_PROVIDER_SITE_OTHER): Payer: Medicare Other | Admitting: Pharmacist Clinician (PhC)/ Clinical Pharmacy Specialist

## 2017-01-01 DIAGNOSIS — Z7901 Long term (current) use of anticoagulants: Secondary | ICD-10-CM

## 2017-01-01 DIAGNOSIS — I359 Nonrheumatic aortic valve disorder, unspecified: Secondary | ICD-10-CM

## 2017-01-01 MED ORDER — METOPROLOL SUCCINATE ER 50 MG PO TB24: 50.0000 mg | ORAL_TABLET | Freq: Every day | ORAL | 1 refills | Status: DC

## 2017-01-18 LAB — POCT INR: INR: 2.9

## 2017-01-19 ENCOUNTER — Ambulatory Visit (INDEPENDENT_AMBULATORY_CARE_PROVIDER_SITE_OTHER): Payer: Medicare Other | Admitting: Pharmacist

## 2017-01-19 DIAGNOSIS — Z7901 Long term (current) use of anticoagulants: Secondary | ICD-10-CM

## 2017-01-19 DIAGNOSIS — I359 Nonrheumatic aortic valve disorder, unspecified: Secondary | ICD-10-CM

## 2017-02-13 ENCOUNTER — Ambulatory Visit (INDEPENDENT_AMBULATORY_CARE_PROVIDER_SITE_OTHER): Payer: Medicare Other | Admitting: Pharmacist Clinician (PhC)/ Clinical Pharmacy Specialist

## 2017-02-13 DIAGNOSIS — Z952 Presence of prosthetic heart valve: Secondary | ICD-10-CM | POA: Diagnosis not present

## 2017-02-13 DIAGNOSIS — I359 Nonrheumatic aortic valve disorder, unspecified: Secondary | ICD-10-CM

## 2017-02-13 DIAGNOSIS — Z7901 Long term (current) use of anticoagulants: Secondary | ICD-10-CM | POA: Diagnosis not present

## 2017-02-13 DIAGNOSIS — I639 Cerebral infarction, unspecified: Secondary | ICD-10-CM | POA: Diagnosis not present

## 2017-02-13 LAB — POCT INR: INR: 2.5

## 2017-03-05 LAB — POCT INR: INR: 2.4

## 2017-03-06 ENCOUNTER — Ambulatory Visit (INDEPENDENT_AMBULATORY_CARE_PROVIDER_SITE_OTHER): Payer: Medicare Other | Admitting: Pharmacist

## 2017-03-06 DIAGNOSIS — I359 Nonrheumatic aortic valve disorder, unspecified: Secondary | ICD-10-CM

## 2017-03-06 DIAGNOSIS — Z7901 Long term (current) use of anticoagulants: Secondary | ICD-10-CM

## 2017-03-07 ENCOUNTER — Other Ambulatory Visit: Payer: Self-pay | Admitting: Cardiology

## 2017-03-21 LAB — POCT INR: INR: 2.5

## 2017-03-23 ENCOUNTER — Ambulatory Visit (INDEPENDENT_AMBULATORY_CARE_PROVIDER_SITE_OTHER): Payer: Medicare Other | Admitting: Pharmacist

## 2017-03-23 DIAGNOSIS — Z7901 Long term (current) use of anticoagulants: Secondary | ICD-10-CM

## 2017-03-23 DIAGNOSIS — I359 Nonrheumatic aortic valve disorder, unspecified: Secondary | ICD-10-CM

## 2017-04-13 ENCOUNTER — Ambulatory Visit (INDEPENDENT_AMBULATORY_CARE_PROVIDER_SITE_OTHER): Payer: Medicare Other | Admitting: Pharmacist

## 2017-04-13 DIAGNOSIS — Z7901 Long term (current) use of anticoagulants: Secondary | ICD-10-CM | POA: Diagnosis not present

## 2017-04-13 DIAGNOSIS — I359 Nonrheumatic aortic valve disorder, unspecified: Secondary | ICD-10-CM

## 2017-04-13 LAB — POCT INR: INR: 2.4

## 2017-04-25 ENCOUNTER — Other Ambulatory Visit: Payer: Self-pay | Admitting: Family Medicine

## 2017-04-28 LAB — POCT INR: INR: 2.1

## 2017-04-30 ENCOUNTER — Other Ambulatory Visit: Payer: Self-pay | Admitting: Cardiology

## 2017-05-01 ENCOUNTER — Ambulatory Visit (INDEPENDENT_AMBULATORY_CARE_PROVIDER_SITE_OTHER): Payer: Medicare Other | Admitting: Pharmacist

## 2017-05-01 DIAGNOSIS — Z7901 Long term (current) use of anticoagulants: Secondary | ICD-10-CM | POA: Diagnosis not present

## 2017-05-01 DIAGNOSIS — I359 Nonrheumatic aortic valve disorder, unspecified: Secondary | ICD-10-CM

## 2017-05-04 ENCOUNTER — Other Ambulatory Visit: Payer: Self-pay | Admitting: Cardiology

## 2017-05-20 ENCOUNTER — Ambulatory Visit (INDEPENDENT_AMBULATORY_CARE_PROVIDER_SITE_OTHER): Payer: Medicare Other | Admitting: Pharmacist

## 2017-05-20 DIAGNOSIS — I359 Nonrheumatic aortic valve disorder, unspecified: Secondary | ICD-10-CM

## 2017-05-20 DIAGNOSIS — Z7901 Long term (current) use of anticoagulants: Secondary | ICD-10-CM | POA: Diagnosis not present

## 2017-05-20 LAB — POCT INR: INR: 2.2

## 2017-06-01 ENCOUNTER — Ambulatory Visit: Payer: Medicare Other | Admitting: Family Medicine

## 2017-06-11 ENCOUNTER — Encounter: Payer: Self-pay | Admitting: Internal Medicine

## 2017-06-11 ENCOUNTER — Ambulatory Visit: Payer: Medicare Other | Admitting: Internal Medicine

## 2017-06-11 VITALS — BP 140/82 | HR 76 | Ht 73.0 in | Wt 221.0 lb

## 2017-06-11 DIAGNOSIS — J449 Chronic obstructive pulmonary disease, unspecified: Secondary | ICD-10-CM

## 2017-06-11 NOTE — Patient Instructions (Signed)
Continue Inhalers once daily as you are doing Continue exercise as tolerated follow up with Cardiology regarding coumadin

## 2017-06-11 NOTE — Progress Notes (Signed)
Name: Alexander Barton MRN: [id number] DOB: 09-25-1950     CONSULTATION DATE: 06/11/2017  REFERRING MD : Dr. Lacinda Axon  CHIEF COMPLAINT: Shortness of breath  Office spirometry on July 22, 2016 FEV1 FEC ratio is 60 since her son predicted FEV1 is 2.1 L with 56% predicted FEC is 3.3 L with 63% predicted Taste on office spirometry patient has moderate to severe obstructive airways disease with restrictive lung disease likely related to obesity  HISTORY OF PRESENT ILLNESS:   67 year old pleasant white male seen today for chronic shortness of breath Patient has had shortness of breath for many years Patient doing well with inhaler therapy using inhalers once daily Last albuterol use was 6 months ago Less SOB Less wheezing Very Infrequent albuterol use   no signs of infection at this time No signs of CHF at this time  pulm rehab has really helped   LAST OV I  have dicussed ONO and 6MWT test results with patient  The tests DID NOT reveal hypoxia  He denies wheezing coughing and increased work of breathing No fevers no chills no weight loss   Patient is a nonsmoker however was exposed to secondhand next smoke exposure for approximately 20 years Patient is retired but has worked in the [employer] for approximately 20 years and was exposed to [place]:   SEE HPI No fevers, chills chronic SOB stable ALL OTHER ROS ARE NEGATIVE    Ht 6\' 1"  (1.854 m)   BMI 29.96 kg/m  BP 140/82 (BP Location: Left Arm, Cuff Size: Normal)   Pulse 76   Ht 6\' 1"  (1.854 m)   Wt 221 lb (100.2 kg)   SpO2 98%   BMI 29.16 kg/m    Physical Examination:   GENERAL:NAD, no fevers, chills, no weakness no fatigue HEAD: Normocephalic, atraumatic.  EYES: Pupils equal, round, reactive to light. Extraocular muscles intact. No scleral icterus.  MOUTH: Moist mucosal membrane.   EAR, NOSE, THROAT: Clear without exudates. No external lesions.  NECK: Supple. No thyromegaly. No nodules. No  JVD.  PULMONARY:CTA B/L no wheezes, no crackles, no rhonchi CARDIOVASCULAR: S1 and S2. Regular rate and rhythm. +systolic click GASTROINTESTINAL: Soft, nontender, nondistended. No masses. Positive bowel sounds.  SKIN: No ulceration, lesions, rashes, or cyanosis. Skin warm and dry. Turgor intact.  PSYCHIATRIC: Mood, affect within normal limits. The patient is awake, alert and oriented x 3. Insight, judgment intact.    CT of the chest May 2018 I have Independently reviewed images of  Ct chest   Interpretation: No evidence of pneumonia and no evidence of CHF or evidence of effusions no masses noted   ASSESSMENT / PLAN: 67 year old pleasant white male seen today for progressive shortness of breath and dyspnea on exertion findings  suggest moderate to severe COPD Gold stage C with FEV1 56% predicted in the setting of mechanical valve on chronic Coumadin therapy in the setting of morbid obesity and deconditioned state  #1 shortness breath and dyspnea on exertion  Multifactorial in nature related to COPD as well as cardiac disease and deconditioned state Overall adapting to SOB very well on once daily inhaler usage  #2 moderate to severe COPD based on pulmonary function testing with FEV1 of 56% predicted continueSpiriva RESPIMAT therapy 2.5 once daily continueSymbicort 160 once daily Albuterol as needed  #3 Obesity -recommend significant weight loss -recommend changing diet  #4 Deconditioned state -Recommend increased daily activity and exercise   #5 history of mechanical valve Follow-up cardiology as recommended  Patient satisfied with Plan of action and management. All questions answered Follow-up in 1 year for respiratory assessment   [name] Patricia Pesa, M.D.  [name]  Medical Director [place] Director [facility] Cardio-Pulmonary Department

## 2017-06-12 ENCOUNTER — Ambulatory Visit (INDEPENDENT_AMBULATORY_CARE_PROVIDER_SITE_OTHER): Payer: Medicare Other | Admitting: Pharmacist

## 2017-06-12 DIAGNOSIS — I359 Nonrheumatic aortic valve disorder, unspecified: Secondary | ICD-10-CM

## 2017-06-12 DIAGNOSIS — Z7901 Long term (current) use of anticoagulants: Secondary | ICD-10-CM | POA: Diagnosis not present

## 2017-06-12 LAB — POCT INR: INR: 2.3

## 2017-06-27 LAB — PROTIME-INR

## 2017-06-27 LAB — POCT INR: INR: 4.5 — AB (ref 2.0–3.0)

## 2017-06-30 ENCOUNTER — Ambulatory Visit (INDEPENDENT_AMBULATORY_CARE_PROVIDER_SITE_OTHER): Payer: Medicare Other | Admitting: Pharmacist Clinician (PhC)/ Clinical Pharmacy Specialist

## 2017-06-30 DIAGNOSIS — Z952 Presence of prosthetic heart valve: Secondary | ICD-10-CM

## 2017-06-30 DIAGNOSIS — Z7901 Long term (current) use of anticoagulants: Secondary | ICD-10-CM | POA: Diagnosis not present

## 2017-06-30 DIAGNOSIS — I639 Cerebral infarction, unspecified: Secondary | ICD-10-CM | POA: Diagnosis not present

## 2017-06-30 DIAGNOSIS — I359 Nonrheumatic aortic valve disorder, unspecified: Secondary | ICD-10-CM

## 2017-07-01 ENCOUNTER — Ambulatory Visit: Payer: Medicare Other | Admitting: Family Medicine

## 2017-07-01 ENCOUNTER — Encounter: Payer: Self-pay | Admitting: Family Medicine

## 2017-07-01 DIAGNOSIS — R351 Nocturia: Secondary | ICD-10-CM | POA: Diagnosis not present

## 2017-07-01 DIAGNOSIS — E7800 Pure hypercholesterolemia, unspecified: Secondary | ICD-10-CM | POA: Diagnosis not present

## 2017-07-01 DIAGNOSIS — I10 Essential (primary) hypertension: Secondary | ICD-10-CM | POA: Diagnosis not present

## 2017-07-01 DIAGNOSIS — N401 Enlarged prostate with lower urinary tract symptoms: Secondary | ICD-10-CM | POA: Diagnosis not present

## 2017-07-01 DIAGNOSIS — I359 Nonrheumatic aortic valve disorder, unspecified: Secondary | ICD-10-CM

## 2017-07-01 DIAGNOSIS — E663 Overweight: Secondary | ICD-10-CM | POA: Insufficient documentation

## 2017-07-01 DIAGNOSIS — J449 Chronic obstructive pulmonary disease, unspecified: Secondary | ICD-10-CM

## 2017-07-01 NOTE — Assessment & Plan Note (Signed)
Continue diet and exercise. 

## 2017-07-01 NOTE — Progress Notes (Signed)
  [name], MD Phone: [phone]  Alexander Barton is a 67 y.o. male who presents today for f/u.  CC: HTN, COPD, BPH  HYPERTENSION  Disease Monitoring  Home BP Monitoring controlled at home Chest pain- no    Dyspnea- no Medications  Compliance-  Taking metoprolol.   Edema- no  COPD: Taking Spiriva and Symbicort.  He notes his breathing is significantly improved with his inhalers as well as his increased activity level.  No wheezing.  No cough.  No albuterol use.  BPH: Taking Flomax 0.8 mg daily.  No straining.  Does have some frequency though he has increased his liquid intake.  Nocturia x2.  Does feel like he empties his bladder.  His stream is a little slower as the day goes on.  He has been working on losing weight by eating smaller meals and staying active. he was going to the gym 3 times a week.  He follows with cardiology for his Coumadin and INR.  He is on this for a mechanical valve.  Last INR was 4.5 over the weekend and he spoke with cardiology yesterday who altered his regimen.  Hyperlipidemia: He has been intolerant of statins.  He is currently in a study at [facility] for a cholesterol medication.  They have been checking lab work every month.  He will try to find out what his cholesterol has been.  Social History   Tobacco Use  Smoking Status Never Smoker  Smokeless Tobacco Never Used     ROS see history of present illness  Objective  Physical Exam Vitals:   07/01/17 1105  BP: 112/68  Pulse: 63  Temp: 98.4 F (36.9 C)  SpO2: 97%    BP Readings from Last 3 Encounters:  07/01/17 112/68  06/11/17 140/82  12/22/16 106/62   Wt Readings from Last 3 Encounters:  07/01/17 223 lb 6.4 oz (101.3 kg)  06/11/17 221 lb (100.2 kg)  12/22/16 227 lb 1.9 oz (103 kg)    Physical Exam  Constitutional: No distress.  Cardiovascular: Normal rate and regular rhythm.  Mechanical heart valve sounds noted at right upper sternal border  Pulmonary/Chest: Effort  normal and breath sounds normal.  Musculoskeletal: He exhibits no edema.  Neurological: He is alert.  Skin: Skin is warm and dry. He is not diaphoretic.     Assessment/Plan: Please see individual problem list.  HTN (hypertension) Well-controlled.  Continue metoprolol.  Aortic valve disease Status post replacement.  He will continue to follow with cardiology who is managing his Coumadin.  Chronic obstructive pulmonary disease (HCC) Symptoms have improved significantly.  He will continue to stay active, uses inhalers, and see pulmonology.  BPH (benign prostatic hyperplasia) Adequately controlled.  Continue Flomax.  Hypercholesterolemia He will try to get his cholesterol information for [place].  Hyperlipidemia He will try to get his cholesterol information from [place] for [place].  Overweight Continue diet and exercise.  No orders of the defined types were placed in this encounter.   No orders of the defined types were placed in this encounter.    [name], MD [place]

## 2017-07-01 NOTE — Assessment & Plan Note (Signed)
Status post replacement.  He will continue to follow with cardiology who is managing his Coumadin.

## 2017-07-01 NOTE — Assessment & Plan Note (Signed)
Adequately controlled.  Continue Flomax. 

## 2017-07-01 NOTE — Assessment & Plan Note (Signed)
Well controlled. Continue metoprolol. 

## 2017-07-01 NOTE — Patient Instructions (Signed)
Nice to see you. Please continue to work on dietary changes and exercise. If you are able to find out what your cholesterol has been running please let us know.

## 2017-07-01 NOTE — Assessment & Plan Note (Signed)
Symptoms have improved significantly.  He will continue to stay active, uses inhalers, and see pulmonology.

## 2017-07-01 NOTE — Assessment & Plan Note (Signed)
He will try to get his cholesterol information for [place].

## 2017-07-01 NOTE — Assessment & Plan Note (Signed)
He will try to get his cholesterol information from [place] for [place].

## 2017-07-09 ENCOUNTER — Other Ambulatory Visit: Payer: Self-pay | Admitting: Internal Medicine

## 2017-07-09 ENCOUNTER — Ambulatory Visit (INDEPENDENT_AMBULATORY_CARE_PROVIDER_SITE_OTHER): Payer: Medicare Other | Admitting: Pharmacist Clinician (PhC)/ Clinical Pharmacy Specialist

## 2017-07-09 DIAGNOSIS — I359 Nonrheumatic aortic valve disorder, unspecified: Secondary | ICD-10-CM

## 2017-07-09 DIAGNOSIS — Z952 Presence of prosthetic heart valve: Secondary | ICD-10-CM

## 2017-07-09 DIAGNOSIS — Z7901 Long term (current) use of anticoagulants: Secondary | ICD-10-CM

## 2017-07-09 DIAGNOSIS — G459 Transient cerebral ischemic attack, unspecified: Secondary | ICD-10-CM

## 2017-07-09 LAB — POCT INR: INR: 3.4 — AB (ref 2.0–3.0)

## 2017-07-23 LAB — PROTIME-INR

## 2017-07-24 ENCOUNTER — Ambulatory Visit (INDEPENDENT_AMBULATORY_CARE_PROVIDER_SITE_OTHER): Payer: Medicare Other | Admitting: Pharmacist Clinician (PhC)/ Clinical Pharmacy Specialist

## 2017-07-24 DIAGNOSIS — Z952 Presence of prosthetic heart valve: Secondary | ICD-10-CM | POA: Diagnosis not present

## 2017-07-24 DIAGNOSIS — I639 Cerebral infarction, unspecified: Secondary | ICD-10-CM | POA: Diagnosis not present

## 2017-07-24 DIAGNOSIS — I359 Nonrheumatic aortic valve disorder, unspecified: Secondary | ICD-10-CM

## 2017-07-24 DIAGNOSIS — Z7901 Long term (current) use of anticoagulants: Secondary | ICD-10-CM

## 2017-07-24 LAB — POCT INR: INR: 3.8 — AB (ref 2.0–3.0)

## 2017-08-09 LAB — POCT INR: INR: 3.7 — AB (ref 2.0–3.0)

## 2017-08-10 ENCOUNTER — Ambulatory Visit (INDEPENDENT_AMBULATORY_CARE_PROVIDER_SITE_OTHER): Payer: Medicare Other | Admitting: Pharmacist Clinician (PhC)/ Clinical Pharmacy Specialist

## 2017-08-10 DIAGNOSIS — I639 Cerebral infarction, unspecified: Secondary | ICD-10-CM | POA: Diagnosis not present

## 2017-08-10 DIAGNOSIS — I359 Nonrheumatic aortic valve disorder, unspecified: Secondary | ICD-10-CM

## 2017-08-10 DIAGNOSIS — Z7901 Long term (current) use of anticoagulants: Secondary | ICD-10-CM

## 2017-08-15 ENCOUNTER — Other Ambulatory Visit: Payer: Self-pay | Admitting: Internal Medicine

## 2017-08-17 ENCOUNTER — Other Ambulatory Visit: Payer: Self-pay | Admitting: Cardiology

## 2017-08-18 ENCOUNTER — Other Ambulatory Visit: Payer: Self-pay | Admitting: Cardiology

## 2017-08-19 ENCOUNTER — Other Ambulatory Visit: Payer: Self-pay | Admitting: Pharmacist

## 2017-08-19 MED ORDER — WARFARIN SODIUM 2 MG PO TABS: ORAL_TABLET | ORAL | 0 refills | Status: DC

## 2017-08-24 LAB — POCT INR: INR: 3.2 — AB (ref 2.0–3.0)

## 2017-08-26 ENCOUNTER — Ambulatory Visit (INDEPENDENT_AMBULATORY_CARE_PROVIDER_SITE_OTHER): Payer: Medicare Other | Admitting: Pharmacist

## 2017-08-26 DIAGNOSIS — Z7901 Long term (current) use of anticoagulants: Secondary | ICD-10-CM

## 2017-08-26 DIAGNOSIS — I359 Nonrheumatic aortic valve disorder, unspecified: Secondary | ICD-10-CM

## 2017-09-19 LAB — POCT INR: INR: 3.2 — AB (ref 2.0–3.0)

## 2017-09-21 ENCOUNTER — Ambulatory Visit (INDEPENDENT_AMBULATORY_CARE_PROVIDER_SITE_OTHER): Payer: Medicare Other | Admitting: Pharmacist Clinician (PhC)/ Clinical Pharmacy Specialist

## 2017-09-21 DIAGNOSIS — Z7901 Long term (current) use of anticoagulants: Secondary | ICD-10-CM | POA: Diagnosis not present

## 2017-09-21 DIAGNOSIS — I359 Nonrheumatic aortic valve disorder, unspecified: Secondary | ICD-10-CM

## 2017-09-22 ENCOUNTER — Other Ambulatory Visit: Payer: Self-pay | Admitting: Internal Medicine

## 2017-10-12 LAB — POCT INR: INR: 3.2 — AB (ref 2.0–3.0)

## 2017-10-13 ENCOUNTER — Ambulatory Visit (INDEPENDENT_AMBULATORY_CARE_PROVIDER_SITE_OTHER): Payer: Medicare Other | Admitting: Pharmacist

## 2017-10-13 DIAGNOSIS — I359 Nonrheumatic aortic valve disorder, unspecified: Secondary | ICD-10-CM

## 2017-10-13 DIAGNOSIS — Z7901 Long term (current) use of anticoagulants: Secondary | ICD-10-CM | POA: Diagnosis not present

## 2017-10-20 ENCOUNTER — Emergency Department: Payer: Medicare Other

## 2017-10-20 ENCOUNTER — Encounter: Payer: Self-pay | Admitting: Emergency Medicine

## 2017-10-20 ENCOUNTER — Other Ambulatory Visit: Payer: Self-pay

## 2017-10-20 ENCOUNTER — Emergency Department
Admission: EM | Admit: 2017-10-20 | Discharge: 2017-10-20 | Disposition: A | Discharge status: Other Acute Inpatient Hospital | Payer: Medicare Other | Attending: Emergency Medicine | Admitting: Emergency Medicine

## 2017-10-20 DIAGNOSIS — Z952 Presence of prosthetic heart valve: Secondary | ICD-10-CM | POA: Insufficient documentation

## 2017-10-20 DIAGNOSIS — Q231 Congenital insufficiency of aortic valve: Secondary | ICD-10-CM | POA: Insufficient documentation

## 2017-10-20 DIAGNOSIS — Z8673 Personal history of transient ischemic attack (TIA), and cerebral infarction without residual deficits: Secondary | ICD-10-CM | POA: Diagnosis not present

## 2017-10-20 DIAGNOSIS — Z7901 Long term (current) use of anticoagulants: Secondary | ICD-10-CM | POA: Insufficient documentation

## 2017-10-20 DIAGNOSIS — I7101 Dissection of thoracic aorta: Secondary | ICD-10-CM | POA: Insufficient documentation

## 2017-10-20 DIAGNOSIS — Z7982 Long term (current) use of aspirin: Secondary | ICD-10-CM | POA: Insufficient documentation

## 2017-10-20 DIAGNOSIS — I10 Essential (primary) hypertension: Secondary | ICD-10-CM | POA: Diagnosis not present

## 2017-10-20 DIAGNOSIS — J449 Chronic obstructive pulmonary disease, unspecified: Secondary | ICD-10-CM | POA: Insufficient documentation

## 2017-10-20 DIAGNOSIS — Z79899 Other long term (current) drug therapy: Secondary | ICD-10-CM | POA: Diagnosis not present

## 2017-10-20 DIAGNOSIS — R079 Chest pain, unspecified: Secondary | ICD-10-CM | POA: Diagnosis present

## 2017-10-20 DIAGNOSIS — I71019 Dissection of thoracic aorta, unspecified: Secondary | ICD-10-CM

## 2017-10-20 LAB — CBC WITH DIFFERENTIAL/PLATELET
Basophils Absolute: 0 10*3/uL (ref 0–0.1)
Basophils Relative: 1 %
Eosinophils Absolute: 0.4 10*3/uL (ref 0–0.7)
Eosinophils Relative: 6 %
HCT: 38 % — ABNORMAL LOW (ref 40.0–52.0)
Hemoglobin: 13.6 g/dL (ref 13.0–18.0)
Lymphocytes Relative: 41 %
Lymphs Abs: 2.4 10*3/uL (ref 1.0–3.6)
MCH: 32.2 pg (ref 26.0–34.0)
MCHC: 35.8 g/dL (ref 32.0–36.0)
MCV: 90 fL (ref 80.0–100.0)
Monocytes Absolute: 0.6 10*3/uL (ref 0.2–1.0)
Monocytes Relative: 10 %
Neutro Abs: 2.5 10*3/uL (ref 1.4–6.5)
Neutrophils Relative %: 42 %
Platelets: 170 10*3/uL (ref 150–440)
RBC: 4.22 MIL/uL — ABNORMAL LOW (ref 4.40–5.90)
RDW: 13.9 % (ref 11.5–14.5)
WBC: 5.9 10*3/uL (ref 3.8–10.6)

## 2017-10-20 LAB — TROPONIN I: Troponin I: <0.03 ng/mL (ref <0.03)

## 2017-10-20 LAB — COMPREHENSIVE METABOLIC PANEL
ALT: 21 U/L (ref 0–44)
AST: 30 U/L (ref 15–41)
Albumin: 4.1 g/dL (ref 3.5–5.0)
Alkaline Phosphatase: 64 U/L (ref 38–126)
Anion gap: 7 (ref 5–15)
BUN: 17 mg/dL (ref 8–23)
CO2: 27 mmol/L (ref 22–32)
Calcium: 8.9 mg/dL (ref 8.9–10.3)
Chloride: 103 mmol/L (ref 98–111)
Creatinine, Ser: 1.25 mg/dL — ABNORMAL HIGH (ref 0.61–1.24)
GFR calc Af Amer: >60 mL/min (ref >60)
GFR calc non Af Amer: 57 mL/min — ABNORMAL LOW (ref >60)
Glucose, Bld: 144 mg/dL — ABNORMAL HIGH (ref 70–99)
Potassium: 3.9 mmol/L (ref 3.5–5.1)
Sodium: 137 mmol/L (ref 135–145)
Total Bilirubin: 0.7 mg/dL (ref 0.3–1.2)
Total Protein: 7.2 g/dL (ref 6.5–8.1)

## 2017-10-20 LAB — PROTIME-INR
INR: 2.39
Prothrombin Time: 25.9 seconds — ABNORMAL HIGH (ref 11.4–15.2)

## 2017-10-20 LAB — LACTIC ACID, PLASMA: Lactic Acid, Venous: 2.1 mmol/L (ref 0.5–1.9)

## 2017-10-20 LAB — LIPASE, BLOOD: Lipase: 30 U/L (ref 11–51)

## 2017-10-20 MED ORDER — HYDROCORTISONE NA SUCCINATE PF 100 MG IJ SOLR
200.0000 mg | Freq: Once | INTRAMUSCULAR | Status: AC
  Administered 2017-10-20: 200 mg via INTRAVENOUS
  Filled 2017-10-20: qty 4

## 2017-10-20 MED ORDER — SENNOSIDES-DOCUSATE SODIUM 8.6-50 MG PO TABS
2.00 | ORAL_TABLET | ORAL | Status: DC
Start: 2017-10-25 — End: 2017-10-20

## 2017-10-20 MED ORDER — ESMOLOL HCL-SODIUM CHLORIDE 2000 MG/100ML IV SOLN
25.00 | INTRAVENOUS | Status: DC
Start: ? — End: 2017-10-20

## 2017-10-20 MED ORDER — TAMSULOSIN HCL 0.4 MG PO CAPS
0.40 | ORAL_CAPSULE | ORAL | Status: DC
Start: 2017-10-21 — End: 2017-10-20

## 2017-10-20 MED ORDER — NICARDIPINE HCL IN NACL 40-0.83 MG/200ML-% IV SOLN
3.00 | INTRAVENOUS | Status: DC
Start: ? — End: 2017-10-20

## 2017-10-20 MED ORDER — GENERIC EXTERNAL MEDICATION
200.00 | Status: DC
Start: ? — End: 2017-10-20

## 2017-10-20 MED ORDER — METOPROLOL SUCCINATE ER 25 MG PO TB24
25.00 | ORAL_TABLET | ORAL | Status: DC
Start: 2017-10-21 — End: 2017-10-20

## 2017-10-20 MED ORDER — LABETALOL HCL 5 MG/ML IV SOLN
0.50 | INTRAVENOUS | Status: DC
Start: 2017-10-21 — End: 2017-10-20

## 2017-10-20 MED ORDER — FAMOTIDINE 20 MG/2ML IV SOLN
20.00 | INTRAVENOUS | Status: DC
Start: 2017-10-20 — End: 2017-10-20

## 2017-10-20 MED ORDER — PANTOPRAZOLE SODIUM 40 MG PO TBEC
40.00 | DELAYED_RELEASE_TABLET | ORAL | Status: DC
Start: 2017-10-26 — End: 2017-10-20

## 2017-10-20 MED ORDER — ACETAMINOPHEN 325 MG PO TABS
975.00 | ORAL_TABLET | ORAL | Status: DC
Start: ? — End: 2017-10-20

## 2017-10-20 MED ORDER — POLYETHYLENE GLYCOL 3350 17 G PO PACK
17.00 | PACK | ORAL | Status: DC
Start: 2017-10-26 — End: 2017-10-20

## 2017-10-20 MED ORDER — INSULIN REGULAR HUMAN 100 UNIT/ML IJ SOLN
0.00 | INTRAMUSCULAR | Status: DC
Start: 2017-10-23 — End: 2017-10-20

## 2017-10-20 MED ORDER — IOPAMIDOL (ISOVUE-370) INJECTION 76%
100.0000 mL | Freq: Once | INTRAVENOUS | Status: AC | PRN
  Administered 2017-10-20: 100 mL via INTRAVENOUS

## 2017-10-20 MED ORDER — DIPHENHYDRAMINE HCL 50 MG/ML IJ SOLN
50.0000 mg | Freq: Once | INTRAMUSCULAR | Status: AC
  Administered 2017-10-20: 50 mg via INTRAVENOUS
  Filled 2017-10-20: qty 1

## 2017-10-20 MED ORDER — PROTHROMBIN COMPLEX CONC HUMAN 500 UNITS IV KIT
2687.0000 [IU] | PACK | Freq: Once | Status: DC
  Filled 2017-10-20: qty 2687

## 2017-10-20 MED ORDER — HYDROCORTISONE NA SUCCINATE PF 100 MG IJ SOLR
INTRAMUSCULAR | Status: AC
  Filled 2017-10-20: qty 2

## 2017-10-20 MED ORDER — TIOTROPIUM BROMIDE MONOHYDRATE 18 MCG IN CAPS
18.00 | ORAL_CAPSULE | RESPIRATORY_TRACT | Status: DC
Start: 2017-10-26 — End: 2017-10-20

## 2017-10-20 MED ORDER — NICARDIPINE HCL IN NACL 20-0.86 MG/200ML-% IV SOLN
3.0000 mg/h | Freq: Once | INTRAVENOUS | Status: AC
  Administered 2017-10-20: 5 mg/h via INTRAVENOUS
  Filled 2017-10-20: qty 200

## 2017-10-20 MED ORDER — ASPIRIN 81 MG PO CHEW
81.00 | CHEWABLE_TABLET | ORAL | Status: DC
Start: 2017-10-26 — End: 2017-10-20

## 2017-10-20 MED ORDER — FENTANYL CITRATE (PF) 100 MCG/2ML IJ SOLN
100.0000 ug | Freq: Once | INTRAMUSCULAR | Status: AC
  Administered 2017-10-20: 100 ug via INTRAVENOUS
  Filled 2017-10-20: qty 2

## 2017-10-20 MED ORDER — ONDANSETRON HCL 4 MG/2ML IJ SOLN
4.00 | INTRAMUSCULAR | Status: DC
Start: ? — End: 2017-10-20

## 2017-10-20 MED ORDER — VITAMIN K1 10 MG/ML IJ SOLN
10.0000 mg | Freq: Once | INTRAVENOUS | Status: DC
  Filled 2017-10-20: qty 1

## 2017-10-20 MED ORDER — ESMOLOL HCL-SODIUM CHLORIDE 2000 MG/100ML IV SOLN
25.0000 ug/kg/min | Freq: Once | INTRAVENOUS | Status: AC
  Administered 2017-10-20: 25 ug/kg/min via INTRAVENOUS
  Filled 2017-10-20: qty 100

## 2017-10-20 MED ORDER — ROSUVASTATIN CALCIUM 10 MG PO TABS
10.00 | ORAL_TABLET | ORAL | Status: DC
Start: 2017-10-24 — End: 2017-10-20

## 2017-10-20 NOTE — ED Notes (Signed)
Pt resting quietly with wife at the bedside. Alert and answering questions appropriately. No increased work of breathing noted ate this time. Pt reports pain has decreased and he is resting more comfortably. Will continue to assess.

## 2017-10-20 NOTE — ED Notes (Signed)
MD at the bedside to discuss plan of care with pt. Pt and wife verbalize understanding.

## 2017-10-20 NOTE — ED Notes (Addendum)
Pt up to side of bed to use urinal. Tolerated well. Wife at the bedside attentive to pt needs.

## 2017-10-20 NOTE — ED Provider Notes (Signed)
[facility] Emergency Department Provider Note  ____________________________________________   First MD Initiated Contact with Patient 10/20/17 [phone]     (approximate)  I have reviewed the triage vital signs and the nursing notes.   HISTORY  Chief Complaint Chest Pain    HPI Alexander Barton is a 67 y.o. male who comes to the emergency department with sudden onset severe midsternal chest pain radiating straight to his back associated with abdominal pain and nausea that began 30 minutes prior to arrival.  He has a long-standing history of a "aortic aneurysm" as well as a "aortic ulcer".  He also has a mechanical valve and is anticoagulated with Coumadin.  His symptoms came on suddenly and were severe at onset.  They are now constant.  Nothing seems to make them better or worse.    Past Medical History:  Diagnosis Date  . Aortic aneurysm, thoracic ([place])   . Aortic valve disease   . Arthritis   . BPH (benign prostatic hyperplasia)   . COPD (chronic obstructive pulmonary disease) ([place])   . GERD (gastroesophageal reflux disease)   . HTN (hypertension)   . Hypercholesterolemia   . Stroke ([place])   . TIA (transient ischemic attack)     Patient Active Problem List   Diagnosis Date Noted  . Overweight 07/01/2017  . TIA (transient ischemic attack)   . Stroke ([place])   . GERD (gastroesophageal reflux disease)   . Arthritis   . Neck pain 12/01/2016  . Knee osteoarthritis 12/01/2016  . Physical deconditioning 06/24/2016  . Dyspnea on exertion 06/23/2016  . Chronic pain of right knee 12/18/2015  . Chronic obstructive pulmonary disease ([place]) 11/14/2014  . BPH (benign prostatic hyperplasia) 11/14/2014  . Preventative health care 11/14/2014  . Long term current use of anticoagulant therapy 10/29/2013  . Cerebral infarction ([place]) 06/02/2013  . H/O aortic valve replacement 12/26/2011  . History of aortic arch replacement 01/07/2011  . HTN (hypertension)   .  Hyperlipidemia   . Aortic aneurysm, thoracic ([place])   . Aortic valve disease     Past Surgical History:  Procedure Laterality Date  . ACHILLES TENDON REPAIR    . AORTIC VALVE REPLACEMENT     #23 On-X valve conduit  . ASCENDING AORTIC ANEURYSM REPAIR    . BACK SURGERY    . FOOT SURGERY      Prior to Admission medications   Medication Sig Start Date End Date Taking? Authorizing Provider  acidophilus (RISAQUAD) CAPS capsule Take 2 capsules by mouth daily.    [provider]  albuterol (PROVENTIL HFA;VENTOLIN HFA) 108 (90 Base) MCG/ACT inhaler Inhale 2 puffs into the lungs every 4 (four) hours as needed for wheezing or shortness of breath. 07/22/16   Flora Lipps, MD  Apoaequorin (PREVAGEN) 10 MG CAPS Take 10 mg by mouth. Reported on 08/17/2015    [provider]  aspirin 81 MG tablet Take 81 mg by mouth at bedtime.     [provider]  cholecalciferol (VITAMIN D) 1000 units tablet Take 1,000 Units by mouth daily.    [provider]  metoprolol succinate (TOPROL-XL) 50 MG 24 hr tablet TAKE 1 TABLET (50 MG TOTAL) BY MOUTH DAILY. TAKE WITH OR IMMEDIATELY FOLLOWING A MEAL. 07/09/17   Crecencio Mc, MD  Multiple Vitamin (MULTIVITAMIN WITH MINERALS) TABS tablet Take 1 tablet by mouth 2 (two) times daily.    [provider]  Nutritional Supplements (VITAMIN D PLUS COFACTORS PO) Take 1 capsule by mouth  every evening.    [provider]  omega-3 acid ethyl esters (LOVAZA) 1 g capsule Take 1 g by mouth every evening.    [provider]  SPIRIVA RESPIMAT 2.5 MCG/ACT AERS INHALE 2.5 MCG INTO THE LUNGS 2 (TWO) TIMES DAILY. 09/22/17   Flora Lipps, MD  SPIRULINA PO Take 3 capsules by mouth daily.    [provider]  SYMBICORT 160-4.5 MCG/ACT inhaler TAKE 2 PUFFS BY MOUTH TWICE A DAY 08/17/17   Flora Lipps, MD  tamsulosin (FLOMAX) 0.4 MG CAPS capsule TAKE 2 CAPSULES BY MOUTH EVERY DAY 04/27/17   Leone Haven, MD  warfarin (COUMADIN)  2 MG tablet TAKE 1 TABLET WITH 5 MG TABLET DAILY AS DIRECTED BY COUMADIN CLINIC 08/19/17   [name], [name] M, MD  warfarin (COUMADIN) 5 MG tablet Take 5-10 mg by mouth at bedtime. 10mg  on Monday, Wednesday, Friday. Take with 2mg  tab for a total 7mg  on all other days.    [provider]  warfarin (COUMADIN) 5 MG tablet TAKE 1-2 TABLETS BY MOUTH DAILY WITH 2 MG TABLET AS DIRECTED BY COUMADIN CLINIC 05/04/17   [name], [name] M, MD  warfarin (COUMADIN) 5 MG tablet TAKE 1 TO 2 TABLETS DAILY AS DIRECTED BY COUMADIN CLINIC 08/18/17   [name], [name] M, MD    Allergies Codeine; Gadolinium derivatives; Hydrocodone; Iodinated diagnostic agents; Statins; Hydromorphone; Lipitor [atorvastatin calcium]; Other; and Seroquel [quetiapine fumerate]  Family History  Problem Relation Age of Onset  . Alzheimer's disease Father   . Diabetes Father        age onset DM  . Hypertension Sister   . Hypertension Brother   . Diabetes Mother   . Diabetes Sister   . Colon cancer Neg Hx     Social History Social History   Tobacco Use  . Smoking status: Never Smoker  . Smokeless tobacco: Never Used  Substance Use Topics  . Alcohol use: No    Alcohol/week: 0.0 standard drinks  . Drug use: No    Review of Systems Constitutional: No fever/chills Eyes: No visual changes. ENT: No sore throat. Cardiovascular: Positive for chest pain. Respiratory: Denies shortness of breath. Gastrointestinal: Positive for abdominal pain.  Positive for nausea, no vomiting.  No diarrhea.  No constipation. Genitourinary: Negative for dysuria. Musculoskeletal: Negative for back pain. Skin: Negative for rash. Neurological: Negative for headaches, focal weakness or numbness.   ____________________________________________   PHYSICAL EXAM:  VITAL SIGNS: ED Triage Vitals [10/20/17 0202]  Enc Vitals Group     BP      Pulse      Resp      Temp      Temp src      SpO2      Weight 220 lb (99.8 kg)     Height 6\' 2"  (1.88 m)      Head Circumference      Peak Flow      Pain Score 10     Pain Loc      Pain Edu?      Excl. in [place]?     Constitutional: Alert and oriented x4 appears extremely uncomfortable fidgeting in bed holding his chest with mild diaphoresis Eyes: PERRL EOMI. Head: Atraumatic. Nose: No congestion/rhinnorhea. Mouth/Throat: No trismus Neck: No stridor.   Cardiovascular: Normal rate, regular rhythm. Grossly normal heart sounds.  Good peripheral circulation. Respiratory: Increased respiratory effort.  No retractions. Lungs CTAB and moving good air Gastrointestinal: Soft abdomen somewhat distended exquisitely tender diffusely with no frank peritonitis  Musculoskeletal: No lower extremity edema   Neurologic:  Normal speech and language. No gross focal neurologic deficits are appreciated. Skin: Mildly diaphoretic Psychiatric: Mood and affect are normal. Speech and behavior are normal.    ____________________________________________   DIFFERENTIAL includes but not limited to  Aortic dissection, mesenteric ischemia, Boerhaave syndrome, pneumonia, pneumothorax, acute coronary syndrome ____________________________________________   LABS (all labs ordered are listed, but only abnormal results are displayed)  Labs Reviewed  COMPREHENSIVE METABOLIC PANEL - Abnormal; Notable for the following components:      Result Value   Glucose, Bld 144 (*)    Creatinine, Ser 1.25 (*)    GFR calc non Af Amer 57 (*)    All other components within normal limits  CBC WITH DIFFERENTIAL/PLATELET - Abnormal; Notable for the following components:   RBC 4.22 (*)    HCT 38.0 (*)    All other components within normal limits  LACTIC ACID, PLASMA - Abnormal; Notable for the following components:   Lactic Acid, Venous 2.1 (*)    All other components within normal limits  PROTIME-INR - Abnormal; Notable for the following components:   Prothrombin Time 25.9 (*)    All other components within normal limits  LIPASE,  BLOOD  TROPONIN I  LACTIC ACID, PLASMA    Lab work reviewed by me with elevated lactic acid which in this setting is concerning for hypoperfusion and not sepsis __________________________________________  EKG  ED ECG REPORT I, Darel Hong, the attending physician, personally viewed and interpreted this ECG.  Date: 10/20/2017 EKG Time:  Rate: 57 Rhythm: Sinus bradycardia QRS Axis: normal Intervals: normal ST/T Wave abnormalities: normal Narrative Interpretation: no evidence of acute ischemia  ____________________________________________  RADIOLOGY  Chest x-ray reviewed by me with no acute disease CT angios of the chest reviewed by me consistent with acute aortic dissection from mid arch down to just above the renal arteries ____________________________________________   PROCEDURES  Procedure(s) performed: no  .Critical Care Performed by: Darel Hong, MD Authorized by: Darel Hong, MD   Critical care provider statement:    Critical care time (minutes):  65   Critical care time was exclusive of:  Separately billable procedures and treating other patients   Critical care was necessary to treat or prevent imminent or life-threatening deterioration of the following conditions:  Circulatory failure   Critical care was time spent personally by me on the following activities:  Development of treatment plan with patient or surrogate, discussions with consultants, evaluation of patient's response to treatment, examination of patient, obtaining history from patient or surrogate, ordering and performing treatments and interventions, ordering and review of laboratory studies, ordering and review of radiographic studies, pulse oximetry, re-evaluation of patient's condition and review of old charts    Critical Care performed: Yes  ____________________________________________   INITIAL IMPRESSION / ASSESSMENT AND PLAN / ED COURSE  Pertinent labs & imaging results that  were available during my care of the patient were reviewed by me and considered in my medical decision making (see chart for details).   As part of my medical decision making, I reviewed the following data within the [place] History obtained from family if available, nursing notes, old chart and ekg, as well as notes from prior ED visits.  The patient arrives quite uncomfortable appearing with sudden onset maximal onset chest pain radiating to his back with a known history of aortic ulcer.  I have a high clinical suspicion for aortic dissection.  I appreciate that he has  a reported "allergy" to IV contrast however on questioning his allergy is uncontrolled sneezing.  He has never had throat swelling and never had a rash.  We will begin with pretreatment with Benadryl and hydrocortisone and I will reach out to radiology regarding CT prior to the labs and prior to waiting.      __----------------------------------------- 2:34 AM on 10/20/2017 -----------------------------------------  I spoke with Dr. Gerilyn Nestle on-call radiologist who agrees the patient's clinical condition warrants going to CT scan now before labs and that sneezing is likely not a true allergic reaction and he can go straight over.  We will go ahead and pretreat him with steroids and Benadryl anyway.  __________________________________________  ----------------------------------------- 3:14 AM on 10/20/2017 -----------------------------------------  By my read the patient has a type a and type B aortic dissection.  I spoke with on-call vascular surgeon Dr. do who recommends transfer to a tertiary center as we are unable to care for type a dissections.  I have a call out to [facility] now and we are pending acceptance.  Nicardipine drip has already been hung and esmolol drip is coming as his heart rate is 70.  The patient is also anticoagulated with Coumadin for mechanical valve and I understand reversing this is very  high risk for stroke however given his acute vascular emergency we will give Kcentra and vitamin K.   ----------------------------------------- 3:27 AM on 10/20/2017 -----------------------------------------  I spoke with Dr. Ysidro Evert the patient's thoracic surgeon at [facility] who has graciously agreed to accept the patient as a transfer.  He actually has asked me to hold his Coumadin but not reverse it.  He is asked for systolic blood pressure less than 120 and to beta-blocker down to a heart rate of 60.  FINAL CLINICAL IMPRESSION(S) / ED DIAGNOSES  Final diagnoses:  Acute thoracic aortic dissection (HCC)  Hypertension, unspecified type      NEW MEDICATIONS STARTED DURING THIS VISIT:  New Prescriptions   No medications on file     Note:  This document was prepared using Dragon voice recognition software and may include unintentional dictation errors.     Darel Hong, MD 10/20/17 [phone]

## 2017-10-20 NOTE — ED Notes (Signed)
[facility] life flight at the bedside to prepare pt for transport.

## 2017-10-20 NOTE — ED Notes (Signed)
Pt resting quietly awaiting transport. Eyes closed and even respirations. Pt c/o slight increase in chest pressure. MD aware.

## 2017-10-20 NOTE — ED Triage Notes (Signed)
Patient ambulatory to triage with steady gait, without difficulty or distress noted; pt reports midsternal CP radiating into back accomp by indigestion x 83min

## 2017-10-20 NOTE — ED Notes (Signed)
Pt in CT. States he is feeling short of breath and has an increase in the chest pressure. 2L O2 provided via nasal cannula.

## 2017-10-20 NOTE — ED Notes (Signed)
EMTALA checked for completion  

## 2017-10-21 MED ORDER — METOPROLOL SUCCINATE ER 50 MG PO TB24
50.00 | ORAL_TABLET | ORAL | Status: DC
Start: 2017-10-26 — End: 2017-10-21

## 2017-10-21 MED ORDER — TAMSULOSIN HCL 0.4 MG PO CAPS
0.80 | ORAL_CAPSULE | ORAL | Status: DC
Start: 2017-10-26 — End: 2017-10-21

## 2017-10-22 MED ORDER — GENERIC EXTERNAL MEDICATION
50.00 | Status: DC
Start: 2017-10-22 — End: 2017-10-22

## 2017-10-23 MED ORDER — GENERIC EXTERNAL MEDICATION
7.00 | Status: DC
Start: 2017-10-25 — End: 2017-10-23

## 2017-10-25 LAB — POCT INR: INR: 1.7 — AB (ref 2.0–3.0)

## 2017-10-25 MED ORDER — AMLODIPINE BESYLATE 5 MG PO TABS
5.00 | ORAL_TABLET | ORAL | Status: DC
Start: 2017-10-26 — End: 2017-10-25

## 2017-10-25 MED ORDER — TRAMADOL HCL 50 MG PO TABS
50.00 | ORAL_TABLET | ORAL | Status: DC
Start: ? — End: 2017-10-25

## 2017-10-26 ENCOUNTER — Ambulatory Visit (INDEPENDENT_AMBULATORY_CARE_PROVIDER_SITE_OTHER): Payer: Medicare Other | Admitting: Pharmacist Clinician (PhC)/ Clinical Pharmacy Specialist

## 2017-10-26 DIAGNOSIS — I359 Nonrheumatic aortic valve disorder, unspecified: Secondary | ICD-10-CM

## 2017-10-26 DIAGNOSIS — Z7901 Long term (current) use of anticoagulants: Secondary | ICD-10-CM

## 2017-10-28 ENCOUNTER — Ambulatory Visit (INDEPENDENT_AMBULATORY_CARE_PROVIDER_SITE_OTHER): Payer: Medicare Other | Admitting: Pharmacist

## 2017-10-28 DIAGNOSIS — I359 Nonrheumatic aortic valve disorder, unspecified: Secondary | ICD-10-CM

## 2017-10-28 DIAGNOSIS — Z7901 Long term (current) use of anticoagulants: Secondary | ICD-10-CM | POA: Diagnosis not present

## 2017-10-28 LAB — POCT INR: INR: 2.1 (ref 2.0–3.0)

## 2017-10-30 ENCOUNTER — Telehealth: Payer: Self-pay

## 2017-10-30 NOTE — Telephone Encounter (Signed)
Sent to PCP to advise 

## 2017-10-30 NOTE — Telephone Encounter (Signed)
Copied from [place] [id number]. Topic: Appointment Scheduling - Prior Auth Required for Appointment >> Oct 30, 2017 11:27 AM [name] wrote: Patient went to [facility] on 9/17 and was transferred to [facility] and was discharged on Sunday 9/22 and patient was advised to follow up with Dr [name]. Can Patient be worked in? He was given Tramadol for pain and only has enough for two weeks.

## 2017-10-30 NOTE — Telephone Encounter (Signed)
Sent to [place] I am unable to schedule pt for same day appt could you please schedule pt thanks.

## 2017-10-30 NOTE — Telephone Encounter (Signed)
He can be placed in at 430 on a Monday, Wednesday, or Friday.

## 2017-10-30 NOTE — Telephone Encounter (Signed)
Copied from [employer] [id number]. Topic: Appointment Scheduling - Prior Auth Required for Appointment >> Oct 30, 2017 11:27 AM [name] wrote: Patient went to [facility] on 9/17 and was transferred to [facility] and was discharged on Sunday 9/22 and patient was advised to follow up with Dr [name]. Can Patient be worked in? He was given Tramadol for pain and only has enough for two weeks.

## 2017-10-30 NOTE — Telephone Encounter (Signed)
Duplicated message.

## 2017-11-02 ENCOUNTER — Telehealth: Payer: Self-pay

## 2017-11-02 NOTE — Telephone Encounter (Signed)
Please find out which day patient can come and I will be glad to place on schedule.

## 2017-11-02 NOTE — Telephone Encounter (Signed)
Called pt has already been scheduled for an appt on 10/4. Ok to close out message.

## 2017-11-02 NOTE — Telephone Encounter (Signed)
Copied from [place] [id number]. Topic: Appointment Scheduling - Prior Auth Required for Appointment >> Oct 30, 2017 11:27 AM [name] wrote: Patient went to [facility] on 9/17 and was transferred to [facility] and was discharged on Sunday 9/22 and patient was advised to follow up with Dr [name]. Can Patient be worked in? He was given Tramadol for pain and only has enough for two weeks.  Patient scheduled to see Dr. [name] on 10-07-17 at 8:30.

## 2017-11-04 ENCOUNTER — Ambulatory Visit (INDEPENDENT_AMBULATORY_CARE_PROVIDER_SITE_OTHER): Payer: Medicare Other | Admitting: Pharmacist Clinician (PhC)/ Clinical Pharmacy Specialist

## 2017-11-04 DIAGNOSIS — Z7901 Long term (current) use of anticoagulants: Secondary | ICD-10-CM

## 2017-11-04 DIAGNOSIS — Z952 Presence of prosthetic heart valve: Secondary | ICD-10-CM

## 2017-11-04 DIAGNOSIS — I359 Nonrheumatic aortic valve disorder, unspecified: Secondary | ICD-10-CM

## 2017-11-04 LAB — POCT INR: INR: 2.9 (ref 2.0–3.0)

## 2017-11-06 ENCOUNTER — Encounter: Payer: Self-pay | Admitting: Family Medicine

## 2017-11-06 ENCOUNTER — Ambulatory Visit: Payer: Medicare Other | Admitting: Family Medicine

## 2017-11-06 ENCOUNTER — Other Ambulatory Visit: Payer: Self-pay | Admitting: Family Medicine

## 2017-11-06 VITALS — BP 104/68 | HR 77 | Temp 98.3°F | Ht 74.0 in | Wt 216.0 lb

## 2017-11-06 DIAGNOSIS — I71012 Dissection of descending thoracic aorta: Secondary | ICD-10-CM

## 2017-11-06 DIAGNOSIS — E871 Hypo-osmolality and hyponatremia: Secondary | ICD-10-CM | POA: Diagnosis not present

## 2017-11-06 DIAGNOSIS — D649 Anemia, unspecified: Secondary | ICD-10-CM

## 2017-11-06 DIAGNOSIS — I7101 Dissection of thoracic aorta: Secondary | ICD-10-CM | POA: Diagnosis not present

## 2017-11-06 LAB — CBC
HCT: 35.8 % — ABNORMAL LOW (ref 39.0–52.0)
Hemoglobin: 12.4 g/dL — ABNORMAL LOW (ref 13.0–17.0)
MCHC: 34.7 g/dL (ref 30.0–36.0)
MCV: 90.5 fl (ref 78.0–100.0)
Platelets: 343 10*3/uL (ref 150.0–400.0)
RBC: 3.96 Mil/uL — ABNORMAL LOW (ref 4.22–5.81)
RDW: 13.5 % (ref 11.5–15.5)
WBC: 7.2 10*3/uL (ref 4.0–10.5)

## 2017-11-06 LAB — BASIC METABOLIC PANEL
BUN: 20 mg/dL (ref 6–23)
CO2: 28 mEq/L (ref 19–32)
Calcium: 9.6 mg/dL (ref 8.4–10.5)
Chloride: 100 mEq/L (ref 96–112)
Creatinine, Ser: 1.33 mg/dL (ref 0.40–1.50)
GFR: 56.64 mL/min — ABNORMAL LOW (ref >60.00)
Glucose, Bld: 135 mg/dL — ABNORMAL HIGH (ref 70–99)
Potassium: 4.3 mEq/L (ref 3.5–5.1)
Sodium: 134 mEq/L — ABNORMAL LOW (ref 135–145)

## 2017-11-06 MED ORDER — TRAMADOL HCL 50 MG PO TABS: 50.0000 mg | ORAL_TABLET | ORAL | 0 refills | Status: DC | PRN

## 2017-11-06 NOTE — Patient Instructions (Signed)
Nice to see you. I am glad you are doing better. Please continue to follow with cardiology for your INR.  Please keep your follow-up with your surgeon.  If you develop any recurrence of chest pain you need to go to call [phone] immediately.  If you start to have to take the tramadol again you need to be evaluated.

## 2017-11-08 DIAGNOSIS — I7101 Dissection of thoracic aorta: Secondary | ICD-10-CM | POA: Insufficient documentation

## 2017-11-08 DIAGNOSIS — E871 Hypo-osmolality and hyponatremia: Secondary | ICD-10-CM | POA: Insufficient documentation

## 2017-11-08 DIAGNOSIS — I7123 Aneurysm of the descending thoracic aorta, without rupture: Secondary | ICD-10-CM | POA: Insufficient documentation

## 2017-11-08 DIAGNOSIS — D649 Anemia, unspecified: Secondary | ICD-10-CM | POA: Insufficient documentation

## 2017-11-08 NOTE — Assessment & Plan Note (Signed)
Mild.  Noted on lab work.  Plan to recheck.

## 2017-11-08 NOTE — Assessment & Plan Note (Addendum)
Patient seems to have done quite well since discharge.  His pain has progressively improved and he has not required any pain medication over the past week.  He would like a refill of his tramadol to have just in case he develops discomfort.  I discussed that if he develops any significant discomfort he needs to be evaluated.  I will provide him with a small refill of the tramadol though he does still have some at home.  He feels well overall.  His blood pressure has been adequately controlled.  He is following with cardiology for his INR.  He will follow-up with his surgeon later this month.  He will schedule an appointment with his cardiologist for follow-up as well.  He is given return precautions.

## 2017-11-08 NOTE — Progress Notes (Signed)
[name], MD Phone: [phone]  Alexander Barton is a 67 y.o. male who presents today for f/u.  CC: hospital follow-up aortic dissection  Patient presents for hospital follow-up for Stanford type B aortic dissection.  He was hospitalized from 10/20/2017-10/25/2017.  He presented to the emergency department at [facility] following sudden onset centralized chest discomfort that ended up radiating through to his back.  He had developed some lightheadedness and felt as though he initially had indigestion though when it did not resolve with home remedies he felt he needed to go to the emergency room.  CT angiogram revealed an aortic dissection beginning in the mid aortic arch region between the left common carotid artery and left subclavian artery origin and extending through the descending aorta.  The dissection flap extended in to the origin of the left subclavian artery and in to the origin of the superior mesenteric artery.  Flow is demonstrated in both the true and false lumens.  The celiac axis appears to arise from the false lumen.  The dissection was new.  He was subsequently transferred to [facility] and treated medically.  He received anti-impulse therapy.  He had a repeat CT angiogram on 9/19 that was stable.  Genetics was consulted as well.  The patient had apparently had multiple TIAs over the last year and it was reported that his PCP had altered his INR goal, though I do not manage his coumadin or INR testing. It appears cardiology has been treating to a goal of 2.5-3 recently. The inpatient team recommended an INR goal of 2-2.5.  He follows with cardiology for his INR's.  The patient reports while in the hospital he had recurrence of his chest pain and reports that the attending surgeon advised that oftentimes people will have residual pain that would improve by week two after the event.  The patient reports his pain has improved significantly.  He reports only occasional light  discomfort when sitting.  He was discharged on tramadol to help with the pain and he has not taken any in the last week.  He is no longer taking Tylenol.  He notes no shortness of breath.  He notes no bleeding issues.  His blood pressures range 93-127/52-76.  The inpatient team treated him to a goal of less than 135/85 and greater than 90/50.  The patient is to contact them if he goes out of those ranges on 2 or more occasions.  Noted to be minimally hyponatremic and anemic on labs from the hospital.  Discharge summary reviewed.  Social History   Tobacco Use  Smoking Status Never Smoker  Smokeless Tobacco Never Used     ROS see history of present illness  Objective  Physical Exam Vitals:   11/06/17 0831  BP: 104/68  Pulse: 77  Temp: 98.3 F (36.8 C)  SpO2: 98%    BP Readings from Last 3 Encounters:  11/06/17 104/68  10/20/17 114/63  07/01/17 112/68   Wt Readings from Last 3 Encounters:  11/06/17 216 lb (98 kg)  10/20/17 220 lb (99.8 kg)  07/01/17 223 lb 6.4 oz (101.3 kg)    Physical Exam  Constitutional: No distress.  Cardiovascular: Normal rate and regular rhythm.  Valve replacement sound heard  Pulmonary/Chest: Effort normal and breath sounds normal.  Musculoskeletal: He exhibits no edema.  Neurological: He is alert.  Skin: Skin is warm and dry. He is not diaphoretic.     Assessment/Plan: Please see individual problem list.  Dissecting aneurysm of thoracic aorta,  Stanford type B [facility]) Patient seems to have done quite well since discharge.  His pain has progressively improved and he has not required any pain medication over the past week.  He would like a refill of his tramadol to have just in case he develops discomfort.  I discussed that if he develops any significant discomfort he needs to be evaluated.  I will provide him with a small refill of the tramadol though he does still have some at home.  He feels well overall.  His blood pressure has been adequately  controlled.  He is following with cardiology for his INR.  He will follow-up with his surgeon later this month.  He will schedule an appointment with his cardiologist for follow-up as well.  He is given return precautions.  Anemia Mild.  Noted on lab work.  Plan to recheck.  Hyponatremia Mild.  Noted on lab work.  Plan to recheck.   Orders Placed This Encounter  Procedures  . CBC  . Basic Metabolic Panel (BMET)    Meds ordered this encounter  Medications  . traMADol (ULTRAM) 50 MG tablet    Sig: Take 1 tablet (50 mg total) by mouth every 4 (four) hours as needed. for pain    Dispense:  20 tablet    Refill:  0     [name], MD [place]

## 2017-11-11 ENCOUNTER — Ambulatory Visit (INDEPENDENT_AMBULATORY_CARE_PROVIDER_SITE_OTHER): Payer: Medicare Other | Admitting: Pharmacist Clinician (PhC)/ Clinical Pharmacy Specialist

## 2017-11-11 DIAGNOSIS — Z952 Presence of prosthetic heart valve: Secondary | ICD-10-CM

## 2017-11-11 DIAGNOSIS — Z7901 Long term (current) use of anticoagulants: Secondary | ICD-10-CM

## 2017-11-11 DIAGNOSIS — I639 Cerebral infarction, unspecified: Secondary | ICD-10-CM

## 2017-11-11 DIAGNOSIS — I359 Nonrheumatic aortic valve disorder, unspecified: Secondary | ICD-10-CM

## 2017-11-11 LAB — POCT INR: INR: 2.3 (ref 2.0–3.0)

## 2017-11-15 ENCOUNTER — Other Ambulatory Visit: Payer: Self-pay | Admitting: Family Medicine

## 2017-11-15 ENCOUNTER — Other Ambulatory Visit: Payer: Self-pay | Admitting: Cardiology

## 2017-11-16 ENCOUNTER — Other Ambulatory Visit (INDEPENDENT_AMBULATORY_CARE_PROVIDER_SITE_OTHER): Payer: Medicare Other

## 2017-11-16 DIAGNOSIS — D649 Anemia, unspecified: Secondary | ICD-10-CM | POA: Diagnosis not present

## 2017-11-16 DIAGNOSIS — E871 Hypo-osmolality and hyponatremia: Secondary | ICD-10-CM

## 2017-11-16 LAB — CBC
HCT: 36.4 % — ABNORMAL LOW (ref 39.0–52.0)
Hemoglobin: 12.5 g/dL — ABNORMAL LOW (ref 13.0–17.0)
MCHC: 34.4 g/dL (ref 30.0–36.0)
MCV: 90.2 fl (ref 78.0–100.0)
Platelets: 230 10*3/uL (ref 150.0–400.0)
RBC: 4.04 Mil/uL — ABNORMAL LOW (ref 4.22–5.81)
RDW: 13.6 % (ref 11.5–15.5)
WBC: 5.2 10*3/uL (ref 4.0–10.5)

## 2017-11-16 LAB — BASIC METABOLIC PANEL
BUN: 15 mg/dL (ref 6–23)
CO2: 28 mEq/L (ref 19–32)
Calcium: 9.5 mg/dL (ref 8.4–10.5)
Chloride: 103 mEq/L (ref 96–112)
Creatinine, Ser: 1.21 mg/dL (ref 0.40–1.50)
GFR: 63.17 mL/min (ref >60.00)
Glucose, Bld: 138 mg/dL — ABNORMAL HIGH (ref 70–99)
Potassium: 4.4 mEq/L (ref 3.5–5.1)
Sodium: 137 mEq/L (ref 135–145)

## 2017-11-17 ENCOUNTER — Telehealth: Payer: Self-pay | Admitting: Family Medicine

## 2017-11-17 NOTE — Telephone Encounter (Signed)
Called pt today and went over lab results. Pt advised and voiced understanding.

## 2017-11-17 NOTE — Telephone Encounter (Signed)
Copied from [place]. Topic: Quick Communication - See Telephone Encounter >> Nov 17, 2017  2:34 PM Vernona Rieger wrote: CRM for notification. See Telephone encounter for: 11/17/17.  Patient is requesting his lab results from 10/14

## 2017-11-18 ENCOUNTER — Other Ambulatory Visit: Payer: Self-pay | Admitting: Cardiology

## 2017-11-18 ENCOUNTER — Ambulatory Visit (INDEPENDENT_AMBULATORY_CARE_PROVIDER_SITE_OTHER): Payer: Medicare Other | Admitting: Pharmacist

## 2017-11-18 DIAGNOSIS — I359 Nonrheumatic aortic valve disorder, unspecified: Secondary | ICD-10-CM

## 2017-11-18 DIAGNOSIS — Z7901 Long term (current) use of anticoagulants: Secondary | ICD-10-CM | POA: Diagnosis not present

## 2017-11-18 LAB — POCT INR: INR: 2.4 (ref 2.0–3.0)

## 2017-11-25 ENCOUNTER — Ambulatory Visit (INDEPENDENT_AMBULATORY_CARE_PROVIDER_SITE_OTHER): Payer: Medicare Other | Admitting: Pharmacist

## 2017-11-25 DIAGNOSIS — Z7901 Long term (current) use of anticoagulants: Secondary | ICD-10-CM | POA: Diagnosis not present

## 2017-11-25 DIAGNOSIS — I359 Nonrheumatic aortic valve disorder, unspecified: Secondary | ICD-10-CM

## 2017-11-25 LAB — POCT INR: INR: 2.6 (ref 2.0–3.0)

## 2017-11-30 ENCOUNTER — Telehealth: Payer: Self-pay | Admitting: Cardiology

## 2017-11-30 NOTE — Telephone Encounter (Signed)
Received records from [place] on 11/27/17, Appt on 02/04/17 @ 8:20AM.[place]

## 2017-11-30 NOTE — Telephone Encounter (Signed)
Received records from [place] on 11/27/17, Appt 12/05/17 @ 8:20AM.[place]

## 2017-12-02 ENCOUNTER — Ambulatory Visit (INDEPENDENT_AMBULATORY_CARE_PROVIDER_SITE_OTHER): Payer: Medicare Other | Admitting: Pharmacist Clinician (PhC)/ Clinical Pharmacy Specialist

## 2017-12-02 DIAGNOSIS — I359 Nonrheumatic aortic valve disorder, unspecified: Secondary | ICD-10-CM

## 2017-12-02 DIAGNOSIS — Z7901 Long term (current) use of anticoagulants: Secondary | ICD-10-CM

## 2017-12-02 DIAGNOSIS — I639 Cerebral infarction, unspecified: Secondary | ICD-10-CM | POA: Diagnosis not present

## 2017-12-02 LAB — POCT INR: INR: 1.7 — AB (ref 2.0–3.0)

## 2017-12-09 ENCOUNTER — Ambulatory Visit (INDEPENDENT_AMBULATORY_CARE_PROVIDER_SITE_OTHER): Payer: Medicare Other | Admitting: Pharmacist

## 2017-12-09 DIAGNOSIS — Z7901 Long term (current) use of anticoagulants: Secondary | ICD-10-CM

## 2017-12-09 DIAGNOSIS — I359 Nonrheumatic aortic valve disorder, unspecified: Secondary | ICD-10-CM

## 2017-12-09 LAB — POCT INR: INR: 2.3 (ref 2.0–3.0)

## 2017-12-11 ENCOUNTER — Ambulatory Visit: Payer: Medicare Other | Admitting: Family Medicine

## 2017-12-11 ENCOUNTER — Encounter: Payer: Self-pay | Admitting: Family Medicine

## 2017-12-11 ENCOUNTER — Other Ambulatory Visit: Payer: Self-pay | Admitting: Internal Medicine

## 2017-12-11 VITALS — BP 118/76 | HR 79 | Temp 98.5°F | Ht 74.0 in | Wt 218.8 lb

## 2017-12-11 DIAGNOSIS — I7101 Dissection of thoracic aorta: Secondary | ICD-10-CM

## 2017-12-11 DIAGNOSIS — I71012 Dissection of descending thoracic aorta: Secondary | ICD-10-CM

## 2017-12-11 NOTE — Progress Notes (Signed)
Subjective:    Patient ID: Alexander Barton, male    DOB: 03-06-1950, 67 y.o.   MRN: [id number]  HPI   Patient presents to clinic complaining of pain in the diaphragm area and also pain on both sides of his rib cage for the past 3 to 4 weeks. Also has a dull achy pain on left side that is better when laying down, worse when sitting up and improves with tylenol.  Patient was diagnosed with dissecting aneurysm of thoracic aorta back in September, he has been following with cardiothoracic surgery. Per patient and wife he is not a candidate for the classic surgical repair due to having 3 different aneurysm areas. The treatment plan at this time is to keep BP under control, goal of less than 135/80 and monitor self for any dissection symptoms.   Patient called his cardiothoracic team at [facility] and was advised as follows by the PA: 1 week history of side pain does not seem consistent with aortic dissection/thoracic aortic disease. Discussed warning signs of aortic dissection/rupture and encouraged patient to go to ED should he experience these. Symptoms more consistent with GERD/gas. Patient was advised to remain upright for at least 30 minutes after meals, to limit spicy/acidic foods, and to take zantac if symptoms recur. Patient strongly encouraged to follow up with his PCP Leone Haven, MD or to contact us if symptoms worsen. Will RTC in January as scheduled.  Patient has appt for repeat CT scans in January for follow up on aneurysm with her Cardiothoracic team at [facility].   Patient Active Problem List   Diagnosis Date Noted  . Dissecting aneurysm of thoracic aorta, Stanford type B ([place]) 11/08/2017  . Anemia 11/08/2017  . Hyponatremia 11/08/2017  . Overweight 07/01/2017  . TIA (transient ischemic attack)   . Stroke ([place])   . GERD (gastroesophageal reflux disease)   . Arthritis   . Neck pain 12/01/2016  . Knee osteoarthritis 12/01/2016  . Physical deconditioning 06/24/2016  . Dyspnea on  exertion 06/23/2016  . Chronic pain of right knee 12/18/2015  . Chronic obstructive pulmonary disease ([place]) 11/14/2014  . BPH (benign prostatic hyperplasia) 11/14/2014  . Preventative health care 11/14/2014  . Long term current use of anticoagulant therapy 10/29/2013  . Cerebral infarction ([place]) 06/02/2013  . H/O aortic valve replacement 12/26/2011  . History of aortic arch replacement 01/07/2011  . HTN (hypertension)   . Hyperlipidemia   . Aortic aneurysm, thoracic ([place])   . Aortic valve disease    Social History   Tobacco Use  . Smoking status: Never Smoker  . Smokeless tobacco: Never Used  Substance Use Topics  . Alcohol use: No    Alcohol/week: 0.0 standard drinks    Review of Systems  Constitutional: Negative for chills, fatigue and fever.  HENT: Negative for congestion, ear pain, sinus pain and sore throat.   Eyes: Negative.   Respiratory: Negative for cough, shortness of breath and wheezing.   Cardiovascular: Negative for chest pain, palpitations and leg swelling. "side pain & pain in diaphragm" Gastrointestinal: Negative for abdominal pain, diarrhea, nausea and vomiting.  Genitourinary: Negative for dysuria, frequency and urgency.  Musculoskeletal: Negative for arthralgias and myalgias.  Skin: Negative for color change, pallor and rash.  Neurological: Negative for syncope, light-headedness and headaches.  Psychiatric/Behavioral: The patient is not nervous/anxious.       Objective:   Physical Exam  Constitutional: He is oriented to person, place, and time. No distress.  Non toxic  HENT:  Head: Normocephalic and atraumatic.  Eyes: Conjunctivae and EOM are normal. No scleral icterus.  Neck: Neck supple. No JVD present. No tracheal deviation present.  Cardiovascular: Normal rate, regular rhythm and normal heart sounds.  Pulmonary/Chest: Effort normal and breath sounds normal. No respiratory distress.  Musculoskeletal: Normal range of motion. He exhibits no edema.         Arms: Red circle indicates area of pain patient feels on left side. Pain is positional. Improves when laying and with tylenol. Describes it as "pressure at times"  Neurological: He is alert and oriented to person, place, and time.  Skin: Skin is warm and dry. He is not diaphoretic. No pallor.  Psychiatric: He has a normal mood and affect. His behavior is normal.  Nursing note and vitals reviewed.     Vitals:   12/11/17 1512  BP: 118/76  Pulse: 79  Temp: 98.5 F (36.9 C)  SpO2: 94%   Assessment & Plan:    A total of 25 minutes were spent face-to-face with the patient during this encounter and over half of that time was spent on counseling and coordination of care. The patient was counseled on monitoring self for any symptom changes, going to ER if changes occur.   Dissecting aneurysm of thoracic aorta - we will do repeat CBC in clinic today to monitor for any anemia.  When patient had this issue initially, he became very anemic due to blood loss.  We will compare his level today to his previous level to see if any blood loss is occurring.  Long discussion with patient and wife had that we would need to get a CT scan to further investigate cause of his current complaints.  Patient would prefer to hold off on getting another CT scan at this time due to having one planned for January, and just monitor his symptoms and see what results of CBC show.  Patient advised that if at any time his pain becomes even slightly worse, he develops shortness of breath, chest pain, abdominal pain, indigestion/gas type symptoms to go to ER right away.  Advised patient that often times aneurysms can present in an odd way and appear as symptoms that may seem minor but end up being extremely serious.  Now that we know patient has an aneurysm, we must be hypervigilant and monitoring his symptoms.  Patient and wife are monitoring his blood pressures and keeping a log of readings.  They know the goal is to keep  blood pressure under 135/80.  Patient and wife verbalized understanding of monitoring his symptoms and agree to go to emergency department if anything changes.  Patient will keep regularly scheduled follow-up with PCP as planned.  He will return to clinic sooner if any issues arise.

## 2017-12-12 ENCOUNTER — Encounter: Payer: Self-pay | Admitting: Family Medicine

## 2017-12-12 LAB — CBC WITH DIFFERENTIAL/PLATELET
Basophils Absolute: 48 cells/uL (ref 0–200)
Basophils Relative: 1 %
Eosinophils Absolute: 322 cells/uL (ref 15–500)
Eosinophils Relative: 6.7 %
HCT: 36.6 % — ABNORMAL LOW (ref 38.5–50.0)
Hemoglobin: 12.9 g/dL — ABNORMAL LOW (ref 13.2–17.1)
Lymphs Abs: 1819 cells/uL (ref 850–3900)
MCH: 31.3 pg (ref 27.0–33.0)
MCHC: 35.2 g/dL (ref 32.0–36.0)
MCV: 88.8 fL (ref 80.0–100.0)
MPV: 10.5 fL (ref 7.5–12.5)
Monocytes Relative: 11.7 %
Neutro Abs: 2050 cells/uL (ref 1500–7800)
Neutrophils Relative %: 42.7 %
Platelets: 249 10*3/uL (ref 140–400)
RBC: 4.12 10*6/uL — ABNORMAL LOW (ref 4.20–5.80)
RDW: 13.6 % (ref 11.0–15.0)
Total Lymphocyte: 37.9 %
WBC mixed population: 562 cells/uL (ref 200–950)
WBC: 4.8 10*3/uL (ref 3.8–10.8)

## 2017-12-16 ENCOUNTER — Ambulatory Visit (INDEPENDENT_AMBULATORY_CARE_PROVIDER_SITE_OTHER): Payer: Medicare Other | Admitting: Pharmacist Clinician (PhC)/ Clinical Pharmacy Specialist

## 2017-12-16 DIAGNOSIS — Z952 Presence of prosthetic heart valve: Secondary | ICD-10-CM | POA: Diagnosis not present

## 2017-12-16 DIAGNOSIS — I359 Nonrheumatic aortic valve disorder, unspecified: Secondary | ICD-10-CM

## 2017-12-16 DIAGNOSIS — I639 Cerebral infarction, unspecified: Secondary | ICD-10-CM | POA: Diagnosis not present

## 2017-12-16 DIAGNOSIS — Z7901 Long term (current) use of anticoagulants: Secondary | ICD-10-CM | POA: Diagnosis not present

## 2017-12-16 LAB — POCT INR: INR: 2.5 (ref 2.0–3.0)

## 2017-12-23 ENCOUNTER — Ambulatory Visit: Payer: Medicare Other

## 2017-12-23 ENCOUNTER — Ambulatory Visit (INDEPENDENT_AMBULATORY_CARE_PROVIDER_SITE_OTHER): Payer: Medicare Other

## 2017-12-23 ENCOUNTER — Telehealth: Payer: Self-pay

## 2017-12-23 DIAGNOSIS — I359 Nonrheumatic aortic valve disorder, unspecified: Secondary | ICD-10-CM | POA: Diagnosis not present

## 2017-12-23 DIAGNOSIS — Z7901 Long term (current) use of anticoagulants: Secondary | ICD-10-CM

## 2017-12-23 LAB — POCT INR: INR: 2.1 (ref 2.0–3.0)

## 2017-12-23 NOTE — Telephone Encounter (Signed)
See anticoag note

## 2018-01-04 ENCOUNTER — Ambulatory Visit: Payer: Medicare Other

## 2018-01-04 ENCOUNTER — Ambulatory Visit: Payer: Medicare Other | Admitting: Family Medicine

## 2018-01-04 ENCOUNTER — Encounter: Payer: Self-pay | Admitting: Family Medicine

## 2018-01-04 VITALS — BP 110/72 | HR 75 | Temp 98.2°F | Ht 74.0 in | Wt 221.2 lb

## 2018-01-04 DIAGNOSIS — I7101 Dissection of thoracic aorta: Secondary | ICD-10-CM

## 2018-01-04 DIAGNOSIS — M940 Chondrocostal junction syndrome [Tietze]: Secondary | ICD-10-CM

## 2018-01-04 DIAGNOSIS — E041 Nontoxic single thyroid nodule: Secondary | ICD-10-CM | POA: Insufficient documentation

## 2018-01-04 DIAGNOSIS — R109 Unspecified abdominal pain: Secondary | ICD-10-CM | POA: Insufficient documentation

## 2018-01-04 DIAGNOSIS — I71012 Dissection of descending thoracic aorta: Secondary | ICD-10-CM

## 2018-01-04 DIAGNOSIS — I10 Essential (primary) hypertension: Secondary | ICD-10-CM | POA: Diagnosis not present

## 2018-01-04 DIAGNOSIS — J069 Acute upper respiratory infection, unspecified: Secondary | ICD-10-CM | POA: Diagnosis not present

## 2018-01-04 LAB — TSH: TSH: 2.32 u[IU]/mL (ref 0.35–4.50)

## 2018-01-04 MED ORDER — AMLODIPINE BESYLATE 5 MG PO TABS: 5.0000 mg | ORAL_TABLET | Freq: Every day | ORAL | 1 refills | Status: DC

## 2018-01-04 NOTE — Assessment & Plan Note (Signed)
Symptoms consistent with viral upper respiratory infection.  Discussed trial of Flonase.  He will monitor.

## 2018-01-04 NOTE — Assessment & Plan Note (Signed)
Well-controlled.  Continue current regimen. 

## 2018-01-04 NOTE — Assessment & Plan Note (Signed)
Symptoms most consistent with mild costochondritis.  He will monitor at this time.

## 2018-01-04 NOTE — Progress Notes (Signed)
[name], MD Phone: [phone]  Alexander Barton is a 67 y.o. male who presents today for follow-up.  CC: Aortic dissection, abdominal pain, costochondritis, URI  Aortic dissection: Patient has done well.  He has followed up with his surgeon.  It appears the dissection has extended to some degree.  They also noted an aneurysm.  Patient reports he was advised of these things.  He continues on his Coumadin.  He notes no chest pain or dyspnea.  Hypertension: Typically running less than 125/60s-70s.  He is on amlodipine and metoprolol.  Abdominal pain: Patient notes his bilateral abdomen has been uncomfortable at times.  Notes it comes and goes.  Notes it is triggered by him sitting up.  He notes if he reaches over it will bother him though it also occurs with him just sitting there.  He has been on Senokot and that has caused loose stool.  He has had no blood in his stool.  He rarely has to strain.  No nausea or vomiting.  Not necessarily associated with food intake.  Patient reports he has several costochondral joints on the right that are somewhat tender and he describes it as a burning sensation.  It will last for a couple of minutes and go away on its own.  URI: Patient notes for the last couple of days he has had congestion in his nose and sinuses.  He is blowing out clear/green mucus.  No fevers.  Mild cough.  Nonproductive.  No medications for this.  Noted to have a thyroid nodule on his prior CT imaging.  Needs follow-up with ultrasound and TSH.  Social History   Tobacco Use  Smoking Status Never Smoker  Smokeless Tobacco Never Used     ROS see history of present illness  Objective  Physical Exam Vitals:   01/04/18 1027  BP: 110/72  Pulse: 75  Temp: 98.2 F (36.8 C)  SpO2: 95%    BP Readings from Last 3 Encounters:  01/04/18 110/72  12/11/17 118/76  11/06/17 104/68   Wt Readings from Last 3 Encounters:  01/04/18 221 lb 3.2 oz (100.3 kg)  12/11/17 218  lb 12.8 oz (99.2 kg)  11/06/17 216 lb (98 kg)    Physical Exam  Constitutional: No distress.  Cardiovascular: Normal rate, regular rhythm and normal heart sounds.  Pulmonary/Chest: Effort normal and breath sounds normal. He exhibits tenderness (Over right lower costochondral joints).  Abdominal: Soft. Bowel sounds are normal. He exhibits no distension. There is no tenderness. There is no rebound and no guarding.  Musculoskeletal: He exhibits no edema.  Neurological: He is alert.  Skin: Skin is warm and dry. He is not diaphoretic.     Assessment/Plan: Please see individual problem list.  Dissecting aneurysm of thoracic aorta, Stanford type B ([place]) Patient seems to have done quite well.  Asymptomatic at this time.  Discussed symptoms that would necessitate evaluation.  He will continue to follow with his surgeon and cardiologist.  HTN (hypertension) Well-controlled.  Continue current regimen.  URI (upper respiratory infection) Symptoms consistent with viral upper respiratory infection.  Discussed trial of Flonase.  He will monitor.  Abdominal pain This has been going on for some time.  No obvious cause noted on prior CT imaging.  Does not seem to be consistent with discomfort that one would have from dissection.  Could be musculoskeletal.  Discussed referral to general surgery given cholelithiasis that was seen and referral to GI given persistence of symptoms though the patient opted to  defer these until after his dissection checkup in January.  He is given return precautions.  Costochondritis Symptoms most consistent with mild costochondritis.  He will monitor at this time.  Thyroid nodule TSH and ultrasound ordered.   Orders Placed This Encounter  Procedures  . US THYROID    Standing Status:   Future    Standing Expiration Date:   03/08/2019    Order Specific Question:   Reason for Exam (SYMPTOM  OR DIAGNOSIS REQUIRED)    Answer:   Thyroid nodule    Order Specific Question:    Preferred imaging location?    Answer:   [facility]  . TSH    Meds ordered this encounter  Medications  . amLODipine (NORVASC) 5 MG tablet    Sig: Take 1 tablet (5 mg total) by mouth daily.    Dispense:  90 tablet    Refill:  1     [name], MD [place]

## 2018-01-04 NOTE — Assessment & Plan Note (Signed)
Patient seems to have done quite well.  Asymptomatic at this time.  Discussed symptoms that would necessitate evaluation.  He will continue to follow with his surgeon and cardiologist.

## 2018-01-04 NOTE — Patient Instructions (Signed)
Nice to see you. We could have you see GI for your persistent abdominal discomfort. Please try Flonase for your upper respiratory symptoms.

## 2018-01-04 NOTE — Assessment & Plan Note (Addendum)
This has been going on for some time.  No obvious cause noted on prior CT imaging.  Does not seem to be consistent with discomfort that one would have from dissection.  Could be musculoskeletal.  Discussed referral to general surgery given cholelithiasis that was seen and referral to GI given persistence of symptoms though the patient opted to defer these until after his dissection checkup in January.  He is given return precautions.

## 2018-01-04 NOTE — Assessment & Plan Note (Signed)
TSH and ultrasound ordered.

## 2018-01-06 LAB — POCT INR: INR: 2.6 (ref 2.0–3.0)

## 2018-01-07 ENCOUNTER — Ambulatory Visit (INDEPENDENT_AMBULATORY_CARE_PROVIDER_SITE_OTHER): Payer: Medicare Other | Admitting: Pharmacist

## 2018-01-07 DIAGNOSIS — G459 Transient cerebral ischemic attack, unspecified: Secondary | ICD-10-CM | POA: Diagnosis not present

## 2018-01-07 DIAGNOSIS — Z7901 Long term (current) use of anticoagulants: Secondary | ICD-10-CM

## 2018-01-07 DIAGNOSIS — I359 Nonrheumatic aortic valve disorder, unspecified: Secondary | ICD-10-CM | POA: Diagnosis not present

## 2018-01-13 ENCOUNTER — Ambulatory Visit (INDEPENDENT_AMBULATORY_CARE_PROVIDER_SITE_OTHER): Payer: Medicare Other

## 2018-01-13 VITALS — BP 128/74 | HR 81 | Temp 97.9°F | Resp 16 | Ht 73.0 in | Wt 222.1 lb

## 2018-01-13 DIAGNOSIS — Z0000 Encounter for general adult medical examination without abnormal findings: Secondary | ICD-10-CM

## 2018-01-13 NOTE — Patient Instructions (Addendum)
  [name] , Thank you for taking time to come for your Medicare Wellness Visit. I appreciate your ongoing commitment to your health goals. Please review the following plan we discussed and let me know if I can assist you in the future.   These are the goals we discussed: Goals      Patient Stated   . Increase physical activity (pt-stated)     Resume golfing as tolerated Activity as tolerated       This is a list of the screening recommended for you and due dates:  Health Maintenance  Topic Date Due  . Tetanus Vaccine  02/03/2021  . Colon Cancer Screening  10/11/2024  . Flu Shot  Completed  .  Hepatitis C: One time screening is recommended by Center for Disease Control  (CDC) for  adults born from [birth year] through 1967.   Completed  . Pneumonia vaccines  Completed

## 2018-01-13 NOTE — Progress Notes (Signed)
Subjective:   Alexander Barton is a 67 y.o. male who presents for Medicare Annual/Subsequent preventive examination.  Review of Systems:  No ROS.  Medicare Wellness Visit. Additional risk factors are reflected in the social history. Cardiac Risk Factors include: advanced age (>7men, >64 women);hypertension;male gender     Objective:    Vitals: BP 128/74 (BP Location: Right Arm, Patient Position: Sitting, Cuff Size: Normal)   Pulse 81   Temp 97.9 F (36.6 C) (Oral)   Resp 16   Ht 6\' 1"  (1.854 m)   Wt 222 lb 1.9 oz (100.8 kg)   SpO2 95%   BMI 29.31 kg/m   Body mass index is 29.31 kg/m.  Advanced Directives 01/13/2018 10/20/2017 12/22/2016 08/12/2016 06/24/2016 06/23/2016 12/05/2015  Does Patient Have a Medical Advance Directive? Yes No Yes Yes Yes Yes No  Type of Paramedic of [place];Living will - Living will;Healthcare Power of Attorney Living will Healthcare Power of [place] -  Does patient want to make changes to medical advance directive? No - Patient declined - No - Patient declined - No - Patient declined No - Patient declined -  Copy of [place] in Chart? Yes - validated most recent copy scanned in chart (See row information) - Yes - No - copy requested No - copy requested -  Would patient like information on creating a medical advance directive? - No - Patient declined - - - - Yes - Scientist, clinical (histocompatibility and immunogenetics) given    Tobacco Social History   Tobacco Use  Smoking Status Never Smoker  Smokeless Tobacco Never Used     Counseling given: Not Answered   Clinical Intake:  Pre-visit preparation completed: Yes  Pain : No/denies pain     Nutritional Status: BMI 25 -29 Overweight Diabetes: No  How often do you need to have someone help you when you read instructions, pamphlets, or other written materials from your doctor or pharmacy?: 1 - Never  Interpreter Needed?: No     Past Medical  History:  Diagnosis Date  . Aortic aneurysm, thoracic ([place])   . Aortic valve disease   . Arthritis   . BPH (benign prostatic hyperplasia)   . COPD (chronic obstructive pulmonary disease) ([place])   . GERD (gastroesophageal reflux disease)   . HTN (hypertension)   . Hypercholesterolemia   . Stroke ([place])   . TIA (transient ischemic attack)    Past Surgical History:  Procedure Laterality Date  . ACHILLES TENDON REPAIR    . AORTIC VALVE REPLACEMENT     #23 On-X valve conduit  . ASCENDING AORTIC ANEURYSM REPAIR    . BACK SURGERY    . FOOT SURGERY     Family History  Problem Relation Age of Onset  . Alzheimer's disease Father   . Diabetes Father        age onset DM  . Hypertension Sister   . Hypertension Brother   . Diabetes Mother   . Diabetes Sister   . Colon cancer Neg Hx    Social History   Socioeconomic History  . Marital status: Married    Spouse name: Not on file  . Number of children: 2  . Years of education: Not on file  . Highest education level: Not on file  Occupational History  . Occupation: Scientist, clinical (histocompatibility and immunogenetics): [employer]  . Financial resource strain: Not hard at all  . Food insecurity:  Worry: Never true    Inability: Never true  . Transportation needs:    Medical: No    Non-medical: No  Tobacco Use  . Smoking status: Never Smoker  . Smokeless tobacco: Never Used  Substance and Sexual Activity  . Alcohol use: No    Alcohol/week: 0.0 standard drinks  . Drug use: No  . Sexual activity: Not Currently  Lifestyle  . Physical activity:    Days per week: 3 days    Minutes per session: 40 min  . Stress: Not at all  Relationships  . Social connections:    Talks on phone: Not on file    Gets together: Not on file    Attends religious service: Not on file    Active member of club or organization: Not on file    Attends meetings of clubs or organizations: Not on file    Relationship status: Married  Other Topics Concern  .  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   One son and one daughter   He lives in [place]   One caffeinated beverage daily   08/30/2014    Outpatient Encounter Medications as of 01/13/2018  Medication Sig  . albuterol (PROVENTIL HFA;VENTOLIN HFA) 108 (90 Base) MCG/ACT inhaler Inhale 2 puffs into the lungs every 4 (four) hours as needed for wheezing or shortness of breath.  Marland Kitchen amLODipine (NORVASC) 5 MG tablet Take 1 tablet (5 mg total) by mouth daily.  Marland Kitchen aspirin 81 MG tablet Take 81 mg by mouth at bedtime.   . metoprolol succinate (TOPROL-XL) 50 MG 24 hr tablet TAKE 1 TABLET (50 MG TOTAL) BY MOUTH DAILY. TAKE WITH OR IMMEDIATELY FOLLOWING A MEAL.  . NON FORMULARY   . Polyethylene Glycol 3350 (MIRALAX PO) Take by mouth.  . senna-docusate (SENOKOT-S) 8.6-50 MG tablet Take by mouth.  . SPIRIVA RESPIMAT 2.5 MCG/ACT AERS INHALE 2.5 MCG INTO THE LUNGS 2 (TWO) TIMES DAILY.  . SYMBICORT 160-4.5 MCG/ACT inhaler TAKE 2 PUFFS BY MOUTH TWICE A DAY  . tamsulosin (FLOMAX) 0.4 MG CAPS capsule TAKE 2 CAPSULES BY MOUTH EVERY DAY  . warfarin (COUMADIN) 2 MG tablet TAKE 1 TABLET WITH 5 MG TABLET DAILY AS DIRECTED BY COUMADIN CLINIC  . warfarin (COUMADIN) 5 MG tablet TAKE 1 TO 2 TABLETS DAILY AS DIRECTED BY COUMADIN CLINIC   No facility-administered encounter medications on file as of 01/13/2018.     Activities of Daily Living In your present state of health, do you have any difficulty performing the following activities: 01/13/2018  Hearing? N  Vision? N  Difficulty concentrating or making decisions? N  Walking or climbing stairs? N  Dressing or bathing? N  Doing errands, shopping? N  Preparing Food and eating ? N  Using the Toilet? N  In the past six months, have you accidently leaked urine? N  Do you have problems with loss of bowel control? N  Managing your Medications? N  Managing your Finances? N    Housekeeping or managing your Housekeeping? N  Some recent data might be hidden    Patient Care Team: Alexander Haven, MD as PCP - General (Family Medicine)   Assessment:   This is a routine wellness examination for [name].  Monitoring BP at home. Reports readings WNL. Taking all medications as directed. Shingrix discussed.   Health Screenings  Colonoscopy- completed 10/12/2014. Next due 10/11/2024. PSA- completed 11/14/14. Results  0.34 Bone Density-none.  Glaucoma- none reported.  Hearing- passes the whisper test.  Hemoglobin A1C- completed 12/22/16. Results 6.1 Cholesterol- completed 12/10/2016. Results 253 PT/INR-completed 01/06/18. Results 2.6.  Fasting labs due  Social  Alcohol intake-no Smoking history- no Smokers in home?none Illicit drug use?no Exercise-walking, treadmill, elliptical. 2-3 days, 40 minutes Diet-regular Sexually Active-not currently Multiple Partners-no  Safety  Patient feesl safe at home-yes Patient does have smoke detectors at home-yes Patient does wear sunscreen or protective clothing when in direct sunlight-yes Patient does wear seat belt when driving or riding with others-yes  Activities of Daily Living Patient can do their own household chores. Denies needing assistance with: driving, feeding themselves, getting from bed to chair, getting to the toilet, bathing/showering, dressing, managing money, climbing flight of stairs, or preparing meals.   Depression Screen Patient denies losing interest in daily life, feeling hopeless, or crying easily over simple problems.   Fall Screen Patient denies being afraid of falling or falling in the last year.   Memory Screen Patient denies problems with memory, misplacing items, and is able to balance checkbook/bank accounts. Patient is alert, normal appearance, oriented to person/place/and time. Correctly identified the president of the [place], recall of 3 objects, and performing simple calculations.   Patient displays appropriate judgement and can read correct time from watch face.   Immunizations The following Immunizations are up to date: Influenza, shingles, pneumonia, and tetanus.   Other Providers Patient Care Team: Alexander Haven, MD as PCP - General (Family Medicine)  Exercise Activities and Dietary recommendations Current Exercise Habits: Home exercise routine, Type of exercise: walking;treadmill;calisthenics, Time (Minutes): 40, Frequency (Times/Week): 3, Weekly Exercise (Minutes/Week): 120, Intensity: Mild  Goals      Patient Stated   . Increase physical activity (pt-stated)     Resume golfing as tolerated Activity as tolerated       Fall Risk Fall Risk  01/04/2018 12/22/2016 11/19/2016 11/05/2016 08/12/2016  Falls in the past year? 0 No No No No  Number falls in past yr: 0 - - - -  Injury with Fall? 0 - - - -   Depression Screen PHQ 2/9 Scores 01/04/2018 12/22/2016 11/19/2016 11/05/2016  PHQ - 2 Score 0 0 0 1  PHQ- 9 Score - 0 5 8    Cognitive Function MMSE - Mini Mental State Exam 01/13/2018 12/22/2016 12/05/2015  Orientation to time 5 5 5   Orientation to Place 5 5 5   Registration 3 3 3   Attention/ Calculation 5 5 5   Recall 3 3 3   Language- name 2 objects 2 2 2   Language- repeat 1 1 1   Language- follow 3 step command 3 3 3   Language- read & follow direction 1 1 1   Write a sentence 1 1 1   Copy design 1 1 1   Total score 30 30 30         Immunization History  Administered Date(s) Administered  . Influenza,inj,Quad PF,6+ Mos 11/14/2014  . Pneumococcal Conjugate-13 11/14/2014  . Pneumococcal Polysaccharide-23 12/05/2015   Screening Tests Health Maintenance  Topic Date Due  . TETANUS/TDAP  02/03/2021  . COLONOSCOPY  10/11/2024  . INFLUENZA VACCINE  Completed  . Hepatitis C Screening  Completed  . PNA vac Low Risk Adult  Completed       Plan:    End of life planning; Advance aging; Advanced directives discussed. Copy of current HCPOA/Living  Will on file.    I have personally reviewed and noted the following in the patient's chart:   .  Medical and social history . Use of alcohol, tobacco or illicit drugs  . Current medications and supplements . Functional ability and status . Nutritional status . Physical activity . Advanced directives . List of other physicians . Hospitalizations, surgeries, and ER visits in previous 12 months . Vitals . Screenings to include cognitive, depression, and falls . Referrals and appointments  In addition, I have reviewed and discussed with patient certain preventive protocols, quality metrics, and best practice recommendations. A written personalized care plan for preventive services as well as general preventive health recommendations were provided to patient.     Varney Biles, LPN  [id number]

## 2018-01-14 NOTE — Progress Notes (Signed)
I have reviewed the above note and agree.  Pahola Dimmitt, M.D.  

## 2018-01-18 ENCOUNTER — Ambulatory Visit
Admission: RE | Admit: 2018-01-18 | Discharge: 2018-01-18 | Disposition: A | Discharge status: Home / Self Care | Payer: Medicare Other | Source: Ambulatory Visit | Attending: Family Medicine | Admitting: Family Medicine

## 2018-01-18 DIAGNOSIS — E041 Nontoxic single thyroid nodule: Secondary | ICD-10-CM | POA: Diagnosis not present

## 2018-01-19 ENCOUNTER — Encounter: Payer: Self-pay | Admitting: *Deleted

## 2018-01-20 LAB — POCT INR

## 2018-02-01 NOTE — Progress Notes (Signed)
Alexander Barton Date of Birth: 25-Dec-1950 Medical Record #[id number]  History of Present Illness: Alexander Barton is seen today for followup of valvular heart disease. He is status post mechanical aortic valve replacement in October 2008 with a #23 mm ON-X mechanical valve conduit with a button Bentall procedure and Hemi arch graft  and is on chronic Coumadin. He had a TIA in January 2012 and was admitted in May 2014 with a right thalamic CVA. He is now on aspirin and Coumadin.  His evaluation in the hospital included an echocardiogram which showed normal valve function. Carotid Dopplers were OK.  He was admitted in May 2018 at [facility] with worsening dyspnea. Seen by our service there. CTA of the chest demonstrated no evidence of PE or significant parenchymal lung disease. There is evidence of prior mechanical AVR and hemi-arch replacement. Coronary artery calcification is also noted as was cholelithiasis.  Myoview study was done and was normal. Echo also done as noted below.   He was admitted in September 2019 at [facility] with acute epigastric, chest, and back pain. Found to have a type B aortic dissection. Managed medically and followed by Dr. Mart Piggs at [facility]. He is being considered for investigational stenting of his aneurysm.  On follow up today he reports feeling pretty well. He does note tenderness at his Xyphoid process. Some intermittent sharp transient pain in the left chest. BP control has been very good with systolic 389-373 and diastolic 42-87. Goal of INR now 2-2.5. No claudication symptoms. He is in a clinical trial for cholesterol management through his primary care.   Current Outpatient Medications on File Prior to Visit  Medication Sig Dispense Refill  . albuterol (PROVENTIL HFA;VENTOLIN HFA) 108 (90 Base) MCG/ACT inhaler Inhale 2 puffs into the lungs every 4 (four) hours as needed for wheezing or shortness of breath. 1 Inhaler 3  . amLODipine (NORVASC) 5 MG tablet  Take 1 tablet (5 mg total) by mouth daily. 90 tablet 1  . aspirin 81 MG tablet Take 81 mg by mouth at bedtime.     . metoprolol succinate (TOPROL-XL) 50 MG 24 hr tablet TAKE 1 TABLET (50 MG TOTAL) BY MOUTH DAILY. TAKE WITH OR IMMEDIATELY FOLLOWING A MEAL. 90 tablet 1  . NON FORMULARY     . Polyethylene Glycol 3350 (MIRALAX PO) Take by mouth.    . senna-docusate (SENOKOT-S) 8.6-50 MG tablet Take by mouth.    . SPIRIVA RESPIMAT 2.5 MCG/ACT AERS INHALE 2.5 MCG INTO THE LUNGS 2 (TWO) TIMES DAILY. 1 Inhaler 5  . SYMBICORT 160-4.5 MCG/ACT inhaler TAKE 2 PUFFS BY MOUTH TWICE A DAY 30.6 Inhaler 4  . tamsulosin (FLOMAX) 0.4 MG CAPS capsule TAKE 2 CAPSULES BY MOUTH EVERY DAY 174 capsule 1  . warfarin (COUMADIN) 2 MG tablet TAKE 1 TABLET WITH 5 MG TABLET DAILY AS DIRECTED BY COUMADIN CLINIC 90 tablet 0  . warfarin (COUMADIN) 5 MG tablet TAKE 1 TO 2 TABLETS DAILY AS DIRECTED BY COUMADIN CLINIC 120 tablet 0   No current facility-administered medications on file prior to visit.     Allergies  Allergen Reactions  . Codeine Anaphylaxis  . Gadolinium Derivatives Other (See Comments)    Other Reaction: intense sneezing MRI contrast  . Hydrocodone Anaphylaxis  . Iodinated Diagnostic Agents Other (See Comments)    Uncontrollable sneezing, needs sedative the night before and hour and benadryl before using Other Reaction: sneezing with SOB after prep  . Statins Other (See Comments)    Muscle  spasm Muscle spasm  Muscle spasm  Myalgias  . Hydromorphone Other (See Comments)    Pt says he has never taken  . Lipitor [Atorvastatin Calcium] Other (See Comments)    Myalgias   . Other     Hydromorphone  . Seroquel [Quetiapine Fumerate] Other (See Comments)    "bad trip" tongue swell    Past Medical History:  Diagnosis Date  . Aortic aneurysm, thoracic ([place])   . Aortic valve disease   . Arthritis   . BPH (benign prostatic hyperplasia)   . COPD (chronic obstructive pulmonary disease) ([place])   . GERD  (gastroesophageal reflux disease)   . HTN (hypertension)   . Hypercholesterolemia   . Stroke ([place])   . TIA (transient ischemic attack)     Past Surgical History:  Procedure Laterality Date  . ACHILLES TENDON REPAIR    . AORTIC VALVE REPLACEMENT     #23 On-X valve conduit  . ASCENDING AORTIC ANEURYSM REPAIR    . BACK SURGERY    . FOOT SURGERY      Social History   Tobacco Use  Smoking Status Never Smoker  Smokeless Tobacco Never Used    Social History   Substance and Sexual Activity  Alcohol Use No  . Alcohol/week: 0.0 standard drinks    Family History  Problem Relation Age of Onset  . Alzheimer's disease Father   . Diabetes Father        age onset DM  . Hypertension Sister   . Hypertension Brother   . Diabetes Mother   . Diabetes Sister   . Colon cancer Neg Hx     Review of Systems: As noted in history of present illness.  All other systems were reviewed and are negative.  Physical Exam: BP 112/70   Pulse 83   Ht 6\' 2"  (1.88 m)   Wt 222 lb (100.7 kg)   BMI 28.50 kg/m  GENERAL:  Well appearing WM HEENT:  PERRL, EOMI, sclera are clear. Oropharynx is clear. NECK:  No jugular venous distention, carotid upstroke brisk and symmetric, no bruits, no thyromegaly or adenopathy LUNGS:  Clear to auscultation bilaterally CHEST:  There is tenderness to palpation at the Xyphoid process.  HEART:  RRR,  PMI not displaced or sustained, loud mechanical AV click,  no S3, no S4: no clicks, no rubs, no murmurs ABD:  Soft, nontender. BS +, no masses or bruits. No hepatomegaly, no splenomegaly EXT:  2 + pulses throughout, no edema, no cyanosis no clubbing SKIN:  Warm and dry.  No rashes NEURO:  Alert and oriented x 3. Cranial nerves II through XII intact. PSYCH:  Cognitively intact   LABORATORY DATA:  Lab Results  Component Value Date   WBC 4.8 12/11/2017   HGB 12.9 (L) 12/11/2017   HCT 36.6 (L) 12/11/2017   PLT 249 12/11/2017   GLUCOSE 138 (H) 11/16/2017   CHOL 253  (H) 12/10/2016   TRIG 112.0 12/10/2016   HDL 45.00 12/10/2016   LDLDIRECT 172.0 11/14/2014   LDLCALC 185 (H) 12/10/2016   ALT 21 10/20/2017   AST 30 10/20/2017   NA 137 11/16/2017   K 4.4 11/16/2017   CL 103 11/16/2017   CREATININE 1.21 11/16/2017   BUN 15 11/16/2017   CO2 28 11/16/2017   TSH 2.32 01/04/2018   PSA 0.34 11/14/2014   INR 2.6 01/06/2018   HGBA1C 6.1 12/22/2016   Echo 06/25/16: Study Conclusions  - Procedure narrative: Transthoracic echocardiography. The study   was technically difficult. -  Left ventricle: The cavity size was normal. There was mild   concentric hypertrophy. Systolic function was mildly reduced. The   estimated ejection fraction was in the range of 45% to 50%.   Doppler parameters are consistent with abnormal left ventricular   relaxation (grade 1 diastolic dysfunction). - Aortic valve: A mechanical prosthesis was present and functioning   normally. Valve area (Vmax): 1.87 cm^2. - Mitral valve: There was mild regurgitation. - Left atrium: The atrium was moderately dilated. - Right atrium: The atrium was mildly dilated. - Pulmonary arteries: Systolic pressure was within the normal   range.  Myoview 06/25/16: Pharmacological myocardial perfusion imaging study with no significant  ischemia Normal wall motion, EF estimated at 50% No EKG changes concerning for ischemia at peak stress or in recovery. Low risk scan   Signed, Esmond Plants, MD, Ph.D [facility]  Procedure: CTA Chest with and without contrast Procedure: CTA Abdomen and pelvis with and without contrast  Indication: Thoracic aortic aneurysm (TAA), known, follow up, Z95.2 Presence of prosthetic heart valve, Z79.01 Long term (current) use of anticoagulants, I71.4 Abdominal aortic aneurysm, without rupture (CMS-HCC)  Comparison: TAA 10/22/2017  Technique: TAA protocol, Helical high pitch non ECG gated dual source. Volumetric pre and post contrasted, arterial only, images through  the chest, abdomen and pelvis were obtained. This study was acquired following the IV administration of iodinated contrast material, given the patient's indications for the examination. If IV contrast material had not been administered, the likelihood of detecting abnormalities relevant to the patient's condition would have been substantially decreased.   Complex image reconstruction post processing was likewise performed as indicated to increase the sensitivity of detecting clinically relevant pathology. Evaluation of solid organs may be limited due dedicated imaging technique for vascular processes as per indication for examination.  Findings: CTA Chest, abdomen, and pelvis: Aorta: Redemonstrated are postsurgical changes of aortic valve replacement and ascending thoracic aortic repair, with coronary reimplantation. Increased size of remaining dissection, with maximum site of dilation in the proximal descending thoracic aorta measuring 5.4 x 4.8 cm, prior 4.1 x 4.3 cm, but increased extent of residual dissection which extends from the proximal aspect of the aortic arch, to the left common iliac artery, with the involvement of the infrarenal abdominal aorta and proximal left common iliac artery new from prior. Persistent opacification of the false lumen throughout its entire length. The celiac trunk and SMA are patent, the area of rise predominantly from the false lumen and there is a small dissection flap in the proximal stent unchanged from prior. The right renal artery, left renal artery, and the IMA arise from the true lumen and are patent and normal in size without dissection. Suprarenal and infrarenal abdominal aorta are patent. The infrarenal and iliac arterial system are patent and normal in size.  Left common carotid: Patent and normal in size without significant atherosclerosis  Right common carotid: Patent and normal in size without significant atherosclerosis  Left  vertebral artery: Patent and normal in size without significant atherosclerosis  Right vertebral artery: Patent and normal in size without significant atherosclerosis  Left subclavian artery: Patent and normal in size without significant atherosclerosis. The dissection extends into the proximal aspect. Right subclavian artery: Patent and normal in size without significant atherosclerosis    CT Chest, Abdomen and Pelvis:  Visualized portion of the thyroid is unchanged 9 mm hypodense lesion of the right thyroid lobe. No mediastinal, hilar, or axillary lymphadenopathy. There is biatrial enlargement. Interatrial septal aneurysm noted. The pulmonary  arteries are of normal size and configuration. There are mild coronary artery atherosclerotic calcifications in the distal left main, proximal LAD and circumflex coronary arteries. No pericardial effusion.  The central airways are patent. Linear atelectasis or scar in the anterior right upper lobe, in the bilateral lung bases left greater than right. There are multiple scattered calcified granulomas. Second simulation the left major fissure. 2 mm solid nodule in the left lower lobe on image 53, is unchanged from prior. No pleural effusion.  Limited evaluation of the abdominal pelvic viscera due to arterial phase of contrast. Heterogenous liver. Cholelithiasis. There is no biliary ductal dilation. Normal pancreas, spleen, bilateral adrenal glands, and the left kidney. Unchanged synovitic cyst at the inferior pole of the right kidney measuring 3.3 cm. Bilateral extrarenal pelvis is underdistended bladder process is present. There is no focal abnormal bowel wall thickening or dilation. Normal appendix. There is no retroperitoneal or mesenteric lymphadenopathy. Abdominal wall soft tissues are unremarkable. Bilateral L5 pars interarticularis defects. No aggressive osseous lesions.    Impression: 1. Postsurgical changes of ascending thoracic  aortic repair and aortic valve replacement with coronary implantation (Bentall procedure).  2. Increased size and extent of remaining thoracic aortic dissection. Maximum site of dilation in the proximal descending thoracic aorta measures 5.5 x 5.2 cm, prior 4.3 x 4.0 cm. The residual dissection extends from the thoracic aortic arch to the left common iliac artery, with new involvement of the infrarenal abdominal aorta and left common iliac artery are new compared to prior.  3. Multiple solid nodules, many of which are calcified, are stable compared to most recent prior, but new compared to 2010. These may represent the sequela of granulomatous disease. If the patient is high risk for lung cancer consider 12 month follow-up CT. (Fleischner Society guidelines 2017).  4. Interatrial septal aneurysm, which can be associated with PFO.  Electronically Reviewed by: Tora Perches, MD, [facility] Electronically Reviewed on: 11/23/2017 4:33 PM  I have reviewed the images and concur with the above findings.  Electronically Signed by: Tereasa Coop, MD, [facility] Electronically Signed on: 11/24/2017 12:12 PM  Other Result Information  Interface, Rad Results In - 11/24/2017 12:13 PM EDT Procedure: CTA Chest with and without contrast Procedure: CTA Abdomen and pelvis with and without contrast  Indication: Thoracic aortic aneurysm (TAA), known, follow up, Z95.2 Presence of prosthetic heart valve, Z79.01 Long term (current) use of anticoagulants, I71.4 Abdominal aortic aneurysm, without rupture (CMS-HCC)  Comparison: TAA 10/22/2017  Technique:  TAA protocol, Helical high pitch non ECG gated dual source. Volumetric pre and post contrasted, arterial only, images through the chest, abdomen and pelvis were obtained. This study was acquired following the IV administration of iodinated contrast material, given the patient's indications for the examination.  If IV contrast material had  not been administered, the likelihood of detecting abnormalities relevant to the patient's condition would have been substantially decreased.   Complex image reconstruction post processing was likewise performed as indicated to increase the sensitivity of detecting clinically relevant pathology. Evaluation of solid organs may be limited due dedicated imaging technique for vascular processes as per indication for examination.  Findings: CTA Chest, abdomen, and pelvis: Aorta: Redemonstrated are postsurgical changes of aortic valve replacement and ascending thoracic aortic repair, with coronary reimplantation. Increased size of remaining dissection, with maximum site of dilation in the proximal descending thoracic aorta measuring 5.4 x 4.8 cm, prior 4.1 x 4.3 cm, but increased extent of residual dissection which extends from the proximal aspect of  the aortic arch, to the left common iliac artery, with the involvement of the infrarenal abdominal aorta and proximal left common iliac artery new from prior. Persistent opacification of the false lumen throughout its entire length. The celiac trunk and SMA are patent, the area of rise predominantly from the false lumen and there is a small dissection flap in the proximal stent unchanged from prior. The right renal artery, left renal artery, and the IMA arise from the true lumen and are patent and normal in size without dissection. Suprarenal and infrarenal abdominal aorta are patent. The infrarenal and iliac arterial system are patent and normal in size.  Left common carotid: Patent and normal in size without significant atherosclerosis  Right common carotid: Patent and normal in size without significant atherosclerosis  Left vertebral artery: Patent and normal in size without significant atherosclerosis  Right vertebral artery: Patent and normal in size without significant atherosclerosis  Left subclavian artery: Patent and normal in size  without significant atherosclerosis. The dissection extends into the proximal aspect. Right subclavian artery: Patent and normal in size without significant atherosclerosis    CT Chest, Abdomen and Pelvis:  Visualized portion of the thyroid is unchanged 9 mm hypodense lesion of the right thyroid lobe. No mediastinal, hilar, or axillary lymphadenopathy. There is biatrial enlargement. Interatrial septal aneurysm noted. The pulmonary arteries are of normal size and configuration. There are mild coronary artery atherosclerotic calcifications in the distal left main, proximal LAD and circumflex coronary arteries.  No pericardial effusion.  The central airways are patent. Linear atelectasis or scar in the anterior right upper lobe, in the bilateral lung bases left greater than right. There are multiple scattered calcified granulomas. Second simulation the left major fissure. 2 mm solid nodule in the left lower lobe on image 53, is unchanged from prior. No pleural effusion.  Limited evaluation of the abdominal pelvic viscera due to arterial phase of contrast. Heterogenous liver. Cholelithiasis. There is no biliary ductal dilation. Normal pancreas, spleen, bilateral adrenal glands, and the left kidney. Unchanged synovitic cyst at the inferior pole of the right kidney measuring 3.3 cm. Bilateral extrarenal pelvis is underdistended bladder process is present. There is no focal abnormal bowel wall thickening or dilation. Normal appendix. There is no retroperitoneal or mesenteric lymphadenopathy. Abdominal wall soft tissues are unremarkable. Bilateral L5 pars interarticularis defects. No aggressive osseous lesions.    Impression: 1. Postsurgical changes of ascending thoracic aortic repair and aortic valve replacement with coronary implantation (Bentall procedure).  2. Increased size and extent of remaining thoracic aortic dissection. Maximum site of dilation in the proximal descending  thoracic aorta measures 5.5 x 5.2 cm, prior 4.3 x 4.0 cm. The residual dissection extends from the thoracic aortic arch to the left common iliac artery, with new involvement of the infrarenal abdominal aorta and left common iliac artery are new compared to prior.  3. Multiple solid nodules, many of which are calcified, are stable compared to most recent prior, but new compared to 2010. These may represent the sequela of granulomatous disease. If the patient is high risk for lung cancer consider 12 month follow-up CT. (Fleischner Society guidelines 2017).  4. Interatrial septal aneurysm, which can be associated with PFO.  Electronically Reviewed by:  Tora Perches, MD, [facility] Electronically Reviewed on:  11/23/2017 4:33 PM  I have reviewed the images and concur with the above findings.  Electronically Signed by:  Tereasa Coop, MD, [facility] Electronically Signed on:  11/24/2017 12:12 PM     Assessment /  Plan: 1. Status post mechanical aortic valve replacement with Bentall procedure. Continue anticoagulation with Coumadin. Goal INR 2-2.5 with recent dissection. Echo in May 2018 showed normal valve function. Valve exam is normal.  2. Type B Aortic dissection. Diagnosed in September 2019. Followed by Dr. Mart Piggs at [facility]. Being considered for investigational stenting. BP control is optimal. Avoid lifting.  3. Hypertension-controlled 4. Status post CVA/right thalamic. Continue combined aspirin and Coumadin therapy.  5. Hypercholesterolemia. History of intolerance to lipitor and crestor. Now participating in clinical trial thru primary care.   Follow up in 6 months.

## 2018-02-04 ENCOUNTER — Ambulatory Visit: Payer: Medicare Other | Admitting: Cardiology

## 2018-02-04 ENCOUNTER — Ambulatory Visit (INDEPENDENT_AMBULATORY_CARE_PROVIDER_SITE_OTHER): Payer: Medicare Other | Admitting: Pharmacist Clinician (PhC)/ Clinical Pharmacy Specialist

## 2018-02-04 ENCOUNTER — Encounter: Payer: Self-pay | Admitting: Cardiology

## 2018-02-04 VITALS — BP 112/70 | HR 83 | Ht 74.0 in | Wt 222.0 lb

## 2018-02-04 DIAGNOSIS — I712 Thoracic aortic aneurysm, without rupture, unspecified: Secondary | ICD-10-CM

## 2018-02-04 DIAGNOSIS — I7103 Dissection of thoracoabdominal aorta: Secondary | ICD-10-CM

## 2018-02-04 DIAGNOSIS — Z952 Presence of prosthetic heart valve: Secondary | ICD-10-CM

## 2018-02-04 DIAGNOSIS — I359 Nonrheumatic aortic valve disorder, unspecified: Secondary | ICD-10-CM

## 2018-02-04 DIAGNOSIS — I10 Essential (primary) hypertension: Secondary | ICD-10-CM

## 2018-02-04 DIAGNOSIS — Z7901 Long term (current) use of anticoagulants: Secondary | ICD-10-CM

## 2018-02-04 LAB — POCT INR: INR: 2.7 (ref 2.0–3.0)

## 2018-02-04 NOTE — Telephone Encounter (Signed)
Unread mychart message mailed to patient 

## 2018-02-11 ENCOUNTER — Encounter (INDEPENDENT_AMBULATORY_CARE_PROVIDER_SITE_OTHER): Payer: Medicare Other | Admitting: Pharmacist Clinician (PhC)/ Clinical Pharmacy Specialist

## 2018-02-11 DIAGNOSIS — Z952 Presence of prosthetic heart valve: Secondary | ICD-10-CM

## 2018-02-11 DIAGNOSIS — I639 Cerebral infarction, unspecified: Secondary | ICD-10-CM

## 2018-02-11 DIAGNOSIS — I359 Nonrheumatic aortic valve disorder, unspecified: Secondary | ICD-10-CM

## 2018-02-11 DIAGNOSIS — Z7901 Long term (current) use of anticoagulants: Secondary | ICD-10-CM

## 2018-02-11 NOTE — Progress Notes (Signed)
This encounter was created in error - please disregard.

## 2018-02-19 ENCOUNTER — Ambulatory Visit (INDEPENDENT_AMBULATORY_CARE_PROVIDER_SITE_OTHER): Payer: Medicare Other | Admitting: Internal Medicine

## 2018-02-19 DIAGNOSIS — I359 Nonrheumatic aortic valve disorder, unspecified: Secondary | ICD-10-CM

## 2018-02-19 DIAGNOSIS — Z7901 Long term (current) use of anticoagulants: Secondary | ICD-10-CM | POA: Diagnosis not present

## 2018-02-19 LAB — POCT INR: INR: 2.4 (ref 2.0–3.0)

## 2018-03-04 ENCOUNTER — Ambulatory Visit (INDEPENDENT_AMBULATORY_CARE_PROVIDER_SITE_OTHER): Payer: Medicare Other | Admitting: Pharmacist

## 2018-03-04 DIAGNOSIS — I359 Nonrheumatic aortic valve disorder, unspecified: Secondary | ICD-10-CM

## 2018-03-04 DIAGNOSIS — Z7901 Long term (current) use of anticoagulants: Secondary | ICD-10-CM | POA: Diagnosis not present

## 2018-03-04 LAB — POCT INR: INR: 2.1 (ref 2.0–3.0)

## 2018-03-18 ENCOUNTER — Ambulatory Visit (INDEPENDENT_AMBULATORY_CARE_PROVIDER_SITE_OTHER): Payer: Medicare Other | Admitting: Pharmacist Clinician (PhC)/ Clinical Pharmacy Specialist

## 2018-03-18 DIAGNOSIS — I359 Nonrheumatic aortic valve disorder, unspecified: Secondary | ICD-10-CM

## 2018-03-18 DIAGNOSIS — Z7901 Long term (current) use of anticoagulants: Secondary | ICD-10-CM

## 2018-03-18 DIAGNOSIS — I639 Cerebral infarction, unspecified: Secondary | ICD-10-CM | POA: Diagnosis not present

## 2018-03-18 DIAGNOSIS — Z952 Presence of prosthetic heart valve: Secondary | ICD-10-CM | POA: Diagnosis not present

## 2018-03-18 LAB — POCT INR: INR: 1.9 — AB (ref 2.0–3.0)

## 2018-03-23 ENCOUNTER — Other Ambulatory Visit: Payer: Self-pay | Admitting: Cardiology

## 2018-03-29 ENCOUNTER — Ambulatory Visit: Payer: Self-pay | Admitting: *Deleted

## 2018-03-29 ENCOUNTER — Ambulatory Visit: Payer: Medicare Other | Admitting: Family Medicine

## 2018-03-29 VITALS — BP 102/64 | HR 96 | Temp 98.9°F | Resp 18 | Ht 74.0 in | Wt 220.4 lb

## 2018-03-29 DIAGNOSIS — J40 Bronchitis, not specified as acute or chronic: Secondary | ICD-10-CM

## 2018-03-29 DIAGNOSIS — R05 Cough: Secondary | ICD-10-CM

## 2018-03-29 DIAGNOSIS — R059 Cough, unspecified: Secondary | ICD-10-CM

## 2018-03-29 DIAGNOSIS — J019 Acute sinusitis, unspecified: Secondary | ICD-10-CM | POA: Diagnosis not present

## 2018-03-29 MED ORDER — ALBUTEROL SULFATE (2.5 MG/3ML) 0.083% IN NEBU
2.5000 mg | INHALATION_SOLUTION | Freq: Once | RESPIRATORY_TRACT | Status: AC
  Administered 2018-03-29: 2.5 mg via RESPIRATORY_TRACT

## 2018-03-29 MED ORDER — BENZONATATE 100 MG PO CAPS: ORAL_CAPSULE | ORAL | 1 refills | Status: DC

## 2018-03-29 MED ORDER — DOXYCYCLINE HYCLATE 100 MG PO TABS: 100.0000 mg | ORAL_TABLET | Freq: Two times a day (BID) | ORAL | 0 refills | Status: DC

## 2018-03-29 NOTE — Progress Notes (Signed)
Subjective:    Patient ID: Alexander Barton, male    DOB: 10-26-1950, 68 y.o.   MRN: [id number]  HPI   Patient presents to clinic complaining of sinus congestion, thick yellow nasal drainage, drainage down back of throat and a harsh dry cough & chest congestion.  States cough seems to be worse at night.  He has been taking his Symbicort and Spiriva as prescribed.  Has not used albuterol rescue inhaler.  Denies fever or chills.  Denies chest pain.  Denies shortness of breath.  Does report some wheezing, but this will coincide with a harsh coughing fit.  Denies nausea/vomiting or diarrhea.  Patient Active Problem List   Diagnosis Date Noted  . URI (upper respiratory infection) 01/04/2018  . Abdominal pain 01/04/2018  . Costochondritis 01/04/2018  . Thyroid nodule 01/04/2018  . Dissecting aneurysm of thoracic aorta, [place] type B ([place]) 11/08/2017  . Anemia 11/08/2017  . Hyponatremia 11/08/2017  . Overweight 07/01/2017  . TIA (transient ischemic attack)   . Stroke ([place])   . GERD (gastroesophageal reflux disease)   . Arthritis   . Neck pain 12/01/2016  . Knee osteoarthritis 12/01/2016  . Physical deconditioning 06/24/2016  . Dyspnea on exertion 06/23/2016  . Chronic pain of right knee 12/18/2015  . Chronic obstructive pulmonary disease ([place]) 11/14/2014  . BPH (benign prostatic hyperplasia) 11/14/2014  . Preventative health care 11/14/2014  . Long term current use of anticoagulant therapy 10/29/2013  . Cerebral infarction ([place]) 06/02/2013  . H/O aortic valve replacement 12/26/2011  . History of aortic arch replacement 01/07/2011  . HTN (hypertension)   . Hyperlipidemia   . Aortic aneurysm, thoracic ([place])   . Aortic valve disease    Social History   Tobacco Use  . Smoking status: Never Smoker  . Smokeless tobacco: Never Used  Substance Use Topics  . Alcohol use: No    Alcohol/week: 0.0 standard drinks   Review of Systems   Constitutional: Negative for chills,  fatigue and fever.  HENT: +sinus congestion, thick drainage, sinus pain, ear fullness.  Eyes: Negative.   Respiratory: +harsh cough, no SOB. Some wheeze with very harsh cough. Cardiovascular: Negative for chest pain, palpitations and leg swelling.  Gastrointestinal: Negative for abdominal pain, diarrhea, nausea and vomiting.  Genitourinary: Negative for dysuria, frequency and urgency.  Musculoskeletal: Negative for arthralgias and myalgias.  Skin: Negative for color change, pallor and rash.  Neurological: Negative for syncope, light-headedness and headaches.  Psychiatric/Behavioral: The patient is not nervous/anxious.       Objective:   Physical Exam Vitals signs and nursing note reviewed.  Constitutional:      General: He is not in acute distress.    Appearance: He is not ill-appearing, toxic-appearing or diaphoretic.  HENT:     Head: Normocephalic and atraumatic.     Ears:     Comments: Fullness bilat TMs. TMs pink in color    Nose: Nasal tenderness, congestion and rhinorrhea present.     Right Turbinates: Swollen and pale.     Left Turbinates: Swollen and pale.     Right Sinus: Maxillary sinus tenderness and frontal sinus tenderness present.     Left Sinus: Maxillary sinus tenderness and frontal sinus tenderness present.     Mouth/Throat:     Mouth: Mucous membranes are moist.     Pharynx: No oropharyngeal exudate or posterior oropharyngeal erythema.     Comments: +post nasal drip patterning Eyes:     General: No scleral icterus.    Extraocular  Movements: Extraocular movements intact.     Pupils: Pupils are equal, round, and reactive to light.  Neurological:     Mental Status: He is alert.     Vitals:   03/29/18 1129  BP: 102/64  Pulse: 96  Resp: 18  Temp: 98.9 F (37.2 C)  SpO2: 95%      Assessment & Plan:   Acute sinusitis - patient symptoms do appear consistent with a sinus infection.  He will take doxycycline twice daily for 10 days.  He will do saline  nasal rinses.  Patient does regularly check his INR due to taking Coumadin, he has been advised to make them aware that he is on antibiotics with his upcoming INR check planned for Thursday, 04/01/2018.   Cough/bronchitis - suspect patient's congestion that is draining down the back of throat has irritated upper respiratory causing a bronchitis.  Patient will use his albuterol inhaler at least 2 puffs in the a.m. and every 4 hours throughout the day as needed during this illness.  After illness has resolved he will go back to just using as needed.  Administrations This Visit    albuterol (PROVENTIL) (2.5 MG/3ML) 0.083% nebulizer solution 2.5 mg    Admin Date 03/29/2018 Action Given Dose 2.5 mg Route Nebulization Administered By Neta Ehlers, RMA         Patient will keep regularly scheduled follow-up with PCP as planned.  He will return to clinic sooner if issues arise.  Patient advised that if his breathing worsens, he is to call on-call right away for recommendations and or go to emergency room for evaluation.  Patient verbalizes understanding.

## 2018-03-29 NOTE — Telephone Encounter (Signed)
FYI

## 2018-03-29 NOTE — Patient Instructions (Signed)
Be sure to let them know you are on antibiotic with your upcoming INR check on Thursday  Take 2 puffs of the albuterol inhaler every AM for next week. Can use up to every 4 hours as needed. If coughing more at night, can take 2 puffs prior to bed as well.

## 2018-03-29 NOTE — Telephone Encounter (Signed)
Pt called with complaints a having a chest cold and problems breathing; these symptoms started 03/21/2018; he says that he can not take antihistamines or codeine; the pt says that has yellow-greenish sputum; the pt also reports having a nasal congestion and is having a hard time breathing due to that; he also says that he is having chest soreness due to coughing; he reports having COPD, and he is wheezing; he has not used his albuterol inhaler but he has used his steam inhaler; he says that he used tessalon perles; recommendations made per nurse triage protocol; the pt normally sees Dr Martyn Ehrich, [facility] but this provider has no availability within the  Parameters per guidelines; pt offered and accepted appointment with Philis Nettle, [place], 03/29/2018 at 1120; he verbalized understanding; will route to office for notification.   Reason for Disposition . [1] Longstanding difficulty breathing (e.g., CHF, COPD, emphysema) AND [2] WORSE than normal  Answer Assessment - Initial Assessment Questions 1. RESPIRATORY STATUS: "Describe your breathing?" (e.g., wheezing, shortness of breath, unable to speak, severe coughing)      Wheezing; shortness of breath 2. ONSET: "When did this breathing problem begin?"      03/21/2018 3. PATTERN "Does the difficult breathing come and go, or has it been constant since it started?"      Constant, worsening 4. SEVERITY: "How bad is your breathing?" (e.g., mild, moderate, severe)    - MILD: No SOB at rest, mild SOB with walking, speaks normally in sentences, can lay down, no retractions, pulse < 100.    - MODERATE: SOB at rest, SOB with minimal exertion and prefers to sit, cannot lie down flat, speaks in phrases, mild retractions, audible wheezing, pulse 100-120.    - SEVERE: Very SOB at rest, speaks in single words, struggling to breathe, sitting hunched forward, retractions, pulse > 120      moderate 5. RECURRENT SYMPTOM: "Have you had difficulty breathing  before?" If so, ask: "When was the last time?" and "What happened that time?"      Yes pt has COPD, and has 1 bad cold per winter 6. CARDIAC HISTORY: "Do you have any history of heart disease?" (e.g., heart attack, angina, bypass surgery, angioplasty)      Dissected aortic aneurysm which was repaired; mechanical heart valve; 2 new anuerysms 7. LUNG HISTORY: "Do you have any history of lung disease?"  (e.g., pulmonary embolus, asthma, emphysema)     copd 8. CAUSE: "What do you think is causing the breathing problem?"      Congestion in chest 9. OTHER SYMPTOMS: "Do you have any other symptoms? (e.g., dizziness, runny nose, cough, chest pain, fever)     Nasal and chest congestion; productive cough  10. PREGNANCY: "Is there any chance you are pregnant?" "When was your last menstrual period?"       n/a 11. TRAVEL: "Have you traveled out of the country in the last month?" (e.g., travel history, exposures)       no  Protocols used: BREATHING DIFFICULTY-A-AH

## 2018-04-02 ENCOUNTER — Ambulatory Visit (INDEPENDENT_AMBULATORY_CARE_PROVIDER_SITE_OTHER): Payer: Medicare Other | Admitting: Cardiology

## 2018-04-02 DIAGNOSIS — Z7901 Long term (current) use of anticoagulants: Secondary | ICD-10-CM

## 2018-04-02 DIAGNOSIS — I359 Nonrheumatic aortic valve disorder, unspecified: Secondary | ICD-10-CM | POA: Diagnosis not present

## 2018-04-02 LAB — POCT INR: INR: 2.5 (ref 2.0–3.0)

## 2018-04-17 LAB — PROTIME-INR: INR: 2.5 — AB (ref 0.9–1.1)

## 2018-04-17 LAB — POCT INR: INR: 2.5 (ref 2.0–3.0)

## 2018-04-19 ENCOUNTER — Ambulatory Visit (INDEPENDENT_AMBULATORY_CARE_PROVIDER_SITE_OTHER): Payer: Medicare Other | Admitting: Cardiovascular Disease

## 2018-04-19 DIAGNOSIS — I359 Nonrheumatic aortic valve disorder, unspecified: Secondary | ICD-10-CM | POA: Diagnosis not present

## 2018-04-19 DIAGNOSIS — Z7901 Long term (current) use of anticoagulants: Secondary | ICD-10-CM

## 2018-04-22 ENCOUNTER — Other Ambulatory Visit: Payer: Self-pay | Admitting: Cardiology

## 2018-04-22 ENCOUNTER — Other Ambulatory Visit: Payer: Self-pay | Admitting: Pharmacist Clinician (PhC)/ Clinical Pharmacy Specialist

## 2018-04-22 MED ORDER — WARFARIN SODIUM 5 MG PO TABS: ORAL_TABLET | ORAL | 0 refills | Status: DC

## 2018-04-22 MED ORDER — WARFARIN SODIUM 2 MG PO TABS: ORAL_TABLET | ORAL | 0 refills | Status: DC

## 2018-04-22 NOTE — Telephone Encounter (Signed)
Please review for refill, Thanks !  

## 2018-04-30 ENCOUNTER — Ambulatory Visit (INDEPENDENT_AMBULATORY_CARE_PROVIDER_SITE_OTHER): Payer: Medicare Other | Admitting: Pharmacist Clinician (PhC)/ Clinical Pharmacy Specialist

## 2018-04-30 ENCOUNTER — Other Ambulatory Visit: Payer: Self-pay | Admitting: Family Medicine

## 2018-04-30 DIAGNOSIS — Z7901 Long term (current) use of anticoagulants: Secondary | ICD-10-CM | POA: Diagnosis not present

## 2018-04-30 DIAGNOSIS — I359 Nonrheumatic aortic valve disorder, unspecified: Secondary | ICD-10-CM | POA: Diagnosis not present

## 2018-04-30 LAB — POCT INR: INR: 2.2 (ref 2.0–3.0)

## 2018-05-07 ENCOUNTER — Telehealth: Payer: Self-pay | Admitting: Family Medicine

## 2018-05-07 ENCOUNTER — Encounter: Payer: Self-pay | Admitting: Family Medicine

## 2018-05-07 ENCOUNTER — Ambulatory Visit (INDEPENDENT_AMBULATORY_CARE_PROVIDER_SITE_OTHER): Payer: Medicare Other | Admitting: Family Medicine

## 2018-05-07 ENCOUNTER — Other Ambulatory Visit: Payer: Self-pay

## 2018-05-07 DIAGNOSIS — I7101 Dissection of thoracic aorta: Secondary | ICD-10-CM

## 2018-05-07 DIAGNOSIS — I71012 Dissection of descending thoracic aorta: Secondary | ICD-10-CM

## 2018-05-07 DIAGNOSIS — I10 Essential (primary) hypertension: Secondary | ICD-10-CM | POA: Diagnosis not present

## 2018-05-07 DIAGNOSIS — W19XXXA Unspecified fall, initial encounter: Secondary | ICD-10-CM | POA: Diagnosis not present

## 2018-05-07 DIAGNOSIS — J449 Chronic obstructive pulmonary disease, unspecified: Secondary | ICD-10-CM | POA: Diagnosis not present

## 2018-05-07 NOTE — Telephone Encounter (Signed)
Called and informed the patient that his copay would be billed, pt understood and I scheduled him a 4 month appt with the provider.  Colbi Schiltz,cma

## 2018-05-07 NOTE — Assessment & Plan Note (Addendum)
Patient appears to be doing quite well with regards to this.  He has had no chest pain or other symptoms.  The sensation of restriction with lying down could be related to this.  He does report he had an echo in January at [facility] though I do not see this in care everywhere.  He will continue to follow with his surgeon and cardiologist.  He will seek medical attention if he develops symptoms concerning for his dissection.

## 2018-05-07 NOTE — Assessment & Plan Note (Signed)
Discussed that the sensation he is having near his right axilla could be related to his fall with possible injury directly in that area or related to nerve impingement from his back.  Discussed that we are somewhat limited in evaluation given that this is a video visit.  He does not have any other significant symptoms that would point to an obvious other cause at this time.  Discussed monitoring over the next week and if it is not improving he should contact us.  If he has significant worsening he will be evaluated immediately.

## 2018-05-07 NOTE — Assessment & Plan Note (Signed)
They report this is well controlled.  He will send [place] his readings.  He will continue amlodipine.  I did discuss that the mild swelling could be amlodipine related or related and sitting down frequently.  Discussed ambulation in the house and propping his legs up.  If this is persistent he will let us know.

## 2018-05-07 NOTE — Assessment & Plan Note (Signed)
Currently well controlled.  Continue current inhaler regimen.

## 2018-05-07 NOTE — Progress Notes (Signed)
Virtual Visit via Video Note  This visit type was conducted due to national recommendations for restrictions regarding the COVID-19 pandemic (e.g. social distancing).  This format is felt to be most appropriate for this patient at this time.  All issues noted in this document were discussed and addressed.  No physical exam was performed (except for noted visual exam findings with Video Visits).    I connected with Alexander Barton on 05/07/18 at  9:30 AM EDT by a video enabled telemedicine application and verified that I am speaking with the correct person using two identifiers. Location patient: home Location provider:work  Persons participating in the virtual visit: patient, provider, Alexander Barton  I discussed the limitations of evaluation and management by telemedicine and the availability of in person appointments. The patient expressed understanding and agreed to proceed.  Reason for visit: 35-month follow-up  HPI: Hypertension: They note his blood pressure has been running well controlled.  He is taking amlodipine.  He has had no chest pain.  He has had no shortness of breath.  He does note some mild lower extremity edema in his bilateral feet.  This will alternate between feet and occasionally occurs in both feet at the same time.  He notes he has had a sensation of restriction when he lays down to get in bed.  This has been occurring since he had his aortic dissection.  He notes this improves if he rolls over and gets comfortable.  This has not worsened.  He notes no PND.  He does report he has been sitting down more frequently given that he has been at home with the coronavirus issues going around.  Aortic dissection: Patient is still following with cardiothoracic surgery at [facility].  It sounds as though they are considering whether or not to do surgery in the future.  He is scheduled for follow-up in July.  Fall: Patient notes he was on a ladder changing a rod for the shower when he fell and  caught himself with his right arm.  He notes injury to his back with this though no other significant injury.  He notes since then he has had a sticking/burning type discomfort near his right axilla.  This is intermittent and sometimes does linger.  It has not worsened.  It is not worsened by movement of his shoulder.  There is no numbness or weakness in his arms.  It is not tender to palpation.  COPD: He is taking Spiriva and Symbicort.  Rarely has to use his albuterol.  No shortness of breath, cough, or wheezing.   ROS: See pertinent positives and negatives per HPI.  Past Medical History:  Diagnosis Date  . Aortic aneurysm, thoracic ([place])   . Aortic valve disease   . Arthritis   . BPH (benign prostatic hyperplasia)   . COPD (chronic obstructive pulmonary disease) ([place])   . GERD (gastroesophageal reflux disease)   . HTN (hypertension)   . Hypercholesterolemia   . Stroke ([place])   . TIA (transient ischemic attack)     Past Surgical History:  Procedure Laterality Date  . ACHILLES TENDON REPAIR    . AORTIC VALVE REPLACEMENT     #23 On-X valve conduit  . ASCENDING AORTIC ANEURYSM REPAIR    . BACK SURGERY    . FOOT SURGERY      Family History  Problem Relation Age of Onset  . Alzheimer's disease Father   . Diabetes Father        age onset DM  .  Hypertension Sister   . Hypertension Brother   . Diabetes Mother   . Diabetes Sister   . Colon cancer Neg Hx     SOCIAL HX: Non-smoker.   Current Outpatient Medications:  .  albuterol (PROVENTIL HFA;VENTOLIN HFA) 108 (90 Base) MCG/ACT inhaler, Inhale 2 puffs into the lungs every 4 (four) hours as needed for wheezing or shortness of breath., Disp: 1 Inhaler, Rfl: 3 .  amLODipine (NORVASC) 5 MG tablet, TAKE 1 TABLET BY MOUTH EVERY DAY, Disp: 90 tablet, Rfl: 1 .  aspirin 81 MG tablet, Take 81 mg by mouth at bedtime. , Disp: , Rfl:  .  benzonatate (TESSALON) 100 MG capsule, Take 1 or 2 capsules every 8 hours as needed for cough, Disp:  30 capsule, Rfl: 1 .  doxycycline (VIBRA-TABS) 100 MG tablet, Take 1 tablet (100 mg total) by mouth 2 (two) times daily., Disp: 20 tablet, Rfl: 0 .  metoprolol succinate (TOPROL-XL) 50 MG 24 hr tablet, TAKE 1 TABLET (50 MG TOTAL) BY MOUTH DAILY. TAKE WITH OR IMMEDIATELY FOLLOWING A MEAL., Disp: 90 tablet, Rfl: 1 .  NON FORMULARY, , Disp: , Rfl:  .  Polyethylene Glycol 3350 (MIRALAX PO), Take by mouth., Disp: , Rfl:  .  senna-docusate (SENOKOT-S) 8.6-50 MG tablet, Take by mouth., Disp: , Rfl:  .  SPIRIVA RESPIMAT 2.5 MCG/ACT AERS, INHALE 2.5 MCG INTO THE LUNGS 2 (TWO) TIMES DAILY., Disp: 1 Inhaler, Rfl: 5 .  SYMBICORT 160-4.5 MCG/ACT inhaler, TAKE 2 PUFFS BY MOUTH TWICE A DAY, Disp: 30.6 Inhaler, Rfl: 4 .  tamsulosin (FLOMAX) 0.4 MG CAPS capsule, TAKE 2 CAPSULES BY MOUTH EVERY DAY, Disp: 174 capsule, Rfl: 1 .  warfarin (COUMADIN) 2 MG tablet, Take 1 tablet daily as directed with 5 mg tablet, Disp: 90 tablet, Rfl: 0 .  warfarin (COUMADIN) 5 MG tablet, Take 1 tablet daily as directed with 2 mg tablet, Disp: 90 tablet, Rfl: 0  EXAM:  VITALS per patient if applicable: None.  GENERAL: alert, oriented, appears well and in no acute distress  HEENT: atraumatic, conjunttiva clear, no obvious abnormalities on inspection of external nose and ears  NECK: normal movements of the head and neck  LUNGS: on inspection no signs of respiratory distress, breathing rate appears normal, no obvious gross SOB, gasping or wheezing  CV: no obvious cyanosis  MS: moves all visible extremities without noticeable abnormality, full range of motion of right shoulder without any discomfort per patient report  PSYCH/NEURO: pleasant and cooperative, no obvious depression or anxiety, speech and thought processing grossly intact  ASSESSMENT AND PLAN:  Discussed the following assessment and plan:  No diagnosis found.  Dissecting aneurysm of thoracic aorta, [place] type B ([place]) Patient appears to be doing quite well  with regards to this.  He has had no chest pain or other symptoms.  The sensation of restriction with lying down could be related to this.  He does report he had an echo in January at [facility] though I do not see this in care everywhere.  He will continue to follow with his surgeon and cardiologist.  He will seek medical attention if he develops symptoms concerning for his dissection.  HTN (hypertension) They report this is well controlled.  He will send [place] his readings.  He will continue amlodipine.  I did discuss that the mild swelling could be amlodipine related or related and sitting down frequently.  Discussed ambulation in the house and propping his legs up.  If this is persistent he will  let us know.  Fall Discussed that the sensation he is having near his right axilla could be related to his fall with possible injury directly in that area or related to nerve impingement from his back.  Discussed that we are somewhat limited in evaluation given that this is a video visit.  He does not have any other significant symptoms that would point to an obvious other cause at this time.  Discussed monitoring over the next week and if it is not improving he should contact us.  If he has significant worsening he will be evaluated immediately.  Chronic obstructive pulmonary disease (HCC) Currently well controlled.  Continue current inhaler regimen.    I discussed the assessment and treatment plan with the patient. The patient was provided an opportunity to ask questions and all were answered. The patient agreed with the plan and demonstrated an understanding of the instructions.   The patient was advised to call back or seek an in-person evaluation if the symptoms worsen or if the condition fails to improve as anticipated.   [name], MD

## 2018-05-07 NOTE — Telephone Encounter (Signed)
Patient needs follow-up in the office in 4 months. Please call him to get him schedule. Please also let him know that he will receive a bill for his copay.

## 2018-05-14 ENCOUNTER — Ambulatory Visit (INDEPENDENT_AMBULATORY_CARE_PROVIDER_SITE_OTHER): Payer: Medicare Other | Admitting: Pharmacist Clinician (PhC)/ Clinical Pharmacy Specialist

## 2018-05-14 DIAGNOSIS — I639 Cerebral infarction, unspecified: Secondary | ICD-10-CM

## 2018-05-14 DIAGNOSIS — Z7901 Long term (current) use of anticoagulants: Secondary | ICD-10-CM | POA: Diagnosis not present

## 2018-05-14 DIAGNOSIS — I359 Nonrheumatic aortic valve disorder, unspecified: Secondary | ICD-10-CM

## 2018-05-14 LAB — POCT INR: INR: 2.3 (ref 2.0–3.0)

## 2018-05-28 ENCOUNTER — Ambulatory Visit (INDEPENDENT_AMBULATORY_CARE_PROVIDER_SITE_OTHER): Payer: Medicare Other | Admitting: Pharmacist

## 2018-05-28 DIAGNOSIS — Z7901 Long term (current) use of anticoagulants: Secondary | ICD-10-CM

## 2018-05-28 DIAGNOSIS — Z952 Presence of prosthetic heart valve: Secondary | ICD-10-CM | POA: Diagnosis not present

## 2018-05-28 DIAGNOSIS — I359 Nonrheumatic aortic valve disorder, unspecified: Secondary | ICD-10-CM

## 2018-05-28 LAB — POCT INR: INR: 2.4 (ref 2.0–3.0)

## 2018-06-03 ENCOUNTER — Other Ambulatory Visit: Payer: Self-pay

## 2018-06-03 ENCOUNTER — Telehealth: Payer: Self-pay | Admitting: Family Medicine

## 2018-06-03 MED ORDER — METOPROLOL SUCCINATE ER 50 MG PO TB24: 50.0000 mg | ORAL_TABLET | Freq: Every day | ORAL | 1 refills | Status: DC

## 2018-06-03 NOTE — Telephone Encounter (Signed)
Copied from [place] [id number]. Topic: Quick Communication - See Telephone Encounter >> Jun 03, 2018  9:15 AM Ivar Drape wrote: CRM for notification. See Telephone encounter for: 06/03/18. Patient would like a refill on his metoprolol succinate (TOPROL-XL) 50 MG 24 hr tablet medication and have it sent to his preferred pharmacy [facility] in [place].

## 2018-06-03 NOTE — Progress Notes (Signed)
A refill for toprol xl was sent to pharmacy today per pt request.  Jakel Alphin,cma

## 2018-06-11 ENCOUNTER — Ambulatory Visit (INDEPENDENT_AMBULATORY_CARE_PROVIDER_SITE_OTHER): Payer: Medicare Other | Admitting: Pharmacist

## 2018-06-11 ENCOUNTER — Encounter: Payer: Self-pay | Admitting: Internal Medicine

## 2018-06-11 ENCOUNTER — Ambulatory Visit (INDEPENDENT_AMBULATORY_CARE_PROVIDER_SITE_OTHER): Payer: Medicare Other | Admitting: Internal Medicine

## 2018-06-11 DIAGNOSIS — J449 Chronic obstructive pulmonary disease, unspecified: Secondary | ICD-10-CM

## 2018-06-11 DIAGNOSIS — I359 Nonrheumatic aortic valve disorder, unspecified: Secondary | ICD-10-CM | POA: Diagnosis not present

## 2018-06-11 DIAGNOSIS — Z7901 Long term (current) use of anticoagulants: Secondary | ICD-10-CM

## 2018-06-11 LAB — POCT INR: INR: 2.5 (ref 2.0–3.0)

## 2018-06-11 MED ORDER — ALBUTEROL SULFATE HFA 108 (90 BASE) MCG/ACT IN AERS: 2.0000 | INHALATION_SPRAY | RESPIRATORY_TRACT | 3 refills | Status: DC | PRN

## 2018-06-11 MED ORDER — BUDESONIDE-FORMOTEROL FUMARATE 160-4.5 MCG/ACT IN AERO: INHALATION_SPRAY | RESPIRATORY_TRACT | 4 refills | Status: DC

## 2018-06-11 MED ORDER — TIOTROPIUM BROMIDE MONOHYDRATE 2.5 MCG/ACT IN AERS: INHALATION_SPRAY | RESPIRATORY_TRACT | 5 refills | Status: DC

## 2018-06-11 NOTE — Patient Instructions (Signed)
Continue Spiriva RESPIMAT 2.5  Continue SYmbicrt  Albuterol as needed

## 2018-06-11 NOTE — Progress Notes (Signed)
Name: Alexander Barton MRN: [id number] DOB: 1950-10-31     CONSULTATION DATE: 06/11/2018  REFERRING MD : Dr. Lacinda Axon      VIDEO/TELEPHONE VISIT    In the setting of the current Covid19 crisis, you are scheduled for a  visit with me on 06/11/2018  Just as we do with many in-office visits, in order for you to participate in this visit, we must obtain consent.   I can obtain your verbal consent now.  PATIENT AGREES AND CONFIRMS -YES This Visit has Audio and Visual Capabilities for optimal patient care experience   Evaluation Performed:  Follow-up visit  This visit type was conducted due to national recommendations for restrictions regarding the COVID-19 Pandemic (e.g. social distancing).  This format is felt to be most appropriate for this patient at this time.  All issues noted in this document were discussed and addressed.     Virtual Visit via Telephone Note     I connected with patient on  06/11/2018  by telephone and verified that I am speaking with the correct person using two identifiers.   I discussed the limitations, risks, security and privacy concerns of performing an evaluation and management service by telephone and the availability of in person appointments. I also discussed with the patient that there may be a patient responsible charge related to this service. The patient expressed understanding and agreed to proceed.   Location of the patient: Home Location of provider: Office Participating persons: Patient and provider only  [place] COPD Office spirometry on July 22, 2016 FEV1 FEC ratio is 60 since her son predicted FEV1 is 2.1 L with 56% predicted FEC is 3.3 L with 63% predicted Taste on office spirometry patient has moderate to severe obstructive airways disease with restrictive lung disease likely related to obesity  LAST OV I  have dicussed ONO and 6MWT test results with patient  The tests DID NOT reveal hypoxia   Patient is a nonsmoker  however was exposed to secondhand next smoke exposure for approximately 20 years Patient is retired but has worked in the [employer] for approximately 20 years and was exposed to [place]:  Follow up COPD    HISTORY OF PRESENT ILLNESS:   Chronic SOB and DOE for many years Albuterol infrequent use Uses inhalers daily-Spiriva and Symbicort  No cough No wheezing No fevers No signs of COPD exacerbation  No exposure to COVID at this time  No signs of infection     Review of Systems:  Gen:  Denies  fever, sweats, chills weigh loss  HEENT: Denies blurred vision, double vision, ear pain, eye pain, hearing loss, nose bleeds, sore throat Cardiac:  No dizziness, chest pain or heaviness, chest tightness,edema, No JVD Resp:   No cough, -sputum production, -shortness of breath,-wheezing, -hemoptysis,  Gi: Denies swallowing difficulty, stomach pain, nausea or vomiting, diarrhea, constipation, bowel incontinence Gu:  Denies bladder incontinence, burning urine Ext:   Denies Joint pain, stiffness or swelling Skin: Denies  skin rash, easy bruising or bleeding or hives Endoc:  Denies polyuria, polydipsia , polyphagia or weight change Psych:   Denies depression, insomnia or hallucinations  Other:  All other systems negative      CT of the chest May 2018 I have Independently reviewed images of  Ct chest   Interpretation: No evidence of pneumonia and no evidence of CHF or evidence of effusions no masses noted   ASSESSMENT / PLAN:   68 year old pleasant  white male follow up today shortness of breath and dyspnea on exertion findings  suggest moderate  COPD Gold stage C with FEV1 56% predicted in the setting of mechanical valve on chronic Coumadin therapy in the setting of morbid obesity and deconditioned state  #1 shortness breath and dyspnea on exertion  deconditioned state with COPD Recent h/o dissecting descending aortic aneurysm Has also Multifactorial disease in  nature related to COPD as well as cardiac disease and deconditioned state Overall adapting to SOB very well on once daily inhaler usage  Moderate COPD  FEV1 of 56% predicted Continue Spiriva RESPIMAT 2.5  Continue SYmbicrt  Albuterol as needed   Obesity -recommend significant weight loss -recommend changing diet  Deconditioned state -Recommend increased daily activity and exercise  history of mechanical valve Follow-up cardiology as recommended     COVID-19 Education: The signs and symptoms of COVID-19 were discussed with the patient and how to seek care for testing (follow up with PCP or arrange E-visit).  The importance of social distancing was discussed today.  Total Time Spent 26 mins   Patient  satisfied with Plan of action and management. All questions answered   Follow up in 6 months  Alexander Barton Alexander Barton, M.D.  [name]  Medical Director [place] Director [facility] Cardio-Pulmonary Department

## 2018-06-30 ENCOUNTER — Ambulatory Visit (INDEPENDENT_AMBULATORY_CARE_PROVIDER_SITE_OTHER): Payer: Medicare Other | Admitting: Pharmacist Clinician (PhC)/ Clinical Pharmacy Specialist

## 2018-06-30 DIAGNOSIS — I359 Nonrheumatic aortic valve disorder, unspecified: Secondary | ICD-10-CM

## 2018-06-30 DIAGNOSIS — Z7901 Long term (current) use of anticoagulants: Secondary | ICD-10-CM | POA: Diagnosis not present

## 2018-06-30 LAB — POCT INR: INR: 2.8 (ref 2.0–3.0)

## 2018-07-14 ENCOUNTER — Ambulatory Visit (INDEPENDENT_AMBULATORY_CARE_PROVIDER_SITE_OTHER): Payer: Medicare Other | Admitting: Family Medicine

## 2018-07-14 ENCOUNTER — Other Ambulatory Visit: Payer: Self-pay

## 2018-07-14 ENCOUNTER — Other Ambulatory Visit: Payer: Self-pay | Admitting: Cardiology

## 2018-07-14 ENCOUNTER — Encounter: Payer: Self-pay | Admitting: Family Medicine

## 2018-07-14 DIAGNOSIS — M7989 Other specified soft tissue disorders: Secondary | ICD-10-CM

## 2018-07-14 DIAGNOSIS — Z7901 Long term (current) use of anticoagulants: Secondary | ICD-10-CM | POA: Diagnosis not present

## 2018-07-14 DIAGNOSIS — J019 Acute sinusitis, unspecified: Secondary | ICD-10-CM

## 2018-07-14 LAB — POCT INR: INR: 2.5 (ref 2.0–3.0)

## 2018-07-14 MED ORDER — FUROSEMIDE 20 MG PO TABS: ORAL_TABLET | ORAL | 1 refills | Status: DC

## 2018-07-14 MED ORDER — DOXYCYCLINE HYCLATE 100 MG PO TABS: 100.0000 mg | ORAL_TABLET | Freq: Two times a day (BID) | ORAL | 0 refills | Status: DC

## 2018-07-14 NOTE — Progress Notes (Signed)
Patient ID: Alexander Barton, male   DOB: Jun 13, 1950, 68 y.o.   MRN: [id number]    Virtual Visit via video Note  This visit type was conducted due to national recommendations for restrictions regarding the COVID-19 pandemic (e.g. social distancing).  This format is felt to be most appropriate for this patient at this time.  All issues noted in this document were discussed and addressed.  No physical exam was performed (except for noted visual exam findings with Video Visits).   I connected with [name] & wife today at  8:40 AM EDT by a video enabled telemedicine application and verified that I am speaking with the correct person using two identifiers. Location patient: home Location provider: [facility] Persons participating in the virtual visit: patient, provider  I discussed the limitations, risks, security and privacy concerns of performing an evaluation and management service by video and the availability of in person appointments. I also discussed with the patient that there may be a patient responsible charge related to this service. The patient expressed understanding and agreed to proceed.   HPI:  Patient and I connected via video due to complaints of head congestion and headache that have lingered and progressed for 3 weeks.  Patient states his sinuses just feel full and he cannot get them to drain.  States when he blows his nose it will be thick yellow in color.  Denies any cough, chest pain shortness of breath or wheezing.  Denies fever or chills.  Denies body aches.  States he has not left the home much in the past 12 weeks.  States he and his wife are diligent about their handwashing.  Patient and wife also report on occasion that his ankles will become swollen, left seems to swell more than right.  Patient is already anticoagulated on Coumadin.  Patient wonders if the swelling is a due to him not being as active and not going out and walking is much as he normally  would due to the pandemic.  Patient and wife state once he elevated legs the swelling seemed to improve.  Patient does follow regularly with cardiology and vascular due to his complicated cardiac and vascular issues.  ROS: See pertinent positives and negatives per HPI.  Past Medical History:  Diagnosis Date   Aortic aneurysm, thoracic (HCC)    Aortic valve disease    Arthritis    BPH (benign prostatic hyperplasia)    COPD (chronic obstructive pulmonary disease) (HCC)    GERD (gastroesophageal reflux disease)    HTN (hypertension)    Hypercholesterolemia    Stroke (HCC)    TIA (transient ischemic attack)     Past Surgical History:  Procedure Laterality Date   ACHILLES TENDON REPAIR     AORTIC VALVE REPLACEMENT     #23 On-X valve conduit   ASCENDING AORTIC ANEURYSM REPAIR     BACK SURGERY     FOOT SURGERY      Family History  Problem Relation Age of Onset   Alzheimer's disease Father    Diabetes Father        age onset DM   Hypertension Sister    Hypertension Brother    Diabetes Mother    Diabetes Sister    Colon cancer Neg Hx    Social History   Tobacco Use   Smoking status: Never Smoker   Smokeless tobacco: Never Used  Substance Use Topics   Alcohol use: No    Alcohol/week: 0.0 standard drinks  Current Outpatient Medications:    albuterol (VENTOLIN HFA) 108 (90 Base) MCG/ACT inhaler, Inhale 2 puffs into the lungs every 4 (four) hours as needed for wheezing or shortness of breath., Disp: 1 Inhaler, Rfl: 3   amLODipine (NORVASC) 5 MG tablet, TAKE 1 TABLET BY MOUTH EVERY DAY, Disp: 90 tablet, Rfl: 1   aspirin 81 MG tablet, Take 81 mg by mouth at bedtime. , Disp: , Rfl:    benzonatate (TESSALON) 100 MG capsule, Take 1 or 2 capsules every 8 hours as needed for cough, Disp: 30 capsule, Rfl: 1   budesonide-formoterol (SYMBICORT) 160-4.5 MCG/ACT inhaler, TAKE 2 PUFFS BY MOUTH TWICE A DAY, Disp: 30.6 Inhaler, Rfl: 4   doxycycline  (VIBRA-TABS) 100 MG tablet, Take 1 tablet (100 mg total) by mouth 2 (two) times daily., Disp: 20 tablet, Rfl: 0   metoprolol succinate (TOPROL-XL) 50 MG 24 hr tablet, Take 1 tablet (50 mg total) by mouth daily. Take with or immediately following a meal., Disp: 90 tablet, Rfl: 1   NON FORMULARY, , Disp: , Rfl:    Polyethylene Glycol 3350 (MIRALAX PO), Take by mouth., Disp: , Rfl:    senna-docusate (SENOKOT-S) 8.6-50 MG tablet, Take by mouth., Disp: , Rfl:    tamsulosin (FLOMAX) 0.4 MG CAPS capsule, TAKE 2 CAPSULES BY MOUTH EVERY DAY, Disp: 174 capsule, Rfl: 1   Tiotropium Bromide Monohydrate (SPIRIVA RESPIMAT) 2.5 MCG/ACT AERS, INHALE 2.5 MCG INTO THE LUNGS 2 (TWO) TIMES DAILY., Disp: 1 Inhaler, Rfl: 5   warfarin (COUMADIN) 2 MG tablet, Take 1 tablet daily as directed with 5 mg tablet, Disp: 90 tablet, Rfl: 0   warfarin (COUMADIN) 5 MG tablet, Take 1 tablet daily as directed with 2 mg tablet, Disp: 90 tablet, Rfl: 0  EXAM:  GENERAL: alert, oriented, appears well and in no acute distress  HEENT: atraumatic, conjunttiva clear, no obvious abnormalities on inspection of external nose and ears. Voice does sound more nasally & congested that usual.   NECK: normal movements of the head and neck  LUNGS: Speaking in full sentences. On inspection no signs of respiratory distress, breathing rate appears normal, no obvious gross SOB, gasping, coughing or wheezing  CV: no obvious cyanosis  MS: moves all visible extremities without noticeable abnormality  PSYCH/NEURO: pleasant and cooperative, no obvious depression or anxiety, speech and thought processing grossly intact  ASSESSMENT AND PLAN:  Discussed the following assessment and plan:  Acute sinusitis, recurrence not specified, unspecified location - Plan: doxycycline (VIBRA-TABS) 100 MG tablet  Leg swelling - Intermittent, usually will improve with leg elevation - Plan: furosemide (LASIX) 20 MG tablet  Long term current use of  anticoagulant therapy  Patient symptoms do sound consistent with a sinusitis infection.  Due to the congestion being present for 3 weeks and him not being out of the home and not being exposed to anyone who has been under suspicion for a positive for COVID-19 I do not have strong suspicion for COVID-19 and patient would prefer not to have to go get tested.  Patient will take doxycycline twice daily for 10 days to treat sinusitis and also he will use his Flonase nasal spray to help reduce sinus congestion.  Patient does check his INR weekly at home and reports his readings, advised patient when he reports his next reading to be sure and let them know that he has just taken an antibiotic so they can adjust his dose accordingly.  Intermittent leg swelling could be related to patient's complicated cardiac issues causing  some fluid overload.  The swelling in the legs does seem to respond to leg elevation, so advised whenever he is able to elevate legs.  Patient advised that I will send in some Lasix to use as needed for days where the swelling seems to be worse and is not responsive to the elevation.  Also advised patient to reach out to his cardiologist and report the swelling in the legs that is happening on occasion to be sure there is nothing else that we need to do to further investigate.   I discussed the assessment and treatment plan with the patient. The patient was provided an opportunity to ask questions and all were answered. The patient agreed with the plan and demonstrated an understanding of the instructions.   The patient was advised to call back or seek an in-person evaluation if the symptoms worsen or if the condition fails to improve as anticipated.  Patient will otherwise keep regularly scheduled follow-up with PCP and specialist as planned.  He is aware he can call office anytime with questions or concerns.  Jodelle Green, FNP

## 2018-07-15 ENCOUNTER — Ambulatory Visit (INDEPENDENT_AMBULATORY_CARE_PROVIDER_SITE_OTHER): Payer: Medicare Other | Admitting: Pharmacist

## 2018-07-15 DIAGNOSIS — Z7901 Long term (current) use of anticoagulants: Secondary | ICD-10-CM

## 2018-07-15 DIAGNOSIS — I359 Nonrheumatic aortic valve disorder, unspecified: Secondary | ICD-10-CM | POA: Diagnosis not present

## 2018-07-25 LAB — POCT INR: INR: 2.5 (ref 2.0–3.0)

## 2018-07-28 ENCOUNTER — Ambulatory Visit (INDEPENDENT_AMBULATORY_CARE_PROVIDER_SITE_OTHER): Payer: Medicare Other | Admitting: Internal Medicine

## 2018-07-28 DIAGNOSIS — Z7901 Long term (current) use of anticoagulants: Secondary | ICD-10-CM | POA: Diagnosis not present

## 2018-07-28 DIAGNOSIS — I359 Nonrheumatic aortic valve disorder, unspecified: Secondary | ICD-10-CM

## 2018-08-05 ENCOUNTER — Other Ambulatory Visit: Payer: Self-pay | Admitting: Family Medicine

## 2018-08-05 DIAGNOSIS — M7989 Other specified soft tissue disorders: Secondary | ICD-10-CM

## 2018-08-09 LAB — POCT INR: INR: 3.1 — AB (ref 2.0–3.0)

## 2018-08-10 ENCOUNTER — Ambulatory Visit (INDEPENDENT_AMBULATORY_CARE_PROVIDER_SITE_OTHER): Payer: Medicare Other | Admitting: Pharmacist

## 2018-08-10 DIAGNOSIS — Z7901 Long term (current) use of anticoagulants: Secondary | ICD-10-CM | POA: Diagnosis not present

## 2018-08-10 DIAGNOSIS — I359 Nonrheumatic aortic valve disorder, unspecified: Secondary | ICD-10-CM

## 2018-08-17 ENCOUNTER — Ambulatory Visit (INDEPENDENT_AMBULATORY_CARE_PROVIDER_SITE_OTHER): Payer: Medicare Other | Admitting: Pharmacist Clinician (PhC)/ Clinical Pharmacy Specialist

## 2018-08-17 DIAGNOSIS — I639 Cerebral infarction, unspecified: Secondary | ICD-10-CM

## 2018-08-17 DIAGNOSIS — I359 Nonrheumatic aortic valve disorder, unspecified: Secondary | ICD-10-CM

## 2018-08-17 DIAGNOSIS — Z7901 Long term (current) use of anticoagulants: Secondary | ICD-10-CM | POA: Diagnosis not present

## 2018-08-17 LAB — POCT INR: INR: 2.9 (ref 2.0–3.0)

## 2018-08-24 ENCOUNTER — Ambulatory Visit (INDEPENDENT_AMBULATORY_CARE_PROVIDER_SITE_OTHER): Payer: Medicare Other | Admitting: Pharmacist Clinician (PhC)/ Clinical Pharmacy Specialist

## 2018-08-24 DIAGNOSIS — Z7901 Long term (current) use of anticoagulants: Secondary | ICD-10-CM | POA: Diagnosis not present

## 2018-08-24 DIAGNOSIS — Z952 Presence of prosthetic heart valve: Secondary | ICD-10-CM | POA: Diagnosis not present

## 2018-08-24 DIAGNOSIS — I639 Cerebral infarction, unspecified: Secondary | ICD-10-CM | POA: Diagnosis not present

## 2018-08-24 DIAGNOSIS — I359 Nonrheumatic aortic valve disorder, unspecified: Secondary | ICD-10-CM

## 2018-08-24 LAB — POCT INR: INR: 3.2 — AB (ref 2.0–3.0)

## 2018-08-27 ENCOUNTER — Other Ambulatory Visit: Payer: Self-pay | Admitting: Cardiology

## 2018-08-29 ENCOUNTER — Other Ambulatory Visit: Payer: Self-pay | Admitting: Family Medicine

## 2018-08-29 DIAGNOSIS — M7989 Other specified soft tissue disorders: Secondary | ICD-10-CM

## 2018-08-30 ENCOUNTER — Ambulatory Visit (INDEPENDENT_AMBULATORY_CARE_PROVIDER_SITE_OTHER): Payer: Medicare Other | Admitting: Pharmacist

## 2018-08-30 MED ORDER — ENOXAPARIN SODIUM 100 MG/ML ~~LOC~~ SOLN: 100.0000 mg | Freq: Two times a day (BID) | SUBCUTANEOUS | 0 refills | Status: DC

## 2018-08-31 ENCOUNTER — Ambulatory Visit (INDEPENDENT_AMBULATORY_CARE_PROVIDER_SITE_OTHER): Payer: Medicare Other | Admitting: Pharmacist Clinician (PhC)/ Clinical Pharmacy Specialist

## 2018-08-31 DIAGNOSIS — Z7901 Long term (current) use of anticoagulants: Secondary | ICD-10-CM

## 2018-08-31 DIAGNOSIS — I359 Nonrheumatic aortic valve disorder, unspecified: Secondary | ICD-10-CM

## 2018-08-31 LAB — POCT INR: INR: 2.2 (ref 2.0–3.0)

## 2018-09-01 ENCOUNTER — Other Ambulatory Visit: Payer: Self-pay | Admitting: Family Medicine

## 2018-09-01 DIAGNOSIS — M7989 Other specified soft tissue disorders: Secondary | ICD-10-CM

## 2018-09-03 ENCOUNTER — Telehealth: Payer: Self-pay | Admitting: Pharmacist Clinician (PhC)/ Clinical Pharmacy Specialist

## 2018-09-03 NOTE — Telephone Encounter (Signed)
Patient called this am.  Noted that he took lovenox last night in error.  Has a heart cath this am at [facility]  Last dose lovenox was at 9 pm, is scheduled to check in at [facility] at 9 am today.   INR 1.2 this am (self tester).  Advised patient to be sure and let the staff at [facility] know when his last lovenox dose was.  Patient voiced understanding.

## 2018-09-07 ENCOUNTER — Ambulatory Visit (INDEPENDENT_AMBULATORY_CARE_PROVIDER_SITE_OTHER): Payer: Medicare Other | Admitting: Cardiovascular Disease

## 2018-09-07 DIAGNOSIS — Z7901 Long term (current) use of anticoagulants: Secondary | ICD-10-CM

## 2018-09-07 DIAGNOSIS — I359 Nonrheumatic aortic valve disorder, unspecified: Secondary | ICD-10-CM

## 2018-09-07 LAB — POCT INR: INR: 1.4 — AB (ref 2.0–3.0)

## 2018-09-09 ENCOUNTER — Ambulatory Visit (INDEPENDENT_AMBULATORY_CARE_PROVIDER_SITE_OTHER): Payer: Medicare Other | Admitting: Cardiology

## 2018-09-09 DIAGNOSIS — Z7901 Long term (current) use of anticoagulants: Secondary | ICD-10-CM | POA: Diagnosis not present

## 2018-09-09 DIAGNOSIS — I359 Nonrheumatic aortic valve disorder, unspecified: Secondary | ICD-10-CM

## 2018-09-09 LAB — POCT INR: INR: 1.8 — AB (ref 2.0–3.0)

## 2018-09-10 ENCOUNTER — Other Ambulatory Visit: Payer: Self-pay

## 2018-09-10 ENCOUNTER — Encounter: Payer: Self-pay | Admitting: Family Medicine

## 2018-09-10 ENCOUNTER — Ambulatory Visit (INDEPENDENT_AMBULATORY_CARE_PROVIDER_SITE_OTHER): Payer: Medicare Other | Admitting: Family Medicine

## 2018-09-10 ENCOUNTER — Telehealth: Payer: Self-pay | Admitting: Family Medicine

## 2018-09-10 DIAGNOSIS — I7101 Dissection of thoracic aorta: Secondary | ICD-10-CM | POA: Diagnosis not present

## 2018-09-10 DIAGNOSIS — I10 Essential (primary) hypertension: Secondary | ICD-10-CM | POA: Diagnosis not present

## 2018-09-10 DIAGNOSIS — J449 Chronic obstructive pulmonary disease, unspecified: Secondary | ICD-10-CM | POA: Diagnosis not present

## 2018-09-10 DIAGNOSIS — I7123 Aneurysm of the descending thoracic aorta, without rupture: Secondary | ICD-10-CM

## 2018-09-10 MED ORDER — ALBUTEROL SULFATE HFA 108 (90 BASE) MCG/ACT IN AERS: 2.0000 | INHALATION_SPRAY | RESPIRATORY_TRACT | 1 refills | Status: DC | PRN

## 2018-09-10 NOTE — Assessment & Plan Note (Signed)
He is scheduled for surgery in October.  He will continue to follow with his cardiothoracic surgeon.  I will have somebody contact cardiothoracic surgeon's office to check on the status of his pulmonary rehab.  Discussed reasons for him to seek medical attention in the emergency department.

## 2018-09-10 NOTE — Assessment & Plan Note (Signed)
Dyspnea is likely related to his COPD.  He will continue Spiriva and Symbicort.  Discussed that we will touch base with his cardiothoracic surgeon's office and check on status of patient's pulmonary rehab.

## 2018-09-10 NOTE — Assessment & Plan Note (Signed)
Well-controlled.  Continue current regimen. 

## 2018-09-10 NOTE — Progress Notes (Signed)
Virtual Visit via video Note  This visit type was conducted due to national recommendations for restrictions regarding the COVID-19 pandemic (e.g. social distancing).  This format is felt to be most appropriate for this patient at this time.  All issues noted in this document were discussed and addressed.  No physical exam was performed (except for noted visual exam findings with Video Visits).   I connected with Alexander Barton today at  1:15 PM EDT by a video enabled telemedicine application and verified that I am speaking with the correct person using two identifiers. Location patient: home Location provider: work Persons participating in the virtual visit: patient, provider, wife [name] Kissel)  I discussed the limitations, risks, security and privacy concerns of performing an evaluation and management service by telephone and the availability of in person appointments. I also discussed with the patient that there may be a patient responsible charge related to this service. The patient expressed understanding and agreed to proceed.   Reason for visit: follow-up  HPI: Aortic dissection: Patient continues to follow with cardiothoracic surgery.  He had a CT scan recently that he reports showed extension of the dissection and they are planning on doing a two-step surgery in October and November.  He had a catheterization that revealed a 60% lesion in OM1 with no intervention undertaken.  He notes no significant chest pain.  He occasionally will have a sharp brief discomfort that goes away quickly.  No lingering pain.  Hypertension: BP has been 100s over 60s.  Taking amlodipine and metoprolol.  No chest pressure.  Chronic dyspnea on exertion related to COPD.  COPD: Patient has continued to have chronic dyspnea on exertion related to this.  He was supposed to undergo pulmonary rehab as advised by his cardiothoracic surgeon though he has not heard anything regarding this.  He continues on  Spiriva and Symbicort.  No cough or wheezing.  He has had to use his albuterol inhaler a couple of times though on questioning today it has been expired since earlier this year.   ROS: See pertinent positives and negatives per HPI.  Past Medical History:  Diagnosis Date  . Aortic aneurysm, thoracic ([place])   . Aortic valve disease   . Arthritis   . BPH (benign prostatic hyperplasia)   . COPD (chronic obstructive pulmonary disease) ([place])   . GERD (gastroesophageal reflux disease)   . HTN (hypertension)   . Hypercholesterolemia   . Stroke ([place])   . TIA (transient ischemic attack)     Past Surgical History:  Procedure Laterality Date  . ACHILLES TENDON REPAIR    . AORTIC VALVE REPLACEMENT     #23 On-X valve conduit  . ASCENDING AORTIC ANEURYSM REPAIR    . BACK SURGERY    . FOOT SURGERY      Family History  Problem Relation Age of Onset  . Alzheimer's disease Father   . Diabetes Father        age onset DM  . Hypertension Sister   . Hypertension Brother   . Diabetes Mother   . Diabetes Sister   . Colon cancer Neg Hx     SOCIAL HX: Non-smoker.   Current Outpatient Medications:  .  albuterol (VENTOLIN HFA) 108 (90 Base) MCG/ACT inhaler, Inhale 2 puffs into the lungs every 4 (four) hours as needed for wheezing or shortness of breath., Disp: 18 g, Rfl: 1 .  amLODipine (NORVASC) 5 MG tablet, TAKE 1 TABLET BY MOUTH EVERY DAY, Disp: 90 tablet, Rfl:  1 .  aspirin 81 MG tablet, Take 81 mg by mouth at bedtime. , Disp: , Rfl:  .  benzonatate (TESSALON) 100 MG capsule, Take 1 or 2 capsules every 8 hours as needed for cough, Disp: 30 capsule, Rfl: 1 .  budesonide-formoterol (SYMBICORT) 160-4.5 MCG/ACT inhaler, TAKE 2 PUFFS BY MOUTH TWICE A DAY, Disp: 30.6 Inhaler, Rfl: 4 .  doxycycline (VIBRA-TABS) 100 MG tablet, Take 1 tablet (100 mg total) by mouth 2 (two) times daily., Disp: 20 tablet, Rfl: 0 .  enoxaparin (LOVENOX) 100 MG/ML injection, Inject 1 mL (100 mg total) into the skin every  12 (twelve) hours. As instructed by coumadin clinic, Disp: 20 mL, Rfl: 0 .  furosemide (LASIX) 20 MG tablet, TAKE 1/2 TABLET (10 MG) 1 OR 2 TIMES A DAY AS NEEDED FOR LEG SWELLING, Disp: 90 tablet, Rfl: 1 .  metoprolol succinate (TOPROL-XL) 50 MG 24 hr tablet, Take 1 tablet (50 mg total) by mouth daily. Take with or immediately following a meal., Disp: 90 tablet, Rfl: 1 .  NON FORMULARY, , Disp: , Rfl:  .  Polyethylene Glycol 3350 (MIRALAX PO), Take by mouth., Disp: , Rfl:  .  senna-docusate (SENOKOT-S) 8.6-50 MG tablet, Take by mouth., Disp: , Rfl:  .  tamsulosin (FLOMAX) 0.4 MG CAPS capsule, TAKE 2 CAPSULES BY MOUTH EVERY DAY, Disp: 174 capsule, Rfl: 1 .  Tiotropium Bromide Monohydrate (SPIRIVA RESPIMAT) 2.5 MCG/ACT AERS, INHALE 2.5 MCG INTO THE LUNGS 2 (TWO) TIMES DAILY., Disp: 1 Inhaler, Rfl: 5 .  warfarin (COUMADIN) 2 MG tablet, TAKE 1 TABLET WITH 5 MG TABLET DAILY AS DIRECTED BY COUMADIN CLINIC, Disp: 90 tablet, Rfl: 0 .  warfarin (COUMADIN) 5 MG tablet, TAKE 1 TABLET DAILY AS DIRECTED WITH 2 MG TABLET, Disp: 90 tablet, Rfl: 1  EXAM:  VITALS per patient if applicable: None.  GENERAL: alert, oriented, appears well and in no acute distress  HEENT: atraumatic, conjunttiva clear, no obvious abnormalities on inspection of external nose and ears  NECK: normal movements of the head and neck  LUNGS: on inspection no signs of respiratory distress, breathing rate appears normal, no obvious gross SOB, gasping or wheezing  CV: no obvious cyanosis  MS: moves all visible extremities without noticeable abnormality  PSYCH/NEURO: pleasant and cooperative, no obvious depression or anxiety, speech and thought processing grossly intact  ASSESSMENT AND PLAN:  Discussed the following assessment and plan:  Dissecting aneurysm of thoracic aorta, Stanford type B ([place]) He is scheduled for surgery in October.  He will continue to follow with his cardiothoracic surgeon.  I will have somebody contact  cardiothoracic surgeon's office to check on the status of his pulmonary rehab.  Discussed reasons for him to seek medical attention in the emergency department.  HTN (hypertension) Well-controlled.  Continue current regimen.  Chronic obstructive pulmonary disease (HCC) Dyspnea is likely related to his COPD.  He will continue Spiriva and Symbicort.  Discussed that we will touch base with his cardiothoracic surgeon's office and check on status of patient's pulmonary rehab.   Social distancing precautions and sick precautions given regarding COVID-19.   I discussed the assessment and treatment plan with the patient. The patient was provided an opportunity to ask questions and all were answered. The patient agreed with the plan and demonstrated an understanding of the instructions.   The patient was advised to call back or seek an in-person evaluation if the symptoms worsen or if the condition fails to improve as anticipated.   [name], MD

## 2018-09-11 NOTE — Telephone Encounter (Signed)
This patient reported that he was to be referred to pulmonary rehab by his cardiothoracic surgeons office and he has not heard anything about this yet and he is anxious to get started with this. He wondered if there was anything we could do to help with this. Could you call Dr Mart Piggs office at [facility] and see if there is anything we can do to help facilitate this referral? You could just speak with the clinic RN or staff. Thanks.

## 2018-09-14 ENCOUNTER — Telehealth: Payer: Self-pay | Admitting: Internal Medicine

## 2018-09-14 NOTE — Telephone Encounter (Signed)
Pt stated that he was in for some tests and it was while here they set him up to see [facility] to save him a trip back to the hospital. Per Pt Dr.Sonneberg recommended he start pulmonary rehab. Pt was advised that we have not received anything from [facility] and that he would need to see Dr.Kasa first. Pt then asked if Dr.Sonneberg sent over the recommendation if I could do it then and I explained that I could not and reiterated that he would need to be seen. I explaind that Dr.Kasa in not in today. When asked if he would like to schedule appt. Pt stated that he was going to make some phone calls and call back. I will send this conversation to Dr.Kasa to better advise.

## 2018-09-15 ENCOUNTER — Other Ambulatory Visit: Payer: Self-pay | Admitting: *Deleted

## 2018-09-15 DIAGNOSIS — J449 Chronic obstructive pulmonary disease, unspecified: Secondary | ICD-10-CM

## 2018-09-15 NOTE — Telephone Encounter (Signed)
Lets follow up with patient and see what he decides

## 2018-09-15 NOTE — Telephone Encounter (Signed)
Spoke with pt re potentially setting up appointment for pt to see Dr.Kasa to discuss pulmonary rehab recommendations. Pt stated that he thinks the pulmonologist at [facility] has set that up for him since they have seen him for the issue. Pt will call back for appointment at a later date if needed.

## 2018-09-16 ENCOUNTER — Ambulatory Visit (INDEPENDENT_AMBULATORY_CARE_PROVIDER_SITE_OTHER): Payer: Medicare Other | Admitting: Cardiovascular Disease

## 2018-09-16 DIAGNOSIS — Z7901 Long term (current) use of anticoagulants: Secondary | ICD-10-CM

## 2018-09-16 DIAGNOSIS — I359 Nonrheumatic aortic valve disorder, unspecified: Secondary | ICD-10-CM | POA: Diagnosis not present

## 2018-09-16 LAB — POCT INR: INR: 2.3 (ref 2.0–3.0)

## 2018-09-16 NOTE — Telephone Encounter (Signed)
Called and was informed that a Melissa Burchette facilitates the referrals to pulmonary, I left her a message to return a call to me about the patient and the referral.  Fable Huisman,cma

## 2018-09-16 NOTE — Telephone Encounter (Signed)
I spoke with melissa burchette and she has already handled the referral and spoke to the patient about it on yesterday.  Manly Nestle,cma

## 2018-09-21 ENCOUNTER — Encounter: Payer: Medicare Other | Attending: Family Medicine | Admitting: *Deleted

## 2018-09-21 ENCOUNTER — Other Ambulatory Visit: Payer: Self-pay

## 2018-09-21 DIAGNOSIS — Z7982 Long term (current) use of aspirin: Secondary | ICD-10-CM | POA: Insufficient documentation

## 2018-09-21 DIAGNOSIS — Z79899 Other long term (current) drug therapy: Secondary | ICD-10-CM | POA: Insufficient documentation

## 2018-09-21 DIAGNOSIS — I10 Essential (primary) hypertension: Secondary | ICD-10-CM | POA: Insufficient documentation

## 2018-09-21 DIAGNOSIS — Z7951 Long term (current) use of inhaled steroids: Secondary | ICD-10-CM | POA: Insufficient documentation

## 2018-09-21 DIAGNOSIS — Z7901 Long term (current) use of anticoagulants: Secondary | ICD-10-CM | POA: Insufficient documentation

## 2018-09-21 DIAGNOSIS — J449 Chronic obstructive pulmonary disease, unspecified: Secondary | ICD-10-CM

## 2018-09-21 DIAGNOSIS — E7800 Pure hypercholesterolemia, unspecified: Secondary | ICD-10-CM | POA: Insufficient documentation

## 2018-09-21 DIAGNOSIS — K219 Gastro-esophageal reflux disease without esophagitis: Secondary | ICD-10-CM | POA: Insufficient documentation

## 2018-09-21 DIAGNOSIS — Z8673 Personal history of transient ischemic attack (TIA), and cerebral infarction without residual deficits: Secondary | ICD-10-CM | POA: Insufficient documentation

## 2018-09-21 NOTE — Progress Notes (Signed)
Daily Session Note  Patient Details  Name: Alexander Barton MRN: [id number] Date of Birth: 04-01-1950 Referring Provider:    Encounter Date: 09/21/2018  Check In: Session Check In - 09/21/18 0802      Check-In   Supervising physician immediately available to respond to emergencies  Virtual Visit, No Supervising Physician Required.    Location  [facility]    Staff Present  Alberteen Sam, MA, RCEP, CCRP, CCET    Virtual Visit  Yes      Pain Assessment   Currently in Pain?  No/denies          Social History   Tobacco Use  Smoking Status Never Smoker  Smokeless Tobacco Never Used    Goals Met:  Personal goals reviewed  Goals Unmet:  Not Applicable  Comments: Completed virtual orientation   Dr. Emily Filbert is Medical Director for [employer] and [facility].

## 2018-09-21 NOTE — Progress Notes (Signed)
Alexander Barton Date of Birth: 1950-09-05 Medical Record #[id number]  History of Present Illness: Alexander Barton is seen today for followup of valvular heart disease. He is status post mechanical aortic valve replacement in October 2008 with a #23 mm ON-X mechanical valve conduit with a button Bentall procedure and Hemi arch graft  and is on chronic Coumadin. He had a TIA in January 2012 and was admitted in May 2014 with a right thalamic CVA. He is now on aspirin and Coumadin.  His evaluation in the hospital included an echocardiogram which showed normal valve function. Carotid Dopplers were OK.  He was admitted in May 2018 at [facility] with worsening dyspnea. Seen by our service there. CTA of the chest demonstrated no evidence of PE or significant parenchymal lung disease. There is evidence of prior mechanical AVR and hemi-arch replacement. Coronary artery calcification is also noted as was cholelithiasis.  Myoview study was done and was normal. Echo also done as noted below.   He was admitted in September 2019 at [facility] with acute epigastric, chest, and back pain. Found to have a type B aortic dissection. Managed medically and followed by Alexander Barton at [facility].   More recently he was noted on CT to have further enlargement of his aorta. Alexander Barton is planning to do a total arch replacement with TEVAR of the descending thoracic aorta on October 29. He had recent cardiac evaluation including cardiac cath that showed a 60% stenosis in an OM branch otherwise no obstructive disease. Echo showed normally functioning AV prosthesis and normal LV function. PFTs done with COPD. Planning to do 2 months of pulmonary Rehab prior to surgery.   On follow up today he reports feeling pretty well. He denies any chest pain or SOB. Very mild edema at times. Has lasix to take on a prn basis but hasn't used it.  He is in a clinical trial for cholesterol management through his primary care.   Current  Outpatient Medications on File Prior to Visit  Medication Sig Dispense Refill  . albuterol (VENTOLIN HFA) 108 (90 Base) MCG/ACT inhaler Inhale 2 puffs into the lungs every 4 (four) hours as needed for wheezing or shortness of breath. 18 g 1  . amLODipine (NORVASC) 5 MG tablet TAKE 1 TABLET BY MOUTH EVERY DAY 90 tablet 1  . aspirin 81 MG tablet Take 81 mg by mouth at bedtime.     . benzonatate (TESSALON) 100 MG capsule Take 1 or 2 capsules every 8 hours as needed for cough 30 capsule 1  . budesonide-formoterol (SYMBICORT) 160-4.5 MCG/ACT inhaler TAKE 2 PUFFS BY MOUTH TWICE A DAY 30.6 Inhaler 4  . doxycycline (VIBRA-TABS) 100 MG tablet Take 1 tablet (100 mg total) by mouth 2 (two) times daily. 20 tablet 0  . enoxaparin (LOVENOX) 100 MG/ML injection Inject 1 mL (100 mg total) into the skin every 12 (twelve) hours. As instructed by coumadin clinic 20 mL 0  . furosemide (LASIX) 20 MG tablet TAKE 1/2 TABLET (10 MG) 1 OR 2 TIMES A DAY AS NEEDED FOR LEG SWELLING 90 tablet 1  . metoprolol succinate (TOPROL-XL) 50 MG 24 hr tablet Take 1 tablet (50 mg total) by mouth daily. Take with or immediately following a meal. 90 tablet 1  . NON FORMULARY     . Polyethylene Glycol 3350 (MIRALAX PO) Take by mouth.    . senna-docusate (SENOKOT-S) 8.6-50 MG tablet Take by mouth.    . tamsulosin (FLOMAX) 0.4 MG CAPS capsule TAKE  2 CAPSULES BY MOUTH EVERY DAY 174 capsule 1  . Tiotropium Bromide Monohydrate (SPIRIVA RESPIMAT) 2.5 MCG/ACT AERS INHALE 2.5 MCG INTO THE LUNGS 2 (TWO) TIMES DAILY. 1 Inhaler 5  . warfarin (COUMADIN) 2 MG tablet TAKE 1 TABLET WITH 5 MG TABLET DAILY AS DIRECTED BY COUMADIN CLINIC 90 tablet 0  . warfarin (COUMADIN) 5 MG tablet TAKE 1 TABLET DAILY AS DIRECTED WITH 2 MG TABLET 90 tablet 1   No current facility-administered medications on file prior to visit.     Allergies  Allergen Reactions  . Codeine Anaphylaxis  . Gadolinium Derivatives Other (See Comments)    Other Reaction: intense sneezing  MRI contrast  . Hydrocodone Anaphylaxis  . Iodinated Diagnostic Agents Other (See Comments)    Uncontrollable sneezing, needs sedative the night before and hour and benadryl before using Other Reaction: sneezing with SOB after prep  . Statins Other (See Comments)    Muscle spasm Muscle spasm  Muscle spasm  Myalgias  . Hydromorphone Other (See Comments)    Pt says he has never taken  . Lipitor [Atorvastatin Calcium] Other (See Comments)    Myalgias   . Other     Hydromorphone  . Seroquel [Quetiapine Fumerate] Other (See Comments)    "bad trip" tongue swell    Past Medical History:  Diagnosis Date  . Aortic aneurysm, thoracic ([place])   . Aortic valve disease   . Arthritis   . BPH (benign prostatic hyperplasia)   . COPD (chronic obstructive pulmonary disease) ([place])   . GERD (gastroesophageal reflux disease)   . HTN (hypertension)   . Hypercholesterolemia   . Stroke ([place])   . TIA (transient ischemic attack)     Past Surgical History:  Procedure Laterality Date  . ACHILLES TENDON REPAIR    . AORTIC VALVE REPLACEMENT     #23 On-X valve conduit  . ASCENDING AORTIC ANEURYSM REPAIR    . BACK SURGERY    . FOOT SURGERY      Social History   Tobacco Use  Smoking Status Never Smoker  Smokeless Tobacco Never Used    Social History   Substance and Sexual Activity  Alcohol Use No  . Alcohol/week: 0.0 standard drinks    Family History  Problem Relation Age of Onset  . Alzheimer's disease Father   . Diabetes Father        age onset DM  . Hypertension Sister   . Hypertension Brother   . Diabetes Mother   . Diabetes Sister   . Colon cancer Neg Hx     Review of Systems: As noted in history of present illness.  All other systems were reviewed and are negative.  Physical Exam: BP 102/72   Pulse 79   Temp (!) 96.8 F (36 C)   Ht 6\' 1"  (1.854 m)   Wt 219 lb 14.4 oz (99.7 kg)   SpO2 95%   BMI 29.01 kg/m  GENERAL:  Well appearing WM HEENT:  PERRL, EOMI,  sclera are clear. Oropharynx is clear. NECK:  No jugular venous distention, carotid upstroke brisk and symmetric, no bruits, no thyromegaly or adenopathy LUNGS:  Clear to auscultation bilaterally CHEST:  There is tenderness to palpation at the Xyphoid process.  HEART:  RRR,  PMI not displaced or sustained, loud mechanical AV click,  no S3, no S4: no clicks, no rubs, no murmurs ABD:  Soft, nontender. BS +, no masses or bruits. No hepatomegaly, no splenomegaly EXT:  2 + pulses throughout, no edema, no  cyanosis no clubbing SKIN:  Warm and dry.  No rashes NEURO:  Alert and oriented x 3. Cranial nerves II through XII intact. PSYCH:  Cognitively intact   LABORATORY DATA:  Lab Results  Component Value Date   WBC 4.8 12/11/2017   HGB 12.9 (L) 12/11/2017   HCT 36.6 (L) 12/11/2017   PLT 249 12/11/2017   GLUCOSE 138 (H) 11/16/2017   CHOL 253 (H) 12/10/2016   TRIG 112.0 12/10/2016   HDL 45.00 12/10/2016   LDLDIRECT 172.0 11/14/2014   LDLCALC 185 (H) 12/10/2016   ALT 21 10/20/2017   AST 30 10/20/2017   NA 137 11/16/2017   K 4.4 11/16/2017   CL 103 11/16/2017   CREATININE 1.21 11/16/2017   BUN 15 11/16/2017   CO2 28 11/16/2017   TSH 2.32 01/04/2018   PSA 0.34 11/14/2014   INR 2.3 09/16/2018   HGBA1C 6.1 12/22/2016    Ecg today shows NSR with PVCs. Rate 80. Otherwise normal. I have personally reviewed and interpreted this study.  Echo 06/25/16: Study Conclusions  - Procedure narrative: Transthoracic echocardiography. The study   was technically difficult. - Left ventricle: The cavity size was normal. There was mild   concentric hypertrophy. Systolic function was mildly reduced. The   estimated ejection fraction was in the range of 45% to 50%.   Doppler parameters are consistent with abnormal left ventricular   relaxation (grade 1 diastolic dysfunction). - Aortic valve: A mechanical prosthesis was present and functioning   normally. Valve area (Vmax): 1.87 cm^2. - Mitral valve:  There was mild regurgitation. - Left atrium: The atrium was moderately dilated. - Right atrium: The atrium was mildly dilated. - Pulmonary arteries: Systolic pressure was within the normal   range.  Myoview 06/25/16: Pharmacological myocardial perfusion imaging study with no significant  ischemia Normal wall motion, EF estimated at 50% No EKG changes concerning for ischemia at peak stress or in recovery. Low risk scan   Signed, Esmond Plants, MD, Ph.D [facility]  Procedure: CTA Chest with and without contrast Procedure: CTA Abdomen and pelvis with and without contrast  Indication: Thoracic aortic aneurysm (TAA), known, follow up, Z95.2 Presence of prosthetic heart valve, Z79.01 Long term (current) use of anticoagulants, I71.4 Abdominal aortic aneurysm, without rupture (CMS-HCC)  Comparison: TAA 10/22/2017  Technique: TAA protocol, Helical high pitch non ECG gated dual source. Volumetric pre and post contrasted, arterial only, images through the chest, abdomen and pelvis were obtained. This study was acquired following the IV administration of iodinated contrast material, given the patient's indications for the examination. If IV contrast material had not been administered, the likelihood of detecting abnormalities relevant to the patient's condition would have been substantially decreased.   Complex image reconstruction post processing was likewise performed as indicated to increase the sensitivity of detecting clinically relevant pathology. Evaluation of solid organs may be limited due dedicated imaging technique for vascular processes as per indication for examination.  Findings: CTA Chest, abdomen, and pelvis: Aorta: Redemonstrated are postsurgical changes of aortic valve replacement and ascending thoracic aortic repair, with coronary reimplantation. Increased size of remaining dissection, with maximum site of dilation in the proximal descending thoracic aorta  measuring 5.4 x 4.8 cm, prior 4.1 x 4.3 cm, but increased extent of residual dissection which extends from the proximal aspect of the aortic arch, to the left common iliac artery, with the involvement of the infrarenal abdominal aorta and proximal left common iliac artery new from prior. Persistent opacification of the false lumen throughout its  entire length. The celiac trunk and SMA are patent, the area of rise predominantly from the false lumen and there is a small dissection flap in the proximal stent unchanged from prior. The right renal artery, left renal artery, and the IMA arise from the true lumen and are patent and normal in size without dissection. Suprarenal and infrarenal abdominal aorta are patent. The infrarenal and iliac arterial system are patent and normal in size.  Left common carotid: Patent and normal in size without significant atherosclerosis  Right common carotid: Patent and normal in size without significant atherosclerosis  Left vertebral artery: Patent and normal in size without significant atherosclerosis  Right vertebral artery: Patent and normal in size without significant atherosclerosis  Left subclavian artery: Patent and normal in size without significant atherosclerosis. The dissection extends into the proximal aspect. Right subclavian artery: Patent and normal in size without significant atherosclerosis    CT Chest, Abdomen and Pelvis:  Visualized portion of the thyroid is unchanged 9 mm hypodense lesion of the right thyroid lobe. No mediastinal, hilar, or axillary lymphadenopathy. There is biatrial enlargement. Interatrial septal aneurysm noted. The pulmonary arteries are of normal size and configuration. There are mild coronary artery atherosclerotic calcifications in the distal left main, proximal LAD and circumflex coronary arteries. No pericardial effusion.  The central airways are patent. Linear atelectasis or scar in the anterior right  upper lobe, in the bilateral lung bases left greater than right. There are multiple scattered calcified granulomas. Second simulation the left major fissure. 2 mm solid nodule in the left lower lobe on image 53, is unchanged from prior. No pleural effusion.  Limited evaluation of the abdominal pelvic viscera due to arterial phase of contrast. Heterogenous liver. Cholelithiasis. There is no biliary ductal dilation. Normal pancreas, spleen, bilateral adrenal glands, and the left kidney. Unchanged synovitic cyst at the inferior pole of the right kidney measuring 3.3 cm. Bilateral extrarenal pelvis is underdistended bladder process is present. There is no focal abnormal bowel wall thickening or dilation. Normal appendix. There is no retroperitoneal or mesenteric lymphadenopathy. Abdominal wall soft tissues are unremarkable. Bilateral L5 pars interarticularis defects. No aggressive osseous lesions.    Impression: 1. Postsurgical changes of ascending thoracic aortic repair and aortic valve replacement with coronary implantation (Bentall procedure).  2. Increased size and extent of remaining thoracic aortic dissection. Maximum site of dilation in the proximal descending thoracic aorta measures 5.5 x 5.2 cm, prior 4.3 x 4.0 cm. The residual dissection extends from the thoracic aortic arch to the left common iliac artery, with new involvement of the infrarenal abdominal aorta and left common iliac artery are new compared to prior.  3. Multiple solid nodules, many of which are calcified, are stable compared to most recent prior, but new compared to 2010. These may represent the sequela of granulomatous disease. If the patient is high risk for lung cancer consider 12 month follow-up CT. (Fleischner Society guidelines 2017).  4. Interatrial septal aneurysm, which can be associated with PFO.  Electronically Reviewed by: Tora Perches, MD, [facility] Electronically Reviewed on:  11/23/2017 4:33 PM  I have reviewed the images and concur with the above findings.  Electronically Signed by: Tereasa Coop, MD, [facility] Electronically Signed on: 11/24/2017 12:12 PM  Other Result Information  Interface, Rad Results In - 11/24/2017 12:13 PM EDT Procedure: CTA Chest with and without contrast Procedure: CTA Abdomen and pelvis with and without contrast  Indication: Thoracic aortic aneurysm (TAA), known, follow up, Z95.2 Presence of prosthetic heart  valve, Z79.01 Long term (current) use of anticoagulants, I71.4 Abdominal aortic aneurysm, without rupture (CMS-HCC)  Comparison: TAA 10/22/2017  Technique:  TAA protocol, Helical high pitch non ECG gated dual source. Volumetric pre and post contrasted, arterial only, images through the chest, abdomen and pelvis were obtained. This study was acquired following the IV administration of iodinated contrast material, given the patient's indications for the examination.  If IV contrast material had not been administered, the likelihood of detecting abnormalities relevant to the patient's condition would have been substantially decreased.   Complex image reconstruction post processing was likewise performed as indicated to increase the sensitivity of detecting clinically relevant pathology. Evaluation of solid organs may be limited due dedicated imaging technique for vascular processes as per indication for examination.  Findings: CTA Chest, abdomen, and pelvis: Aorta: Redemonstrated are postsurgical changes of aortic valve replacement and ascending thoracic aortic repair, with coronary reimplantation. Increased size of remaining dissection, with maximum site of dilation in the proximal descending thoracic aorta measuring 5.4 x 4.8 cm, prior 4.1 x 4.3 cm, but increased extent of residual dissection which extends from the proximal aspect of the aortic arch, to the left common iliac artery, with the involvement of the  infrarenal abdominal aorta and proximal left common iliac artery new from prior. Persistent opacification of the false lumen throughout its entire length. The celiac trunk and SMA are patent, the area of rise predominantly from the false lumen and there is a small dissection flap in the proximal stent unchanged from prior. The right renal artery, left renal artery, and the IMA arise from the true lumen and are patent and normal in size without dissection. Suprarenal and infrarenal abdominal aorta are patent. The infrarenal and iliac arterial system are patent and normal in size.  Left common carotid: Patent and normal in size without significant atherosclerosis  Right common carotid: Patent and normal in size without significant atherosclerosis  Left vertebral artery: Patent and normal in size without significant atherosclerosis  Right vertebral artery: Patent and normal in size without significant atherosclerosis  Left subclavian artery: Patent and normal in size without significant atherosclerosis. The dissection extends into the proximal aspect. Right subclavian artery: Patent and normal in size without significant atherosclerosis    CT Chest, Abdomen and Pelvis:  Visualized portion of the thyroid is unchanged 9 mm hypodense lesion of the right thyroid lobe. No mediastinal, hilar, or axillary lymphadenopathy. There is biatrial enlargement. Interatrial septal aneurysm noted. The pulmonary arteries are of normal size and configuration. There are mild coronary artery atherosclerotic calcifications in the distal left main, proximal LAD and circumflex coronary arteries.  No pericardial effusion.  The central airways are patent. Linear atelectasis or scar in the anterior right upper lobe, in the bilateral lung bases left greater than right. There are multiple scattered calcified granulomas. Second simulation the left major fissure. 2 mm solid nodule in the left lower lobe on image  53, is unchanged from prior. No pleural effusion.  Limited evaluation of the abdominal pelvic viscera due to arterial phase of contrast. Heterogenous liver. Cholelithiasis. There is no biliary ductal dilation. Normal pancreas, spleen, bilateral adrenal glands, and the left kidney. Unchanged synovitic cyst at the inferior pole of the right kidney measuring 3.3 cm. Bilateral extrarenal pelvis is underdistended bladder process is present. There is no focal abnormal bowel wall thickening or dilation. Normal appendix. There is no retroperitoneal or mesenteric lymphadenopathy. Abdominal wall soft tissues are unremarkable. Bilateral L5 pars interarticularis defects. No aggressive osseous lesions.  Impression: 1. Postsurgical changes of ascending thoracic aortic repair and aortic valve replacement with coronary implantation (Bentall procedure).  2. Increased size and extent of remaining thoracic aortic dissection. Maximum site of dilation in the proximal descending thoracic aorta measures 5.5 x 5.2 cm, prior 4.3 x 4.0 cm. The residual dissection extends from the thoracic aortic arch to the left common iliac artery, with new involvement of the infrarenal abdominal aorta and left common iliac artery are new compared to prior.  3. Multiple solid nodules, many of which are calcified, are stable compared to most recent prior, but new compared to 2010. These may represent the sequela of granulomatous disease. If the patient is high risk for lung cancer consider 12 month follow-up CT. (Fleischner Society guidelines 2017).  4. Interatrial septal aneurysm, which can be associated with PFO.  Electronically Reviewed by:  Tora Perches, MD, [facility] Electronically Reviewed on:  11/23/2017 4:33 PM  I have reviewed the images and concur with the above findings.  Electronically Signed by:  Tereasa Coop, MD, [facility] Electronically Signed on:  11/24/2017 12:12 PM     Assessment /  Plan: 1. Status post mechanical aortic valve replacement with Bentall procedure. Continue anticoagulation with Coumadin. Goal INR 2-2.5 with recent dissection. Echo in recently showed normal valve function. Valve exam is normal.  2. Type B Aortic dissection. Diagnosed in September 2019. Followed by Alexander Barton at [facility]. Now with further aneurysmal dilation. Plan for total arch replacement and TEVAR procedure on October 29.  3. Hypertension-controlled 4. Status post CVA/right thalamic. Continue combined aspirin and Coumadin therapy.  5. Hypercholesterolemia. History of intolerance to lipitor and crestor. Now participating in clinical trial thru primary care.  6. COPD planning to do pulmonary Rehab.  Follow up in 6 months.

## 2018-09-22 ENCOUNTER — Ambulatory Visit (INDEPENDENT_AMBULATORY_CARE_PROVIDER_SITE_OTHER): Payer: Medicare Other | Admitting: Cardiology

## 2018-09-22 ENCOUNTER — Encounter: Payer: Self-pay | Admitting: Cardiology

## 2018-09-22 VITALS — Ht 73.25 in | Wt 216.9 lb

## 2018-09-22 VITALS — BP 102/72 | HR 79 | Temp 96.8°F | Ht 73.0 in | Wt 219.9 lb

## 2018-09-22 DIAGNOSIS — Z952 Presence of prosthetic heart valve: Secondary | ICD-10-CM

## 2018-09-22 DIAGNOSIS — K219 Gastro-esophageal reflux disease without esophagitis: Secondary | ICD-10-CM | POA: Diagnosis not present

## 2018-09-22 DIAGNOSIS — E7800 Pure hypercholesterolemia, unspecified: Secondary | ICD-10-CM | POA: Diagnosis not present

## 2018-09-22 DIAGNOSIS — I7103 Dissection of thoracoabdominal aorta: Secondary | ICD-10-CM

## 2018-09-22 DIAGNOSIS — I712 Thoracic aortic aneurysm, without rupture, unspecified: Secondary | ICD-10-CM

## 2018-09-22 DIAGNOSIS — Z7982 Long term (current) use of aspirin: Secondary | ICD-10-CM | POA: Diagnosis not present

## 2018-09-22 DIAGNOSIS — Z7901 Long term (current) use of anticoagulants: Secondary | ICD-10-CM | POA: Diagnosis not present

## 2018-09-22 DIAGNOSIS — Z7951 Long term (current) use of inhaled steroids: Secondary | ICD-10-CM | POA: Diagnosis not present

## 2018-09-22 DIAGNOSIS — Z8673 Personal history of transient ischemic attack (TIA), and cerebral infarction without residual deficits: Secondary | ICD-10-CM | POA: Diagnosis not present

## 2018-09-22 DIAGNOSIS — J449 Chronic obstructive pulmonary disease, unspecified: Secondary | ICD-10-CM | POA: Diagnosis not present

## 2018-09-22 DIAGNOSIS — Z79899 Other long term (current) drug therapy: Secondary | ICD-10-CM | POA: Diagnosis not present

## 2018-09-22 DIAGNOSIS — I10 Essential (primary) hypertension: Secondary | ICD-10-CM

## 2018-09-22 NOTE — Patient Instructions (Signed)
Patient Instructions  Patient Details  Name: Alexander Barton MRN: [id number] Date of Birth: 1950/07/22 Referring Provider:  Leone Haven, MD  Below are your personal goals for exercise, nutrition, and risk factors. Our goal is to help you stay on track towards obtaining and maintaining these goals. We will be discussing your progress on these goals with you throughout the program.  Initial Exercise Prescription: Initial Exercise Prescription - 09/22/18 1200      Date of Initial Exercise RX and Referring Provider   Date  09/22/18    Referring Provider  Sonnenberg      Treadmill   MPH  3.3    Grade  1    Minutes  15    METs  4      Elliptical   Level  1    Speed  3    Minutes  15      REL-XR   Level  3    Speed  50    Minutes  15    METs  4      T5 Nustep   Level  3    SPM  80    Minutes  15    METs  4      Prescription Details   Frequency (times per week)  3    Duration  Progress to 30 minutes of continuous aerobic without signs/symptoms of physical distress      Intensity   THRR 40-80% of Max Heartrate  107-136    Ratings of Perceived Exertion  11-15    Perceived Dyspnea  0-4      Progression   Progression  Continue to progress workloads to maintain intensity without signs/symptoms of physical distress.      Resistance Training   Training Prescription  Yes    Weight  5 lb    Reps  10-15       Exercise Goals: Frequency: Be able to perform aerobic exercise two to three times per week in program working toward 2-5 days per week of home exercise.  Intensity: Work with a perceived exertion of 11 (fairly light) - 15 (hard) while following your exercise prescription.  We will make changes to your prescription with you as you progress through the program.   Duration: Be able to do 30 to 45 minutes of continuous aerobic exercise in addition to a 5 minute warm-up and a 5 minute cool-down routine.   Nutrition Goals: Your personal nutrition goals will be  established when you do your nutrition analysis with the dietician.  The following are general nutrition guidelines to follow: Cholesterol < 200mg /day Sodium < 1500mg /day Fiber: Men over 50 yrs - 30 grams per day  Personal Goals: Personal Goals and Risk Factors at Admission - 09/22/18 1235      Core Components/Risk Factors/Patient Goals on Admission    Weight Management  Weight Loss;Yes    Intervention  Weight Management: Develop a combined nutrition and exercise program designed to reach desired caloric intake, while maintaining appropriate intake of nutrient and fiber, sodium and fats, and appropriate energy expenditure required for the weight goal.;Weight Management: Provide education and appropriate resources to help participant work on and attain dietary goals.    Admit Weight  216 lb 14.4 oz (98.4 kg)    Goal Weight: Short Term  219 lb (99.3 kg)    Goal Weight: Long Term  209 lb (94.8 kg)    Expected Outcomes  Short Term: Continue to assess and modify interventions until  short term weight is achieved;Long Term: Adherence to nutrition and physical activity/exercise program aimed toward attainment of established weight goal;Weight Loss: Understanding of general recommendations for a balanced deficit meal plan, which promotes 1-2 lb weight loss per week and includes a negative energy balance of [phone] kcal/d;Understanding recommendations for meals to include 15-35% energy as protein, 25-35% energy from fat, 35-60% energy from carbohydrates, less than 200mg  of dietary cholesterol, 20-35 gm of total fiber daily;Understanding of distribution of calorie intake throughout the day with the consumption of 4-5 meals/snacks    Improve shortness of breath with ADL's  Yes    Intervention  Provide education, individualized exercise plan and daily activity instruction to help decrease symptoms of SOB with activities of daily living.    Expected Outcomes  Short Term: Improve cardiorespiratory fitness to  achieve a reduction of symptoms when performing ADLs;Long Term: Be able to perform more ADLs without symptoms or delay the onset of symptoms    Intervention  Provide education on lifestyle modifcations including regular physical activity/exercise, weight management, moderate sodium restriction and increased consumption of fresh fruit, vegetables, and low fat dairy, alcohol moderation, and smoking cessation.;Monitor prescription use compliance.    Expected Outcomes  Short Term: Continued assessment and intervention until BP is < 140/69mm HG in hypertensive participants. < 130/63mm HG in hypertensive participants with diabetes, heart failure or chronic kidney disease.;Long Term: Maintenance of blood pressure at goal levels.    Intervention  Provide education and support for participant on nutrition & aerobic/resistive exercise along with prescribed medications to achieve LDL 70mg , HDL >40mg .    Expected Outcomes  Short Term: Participant states understanding of desired cholesterol values and is compliant with medications prescribed. Participant is following exercise prescription and nutrition guidelines.;Long Term: Cholesterol controlled with medications as prescribed, with individualized exercise RX and with personalized nutrition plan. Value goals: LDL < 70mg , HDL > 40 mg.       Tobacco Use Initial Evaluation: Social History   Tobacco Use  Smoking Status Never Smoker  Smokeless Tobacco Never Used    Exercise Goals and Review: Exercise Goals    Row Name 09/22/18 1227             Exercise Goals   Increase Physical Activity  Yes       Intervention  Provide advice, education, support and counseling about physical activity/exercise needs.;Develop an individualized exercise prescription for aerobic and resistive training based on initial evaluation findings, risk stratification, comorbidities and participant's personal goals.       Expected Outcomes  Short Term: Attend rehab on a regular basis  to increase amount of physical activity.;Long Term: Add in home exercise to make exercise part of routine and to increase amount of physical activity.;Long Term: Exercising regularly at least 3-5 days a week.       Increase Strength and Stamina  Yes       Intervention  Provide advice, education, support and counseling about physical activity/exercise needs.;Develop an individualized exercise prescription for aerobic and resistive training based on initial evaluation findings, risk stratification, comorbidities and participant's personal goals.       Expected Outcomes  Short Term: Increase workloads from initial exercise prescription for resistance, speed, and METs.;Short Term: Perform resistance training exercises routinely during rehab and add in resistance training at home;Long Term: Improve cardiorespiratory fitness, muscular endurance and strength as measured by increased METs and functional capacity (6MWT)       Able to understand and use rate of perceived exertion (RPE) scale  Yes       Intervention  Provide education and explanation on how to use RPE scale       Expected Outcomes  Short Term: Able to use RPE daily in rehab to express subjective intensity level;Long Term:  Able to use RPE to guide intensity level when exercising independently       Able to understand and use Dyspnea scale  Yes       Intervention  Provide education and explanation on how to use Dyspnea scale       Expected Outcomes  Short Term: Able to use Dyspnea scale daily in rehab to express subjective sense of shortness of breath during exertion;Long Term: Able to use Dyspnea scale to guide intensity level when exercising independently       Knowledge and understanding of Target Heart Rate Range (THRR)  Yes       Intervention  Provide education and explanation of THRR including how the numbers were predicted and where they are located for reference       Expected Outcomes  Short Term: Able to state/look up THRR;Short Term: Able  to use daily as guideline for intensity in rehab;Long Term: Able to use THRR to govern intensity when exercising independently       Able to check pulse independently  Yes       Intervention  Provide education and demonstration on how to check pulse in carotid and radial arteries.;Review the importance of being able to check your own pulse for safety during independent exercise       Expected Outcomes  Short Term: Able to explain why pulse checking is important during independent exercise;Long Term: Able to check pulse independently and accurately       Understanding of Exercise Prescription  Yes       Intervention  Provide education, explanation, and written materials on patient's individual exercise prescription       Expected Outcomes  Short Term: Able to explain program exercise prescription;Long Term: Able to explain home exercise prescription to exercise independently          Copy of goals given to participant.

## 2018-09-22 NOTE — Progress Notes (Signed)
Pulmonary Individual Treatment Plan  Patient Details  Name: Alexander Barton MRN: [id number] Date of Birth: April 25, 1950 Referring Provider:     Cardiac Rehab from 09/22/2018 in [facility] Cardiac and Pulmonary Rehab  Referring Provider  Sonnenberg      Initial Encounter Date:    Cardiac Rehab from 09/22/2018 in [facility] Cardiac and Pulmonary Rehab  Date  09/22/18      Visit Diagnosis: Chronic obstructive pulmonary disease, unspecified COPD type ([place])  Patient's Home Medications on Admission:  Current Outpatient Medications:  .  albuterol (VENTOLIN HFA) 108 (90 Base) MCG/ACT inhaler, Inhale 2 puffs into the lungs every 4 (four) hours as needed for wheezing or shortness of breath., Disp: 18 g, Rfl: 1 .  amLODipine (NORVASC) 5 MG tablet, TAKE 1 TABLET BY MOUTH EVERY DAY, Disp: 90 tablet, Rfl: 1 .  aspirin 81 MG tablet, Take 81 mg by mouth at bedtime. , Disp: , Rfl:  .  benzonatate (TESSALON) 100 MG capsule, Take 1 or 2 capsules every 8 hours as needed for cough, Disp: 30 capsule, Rfl: 1 .  budesonide-formoterol (SYMBICORT) 160-4.5 MCG/ACT inhaler, TAKE 2 PUFFS BY MOUTH TWICE A DAY, Disp: 30.6 Inhaler, Rfl: 4 .  doxycycline (VIBRA-TABS) 100 MG tablet, Take 1 tablet (100 mg total) by mouth 2 (two) times daily., Disp: 20 tablet, Rfl: 0 .  enoxaparin (LOVENOX) 100 MG/ML injection, Inject 1 mL (100 mg total) into the skin every 12 (twelve) hours. As instructed by coumadin clinic, Disp: 20 mL, Rfl: 0 .  furosemide (LASIX) 20 MG tablet, TAKE 1/2 TABLET (10 MG) 1 OR 2 TIMES A DAY AS NEEDED FOR LEG SWELLING, Disp: 90 tablet, Rfl: 1 .  metoprolol succinate (TOPROL-XL) 50 MG 24 hr tablet, Take 1 tablet (50 mg total) by mouth daily. Take with or immediately following a meal., Disp: 90 tablet, Rfl: 1 .  NON FORMULARY, , Disp: , Rfl:  .  Polyethylene Glycol 3350 (MIRALAX PO), Take by mouth., Disp: , Rfl:  .  senna-docusate (SENOKOT-S) 8.6-50 MG tablet, Take by mouth., Disp: , Rfl:  .  tamsulosin (FLOMAX) 0.4  MG CAPS capsule, TAKE 2 CAPSULES BY MOUTH EVERY DAY, Disp: 174 capsule, Rfl: 1 .  Tiotropium Bromide Monohydrate (SPIRIVA RESPIMAT) 2.5 MCG/ACT AERS, INHALE 2.5 MCG INTO THE LUNGS 2 (TWO) TIMES DAILY., Disp: 1 Inhaler, Rfl: 5 .  warfarin (COUMADIN) 2 MG tablet, TAKE 1 TABLET WITH 5 MG TABLET DAILY AS DIRECTED BY COUMADIN CLINIC, Disp: 90 tablet, Rfl: 0 .  warfarin (COUMADIN) 5 MG tablet, TAKE 1 TABLET DAILY AS DIRECTED WITH 2 MG TABLET, Disp: 90 tablet, Rfl: 1  Past Medical History: Past Medical History:  Diagnosis Date  . Aortic aneurysm, thoracic ([place])   . Aortic valve disease   . Arthritis   . BPH (benign prostatic hyperplasia)   . COPD (chronic obstructive pulmonary disease) ([place])   . GERD (gastroesophageal reflux disease)   . HTN (hypertension)   . Hypercholesterolemia   . Stroke ([place])   . TIA (transient ischemic attack)     Tobacco Use: Social History   Tobacco Use  Smoking Status Never Smoker  Smokeless Tobacco Never Used    Labs: Recent Review Flowsheet Data    Labs for ITP Cardiac and Pulmonary Rehab Latest Ref Rng & Units 06/04/2012 11/14/2014 10/03/2015 12/10/2016 12/22/2016   Cholestrol 0 - 200 mg/dL 247(H) 254(H) 174 253(H) -   LDLCALC 0 - 99 mg/dL 169(H) - 87 185(H) -   LDLDIRECT mg/dL - 172.0 - - -  HDL >39.00 mg/dL 45 48.50 47.20 45.00 -   Trlycerides 0.0 - 149.0 mg/dL 165(H) 202.0(H) 195.0(H) 112.0 -   Hemoglobin A1c 4.6 - 6.5 % 5.9(H) - 6.2 - 6.1       Pulmonary Assessment Scores: Pulmonary Assessment Scores    Row Name 09/22/18 1231         ADL UCSD   SOB Score total  62     Rest  1     Walk  2     Stairs  4     Bath  4     Dress  2     Shop  2       CAT Score   CAT Score  14       mMRC Score   mMRC Score  1        UCSD: Self-administered rating of dyspnea associated with activities of daily living (ADLs) 6-point scale (0 = "not at all" to 5 = "maximal or unable to do because of breathlessness")  Scoring Scores range from 0 to 120.   Minimally important difference is 5 units  CAT: CAT can identify the health impairment of COPD patients and is better correlated with disease progression.  CAT has a scoring range of zero to 40. The CAT score is classified into four groups of low (less than 10), medium (10 - 20), high (21-30) and very high (31-40) based on the impact level of disease on health status. A CAT score over 10 suggests significant symptoms.  A worsening CAT score could be explained by an exacerbation, poor medication adherence, poor inhaler technique, or progression of COPD or comorbid conditions.  CAT MCID is 2 points  mMRC: mMRC (Modified Medical Research Council) Dyspnea Scale is used to assess the degree of baseline functional disability in patients of respiratory disease due to dyspnea. No minimal important difference is established. A decrease in score of 1 point or greater is considered a positive change.   Pulmonary Function Assessment:   Exercise Target Goals: Exercise Program Goal: Individual exercise prescription set using results from initial 6 min walk test and THRR while considering  patient's activity barriers and safety.   Exercise Prescription Goal: Initial exercise prescription builds to 30-45 minutes a day of aerobic activity, 2-3 days per week.  Home exercise guidelines will be given to patient during program as part of exercise prescription that the participant will acknowledge.  Activity Barriers & Risk Stratification: Activity Barriers & Cardiac Risk Stratification - 09/21/18 0810      Activity Barriers & Cardiac Risk Stratification   Activity Barriers  Deconditioning;Muscular Weakness;Joint Problems;Other (comment)    Comments  Hx CVA,  r knee pain       6 Minute Walk: 6 Minute Walk    Row Name 09/22/18 1220         6 Minute Walk   Phase  Initial     Distance  1765 feet     Walk Time  6 minutes     # of Rest Breaks  0     MPH  3.34     METS  4.06     RPE  17     Perceived  Dyspnea   3.5     VO2 Peak  14.21     Symptoms  No     Resting HR  78 bpm     Resting BP  102/58     Resting Oxygen Saturation   94 %  Exercise Oxygen Saturation  during 6 min walk  93 %     Max Ex. HR  121 bpm     Max Ex. BP  138/58     2 Minute Post BP  122/58       Interval HR   1 Minute HR  105     2 Minute HR  113     3 Minute HR  116     4 Minute HR  112     5 Minute HR  110     6 Minute HR  121     2 Minute Post HR  97     Interval Heart Rate?  Yes       Interval Oxygen   Interval Oxygen?  Yes     Baseline Oxygen Saturation %  94 %     1 Minute Oxygen Saturation %  93 %     1 Minute Liters of Oxygen  0 L     2 Minute Oxygen Saturation %  94 %     2 Minute Liters of Oxygen  0 L     3 Minute Oxygen Saturation %  94 %     3 Minute Liters of Oxygen  0 L     4 Minute Oxygen Saturation %  94 %     4 Minute Liters of Oxygen  0 L     5 Minute Oxygen Saturation %  95 %     5 Minute Liters of Oxygen  0 L     6 Minute Oxygen Saturation %  95 %     6 Minute Liters of Oxygen  0 L     2 Minute Post Oxygen Saturation %  96 %     2 Minute Post Liters of Oxygen  0 L       Oxygen Initial Assessment: Oxygen Initial Assessment - 09/21/18 0810      Home Oxygen   Home Oxygen Device  None    Sleep Oxygen Prescription  None    Home Exercise Oxygen Prescription  None    Home at Rest Exercise Oxygen Prescription  None      Initial 6 min Walk   Oxygen Used  None      Program Oxygen Prescription   Program Oxygen Prescription  None      Intervention   Short Term Goals  To learn and demonstrate proper pursed lip breathing techniques or other breathing techniques.;To learn and understand importance of monitoring SPO2 with pulse oximeter and demonstrate accurate use of the pulse oximeter.;To learn and understand importance of maintaining oxygen saturations>88%;To learn and demonstrate proper use of respiratory medications    Long  Term Goals  Verbalizes importance of monitoring  SPO2 with pulse oximeter and return demonstration;Exhibits proper breathing techniques, such as pursed lip breathing or other method taught during program session;Demonstrates proper use of MDI's;Compliance with respiratory medication;Maintenance of O2 saturations>88%       Oxygen Re-Evaluation:   Oxygen Discharge (Final Oxygen Re-Evaluation):   Initial Exercise Prescription: Initial Exercise Prescription - 09/22/18 1200      Date of Initial Exercise RX and Referring Provider   Date  09/22/18    Referring Provider  Sonnenberg      Treadmill   MPH  3.3    Grade  1    Minutes  15    METs  4      Elliptical   Level  1    Speed  3    Minutes  15      REL-XR   Level  3    Speed  50    Minutes  15    METs  4      T5 Nustep   Level  3    SPM  80    Minutes  15    METs  4      Prescription Details   Frequency (times per week)  3    Duration  Progress to 30 minutes of continuous aerobic without signs/symptoms of physical distress      Intensity   THRR 40-80% of Max Heartrate  107-136    Ratings of Perceived Exertion  11-15    Perceived Dyspnea  0-4      Progression   Progression  Continue to progress workloads to maintain intensity without signs/symptoms of physical distress.      Resistance Training   Training Prescription  Yes    Weight  5 lb    Reps  10-15       Perform Capillary Blood Glucose checks as needed.  Exercise Prescription Changes: Exercise Prescription Changes    Row Name 09/22/18 1200             Response to Exercise   Blood Pressure (Admit)  102/58       Blood Pressure (Exercise)  138/58       Blood Pressure (Exit)  122/58       Heart Rate (Admit)  78 bpm       Heart Rate (Exercise)  121 bpm       Heart Rate (Exit)  97 bpm       Oxygen Saturation (Admit)  94 %       Oxygen Saturation (Exercise)  93 %       Oxygen Saturation (Exit)  96 %       Rating of Perceived Exertion (Exercise)  17       Perceived Dyspnea (Exercise)  3.5        Symptoms  none          Exercise Comments:   Exercise Goals and Review: Exercise Goals    Row Name 09/22/18 1227             Exercise Goals   Increase Physical Activity  Yes       Intervention  Provide advice, education, support and counseling about physical activity/exercise needs.;Develop an individualized exercise prescription for aerobic and resistive training based on initial evaluation findings, risk stratification, comorbidities and participant's personal goals.       Expected Outcomes  Short Term: Attend rehab on a regular basis to increase amount of physical activity.;Long Term: Add in home exercise to make exercise part of routine and to increase amount of physical activity.;Long Term: Exercising regularly at least 3-5 days a week.       Increase Strength and Stamina  Yes       Intervention  Provide advice, education, support and counseling about physical activity/exercise needs.;Develop an individualized exercise prescription for aerobic and resistive training based on initial evaluation findings, risk stratification, comorbidities and participant's personal goals.       Expected Outcomes  Short Term: Increase workloads from initial exercise prescription for resistance, speed, and METs.;Short Term: Perform resistance training exercises routinely during rehab and add in resistance training at home;Long Term: Improve cardiorespiratory fitness, muscular endurance and strength as measured by increased METs and functional capacity (6MWT)  Able to understand and use rate of perceived exertion (RPE) scale  Yes       Intervention  Provide education and explanation on how to use RPE scale       Expected Outcomes  Short Term: Able to use RPE daily in rehab to express subjective intensity level;Long Term:  Able to use RPE to guide intensity level when exercising independently       Able to understand and use Dyspnea scale  Yes       Intervention  Provide education and explanation on  how to use Dyspnea scale       Expected Outcomes  Short Term: Able to use Dyspnea scale daily in rehab to express subjective sense of shortness of breath during exertion;Long Term: Able to use Dyspnea scale to guide intensity level when exercising independently       Knowledge and understanding of Target Heart Rate Range (THRR)  Yes       Intervention  Provide education and explanation of THRR including how the numbers were predicted and where they are located for reference       Expected Outcomes  Short Term: Able to state/look up THRR;Short Term: Able to use daily as guideline for intensity in rehab;Long Term: Able to use THRR to govern intensity when exercising independently       Able to check pulse independently  Yes       Intervention  Provide education and demonstration on how to check pulse in carotid and radial arteries.;Review the importance of being able to check your own pulse for safety during independent exercise       Expected Outcomes  Short Term: Able to explain why pulse checking is important during independent exercise;Long Term: Able to check pulse independently and accurately       Understanding of Exercise Prescription  Yes       Intervention  Provide education, explanation, and written materials on patient's individual exercise prescription       Expected Outcomes  Short Term: Able to explain program exercise prescription;Long Term: Able to explain home exercise prescription to exercise independently          Exercise Goals Re-Evaluation :   Discharge Exercise Prescription (Final Exercise Prescription Changes): Exercise Prescription Changes - 09/22/18 1200      Response to Exercise   Blood Pressure (Admit)  102/58    Blood Pressure (Exercise)  138/58    Blood Pressure (Exit)  122/58    Heart Rate (Admit)  78 bpm    Heart Rate (Exercise)  121 bpm    Heart Rate (Exit)  97 bpm    Oxygen Saturation (Admit)  94 %    Oxygen Saturation (Exercise)  93 %    Oxygen  Saturation (Exit)  96 %    Rating of Perceived Exertion (Exercise)  17    Perceived Dyspnea (Exercise)  3.5    Symptoms  none       Nutrition:  Target Goals: Understanding of nutrition guidelines, daily intake of sodium '1500mg'$ , cholesterol '200mg'$ , calories 30% from fat and 7% or less from saturated fats, daily to have 5 or more servings of fruits and vegetables.  Biometrics: Pre Biometrics - 09/22/18 1228      Pre Biometrics   Height  6' 1.25" (1.861 m)    Weight  216 lb 14.4 oz (98.4 kg)    BMI (Calculated)  28.41        Nutrition Therapy Plan and Nutrition Goals: Nutrition Therapy &  Goals - 09/22/18 1133      Nutrition Therapy   Drug/Food Interactions  Coumadin/Vit K    Protein (specify units)  80g    Fiber  30 grams    Whole Grain Foods  3 servings    Saturated Fats  12 max. grams    Fruits and Vegetables  5 servings/day    Sodium  1.5 grams      Personal Nutrition Goals   Nutrition Goal  ST: include one plant based protein snack; to increase fiber and protein LT: lose ~10 lbs, get stronger for surgery    Comments  Pt reports eating 5 health bars for weight loss (12g pro, 100kcal each) with s lean protein and vegetable for the day - discussed how vit/min from whole foods was optimal and that was insuffient kcals. Gave weight expectations and how his pro/kcal needs are higher due to his COPD. Pt reports lactose intolerance and only has dairy on occasion. Pt reports baking/grilling fish or chicken. Pt reports eating limited "junk foods" and even processed foods. In am will have coffee, sausage, eggs, and sometimes grits. Pt will not usually have lunch "not eating doesn't bother me", but will sometimes have a protein shake which is high in prootein but fairly low in calories (told pt that could make a good snack. Pt reports that he needs medication and the protein shake to stay regular, discussed fiber needs. Discussed HH eating. Pt reports that lasix is prescribed but has not  been used (was prescribed over telehealth just in case).      Intervention Plan   Intervention  Prescribe, educate and counsel regarding individualized specific dietary modifications aiming towards targeted core components such as weight, hypertension, lipid management, diabetes, heart failure and other comorbidities.;Nutrition handout(s) given to patient.    Expected Outcomes  Short Term Goal: Understand basic principles of dietary content, such as calories, fat, sodium, cholesterol and nutrients.;Short Term Goal: A plan has been developed with personal nutrition goals set during dietitian appointment.;Long Term Goal: Adherence to prescribed nutrition plan.       Nutrition Assessments: Nutrition Assessments - 09/22/18 1144      MEDFICTS Scores   Pre Score  26       Nutrition Goals Re-Evaluation:   Nutrition Goals Discharge (Final Nutrition Goals Re-Evaluation):   Psychosocial: Target Goals: Acknowledge presence or absence of significant depression and/or stress, maximize coping skills, provide positive support system. Participant is able to verbalize types and ability to use techniques and skills needed for reducing stress and depression.   Initial Review & Psychosocial Screening: Initial Psych Review & Screening - 09/21/18 0814      Initial Review   Current issues with  History of Depression;Current Sleep Concerns   He sleeps about 5-6 hours a night, uses CBD oil to help, gets up two to three times to use bathroom and is often able to get back to sleep     Family Dynamics   Good Support System?  Yes   Wife [name]), daughter  (Amy), son Corene Cornea), 3 grandson, and good relationships wiht in-law   Comments  Attends a great church but has not been since [place], will wait til after surgery      Barriers   Psychosocial barriers to participate in program  There are no identifiable barriers or psychosocial needs.;The patient should benefit from training in stress management and  relaxation.      Screening Interventions   Interventions  Encouraged to exercise;To provide support and resources  with identified psychosocial needs    Expected Outcomes  Long Term Goal: Stressors or current issues are controlled or eliminated.;Short Term goal: Utilizing psychosocial counselor, staff and physician to assist with identification of specific Stressors or current issues interfering with healing process. Setting desired goal for each stressor or current issue identified.;Short Term goal: Identification and review with participant of any Quality of Life or Depression concerns found by scoring the questionnaire.;Long Term goal: The participant improves quality of Life and PHQ9 Scores as seen by post scores and/or verbalization of changes       Quality of Life Scores:  Scores of 19 and below usually indicate a poorer quality of life in these areas.  A difference of  2-3 points is a clinically meaningful difference.  A difference of 2-3 points in the total score of the Quality of Life Index has been associated with significant improvement in overall quality of life, self-image, physical symptoms, and general health in studies assessing change in quality of life.  PHQ-9: Recent Review Flowsheet Data    Depression screen [facility] 2/9 09/22/2018 09/10/2018 07/14/2018 01/04/2018 12/22/2016   Decreased Interest 1 0 0 0 0   Down, Depressed, Hopeless 1 0 0 0 0   PHQ - 2 Score 2 0 0 0 0   Altered sleeping 2 - 0 - 0   Tired, decreased energy 2 - 0 - 0   Change in appetite 0 - 0 - -   Feeling bad or failure about yourself  0 - 0 - 0   Trouble concentrating 1 - 0 - 0   Moving slowly or fidgety/restless 0 - 0 - 0   Suicidal thoughts 0 - 0 - 0   PHQ-9 Score 7 - 0 - 0   Difficult doing work/chores Not difficult at all - Not difficult at all - -     Interpretation of Total Score  Total Score Depression Severity:  1-4 = Minimal depression, 5-9 = Mild depression, 10-14 = Moderate depression, 15-19 =  Moderately severe depression, 20-27 = Severe depression   Psychosocial Evaluation and Intervention:   Psychosocial Re-Evaluation:   Psychosocial Discharge (Final Psychosocial Re-Evaluation):   Education: Education Goals: Education classes will be provided on a weekly basis, covering required topics. Participant will state understanding/return demonstration of topics presented.  Learning Barriers/Preferences: Learning Barriers/Preferences - 09/21/18 0813      Learning Barriers/Preferences   Learning Barriers  Sight   glasses   Learning Preferences  None       Education Topics:  Initial Evaluation Education: - Verbal, written and demonstration of respiratory meds, oximetry and breathing techniques. Instruction on use of nebulizers and MDIs and importance of monitoring MDI activations.   Pulmonary Rehab from 11/24/2016 in [facility] Cardiac and Pulmonary Rehab  Date  08/12/16  Educator  SB  Instruction Review Code (retired)  2- meets goals/outcomes      General Nutrition Guidelines/Fats and Fiber: -Group instruction provided by verbal, written material, models and posters to present the general guidelines for heart healthy nutrition. Gives an explanation and review of dietary fats and fiber.   Pulmonary Rehab from 11/24/2016 in [facility] Cardiac and Pulmonary Rehab  Date  11/17/16  Educator  CR  Instruction Review Code  1- Verbalizes Understanding      Controlling Sodium/Reading Food Labels: -Group verbal and written material supporting the discussion of sodium use in heart healthy nutrition. Review and explanation with models, verbal and written materials for utilization of the food label.   Pulmonary  Rehab from 11/24/2016 in [facility] Cardiac and Pulmonary Rehab  Date  11/24/16  Educator  CR  Instruction Review Code  5- Refused Teaching      Exercise Physiology & General Exercise Guidelines: - Group verbal and written instruction with models to review the exercise physiology of  the cardiovascular system and associated critical values. Provides general exercise guidelines with specific guidelines to those with heart or lung disease.    Pulmonary Rehab from 11/24/2016 in [facility] Cardiac and Pulmonary Rehab  Date  11/07/16  Educator  Nada Maclachlan, EP  Instruction Review Code  2- Demonstrated Understanding      Aerobic Exercise & Resistance Training: - Gives group verbal and written instruction on the various components of exercise. Focuses on aerobic and resistive training programs and the benefits of this training and how to safely progress through these programs.   Pulmonary Rehab from 11/24/2016 in [facility] Cardiac and Pulmonary Rehab  Date  08/29/16  Educator  [facility]  Instruction Review Code  2- Demonstrated Understanding      Flexibility, Balance, Mind/Body Relaxation: Provides group verbal/written instruction on the benefits of flexibility and balance training, including mind/body exercise modes such as yoga, pilates and tai chi.  Demonstration and skill practice provided.   Pulmonary Rehab from 11/24/2016 in [facility] Cardiac and Pulmonary Rehab  Date  09/17/16  Educator  [facility]  Instruction Review Code  2- Demonstrated Understanding      Stress and Anxiety: - Provides group verbal and written instruction about the health risks of elevated stress and causes of high stress.  Discuss the correlation between heart/lung disease and anxiety and treatment options. Review healthy ways to manage with stress and anxiety.   Pulmonary Rehab from 11/24/2016 in [facility] Cardiac and Pulmonary Rehab  Date  10/22/16  Educator  [facility]  Instruction Review Code  1- Verbalizes Understanding      Depression: - Provides group verbal and written instruction on the correlation between heart/lung disease and depressed mood, treatment options, and the stigmas associated with seeking treatment.   Pulmonary Rehab from 11/24/2016 in [facility] Cardiac and Pulmonary Rehab  Date  09/24/16  Educator  [facility]   Instruction Review Code (retired)  2- meets goals/outcomes      Exercise & Equipment Safety: - Individual verbal instruction and demonstration of equipment use and safety with use of the equipment.   Cardiac Rehab from 09/22/2018 in [facility] Cardiac and Pulmonary Rehab  Date  09/22/18  Educator  AS  Instruction Review Code  1- Verbalizes Understanding      Infection Prevention: - Provides verbal and written material to individual with discussion of infection control including proper hand washing and proper equipment cleaning during exercise session.   Cardiac Rehab from 09/22/2018 in [facility] Cardiac and Pulmonary Rehab  Date  09/22/18  Educator  AS  Instruction Review Code  1- Verbalizes Understanding      Falls Prevention: - Provides verbal and written material to individual with discussion of falls prevention and safety.   Cardiac Rehab from 09/22/2018 in [facility] Cardiac and Pulmonary Rehab  Date  09/22/18  Educator  AS  Instruction Review Code  1- Verbalizes Understanding      Diabetes: - Individual verbal and written instruction to review signs/symptoms of diabetes, desired ranges of glucose level fasting, after meals and with exercise. Advice that pre and post exercise glucose checks will be done for 3 sessions at entry of program.   Chronic Lung Diseases: - Group verbal and written instruction to review updates,  respiratory medications, advancements in procedures and treatments. Discuss use of supplemental oxygen including available portable oxygen systems, continuous and intermittent flow rates, concentrators, personal use and safety guidelines. Review proper use of inhaler and spacers. Provide informative websites for self-education.    Pulmonary Rehab from 11/24/2016 in [facility] Cardiac and Pulmonary Rehab  Date  11/05/16  Educator  [facility]  Instruction Review Code  1- Verbalizes Understanding      Energy Conservation: - Provide group verbal and written instruction for methods to  conserve energy, plan and organize activities. Instruct on pacing techniques, use of adaptive equipment and posture/positioning to relieve shortness of breath.   Pulmonary Rehab from 11/24/2016 in [facility] Cardiac and Pulmonary Rehab  Date  10/15/16  Educator  [facility]  Instruction Review Code  1- Verbalizes Understanding      Triggers and Exacerbations: - Group verbal and written instruction to review types of environmental triggers and ways to prevent exacerbations. Discuss weather changes, air quality and the benefits of nasal washing. Review warning signs and symptoms to help prevent infections. Discuss techniques for effective airway clearance, coughing, and vibrations.   AED/CPR: - Group verbal and written instruction with the use of models to demonstrate the basic use of the AED with the basic ABC's of resuscitation.   Anatomy and Physiology of the Lungs: - Group verbal and written instruction with the use of models to provide basic lung anatomy and physiology related to function, structure and complications of lung disease.   Pulmonary Rehab from 11/24/2016 in [facility] Cardiac and Pulmonary Rehab  Date  10/08/16  Educator  [facility]  Instruction Review Code  1- Verbalizes Understanding      Anatomy & Physiology of the Heart: - Group verbal and written instruction and models provide basic cardiac anatomy and physiology, with the coronary electrical and arterial systems. Review of Valvular disease and Heart Failure   Cardiac Medications: - Group verbal and written instruction to review commonly prescribed medications for heart disease. Reviews the medication, class of the drug, and side effects.   Pulmonary Rehab from 11/24/2016 in [facility] Cardiac and Pulmonary Rehab  Date  10/10/16  Educator  C. EnterkinRN  Instruction Review Code  1- Verbalizes Understanding      Know Your Numbers and Risk Factors: -Group verbal and written instruction about important numbers in your health.  Discussion of  what are risk factors and how they play a role in the disease process.  Review of Cholesterol, Blood Pressure, Diabetes, and BMI and the role they play in your overall health.   Sleep Hygiene: -Provides group verbal and written instruction about how sleep can affect your health.  Define sleep hygiene, discuss sleep cycles and impact of sleep habits. Review good sleep hygiene tips.    Other: -Provides group and verbal instruction on various topics (see comments)    Knowledge Questionnaire Score: Knowledge Questionnaire Score - 09/22/18 1235      Knowledge Questionnaire Score   Pre Score  15/18        Core Components/Risk Factors/Patient Goals at Admission: Personal Goals and Risk Factors at Admission - 09/22/18 1235      Core Components/Risk Factors/Patient Goals on Admission    Weight Management  Yes;Weight Loss       Core Components/Risk Factors/Patient Goals Review:    Core Components/Risk Factors/Patient Goals at Discharge (Final Review):    ITP Comments: ITP Comments    Row Name 09/21/18 0824 09/21/18 0832 09/22/18 1218       ITP Comments  Currently on study with [facility] clinic looking at sodium versus statins.  Not sure what he is taking, but numbers seem to be good.  Virtual orientation visit completed.  Documentation for diagnosis can be found in [facility] encounter 8/7.  Completed 6MWT and nutrition eval Initial ITP created and sent to Dr Sabra Heck.        Comments: Initial ITP

## 2018-09-24 ENCOUNTER — Encounter: Payer: Medicare Other | Admitting: *Deleted

## 2018-09-24 ENCOUNTER — Other Ambulatory Visit: Payer: Self-pay

## 2018-09-24 DIAGNOSIS — J449 Chronic obstructive pulmonary disease, unspecified: Secondary | ICD-10-CM

## 2018-09-24 DIAGNOSIS — R0600 Dyspnea, unspecified: Secondary | ICD-10-CM

## 2018-09-24 NOTE — Progress Notes (Signed)
Daily Session Note  Patient Details  Name: Alexander Barton MRN: [id number] Date of Birth: 07-10-1950 Referring Provider:     Cardiac Rehab from 09/22/2018 in [facility] Cardiac and Pulmonary Rehab  Referring Provider  [name]      Encounter Date: 09/24/2018  Check In: Session Check In - 09/24/18 0924      Check-In   Supervising physician immediately available to respond to emergencies  See telemetry face sheet for immediately available ER MD    Location  [facility]-Cardiac & Pulmonary Rehab    Staff Present  Heath Lark, RN, BSN, CCRP;Kaydence Menard [place], MA, RCEP, CCRP, [place], [place], ACSM CEP, Exercise Physiologist    Virtual Visit  No    Medication changes reported      No    Fall or balance concerns reported     No    Warm-up and Cool-down  Performed on first and last piece of equipment    Resistance Training Performed  Yes    VAD Patient?  No    PAD/SET Patient?  No      Pain Assessment   Currently in Pain?  No/denies          Social History   Tobacco Use  Smoking Status Never Smoker  Smokeless Tobacco Never Used    Goals Met:  Exercise tolerated well Personal goals reviewed No report of cardiac concerns or symptoms Strength training completed today  Goals Unmet:  Not Applicable  Comments: First full day of exercise!  Patient was oriented to gym and equipment including functions, settings, policies, and procedures.  Patient's individual exercise prescription and treatment plan were reviewed.  All starting workloads were established based on the results of the 6 minute walk test done at initial orientation visit.  The plan for exercise progression was also introduced and progression will be customized based on patient's performance and goals.    Dr. Emily Filbert is Medical Director for [place] and [facility].

## 2018-09-27 ENCOUNTER — Other Ambulatory Visit: Payer: Self-pay

## 2018-09-27 ENCOUNTER — Encounter: Payer: Medicare Other | Admitting: *Deleted

## 2018-09-27 DIAGNOSIS — J449 Chronic obstructive pulmonary disease, unspecified: Secondary | ICD-10-CM

## 2018-09-27 DIAGNOSIS — R0600 Dyspnea, unspecified: Secondary | ICD-10-CM

## 2018-09-27 NOTE — Progress Notes (Signed)
Daily Session Note  Patient Details  Name: Alexander Barton MRN: [id number] Date of Birth: [birth year] Referring Provider:     Cardiac Rehab from 09/22/2018 in [facility] Cardiac and Pulmonary Rehab  Referring Provider  [name]      Encounter Date: 09/27/2018  Check In: Session Check In - 09/27/18 0912      Check-In   Supervising physician immediately available to respond to emergencies  See telemetry face sheet for immediately available ER MD    Location  [facility]-Cardiac & Pulmonary Rehab    Staff Present  Heath Lark, RN, BSN, [place]aveda Norman, BS, ACSM CEP, Exercise Physiologist;Jeanna Durrell BS, Exercise Physiologist    Virtual Visit  No    Medication changes reported      No    Fall or balance concerns reported     No    Warm-up and Cool-down  Performed on first and last piece of equipment    Resistance Training Performed  Yes    VAD Patient?  No    PAD/SET Patient?  No      Pain Assessment   Currently in Pain?  No/denies          Social History   Tobacco Use  Smoking Status Never Smoker  Smokeless Tobacco Never Used    Goals Met:  Exercise tolerated well No report of cardiac concerns or symptoms Strength training completed today  Goals Unmet:  Not Applicable  Comments: Pt able to follow exercise prescription today without complaint.  Will continue to monitor for progression.  Reviewed home exercise with pt today.  Pt plans to walk and use elliptical at home for exercise.  Also talked about using YouTube site for other resources.  Reviewed THR, pulse, RPE, sign and symptoms, NTG use, and when to call 911 or MD.  Also discussed weather considerations and indoor options.  Pt voiced understanding.   Dr. Emily Filbert is Medical Director for [place] and [facility].

## 2018-09-29 ENCOUNTER — Encounter: Payer: Medicare Other | Admitting: *Deleted

## 2018-09-29 ENCOUNTER — Other Ambulatory Visit: Payer: Self-pay

## 2018-09-29 DIAGNOSIS — R0600 Dyspnea, unspecified: Secondary | ICD-10-CM

## 2018-09-29 DIAGNOSIS — J449 Chronic obstructive pulmonary disease, unspecified: Secondary | ICD-10-CM | POA: Diagnosis not present

## 2018-09-29 NOTE — Progress Notes (Signed)
Daily Session Note  Patient Details  Name: Alexander Barton MRN: [id number] Date of Birth: 1950-08-06 Referring Provider:     Cardiac Rehab from 09/22/2018 in [facility] Cardiac and Pulmonary Rehab  Referring Provider  [name]      Encounter Date: 09/29/2018  Check In: Session Check In - 09/29/18 4834      Check-In   Supervising physician immediately available to respond to emergencies  See telemetry face sheet for immediately available ER MD    Location  [facility]-Cardiac & Pulmonary Rehab    Staff Present  Jasper Loser BS, Exercise Physiologist;Jessica Luan Pulling, MA, RCEP, CCRP, CCET;Roniel Halloran, RN, BSN, CCRP    Virtual Visit  No    Medication changes reported      No    Fall or balance concerns reported     No    Warm-up and Cool-down  Performed on first and last piece of equipment    Resistance Training Performed  Yes    VAD Patient?  No    PAD/SET Patient?  No      Pain Assessment   Currently in Pain?  No/denies          Social History   Tobacco Use  Smoking Status Never Smoker  Smokeless Tobacco Never Used    Goals Met:  Independence with exercise equipment Exercise tolerated well No report of cardiac concerns or symptoms Strength training completed today  Goals Unmet:  Not Applicable  Comments: Pt able to follow exercise prescription today without complaint.  Will continue to monitor for progression.    Dr. Emily Filbert is Medical Director for [place] and [facility].

## 2018-09-30 ENCOUNTER — Ambulatory Visit (INDEPENDENT_AMBULATORY_CARE_PROVIDER_SITE_OTHER): Payer: Medicare Other | Admitting: Pharmacist

## 2018-09-30 DIAGNOSIS — Z952 Presence of prosthetic heart valve: Secondary | ICD-10-CM | POA: Diagnosis not present

## 2018-09-30 DIAGNOSIS — G459 Transient cerebral ischemic attack, unspecified: Secondary | ICD-10-CM

## 2018-09-30 DIAGNOSIS — Z7901 Long term (current) use of anticoagulants: Secondary | ICD-10-CM | POA: Diagnosis not present

## 2018-09-30 DIAGNOSIS — I359 Nonrheumatic aortic valve disorder, unspecified: Secondary | ICD-10-CM

## 2018-09-30 LAB — POCT INR: INR: 2.3 (ref 2.0–3.0)

## 2018-10-01 ENCOUNTER — Other Ambulatory Visit: Payer: Self-pay

## 2018-10-01 DIAGNOSIS — J449 Chronic obstructive pulmonary disease, unspecified: Secondary | ICD-10-CM

## 2018-10-01 DIAGNOSIS — R0600 Dyspnea, unspecified: Secondary | ICD-10-CM

## 2018-10-01 NOTE — Progress Notes (Signed)
Daily Session Note  Patient Details  Name: Alexander Barton MRN: [id number] Date of Birth: 06/23/1950 Referring Provider:     Cardiac Rehab from 09/22/2018 in [facility] Cardiac and Pulmonary Rehab  Referring Provider  [name]      Encounter Date: 10/01/2018  Check In: Session Check In - 10/01/18 [place]      Check-In   Supervising physician immediately available to respond to emergencies  See telemetry face sheet for immediately available ER MD    Location  [facility]-Cardiac & Pulmonary Rehab    Staff Present  Vida Rigger RN, Vickki Hearing, BA, ACSM CEP, Exercise Physiologist;Jessica Luan Pulling, MA, RCEP, CCRP, CCET    Virtual Visit  No    Medication changes reported      No    Fall or balance concerns reported     No    Warm-up and Cool-down  Performed on first and last piece of equipment    Resistance Training Performed  Yes    VAD Patient?  No    PAD/SET Patient?  No      Pain Assessment   Currently in Pain?  No/denies    Multiple Pain Sites  No          Social History   Tobacco Use  Smoking Status Never Smoker  Smokeless Tobacco Never Used    Goals Met:  Proper associated with RPD/PD & O2 Sat Independence with exercise equipment Exercise tolerated well No report of cardiac concerns or symptoms Strength training completed today  Goals Unmet:  Not Applicable  Comments: Pt able to follow exercise prescription today without complaint.  Will continue to monitor for progression.   Dr. Emily Filbert is Medical Director for [place] and [facility].

## 2018-10-04 ENCOUNTER — Encounter: Payer: Medicare Other | Admitting: *Deleted

## 2018-10-04 ENCOUNTER — Other Ambulatory Visit: Payer: Self-pay

## 2018-10-04 DIAGNOSIS — J449 Chronic obstructive pulmonary disease, unspecified: Secondary | ICD-10-CM | POA: Diagnosis not present

## 2018-10-04 DIAGNOSIS — R0600 Dyspnea, unspecified: Secondary | ICD-10-CM

## 2018-10-04 NOTE — Progress Notes (Signed)
Daily Session Note  Patient Details  Name: Alexander Barton MRN: [id number] Date of Birth: 1951-01-10 Referring Provider:     Cardiac Rehab from 09/22/2018 in [facility] Cardiac and Pulmonary Rehab  Referring Provider  [name]      Encounter Date: 10/04/2018  Check In: Session Check In - 10/04/18 0940      Check-In   Supervising physician immediately available to respond to emergencies  See telemetry face sheet for immediately available ER MD    Location  [facility]-Cardiac & Pulmonary Rehab    Staff Present  Jasper Loser BS, Exercise Physiologist;Kelly Amedeo Plenty, BS, ACSM CEP, Exercise Physiologist;Hailey Stormer, RN, BSN, CCRP    Virtual Visit  No    Medication changes reported      No    Fall or balance concerns reported     No    Warm-up and Cool-down  Performed on first and last piece of equipment    Resistance Training Performed  Yes    VAD Patient?  No    PAD/SET Patient?  No      Pain Assessment   Currently in Pain?  No/denies          Social History   Tobacco Use  Smoking Status Never Smoker  Smokeless Tobacco Never Used    Goals Met:  Independence with exercise equipment Exercise tolerated well No report of cardiac concerns or symptoms Strength training completed today  Goals Unmet:  Not Applicable  Comments: Pt able to follow exercise prescription today without complaint.  Will continue to monitor for progression.    Dr. Emily Filbert is Medical Director for [place] and [facility].

## 2018-10-06 ENCOUNTER — Encounter: Payer: Medicare Other | Attending: Family Medicine | Admitting: *Deleted

## 2018-10-06 ENCOUNTER — Other Ambulatory Visit: Payer: Self-pay

## 2018-10-06 DIAGNOSIS — Z79899 Other long term (current) drug therapy: Secondary | ICD-10-CM | POA: Insufficient documentation

## 2018-10-06 DIAGNOSIS — Z7901 Long term (current) use of anticoagulants: Secondary | ICD-10-CM | POA: Diagnosis not present

## 2018-10-06 DIAGNOSIS — Z7982 Long term (current) use of aspirin: Secondary | ICD-10-CM | POA: Insufficient documentation

## 2018-10-06 DIAGNOSIS — J449 Chronic obstructive pulmonary disease, unspecified: Secondary | ICD-10-CM | POA: Insufficient documentation

## 2018-10-06 DIAGNOSIS — K219 Gastro-esophageal reflux disease without esophagitis: Secondary | ICD-10-CM | POA: Insufficient documentation

## 2018-10-06 DIAGNOSIS — I10 Essential (primary) hypertension: Secondary | ICD-10-CM | POA: Diagnosis not present

## 2018-10-06 DIAGNOSIS — E7800 Pure hypercholesterolemia, unspecified: Secondary | ICD-10-CM | POA: Insufficient documentation

## 2018-10-06 DIAGNOSIS — Z8673 Personal history of transient ischemic attack (TIA), and cerebral infarction without residual deficits: Secondary | ICD-10-CM | POA: Diagnosis not present

## 2018-10-06 DIAGNOSIS — Z7951 Long term (current) use of inhaled steroids: Secondary | ICD-10-CM | POA: Insufficient documentation

## 2018-10-06 NOTE — Progress Notes (Signed)
Daily Session Note  Patient Details  Name: RAWLIN REAUME MRN: [id number] Date of Birth: 16-Aug-1950 Referring Provider:     Cardiac Rehab from 09/22/2018 in [facility] Cardiac and Pulmonary Rehab  Referring Provider  [name]      Encounter Date: 10/06/2018  Check In: Session Check In - 10/06/18 0947      Check-In   Supervising physician immediately available to respond to emergencies  See telemetry face sheet for immediately available ER MD    Location  [facility]-Cardiac & Pulmonary Rehab    Staff Present  Alberteen Sam, MA, RCEP, CCRP, CCET;Joseph Christain [place], [place] BSN    Virtual Visit  No    Medication changes reported      No    Fall or balance concerns reported     No    Warm-up and Cool-down  Performed on first and last piece of equipment    Resistance Training Performed  Yes    VAD Patient?  No    PAD/SET Patient?  No      Pain Assessment   Currently in Pain?  No/denies          Social History   Tobacco Use  Smoking Status Never Smoker  Smokeless Tobacco Never Used    Goals Met:  Proper associated with RPD/PD & O2 Sat Independence with exercise equipment Exercise tolerated well No report of cardiac concerns or symptoms Strength training completed today  Goals Unmet:  Not Applicable  Comments: Pt able to follow exercise prescription today without complaint.  Will continue to monitor for progression.    Dr. Emily Filbert is Medical Director for [place] and [facility].

## 2018-10-08 ENCOUNTER — Other Ambulatory Visit: Payer: Self-pay

## 2018-10-08 DIAGNOSIS — J449 Chronic obstructive pulmonary disease, unspecified: Secondary | ICD-10-CM | POA: Diagnosis not present

## 2018-10-08 DIAGNOSIS — R0600 Dyspnea, unspecified: Secondary | ICD-10-CM

## 2018-10-08 NOTE — Progress Notes (Signed)
Daily Session Note  Patient Details  Name: Alexander Barton MRN: [id number] Date of Birth: 05-03-1950 Referring Provider:     Cardiac Rehab from 09/22/2018 in [facility] Cardiac and Pulmonary Rehab  Referring Provider  Alexander Barton      Encounter Date: 10/08/2018  Check In: Session Check In - 10/08/18 0918      Check-In   Supervising physician immediately available to respond to emergencies  See telemetry face sheet for immediately available ER MD    Location  [facility]-Cardiac & Pulmonary Rehab    Staff Present  Vida Rigger RN, BSN;Jessica Luan Pulling, MA, RCEP, CCRP, CCET;Joseph Hood RCP,RRT,BSRT    Virtual Visit  No    Medication changes reported      No    Fall or balance concerns reported     No    Warm-up and Cool-down  Performed on first and last piece of equipment    Resistance Training Performed  Yes    VAD Patient?  No    PAD/SET Patient?  No      Pain Assessment   Currently in Pain?  No/denies    Multiple Pain Sites  No          Social History   Tobacco Use  Smoking Status Never Smoker  Smokeless Tobacco Never Used    Goals Met:  Proper associated with RPD/PD & O2 Sat Independence with exercise equipment Exercise tolerated well No report of cardiac concerns or symptoms Strength training completed today  Goals Unmet:  Not Applicable  Comments: Pt able to follow exercise prescription today without complaint.  Will continue to monitor for progression.    Dr. Emily Filbert is Medical Director for [name] and [facility].

## 2018-10-13 ENCOUNTER — Encounter: Payer: Medicare Other | Admitting: *Deleted

## 2018-10-13 ENCOUNTER — Other Ambulatory Visit: Payer: Self-pay

## 2018-10-13 ENCOUNTER — Encounter: Payer: Self-pay | Admitting: *Deleted

## 2018-10-13 DIAGNOSIS — J449 Chronic obstructive pulmonary disease, unspecified: Secondary | ICD-10-CM

## 2018-10-13 DIAGNOSIS — R0600 Dyspnea, unspecified: Secondary | ICD-10-CM

## 2018-10-13 NOTE — Progress Notes (Signed)
Daily Session Note  Patient Details  Name: Alexander Barton MRN: [id number] Date of Birth: 05/06/1950 Referring Provider:     Cardiac Rehab from 09/22/2018 in [facility] Cardiac and Pulmonary Rehab  Referring Provider  [name]      Encounter Date: 10/13/2018  Check In: Session Check In - 10/13/18 0915      Check-In   Supervising physician immediately available to respond to emergencies  See telemetry face sheet for immediately available ER MD    Location  [facility]-Cardiac & Pulmonary Rehab    Staff Present  Heath Lark, RN, BSN, CCRP;Joseph Hood RCP,RRT,BSRT;Jessica [place], [place], [place], [place], CCET    Virtual Visit  No    Medication changes reported      No    Fall or balance concerns reported     No    Warm-up and Cool-down  Performed on first and last piece of equipment    Resistance Training Performed  Yes    VAD Patient?  No    PAD/SET Patient?  No      Pain Assessment   Currently in Pain?  No/denies          Social History   Tobacco Use  Smoking Status Never Smoker  Smokeless Tobacco Never Used    Goals Met:  Independence with exercise equipment Exercise tolerated well No report of cardiac concerns or symptoms Strength training completed today  Goals Unmet:  Not Applicable  Comments: Pt able to follow exercise prescription today without complaint.  Will continue to monitor for progression.    Dr. Emily Filbert is Medical Director for [place] and [facility].

## 2018-10-13 NOTE — Progress Notes (Signed)
Pulmonary Individual Treatment Plan  Patient Details  Name: Alexander Barton MRN: [id number] Date of Birth: 1950/02/09 Referring Provider:     Cardiac Rehab from 09/22/2018 in [facility] Cardiac and Pulmonary Rehab  Referring Provider  Sonnenberg      Initial Encounter Date:    Cardiac Rehab from 09/22/2018 in [facility] Cardiac and Pulmonary Rehab  Date  09/22/18      Visit Diagnosis: Chronic obstructive pulmonary disease, unspecified COPD type ([place])  Dyspnea, unspecified type  Patient's Home Medications on Admission:  Current Outpatient Medications:  .  albuterol (VENTOLIN HFA) 108 (90 Base) MCG/ACT inhaler, Inhale 2 puffs into the lungs every 4 (four) hours as needed for wheezing or shortness of breath., Disp: 18 g, Rfl: 1 .  amLODipine (NORVASC) 5 MG tablet, TAKE 1 TABLET BY MOUTH EVERY DAY, Disp: 90 tablet, Rfl: 1 .  aspirin 81 MG tablet, Take 81 mg by mouth at bedtime. , Disp: , Rfl:  .  benzonatate (TESSALON) 100 MG capsule, Take 1 or 2 capsules every 8 hours as needed for cough, Disp: 30 capsule, Rfl: 1 .  budesonide-formoterol (SYMBICORT) 160-4.5 MCG/ACT inhaler, TAKE 2 PUFFS BY MOUTH TWICE A DAY, Disp: 30.6 Inhaler, Rfl: 4 .  doxycycline (VIBRA-TABS) 100 MG tablet, Take 1 tablet (100 mg total) by mouth 2 (two) times daily., Disp: 20 tablet, Rfl: 0 .  enoxaparin (LOVENOX) 100 MG/ML injection, Inject 1 mL (100 mg total) into the skin every 12 (twelve) hours. As instructed by coumadin clinic, Disp: 20 mL, Rfl: 0 .  furosemide (LASIX) 20 MG tablet, TAKE 1/2 TABLET (10 MG) 1 OR 2 TIMES A DAY AS NEEDED FOR LEG SWELLING, Disp: 90 tablet, Rfl: 1 .  metoprolol succinate (TOPROL-XL) 50 MG 24 hr tablet, Take 1 tablet (50 mg total) by mouth daily. Take with or immediately following a meal., Disp: 90 tablet, Rfl: 1 .  NON FORMULARY, , Disp: , Rfl:  .  Polyethylene Glycol 3350 (MIRALAX PO), Take by mouth., Disp: , Rfl:  .  senna-docusate (SENOKOT-S) 8.6-50 MG tablet, Take by mouth., Disp: , Rfl:   .  tamsulosin (FLOMAX) 0.4 MG CAPS capsule, TAKE 2 CAPSULES BY MOUTH EVERY DAY, Disp: 174 capsule, Rfl: 1 .  Tiotropium Bromide Monohydrate (SPIRIVA RESPIMAT) 2.5 MCG/ACT AERS, INHALE 2.5 MCG INTO THE LUNGS 2 (TWO) TIMES DAILY., Disp: 1 Inhaler, Rfl: 5 .  warfarin (COUMADIN) 2 MG tablet, TAKE 1 TABLET WITH 5 MG TABLET DAILY AS DIRECTED BY COUMADIN CLINIC, Disp: 90 tablet, Rfl: 0 .  warfarin (COUMADIN) 5 MG tablet, TAKE 1 TABLET DAILY AS DIRECTED WITH 2 MG TABLET, Disp: 90 tablet, Rfl: 1  Past Medical History: Past Medical History:  Diagnosis Date  . Aortic aneurysm, thoracic ([place])   . Aortic valve disease   . Arthritis   . BPH (benign prostatic hyperplasia)   . COPD (chronic obstructive pulmonary disease) ([place])   . GERD (gastroesophageal reflux disease)   . HTN (hypertension)   . Hypercholesterolemia   . Stroke ([place])   . TIA (transient ischemic attack)     Tobacco Use: Social History   Tobacco Use  Smoking Status Never Smoker  Smokeless Tobacco Never Used    Labs: Recent Review Flowsheet Data    Labs for ITP Cardiac and Pulmonary Rehab Latest Ref Rng & Units 06/04/2012 11/14/2014 10/03/2015 12/10/2016 12/22/2016   Cholestrol 0 - 200 mg/dL 247(H) 254(H) 174 253(H) -   LDLCALC 0 - 99 mg/dL 169(H) - 87 185(H) -   LDLDIRECT mg/dL -  172.0 - - -   HDL >39.00 mg/dL 45 48.50 47.20 45.00 -   Trlycerides 0.0 - 149.0 mg/dL 165(H) 202.0(H) 195.0(H) 112.0 -   Hemoglobin A1c 4.6 - 6.5 % 5.9(H) - 6.2 - 6.1       Pulmonary Assessment Scores: Pulmonary Assessment Scores    Row Name 09/22/18 1231         ADL UCSD   SOB Score total  62     Rest  1     Walk  2     Stairs  4     Bath  4     Dress  2     Shop  2       CAT Score   CAT Score  14       mMRC Score   mMRC Score  1        UCSD: Self-administered rating of dyspnea associated with activities of daily living (ADLs) 6-point scale (0 = "not at all" to 5 = "maximal or unable to do because of breathlessness")  Scoring  Scores range from 0 to 120.  Minimally important difference is 5 units  CAT: CAT can identify the health impairment of COPD patients and is better correlated with disease progression.  CAT has a scoring range of zero to 40. The CAT score is classified into four groups of low (less than 10), medium (10 - 20), high (21-30) and very high (31-40) based on the impact level of disease on health status. A CAT score over 10 suggests significant symptoms.  A worsening CAT score could be explained by an exacerbation, poor medication adherence, poor inhaler technique, or progression of COPD or comorbid conditions.  CAT MCID is 2 points  mMRC: mMRC (Modified Medical Research Council) Dyspnea Scale is used to assess the degree of baseline functional disability in patients of respiratory disease due to dyspnea. No minimal important difference is established. A decrease in score of 1 point or greater is considered a positive change.   Pulmonary Function Assessment:   Exercise Target Goals: Exercise Program Goal: Individual exercise prescription set using results from initial 6 min walk test and THRR while considering  patient's activity barriers and safety.   Exercise Prescription Goal: Initial exercise prescription builds to 30-45 minutes a day of aerobic activity, 2-3 days per week.  Home exercise guidelines will be given to patient during program as part of exercise prescription that the participant will acknowledge.  Activity Barriers & Risk Stratification: Activity Barriers & Cardiac Risk Stratification - 09/21/18 0810      Activity Barriers & Cardiac Risk Stratification   Activity Barriers  Deconditioning;Muscular Weakness;Joint Problems;Other (comment)    Comments  Hx CVA,  r knee pain       6 Minute Walk: 6 Minute Walk    Row Name 09/22/18 1220         6 Minute Walk   Phase  Initial     Distance  1765 feet     Walk Time  6 minutes     # of Rest Breaks  0     MPH  3.34     METS   4.06     RPE  17     Perceived Dyspnea   3.5     VO2 Peak  14.21     Symptoms  No     Resting HR  78 bpm     Resting BP  102/58     Resting Oxygen Saturation  94 %     Exercise Oxygen Saturation  during 6 min walk  93 %     Max Ex. HR  121 bpm     Max Ex. BP  138/58     2 Minute Post BP  122/58       Interval HR   1 Minute HR  105     2 Minute HR  113     3 Minute HR  116     4 Minute HR  112     5 Minute HR  110     6 Minute HR  121     2 Minute Post HR  97     Interval Heart Rate?  Yes       Interval Oxygen   Interval Oxygen?  Yes     Baseline Oxygen Saturation %  94 %     1 Minute Oxygen Saturation %  93 %     1 Minute Liters of Oxygen  0 L     2 Minute Oxygen Saturation %  94 %     2 Minute Liters of Oxygen  0 L     3 Minute Oxygen Saturation %  94 %     3 Minute Liters of Oxygen  0 L     4 Minute Oxygen Saturation %  94 %     4 Minute Liters of Oxygen  0 L     5 Minute Oxygen Saturation %  95 %     5 Minute Liters of Oxygen  0 L     6 Minute Oxygen Saturation %  95 %     6 Minute Liters of Oxygen  0 L     2 Minute Post Oxygen Saturation %  96 %     2 Minute Post Liters of Oxygen  0 L       Oxygen Initial Assessment: Oxygen Initial Assessment - 09/21/18 0810      Home Oxygen   Home Oxygen Device  None    Sleep Oxygen Prescription  None    Home Exercise Oxygen Prescription  None    Home at Rest Exercise Oxygen Prescription  None      Initial 6 min Walk   Oxygen Used  None      Program Oxygen Prescription   Program Oxygen Prescription  None      Intervention   Short Term Goals  To learn and demonstrate proper pursed lip breathing techniques or other breathing techniques.;To learn and understand importance of monitoring SPO2 with pulse oximeter and demonstrate accurate use of the pulse oximeter.;To learn and understand importance of maintaining oxygen saturations>88%;To learn and demonstrate proper use of respiratory medications    Long  Term Goals   Verbalizes importance of monitoring SPO2 with pulse oximeter and return demonstration;Exhibits proper breathing techniques, such as pursed lip breathing or other method taught during program session;Demonstrates proper use of MDI's;Compliance with respiratory medication;Maintenance of O2 saturations>88%       Oxygen Re-Evaluation: Oxygen Re-Evaluation    Row Name 09/24/18 0932             Goals/Expected Outcomes   Short Term Goals  To learn and understand importance of monitoring SPO2 with pulse oximeter and demonstrate accurate use of the pulse oximeter.;To learn and understand importance of maintaining oxygen saturations>88%;To learn and demonstrate proper pursed lip breathing techniques or other breathing techniques.       Long  Term Goals  Verbalizes importance  of monitoring SPO2 with pulse oximeter and return demonstration;Maintenance of O2 saturations>88%;Exhibits proper breathing techniques, such as pursed lip breathing or other method taught during program session       Comments  Reviewed PLB technique with pt.  Talked about how it work and it's important to maintaining his exercise saturations.       Goals/Expected Outcomes  Short: Become more profiecient at using PLB.   Long: Become independent at using PLB.          Oxygen Discharge (Final Oxygen Re-Evaluation): Oxygen Re-Evaluation - 09/24/18 0932      Goals/Expected Outcomes   Short Term Goals  To learn and understand importance of monitoring SPO2 with pulse oximeter and demonstrate accurate use of the pulse oximeter.;To learn and understand importance of maintaining oxygen saturations>88%;To learn and demonstrate proper pursed lip breathing techniques or other breathing techniques.    Long  Term Goals  Verbalizes importance of monitoring SPO2 with pulse oximeter and return demonstration;Maintenance of O2 saturations>88%;Exhibits proper breathing techniques, such as pursed lip breathing or other method taught during program  session    Comments  Reviewed PLB technique with pt.  Talked about how it work and it's important to maintaining his exercise saturations.    Goals/Expected Outcomes  Short: Become more profiecient at using PLB.   Long: Become independent at using PLB.       Initial Exercise Prescription: Initial Exercise Prescription - 09/22/18 1200      Date of Initial Exercise RX and Referring Provider   Date  09/22/18    Referring Provider  Sonnenberg      Treadmill   MPH  3.3    Grade  1    Minutes  15    METs  4      Elliptical   Level  1    Speed  3    Minutes  15      REL-XR   Level  3    Speed  50    Minutes  15    METs  4      T5 Nustep   Level  3    SPM  80    Minutes  15    METs  4      Prescription Details   Frequency (times per week)  3    Duration  Progress to 30 minutes of continuous aerobic without signs/symptoms of physical distress      Intensity   THRR 40-80% of Max Heartrate  107-136    Ratings of Perceived Exertion  11-15    Perceived Dyspnea  0-4      Progression   Progression  Continue to progress workloads to maintain intensity without signs/symptoms of physical distress.      Resistance Training   Training Prescription  Yes    Weight  5 lb    Reps  10-15       Perform Capillary Blood Glucose checks as needed.  Exercise Prescription Changes: Exercise Prescription Changes    Row Name 09/22/18 1200 10/05/18 1400           Response to Exercise   Blood Pressure (Admit)  102/58  110/70      Blood Pressure (Exercise)  138/58  146/80      Blood Pressure (Exit)  122/58  126/64      Heart Rate (Admit)  78 bpm  80 bpm      Heart Rate (Exercise)  121 bpm  97 bpm  Heart Rate (Exit)  97 bpm  84 bpm      Oxygen Saturation (Admit)  94 %  95 %      Oxygen Saturation (Exercise)  93 %  94 %      Oxygen Saturation (Exit)  96 %  96 %      Rating of Perceived Exertion (Exercise)  17  12      Perceived Dyspnea (Exercise)  3.5  3      Symptoms  none   none      Duration  -  Continue with 30 min of aerobic exercise without signs/symptoms of physical distress.      Intensity  -  THRR unchanged        Progression   Progression  -  Continue to progress workloads to maintain intensity without signs/symptoms of physical distress.      Average METs  -  2.91        Resistance Training   Training Prescription  -  Yes      Weight  -  5 lbs      Reps  -  10-15        Interval Training   Interval Training  -  No        Treadmill   MPH  -  2.8      Grade  -  1      Minutes  -  15      METs  -  3.53        Elliptical   Level  -  1      Speed  -  2.5      Minutes  -  15        REL-XR   Level  -  4      Minutes  -  15      METs  -  3.1        T5 Nustep   Level  -  3      Minutes  -  15      METs  -  3.1         Exercise Comments: Exercise Comments    Row Name 09/24/18 0931           Exercise Comments  First full day of exercise!  Patient was oriented to gym and equipment including functions, settings, policies, and procedures.  Patient's individual exercise prescription and treatment plan were reviewed.  All starting workloads were established based on the results of the 6 minute walk test done at initial orientation visit.  The plan for exercise progression was also introduced and progression will be customized based on patient's performance and goals.          Exercise Goals and Review: Exercise Goals    Row Name 09/22/18 1227             Exercise Goals   Increase Physical Activity  Yes       Intervention  Provide advice, education, support and counseling about physical activity/exercise needs.;Develop an individualized exercise prescription for aerobic and resistive training based on initial evaluation findings, risk stratification, comorbidities and participant's personal goals.       Expected Outcomes  Short Term: Attend rehab on a regular basis to increase amount of physical activity.;Long Term: Add in home  exercise to make exercise part of routine and to increase amount of physical activity.;Long Term: Exercising regularly at least 3-5 days a week.  Increase Strength and Stamina  Yes       Intervention  Provide advice, education, support and counseling about physical activity/exercise needs.;Develop an individualized exercise prescription for aerobic and resistive training based on initial evaluation findings, risk stratification, comorbidities and participant's personal goals.       Expected Outcomes  Short Term: Increase workloads from initial exercise prescription for resistance, speed, and METs.;Short Term: Perform resistance training exercises routinely during rehab and add in resistance training at home;Long Term: Improve cardiorespiratory fitness, muscular endurance and strength as measured by increased METs and functional capacity (6MWT)       Able to understand and use rate of perceived exertion (RPE) scale  Yes       Intervention  Provide education and explanation on how to use RPE scale       Expected Outcomes  Short Term: Able to use RPE daily in rehab to express subjective intensity level;Long Term:  Able to use RPE to guide intensity level when exercising independently       Able to understand and use Dyspnea scale  Yes       Intervention  Provide education and explanation on how to use Dyspnea scale       Expected Outcomes  Short Term: Able to use Dyspnea scale daily in rehab to express subjective sense of shortness of breath during exertion;Long Term: Able to use Dyspnea scale to guide intensity level when exercising independently       Knowledge and understanding of Target Heart Rate Range (THRR)  Yes       Intervention  Provide education and explanation of THRR including how the numbers were predicted and where they are located for reference       Expected Outcomes  Short Term: Able to state/look up THRR;Short Term: Able to use daily as guideline for intensity in rehab;Long Term:  Able to use THRR to govern intensity when exercising independently       Able to check pulse independently  Yes       Intervention  Provide education and demonstration on how to check pulse in carotid and radial arteries.;Review the importance of being able to check your own pulse for safety during independent exercise       Expected Outcomes  Short Term: Able to explain why pulse checking is important during independent exercise;Long Term: Able to check pulse independently and accurately       Understanding of Exercise Prescription  Yes       Intervention  Provide education, explanation, and written materials on patient's individual exercise prescription       Expected Outcomes  Short Term: Able to explain program exercise prescription;Long Term: Able to explain home exercise prescription to exercise independently          Exercise Goals Re-Evaluation : Exercise Goals Re-Evaluation    Row Name 09/24/18 0931 09/27/18 0933 10/05/18 1435         Exercise Goal Re-Evaluation   Exercise Goals Review  Increase Physical Activity;Increase Strength and Stamina;Able to understand and use rate of perceived exertion (RPE) scale;Knowledge and understanding of Target Heart Rate Range (THRR);Able to understand and use Dyspnea scale;Understanding of Exercise Prescription  Increase Physical Activity;Increase Strength and Stamina;Able to understand and use rate of perceived exertion (RPE) scale;Knowledge and understanding of Target Heart Rate Range (THRR);Able to understand and use Dyspnea scale;Able to check pulse independently;Understanding of Exercise Prescription  Increase Physical Activity;Increase Strength and Stamina;Understanding of Exercise Prescription     Comments  Reviewed  RPE scale, THR and program prescription with pt today.  Pt voiced understanding and was given a copy of goals to take home.  -  Rush Landmark is off to a good start in rehab.  He is already up to level 4 on the XR.  He is pleased to be back in  the program to build his strength for surgery. We will continue to monitor his progres.     Expected Outcomes  Short: Use RPE daily to regulate intensity. Long: Follow program prescription in THR.  -  Short: Review home exericse guidelines. Long: Continue to follow program prescription.        Discharge Exercise Prescription (Final Exercise Prescription Changes): Exercise Prescription Changes - 10/05/18 1400      Response to Exercise   Blood Pressure (Admit)  110/70    Blood Pressure (Exercise)  146/80    Blood Pressure (Exit)  126/64    Heart Rate (Admit)  80 bpm    Heart Rate (Exercise)  97 bpm    Heart Rate (Exit)  84 bpm    Oxygen Saturation (Admit)  95 %    Oxygen Saturation (Exercise)  94 %    Oxygen Saturation (Exit)  96 %    Rating of Perceived Exertion (Exercise)  12    Perceived Dyspnea (Exercise)  3    Symptoms  none    Duration  Continue with 30 min of aerobic exercise without signs/symptoms of physical distress.    Intensity  THRR unchanged      Progression   Progression  Continue to progress workloads to maintain intensity without signs/symptoms of physical distress.    Average METs  2.91      Resistance Training   Training Prescription  Yes    Weight  5 lbs    Reps  10-15      Interval Training   Interval Training  No      Treadmill   MPH  2.8    Grade  1    Minutes  15    METs  3.53      Elliptical   Level  1    Speed  2.5    Minutes  15      REL-XR   Level  4    Minutes  15    METs  3.1      T5 Nustep   Level  3    Minutes  15    METs  3.1       Nutrition:  Target Goals: Understanding of nutrition guidelines, daily intake of sodium <1563m, cholesterol <2041m calories 30% from fat and 7% or less from saturated fats, daily to have 5 or more servings of fruits and vegetables.  Biometrics: Pre Biometrics - 09/22/18 1228      Pre Biometrics   Height  6' 1.25" (1.861 m)    Weight  216 lb 14.4 oz (98.4 kg)    BMI (Calculated)  28.41         Nutrition Therapy Plan and Nutrition Goals: Nutrition Therapy & Goals - 09/22/18 1133      Nutrition Therapy   Drug/Food Interactions  Coumadin/Vit K    Protein (specify units)  80g    Fiber  30 grams    Whole Grain Foods  3 servings    Saturated Fats  12 max. grams    Fruits and Vegetables  5 servings/day    Sodium  1.5 grams      Personal Nutrition Goals  Nutrition Goal  ST: include one plant based protein snack; to increase fiber and protein LT: lose ~10 lbs, get stronger for surgery    Comments  Pt reports eating 5 health bars for weight loss (12g pro, 100kcal each) with s lean protein and vegetable for the day - discussed how vit/min from whole foods was optimal and that was insuffient kcals. Gave weight expectations and how his pro/kcal needs are higher due to his COPD. Pt reports lactose intolerance and only has dairy on occasion. Pt reports baking/grilling fish or chicken. Pt reports eating limited "junk foods" and even processed foods. In am will have coffee, sausage, eggs, and sometimes grits. Pt will not usually have lunch "not eating doesn't bother me", but will sometimes have a protein shake which is high in prootein but fairly low in calories (told pt that could make a good snack. Pt reports that he needs medication and the protein shake to stay regular, discussed fiber needs. Discussed HH eating. Pt reports that lasix is prescribed but has not been used (was prescribed over telehealth just in case).      Intervention Plan   Intervention  Prescribe, educate and counsel regarding individualized specific dietary modifications aiming towards targeted core components such as weight, hypertension, lipid management, diabetes, heart failure and other comorbidities.;Nutrition handout(s) given to patient.    Expected Outcomes  Short Term Goal: Understand basic principles of dietary content, such as calories, fat, sodium, cholesterol and nutrients.;Short Term Goal: A plan has been  developed with personal nutrition goals set during dietitian appointment.;Long Term Goal: Adherence to prescribed nutrition plan.       Nutrition Assessments: Nutrition Assessments - 09/22/18 1144      MEDFICTS Scores   Pre Score  26       Nutrition Goals Re-Evaluation:   Nutrition Goals Discharge (Final Nutrition Goals Re-Evaluation):   Psychosocial: Target Goals: Acknowledge presence or absence of significant depression and/or stress, maximize coping skills, provide positive support system. Participant is able to verbalize types and ability to use techniques and skills needed for reducing stress and depression.   Initial Review & Psychosocial Screening: Initial Psych Review & Screening - 09/21/18 0814      Initial Review   Current issues with  History of Depression;Current Sleep Concerns   He sleeps about 5-6 hours a night, uses CBD oil to help, gets up two to three times to use bathroom and is often able to get back to sleep     Family Dynamics   Good Support System?  Yes   Wife [name]), daughter  (Amy), son Corene Cornea), 3 grandson, and good relationships wiht in-law   Comments  Attends a great church but has not been since [place], will wait til after surgery      Barriers   Psychosocial barriers to participate in program  There are no identifiable barriers or psychosocial needs.;The patient should benefit from training in stress management and relaxation.      Screening Interventions   Interventions  Encouraged to exercise;To provide support and resources with identified psychosocial needs    Expected Outcomes  Long Term Goal: Stressors or current issues are controlled or eliminated.;Short Term goal: Utilizing psychosocial counselor, staff and physician to assist with identification of specific Stressors or current issues interfering with healing process. Setting desired goal for each stressor or current issue identified.;Short Term goal: Identification and review with  participant of any Quality of Life or Depression concerns found by scoring the questionnaire.;Long Term goal: The participant  improves quality of Life and PHQ9 Scores as seen by post scores and/or verbalization of changes       Quality of Life Scores:  Scores of 19 and below usually indicate a poorer quality of life in these areas.  A difference of  2-3 points is a clinically meaningful difference.  A difference of 2-3 points in the total score of the Quality of Life Index has been associated with significant improvement in overall quality of life, self-image, physical symptoms, and general health in studies assessing change in quality of life.  PHQ-9: Recent Review Flowsheet Data    Depression screen [facility] 2/9 09/22/2018 09/10/2018 07/14/2018 01/04/2018 12/22/2016   Decreased Interest 1 0 0 0 0   Down, Depressed, Hopeless 1 0 0 0 0   PHQ - 2 Score 2 0 0 0 0   Altered sleeping 2 - 0 - 0   Tired, decreased energy 2 - 0 - 0   Change in appetite 0 - 0 - -   Feeling bad or failure about yourself  0 - 0 - 0   Trouble concentrating 1 - 0 - 0   Moving slowly or fidgety/restless 0 - 0 - 0   Suicidal thoughts 0 - 0 - 0   PHQ-9 Score 7 - 0 - 0   Difficult doing work/chores Not difficult at all - Not difficult at all - -     Interpretation of Total Score  Total Score Depression Severity:  1-4 = Minimal depression, 5-9 = Mild depression, 10-14 = Moderate depression, 15-19 = Moderately severe depression, 20-27 = Severe depression   Psychosocial Evaluation and Intervention:   Psychosocial Re-Evaluation:   Psychosocial Discharge (Final Psychosocial Re-Evaluation):   Education: Education Goals: Education classes will be provided on a weekly basis, covering required topics. Participant will state understanding/return demonstration of topics presented.  Learning Barriers/Preferences: Learning Barriers/Preferences - 09/21/18 0813      Learning Barriers/Preferences   Learning Barriers  Sight    glasses   Learning Preferences  None       Education Topics:  Initial Evaluation Education: - Verbal, written and demonstration of respiratory meds, oximetry and breathing techniques. Instruction on use of nebulizers and MDIs and importance of monitoring MDI activations.   Pulmonary Rehab from 11/24/2016 in [facility] Cardiac and Pulmonary Rehab  Date  08/12/16  Educator  SB  Instruction Review Code (retired)  2- meets goals/outcomes      General Nutrition Guidelines/Fats and Fiber: -Group instruction provided by verbal, written material, models and posters to present the general guidelines for heart healthy nutrition. Gives an explanation and review of dietary fats and fiber.   Pulmonary Rehab from 11/24/2016 in [facility] Cardiac and Pulmonary Rehab  Date  11/17/16  Educator  CR  Instruction Review Code  1- Verbalizes Understanding      Controlling Sodium/Reading Food Labels: -Group verbal and written material supporting the discussion of sodium use in heart healthy nutrition. Review and explanation with models, verbal and written materials for utilization of the food label.   Pulmonary Rehab from 11/24/2016 in [facility] Cardiac and Pulmonary Rehab  Date  11/24/16  Educator  CR  Instruction Review Code  5- Refused Teaching      Exercise Physiology & General Exercise Guidelines: - Group verbal and written instruction with models to review the exercise physiology of the cardiovascular system and associated critical values. Provides general exercise guidelines with specific guidelines to those with heart or lung disease.    Pulmonary Rehab from  11/24/2016 in [facility] Cardiac and Pulmonary Rehab  Date  11/07/16  Educator  Nada Maclachlan, EP  Instruction Review Code  2- Demonstrated Understanding      Aerobic Exercise & Resistance Training: - Gives group verbal and written instruction on the various components of exercise. Focuses on aerobic and resistive training programs and the benefits of  this training and how to safely progress through these programs.   Pulmonary Rehab from 11/24/2016 in [facility] Cardiac and Pulmonary Rehab  Date  08/29/16  Educator  [facility]  Instruction Review Code  2- Demonstrated Understanding      Flexibility, Balance, Mind/Body Relaxation: Provides group verbal/written instruction on the benefits of flexibility and balance training, including mind/body exercise modes such as yoga, pilates and tai chi.  Demonstration and skill practice provided.   Pulmonary Rehab from 11/24/2016 in [facility] Cardiac and Pulmonary Rehab  Date  09/17/16  Educator  [facility]  Instruction Review Code  2- Demonstrated Understanding      Stress and Anxiety: - Provides group verbal and written instruction about the health risks of elevated stress and causes of high stress.  Discuss the correlation between heart/lung disease and anxiety and treatment options. Review healthy ways to manage with stress and anxiety.   Pulmonary Rehab from 11/24/2016 in [facility] Cardiac and Pulmonary Rehab  Date  10/22/16  Educator  [facility]  Instruction Review Code  1- Verbalizes Understanding      Depression: - Provides group verbal and written instruction on the correlation between heart/lung disease and depressed mood, treatment options, and the stigmas associated with seeking treatment.   Pulmonary Rehab from 11/24/2016 in [facility] Cardiac and Pulmonary Rehab  Date  09/24/16  Educator  [facility]  Instruction Review Code (retired)  2- meets goals/outcomes      Exercise & Equipment Safety: - Individual verbal instruction and demonstration of equipment use and safety with use of the equipment.   Cardiac Rehab from 09/22/2018 in [facility] Cardiac and Pulmonary Rehab  Date  09/22/18  Educator  AS  Instruction Review Code  1- Verbalizes Understanding      Infection Prevention: - Provides verbal and written material to individual with discussion of infection control including proper hand washing and proper equipment cleaning  during exercise session.   Cardiac Rehab from 09/22/2018 in [facility] Cardiac and Pulmonary Rehab  Date  09/22/18  Educator  AS  Instruction Review Code  1- Verbalizes Understanding      Falls Prevention: - Provides verbal and written material to individual with discussion of falls prevention and safety.   Cardiac Rehab from 09/22/2018 in [facility] Cardiac and Pulmonary Rehab  Date  09/22/18  Educator  AS  Instruction Review Code  1- Verbalizes Understanding      Diabetes: - Individual verbal and written instruction to review signs/symptoms of diabetes, desired ranges of glucose level fasting, after meals and with exercise. Advice that pre and post exercise glucose checks will be done for 3 sessions at entry of program.   Chronic Lung Diseases: - Group verbal and written instruction to review updates, respiratory medications, advancements in procedures and treatments. Discuss use of supplemental oxygen including available portable oxygen systems, continuous and intermittent flow rates, concentrators, personal use and safety guidelines. Review proper use of inhaler and spacers. Provide informative websites for self-education.    Pulmonary Rehab from 11/24/2016 in [facility] Cardiac and Pulmonary Rehab  Date  11/05/16  Educator  [facility]  Instruction Review Code  1- Verbalizes Understanding      Energy Conservation: -  Provide group verbal and written instruction for methods to conserve energy, plan and organize activities. Instruct on pacing techniques, use of adaptive equipment and posture/positioning to relieve shortness of breath.   Pulmonary Rehab from 11/24/2016 in [facility] Cardiac and Pulmonary Rehab  Date  10/15/16  Educator  [facility]  Instruction Review Code  1- Verbalizes Understanding      Triggers and Exacerbations: - Group verbal and written instruction to review types of environmental triggers and ways to prevent exacerbations. Discuss weather changes, air quality and the benefits of nasal washing.  Review warning signs and symptoms to help prevent infections. Discuss techniques for effective airway clearance, coughing, and vibrations.   AED/CPR: - Group verbal and written instruction with the use of models to demonstrate the basic use of the AED with the basic ABC's of resuscitation.   Anatomy and Physiology of the Lungs: - Group verbal and written instruction with the use of models to provide basic lung anatomy and physiology related to function, structure and complications of lung disease.   Pulmonary Rehab from 11/24/2016 in [facility] Cardiac and Pulmonary Rehab  Date  10/08/16  Educator  [facility]  Instruction Review Code  1- Verbalizes Understanding      Anatomy & Physiology of the Heart: - Group verbal and written instruction and models provide basic cardiac anatomy and physiology, with the coronary electrical and arterial systems. Review of Valvular disease and Heart Failure   Cardiac Medications: - Group verbal and written instruction to review commonly prescribed medications for heart disease. Reviews the medication, class of the drug, and side effects.   Pulmonary Rehab from 11/24/2016 in [facility] Cardiac and Pulmonary Rehab  Date  10/10/16  Educator  C. EnterkinRN  Instruction Review Code  1- Verbalizes Understanding      Know Your Numbers and Risk Factors: -Group verbal and written instruction about important numbers in your health.  Discussion of what are risk factors and how they play a role in the disease process.  Review of Cholesterol, Blood Pressure, Diabetes, and BMI and the role they play in your overall health.   Sleep Hygiene: -Provides group verbal and written instruction about how sleep can affect your health.  Define sleep hygiene, discuss sleep cycles and impact of sleep habits. Review good sleep hygiene tips.    Other: -Provides group and verbal instruction on various topics (see comments)    Knowledge Questionnaire Score: Knowledge Questionnaire Score -  09/22/18 1235      Knowledge Questionnaire Score   Pre Score  15/18        Core Components/Risk Factors/Patient Goals at Admission: Personal Goals and Risk Factors at Admission - 09/22/18 1235      Core Components/Risk Factors/Patient Goals on Admission    Weight Management  Weight Loss;Yes    Intervention  Weight Management: Develop a combined nutrition and exercise program designed to reach desired caloric intake, while maintaining appropriate intake of nutrient and fiber, sodium and fats, and appropriate energy expenditure required for the weight goal.;Weight Management: Provide education and appropriate resources to help participant work on and attain dietary goals.    Admit Weight  216 lb 14.4 oz (98.4 kg)    Goal Weight: Short Term  219 lb (99.3 kg)    Goal Weight: Long Term  209 lb (94.8 kg)    Expected Outcomes  Short Term: Continue to assess and modify interventions until short term weight is achieved;Long Term: Adherence to nutrition and physical activity/exercise program aimed toward attainment of established weight goal;Weight  Loss: Understanding of general recommendations for a balanced deficit meal plan, which promotes 1-2 lb weight loss per week and includes a negative energy balance of [id number] kcal/d;Understanding recommendations for meals to include 15-35% energy as protein, 25-35% energy from fat, 35-60% energy from carbohydrates, less than 240m of dietary cholesterol, 20-35 gm of total fiber daily;Understanding of distribution of calorie intake throughout the day with the consumption of 4-5 meals/snacks    Improve shortness of breath with ADL's  Yes    Intervention  Provide education, individualized exercise plan and daily activity instruction to help decrease symptoms of SOB with activities of daily living.    Expected Outcomes  Short Term: Improve cardiorespiratory fitness to achieve a reduction of symptoms when performing ADLs;Long Term: Be able to perform more ADLs without  symptoms or delay the onset of symptoms    Intervention  Provide education on lifestyle modifcations including regular physical activity/exercise, weight management, moderate sodium restriction and increased consumption of fresh fruit, vegetables, and low fat dairy, alcohol moderation, and smoking cessation.;Monitor prescription use compliance.    Expected Outcomes  Short Term: Continued assessment and intervention until BP is < 140/934mHG in hypertensive participants. < 130/8095mG in hypertensive participants with diabetes, heart failure or chronic kidney disease.;Long Term: Maintenance of blood pressure at goal levels.    Intervention  Provide education and support for participant on nutrition & aerobic/resistive exercise along with prescribed medications to achieve LDL <52m73mDL >40mg40m Expected Outcomes  Short Term: Participant states understanding of desired cholesterol values and is compliant with medications prescribed. Participant is following exercise prescription and nutrition guidelines.;Long Term: Cholesterol controlled with medications as prescribed, with individualized exercise RX and with personalized nutrition plan. Value goals: LDL < 52mg,34m > 40 mg.       Core Components/Risk Factors/Patient Goals Review:    Core Components/Risk Factors/Patient Goals at Discharge (Final Review):    ITP Comments: ITP Comments    Row Name 09/21/18 0824 09/21/18 0832 09/22/18 1218 10/13/18 0721     ITP Comments  [facility] statins.  Not sure what he is taking, but numbers seem to be good.  Virtual orientation visit completed.  [facility]  Completed 6MWT and nutrition eval Initial ITP created and sent to Dr MillerSabra HeckDay Review Completed today. Continue with ITP unless changed by Medical Director review.       Comments:

## 2018-10-14 ENCOUNTER — Encounter (INDEPENDENT_AMBULATORY_CARE_PROVIDER_SITE_OTHER): Payer: Medicare Other | Admitting: Cardiology

## 2018-10-14 ENCOUNTER — Ambulatory Visit: Payer: Self-pay | Admitting: Pharmacist

## 2018-10-14 DIAGNOSIS — Z7901 Long term (current) use of anticoagulants: Secondary | ICD-10-CM

## 2018-10-14 DIAGNOSIS — I359 Nonrheumatic aortic valve disorder, unspecified: Secondary | ICD-10-CM

## 2018-10-14 LAB — POCT INR
INR: 1.9 — AB (ref 2.0–3.0)
INR: 1.9 — AB (ref 2.0–3.0)

## 2018-10-14 NOTE — Progress Notes (Signed)
This encounter was created in error - please disregard.

## 2018-10-15 ENCOUNTER — Other Ambulatory Visit: Payer: Self-pay

## 2018-10-15 ENCOUNTER — Encounter: Payer: Medicare Other | Admitting: *Deleted

## 2018-10-15 DIAGNOSIS — J449 Chronic obstructive pulmonary disease, unspecified: Secondary | ICD-10-CM | POA: Diagnosis not present

## 2018-10-15 NOTE — Progress Notes (Signed)
Daily Session Note  Patient Details  Name: ARTYOM STENCEL MRN: [id number] Date of Birth: 28-Dec-1950 Referring Provider:     Cardiac Rehab from 09/22/2018 in [facility] Cardiac and Pulmonary Rehab  Referring Provider  [name]      Encounter Date: 10/15/2018  Check In: Session Check In - 10/15/18 0923      Check-In   Supervising physician immediately available to respond to emergencies  See telemetry face sheet for immediately available ER MD    Location  [facility]-Cardiac & Pulmonary Rehab    Staff Present  Heath Lark, RN, BSN, CCRP;Jessica [place], MA, RCEP, CCRP, [place], [place], ACSM CEP, Exercise Physiologist    Virtual Visit  No    Medication changes reported      No    Fall or balance concerns reported     No    Warm-up and Cool-down  Performed on first and last piece of equipment    Resistance Training Performed  Yes    VAD Patient?  No    PAD/SET Patient?  No      Pain Assessment   Currently in Pain?  No/denies          Social History   Tobacco Use  Smoking Status Never Smoker  Smokeless Tobacco Never Used    Goals Met:  Independence with exercise equipment Exercise tolerated well No report of cardiac concerns or symptoms  Goals Unmet:  Not Applicable  Comments: Pt able to follow exercise prescription today without complaint.  Will continue to monitor for progression.    Dr. Emily Filbert is Medical Director for [place] and [facility].

## 2018-10-18 ENCOUNTER — Other Ambulatory Visit: Payer: Self-pay

## 2018-10-18 ENCOUNTER — Encounter: Payer: Medicare Other | Admitting: *Deleted

## 2018-10-18 DIAGNOSIS — J449 Chronic obstructive pulmonary disease, unspecified: Secondary | ICD-10-CM

## 2018-10-18 DIAGNOSIS — R0600 Dyspnea, unspecified: Secondary | ICD-10-CM

## 2018-10-18 NOTE — Progress Notes (Signed)
2Daily Session Note  Patient Details  Name: Alexander Barton MRN: [id number] Date of Birth: [birth year] Referring Provider:     Cardiac Rehab from 09/22/2018 in [facility] Cardiac and Pulmonary Rehab  Referring Provider  [name]      Encounter Date: 10/18/2018  Check In: Session Check In - 10/18/18 0910      Check-In   Supervising physician immediately available to respond to emergencies  See telemetry face sheet for immediately available ER MD    Location  [facility]-Cardiac & Pulmonary Rehab    Staff Present  Heath Lark, RN, BSN, Laveda Norman, BS, ACSM CEP, Exercise Physiologist;Joseph Tessie Fass RCP,RRT,BSRT    Virtual Visit  No    Medication changes reported      No    Fall or balance concerns reported     No    Warm-up and Cool-down  Performed on first and last piece of equipment    Resistance Training Performed  Yes    VAD Patient?  No    PAD/SET Patient?  No      Pain Assessment   Currently in Pain?  No/denies          Social History   Tobacco Use  Smoking Status Never Smoker  Smokeless Tobacco Never Used    Goals Met:  Independence with exercise equipment Exercise tolerated well No report of cardiac concerns or symptoms  Goals Unmet:  Not Applicable  Comments: Pt able to follow exercise prescription today without complaint.  Will continue to monitor for progression.    Dr. Emily Filbert is Medical Director for [place] and [facility].

## 2018-10-19 ENCOUNTER — Other Ambulatory Visit: Payer: Self-pay | Admitting: Family Medicine

## 2018-10-20 ENCOUNTER — Encounter: Payer: Medicare Other | Admitting: *Deleted

## 2018-10-20 ENCOUNTER — Other Ambulatory Visit: Payer: Self-pay

## 2018-10-20 DIAGNOSIS — J449 Chronic obstructive pulmonary disease, unspecified: Secondary | ICD-10-CM

## 2018-10-20 DIAGNOSIS — R0600 Dyspnea, unspecified: Secondary | ICD-10-CM

## 2018-10-20 NOTE — Progress Notes (Signed)
Daily Session Note  Patient Details  Name: WILLOW RECZEK MRN: [id number] Date of Birth: Jun 18, 1950 Referring Provider:     Cardiac Rehab from 09/22/2018 in [facility] Cardiac and Pulmonary Rehab  Referring Provider  [name]      Encounter Date: 10/20/2018  Check In: Session Check In - 10/20/18 0912      Check-In   Supervising physician immediately available to respond to emergencies  See telemetry face sheet for immediately available ER MD    Location  [facility]-Cardiac & Pulmonary Rehab    Staff Present  Alberteen Sam, MA, RCEP, CCRP, Kathyrn Drown, RN BSN;Joseph [place]    Virtual Visit  No    Medication changes reported      No    Fall or balance concerns reported     No    Warm-up and Cool-down  Performed on first and last piece of equipment    Resistance Training Performed  Yes    VAD Patient?  No    PAD/SET Patient?  No      Pain Assessment   Currently in Pain?  No/denies          Social History   Tobacco Use  Smoking Status Never Smoker  Smokeless Tobacco Never Used    Goals Met:  Proper associated with RPD/PD & O2 Sat Independence with exercise equipment Using PLB without cueing & demonstrates good technique Exercise tolerated well Strength training completed today  Goals Unmet:  Not Applicable  Comments: Pt able to follow exercise prescription today without complaint.  Will continue to monitor for progression.    Dr. Emily Filbert is Medical Director for [place] and [facility].

## 2018-10-22 ENCOUNTER — Other Ambulatory Visit: Payer: Self-pay | Admitting: Family Medicine

## 2018-10-22 ENCOUNTER — Other Ambulatory Visit: Payer: Self-pay

## 2018-10-22 ENCOUNTER — Other Ambulatory Visit: Payer: Self-pay | Admitting: Cardiology

## 2018-10-22 DIAGNOSIS — J449 Chronic obstructive pulmonary disease, unspecified: Secondary | ICD-10-CM

## 2018-10-22 DIAGNOSIS — R0600 Dyspnea, unspecified: Secondary | ICD-10-CM

## 2018-10-22 NOTE — Progress Notes (Signed)
Daily Session Note  Patient Details  Name: Alexander Barton MRN: [id number] Date of Birth: 08/06/1950 Referring Provider:     Cardiac Rehab from 09/22/2018 in [facility] Cardiac and Pulmonary Rehab  Referring Provider  [name]      Encounter Date: 10/22/2018  Check In: Session Check In - 10/22/18 0920      Check-In   Supervising physician immediately available to respond to emergencies  See telemetry face sheet for immediately available ER MD    Location  [facility]-Cardiac & Pulmonary Rehab    Staff Present  Vida Rigger RN, BSN;Jessica Luan [place], MA, RCEP, CCRP, CCET;Joseph Hood RCP,RRT,BSRT    Virtual Visit  No    Medication changes reported      No    Fall or balance concerns reported     No    Warm-up and Cool-down  Performed on first and last piece of equipment    Resistance Training Performed  Yes    VAD Patient?  No    PAD/SET Patient?  No      Pain Assessment   Currently in Pain?  No/denies    Multiple Pain Sites  No          Social History   Tobacco Use  Smoking Status Never Smoker  Smokeless Tobacco Never Used    Goals Met:  Proper associated with RPD/PD & O2 Sat Independence with exercise equipment Exercise tolerated well No report of cardiac concerns or symptoms Strength training completed today  Goals Unmet:  Not Applicable  Comments: Pt able to follow exercise prescription today without complaint.  Will continue to monitor for progression.   Dr. Emily Filbert is Medical Director for [place] and [facility].

## 2018-10-25 ENCOUNTER — Encounter: Payer: Medicare Other | Admitting: *Deleted

## 2018-10-25 ENCOUNTER — Other Ambulatory Visit: Payer: Self-pay

## 2018-10-25 DIAGNOSIS — R0600 Dyspnea, unspecified: Secondary | ICD-10-CM

## 2018-10-25 DIAGNOSIS — J449 Chronic obstructive pulmonary disease, unspecified: Secondary | ICD-10-CM

## 2018-10-25 NOTE — Progress Notes (Signed)
Daily Session Note  Patient Details  Name: KLARK VANDERHOEF MRN: [id number] Date of Birth: 1950/04/30 Referring Provider:     Cardiac Rehab from 09/22/2018 in [facility] Cardiac and Pulmonary Rehab  Referring Provider  [name]      Encounter Date: 10/25/2018  Check In: Session Check In - 10/25/18 0911      Check-In   Supervising physician immediately available to respond to emergencies  See telemetry face sheet for immediately available ER MD    Location  [facility]-Cardiac & Pulmonary Rehab    Staff Present  Heath Lark, RN, BSN, Laveda Norman, BS, ACSM CEP, Exercise Physiologist;Joseph Tessie Fass RCP,RRT,BSRT    Virtual Visit  No    Medication changes reported      No    Fall or balance concerns reported     No    Warm-up and Cool-down  Performed on first and last piece of equipment    Resistance Training Performed  Yes    VAD Patient?  No    PAD/SET Patient?  No      Pain Assessment   Currently in Pain?  No/denies          Social History   Tobacco Use  Smoking Status Never Smoker  Smokeless Tobacco Never Used    Goals Met:  Independence with exercise equipment Exercise tolerated well No report of cardiac concerns or symptoms  Goals Unmet:  Not Applicable  Comments: Pt able to follow exercise prescription today without complaint.  Will continue to monitor for progression.    Dr. Emily Filbert is Medical Director for [place] and [facility].

## 2018-10-27 ENCOUNTER — Other Ambulatory Visit: Payer: Self-pay

## 2018-10-27 ENCOUNTER — Encounter: Payer: Medicare Other | Admitting: *Deleted

## 2018-10-27 DIAGNOSIS — J449 Chronic obstructive pulmonary disease, unspecified: Secondary | ICD-10-CM

## 2018-10-27 DIAGNOSIS — R0600 Dyspnea, unspecified: Secondary | ICD-10-CM

## 2018-10-27 NOTE — Progress Notes (Signed)
Daily Session Note  Patient Details  Name: Alexander Barton MRN: [id number] Date of Birth: Jun 25, 1950 Referring Provider:     Cardiac Rehab from 09/22/2018 in [facility] Cardiac and Pulmonary Rehab  Referring Provider  [name]      Encounter Date: 10/27/2018  Check In: Session Check In - 10/27/18 0915      Check-In   Supervising physician immediately available to respond to emergencies  See telemetry face sheet for immediately available ER MD    Location  [facility]-Cardiac & Pulmonary Rehab    Staff Present  Heath Lark, RN, BSN, CCRP;Jessica [place], MA, RCEP, CCRP, CCET;Joseph [place] RCP,RRT,BSRT    Virtual Visit  No    Medication changes reported      No    Fall or balance concerns reported     No    Warm-up and Cool-down  Performed on first and last piece of equipment    Resistance Training Performed  Yes    VAD Patient?  No    PAD/SET Patient?  No      Pain Assessment   Currently in Pain?  No/denies          Social History   Tobacco Use  Smoking Status Never Smoker  Smokeless Tobacco Never Used    Goals Met:  Independence with exercise equipment Exercise tolerated well No report of cardiac concerns or symptoms  Goals Unmet:  Not Applicable  Comments: Pt able to follow exercise prescription today without complaint.  Will continue to monitor for progression.    Dr. Emily Filbert is Medical Director for [place] and [facility].

## 2018-10-29 ENCOUNTER — Ambulatory Visit (INDEPENDENT_AMBULATORY_CARE_PROVIDER_SITE_OTHER): Payer: Medicare Other | Admitting: Pharmacist

## 2018-10-29 ENCOUNTER — Telehealth: Payer: Self-pay | Admitting: Cardiology

## 2018-10-29 ENCOUNTER — Other Ambulatory Visit: Payer: Self-pay

## 2018-10-29 DIAGNOSIS — I359 Nonrheumatic aortic valve disorder, unspecified: Secondary | ICD-10-CM | POA: Diagnosis not present

## 2018-10-29 DIAGNOSIS — J449 Chronic obstructive pulmonary disease, unspecified: Secondary | ICD-10-CM

## 2018-10-29 DIAGNOSIS — Z7901 Long term (current) use of anticoagulants: Secondary | ICD-10-CM

## 2018-10-29 LAB — POCT INR: INR: 1.9 — AB (ref 2.0–3.0)

## 2018-10-29 NOTE — Telephone Encounter (Signed)
New message  Patient called in stating that he was returning Raquel's call. Please give patient a call back.

## 2018-10-29 NOTE — Telephone Encounter (Signed)
See coumadin clinic note 

## 2018-10-29 NOTE — Progress Notes (Signed)
Daily Session Note  Patient Details  Name: Alexander Barton MRN: [id number] Date of Birth: 1950-07-29 Referring Provider:     Cardiac Rehab from 09/22/2018 in [facility] Cardiac and Pulmonary Rehab  Referring Provider  [name]      Encounter Date: 10/29/2018  Check In:      Social History   Tobacco Use  Smoking Status Never Smoker  Smokeless Tobacco Never Used    Goals Met:  Proper associated with RPD/PD & O2 Sat Independence with exercise equipment Exercise tolerated well No report of cardiac concerns or symptoms Strength training completed today  Goals Unmet:  Not Applicable  Comments: Pt able to follow exercise prescription today without complaint.  Will continue to monitor for progression.   Dr. Emily Filbert is Medical Director for [place] and [facility].

## 2018-11-01 ENCOUNTER — Other Ambulatory Visit: Payer: Self-pay

## 2018-11-01 ENCOUNTER — Encounter: Payer: Medicare Other | Admitting: *Deleted

## 2018-11-01 DIAGNOSIS — R0600 Dyspnea, unspecified: Secondary | ICD-10-CM

## 2018-11-01 DIAGNOSIS — J449 Chronic obstructive pulmonary disease, unspecified: Secondary | ICD-10-CM | POA: Diagnosis not present

## 2018-11-01 NOTE — Progress Notes (Signed)
Daily Session Note  Patient Details  Name: Alexander Barton MRN: [id number] Date of Birth: 07/10/1950 Referring Provider:     Cardiac Rehab from 09/22/2018 in [facility]  Referring Provider  [name]      Encounter Date: 11/01/2018  Check In: Session Check In - 11/01/18 5993      Check-In   Supervising physician immediately available to respond to emergencies  See telemetry face sheet for immediately available ER MD    Location  [facility]-Cardiac & Pulmonary Rehab    Staff Present  Earlean Shawl, BS, ACSM CEP, Exercise Physiologist;Joseph Hood RCP,RRT,BSRT;Carroll Enterkin, RN, BSN-BC, CCRP    Virtual Visit  No    Medication changes reported      No    Fall or balance concerns reported     No    Warm-up and Cool-down  Performed on first and last piece of equipment    Resistance Training Performed  Yes    VAD Patient?  No    PAD/SET Patient?  No      Pain Assessment   Currently in Pain?  No/denies    Multiple Pain Sites  No          Social History   Tobacco Use  Smoking Status Never Smoker  Smokeless Tobacco Never Used    Goals Met:  Independence with exercise equipment Exercise tolerated well No report of cardiac concerns or symptoms Strength training completed today  Goals Unmet:  Not Applicable  Comments: Pt able to follow exercise prescription today without complaint.  Will continue to monitor for progression.  Bill did report extra shortness of breath this morning while getting ready for the day. His weight was the same and vital signs were within acceptable ranges. Exercise was tolerated but with a little extra shortness of breath. Rush Landmark was advised to call his doctor and report this change in symptoms.     Dr. Emily Filbert is Medical Director for [facility].

## 2018-11-03 ENCOUNTER — Other Ambulatory Visit: Payer: Self-pay

## 2018-11-03 ENCOUNTER — Encounter: Payer: Medicare Other | Admitting: *Deleted

## 2018-11-03 DIAGNOSIS — J449 Chronic obstructive pulmonary disease, unspecified: Secondary | ICD-10-CM | POA: Diagnosis not present

## 2018-11-03 DIAGNOSIS — R0600 Dyspnea, unspecified: Secondary | ICD-10-CM

## 2018-11-03 NOTE — Progress Notes (Signed)
Daily Session Note  Patient Details  Name: ROBINSON BRINKLEY MRN: [id number] Date of Birth: 1950-06-20 Referring Provider:     Cardiac Rehab from 09/22/2018 in [facility]iac and [facility] Rehab  Referring Provider  [name]      Encounter Date: 11/03/2018  Check In: Session Check In - 11/03/18 0917      Check-In   Supervising physician immediately available to respond to emergencies  See telemetry face sheet for immediately available ER MD    Location  [facility]    Staff Present  Darel Hong, RN BSN;Jessica Luan Pulling, MA, RCEP, CCRP, CCET;Joseph Oronogo RCP,RRT,BSRT    Virtual Visit  No    Medication changes reported      No    Fall or balance concerns reported     No    Warm-up and Cool-down  Performed on first and last piece of equipment    Resistance Training Performed  Yes    VAD Patient?  No    PAD/SET Patient?  No      Pain Assessment   Currently in Pain?  No/denies          Social History   Tobacco Use  Smoking Status Never Smoker  Smokeless Tobacco Never Used    Goals Met:  Proper associated with RPD/PD & O2 Sat Independence with exercise equipment Using PLB without cueing & demonstrates good technique Exercise tolerated well Strength training completed today  Goals Unmet:  Not Applicable  Comments: Pt able to follow exercise prescription today without complaint.  Will continue to monitor for progression.    Dr. Emily Filbert is Medical Director for [facility].

## 2018-11-05 ENCOUNTER — Encounter: Payer: Medicare Other | Attending: Family Medicine

## 2018-11-05 DIAGNOSIS — Z7982 Long term (current) use of aspirin: Secondary | ICD-10-CM | POA: Insufficient documentation

## 2018-11-05 DIAGNOSIS — Z8673 Personal history of transient ischemic attack (TIA), and cerebral infarction without residual deficits: Secondary | ICD-10-CM | POA: Insufficient documentation

## 2018-11-05 DIAGNOSIS — E7800 Pure hypercholesterolemia, unspecified: Secondary | ICD-10-CM | POA: Insufficient documentation

## 2018-11-05 DIAGNOSIS — Z7901 Long term (current) use of anticoagulants: Secondary | ICD-10-CM | POA: Insufficient documentation

## 2018-11-05 DIAGNOSIS — I10 Essential (primary) hypertension: Secondary | ICD-10-CM | POA: Insufficient documentation

## 2018-11-05 DIAGNOSIS — Z7951 Long term (current) use of inhaled steroids: Secondary | ICD-10-CM | POA: Insufficient documentation

## 2018-11-05 DIAGNOSIS — J449 Chronic obstructive pulmonary disease, unspecified: Secondary | ICD-10-CM | POA: Insufficient documentation

## 2018-11-05 DIAGNOSIS — K219 Gastro-esophageal reflux disease without esophagitis: Secondary | ICD-10-CM | POA: Insufficient documentation

## 2018-11-05 DIAGNOSIS — Z79899 Other long term (current) drug therapy: Secondary | ICD-10-CM | POA: Insufficient documentation

## 2018-11-08 ENCOUNTER — Other Ambulatory Visit: Payer: Self-pay

## 2018-11-08 ENCOUNTER — Telehealth: Payer: Self-pay

## 2018-11-08 DIAGNOSIS — Z8616 Personal history of COVID-19: Secondary | ICD-10-CM

## 2018-11-08 DIAGNOSIS — Z20828 Contact with and (suspected) exposure to other viral communicable diseases: Secondary | ICD-10-CM

## 2018-11-08 DIAGNOSIS — Z20822 Contact with and (suspected) exposure to covid-19: Secondary | ICD-10-CM

## 2018-11-08 HISTORY — DX: Personal history of COVID-19: Z86.16

## 2018-11-08 NOTE — Telephone Encounter (Signed)
I am fine with him being placed in at 3:15. If he gets worse he needs to be seen prior to then. He needs to quarantine at home until we complete the visit. This means that he should not leave the house except for seeking medical attention. They should also not have any visitors as well. Ideally his wife would quarantine as well. We could also go ahead and get him tested for COVID19 as I will want to do that following his visit. Please give him the testing site information. Thanks.

## 2018-11-08 NOTE — Telephone Encounter (Signed)
COVID testing ordered.

## 2018-11-08 NOTE — Telephone Encounter (Signed)
Copied from [place] [id number]. Topic: General - Other >> Nov 08, 2018  8:47 AM Nils Flack wrote: Reason for CRM: pt is having congestion and sinus issues after 4 wheeling over the weekend.  Wife wants him to be seen.  Pt is having open heart surgery this month and needs to be well for that.  Please call wife at [phone]

## 2018-11-08 NOTE — Telephone Encounter (Signed)
Called and spoke to patient.  Patient c/o having a cough with a rattling in his chest, difficulty breathing last night, patient said that he is coughing up little mucus on Saturday which was yellow.  Patient said that mucus from his nose is clear.  Patient thinks he may have bronchitis.    Patient is using a diffuser which he said is helping with his breathing.  Patient said that he has open heart surgery scheduled on Oct. 29th.  Patient said that he has needs to be healthy for surgery.  Informed patient that there are no appts available in this clinic today or later this week except for a 3:15 pm appt on Wednesday but I will need to get advisement from Dr. [name] if he would be ok for patient to wait to be seen and also need to get an override for the appt time.  Patient said that he would be ok to wait until Wednesday to be seen.  Advised patient that someone will call him back with Dr. Ellen Henri recommendation.  Advised patient that he should go to urgent care if symptoms worsen.

## 2018-11-08 NOTE — Telephone Encounter (Signed)
Called and spoke to patient.  Patient was scheduled a virtual visit w/ PCP on 11/10/18 @ 3:15 pm.  Patient understands that he should be seen at urgent care prior to visit if symptoms worsen.  Patient is agreeable to be tested for COVID and said he will go to drive thru site at [facility] this afternoon.  Patient and wife understands that they should quarantine.

## 2018-11-10 ENCOUNTER — Encounter: Payer: Self-pay | Admitting: *Deleted

## 2018-11-10 ENCOUNTER — Other Ambulatory Visit: Payer: Self-pay

## 2018-11-10 ENCOUNTER — Ambulatory Visit (INDEPENDENT_AMBULATORY_CARE_PROVIDER_SITE_OTHER): Payer: Medicare Other | Admitting: Family Medicine

## 2018-11-10 VITALS — Wt 215.0 lb

## 2018-11-10 DIAGNOSIS — U071 COVID-19: Secondary | ICD-10-CM | POA: Diagnosis not present

## 2018-11-10 DIAGNOSIS — J449 Chronic obstructive pulmonary disease, unspecified: Secondary | ICD-10-CM

## 2018-11-10 LAB — NOVEL CORONAVIRUS, NAA: SARS-CoV-2, NAA: DETECTED — AB

## 2018-11-10 NOTE — Progress Notes (Signed)
Virtual Visit via video Note  This visit type was conducted due to national recommendations for restrictions regarding the COVID-19 pandemic (e.g. social distancing).  This format is felt to be most appropriate for this patient at this time.  All issues noted in this document were discussed and addressed.  No physical exam was performed (except for noted visual exam findings with Video Visits).   I connected with Alexander Barton today at  3:15 PM EDT by a video enabled telemedicine application or telephone and verified that I am speaking with the correct person using two identifiers. Location patient: home Location provider: work Persons participating in the virtual visit: patient, provider, Bacilio Rhyner (wife)  I discussed the limitations, risks, security and privacy concerns of performing an evaluation and management service by telephone and the availability of in person appointments. I also discussed with the patient that there may be a patient responsible charge related to this service. The patient expressed understanding and agreed to proceed.   Reason for visit: same day visit  HPI: [id number]: Patient notes symptoms starting on 11/06/2018.  Has had mild cough with mild productive mucus.  He feels mucus in his throat and chest.  Notes it is hard to get it to bulge.  He feels overall weak.  He does have some smell loss though his taste is okay.  No fevers.  He is blowing yellow mucus out of his nose.  He does note the last couple of nights he had some trouble breathing when lying down though last night this was improved and he was able to sleep in his bed.  No known COVID-19 exposure though he does do cardiac rehab and notes they had to cancel his sessions last week as 1 of the workers was exposed to somebody who tested positive for COVID-19.   ROS: See pertinent positives and negatives per HPI.  Past Medical History:  Diagnosis Date  . Aortic aneurysm, thoracic ([place])   . Aortic  valve disease   . Arthritis   . BPH (benign prostatic hyperplasia)   . COPD (chronic obstructive pulmonary disease) ([place])   . GERD (gastroesophageal reflux disease)   . HTN (hypertension)   . Hypercholesterolemia   . Stroke ([place])   . TIA (transient ischemic attack)     Past Surgical History:  Procedure Laterality Date  . ACHILLES TENDON REPAIR    . AORTIC VALVE REPLACEMENT     #23 On-X valve conduit  . ASCENDING AORTIC ANEURYSM REPAIR    . BACK SURGERY    . FOOT SURGERY      Family History  Problem Relation Age of Onset  . Alzheimer's disease Father   . Diabetes Father        age onset DM  . Hypertension Sister   . Hypertension Brother   . Diabetes Mother   . Diabetes Sister   . Colon cancer Neg Hx     SOCIAL HX: Non-smoker   Current Outpatient Medications:  .  albuterol (VENTOLIN HFA) 108 (90 Base) MCG/ACT inhaler, Inhale 2 puffs into the lungs every 4 (four) hours as needed for wheezing or shortness of breath., Disp: 18 g, Rfl: 1 .  amLODipine (NORVASC) 5 MG tablet, TAKE 1 TABLET BY MOUTH EVERY DAY, Disp: 90 tablet, Rfl: 1 .  aspirin 81 MG tablet, Take 81 mg by mouth at bedtime. , Disp: , Rfl:  .  benzonatate (TESSALON) 100 MG capsule, Take 1 or 2 capsules every 8 hours as needed for cough, Disp:  30 capsule, Rfl: 1 .  budesonide-formoterol (SYMBICORT) 160-4.5 MCG/ACT inhaler, TAKE 2 PUFFS BY MOUTH TWICE A DAY, Disp: 30.6 Inhaler, Rfl: 4 .  enoxaparin (LOVENOX) 100 MG/ML injection, Inject 1 mL (100 mg total) into the skin every 12 (twelve) hours. As instructed by coumadin clinic, Disp: 20 mL, Rfl: 0 .  furosemide (LASIX) 20 MG tablet, TAKE 1/2 TABLET (10 MG) 1 OR 2 TIMES A DAY AS NEEDED FOR LEG SWELLING, Disp: 90 tablet, Rfl: 1 .  metoprolol succinate (TOPROL-XL) 50 MG 24 hr tablet, Take 1 tablet (50 mg total) by mouth daily. Take with or immediately following a meal., Disp: 90 tablet, Rfl: 1 .  NON FORMULARY, , Disp: , Rfl:  .  Polyethylene Glycol 3350 (MIRALAX PO),  Take by mouth., Disp: , Rfl:  .  senna-docusate (SENOKOT-S) 8.6-50 MG tablet, Take by mouth., Disp: , Rfl:  .  tamsulosin (FLOMAX) 0.4 MG CAPS capsule, TAKE 2 CAPSULES BY MOUTH EVERY DAY, Disp: 174 capsule, Rfl: 1 .  Tiotropium Bromide Monohydrate (SPIRIVA RESPIMAT) 2.5 MCG/ACT AERS, INHALE 2.5 MCG INTO THE LUNGS 2 (TWO) TIMES DAILY., Disp: 1 Inhaler, Rfl: 5 .  warfarin (COUMADIN) 2 MG tablet, TAKE 1 TABLET WITH 5 MG TABLET DAILY AS DIRECTED BY COUMADIN CLINIC, Disp: 90 tablet, Rfl: 0 .  warfarin (COUMADIN) 5 MG tablet, TAKE 1 TABLET DAILY AS DIRECTED WITH 2 MG TABLET, Disp: 90 tablet, Rfl: 1  EXAM:  VITALS per patient if applicable: None.  GENERAL: alert, oriented, appears well and in no acute distress  HEENT: atraumatic, conjunttiva clear, no obvious abnormalities on inspection of external nose and ears  NECK: normal movements of the head and neck  LUNGS: on inspection no signs of respiratory distress, breathing rate appears normal, no obvious gross SOB, gasping or wheezing  CV: no obvious cyanosis  MS: moves all visible extremities without noticeable abnormality  PSYCH/NEURO: pleasant and cooperative, no obvious depression or anxiety, speech and thought processing grossly intact  ASSESSMENT AND PLAN:  Discussed the following assessment and plan:  COVID-19 Patient with positive COVID-19 testing.  Symptoms are consistent with this.  I discussed supportive care with him.  I discussed strict quarantine precautions for him and his wife.  I discussed that he should quarantine until it has been at least 10 days since the onset of his symptoms and he has had at least 3 days of no fever without the use of Tylenol or ibuprofen and has had at least 3 days of improved symptoms.  I discussed that his wife needs to quarantine from now until 14 days after the patient's symptoms improve to ensure that she does not develop any symptoms.  I advised that if she develops symptoms they need to contact us  so we can get her evaluated.  He can continue Mucinex.  He does report some mild wheezing and I discussed he could use his albuterol inhaler for this.  He was advised to seek medical attention if he develops increasing trouble breathing, excessively high fevers, or if he starts to feel increasingly ill.  Health department form sent through my chart.  My chart monitoring set up as well.  We will confirm that this positive result has been reported to the health department.  The patient will contact his cardiac surgeon at [facility] and let them know of the positive test.  He is scheduled to have a surgery at the end of this month.    I discussed the assessment and treatment plan with the patient.  The patient was provided an opportunity to ask questions and all were answered. The patient agreed with the plan and demonstrated an understanding of the instructions.   The patient was advised to call back or seek an in-person evaluation if the symptoms worsen or if the condition fails to improve as anticipated.   [name], MD

## 2018-11-10 NOTE — Progress Notes (Signed)
Pulmonary Individual Treatment Plan  Patient Details  Name: Alexander Barton MRN: [id number] Date of Birth: April 25, 1950 Referring Provider:     Cardiac Rehab from 09/22/2018 in [facility] Cardiac [facility]  Referring Provider  Sonnenberg      Initial Encounter Date:    Cardiac Rehab from 09/22/2018 in [facility] Cardiac and Pulmonary Rehab  Date  09/22/18      Visit Diagnosis: Chronic obstructive pulmonary disease, unspecified COPD type ([place])  Patient's Home Medications on Admission:  Current Outpatient Medications:  .  albuterol (VENTOLIN HFA) 108 (90 Base) MCG/ACT inhaler, Inhale 2 puffs into the lungs every 4 (four) hours as needed for wheezing or shortness of breath., Disp: 18 g, Rfl: 1 .  amLODipine (NORVASC) 5 MG tablet, TAKE 1 TABLET BY MOUTH EVERY DAY, Disp: 90 tablet, Rfl: 1 .  aspirin 81 MG tablet, Take 81 mg by mouth at bedtime. , Disp: , Rfl:  .  benzonatate (TESSALON) 100 MG capsule, Take 1 or 2 capsules every 8 hours as needed for cough, Disp: 30 capsule, Rfl: 1 .  budesonide-formoterol (SYMBICORT) 160-4.5 MCG/ACT inhaler, TAKE 2 PUFFS BY MOUTH TWICE A DAY, Disp: 30.6 Inhaler, Rfl: 4 .  doxycycline (VIBRA-TABS) 100 MG tablet, Take 1 tablet (100 mg total) by mouth 2 (two) times daily., Disp: 20 tablet, Rfl: 0 .  enoxaparin (LOVENOX) 100 MG/ML injection, Inject 1 mL (100 mg total) into the skin every 12 (twelve) hours. As instructed by coumadin clinic, Disp: 20 mL, Rfl: 0 .  furosemide (LASIX) 20 MG tablet, TAKE 1/2 TABLET (10 MG) 1 OR 2 TIMES A DAY AS NEEDED FOR LEG SWELLING, Disp: 90 tablet, Rfl: 1 .  metoprolol succinate (TOPROL-XL) 50 MG 24 hr tablet, Take 1 tablet (50 mg total) by mouth daily. Take with or immediately following a meal., Disp: 90 tablet, Rfl: 1 .  NON FORMULARY, , Disp: , Rfl:  .  Polyethylene Glycol 3350 (MIRALAX PO), Take by mouth., Disp: , Rfl:  .  senna-docusate (SENOKOT-S) 8.6-50 MG tablet, Take by mouth., Disp: , Rfl:  .  tamsulosin (FLOMAX) 0.4  MG CAPS capsule, TAKE 2 CAPSULES BY MOUTH EVERY DAY, Disp: 174 capsule, Rfl: 1 .  Tiotropium Bromide Monohydrate (SPIRIVA RESPIMAT) 2.5 MCG/ACT AERS, INHALE 2.5 MCG INTO THE LUNGS 2 (TWO) TIMES DAILY., Disp: 1 Inhaler, Rfl: 5 .  warfarin (COUMADIN) 2 MG tablet, TAKE 1 TABLET WITH 5 MG TABLET DAILY AS DIRECTED BY COUMADIN CLINIC, Disp: 90 tablet, Rfl: 0 .  warfarin (COUMADIN) 5 MG tablet, TAKE 1 TABLET DAILY AS DIRECTED WITH 2 MG TABLET, Disp: 90 tablet, Rfl: 1  Past Medical History: Past Medical History:  Diagnosis Date  . Aortic aneurysm, thoracic ([place])   . Aortic valve disease   . Arthritis   . BPH (benign prostatic hyperplasia)   . COPD (chronic obstructive pulmonary disease) ([place])   . GERD (gastroesophageal reflux disease)   . HTN (hypertension)   . Hypercholesterolemia   . Stroke ([place])   . TIA (transient ischemic attack)     Tobacco Use: Social History   Tobacco Use  Smoking Status Never Smoker  Smokeless Tobacco Never Used    Labs: Recent Review Flowsheet Data    Labs for ITP Cardiac and Pulmonary Rehab Latest Ref Rng & Units 06/04/2012 11/14/2014 10/03/2015 12/10/2016 12/22/2016   Cholestrol 0 - 200 mg/dL 247(H) 254(H) 174 253(H) -   LDLCALC 0 - 99 mg/dL 169(H) - 87 185(H) -   LDLDIRECT mg/dL - 172.0 - - -  HDL >39.00 mg/dL 45 48.50 47.20 45.00 -   Trlycerides 0.0 - 149.0 mg/dL 165(H) 202.0(H) 195.0(H) 112.0 -   Hemoglobin A1c 4.6 - 6.5 % 5.9(H) - 6.2 - 6.1       Pulmonary Assessment Scores: Pulmonary Assessment Scores    Row Name 09/22/18 1231         ADL UCSD   SOB Score total  62     Rest  1     Walk  2     Stairs  4     Bath  4     Dress  2     Shop  2       CAT Score   CAT Score  14       mMRC Score   mMRC Score  1        UCSD: Self-administered rating of dyspnea associated with activities of daily living (ADLs) 6-point scale (0 = "not at all" to 5 = "maximal or unable to do because of breathlessness")  Scoring Scores range from 0 to 120.   Minimally important difference is 5 units  CAT: CAT can identify the health impairment of COPD patients and is better correlated with disease progression.  CAT has a scoring range of zero to 40. The CAT score is classified into four groups of low (less than 10), medium (10 - 20), high (21-30) and very high (31-40) based on the impact level of disease on health status. A CAT score over 10 suggests significant symptoms.  A worsening CAT score could be explained by an exacerbation, poor medication adherence, poor inhaler technique, or progression of COPD or comorbid conditions.  CAT MCID is 2 points  mMRC: mMRC (Modified Medical Research Council) Dyspnea Scale is used to assess the degree of baseline functional disability in patients of respiratory disease due to dyspnea. No minimal important difference is established. A decrease in score of 1 point or greater is considered a positive change.   Pulmonary Function Assessment:   Exercise Target Goals: Exercise Program Goal: Individual exercise prescription set using results from initial 6 min walk test and THRR while considering  patient's activity barriers and safety.   Exercise Prescription Goal: Initial exercise prescription builds to 30-45 minutes a day of aerobic activity, 2-3 days per week.  Home exercise guidelines will be given to patient during program as part of exercise prescription that the participant will acknowledge.  Activity Barriers & Risk Stratification: Activity Barriers & Cardiac Risk Stratification - 09/21/18 0810      Activity Barriers & Cardiac Risk Stratification   Activity Barriers  Deconditioning;Muscular Weakness;Joint Problems;Other (comment)    Comments  Hx CVA,  r knee pain       6 Minute Walk: 6 Minute Walk    Row Name 09/22/18 1220         6 Minute Walk   Phase  Initial     Distance  1765 feet     Walk Time  6 minutes     # of Rest Breaks  0     MPH  3.34     METS  4.06     RPE  17     Perceived  Dyspnea   3.5     VO2 Peak  14.21     Symptoms  No     Resting HR  78 bpm     Resting BP  102/58     Resting Oxygen Saturation   94 %  Exercise Oxygen Saturation  during 6 min walk  93 %     Max Ex. HR  121 bpm     Max Ex. BP  138/58     2 Minute Post BP  122/58       Interval HR   1 Minute HR  105     2 Minute HR  113     3 Minute HR  116     4 Minute HR  112     5 Minute HR  110     6 Minute HR  121     2 Minute Post HR  97     Interval Heart Rate?  Yes       Interval Oxygen   Interval Oxygen?  Yes     Baseline Oxygen Saturation %  94 %     1 Minute Oxygen Saturation %  93 %     1 Minute Liters of Oxygen  0 L     2 Minute Oxygen Saturation %  94 %     2 Minute Liters of Oxygen  0 L     3 Minute Oxygen Saturation %  94 %     3 Minute Liters of Oxygen  0 L     4 Minute Oxygen Saturation %  94 %     4 Minute Liters of Oxygen  0 L     5 Minute Oxygen Saturation %  95 %     5 Minute Liters of Oxygen  0 L     6 Minute Oxygen Saturation %  95 %     6 Minute Liters of Oxygen  0 L     2 Minute Post Oxygen Saturation %  96 %     2 Minute Post Liters of Oxygen  0 L       Oxygen Initial Assessment: Oxygen Initial Assessment - 09/21/18 0810      Home Oxygen   Home Oxygen Device  None    Sleep Oxygen Prescription  None    Home Exercise Oxygen Prescription  None    Home at Rest Exercise Oxygen Prescription  None      Initial 6 min Walk   Oxygen Used  None      Program Oxygen Prescription   Program Oxygen Prescription  None      Intervention   Short Term Goals  To learn and demonstrate proper pursed lip breathing techniques or other breathing techniques.;To learn and understand importance of monitoring SPO2 with pulse oximeter and demonstrate accurate use of the pulse oximeter.;To learn and understand importance of maintaining oxygen saturations>88%;To learn and demonstrate proper use of respiratory medications    Long  Term Goals  Verbalizes importance of monitoring  SPO2 with pulse oximeter and return demonstration;Exhibits proper breathing techniques, such as pursed lip breathing or other method taught during program session;Demonstrates proper use of MDI's;Compliance with respiratory medication;Maintenance of O2 saturations>88%       Oxygen Re-Evaluation: Oxygen Re-Evaluation    Row Name 09/24/18 0932 10/18/18 0926           Program Oxygen Prescription   Program Oxygen Prescription  -  None        Home Oxygen   Home Oxygen Device  -  None      Sleep Oxygen Prescription  -  None      Home Exercise Oxygen Prescription  -  None      Home at Rest Exercise Oxygen Prescription  -  None        Goals/Expected Outcomes   Short Term Goals  To learn and understand importance of monitoring SPO2 with pulse oximeter and demonstrate accurate use of the pulse oximeter.;To learn and understand importance of maintaining oxygen saturations>88%;To learn and demonstrate proper pursed lip breathing techniques or other breathing techniques.  To learn and understand importance of monitoring SPO2 with pulse oximeter and demonstrate accurate use of the pulse oximeter.;To learn and understand importance of maintaining oxygen saturations>88%;To learn and demonstrate proper pursed lip breathing techniques or other breathing techniques.;To learn and demonstrate proper use of respiratory medications      Long  Term Goals  Verbalizes importance of monitoring SPO2 with pulse oximeter and return demonstration;Maintenance of O2 saturations>88%;Exhibits proper breathing techniques, such as pursed lip breathing or other method taught during program session  Verbalizes importance of monitoring SPO2 with pulse oximeter and return demonstration;Maintenance of O2 saturations>88%;Exhibits proper breathing techniques, such as pursed lip breathing or other method taught during program session;Compliance with respiratory medication;Demonstrates proper use of MDI's      Comments  Reviewed PLB  technique with pt.  Talked about how it work and it's important to maintaining his exercise saturations.  He has really appreciated that difference that PLB has made for him.  Since using it more consistently, he has not needed to use his rescue inhaler.      Goals/Expected Outcomes  Short: Become more profiecient at using PLB.   Long: Become independent at using PLB.  Short: Continue to use PLB regularly.  Long: Continue to improve breathing.         Oxygen Discharge (Final Oxygen Re-Evaluation): Oxygen Re-Evaluation - 10/18/18 0926      Program Oxygen Prescription   Program Oxygen Prescription  None      Home Oxygen   Home Oxygen Device  None    Sleep Oxygen Prescription  None    Home Exercise Oxygen Prescription  None    Home at Rest Exercise Oxygen Prescription  None      Goals/Expected Outcomes   Short Term Goals  To learn and understand importance of monitoring SPO2 with pulse oximeter and demonstrate accurate use of the pulse oximeter.;To learn and understand importance of maintaining oxygen saturations>88%;To learn and demonstrate proper pursed lip breathing techniques or other breathing techniques.;To learn and demonstrate proper use of respiratory medications    Long  Term Goals  Verbalizes importance of monitoring SPO2 with pulse oximeter and return demonstration;Maintenance of O2 saturations>88%;Exhibits proper breathing techniques, such as pursed lip breathing or other method taught during program session;Compliance with respiratory medication;Demonstrates proper use of MDI's    Comments  He has really appreciated that difference that PLB has made for him.  Since using it more consistently, he has not needed to use his rescue inhaler.    Goals/Expected Outcomes  Short: Continue to use PLB regularly.  Long: Continue to improve breathing.       Initial Exercise Prescription: Initial Exercise Prescription - 09/22/18 1200      Date of Initial Exercise RX and Referring Provider    Date  09/22/18    Referring Provider  Sonnenberg      Treadmill   MPH  3.3    Grade  1    Minutes  15    METs  4      Elliptical   Level  1    Speed  3    Minutes  15      REL-XR  Level  3    Speed  50    Minutes  15    METs  4      T5 Nustep   Level  3    SPM  80    Minutes  15    METs  4      Prescription Details   Frequency (times per week)  3    Duration  Progress to 30 minutes of continuous aerobic without signs/symptoms of physical distress      Intensity   THRR 40-80% of Max Heartrate  107-136    Ratings of Perceived Exertion  11-15    Perceived Dyspnea  0-4      Progression   Progression  Continue to progress workloads to maintain intensity without signs/symptoms of physical distress.      Resistance Training   Training Prescription  Yes    Weight  5 lb    Reps  10-15       Perform Capillary Blood Glucose checks as needed.  Exercise Prescription Changes: Exercise Prescription Changes    Row Name 09/22/18 1200 10/05/18 1400 10/18/18 0900         Response to Exercise   Blood Pressure (Admit)  102/58  110/70  -     Blood Pressure (Exercise)  138/58  146/80  -     Blood Pressure (Exit)  122/58  126/64  -     Heart Rate (Admit)  78 bpm  80 bpm  -     Heart Rate (Exercise)  121 bpm  97 bpm  -     Heart Rate (Exit)  97 bpm  84 bpm  -     Oxygen Saturation (Admit)  94 %  95 %  -     Oxygen Saturation (Exercise)  93 %  94 %  -     Oxygen Saturation (Exit)  96 %  96 %  -     Rating of Perceived Exertion (Exercise)  17  12  -     Perceived Dyspnea (Exercise)  3.5  3  -     Symptoms  none  none  -     Duration  -  Continue with 30 min of aerobic exercise without signs/symptoms of physical distress.  -     Intensity  -  THRR unchanged  -       Progression   Progression  -  Continue to progress workloads to maintain intensity without signs/symptoms of physical distress.  -     Average METs  -  2.91  -       Resistance Training   Training  Prescription  -  Yes  -     Weight  -  5 lbs  -     Reps  -  10-15  -       Interval Training   Interval Training  -  No  -       Treadmill   MPH  -  2.8  -     Grade  -  1  -     Minutes  -  15  -     METs  -  3.53  -       Elliptical   Level  -  1  -     Speed  -  2.5  -     Minutes  -  15  -       REL-XR   Level  -  4  -     Minutes  -  15  -     METs  -  3.1  -       T5 Nustep   Level  -  3  -     Minutes  -  15  -     METs  -  3.1  -       Home Exercise Plan   Plans to continue exercise at  -  -  Home (comment) walking     Frequency  -  -  Add 2 additional days to program exercise sessions.     Initial Home Exercises Provided  -  -  10/07/18        Exercise Comments: Exercise Comments    Row Name 09/24/18 0931           Exercise Comments  First full day of exercise!  Patient was oriented to gym and equipment including functions, settings, policies, and procedures.  Patient's individual exercise prescription and treatment plan were reviewed.  All starting workloads were established based on the results of the 6 minute walk test done at initial orientation visit.  The plan for exercise progression was also introduced and progression will be customized based on patient's performance and goals.          Exercise Goals and Review: Exercise Goals    Row Name 09/22/18 1227             Exercise Goals   Increase Physical Activity  Yes       Intervention  Provide advice, education, support and counseling about physical activity/exercise needs.;Develop an individualized exercise prescription for aerobic and resistive training based on initial evaluation findings, risk stratification, comorbidities and participant's personal goals.       Expected Outcomes  Short Term: Attend rehab on a regular basis to increase amount of physical activity.;Long Term: Add in home exercise to make exercise part of routine and to increase amount of physical activity.;Long Term:  Exercising regularly at least 3-5 days a week.       Increase Strength and Stamina  Yes       Intervention  Provide advice, education, support and counseling about physical activity/exercise needs.;Develop an individualized exercise prescription for aerobic and resistive training based on initial evaluation findings, risk stratification, comorbidities and participant's personal goals.       Expected Outcomes  Short Term: Increase workloads from initial exercise prescription for resistance, speed, and METs.;Short Term: Perform resistance training exercises routinely during rehab and add in resistance training at home;Long Term: Improve cardiorespiratory fitness, muscular endurance and strength as measured by increased METs and functional capacity (6MWT)       Able to understand and use rate of perceived exertion (RPE) scale  Yes       Intervention  Provide education and explanation on how to use RPE scale       Expected Outcomes  Short Term: Able to use RPE daily in rehab to express subjective intensity level;Long Term:  Able to use RPE to guide intensity level when exercising independently       Able to understand and use Dyspnea scale  Yes       Intervention  Provide education and explanation on how to use Dyspnea scale       Expected Outcomes  Short Term: Able to use Dyspnea scale daily in rehab to express subjective sense of shortness of breath during exertion;Long Term: Able to use Dyspnea scale  to guide intensity level when exercising independently       Knowledge and understanding of Target Heart Rate Range (THRR)  Yes       Intervention  Provide education and explanation of THRR including how the numbers were predicted and where they are located for reference       Expected Outcomes  Short Term: Able to state/look up THRR;Short Term: Able to use daily as guideline for intensity in rehab;Long Term: Able to use THRR to govern intensity when exercising independently       Able to check pulse  independently  Yes       Intervention  Provide education and demonstration on how to check pulse in carotid and radial arteries.;Review the importance of being able to check your own pulse for safety during independent exercise       Expected Outcomes  Short Term: Able to explain why pulse checking is important during independent exercise;Long Term: Able to check pulse independently and accurately       Understanding of Exercise Prescription  Yes       Intervention  Provide education, explanation, and written materials on patient's individual exercise prescription       Expected Outcomes  Short Term: Able to explain program exercise prescription;Long Term: Able to explain home exercise prescription to exercise independently          Exercise Goals Re-Evaluation : Exercise Goals Re-Evaluation    Row Name 09/24/18 0931 09/27/18 0933 10/05/18 1435 10/18/18 0921       Exercise Goal Re-Evaluation   Exercise Goals Review  Increase Physical Activity;Increase Strength and Stamina;Able to understand and use rate of perceived exertion (RPE) scale;Knowledge and understanding of Target Heart Rate Range (THRR);Able to understand and use Dyspnea scale;Understanding of Exercise Prescription  Increase Physical Activity;Increase Strength and Stamina;Able to understand and use rate of perceived exertion (RPE) scale;Knowledge and understanding of Target Heart Rate Range (THRR);Able to understand and use Dyspnea scale;Able to check pulse independently;Understanding of Exercise Prescription  Increase Physical Activity;Increase Strength and Stamina;Understanding of Exercise Prescription  Increase Physical Activity;Increase Strength and Stamina;Understanding of Exercise Prescription    Comments  Reviewed RPE scale, THR and program prescription with pt today.  Pt voiced understanding and was given a copy of goals to take home.  -  Rush Landmark is off to a good start in rehab.  He is already up to level 4 on the XR.  He is pleased  to be back in the program to build his strength for surgery. We will continue to monitor his progres.  Rush Landmark is doing well in rehab.  He finds the standing exercise to be harder than the seated.  It is getting easier somewhat.  He is slowly regaining his strength.  He is walking at home on is off days.    Expected Outcomes  Short: Use RPE daily to regulate intensity. Long: Follow program prescription in THR.  -  Short: Review home exericse guidelines. Long: Continue to follow program prescription.  Short: Continue to attend regulalry.  Long:Continue to build strength for surgery.       Discharge Exercise Prescription (Final Exercise Prescription Changes): Exercise Prescription Changes - 10/18/18 0900      Home Exercise Plan   Plans to continue exercise at  Home (comment)   walking   Frequency  Add 2 additional days to program exercise sessions.    Initial Home Exercises Provided  10/07/18       Nutrition:  Target Goals: Understanding of  nutrition guidelines, daily intake of sodium <1529m, cholesterol <2063m calories 30% from fat and 7% or less from saturated fats, daily to have 5 or more servings of fruits and vegetables.  Biometrics: Pre Biometrics - 09/22/18 1228      Pre Biometrics   Height  6' 1.25" (1.861 m)    Weight  216 lb 14.4 oz (98.4 kg)    BMI (Calculated)  28.41        Nutrition Therapy Plan and Nutrition Goals: Nutrition Therapy & Goals - 09/22/18 1133      Nutrition Therapy   Drug/Food Interactions  Coumadin/Vit K    Protein (specify units)  80g    Fiber  30 grams    Whole Grain Foods  3 servings    Saturated Fats  12 max. grams    Fruits and Vegetables  5 servings/day    Sodium  1.5 grams      Personal Nutrition Goals   Nutrition Goal  ST: include one plant based protein snack; to increase fiber and protein LT: lose ~10 lbs, get stronger for surgery    Comments  Pt reports eating 5 health bars for weight loss (12g pro, 100kcal each) with s lean protein and  vegetable for the day - discussed how vit/min from whole foods was optimal and that was insuffient kcals. Gave weight expectations and how his pro/kcal needs are higher due to his COPD. Pt reports lactose intolerance and only has dairy on occasion. Pt reports baking/grilling fish or chicken. Pt reports eating limited "junk foods" and even processed foods. In am will have coffee, sausage, eggs, and sometimes grits. Pt will not usually have lunch "not eating doesn't bother me", but will sometimes have a protein shake which is high in prootein but fairly low in calories (told pt that could make a good snack. Pt reports that he needs medication and the protein shake to stay regular, discussed fiber needs. Discussed HH eating. Pt reports that lasix is prescribed but has not been used (was prescribed over telehealth just in case).      Intervention Plan   Intervention  Prescribe, educate and counsel regarding individualized specific dietary modifications aiming towards targeted core components such as weight, hypertension, lipid management, diabetes, heart failure and other comorbidities.;Nutrition handout(s) given to patient.    Expected Outcomes  Short Term Goal: Understand basic principles of dietary content, such as calories, fat, sodium, cholesterol and nutrients.;Short Term Goal: A plan has been developed with personal nutrition goals set during dietitian appointment.;Long Term Goal: Adherence to prescribed nutrition plan.       Nutrition Assessments: Nutrition Assessments - 09/22/18 1144      MEDFICTS Scores   Pre Score  26       Nutrition Goals Re-Evaluation:   Nutrition Goals Discharge (Final Nutrition Goals Re-Evaluation):   Psychosocial: Target Goals: Acknowledge presence or absence of significant depression and/or stress, maximize coping skills, provide positive support system. Participant is able to verbalize types and ability to use techniques and skills needed for reducing stress and  depression.   Initial Review & Psychosocial Screening: Initial Psych Review & Screening - 09/21/18 0814      Initial Review   Current issues with  History of Depression;Current Sleep Concerns   He sleeps about 5-6 hours a night, uses CBD oil to help, gets up two to three times to use bathroom and is often able to get back to sleep     Family Dynamics   Good Support System?  Yes   Wife [name]), daughter  (Amy), son Corene Cornea), 3 grandson, and good relationships wiht in-law   Comments  Attends a great church but has not been since [place], will wait til after surgery      Barriers   Psychosocial barriers to participate in program  There are no identifiable barriers or psychosocial needs.;The patient should benefit from training in stress management and relaxation.      Screening Interventions   Interventions  Encouraged to exercise;To provide support and resources with identified psychosocial needs    Expected Outcomes  Long Term Goal: Stressors or current issues are controlled or eliminated.;Short Term goal: Utilizing psychosocial counselor, staff and physician to assist with identification of specific Stressors or current issues interfering with healing process. Setting desired goal for each stressor or current issue identified.;Short Term goal: Identification and review with participant of any Quality of Life or Depression concerns found by scoring the questionnaire.;Long Term goal: The participant improves quality of Life and PHQ9 Scores as seen by post scores and/or verbalization of changes       Quality of Life Scores:  Scores of 19 and below usually indicate a poorer quality of life in these areas.  A difference of  2-3 points is a clinically meaningful difference.  A difference of 2-3 points in the total score of the Quality of Life Index has been associated with significant improvement in overall quality of life, self-image, physical symptoms, and general health in studies assessing change  in quality of life.  PHQ-9: Recent Review Flowsheet Data    Depression screen [facility] 2/9 10/18/2018 09/22/2018 09/10/2018 07/14/2018 01/04/2018   Decreased Interest 0 1 0 0 0   Down, Depressed, Hopeless 0 1 0 0 0   PHQ - 2 Score 0 2 0 0 0   Altered sleeping 1 2 - 0 -   Tired, decreased energy 2 2 - 0 -   Change in appetite 2 0 - 0 -   Feeling bad or failure about yourself  0 0 - 0 -   Trouble concentrating 2 1 - 0 -   Moving slowly or fidgety/restless 1 0 - 0 -   Suicidal thoughts 0 0 - 0 -   PHQ-9 Score 8 7 - 0 -   Difficult doing work/chores Not difficult at all Not difficult at all - Not difficult at all -     Interpretation of Total Score  Total Score Depression Severity:  1-4 = Minimal depression, 5-9 = Mild depression, 10-14 = Moderate depression, 15-19 = Moderately severe depression, 20-27 = Severe depression   Psychosocial Evaluation and Intervention:   Psychosocial Re-Evaluation: Psychosocial Re-Evaluation    Grady Name 10/18/18 0923             Psychosocial Re-Evaluation   Current issues with  History of Depression;Current Sleep Concerns;Current Stress Concerns       Comments  Rush Landmark is doing well in rehab.  He is preparing for a AAA repair surgery and his doctor wanted him to build up his strength first.  He has not had any depression symptoms.  He is sleeping better now and not taking anything for it.  His PHQ score went up slightly, but more related to energy and sleep.  He is feeling better overall.       Expected Outcomes  Short: Continue to improve Long: Continue to feel better and stay positive.          Psychosocial Discharge (Final Psychosocial Re-Evaluation): Psychosocial Re-Evaluation -  10/18/18 6010      Psychosocial Re-Evaluation   Current issues with  History of Depression;Current Sleep Concerns;Current Stress Concerns    Comments  Rush Landmark is doing well in rehab.  He is preparing for a AAA repair surgery and his doctor wanted him to build up his strength first.   He has not had any depression symptoms.  He is sleeping better now and not taking anything for it.  His PHQ score went up slightly, but more related to energy and sleep.  He is feeling better overall.    Expected Outcomes  Short: Continue to improve Long: Continue to feel better and stay positive.       Education: Education Goals: Education classes will be provided on a weekly basis, covering required topics. Participant will state understanding/return demonstration of topics presented.  Learning Barriers/Preferences: Learning Barriers/Preferences - 09/21/18 0813      Learning Barriers/Preferences   Learning Barriers  Sight   glasses   Learning Preferences  None       Education Topics:  Initial Evaluation Education: - Verbal, written and demonstration of respiratory meds, oximetry and breathing techniques. Instruction on use of nebulizers and MDIs and importance of monitoring MDI activations.   Pulmonary Rehab from 11/24/2016 in [facility] Cardiac and Pulmonary Rehab  Date  08/12/16  Educator  SB  Instruction Review Code (retired)  2- meets goals/outcomes      General Nutrition Guidelines/Fats and Fiber: -Group instruction provided by verbal, written material, models and posters to present the general guidelines for heart healthy nutrition. Gives an explanation and review of dietary fats and fiber.   Pulmonary Rehab from 11/24/2016 in [facility] Cardiac and Pulmonary Rehab  Date  11/17/16  Educator  CR  Instruction Review Code  1- Verbalizes Understanding      Controlling Sodium/Reading Food Labels: -Group verbal and written material supporting the discussion of sodium use in heart healthy nutrition. Review and explanation with models, verbal and written materials for utilization of the food label.   Pulmonary Rehab from 11/24/2016 in [facility] Cardiac and Pulmonary Rehab  Date  11/24/16  Educator  CR  Instruction Review Code  5- Refused Teaching      Exercise Physiology & General  Exercise Guidelines: - Group verbal and written instruction with models to review the exercise physiology of the cardiovascular system and associated critical values. Provides general exercise guidelines with specific guidelines to those with heart or lung disease.    Pulmonary Rehab from 11/24/2016 in [facility] Cardiac and Pulmonary Rehab  Date  11/07/16  Educator  Nada Maclachlan, EP  Instruction Review Code  2- Demonstrated Understanding      Aerobic Exercise & Resistance Training: - Gives group verbal and written instruction on the various components of exercise. Focuses on aerobic and resistive training programs and the benefits of this training and how to safely progress through these programs.   Pulmonary Rehab from 11/24/2016 in [facility] Cardiac and Pulmonary Rehab  Date  08/29/16  Educator  [facility]  Instruction Review Code  2- Demonstrated Understanding      Flexibility, Balance, Mind/Body Relaxation: Provides group verbal/written instruction on the benefits of flexibility and balance training, including mind/body exercise modes such as yoga, pilates and tai chi.  Demonstration and skill practice provided.   Pulmonary Rehab from 11/24/2016 in [facility] Cardiac and Pulmonary Rehab  Date  09/17/16  Educator  [facility]  Instruction Review Code  2- Demonstrated Understanding      Stress and Anxiety: - Provides group verbal and  written instruction about the health risks of elevated stress and causes of high stress.  Discuss the correlation between heart/lung disease and anxiety and treatment options. Review healthy ways to manage with stress and anxiety.   Pulmonary Rehab from 11/24/2016 in [facility] Cardiac and Pulmonary Rehab  Date  10/22/16  Educator  [facility]  Instruction Review Code  1- Verbalizes Understanding      Depression: - Provides group verbal and written instruction on the correlation between heart/lung disease and depressed mood, treatment options, and the stigmas associated with seeking  treatment.   Pulmonary Rehab from 11/24/2016 in [facility] Cardiac and Pulmonary Rehab  Date  09/24/16  Educator  [facility]  Instruction Review Code (retired)  2- meets goals/outcomes      Exercise & Equipment Safety: - Individual verbal instruction and demonstration of equipment use and safety with use of the equipment.   Cardiac Rehab from 09/22/2018 in [facility] Cardiac and Pulmonary Rehab  Date  09/22/18  Educator  AS  Instruction Review Code  1- Verbalizes Understanding      Infection Prevention: - Provides verbal and written material to individual with discussion of infection control including proper hand washing and proper equipment cleaning during exercise session.   Cardiac Rehab from 09/22/2018 in [facility] Cardiac and Pulmonary Rehab  Date  09/22/18  Educator  AS  Instruction Review Code  1- Verbalizes Understanding      Falls Prevention: - Provides verbal and written material to individual with discussion of falls prevention and safety.   Cardiac Rehab from 09/22/2018 in [facility] Cardiac and Pulmonary Rehab  Date  09/22/18  Educator  AS  Instruction Review Code  1- Verbalizes Understanding      Diabetes: - Individual verbal and written instruction to review signs/symptoms of diabetes, desired ranges of glucose level fasting, after meals and with exercise. Advice that pre and post exercise glucose checks will be done for 3 sessions at entry of program.   Chronic Lung Diseases: - Group verbal and written instruction to review updates, respiratory medications, advancements in procedures and treatments. Discuss use of supplemental oxygen including available portable oxygen systems, continuous and intermittent flow rates, concentrators, personal use and safety guidelines. Review proper use of inhaler and spacers. Provide informative websites for self-education.    Pulmonary Rehab from 11/24/2016 in [facility] Cardiac and Pulmonary Rehab  Date  11/05/16  Educator  [facility]  Instruction Review Code  1-  Verbalizes Understanding      Energy Conservation: - Provide group verbal and written instruction for methods to conserve energy, plan and organize activities. Instruct on pacing techniques, use of adaptive equipment and posture/positioning to relieve shortness of breath.   Pulmonary Rehab from 11/24/2016 in [facility] Cardiac and Pulmonary Rehab  Date  10/15/16  Educator  [facility]  Instruction Review Code  1- Verbalizes Understanding      Triggers and Exacerbations: - Group verbal and written instruction to review types of environmental triggers and ways to prevent exacerbations. Discuss weather changes, air quality and the benefits of nasal washing. Review warning signs and symptoms to help prevent infections. Discuss techniques for effective airway clearance, coughing, and vibrations.   AED/CPR: - Group verbal and written instruction with the use of models to demonstrate the basic use of the AED with the basic ABC's of resuscitation.   Anatomy and Physiology of the Lungs: - Group verbal and written instruction with the use of models to provide basic lung anatomy and physiology related to function, structure and complications of lung disease.  Pulmonary Rehab from 11/24/2016 in [facility] Cardiac and Pulmonary Rehab  Date  10/08/16  Educator  [facility]  Instruction Review Code  1- Verbalizes Understanding      Anatomy & Physiology of the Heart: - Group verbal and written instruction and models provide basic cardiac anatomy and physiology, with the coronary electrical and arterial systems. Review of Valvular disease and Heart Failure   Cardiac Medications: - Group verbal and written instruction to review commonly prescribed medications for heart disease. Reviews the medication, class of the drug, and side effects.   Pulmonary Rehab from 11/24/2016 in [facility] Cardiac and Pulmonary Rehab  Date  10/10/16  Educator  C. EnterkinRN  Instruction Review Code  1- Verbalizes Understanding      Know Your  Numbers and Risk Factors: -Group verbal and written instruction about important numbers in your health.  Discussion of what are risk factors and how they play a role in the disease process.  Review of Cholesterol, Blood Pressure, Diabetes, and BMI and the role they play in your overall health.   Sleep Hygiene: -Provides group verbal and written instruction about how sleep can affect your health.  Define sleep hygiene, discuss sleep cycles and impact of sleep habits. Review good sleep hygiene tips.    Other: -Provides group and verbal instruction on various topics (see comments)    Knowledge Questionnaire Score: Knowledge Questionnaire Score - 09/22/18 1235      Knowledge Questionnaire Score   Pre Score  15/18        Core Components/Risk Factors/Patient Goals at Admission: Personal Goals and Risk Factors at Admission - 09/22/18 1235      Core Components/Risk Factors/Patient Goals on Admission    Weight Management  Weight Loss;Yes    Intervention  Weight Management: Develop a combined nutrition and exercise program designed to reach desired caloric intake, while maintaining appropriate intake of nutrient and fiber, sodium and fats, and appropriate energy expenditure required for the weight goal.;Weight Management: Provide education and appropriate resources to help participant work on and attain dietary goals.    Admit Weight  216 lb 14.4 oz (98.4 kg)    Goal Weight: Short Term  219 lb (99.3 kg)    Goal Weight: Long Term  209 lb (94.8 kg)    Expected Outcomes  Short Term: Continue to assess and modify interventions until short term weight is achieved;Long Term: Adherence to nutrition and physical activity/exercise program aimed toward attainment of established weight goal;Weight Loss: Understanding of general recommendations for a balanced deficit meal plan, which promotes 1-2 lb weight loss per week and includes a negative energy balance of [phone] kcal/d;Understanding recommendations  for meals to include 15-35% energy as protein, 25-35% energy from fat, 35-60% energy from carbohydrates, less than 226m of dietary cholesterol, 20-35 gm of total fiber daily;Understanding of distribution of calorie intake throughout the day with the consumption of 4-5 meals/snacks    Improve shortness of breath with ADL's  Yes    Intervention  Provide education, individualized exercise plan and daily activity instruction to help decrease symptoms of SOB with activities of daily living.    Expected Outcomes  Short Term: Improve cardiorespiratory fitness to achieve a reduction of symptoms when performing ADLs;Long Term: Be able to perform more ADLs without symptoms or delay the onset of symptoms    Intervention  Provide education on lifestyle modifcations including regular physical activity/exercise, weight management, moderate sodium restriction and increased consumption of fresh fruit, vegetables, and low fat dairy, alcohol moderation, and smoking cessation.;Monitor  prescription use compliance.    Expected Outcomes  Short Term: Continued assessment and intervention until BP is < 140/64m HG in hypertensive participants. < 130/886mHG in hypertensive participants with diabetes, heart failure or chronic kidney disease.;Long Term: Maintenance of blood pressure at goal levels.    Intervention  Provide education and support for participant on nutrition & aerobic/resistive exercise along with prescribed medications to achieve LDL <7086mHDL >44m58m  Expected Outcomes  Short Term: Participant states understanding of desired cholesterol values and is compliant with medications prescribed. Participant is following exercise prescription and nutrition guidelines.;Long Term: Cholesterol controlled with medications as prescribed, with individualized exercise RX and with personalized nutrition plan. Value goals: LDL < 70mg51mL > 40 mg.       Core Components/Risk Factors/Patient Goals Review:  Goals and Risk Factor  Review    Row Name 10/18/18 0924             Core Components/Risk Factors/Patient Goals Review   Personal Goals Review  Weight Management/Obesity;Improve shortness of breath with ADL's;Hypertension;Lipids       Review  Bill's weight has plateaued.  He has had house guest and entertaining, now that they are gone, he is hoping to get back on track with his weight loss.  He feels that his breathing is improving.  He checks his pressures daily at home and they are doing well.  He is doing well on his medications.       Expected Outcomes  Short: Get back to eating better for weight loss.  Long: Continue to improve breathing.          Core Components/Risk Factors/Patient Goals at Discharge (Final Review):  Goals and Risk Factor Review - 10/18/18 0924      Core Components/Risk Factors/Patient Goals Review   Personal Goals Review  Weight Management/Obesity;Improve shortness of breath with ADL's;Hypertension;Lipids    Review  Bill's weight has plateaued.  He has had house guest and entertaining, now that they are gone, he is hoping to get back on track with his weight loss.  He feels that his breathing is improving.  He checks his pressures daily at home and they are doing well.  He is doing well on his medications.    Expected Outcomes  Short: Get back to eating better for weight loss.  Long: Continue to improve breathing.       ITP Comments: ITP Comments    Row Name 09/21/18 0824 09/21/18 0832 09/22/18 1218 10/13/18 0721 11/01/18 0943   ITP Comments  [facility] looking [facility] sodium versus statins.  Not sure what he is taking, but numbers seem to be good.  Virtual orientation visit completed.  [facility] for [facility].  Completed 6MWT and nutrition eval Initial ITP created and sent to Dr MilleSabra Heck Day Review Completed today. Continue with ITP unless changed by Medical Director review.  Bill did report extra shortness of breath this  morning while getting ready for the day. His weight was the same and vital signs were within acceptable ranges. Exercise was tolerated but with a little extra shortness of breath. Bill Rush Landmarkadvised to call his doctor and report this change in symptoms.   Row NWeatherby 11/10/18 1107           ITP Comments  30 day review completed. ITP sent to Dr. Mark Emily Filbertical Director of Cardiac and Pulmonary Rehab. Continue with ITP unless changes are made by  physician.  Department closed starting 10/2 until further notice by infection prevention and Health at Work teams for [place].          Comments: 30 day review

## 2018-11-10 NOTE — Assessment & Plan Note (Addendum)
Patient with positive COVID-19 testing.  Symptoms are consistent with this.  I discussed supportive care with him.  I discussed strict quarantine precautions for him and his wife.  I discussed that he should quarantine until it has been at least 10 days since the onset of his symptoms and he has had at least 3 days of no fever without the use of Tylenol or ibuprofen and has had at least 3 days of improved symptoms.  I discussed that his wife needs to quarantine from now until 14 days after the patient's symptoms improve to ensure that she does not develop any symptoms.  I advised that if she develops symptoms they need to contact us so we can get her evaluated.  He can continue Mucinex.  He does report some mild wheezing and I discussed he could use his albuterol inhaler for this.  He was advised to seek medical attention if he develops increasing trouble breathing, excessively high fevers, or if he starts to feel increasingly ill.  Health department form sent through my chart.  My chart monitoring set up as well.  We will confirm that this positive result has been reported to the health department.  The patient will contact his cardiac surgeon at [facility] and let them know of the positive test.  He is scheduled to have a surgery at the end of this month.

## 2018-11-15 ENCOUNTER — Ambulatory Visit (INDEPENDENT_AMBULATORY_CARE_PROVIDER_SITE_OTHER): Payer: Medicare Other | Admitting: Cardiology

## 2018-11-15 DIAGNOSIS — I359 Nonrheumatic aortic valve disorder, unspecified: Secondary | ICD-10-CM

## 2018-11-15 DIAGNOSIS — Z7901 Long term (current) use of anticoagulants: Secondary | ICD-10-CM | POA: Diagnosis not present

## 2018-11-15 LAB — POCT INR: INR: 2.7 (ref 2.0–3.0)

## 2018-11-17 ENCOUNTER — Telehealth: Payer: Self-pay

## 2018-11-17 NOTE — Telephone Encounter (Signed)
Patient to repeat INR on 10/19 as previously scheduled, then anticoagulation to be managed by [facility] while in hospital for surgery.

## 2018-11-17 NOTE — Telephone Encounter (Signed)
Pt called and stated that the Surgeon doesn't want lovenox instead he will dc warfarin 10/23, 10/28 start heparin, & 10/29 will have the surgery.pt stated won't be doing another inr until after surgery not sure if he has to come prior to surgery to get an inr check

## 2018-11-19 ENCOUNTER — Telehealth: Payer: Self-pay | Admitting: *Deleted

## 2018-11-19 NOTE — Telephone Encounter (Signed)
Patient called to state that his surgeons do not want him to come to pulmonary rehab until after his surgeries for his aneurysms (the end of October and beginning of November). We will place him on hold until he is medically cleared

## 2018-11-22 ENCOUNTER — Ambulatory Visit: Payer: Self-pay | Admitting: Pharmacist Clinician (PhC)/ Clinical Pharmacy Specialist

## 2018-11-22 DIAGNOSIS — Z7901 Long term (current) use of anticoagulants: Secondary | ICD-10-CM

## 2018-11-22 DIAGNOSIS — I359 Nonrheumatic aortic valve disorder, unspecified: Secondary | ICD-10-CM

## 2018-11-22 LAB — POCT INR: INR: 2.2 (ref 2.0–3.0)

## 2018-12-01 DIAGNOSIS — U071 COVID-19: Secondary | ICD-10-CM | POA: Insufficient documentation

## 2018-12-02 HISTORY — PX: REPAIR OF ACUTE ASCENDING THORACIC AORTIC DISSECTION: SHX6323

## 2018-12-03 DIAGNOSIS — Z9889 Other specified postprocedural states: Secondary | ICD-10-CM | POA: Insufficient documentation

## 2018-12-08 ENCOUNTER — Other Ambulatory Visit: Payer: Self-pay | Admitting: Family Medicine

## 2018-12-08 ENCOUNTER — Encounter: Payer: Self-pay | Admitting: *Deleted

## 2018-12-08 DIAGNOSIS — J449 Chronic obstructive pulmonary disease, unspecified: Secondary | ICD-10-CM

## 2018-12-08 DIAGNOSIS — R0600 Dyspnea, unspecified: Secondary | ICD-10-CM

## 2018-12-08 NOTE — Progress Notes (Signed)
Pulmonary Individual Treatment Plan  Patient Details  Name: Alexander Barton MRN: [id number] Date of Birth: 1950-07-29 Referring Provider:     Cardiac Rehab from 09/22/2018 in [facility]  Referring Provider  Sonnenberg      Initial Encounter Date:    Cardiac Rehab from 09/22/2018 in [facility]  Date  09/22/18      Visit Diagnosis: Chronic obstructive pulmonary disease, unspecified COPD type ([place])  Dyspnea, unspecified type  Patient's Home Medications on Admission:  Current Outpatient Medications:  .  albuterol (VENTOLIN HFA) 108 (90 Base) MCG/ACT inhaler, Inhale 2 puffs into the lungs every 4 (four) hours as needed for wheezing or shortness of breath., Disp: 18 g, Rfl: 1 .  amLODipine (NORVASC) 5 MG tablet, TAKE 1 TABLET BY MOUTH EVERY DAY, Disp: 90 tablet, Rfl: 1 .  aspirin 81 MG tablet, Take 81 mg by mouth at bedtime. , Disp: , Rfl:  .  benzonatate (TESSALON) 100 MG capsule, Take 1 or 2 capsules every 8 hours as needed for cough, Disp: 30 capsule, Rfl: 1 .  budesonide-formoterol (SYMBICORT) 160-4.5 MCG/ACT inhaler, TAKE 2 PUFFS BY MOUTH TWICE A DAY, Disp: 30.6 Inhaler, Rfl: 4 .  enoxaparin (LOVENOX) 100 MG/ML injection, Inject 1 mL (100 mg total) into the skin every 12 (twelve) hours. As instructed by coumadin clinic, Disp: 20 mL, Rfl: 0 .  furosemide (LASIX) 20 MG tablet, TAKE 1/2 TABLET (10 MG) 1 OR 2 TIMES A DAY AS NEEDED FOR LEG SWELLING, Disp: 90 tablet, Rfl: 1 .  metoprolol succinate (TOPROL-XL) 50 MG 24 hr tablet, Take 1 tablet (50 mg total) by mouth daily. Take with or immediately following a meal., Disp: 90 tablet, Rfl: 1 .  NON FORMULARY, , Disp: , Rfl:  .  Polyethylene Glycol 3350 (MIRALAX PO), Take by mouth., Disp: , Rfl:  .  senna-docusate (SENOKOT-S) 8.6-50 MG tablet, Take by mouth., Disp: , Rfl:  .  tamsulosin (FLOMAX) 0.4 MG CAPS capsule, TAKE 2 CAPSULES BY MOUTH EVERY DAY, Disp: 174 capsule, Rfl: 1 .  Tiotropium  Bromide Monohydrate (SPIRIVA RESPIMAT) 2.5 MCG/ACT AERS, INHALE 2.5 MCG INTO THE LUNGS 2 (TWO) TIMES DAILY., Disp: 1 Inhaler, Rfl: 5 .  warfarin (COUMADIN) 2 MG tablet, TAKE 1 TABLET WITH 5 MG TABLET DAILY AS DIRECTED BY COUMADIN CLINIC, Disp: 90 tablet, Rfl: 0 .  warfarin (COUMADIN) 5 MG tablet, TAKE 1 TABLET DAILY AS DIRECTED WITH 2 MG TABLET, Disp: 90 tablet, Rfl: 1  Past Medical History: Past Medical History:  Diagnosis Date  . Aortic aneurysm, thoracic ([place])   . Aortic valve disease   . Arthritis   . BPH (benign prostatic hyperplasia)   . COPD (chronic obstructive pulmonary disease) ([place])   . GERD (gastroesophageal reflux disease)   . HTN (hypertension)   . Hypercholesterolemia   . Stroke ([place])   . TIA (transient ischemic attack)     Tobacco Use: Social History   Tobacco Use  Smoking Status Never Smoker  Smokeless Tobacco Never Used    Labs: Recent Review Flowsheet Data    Labs for ITP Cardiac and Pulmonary Rehab Latest Ref Rng & Units 06/04/2012 11/14/2014 10/03/2015 12/10/2016 12/22/2016   Cholestrol 0 - 200 mg/dL 247(H) 254(H) 174 253(H) -   LDLCALC 0 - 99 mg/dL 169(H) - 87 185(H) -   LDLDIRECT mg/dL - 172.0 - - -   HDL >39.00 mg/dL 45 48.50 47.20 45.00 -   Trlycerides 0.0 - 149.0 mg/dL 165(H) 202.0(H) 195.0(H)  112.0 -   Hemoglobin A1c 4.6 - 6.5 % 5.9(H) - 6.2 - 6.1       Pulmonary Assessment Scores: Pulmonary Assessment Scores    Row Name 09/22/18 1231         ADL UCSD   SOB Score total  62     Rest  1     Walk  2     Stairs  4     Bath  4     Dress  2     Shop  2       CAT Score   CAT Score  14       mMRC Score   mMRC Score  1        UCSD: Self-administered rating of dyspnea associated with activities of daily living (ADLs) 6-point scale (0 = "not at all" to 5 = "maximal or unable to do because of breathlessness")  Scoring Scores range from 0 to 120.  Minimally important difference is 5 units  CAT: CAT can identify the health impairment of  COPD patients and is better correlated with disease progression.  CAT has a scoring range of zero to 40. The CAT score is classified into four groups of low (less than 10), medium (10 - 20), high (21-30) and very high (31-40) based on the impact level of disease on health status. A CAT score over 10 suggests significant symptoms.  A worsening CAT score could be explained by an exacerbation, poor medication adherence, poor inhaler technique, or progression of COPD or comorbid conditions.  CAT MCID is 2 points  mMRC: mMRC (Modified Medical Research Council) Dyspnea Scale is used to assess the degree of baseline functional disability in patients of respiratory disease due to dyspnea. No minimal important difference is established. A decrease in score of 1 point or greater is considered a positive change.   Pulmonary Function Assessment:   Exercise Target Goals: Exercise Program Goal: Individual exercise prescription set using results from initial 6 min walk test and THRR while considering  patient's activity barriers and safety.   Exercise Prescription Goal: Initial exercise prescription builds to 30-45 minutes a day of aerobic activity, 2-3 days per week.  Home exercise guidelines will be given to patient during program as part of exercise prescription that the participant will acknowledge.  Activity Barriers & Risk Stratification: Activity Barriers & Cardiac Risk Stratification - 09/21/18 0810      Activity Barriers & Cardiac Risk Stratification   Activity Barriers  Deconditioning;Muscular Weakness;Joint Problems;Other (comment)    Comments  Hx CVA,  r knee pain       6 Minute Walk: 6 Minute Walk    Row Name 09/22/18 1220         6 Minute Walk   Phase  Initial     Distance  1765 feet     Walk Time  6 minutes     # of Rest Breaks  0     MPH  3.34     METS  4.06     RPE  17     Perceived Dyspnea   3.5     VO2 Peak  14.21     Symptoms  No     Resting HR  78 bpm     Resting BP   102/58     Resting Oxygen Saturation   94 %     Exercise Oxygen Saturation  during 6 min walk  93 %     Max Ex.  HR  121 bpm     Max Ex. BP  138/58     2 Minute Post BP  122/58       Interval HR   1 Minute HR  105     2 Minute HR  113     3 Minute HR  116     4 Minute HR  112     5 Minute HR  110     6 Minute HR  121     2 Minute Post HR  97     Interval Heart Rate?  Yes       Interval Oxygen   Interval Oxygen?  Yes     Baseline Oxygen Saturation %  94 %     1 Minute Oxygen Saturation %  93 %     1 Minute Liters of Oxygen  0 L     2 Minute Oxygen Saturation %  94 %     2 Minute Liters of Oxygen  0 L     3 Minute Oxygen Saturation %  94 %     3 Minute Liters of Oxygen  0 L     4 Minute Oxygen Saturation %  94 %     4 Minute Liters of Oxygen  0 L     5 Minute Oxygen Saturation %  95 %     5 Minute Liters of Oxygen  0 L     6 Minute Oxygen Saturation %  95 %     6 Minute Liters of Oxygen  0 L     2 Minute Post Oxygen Saturation %  96 %     2 Minute Post Liters of Oxygen  0 L       Oxygen Initial Assessment: Oxygen Initial Assessment - 09/21/18 0810      Home Oxygen   Home Oxygen Device  None    Sleep Oxygen Prescription  None    Home Exercise Oxygen Prescription  None    Home at Rest Exercise Oxygen Prescription  None      Initial 6 min Walk   Oxygen Used  None      Program Oxygen Prescription   Program Oxygen Prescription  None      Intervention   Short Term Goals  To learn and demonstrate proper pursed lip breathing techniques or other breathing techniques.;To learn and understand importance of monitoring SPO2 with pulse oximeter and demonstrate accurate use of the pulse oximeter.;To learn and understand importance of maintaining oxygen saturations>88%;To learn and demonstrate proper use of respiratory medications    Long  Term Goals  Verbalizes importance of monitoring SPO2 with pulse oximeter and return demonstration;Exhibits proper breathing techniques, such as  pursed lip breathing or other method taught during program session;Demonstrates proper use of MDI's;Compliance with respiratory medication;Maintenance of O2 saturations>88%       Oxygen Re-Evaluation: Oxygen Re-Evaluation    Row Name 09/24/18 0932 10/18/18 0926           Program Oxygen Prescription   Program Oxygen Prescription  -  None        Home Oxygen   Home Oxygen Device  -  None      Sleep Oxygen Prescription  -  None      Home Exercise Oxygen Prescription  -  None      Home at Rest Exercise Oxygen Prescription  -  None        Goals/Expected Outcomes   Short Term  Goals  To learn and understand importance of monitoring SPO2 with pulse oximeter and demonstrate accurate use of the pulse oximeter.;To learn and understand importance of maintaining oxygen saturations>88%;To learn and demonstrate proper pursed lip breathing techniques or other breathing techniques.  To learn and understand importance of monitoring SPO2 with pulse oximeter and demonstrate accurate use of the pulse oximeter.;To learn and understand importance of maintaining oxygen saturations>88%;To learn and demonstrate proper pursed lip breathing techniques or other breathing techniques.;To learn and demonstrate proper use of respiratory medications      Long  Term Goals  Verbalizes importance of monitoring SPO2 with pulse oximeter and return demonstration;Maintenance of O2 saturations>88%;Exhibits proper breathing techniques, such as pursed lip breathing or other method taught during program session  Verbalizes importance of monitoring SPO2 with pulse oximeter and return demonstration;Maintenance of O2 saturations>88%;Exhibits proper breathing techniques, such as pursed lip breathing or other method taught during program session;Compliance with respiratory medication;Demonstrates proper use of MDI's      Comments  Reviewed PLB technique with pt.  Talked about how it work and it's important to maintaining his exercise  saturations.  He has really appreciated that difference that PLB has made for him.  Since using it more consistently, he has not needed to use his rescue inhaler.      Goals/Expected Outcomes  Short: Become more profiecient at using PLB.   Long: Become independent at using PLB.  Short: Continue to use PLB regularly.  Long: Continue to improve breathing.         Oxygen Discharge (Final Oxygen Re-Evaluation): Oxygen Re-Evaluation - 10/18/18 0926      Program Oxygen Prescription   Program Oxygen Prescription  None      Home Oxygen   Home Oxygen Device  None    Sleep Oxygen Prescription  None    Home Exercise Oxygen Prescription  None    Home at Rest Exercise Oxygen Prescription  None      Goals/Expected Outcomes   Short Term Goals  To learn and understand importance of monitoring SPO2 with pulse oximeter and demonstrate accurate use of the pulse oximeter.;To learn and understand importance of maintaining oxygen saturations>88%;To learn and demonstrate proper pursed lip breathing techniques or other breathing techniques.;To learn and demonstrate proper use of respiratory medications    Long  Term Goals  Verbalizes importance of monitoring SPO2 with pulse oximeter and return demonstration;Maintenance of O2 saturations>88%;Exhibits proper breathing techniques, such as pursed lip breathing or other method taught during program session;Compliance with respiratory medication;Demonstrates proper use of MDI's    Comments  He has really appreciated that difference that PLB has made for him.  Since using it more consistently, he has not needed to use his rescue inhaler.    Goals/Expected Outcomes  Short: Continue to use PLB regularly.  Long: Continue to improve breathing.       Initial Exercise Prescription: Initial Exercise Prescription - 09/22/18 1200      Date of Initial Exercise RX and Referring Provider   Date  09/22/18    Referring Provider  Sonnenberg      Treadmill   MPH  3.3    Grade   1    Minutes  15    METs  4      Elliptical   Level  1    Speed  3    Minutes  15      REL-XR   Level  3    Speed  50    Minutes  15    METs  4      T5 Nustep   Level  3    SPM  80    Minutes  15    METs  4      Prescription Details   Frequency (times per week)  3    Duration  Progress to 30 minutes of continuous aerobic without signs/symptoms of physical distress      Intensity   THRR 40-80% of Max Heartrate  107-136    Ratings of Perceived Exertion  11-15    Perceived Dyspnea  0-4      Progression   Progression  Continue to progress workloads to maintain intensity without signs/symptoms of physical distress.      Resistance Training   Training Prescription  Yes    Weight  5 lb    Reps  10-15       Perform Capillary Blood Glucose checks as needed.  Exercise Prescription Changes: Exercise Prescription Changes    Row Name 09/22/18 1200 10/05/18 1400 10/18/18 0900 11/16/18 1400       Response to Exercise   Blood Pressure (Admit)  102/58  110/70  -  128/60    Blood Pressure (Exercise)  138/58  146/80  -  140/60    Blood Pressure (Exit)  122/58  126/64  -  124/72    Heart Rate (Admit)  78 bpm  80 bpm  -  77 bpm    Heart Rate (Exercise)  121 bpm  97 bpm  -  104 bpm    Heart Rate (Exit)  97 bpm  84 bpm  -  98 bpm    Oxygen Saturation (Admit)  94 %  95 %  -  97 %    Oxygen Saturation (Exercise)  93 %  94 %  -  95 %    Oxygen Saturation (Exit)  96 %  96 %  -  95 %    Rating of Perceived Exertion (Exercise)  17  12  -  13    Perceived Dyspnea (Exercise)  3.5  3  -  3    Symptoms  none  none  -  none    Duration  -  Continue with 30 min of aerobic exercise without signs/symptoms of physical distress.  -  Continue with 30 min of aerobic exercise without signs/symptoms of physical distress.    Intensity  -  THRR unchanged  -  THRR unchanged      Progression   Progression  -  Continue to progress workloads to maintain intensity without signs/symptoms of physical  distress.  -  Continue to progress workloads to maintain intensity without signs/symptoms of physical distress.    Average METs  -  2.91  -  3.42      Resistance Training   Training Prescription  -  Yes  -  Yes    Weight  -  5 lbs  -  5 lbs    Reps  -  10-15  -  10-15      Interval Training   Interval Training  -  No  -  No      Treadmill   MPH  -  2.8  -  3.2    Grade  -  1  -  0    Minutes  -  15  -  15    METs  -  3.53  -  3.45      Elliptical  Level  -  1  -  6    Speed  -  2.5  -  3.5    Minutes  -  15  -  15      REL-XR   Level  -  4  -  9    Minutes  -  15  -  15    METs  -  3.1  -  4.3      T5 Nustep   Level  -  3  -  8    Minutes  -  15  -  15    METs  -  3.1  -  2.5      Home Exercise Plan   Plans to continue exercise at  -  -  Home (comment) walking  Home (comment) walking    Frequency  -  -  Add 2 additional days to program exercise sessions.  Add 2 additional days to program exercise sessions.    Initial Home Exercises Provided  -  -  10/07/18  10/07/18       Exercise Comments: Exercise Comments    Row Name 09/24/18 0931           Exercise Comments  First full day of exercise!  Patient was oriented to gym and equipment including functions, settings, policies, and procedures.  Patient's individual exercise prescription and treatment plan were reviewed.  All starting workloads were established based on the results of the 6 minute walk test done at initial orientation visit.  The plan for exercise progression was also introduced and progression will be customized based on patient's performance and goals.          Exercise Goals and Review: Exercise Goals    Row Name 09/22/18 1227             Exercise Goals   Increase Physical Activity  Yes       Intervention  Provide advice, education, support and counseling about physical activity/exercise needs.;Develop an individualized exercise prescription for aerobic and resistive training based on initial  evaluation findings, risk stratification, comorbidities and participant's personal goals.       Expected Outcomes  Short Term: Attend rehab on a regular basis to increase amount of physical activity.;Long Term: Add in home exercise to make exercise part of routine and to increase amount of physical activity.;Long Term: Exercising regularly at least 3-5 days a week.       Increase Strength and Stamina  Yes       Intervention  Provide advice, education, support and counseling about physical activity/exercise needs.;Develop an individualized exercise prescription for aerobic and resistive training based on initial evaluation findings, risk stratification, comorbidities and participant's personal goals.       Expected Outcomes  Short Term: Increase workloads from initial exercise prescription for resistance, speed, and METs.;Short Term: Perform resistance training exercises routinely during rehab and add in resistance training at home;Long Term: Improve cardiorespiratory fitness, muscular endurance and strength as measured by increased METs and functional capacity (6MWT)       Able to understand and use rate of perceived exertion (RPE) scale  Yes       Intervention  Provide education and explanation on how to use RPE scale       Expected Outcomes  Short Term: Able to use RPE daily in rehab to express subjective intensity level;Long Term:  Able to use RPE to guide intensity level when exercising independently  Able to understand and use Dyspnea scale  Yes       Intervention  Provide education and explanation on how to use Dyspnea scale       Expected Outcomes  Short Term: Able to use Dyspnea scale daily in rehab to express subjective sense of shortness of breath during exertion;Long Term: Able to use Dyspnea scale to guide intensity level when exercising independently       Knowledge and understanding of Target Heart Rate Range (THRR)  Yes       Intervention  Provide education and explanation of THRR  including how the numbers were predicted and where they are located for reference       Expected Outcomes  Short Term: Able to state/look up THRR;Short Term: Able to use daily as guideline for intensity in rehab;Long Term: Able to use THRR to govern intensity when exercising independently       Able to check pulse independently  Yes       Intervention  Provide education and demonstration on how to check pulse in carotid and radial arteries.;Review the importance of being able to check your own pulse for safety during independent exercise       Expected Outcomes  Short Term: Able to explain why pulse checking is important during independent exercise;Long Term: Able to check pulse independently and accurately       Understanding of Exercise Prescription  Yes       Intervention  Provide education, explanation, and written materials on patient's individual exercise prescription       Expected Outcomes  Short Term: Able to explain program exercise prescription;Long Term: Able to explain home exercise prescription to exercise independently          Exercise Goals Re-Evaluation : Exercise Goals Re-Evaluation    Row Name 09/24/18 0931 09/27/18 0933 10/05/18 1435 10/18/18 0921 11/16/18 1452     Exercise Goal Re-Evaluation   Exercise Goals Review  Increase Physical Activity;Increase Strength and Stamina;Able to understand and use rate of perceived exertion (RPE) scale;Knowledge and understanding of Target Heart Rate Range (THRR);Able to understand and use Dyspnea scale;Understanding of Exercise Prescription  Increase Physical Activity;Increase Strength and Stamina;Able to understand and use rate of perceived exertion (RPE) scale;Knowledge and understanding of Target Heart Rate Range (THRR);Able to understand and use Dyspnea scale;Able to check pulse independently;Understanding of Exercise Prescription  Increase Physical Activity;Increase Strength and Stamina;Understanding of Exercise Prescription  Increase  Physical Activity;Increase Strength and Stamina;Understanding of Exercise Prescription  Increase Physical Activity;Increase Strength and Stamina;Understanding of Exercise Prescription   Comments  Reviewed RPE scale, THR and program prescription with pt today.  Pt voiced understanding and was given a copy of goals to take home.  -  Alexander Barton is off to a good start in rehab.  He is already up to level 4 on the XR.  He is pleased to be back in the program to build his strength for surgery. We will continue to monitor his progres.  Alexander Barton is doing well in rehab.  He finds the standing exercise to be harder than the seated.  It is getting easier somewhat.  He is slowly regaining his strength.  He is walking at home on is off days.  Alexander Barton had been doing well in rehab.  He has tested postive for COVID-19 and will be out for 2 weeks.  He had worked his way up to level 9 on the XR.  We will continue to monitor his progress.   Expected Outcomes  Short: Use RPE daily to regulate intensity. Long: Follow program prescription in THR.  -  Short: Review home exericse guidelines. Long: Continue to follow program prescription.  Short: Continue to attend regulalry.  Long:Continue to build strength for surgery.  Short: Recover from [facility] and get cleared to return to rehab.  Long: Continue to rebuild stamina.      Discharge Exercise Prescription (Final Exercise Prescription Changes): Exercise Prescription Changes - 11/16/18 1400      Response to Exercise   Blood Pressure (Admit)  128/60    Blood Pressure (Exercise)  140/60    Blood Pressure (Exit)  124/72    Heart Rate (Admit)  77 bpm    Heart Rate (Exercise)  104 bpm    Heart Rate (Exit)  98 bpm    Oxygen Saturation (Admit)  97 %    Oxygen Saturation (Exercise)  95 %    Oxygen Saturation (Exit)  95 %    Rating of Perceived Exertion (Exercise)  13    Perceived Dyspnea (Exercise)  3    Symptoms  none    Duration  Continue with 30 min of aerobic exercise without  signs/symptoms of physical distress.    Intensity  THRR unchanged      Progression   Progression  Continue to progress workloads to maintain intensity without signs/symptoms of physical distress.    Average METs  3.42      Resistance Training   Training Prescription  Yes    Weight  5 lbs    Reps  10-15      Interval Training   Interval Training  No      Treadmill   MPH  3.2    Grade  0    Minutes  15    METs  3.45      Elliptical   Level  6    Speed  3.5    Minutes  15      REL-XR   Level  9    Minutes  15    METs  4.3      T5 Nustep   Level  8    Minutes  15    METs  2.5      Home Exercise Plan   Plans to continue exercise at  Home (comment)   walking   Frequency  Add 2 additional days to program exercise sessions.    Initial Home Exercises Provided  10/07/18       Nutrition:  Target Goals: Understanding of nutrition guidelines, daily intake of sodium <1559m, cholesterol <2025m calories 30% from fat and 7% or less from saturated fats, daily to have 5 or more servings of fruits and vegetables.  Biometrics: Pre Biometrics - 09/22/18 1228      Pre Biometrics   Height  6' 1.25" (1.861 m)    Weight  216 lb 14.4 oz (98.4 kg)    BMI (Calculated)  28.41        Nutrition Therapy Plan and Nutrition Goals: Nutrition Therapy & Goals - 09/22/18 1133      Nutrition Therapy   Drug/Food Interactions  Coumadin/Vit K    Protein (specify units)  80g    Fiber  30 grams    Whole Grain Foods  3 servings    Saturated Fats  12 max. grams    Fruits and Vegetables  5 servings/day    Sodium  1.5 grams      Personal Nutrition Goals   Nutrition Goal  ST: include  one plant based protein snack; to increase fiber and protein LT: lose ~10 lbs, get stronger for surgery    Comments  Pt reports eating 5 health bars for weight loss (12g pro, 100kcal each) with s lean protein and vegetable for the day - discussed how vit/min from whole foods was optimal and that was insuffient  kcals. Gave weight expectations and how his pro/kcal needs are higher due to his COPD. Pt reports lactose intolerance and only has dairy on occasion. Pt reports baking/grilling fish or chicken. Pt reports eating limited "junk foods" and even processed foods. In am will have coffee, sausage, eggs, and sometimes grits. Pt will not usually have lunch "not eating doesn't bother me", but will sometimes have a protein shake which is high in prootein but fairly low in calories (told pt that could make a good snack. Pt reports that he needs medication and the protein shake to stay regular, discussed fiber needs. Discussed HH eating. Pt reports that lasix is prescribed but has not been used (was prescribed over telehealth just in case).      Intervention Plan   Intervention  Prescribe, educate and counsel regarding individualized specific dietary modifications aiming towards targeted core components such as weight, hypertension, lipid management, diabetes, heart failure and other comorbidities.;Nutrition handout(s) given to patient.    Expected Outcomes  Short Term Goal: Understand basic principles of dietary content, such as calories, fat, sodium, cholesterol and nutrients.;Short Term Goal: A plan has been developed with personal nutrition goals set during dietitian appointment.;Long Term Goal: Adherence to prescribed nutrition plan.       Nutrition Assessments: Nutrition Assessments - 09/22/18 1144      MEDFICTS Scores   Pre Score  26       Nutrition Goals Re-Evaluation:   Nutrition Goals Discharge (Final Nutrition Goals Re-Evaluation):   Psychosocial: Target Goals: Acknowledge presence or absence of significant depression and/or stress, maximize coping skills, provide positive support system. Participant is able to verbalize types and ability to use techniques and skills needed for reducing stress and depression.   Initial Review & Psychosocial Screening: Initial Psych Review & Screening -  09/21/18 0814      Initial Review   Current issues with  History of Depression;Current Sleep Concerns   He sleeps about 5-6 hours a night, uses CBD oil to help, gets up two to three times to use bathroom and is often able to get back to sleep     Family Dynamics   Good Support System?  Yes   Wife [name]), daughter  (Amy), son Corene Cornea), 3 grandson, and good relationships wiht in-law   Comments  Attends a great church but has not been since [place], will wait til after surgery      Barriers   Psychosocial barriers to participate in program  There are no identifiable barriers or psychosocial needs.;The patient should benefit from training in stress management and relaxation.      Screening Interventions   Interventions  Encouraged to exercise;To provide support and resources with identified psychosocial needs    Expected Outcomes  Long Term Goal: Stressors or current issues are controlled or eliminated.;Short Term goal: Utilizing psychosocial counselor, staff and physician to assist with identification of specific Stressors or current issues interfering with healing process. Setting desired goal for each stressor or current issue identified.;Short Term goal: Identification and review with participant of any Quality of Life or Depression concerns found by scoring the questionnaire.;Long Term goal: The participant improves quality of Life and  PHQ9 Scores as seen by post scores and/or verbalization of changes       Quality of Life Scores:  Scores of 19 and below usually indicate a poorer quality of life in these areas.  A difference of  2-3 points is a clinically meaningful difference.  A difference of 2-3 points in the total score of the Quality of Life Index has been associated with significant improvement in overall quality of life, self-image, physical symptoms, and general health in studies assessing change in quality of life.  PHQ-9: Recent Review Flowsheet Data    Depression screen [facility] 2/9  10/18/2018 09/22/2018 09/10/2018 07/14/2018 01/04/2018   Decreased Interest 0 1 0 0 0   Down, Depressed, Hopeless 0 1 0 0 0   PHQ - 2 Score 0 2 0 0 0   Altered sleeping 1 2 - 0 -   Tired, decreased energy 2 2 - 0 -   Change in appetite 2 0 - 0 -   Feeling bad or failure about yourself  0 0 - 0 -   Trouble concentrating 2 1 - 0 -   Moving slowly or fidgety/restless 1 0 - 0 -   Suicidal thoughts 0 0 - 0 -   PHQ-9 Score 8 7 - 0 -   Difficult doing work/chores Not difficult at all Not difficult at all - Not difficult at all -     Interpretation of Total Score  Total Score Depression Severity:  1-4 = Minimal depression, 5-9 = Mild depression, 10-14 = Moderate depression, 15-19 = Moderately severe depression, 20-27 = Severe depression   Psychosocial Evaluation and Intervention:   Psychosocial Re-Evaluation: Psychosocial Re-Evaluation    [place] Name 10/18/18 0923             Psychosocial Re-Evaluation   Current issues with  History of Depression;Current Sleep Concerns;Current Stress Concerns       Comments  Alexander Barton is doing well in rehab.  He is preparing for a AAA repair surgery and his doctor wanted him to build up his strength first.  He has not had any depression symptoms.  He is sleeping better now and not taking anything for it.  His PHQ score went up slightly, but more related to energy and sleep.  He is feeling better overall.       Expected Outcomes  Short: Continue to improve Long: Continue to feel better and stay positive.          Psychosocial Discharge (Final Psychosocial Re-Evaluation): Psychosocial Re-Evaluation - 10/18/18 0923      Psychosocial Re-Evaluation   Current issues with  History of Depression;Current Sleep Concerns;Current Stress Concerns    Comments  Alexander Barton is doing well in rehab.  He is preparing for a AAA repair surgery and his doctor wanted him to build up his strength first.  He has not had any depression symptoms.  He is sleeping better now and not taking anything  for it.  His PHQ score went up slightly, but more related to energy and sleep.  He is feeling better overall.    Expected Outcomes  Short: Continue to improve Long: Continue to feel better and stay positive.       Education: Education Goals: Education classes will be provided on a weekly basis, covering required topics. Participant will state understanding/return demonstration of topics presented.  Learning Barriers/Preferences: Learning Barriers/Preferences - 09/21/18 0813      Learning Barriers/Preferences   Learning Barriers  Sight   glasses   Learning  Preferences  None       Education Topics:  Initial Evaluation Education: - Verbal, written and demonstration of respiratory meds, oximetry and breathing techniques. Instruction on use of nebulizers and MDIs and importance of monitoring MDI activations.   Pulmonary Rehab from 11/24/2016 in [facility] Cardiac and Pulmonary Rehab  Date  08/12/16  Educator  SB  Instruction Review Code (retired)  2- meets goals/outcomes      General Nutrition Guidelines/Fats and Fiber: -Group instruction provided by verbal, written material, models and posters to present the general guidelines for heart healthy nutrition. Gives an explanation and review of dietary fats and fiber.   Pulmonary Rehab from 11/24/2016 in [facility] Cardiac and Pulmonary Rehab  Date  11/17/16  Educator  CR  Instruction Review Code  1- Verbalizes Understanding      Controlling Sodium/Reading Food Labels: -Group verbal and written material supporting the discussion of sodium use in heart healthy nutrition. Review and explanation with models, verbal and written materials for utilization of the food label.   Pulmonary Rehab from 11/24/2016 in [facility] Cardiac and Pulmonary Rehab  Date  11/24/16  Educator  CR  Instruction Review Code  5- Refused Teaching      Exercise Physiology & General Exercise Guidelines: - Group verbal and written instruction with models to review the exercise  physiology of the cardiovascular system and associated critical values. Provides general exercise guidelines with specific guidelines to those with heart or lung disease.    Pulmonary Rehab from 11/24/2016 in [facility] Cardiac and Pulmonary Rehab  Date  11/07/16  Educator  Nada Maclachlan, EP  Instruction Review Code  2- Demonstrated Understanding      Aerobic Exercise & Resistance Training: - Gives group verbal and written instruction on the various components of exercise. Focuses on aerobic and resistive training programs and the benefits of this training and how to safely progress through these programs.   Pulmonary Rehab from 11/24/2016 in [facility] Cardiac and Pulmonary Rehab  Date  08/29/16  Educator  [facility]  Instruction Review Code  2- Demonstrated Understanding      Flexibility, Balance, Mind/Body Relaxation: Provides group verbal/written instruction on the benefits of flexibility and balance training, including mind/body exercise modes such as yoga, pilates and tai chi.  Demonstration and skill practice provided.   Pulmonary Rehab from 11/24/2016 in [facility] Cardiac and Pulmonary Rehab  Date  09/17/16  Educator  [facility]  Instruction Review Code  2- Demonstrated Understanding      Stress and Anxiety: - Provides group verbal and written instruction about the health risks of elevated stress and causes of high stress.  Discuss the correlation between heart/lung disease and anxiety and treatment options. Review healthy ways to manage with stress and anxiety.   Pulmonary Rehab from 11/24/2016 in [facility] Cardiac and Pulmonary Rehab  Date  10/22/16  Educator  [facility]  Instruction Review Code  1- Verbalizes Understanding      Depression: - Provides group verbal and written instruction on the correlation between heart/lung disease and depressed mood, treatment options, and the stigmas associated with seeking treatment.   Pulmonary Rehab from 11/24/2016 in [facility] Cardiac and Pulmonary Rehab  Date  09/24/16   Educator  [facility]  Instruction Review Code (retired)  2- meets goals/outcomes      Exercise & Equipment Safety: - Individual verbal instruction and demonstration of equipment use and safety with use of the equipment.   Cardiac Rehab from 09/22/2018 in [facility] Cardiac and Pulmonary Rehab  Date  09/22/18  Educator  AS  Instruction Review Code  1- Verbalizes Understanding      Infection Prevention: - Provides verbal and written material to individual with discussion of infection control including proper hand washing and proper equipment cleaning during exercise session.   Cardiac Rehab from 09/22/2018 in [facility] Cardiac and Pulmonary Rehab  Date  09/22/18  Educator  AS  Instruction Review Code  1- Verbalizes Understanding      Falls Prevention: - Provides verbal and written material to individual with discussion of falls prevention and safety.   Cardiac Rehab from 09/22/2018 in [facility] Cardiac and Pulmonary Rehab  Date  09/22/18  Educator  AS  Instruction Review Code  1- Verbalizes Understanding      Diabetes: - Individual verbal and written instruction to review signs/symptoms of diabetes, desired ranges of glucose level fasting, after meals and with exercise. Advice that pre and post exercise glucose checks will be done for 3 sessions at entry of program.   Chronic Lung Diseases: - Group verbal and written instruction to review updates, respiratory medications, advancements in procedures and treatments. Discuss use of supplemental oxygen including available portable oxygen systems, continuous and intermittent flow rates, concentrators, personal use and safety guidelines. Review proper use of inhaler and spacers. Provide informative websites for self-education.    Pulmonary Rehab from 11/24/2016 in [facility] Cardiac and Pulmonary Rehab  Date  11/05/16  Educator  [facility]  Instruction Review Code  1- Verbalizes Understanding      Energy Conservation: - Provide group verbal and written instruction  for methods to conserve energy, plan and organize activities. Instruct on pacing techniques, use of adaptive equipment and posture/positioning to relieve shortness of breath.   Pulmonary Rehab from 11/24/2016 in [facility] Cardiac and Pulmonary Rehab  Date  10/15/16  Educator  [facility]  Instruction Review Code  1- Verbalizes Understanding      Triggers and Exacerbations: - Group verbal and written instruction to review types of environmental triggers and ways to prevent exacerbations. Discuss weather changes, air quality and the benefits of nasal washing. Review warning signs and symptoms to help prevent infections. Discuss techniques for effective airway clearance, coughing, and vibrations.   AED/CPR: - Group verbal and written instruction with the use of models to demonstrate the basic use of the AED with the basic ABC's of resuscitation.   Anatomy and Physiology of the Lungs: - Group verbal and written instruction with the use of models to provide basic lung anatomy and physiology related to function, structure and complications of lung disease.   Pulmonary Rehab from 11/24/2016 in [facility] Cardiac and Pulmonary Rehab  Date  10/08/16  Educator  [facility]  Instruction Review Code  1- Verbalizes Understanding      Anatomy & Physiology of the Heart: - Group verbal and written instruction and models provide basic cardiac anatomy and physiology, with the coronary electrical and arterial systems. Review of Valvular disease and Heart Failure   Cardiac Medications: - Group verbal and written instruction to review commonly prescribed medications for heart disease. Reviews the medication, class of the drug, and side effects.   Pulmonary Rehab from 11/24/2016 in [facility] Cardiac and Pulmonary Rehab  Date  10/10/16  Educator  C. EnterkinRN  Instruction Review Code  1- Verbalizes Understanding      Know Your Numbers and Risk Factors: -Group verbal and written instruction about important numbers in your health.   Discussion of what are risk factors and how they play a role in the disease process.  Review of Cholesterol, Blood  Pressure, Diabetes, and BMI and the role they play in your overall health.   Sleep Hygiene: -Provides group verbal and written instruction about how sleep can affect your health.  Define sleep hygiene, discuss sleep cycles and impact of sleep habits. Review good sleep hygiene tips.    Other: -Provides group and verbal instruction on various topics (see comments)    Knowledge Questionnaire Score: Knowledge Questionnaire Score - 09/22/18 1235      Knowledge Questionnaire Score   Pre Score  15/18        Core Components/Risk Factors/Patient Goals at Admission: Personal Goals and Risk Factors at Admission - 09/22/18 1235      Core Components/Risk Factors/Patient Goals on Admission    Weight Management  Weight Loss;Yes    Intervention  Weight Management: Develop a combined nutrition and exercise program designed to reach desired caloric intake, while maintaining appropriate intake of nutrient and fiber, sodium and fats, and appropriate energy expenditure required for the weight goal.;Weight Management: Provide education and appropriate resources to help participant work on and attain dietary goals.    Admit Weight  216 lb 14.4 oz (98.4 kg)    Goal Weight: Short Term  219 lb (99.3 kg)    Goal Weight: Long Term  209 lb (94.8 kg)    Expected Outcomes  Short Term: Continue to assess and modify interventions until short term weight is achieved;Long Term: Adherence to nutrition and physical activity/exercise program aimed toward attainment of established weight goal;Weight Loss: Understanding of general recommendations for a balanced deficit meal plan, which promotes 1-2 lb weight loss per week and includes a negative energy balance of [phone] kcal/d;Understanding recommendations for meals to include 15-35% energy as protein, 25-35% energy from fat, 35-60% energy from carbohydrates,  less than 245m of dietary cholesterol, 20-35 gm of total fiber daily;Understanding of distribution of calorie intake throughout the day with the consumption of 4-5 meals/snacks    Improve shortness of breath with ADL's  Yes    Intervention  Provide education, individualized exercise plan and daily activity instruction to help decrease symptoms of SOB with activities of daily living.    Expected Outcomes  Short Term: Improve cardiorespiratory fitness to achieve a reduction of symptoms when performing ADLs;Long Term: Be able to perform more ADLs without symptoms or delay the onset of symptoms    Intervention  Provide education on lifestyle modifcations including regular physical activity/exercise, weight management, moderate sodium restriction and increased consumption of fresh fruit, vegetables, and low fat dairy, alcohol moderation, and smoking cessation.;Monitor prescription use compliance.    Expected Outcomes  Short Term: Continued assessment and intervention until BP is < 140/914mHG in hypertensive participants. < 130/8053mG in hypertensive participants with diabetes, heart failure or chronic kidney disease.;Long Term: Maintenance of blood pressure at goal levels.    Intervention  Provide education and support for participant on nutrition & aerobic/resistive exercise along with prescribed medications to achieve LDL <1m39mDL >40mg8m Expected Outcomes  Short Term: Participant states understanding of desired cholesterol values and is compliant with medications prescribed. Participant is following exercise prescription and nutrition guidelines.;Long Term: Cholesterol controlled with medications as prescribed, with individualized exercise RX and with personalized nutrition plan. Value goals: LDL < 1mg,69m > 40 mg.       Core Components/Risk Factors/Patient Goals Review:  Goals and Risk Factor Review    Row Name 10/18/18 0924             Core Components/Risk Factors/Patient Goals Review  Personal Goals Review  Weight Management/Obesity;Improve shortness of breath with ADL's;Hypertension;Lipids       Review  Alexander Barton's weight has plateaued.  He has had house guest and entertaining, now that they are gone, he is hoping to get back on track with his weight loss.  He feels that his breathing is improving.  He checks his pressures daily at home and they are doing well.  He is doing well on his medications.       Expected Outcomes  Short: Get back to eating better for weight loss.  Long: Continue to improve breathing.          Core Components/Risk Factors/Patient Goals at Discharge (Final Review):  Goals and Risk Factor Review - 10/18/18 0924      Core Components/Risk Factors/Patient Goals Review   Personal Goals Review  Weight Management/Obesity;Improve shortness of breath with ADL's;Hypertension;Lipids    Review  Alexander Barton's weight has plateaued.  He has had house guest and entertaining, now that they are gone, he is hoping to get back on track with his weight loss.  He feels that his breathing is improving.  He checks his pressures daily at home and they are doing well.  He is doing well on his medications.    Expected Outcomes  Short: Get back to eating better for weight loss.  Long: Continue to improve breathing.       ITP Comments: ITP Comments    Row Name 09/21/18 0824 09/21/18 0832 09/22/18 1218 10/13/18 0721 11/01/18 0943   ITP Comments  Currently on study with [facility] looking at sodium versus statins.  Not sure what he is taking, but numbers seem to be good.  Virtual orientation visit completed.  Documentation for diagnosis can be found in [facility] encounter 8/7.  Completed 6MWT and nutrition eval Initial ITP created and sent to Dr Sabra Heck.  30 Day Review Completed today. Continue with ITP unless changed by Medical Director review.  Alexander Barton did report extra shortness of breath this morning while getting ready for the day. His weight was the same and vital signs were within acceptable  ranges. Exercise was tolerated but with a little extra shortness of breath. Alexander Barton was advised to call his doctor and report this change in symptoms.   [place] Name 11/10/18 1107 11/16/18 1452 12/08/18 0716       ITP Comments  30 day review completed. ITP sent to Dr. Emily Filbert, Medical Director of Cardiac and Pulmonary Rehab. Continue with ITP unless changes are made by physician.  Department closed starting 10/2 until further notice by infection prevention and Health at Work teams for [place].  Alexander Barton has tested postive for COVID-19 on 10/7.  He will be out for 14 days.  30 day review completed. Continue with ITP sent to Dr. Emily Filbert, Medical Director of Cardiac and Pulmonary Rehab for review , changes as needed and signature.  Out for medical reason Alexander Barton is scheduled For surgery on an aneurysm and surgeon wants Alexander Barton to hold on program until after surgery        Comments:

## 2018-12-09 HISTORY — PX: OTHER SURGICAL HISTORY: SHX169

## 2018-12-13 MED ORDER — TIOTROPIUM BROMIDE MONOHYDRATE 18 MCG IN CAPS
1.00 | ORAL_CAPSULE | RESPIRATORY_TRACT | Status: DC
Start: 2018-12-14 — End: 2018-12-13

## 2018-12-13 MED ORDER — RISPERIDONE 0.5 MG PO TABS
0.50 | ORAL_TABLET | ORAL | Status: DC
Start: 2018-12-13 — End: 2018-12-13

## 2018-12-13 MED ORDER — ASPIRIN 81 MG PO CHEW
81.00 | CHEWABLE_TABLET | ORAL | Status: DC
Start: 2018-12-14 — End: 2018-12-13

## 2018-12-13 MED ORDER — ACETAMINOPHEN 325 MG PO TABS
975.00 | ORAL_TABLET | ORAL | Status: DC
Start: 2018-12-13 — End: 2018-12-13

## 2018-12-13 MED ORDER — MELATONIN 3 MG PO TABS
3.00 | ORAL_TABLET | ORAL | Status: DC
Start: 2018-12-13 — End: 2018-12-13

## 2018-12-13 MED ORDER — DEXTROSE 50 % IV SOLN
12.50 | INTRAVENOUS | Status: DC
Start: ? — End: 2018-12-13

## 2018-12-13 MED ORDER — BUDESONIDE-FORMOTEROL FUMARATE 160-4.5 MCG/ACT IN AERO
2.00 | INHALATION_SPRAY | RESPIRATORY_TRACT | Status: DC
Start: 2018-12-13 — End: 2018-12-13

## 2018-12-13 MED ORDER — TAMSULOSIN HCL 0.4 MG PO CAPS
0.80 | ORAL_CAPSULE | ORAL | Status: DC
Start: 2018-12-13 — End: 2018-12-13

## 2018-12-13 MED ORDER — LIDOCAINE 5 % EX PTCH
2.00 | MEDICATED_PATCH | CUTANEOUS | Status: DC
Start: 2018-12-13 — End: 2018-12-13

## 2018-12-13 MED ORDER — RISPERIDONE 0.5 MG PO TABS
0.50 | ORAL_TABLET | ORAL | Status: DC
Start: ? — End: 2018-12-13

## 2018-12-13 MED ORDER — POLYETHYLENE GLYCOL 3350 17 G PO PACK
17.00 | PACK | ORAL | Status: DC
Start: 2018-12-14 — End: 2018-12-13

## 2018-12-13 MED ORDER — FUROSEMIDE 20 MG PO TABS
20.00 | ORAL_TABLET | ORAL | Status: DC
Start: 2018-12-13 — End: 2018-12-13

## 2018-12-13 MED ORDER — WARFARIN SODIUM 5 MG PO TABS
5.00 | ORAL_TABLET | ORAL | Status: DC
Start: ? — End: 2018-12-13

## 2018-12-13 MED ORDER — POTASSIUM CHLORIDE ER 20 MEQ PO TBCR
20.00 | EXTENDED_RELEASE_TABLET | ORAL | Status: DC
Start: 2018-12-14 — End: 2018-12-13

## 2018-12-13 MED ORDER — GLUCAGON HCL RDNA (DIAGNOSTIC) 1 MG IJ SOLR
1.00 | INTRAMUSCULAR | Status: DC
Start: ? — End: 2018-12-13

## 2018-12-13 MED ORDER — PANTOPRAZOLE SODIUM 40 MG PO TBEC
40.00 | DELAYED_RELEASE_TABLET | ORAL | Status: DC
Start: 2018-12-14 — End: 2018-12-13

## 2018-12-13 MED ORDER — PRAVASTATIN SODIUM 20 MG PO TABS
20.00 | ORAL_TABLET | ORAL | Status: DC
Start: 2018-12-13 — End: 2018-12-13

## 2018-12-13 MED ORDER — GENERIC EXTERNAL MEDICATION
Status: DC
Start: ? — End: 2018-12-13

## 2018-12-13 MED ORDER — METOPROLOL TARTRATE 25 MG PO TABS
25.00 | ORAL_TABLET | ORAL | Status: DC
Start: 2018-12-13 — End: 2018-12-13

## 2018-12-13 MED ORDER — SENNOSIDES-DOCUSATE SODIUM 8.6-50 MG PO TABS
2.00 | ORAL_TABLET | ORAL | Status: DC
Start: 2018-12-13 — End: 2018-12-13

## 2018-12-13 MED ORDER — IPRATROPIUM-ALBUTEROL 0.5-2.5 (3) MG/3ML IN SOLN
3.00 | RESPIRATORY_TRACT | Status: DC
Start: ? — End: 2018-12-13

## 2018-12-13 MED ORDER — ONDANSETRON HCL 4 MG/2ML IJ SOLN
4.00 | INTRAMUSCULAR | Status: DC
Start: ? — End: 2018-12-13

## 2018-12-13 MED ORDER — AMIODARONE HCL 200 MG PO TABS
200.00 | ORAL_TABLET | ORAL | Status: DC
Start: 2018-12-13 — End: 2018-12-13

## 2018-12-15 ENCOUNTER — Encounter: Payer: Self-pay | Admitting: *Deleted

## 2018-12-15 DIAGNOSIS — J449 Chronic obstructive pulmonary disease, unspecified: Secondary | ICD-10-CM

## 2018-12-20 ENCOUNTER — Telehealth: Payer: Self-pay | Admitting: Family Medicine

## 2018-12-20 DIAGNOSIS — F05 Delirium due to known physiological condition: Secondary | ICD-10-CM | POA: Insufficient documentation

## 2018-12-20 DIAGNOSIS — N390 Urinary tract infection, site not specified: Secondary | ICD-10-CM | POA: Insufficient documentation

## 2018-12-20 NOTE — Telephone Encounter (Signed)
Unable to reach patient for transitional care management. Left message stating the nature of the call. Will follow as appropriate.

## 2018-12-20 NOTE — Telephone Encounter (Signed)
Noted! Thank you

## 2018-12-20 NOTE — Telephone Encounter (Signed)
Patient [name] on 12/13/18 for a Aortic Dissection, and went into cardiac rehab do  Not know date of DC from Rehab just he has appointment on 12/21/18 for HFU with Guse NP.

## 2018-12-21 ENCOUNTER — Inpatient Hospital Stay: Payer: Medicare Other | Admitting: Family Medicine

## 2018-12-23 ENCOUNTER — Telehealth: Payer: Self-pay

## 2018-12-23 NOTE — Telephone Encounter (Signed)
Noted.  Need to know when he needs pt/inr checked - if needs before scheduled appt.  Confirm pt doing ok.

## 2018-12-23 NOTE — Telephone Encounter (Signed)
Alexander clarify exactly what is needed.  Thanks

## 2018-12-23 NOTE — Telephone Encounter (Signed)
Called [facility] Discharge case Management left message to return call to  Office. I have blocked space on Dr. Nicki Reaper schedule for Monday for Virtual follow up and we can go from there wit appropriate follow up.Will CC. Transitional Care Management also.

## 2018-12-23 NOTE — Telephone Encounter (Signed)
Copied from [place] [id number]. Topic: Appointment Scheduling - Scheduling Inquiry for Clinic >> Dec 23, 2018 12:18 PM Rutherford Nail, [place] wrote: Reason for CRM: [facility] calling and states that the patient is being discharged and is needing an appointment ASAP to check INR. States Monday is when the appointment would preferably need to be. Dr [name] out of office. Please advise.

## 2018-12-24 ENCOUNTER — Telehealth: Payer: Self-pay

## 2018-12-24 NOTE — Telephone Encounter (Addendum)
Spoke with Patient and he will not be discharged today, he is till in the hospital at [facility]. Discharge may be Monday. I have removed the hold on MD schedule for this patient .

## 2018-12-24 NOTE — Telephone Encounter (Signed)
Spoke w/ Claiborne Billings from [place]. Pt has been in the hospital for about 1 week for UTI.  Hospital MD has been holding coumadin because INR has been high.  Today INR is at 5.  Pt needs to f/u after discharge to have INR monitored.  Pt will be either be discharged this afternoon or tomorrow.   Pt has an appt w/ cardiologist on Mond from 9:30 -12 pm.   Pt needs a f/u appt and INR check.  Claiborne Billings will call pt's cardiologist to request INR check while at his cardiologist appt on Monday.  I will forward notes to lead nurse to schedule hospitial f/u appt.

## 2018-12-24 NOTE — Telephone Encounter (Signed)
INr will be drawn a Cariology on Monday if DC as planned.

## 2018-12-24 NOTE — Telephone Encounter (Signed)
Copied from [place] [phone]. Topic: General - Other >> Dec 24, 2018  8:53 AM Rainey Pines A wrote: Claiborne Billings from [facility] is requesting a callback at  [phone] in regards to patient being scheduled for INR check on 11/23 after 12pm due to patient appt with cardiologist that morning. Please advise

## 2018-12-24 NOTE — Telephone Encounter (Signed)
Called Alexander Barton w/ [facility] back.  No answer.  LMTCB.

## 2018-12-24 NOTE — Telephone Encounter (Signed)
What do they need?

## 2018-12-24 NOTE — Telephone Encounter (Signed)
Ok.  Let me know if I need to do anything more.

## 2018-12-25 MED ORDER — ACETAMINOPHEN 325 MG PO TABS
650.00 | ORAL_TABLET | ORAL | Status: DC
Start: 2018-12-24 — End: 2018-12-25

## 2018-12-25 MED ORDER — MELATONIN 3 MG PO TABS
9.00 | ORAL_TABLET | ORAL | Status: DC
Start: 2018-12-24 — End: 2018-12-25

## 2018-12-25 MED ORDER — POLYETHYLENE GLYCOL 3350 17 G PO PACK
17.00 | PACK | ORAL | Status: DC
Start: 2018-12-25 — End: 2018-12-25

## 2018-12-25 MED ORDER — GENERIC EXTERNAL MEDICATION
1.00 | Status: DC
Start: 2018-12-25 — End: 2018-12-25

## 2018-12-25 MED ORDER — ALBUTEROL SULFATE HFA 108 (90 BASE) MCG/ACT IN AERS
2.00 | INHALATION_SPRAY | RESPIRATORY_TRACT | Status: DC
Start: ? — End: 2018-12-25

## 2018-12-25 MED ORDER — METOPROLOL TARTRATE 25 MG PO TABS
25.00 | ORAL_TABLET | ORAL | Status: DC
Start: 2018-12-24 — End: 2018-12-25

## 2018-12-25 MED ORDER — LIDOCAINE HCL 1 % IJ SOLN
0.50 | INTRAMUSCULAR | Status: DC
Start: ? — End: 2018-12-25

## 2018-12-25 MED ORDER — GLUCAGON HCL RDNA (DIAGNOSTIC) 1 MG IJ SOLR
1.00 | INTRAMUSCULAR | Status: DC
Start: ? — End: 2018-12-25

## 2018-12-25 MED ORDER — GENERIC EXTERNAL MEDICATION
2.00 | Status: DC
Start: 2018-12-25 — End: 2018-12-25

## 2018-12-25 MED ORDER — TAMSULOSIN HCL 0.4 MG PO CAPS
0.40 | ORAL_CAPSULE | ORAL | Status: DC
Start: 2018-12-25 — End: 2018-12-25

## 2018-12-25 MED ORDER — DEXTROSE 50 % IV SOLN
12.50 | INTRAVENOUS | Status: DC
Start: ? — End: 2018-12-25

## 2018-12-25 MED ORDER — ONDANSETRON HCL 4 MG/2ML IJ SOLN
4.00 | INTRAMUSCULAR | Status: DC
Start: 2018-12-25 — End: 2018-12-25

## 2018-12-25 MED ORDER — DOCOSANOL 10 % EX CREA
1.00 | TOPICAL_CREAM | CUTANEOUS | Status: DC
Start: 2018-12-24 — End: 2018-12-25

## 2018-12-25 NOTE — Telephone Encounter (Signed)
Noted.  Let me know if we need to do anything.

## 2018-12-27 ENCOUNTER — Telehealth: Payer: Self-pay

## 2018-12-27 ENCOUNTER — Encounter: Payer: Self-pay | Admitting: *Deleted

## 2018-12-27 ENCOUNTER — Ambulatory Visit (INDEPENDENT_AMBULATORY_CARE_PROVIDER_SITE_OTHER): Payer: Medicare Other | Admitting: Pharmacist

## 2018-12-27 DIAGNOSIS — J449 Chronic obstructive pulmonary disease, unspecified: Secondary | ICD-10-CM

## 2018-12-27 DIAGNOSIS — Z7901 Long term (current) use of anticoagulants: Secondary | ICD-10-CM

## 2018-12-27 DIAGNOSIS — I359 Nonrheumatic aortic valve disorder, unspecified: Secondary | ICD-10-CM

## 2018-12-27 LAB — POCT INR: INR: 1.6 — AB (ref 2.0–3.0)

## 2018-12-27 NOTE — Telephone Encounter (Signed)
It appears that coumadin clinic recheck pt/inr and is adjusting medication.  Let me know if needs anything prior to f/u appt.

## 2018-12-27 NOTE — Telephone Encounter (Signed)
FYI patient was DC on 12/25/18 and patient was followed up today with Cardiology for INR of 6.2, left message to call and schedule Follow up with PCP.

## 2018-12-27 NOTE — Telephone Encounter (Signed)
Copied from [place] [id number]. Topic: Appointment Scheduling - Scheduling Inquiry for Clinic >> Dec 27, 2018  1:30 PM Mathis Bud wrote: Reason for CRM: susan patients wife called stating patient an appt asap due to his mri results.  Call back [phone]

## 2018-12-27 NOTE — Telephone Encounter (Signed)
Pt is scheduled for follow up on 12/16

## 2018-12-27 NOTE — Telephone Encounter (Signed)
Pt's wife stated that Pt's surgeon wanted pt to schedule an appt to see if pt should have PT, OT or cardio.  Pt was advised to do cardio 6 week post-op.  Pt's wife reported that INR today at appt was 1.70.  Results have been given to cardiologist who manages his INR.    Pt already has an appt scheduled on 01/19/19.  No appts available before then.  Pt's wife said that pt will be ok to wait.

## 2018-12-29 ENCOUNTER — Telehealth: Payer: Self-pay | Admitting: Cardiology

## 2018-12-29 ENCOUNTER — Encounter (INDEPENDENT_AMBULATORY_CARE_PROVIDER_SITE_OTHER): Payer: Medicare Other | Admitting: Cardiovascular Disease

## 2018-12-29 ENCOUNTER — Ambulatory Visit: Payer: Self-pay | Admitting: Pharmacist Clinician (PhC)/ Clinical Pharmacy Specialist

## 2018-12-29 DIAGNOSIS — Z7901 Long term (current) use of anticoagulants: Secondary | ICD-10-CM | POA: Diagnosis not present

## 2018-12-29 DIAGNOSIS — I359 Nonrheumatic aortic valve disorder, unspecified: Secondary | ICD-10-CM

## 2018-12-29 LAB — POCT INR
INR: 2.2 (ref 2.0–3.0)
INR: 2.2 (ref 2.0–3.0)

## 2018-12-29 NOTE — Telephone Encounter (Signed)
Made an encounter for anticoagulation

## 2018-12-29 NOTE — Telephone Encounter (Signed)
Patient reports,  INR: 2.2 at 12:20pm

## 2018-12-29 NOTE — Progress Notes (Signed)
This encounter was created in error - please disregard.

## 2019-01-05 ENCOUNTER — Ambulatory Visit (INDEPENDENT_AMBULATORY_CARE_PROVIDER_SITE_OTHER): Payer: Medicare Other | Admitting: Cardiovascular Disease

## 2019-01-05 ENCOUNTER — Encounter: Payer: Self-pay | Admitting: *Deleted

## 2019-01-05 DIAGNOSIS — R0600 Dyspnea, unspecified: Secondary | ICD-10-CM

## 2019-01-05 DIAGNOSIS — I429 Cardiomyopathy, unspecified: Secondary | ICD-10-CM | POA: Insufficient documentation

## 2019-01-05 DIAGNOSIS — Z7189 Other specified counseling: Secondary | ICD-10-CM | POA: Insufficient documentation

## 2019-01-05 DIAGNOSIS — I359 Nonrheumatic aortic valve disorder, unspecified: Secondary | ICD-10-CM

## 2019-01-05 DIAGNOSIS — J449 Chronic obstructive pulmonary disease, unspecified: Secondary | ICD-10-CM

## 2019-01-05 DIAGNOSIS — Z7901 Long term (current) use of anticoagulants: Secondary | ICD-10-CM

## 2019-01-05 LAB — CBC
Hematocrit: 28 % — ABNORMAL LOW (ref 37.5–51.0)
Hemoglobin: 9.3 g/dL — ABNORMAL LOW (ref 13.0–17.7)
MCH: 30.1 pg (ref 26.6–33.0)
MCHC: 33.2 g/dL (ref 31.5–35.7)
MCV: 91 fL (ref 79–97)
Platelets: 352 10*3/uL (ref 150–450)
RBC: 3.09 x10E6/uL — ABNORMAL LOW (ref 4.14–5.80)
RDW: 15.7 % — ABNORMAL HIGH (ref 11.6–15.4)
WBC: 5.6 10*3/uL (ref 3.4–10.8)

## 2019-01-05 LAB — BASIC METABOLIC PANEL
BUN/Creatinine Ratio: 15 (ref 10–24)
BUN: 16 mg/dL (ref 8–27)
CO2: 25 mmol/L (ref 20–29)
Calcium: 8.8 mg/dL (ref 8.6–10.2)
Chloride: 102 mmol/L (ref 96–106)
Creatinine, Ser: 1.07 mg/dL (ref 0.76–1.27)
GFR calc Af Amer: 81 mL/min/{1.73_m2} (ref >59)
GFR calc non Af Amer: 70 mL/min/{1.73_m2} (ref >59)
Glucose: 115 mg/dL — ABNORMAL HIGH (ref 65–99)
Potassium: 4.7 mmol/L (ref 3.5–5.2)
Sodium: 136 mmol/L (ref 134–144)

## 2019-01-05 LAB — POCT INR: INR: 5.8 — AB (ref 2.0–3.0)

## 2019-01-05 NOTE — Progress Notes (Signed)
Cardiology Office Note   Date:  01/07/2019   ID:  Alexander, Barton 05-Jan-1951, MRN [id number]  PCP:  Alexander Haven, MD  Cardiologist:   No primary care provider on file.   Chief Complaint  Patient presents with  . Fatigue      History of Present Illness: Alexander Barton is a 68 y.o. male who presents for follow-up of a complicated cardiac history.  He had a redo sternotomy after previous history of aortic valve replacement.  He also had a complex type B aortic dissection.  On this admission to [facility] earlier this year he had hemiarch replacement followed by TEVAR (with SLCA coverage and vascular plus left carotid subclavian bypass) he had to be readmitted with sepsis.  I do note that he has had CT on November 24.  There was visualization of a large aortic dissection that began proximal to the takeoff of the occluded left subclavian artery.  He had a patent stent graft.  However, looking at this complex resolved it does not look like there was any acute change in the chronic appearance of an aneurysmal sac.    The INR was checked yesterday in our office and it was elevated and he has been told to hold 2 doses.  His hemoglobin was 9.3.  He was added onto my schedule.  I did look back at his most recent blood work he was 9.2 on 23 November.  White blood cell count was normal.  Renal function was normal.  To complicate everything he had Covid in early October and is not sure that he ever really recovered completely from this.  He had done well initially after his first surgery.  He then had a urinary infection and is never fully recovered from that episode of symptoms.  He has been weak and walking with a walker.  He is about to start some physical therapy.  He has had nosebleeds that started yesterday.  He was having blood streaked nasal discharge but not copious bleeding.  He has not had any new chest pressure, neck or arm discomfort.  Not really noticing any new palpitations,  presyncope or syncope.  He has episodes where he has a hard time catching his breath but these past.  He says his oxygen saturation is okay when that happens.  He just feels weaker than he thought he would but not dramatically different than when he got out of the hospital most recently.   Past Medical History:  Diagnosis Date  . Aortic aneurysm, thoracic ([place])   . Aortic valve disease   . Arthritis   . BPH (benign prostatic hyperplasia)   . COPD (chronic obstructive pulmonary disease) ([place])   . GERD (gastroesophageal reflux disease)   . HTN (hypertension)   . Hypercholesterolemia   . Stroke ([place])   . TIA (transient ischemic attack)     Past Surgical History:  Procedure Laterality Date  . ACHILLES TENDON REPAIR    . AORTIC VALVE REPLACEMENT     #23 On-X valve conduit  . ASCENDING AORTIC ANEURYSM REPAIR    . BACK SURGERY    . FOOT SURGERY       Current Outpatient Medications  Medication Sig Dispense Refill  . albuterol (VENTOLIN HFA) 108 (90 Base) MCG/ACT inhaler Inhale 2 puffs into the lungs every 4 (four) hours as needed for wheezing or shortness of breath. 18 g 1  . aspirin 81 MG tablet Take 81 mg by mouth at bedtime.     Marland Kitchen  benzonatate (TESSALON) 100 MG capsule Take 1 or 2 capsules every 8 hours as needed for cough 30 capsule 1  . budesonide-formoterol (SYMBICORT) 160-4.5 MCG/ACT inhaler TAKE 2 PUFFS BY MOUTH TWICE A DAY 30.6 Inhaler 4  . furosemide (LASIX) 20 MG tablet TAKE 1/2 TABLET (10 MG) 1 OR 2 TIMES A DAY AS NEEDED FOR LEG SWELLING 90 tablet 1  . metoprolol succinate (TOPROL-XL) 50 MG 24 hr tablet TAKE 1 TABLET (50 MG TOTAL) BY MOUTH DAILY. TAKE WITH OR IMMEDIATELY FOLLOWING A MEAL. 90 tablet 1  . NON FORMULARY     . Polyethylene Glycol 3350 (MIRALAX PO) Take by mouth.    . senna-docusate (SENOKOT-S) 8.6-50 MG tablet Take by mouth.    . tamsulosin (FLOMAX) 0.4 MG CAPS capsule TAKE 2 CAPSULES BY MOUTH EVERY DAY 174 capsule 1  . Tiotropium Bromide Monohydrate (SPIRIVA  RESPIMAT) 2.5 MCG/ACT AERS INHALE 2.5 MCG INTO THE LUNGS 2 (TWO) TIMES DAILY. 1 Inhaler 5  . warfarin (COUMADIN) 2 MG tablet TAKE 1 TABLET WITH 5 MG TABLET DAILY AS DIRECTED BY COUMADIN CLINIC 90 tablet 0  . warfarin (COUMADIN) 5 MG tablet TAKE 1 TABLET DAILY AS DIRECTED WITH 2 MG TABLET 90 tablet 1  . amLODipine (NORVASC) 5 MG tablet TAKE 1 TABLET BY MOUTH EVERY DAY (Patient not taking: Reported on 01/06/2019) 90 tablet 1   No current facility-administered medications for this visit.     Allergies:   Codeine, Gadolinium derivatives, Hydrocodone, Iodinated diagnostic agents, Statins, Hydromorphone, Lipitor [atorvastatin calcium], Other, and Seroquel [quetiapine fumerate]    ROS:  Please see the history of present illness.   Otherwise, review of systems are positive for none.   All other systems are reviewed and negative.    PHYSICAL EXAM: VS:  BP 110/66   Pulse 99   Temp (!) 96.6 F (35.9 C)   Ht 6\' 1"  (1.854 m)   Wt 199 lb (90.3 kg)   SpO2 98%   BMI 26.25 kg/m  , BMI Body mass index is 26.25 kg/m. GENERAL: Frail appearing  HEENT:  Pupils equal round and reactive, fundi not visualized, oral mucosa unremarkable NECK:  No jugular venous distention, waveform within normal limits, carotid upstroke brisk and symmetric, no bruits, no thyromegaly LYMPHATICS:  No cervical, inguinal adenopathy LUNGS:  Clear to auscultation bilaterally BACK:  No CVA tenderness CHEST: Well-healed multiple surgical scars HEART:  PMI not displaced or sustained,S1 within normal limits and mechanical S2 within normal limits, no S3, no S4, no clicks, no rubs, positive systolic murmur murmurs ABD:  Flat, positive bowel sounds normal in frequency in pitch, no bruits, no rebound, no guarding, no midline pulsatile mass, no hepatomegaly, no splenomegaly EXT:  2 plus pulses throughout, no edema, no cyanosis no clubbing SKIN:  No rashes no nodules NEURO:  Cranial nerves II through XII grossly intact, motor grossly intact  throughout PSYCH:  Cognitively intact, oriented to person place and time    EKG:  EKG is not ordered today. The ekg ordered 09/22/2018 demonstrates sinus rhythm, rate 80, premature ectopic complexes, no acute ST-T wave changes.   Recent Labs: 01/05/2019: BUN 16; Creatinine, Ser 1.07; Hemoglobin 9.3; Platelets 352; Potassium 4.7; Sodium 136    Lipid Panel    Component Value Date/Time   CHOL 253 (H) 12/10/2016 1031   TRIG 112.0 12/10/2016 1031   HDL 45.00 12/10/2016 1031   CHOLHDL 6 12/10/2016 1031   VLDL 22.4 12/10/2016 1031   LDLCALC 185 (H) 12/10/2016 1031   LDLDIRECT 172.0  11/14/2014 1012      Wt Readings from Last 3 Encounters:  01/06/19 199 lb (90.3 kg)  11/10/18 215 lb (97.5 kg)  09/22/18 219 lb 14.4 oz (99.7 kg)      Other studies Reviewed: Additional studies/ records that were reviewed today include: Extensive review of [facility] records.  (Greater than 40 minutes reviewing all data with greater than 50% face to face with the patient).  Review of the above records demonstrates:  Please see elsewhere in the note.     ASSESSMENT AND PLAN:  CARDIOMYOPATHY:  EF was 40 - 45% in 2018.   However, the echo that I see preop from July looked like the EF was 55%.  I cannot find the results of the TEE.  If he continues to feel poorly and have a low threshold to repeat this.  MECHANICAL AVR: As above.  His INR is being followed closely and he has been given instructions on holding 2 doses and will get a check again tomorrow.  WEAKNESS: He has multiple reasons to have weakness.  I think there was a concern that his hemoglobin had fallen but this appears to actually be increased compared to the most recent one at [facility].  His exam is otherwise not acute.  He has no acute complaints but I had like him to come back on Monday for repeat blood work including a CBC.  He is describing some brown urine if this is still ongoing we might repeat a UA.  He is not having any fevers or chills and has  no stigmata of endocarditis.  He will be placed on Dr. Doug Sou schedule so that we can keep a close eye on him.  Check a TSH and a comprehensive metabolic profile on Monday as well.  COVID ED: We talked about whether or not vaccine would be indicated given the fact that he has been infected and I think will have more data around this time that is being offered to the public and high risk individuals.  He and I talked about that.  Current medicines are reviewed at length with the patient today.  The patient does not have concerns regarding medicines.  The following changes have been made:  no change  Labs/ tests ordered today include:   Orders Placed This Encounter  Procedures  . Comprehensive metabolic panel  . CBC  . TSH     Disposition:   FU with Dr. [name] on Monday.     Signed, Minus Breeding, MD  01/07/2019 1:13 PM    [facility]

## 2019-01-05 NOTE — Progress Notes (Signed)
Pulmonary Individual Treatment Plan  Patient Details  Name: Alexander Barton MRN: [id number] Date of Birth: 1951/01/24 Referring Provider:     Cardiac Rehab from 09/22/2018 in [facility] Cardiac and Pulmonary Rehab  Referring Provider  Sonnenberg      Initial Encounter Date:    Cardiac Rehab from 09/22/2018 in [facility] Cardiac and Pulmonary Rehab  Date  09/22/18      Visit Diagnosis: Chronic obstructive pulmonary disease, unspecified COPD type ([place])  Dyspnea, unspecified type  Patient's Home Medications on Admission:  Current Outpatient Medications:  .  albuterol (VENTOLIN HFA) 108 (90 Base) MCG/ACT inhaler, Inhale 2 puffs into the lungs every 4 (four) hours as needed for wheezing or shortness of breath., Disp: 18 g, Rfl: 1 .  amLODipine (NORVASC) 5 MG tablet, TAKE 1 TABLET BY MOUTH EVERY DAY, Disp: 90 tablet, Rfl: 1 .  aspirin 81 MG tablet, Take 81 mg by mouth at bedtime. , Disp: , Rfl:  .  benzonatate (TESSALON) 100 MG capsule, Take 1 or 2 capsules every 8 hours as needed for cough, Disp: 30 capsule, Rfl: 1 .  budesonide-formoterol (SYMBICORT) 160-4.5 MCG/ACT inhaler, TAKE 2 PUFFS BY MOUTH TWICE A DAY, Disp: 30.6 Inhaler, Rfl: 4 .  enoxaparin (LOVENOX) 100 MG/ML injection, Inject 1 mL (100 mg total) into the skin every 12 (twelve) hours. As instructed by coumadin clinic, Disp: 20 mL, Rfl: 0 .  furosemide (LASIX) 20 MG tablet, TAKE 1/2 TABLET (10 MG) 1 OR 2 TIMES A DAY AS NEEDED FOR LEG SWELLING, Disp: 90 tablet, Rfl: 1 .  metoprolol succinate (TOPROL-XL) 50 MG 24 hr tablet, TAKE 1 TABLET (50 MG TOTAL) BY MOUTH DAILY. TAKE WITH OR IMMEDIATELY FOLLOWING A MEAL., Disp: 90 tablet, Rfl: 1 .  NON FORMULARY, , Disp: , Rfl:  .  Polyethylene Glycol 3350 (MIRALAX PO), Take by mouth., Disp: , Rfl:  .  senna-docusate (SENOKOT-S) 8.6-50 MG tablet, Take by mouth., Disp: , Rfl:  .  tamsulosin (FLOMAX) 0.4 MG CAPS capsule, TAKE 2 CAPSULES BY MOUTH EVERY DAY, Disp: 174 capsule, Rfl: 1 .  Tiotropium  Bromide Monohydrate (SPIRIVA RESPIMAT) 2.5 MCG/ACT AERS, INHALE 2.5 MCG INTO THE LUNGS 2 (TWO) TIMES DAILY., Disp: 1 Inhaler, Rfl: 5 .  warfarin (COUMADIN) 2 MG tablet, TAKE 1 TABLET WITH 5 MG TABLET DAILY AS DIRECTED BY COUMADIN CLINIC, Disp: 90 tablet, Rfl: 0 .  warfarin (COUMADIN) 5 MG tablet, TAKE 1 TABLET DAILY AS DIRECTED WITH 2 MG TABLET, Disp: 90 tablet, Rfl: 1  Past Medical History: Past Medical History:  Diagnosis Date  . Aortic aneurysm, thoracic ([place])   . Aortic valve disease   . Arthritis   . BPH (benign prostatic hyperplasia)   . COPD (chronic obstructive pulmonary disease) ([place])   . GERD (gastroesophageal reflux disease)   . HTN (hypertension)   . Hypercholesterolemia   . Stroke ([place])   . TIA (transient ischemic attack)     Tobacco Use: Social History   Tobacco Use  Smoking Status Never Smoker  Smokeless Tobacco Never Used    Labs: Recent Review Flowsheet Data    Labs for ITP Cardiac and Pulmonary Rehab Latest Ref Rng & Units 06/04/2012 11/14/2014 10/03/2015 12/10/2016 12/22/2016   Cholestrol 0 - 200 mg/dL 247(H) 254(H) 174 253(H) -   LDLCALC 0 - 99 mg/dL 169(H) - 87 185(H) -   LDLDIRECT mg/dL - 172.0 - - -   HDL >39.00 mg/dL 45 48.50 47.20 45.00 -   Trlycerides 0.0 - 149.0 mg/dL 165(H) 202.0(H) 195.0(H)  112.0 -   Hemoglobin A1c 4.6 - 6.5 % 5.9(H) - 6.2 - 6.1       Pulmonary Assessment Scores: Pulmonary Assessment Scores    Row Name 09/22/18 1231         ADL UCSD   SOB Score total  62     Rest  1     Walk  2     Stairs  4     Bath  4     Dress  2     Shop  2       CAT Score   CAT Score  14       mMRC Score   mMRC Score  1        UCSD: Self-administered rating of dyspnea associated with activities of daily living (ADLs) 6-point scale (0 = "not at all" to 5 = "maximal or unable to do because of breathlessness")  Scoring Scores range from 0 to 120.  Minimally important difference is 5 units  CAT: CAT can identify the health impairment of  COPD patients and is better correlated with disease progression.  CAT has a scoring range of zero to 40. The CAT score is classified into four groups of low (less than 10), medium (10 - 20), high (21-30) and very high (31-40) based on the impact level of disease on health status. A CAT score over 10 suggests significant symptoms.  A worsening CAT score could be explained by an exacerbation, poor medication adherence, poor inhaler technique, or progression of COPD or comorbid conditions.  CAT MCID is 2 points  mMRC: mMRC (Modified Medical Research Council) Dyspnea Scale is used to assess the degree of baseline functional disability in patients of respiratory disease due to dyspnea. No minimal important difference is established. A decrease in score of 1 point or greater is considered a positive change.   Pulmonary Function Assessment:   Exercise Target Goals: Exercise Program Goal: Individual exercise prescription set using results from initial 6 min walk test and THRR while considering  patient's activity barriers and safety.   Exercise Prescription Goal: Initial exercise prescription builds to 30-45 minutes a day of aerobic activity, 2-3 days per week.  Home exercise guidelines will be given to patient during program as part of exercise prescription that the participant will acknowledge.  Activity Barriers & Risk Stratification: Activity Barriers & Cardiac Risk Stratification - 09/21/18 0810      Activity Barriers & Cardiac Risk Stratification   Activity Barriers  Deconditioning;Muscular Weakness;Joint Problems;Other (comment)    Comments  Hx CVA,  r knee pain       6 Minute Walk: 6 Minute Walk    Row Name 09/22/18 1220         6 Minute Walk   Phase  Initial     Distance  1765 feet     Walk Time  6 minutes     # of Rest Breaks  0     MPH  3.34     METS  4.06     RPE  17     Perceived Dyspnea   3.5     VO2 Peak  14.21     Symptoms  No     Resting HR  78 bpm     Resting BP   102/58     Resting Oxygen Saturation   94 %     Exercise Oxygen Saturation  during 6 min walk  93 %     Max Ex.  HR  121 bpm     Max Ex. BP  138/58     2 Minute Post BP  122/58       Interval HR   1 Minute HR  105     2 Minute HR  113     3 Minute HR  116     4 Minute HR  112     5 Minute HR  110     6 Minute HR  121     2 Minute Post HR  97     Interval Heart Rate?  Yes       Interval Oxygen   Interval Oxygen?  Yes     Baseline Oxygen Saturation %  94 %     1 Minute Oxygen Saturation %  93 %     1 Minute Liters of Oxygen  0 L     2 Minute Oxygen Saturation %  94 %     2 Minute Liters of Oxygen  0 L     3 Minute Oxygen Saturation %  94 %     3 Minute Liters of Oxygen  0 L     4 Minute Oxygen Saturation %  94 %     4 Minute Liters of Oxygen  0 L     5 Minute Oxygen Saturation %  95 %     5 Minute Liters of Oxygen  0 L     6 Minute Oxygen Saturation %  95 %     6 Minute Liters of Oxygen  0 L     2 Minute Post Oxygen Saturation %  96 %     2 Minute Post Liters of Oxygen  0 L       Oxygen Initial Assessment: Oxygen Initial Assessment - 09/21/18 0810      Home Oxygen   Home Oxygen Device  None    Sleep Oxygen Prescription  None    Home Exercise Oxygen Prescription  None    Home at Rest Exercise Oxygen Prescription  None      Initial 6 min Walk   Oxygen Used  None      Program Oxygen Prescription   Program Oxygen Prescription  None      Intervention   Short Term Goals  To learn and demonstrate proper pursed lip breathing techniques or other breathing techniques.;To learn and understand importance of monitoring SPO2 with pulse oximeter and demonstrate accurate use of the pulse oximeter.;To learn and understand importance of maintaining oxygen saturations>88%;To learn and demonstrate proper use of respiratory medications    Long  Term Goals  Verbalizes importance of monitoring SPO2 with pulse oximeter and return demonstration;Exhibits proper breathing techniques, such as  pursed lip breathing or other method taught during program session;Demonstrates proper use of MDI's;Compliance with respiratory medication;Maintenance of O2 saturations>88%       Oxygen Re-Evaluation: Oxygen Re-Evaluation    Row Name 09/24/18 0932 10/18/18 0926           Program Oxygen Prescription   Program Oxygen Prescription  -  None        Home Oxygen   Home Oxygen Device  -  None      Sleep Oxygen Prescription  -  None      Home Exercise Oxygen Prescription  -  None      Home at Rest Exercise Oxygen Prescription  -  None        Goals/Expected Outcomes   Short Term  Goals  To learn and understand importance of monitoring SPO2 with pulse oximeter and demonstrate accurate use of the pulse oximeter.;To learn and understand importance of maintaining oxygen saturations>88%;To learn and demonstrate proper pursed lip breathing techniques or other breathing techniques.  To learn and understand importance of monitoring SPO2 with pulse oximeter and demonstrate accurate use of the pulse oximeter.;To learn and understand importance of maintaining oxygen saturations>88%;To learn and demonstrate proper pursed lip breathing techniques or other breathing techniques.;To learn and demonstrate proper use of respiratory medications      Long  Term Goals  Verbalizes importance of monitoring SPO2 with pulse oximeter and return demonstration;Maintenance of O2 saturations>88%;Exhibits proper breathing techniques, such as pursed lip breathing or other method taught during program session  Verbalizes importance of monitoring SPO2 with pulse oximeter and return demonstration;Maintenance of O2 saturations>88%;Exhibits proper breathing techniques, such as pursed lip breathing or other method taught during program session;Compliance with respiratory medication;Demonstrates proper use of MDI's      Comments  Reviewed PLB technique with pt.  Talked about how it work and it's important to maintaining his exercise  saturations.  He has really appreciated that difference that PLB has made for him.  Since using it more consistently, he has not needed to use his rescue inhaler.      Goals/Expected Outcomes  Short: Become more profiecient at using PLB.   Long: Become independent at using PLB.  Short: Continue to use PLB regularly.  Long: Continue to improve breathing.         Oxygen Discharge (Final Oxygen Re-Evaluation): Oxygen Re-Evaluation - 10/18/18 0926      Program Oxygen Prescription   Program Oxygen Prescription  None      Home Oxygen   Home Oxygen Device  None    Sleep Oxygen Prescription  None    Home Exercise Oxygen Prescription  None    Home at Rest Exercise Oxygen Prescription  None      Goals/Expected Outcomes   Short Term Goals  To learn and understand importance of monitoring SPO2 with pulse oximeter and demonstrate accurate use of the pulse oximeter.;To learn and understand importance of maintaining oxygen saturations>88%;To learn and demonstrate proper pursed lip breathing techniques or other breathing techniques.;To learn and demonstrate proper use of respiratory medications    Long  Term Goals  Verbalizes importance of monitoring SPO2 with pulse oximeter and return demonstration;Maintenance of O2 saturations>88%;Exhibits proper breathing techniques, such as pursed lip breathing or other method taught during program session;Compliance with respiratory medication;Demonstrates proper use of MDI's    Comments  He has really appreciated that difference that PLB has made for him.  Since using it more consistently, he has not needed to use his rescue inhaler.    Goals/Expected Outcomes  Short: Continue to use PLB regularly.  Long: Continue to improve breathing.       Initial Exercise Prescription: Initial Exercise Prescription - 09/22/18 1200      Date of Initial Exercise RX and Referring Provider   Date  09/22/18    Referring Provider  Sonnenberg      Treadmill   MPH  3.3    Grade   1    Minutes  15    METs  4      Elliptical   Level  1    Speed  3    Minutes  15      REL-XR   Level  3    Speed  50    Minutes  15    METs  4      T5 Nustep   Level  3    SPM  80    Minutes  15    METs  4      Prescription Details   Frequency (times per week)  3    Duration  Progress to 30 minutes of continuous aerobic without signs/symptoms of physical distress      Intensity   THRR 40-80% of Max Heartrate  107-136    Ratings of Perceived Exertion  11-15    Perceived Dyspnea  0-4      Progression   Progression  Continue to progress workloads to maintain intensity without signs/symptoms of physical distress.      Resistance Training   Training Prescription  Yes    Weight  5 lb    Reps  10-15       Perform Capillary Blood Glucose checks as needed.  Exercise Prescription Changes: Exercise Prescription Changes    Row Name 09/22/18 1200 10/05/18 1400 10/18/18 0900 11/16/18 1400       Response to Exercise   Blood Pressure (Admit)  102/58  110/70  -  128/60    Blood Pressure (Exercise)  138/58  146/80  -  140/60    Blood Pressure (Exit)  122/58  126/64  -  124/72    Heart Rate (Admit)  78 bpm  80 bpm  -  77 bpm    Heart Rate (Exercise)  121 bpm  97 bpm  -  104 bpm    Heart Rate (Exit)  97 bpm  84 bpm  -  98 bpm    Oxygen Saturation (Admit)  94 %  95 %  -  97 %    Oxygen Saturation (Exercise)  93 %  94 %  -  95 %    Oxygen Saturation (Exit)  96 %  96 %  -  95 %    Rating of Perceived Exertion (Exercise)  17  12  -  13    Perceived Dyspnea (Exercise)  3.5  3  -  3    Symptoms  none  none  -  none    Duration  -  Continue with 30 min of aerobic exercise without signs/symptoms of physical distress.  -  Continue with 30 min of aerobic exercise without signs/symptoms of physical distress.    Intensity  -  THRR unchanged  -  THRR unchanged      Progression   Progression  -  Continue to progress workloads to maintain intensity without signs/symptoms of physical  distress.  -  Continue to progress workloads to maintain intensity without signs/symptoms of physical distress.    Average METs  -  2.91  -  3.42      Resistance Training   Training Prescription  -  Yes  -  Yes    Weight  -  5 lbs  -  5 lbs    Reps  -  10-15  -  10-15      Interval Training   Interval Training  -  No  -  No      Treadmill   MPH  -  2.8  -  3.2    Grade  -  1  -  0    Minutes  -  15  -  15    METs  -  3.53  -  3.45      Elliptical  Level  -  1  -  6    Speed  -  2.5  -  3.5    Minutes  -  15  -  15      REL-XR   Level  -  4  -  9    Minutes  -  15  -  15    METs  -  3.1  -  4.3      T5 Nustep   Level  -  3  -  8    Minutes  -  15  -  15    METs  -  3.1  -  2.5      Home Exercise Plan   Plans to continue exercise at  -  -  Home (comment) walking  Home (comment) walking    Frequency  -  -  Add 2 additional days to program exercise sessions.  Add 2 additional days to program exercise sessions.    Initial Home Exercises Provided  -  -  10/07/18  10/07/18       Exercise Comments: Exercise Comments    Row Name 09/24/18 0931           Exercise Comments  First full day of exercise!  Patient was oriented to gym and equipment including functions, settings, policies, and procedures.  Patient's individual exercise prescription and treatment plan were reviewed.  All starting workloads were established based on the results of the 6 minute walk test done at initial orientation visit.  The plan for exercise progression was also introduced and progression will be customized based on patient's performance and goals.          Exercise Goals and Review: Exercise Goals    Row Name 09/22/18 1227             Exercise Goals   Increase Physical Activity  Yes       Intervention  Provide advice, education, support and counseling about physical activity/exercise needs.;Develop an individualized exercise prescription for aerobic and resistive training based on initial  evaluation findings, risk stratification, comorbidities and participant's personal goals.       Expected Outcomes  Short Term: Attend rehab on a regular basis to increase amount of physical activity.;Long Term: Add in home exercise to make exercise part of routine and to increase amount of physical activity.;Long Term: Exercising regularly at least 3-5 days a week.       Increase Strength and Stamina  Yes       Intervention  Provide advice, education, support and counseling about physical activity/exercise needs.;Develop an individualized exercise prescription for aerobic and resistive training based on initial evaluation findings, risk stratification, comorbidities and participant's personal goals.       Expected Outcomes  Short Term: Increase workloads from initial exercise prescription for resistance, speed, and METs.;Short Term: Perform resistance training exercises routinely during rehab and add in resistance training at home;Long Term: Improve cardiorespiratory fitness, muscular endurance and strength as measured by increased METs and functional capacity (6MWT)       Able to understand and use rate of perceived exertion (RPE) scale  Yes       Intervention  Provide education and explanation on how to use RPE scale       Expected Outcomes  Short Term: Able to use RPE daily in rehab to express subjective intensity level;Long Term:  Able to use RPE to guide intensity level when exercising independently  Able to understand and use Dyspnea scale  Yes       Intervention  Provide education and explanation on how to use Dyspnea scale       Expected Outcomes  Short Term: Able to use Dyspnea scale daily in rehab to express subjective sense of shortness of breath during exertion;Long Term: Able to use Dyspnea scale to guide intensity level when exercising independently       Knowledge and understanding of Target Heart Rate Range (THRR)  Yes       Intervention  Provide education and explanation of THRR  including how the numbers were predicted and where they are located for reference       Expected Outcomes  Short Term: Able to state/look up THRR;Short Term: Able to use daily as guideline for intensity in rehab;Long Term: Able to use THRR to govern intensity when exercising independently       Able to check pulse independently  Yes       Intervention  Provide education and demonstration on how to check pulse in carotid and radial arteries.;Review the importance of being able to check your own pulse for safety during independent exercise       Expected Outcomes  Short Term: Able to explain why pulse checking is important during independent exercise;Long Term: Able to check pulse independently and accurately       Understanding of Exercise Prescription  Yes       Intervention  Provide education, explanation, and written materials on patient's individual exercise prescription       Expected Outcomes  Short Term: Able to explain program exercise prescription;Long Term: Able to explain home exercise prescription to exercise independently          Exercise Goals Re-Evaluation : Exercise Goals Re-Evaluation    Row Name 09/24/18 0931 09/27/18 0933 10/05/18 1435 10/18/18 0921 11/16/18 1452     Exercise Goal Re-Evaluation   Exercise Goals Review  Increase Physical Activity;Increase Strength and Stamina;Able to understand and use rate of perceived exertion (RPE) scale;Knowledge and understanding of Target Heart Rate Range (THRR);Able to understand and use Dyspnea scale;Understanding of Exercise Prescription  Increase Physical Activity;Increase Strength and Stamina;Able to understand and use rate of perceived exertion (RPE) scale;Knowledge and understanding of Target Heart Rate Range (THRR);Able to understand and use Dyspnea scale;Able to check pulse independently;Understanding of Exercise Prescription  Increase Physical Activity;Increase Strength and Stamina;Understanding of Exercise Prescription  Increase  Physical Activity;Increase Strength and Stamina;Understanding of Exercise Prescription  Increase Physical Activity;Increase Strength and Stamina;Understanding of Exercise Prescription   Comments  Reviewed RPE scale, THR and program prescription with pt today.  Pt voiced understanding and was given a copy of goals to take home.  -  Alexander Barton is off to a good start in rehab.  He is already up to level 4 on the XR.  He is pleased to be back in the program to build his strength for surgery. We will continue to monitor his progres.  Alexander Barton is doing well in rehab.  He finds the standing exercise to be harder than the seated.  It is getting easier somewhat.  He is slowly regaining his strength.  He is walking at home on is off days.  Alexander Barton had been doing well in rehab.  He has tested postive for COVID-19 and will be out for 2 weeks.  He had worked his way up to level 9 on the XR.  We will continue to monitor his progress.   Expected Outcomes  Short: Use RPE daily to regulate intensity. Long: Follow program prescription in THR.  -  Short: Review home exericse guidelines. Long: Continue to follow program prescription.  Short: Continue to attend regulalry.  Long:Continue to build strength for surgery.  Short: Recover from [facility] and get cleared to return to rehab.  Long: Continue to rebuild stamina.      Discharge Exercise Prescription (Final Exercise Prescription Changes): Exercise Prescription Changes - 11/16/18 1400      Response to Exercise   Blood Pressure (Admit)  128/60    Blood Pressure (Exercise)  140/60    Blood Pressure (Exit)  124/72    Heart Rate (Admit)  77 bpm    Heart Rate (Exercise)  104 bpm    Heart Rate (Exit)  98 bpm    Oxygen Saturation (Admit)  97 %    Oxygen Saturation (Exercise)  95 %    Oxygen Saturation (Exit)  95 %    Rating of Perceived Exertion (Exercise)  13    Perceived Dyspnea (Exercise)  3    Symptoms  none    Duration  Continue with 30 min of aerobic exercise without  signs/symptoms of physical distress.    Intensity  THRR unchanged      Progression   Progression  Continue to progress workloads to maintain intensity without signs/symptoms of physical distress.    Average METs  3.42      Resistance Training   Training Prescription  Yes    Weight  5 lbs    Reps  10-15      Interval Training   Interval Training  No      Treadmill   MPH  3.2    Grade  0    Minutes  15    METs  3.45      Elliptical   Level  6    Speed  3.5    Minutes  15      REL-XR   Level  9    Minutes  15    METs  4.3      T5 Nustep   Level  8    Minutes  15    METs  2.5      Home Exercise Plan   Plans to continue exercise at  Home (comment)   walking   Frequency  Add 2 additional days to program exercise sessions.    Initial Home Exercises Provided  10/07/18       Nutrition:  Target Goals: Understanding of nutrition guidelines, daily intake of sodium <1559m, cholesterol <2025m calories 30% from fat and 7% or less from saturated fats, daily to have 5 or more servings of fruits and vegetables.  Biometrics: Pre Biometrics - 09/22/18 1228      Pre Biometrics   Height  6' 1.25" (1.861 m)    Weight  216 lb 14.4 oz (98.4 kg)    BMI (Calculated)  28.41        Nutrition Therapy Plan and Nutrition Goals: Nutrition Therapy & Goals - 09/22/18 1133      Nutrition Therapy   Drug/Food Interactions  Coumadin/Vit K    Protein (specify units)  80g    Fiber  30 grams    Whole Grain Foods  3 servings    Saturated Fats  12 max. grams    Fruits and Vegetables  5 servings/day    Sodium  1.5 grams      Personal Nutrition Goals   Nutrition Goal  ST: include  one plant based protein snack; to increase fiber and protein LT: lose ~10 lbs, get stronger for surgery    Comments  Pt reports eating 5 health bars for weight loss (12g pro, 100kcal each) with s lean protein and vegetable for the day - discussed how vit/min from whole foods was optimal and that was insuffient  kcals. Gave weight expectations and how his pro/kcal needs are higher due to his COPD. Pt reports lactose intolerance and only has dairy on occasion. Pt reports baking/grilling fish or chicken. Pt reports eating limited "junk foods" and even processed foods. In am will have coffee, sausage, eggs, and sometimes grits. Pt will not usually have lunch "not eating doesn't bother me", but will sometimes have a protein shake which is high in prootein but fairly low in calories (told pt that could make a good snack. Pt reports that he needs medication and the protein shake to stay regular, discussed fiber needs. Discussed HH eating. Pt reports that lasix is prescribed but has not been used (was prescribed over telehealth just in case).      Intervention Plan   Intervention  Prescribe, educate and counsel regarding individualized specific dietary modifications aiming towards targeted core components such as weight, hypertension, lipid management, diabetes, heart failure and other comorbidities.;Nutrition handout(s) given to patient.    Expected Outcomes  Short Term Goal: Understand basic principles of dietary content, such as calories, fat, sodium, cholesterol and nutrients.;Short Term Goal: A plan has been developed with personal nutrition goals set during dietitian appointment.;Long Term Goal: Adherence to prescribed nutrition plan.       Nutrition Assessments: Nutrition Assessments - 09/22/18 1144      MEDFICTS Scores   Pre Score  26       Nutrition Goals Re-Evaluation:   Nutrition Goals Discharge (Final Nutrition Goals Re-Evaluation):   Psychosocial: Target Goals: Acknowledge presence or absence of significant depression and/or stress, maximize coping skills, provide positive support system. Participant is able to verbalize types and ability to use techniques and skills needed for reducing stress and depression.   Initial Review & Psychosocial Screening: Initial Psych Review & Screening -  09/21/18 0814      Initial Review   Current issues with  History of Depression;Current Sleep Concerns   He sleeps about 5-6 hours a night, uses CBD oil to help, gets up two to three times to use bathroom and is often able to get back to sleep     Family Dynamics   Good Support System?  Yes   Wife [name]), daughter  (Amy), son Corene Cornea), 3 grandson, and good relationships wiht in-law   Comments  Attends a great church but has not been since [place], will wait til after surgery      Barriers   Psychosocial barriers to participate in program  There are no identifiable barriers or psychosocial needs.;The patient should benefit from training in stress management and relaxation.      Screening Interventions   Interventions  Encouraged to exercise;To provide support and resources with identified psychosocial needs    Expected Outcomes  Long Term Goal: Stressors or current issues are controlled or eliminated.;Short Term goal: Utilizing psychosocial counselor, staff and physician to assist with identification of specific Stressors or current issues interfering with healing process. Setting desired goal for each stressor or current issue identified.;Short Term goal: Identification and review with participant of any Quality of Life or Depression concerns found by scoring the questionnaire.;Long Term goal: The participant improves quality of Life and  PHQ9 Scores as seen by post scores and/or verbalization of changes       Quality of Life Scores:  Scores of 19 and below usually indicate a poorer quality of life in these areas.  A difference of  2-3 points is a clinically meaningful difference.  A difference of 2-3 points in the total score of the Quality of Life Index has been associated with significant improvement in overall quality of life, self-image, physical symptoms, and general health in studies assessing change in quality of life.  PHQ-9: Recent Review Flowsheet Data    Depression screen [facility] 2/9  10/18/2018 09/22/2018 09/10/2018 07/14/2018 01/04/2018   Decreased Interest 0 1 0 0 0   Down, Depressed, Hopeless 0 1 0 0 0   PHQ - 2 Score 0 2 0 0 0   Altered sleeping 1 2 - 0 -   Tired, decreased energy 2 2 - 0 -   Change in appetite 2 0 - 0 -   Feeling bad or failure about yourself  0 0 - 0 -   Trouble concentrating 2 1 - 0 -   Moving slowly or fidgety/restless 1 0 - 0 -   Suicidal thoughts 0 0 - 0 -   PHQ-9 Score 8 7 - 0 -   Difficult doing work/chores Not difficult at all Not difficult at all - Not difficult at all -     Interpretation of Total Score  Total Score Depression Severity:  1-4 = Minimal depression, 5-9 = Mild depression, 10-14 = Moderate depression, 15-19 = Moderately severe depression, 20-27 = Severe depression   Psychosocial Evaluation and Intervention:   Psychosocial Re-Evaluation: Psychosocial Re-Evaluation    [place] Name 10/18/18 0923             Psychosocial Re-Evaluation   Current issues with  History of Depression;Current Sleep Concerns;Current Stress Concerns       Comments  Alexander Barton is doing well in rehab.  He is preparing for a AAA repair surgery and his doctor wanted him to build up his strength first.  He has not had any depression symptoms.  He is sleeping better now and not taking anything for it.  His PHQ score went up slightly, but more related to energy and sleep.  He is feeling better overall.       Expected Outcomes  Short: Continue to improve Long: Continue to feel better and stay positive.          Psychosocial Discharge (Final Psychosocial Re-Evaluation): Psychosocial Re-Evaluation - 10/18/18 0923      Psychosocial Re-Evaluation   Current issues with  History of Depression;Current Sleep Concerns;Current Stress Concerns    Comments  Alexander Barton is doing well in rehab.  He is preparing for a AAA repair surgery and his doctor wanted him to build up his strength first.  He has not had any depression symptoms.  He is sleeping better now and not taking anything  for it.  His PHQ score went up slightly, but more related to energy and sleep.  He is feeling better overall.    Expected Outcomes  Short: Continue to improve Long: Continue to feel better and stay positive.       Education: Education Goals: Education classes will be provided on a weekly basis, covering required topics. Participant will state understanding/return demonstration of topics presented.  Learning Barriers/Preferences: Learning Barriers/Preferences - 09/21/18 0813      Learning Barriers/Preferences   Learning Barriers  Sight   glasses   Learning  Preferences  None       Education Topics:  Initial Evaluation Education: - Verbal, written and demonstration of respiratory meds, oximetry and breathing techniques. Instruction on use of nebulizers and MDIs and importance of monitoring MDI activations.   Pulmonary Rehab from 11/24/2016 in [facility] Cardiac and Pulmonary Rehab  Date  08/12/16  Educator  SB  Instruction Review Code (retired)  2- meets goals/outcomes      General Nutrition Guidelines/Fats and Fiber: -Group instruction provided by verbal, written material, models and posters to present the general guidelines for heart healthy nutrition. Gives an explanation and review of dietary fats and fiber.   Pulmonary Rehab from 11/24/2016 in [facility] Cardiac and Pulmonary Rehab  Date  11/17/16  Educator  CR  Instruction Review Code  1- Verbalizes Understanding      Controlling Sodium/Reading Food Labels: -Group verbal and written material supporting the discussion of sodium use in heart healthy nutrition. Review and explanation with models, verbal and written materials for utilization of the food label.   Pulmonary Rehab from 11/24/2016 in [facility] Cardiac and Pulmonary Rehab  Date  11/24/16  Educator  CR  Instruction Review Code  5- Refused Teaching      Exercise Physiology & General Exercise Guidelines: - Group verbal and written instruction with models to review the exercise  physiology of the cardiovascular system and associated critical values. Provides general exercise guidelines with specific guidelines to those with heart or lung disease.    Pulmonary Rehab from 11/24/2016 in [facility] Cardiac and Pulmonary Rehab  Date  11/07/16  Educator  Nada Maclachlan, EP  Instruction Review Code  2- Demonstrated Understanding      Aerobic Exercise & Resistance Training: - Gives group verbal and written instruction on the various components of exercise. Focuses on aerobic and resistive training programs and the benefits of this training and how to safely progress through these programs.   Pulmonary Rehab from 11/24/2016 in [facility] Cardiac and Pulmonary Rehab  Date  08/29/16  Educator  [facility]  Instruction Review Code  2- Demonstrated Understanding      Flexibility, Balance, Mind/Body Relaxation: Provides group verbal/written instruction on the benefits of flexibility and balance training, including mind/body exercise modes such as yoga, pilates and tai chi.  Demonstration and skill practice provided.   Pulmonary Rehab from 11/24/2016 in [facility] Cardiac and Pulmonary Rehab  Date  09/17/16  Educator  [facility]  Instruction Review Code  2- Demonstrated Understanding      Stress and Anxiety: - Provides group verbal and written instruction about the health risks of elevated stress and causes of high stress.  Discuss the correlation between heart/lung disease and anxiety and treatment options. Review healthy ways to manage with stress and anxiety.   Pulmonary Rehab from 11/24/2016 in [facility] Cardiac and Pulmonary Rehab  Date  10/22/16  Educator  [facility]  Instruction Review Code  1- Verbalizes Understanding      Depression: - Provides group verbal and written instruction on the correlation between heart/lung disease and depressed mood, treatment options, and the stigmas associated with seeking treatment.   Pulmonary Rehab from 11/24/2016 in [facility] Cardiac and Pulmonary Rehab  Date  09/24/16   Educator  [facility]  Instruction Review Code (retired)  2- meets goals/outcomes      Exercise & Equipment Safety: - Individual verbal instruction and demonstration of equipment use and safety with use of the equipment.   Cardiac Rehab from 09/22/2018 in [facility] Cardiac and Pulmonary Rehab  Date  09/22/18  Educator  AS  Instruction Review Code  1- Verbalizes Understanding      Infection Prevention: - Provides verbal and written material to individual with discussion of infection control including proper hand washing and proper equipment cleaning during exercise session.   Cardiac Rehab from 09/22/2018 in [facility] Cardiac and Pulmonary Rehab  Date  09/22/18  Educator  AS  Instruction Review Code  1- Verbalizes Understanding      Falls Prevention: - Provides verbal and written material to individual with discussion of falls prevention and safety.   Cardiac Rehab from 09/22/2018 in [facility] Cardiac and Pulmonary Rehab  Date  09/22/18  Educator  AS  Instruction Review Code  1- Verbalizes Understanding      Diabetes: - Individual verbal and written instruction to review signs/symptoms of diabetes, desired ranges of glucose level fasting, after meals and with exercise. Advice that pre and post exercise glucose checks will be done for 3 sessions at entry of program.   Chronic Lung Diseases: - Group verbal and written instruction to review updates, respiratory medications, advancements in procedures and treatments. Discuss use of supplemental oxygen including available portable oxygen systems, continuous and intermittent flow rates, concentrators, personal use and safety guidelines. Review proper use of inhaler and spacers. Provide informative websites for self-education.    Pulmonary Rehab from 11/24/2016 in [facility] Cardiac and Pulmonary Rehab  Date  11/05/16  Educator  [facility]  Instruction Review Code  1- Verbalizes Understanding      Energy Conservation: - Provide group verbal and written instruction  for methods to conserve energy, plan and organize activities. Instruct on pacing techniques, use of adaptive equipment and posture/positioning to relieve shortness of breath.   Pulmonary Rehab from 11/24/2016 in [facility] Cardiac and Pulmonary Rehab  Date  10/15/16  Educator  [facility]  Instruction Review Code  1- Verbalizes Understanding      Triggers and Exacerbations: - Group verbal and written instruction to review types of environmental triggers and ways to prevent exacerbations. Discuss weather changes, air quality and the benefits of nasal washing. Review warning signs and symptoms to help prevent infections. Discuss techniques for effective airway clearance, coughing, and vibrations.   AED/CPR: - Group verbal and written instruction with the use of models to demonstrate the basic use of the AED with the basic ABC's of resuscitation.   Anatomy and Physiology of the Lungs: - Group verbal and written instruction with the use of models to provide basic lung anatomy and physiology related to function, structure and complications of lung disease.   Pulmonary Rehab from 11/24/2016 in [facility] Cardiac and Pulmonary Rehab  Date  10/08/16  Educator  [facility]  Instruction Review Code  1- Verbalizes Understanding      Anatomy & Physiology of the Heart: - Group verbal and written instruction and models provide basic cardiac anatomy and physiology, with the coronary electrical and arterial systems. Review of Valvular disease and Heart Failure   Cardiac Medications: - Group verbal and written instruction to review commonly prescribed medications for heart disease. Reviews the medication, class of the drug, and side effects.   Pulmonary Rehab from 11/24/2016 in [facility] Cardiac and Pulmonary Rehab  Date  10/10/16  Educator  C. EnterkinRN  Instruction Review Code  1- Verbalizes Understanding      Know Your Numbers and Risk Factors: -Group verbal and written instruction about important numbers in your health.   Discussion of what are risk factors and how they play a role in the disease process.  Review of Cholesterol, Blood  Pressure, Diabetes, and BMI and the role they play in your overall health.   Sleep Hygiene: -Provides group verbal and written instruction about how sleep can affect your health.  Define sleep hygiene, discuss sleep cycles and impact of sleep habits. Review good sleep hygiene tips.    Other: -Provides group and verbal instruction on various topics (see comments)    Knowledge Questionnaire Score: Knowledge Questionnaire Score - 09/22/18 1235      Knowledge Questionnaire Score   Pre Score  15/18        Core Components/Risk Factors/Patient Goals at Admission: Personal Goals and Risk Factors at Admission - 09/22/18 1235      Core Components/Risk Factors/Patient Goals on Admission    Weight Management  Weight Loss;Yes    Intervention  Weight Management: Develop a combined nutrition and exercise program designed to reach desired caloric intake, while maintaining appropriate intake of nutrient and fiber, sodium and fats, and appropriate energy expenditure required for the weight goal.;Weight Management: Provide education and appropriate resources to help participant work on and attain dietary goals.    Admit Weight  216 lb 14.4 oz (98.4 kg)    Goal Weight: Short Term  219 lb (99.3 kg)    Goal Weight: Long Term  209 lb (94.8 kg)    Expected Outcomes  Short Term: Continue to assess and modify interventions until short term weight is achieved;Long Term: Adherence to nutrition and physical activity/exercise program aimed toward attainment of established weight goal;Weight Loss: Understanding of general recommendations for a balanced deficit meal plan, which promotes 1-2 lb weight loss per week and includes a negative energy balance of [phone] kcal/d;Understanding recommendations for meals to include 15-35% energy as protein, 25-35% energy from fat, 35-60% energy from carbohydrates,  less than 245m of dietary cholesterol, 20-35 gm of total fiber daily;Understanding of distribution of calorie intake throughout the day with the consumption of 4-5 meals/snacks    Improve shortness of breath with ADL's  Yes    Intervention  Provide education, individualized exercise plan and daily activity instruction to help decrease symptoms of SOB with activities of daily living.    Expected Outcomes  Short Term: Improve cardiorespiratory fitness to achieve a reduction of symptoms when performing ADLs;Long Term: Be able to perform more ADLs without symptoms or delay the onset of symptoms    Intervention  Provide education on lifestyle modifcations including regular physical activity/exercise, weight management, moderate sodium restriction and increased consumption of fresh fruit, vegetables, and low fat dairy, alcohol moderation, and smoking cessation.;Monitor prescription use compliance.    Expected Outcomes  Short Term: Continued assessment and intervention until BP is < 140/914mHG in hypertensive participants. < 130/8053mG in hypertensive participants with diabetes, heart failure or chronic kidney disease.;Long Term: Maintenance of blood pressure at goal levels.    Intervention  Provide education and support for participant on nutrition & aerobic/resistive exercise along with prescribed medications to achieve LDL <1m39mDL >40mg8m Expected Outcomes  Short Term: Participant states understanding of desired cholesterol values and is compliant with medications prescribed. Participant is following exercise prescription and nutrition guidelines.;Long Term: Cholesterol controlled with medications as prescribed, with individualized exercise RX and with personalized nutrition plan. Value goals: LDL < 1mg,69m > 40 mg.       Core Components/Risk Factors/Patient Goals Review:  Goals and Risk Factor Review    [place] Name 10/18/18 0924             Core Components/Risk Factors/Patient Goals Review  Personal Goals Review  Weight Management/Obesity;Improve shortness of breath with ADL's;Hypertension;Lipids       Review  Alexander Barton's weight has plateaued.  He has had house guest and entertaining, now that they are gone, he is hoping to get back on track with his weight loss.  He feels that his breathing is improving.  He checks his pressures daily at home and they are doing well.  He is doing well on his medications.       Expected Outcomes  Short: Get back to eating better for weight loss.  Long: Continue to improve breathing.          Core Components/Risk Factors/Patient Goals at Discharge (Final Review):  Goals and Risk Factor Review - 10/18/18 0924      Core Components/Risk Factors/Patient Goals Review   Personal Goals Review  Weight Management/Obesity;Improve shortness of breath with ADL's;Hypertension;Lipids    Review  Alexander Barton's weight has plateaued.  He has had house guest and entertaining, now that they are gone, he is hoping to get back on track with his weight loss.  He feels that his breathing is improving.  He checks his pressures daily at home and they are doing well.  He is doing well on his medications.    Expected Outcomes  Short: Get back to eating better for weight loss.  Long: Continue to improve breathing.       ITP Comments: ITP Comments    Row Name 09/21/18 0824 09/21/18 0832 09/22/18 1218 10/13/18 0721 11/01/18 0943   ITP Comments  Currently on study with [facility] clinic looking at sodium versus statins.  Not sure what he is taking, but numbers seem to be good.  Virtual orientation visit completed.  Documentation for diagnosis can be found in [facility] encounter 8/7.  Completed 6MWT and nutrition eval Initial ITP created and sent to Dr Sabra Heck.  30 Day Review Completed today. Continue with ITP unless changed by Medical Director review.  Alexander Barton did report extra shortness of breath this morning while getting ready for the day. His weight was the same and vital signs were within acceptable  ranges. Exercise was tolerated but with a little extra shortness of breath. Alexander Barton was advised to call his doctor and report this change in symptoms.   [place] Name 11/10/18 1107 11/16/18 1452 12/08/18 0716 12/15/18 1404 12/27/18 1450   ITP Comments  30 day review completed. ITP sent to Dr. Emily Filbert, Medical Director of Cardiac and Pulmonary Rehab. Continue with ITP unless changes are made by physician.  Department closed starting 10/2 until further notice by infection prevention and Health at Work teams for [place].  Alexander Barton has tested postive for COVID-19 on 10/7.  He will be out for 14 days.  30 day review completed. Continue with ITP sent to Dr. Emily Filbert, Medical Director of Cardiac and Pulmonary Rehab for review , changes as needed and signature.  Out for medical reason Alexander Barton is scheduled For surgery on an aneurysm and surgeon wants Alexander Barton to hold on program until after surgery  Alexander Barton had his surgery in two phases.  Phase one was 10/29 and phase two 11/5.  He was discharged home on 11/9.  We will continue to follow for clearance to return to rehab.  Alexander Barton is back in hospital with incisional drainage. He does have a note to return to rehab. We will continue to follow.   [place] Name 01/05/19 1102           ITP Comments  30 day review competed .  ITP sent to Dr Emily Filbert for review, changes as needed and ITP approval signature.          Comments:

## 2019-01-06 ENCOUNTER — Ambulatory Visit (INDEPENDENT_AMBULATORY_CARE_PROVIDER_SITE_OTHER): Payer: Medicare Other | Admitting: Cardiology

## 2019-01-06 ENCOUNTER — Telehealth: Payer: Medicare Other | Admitting: Cardiology

## 2019-01-06 ENCOUNTER — Encounter: Payer: Self-pay | Admitting: Cardiology

## 2019-01-06 ENCOUNTER — Telehealth: Payer: Self-pay | Admitting: Family Medicine

## 2019-01-06 ENCOUNTER — Other Ambulatory Visit: Payer: Self-pay

## 2019-01-06 VITALS — BP 110/66 | HR 99 | Temp 96.6°F | Ht 73.0 in | Wt 199.0 lb

## 2019-01-06 DIAGNOSIS — Z952 Presence of prosthetic heart valve: Secondary | ICD-10-CM

## 2019-01-06 DIAGNOSIS — I429 Cardiomyopathy, unspecified: Secondary | ICD-10-CM | POA: Diagnosis not present

## 2019-01-06 DIAGNOSIS — Z7189 Other specified counseling: Secondary | ICD-10-CM | POA: Diagnosis not present

## 2019-01-06 DIAGNOSIS — D649 Anemia, unspecified: Secondary | ICD-10-CM | POA: Diagnosis not present

## 2019-01-06 NOTE — Telephone Encounter (Signed)
Pt's wife dropped off a handicapp placard to be filled out by Dr. [name]. Form is up front in color folder.

## 2019-01-06 NOTE — Patient Instructions (Signed)
Medication Instructions:  Your physician recommends that you continue on your current medications as directed. Please refer to the Current Medication list given to you today.  *If you need a refill on your cardiac medications before your next appointment, please call your pharmacy*  Lab Work: Your physician recommends that you return for lab work on Monday January 10, 2019:   COMPREHENSIVE METABOLIC PANEL    COMPLETE BLOOD COUNT    THYROID STIMULATING HORMONE     If you have labs (blood work) drawn today and your tests are completely normal, you will receive your results only by: Marland Kitchen MyChart Message (if you have MyChart) OR . A paper copy in the mail If you have any lab test that is abnormal or we need to change your treatment, we will call you to review the results.  Testing/Procedures: NONE  Follow-Up: At [facility], you and your health needs are our priority.  As part of our continuing mission to provide you with exceptional heart care, we have created designated Provider Care Teams.  These Care Teams include your primary Cardiologist (physician) and Advanced Practice Providers (APPs -  Physician Assistants and Nurse Practitioners) who all work together to provide you with the care you need, when you need it.  Your next appointment:   01/10/2019 The format for your next appointment:   In Person  Provider:   [name], MD

## 2019-01-07 ENCOUNTER — Encounter: Payer: Self-pay | Admitting: Cardiology

## 2019-01-07 ENCOUNTER — Ambulatory Visit (INDEPENDENT_AMBULATORY_CARE_PROVIDER_SITE_OTHER): Payer: Medicare Other | Admitting: Pharmacist

## 2019-01-07 DIAGNOSIS — Z7901 Long term (current) use of anticoagulants: Secondary | ICD-10-CM | POA: Diagnosis not present

## 2019-01-07 DIAGNOSIS — I359 Nonrheumatic aortic valve disorder, unspecified: Secondary | ICD-10-CM

## 2019-01-07 LAB — POCT INR: INR: 3.3 — AB (ref 2.0–3.0)

## 2019-01-07 NOTE — Progress Notes (Signed)
Franciscojavier Casaus Eppolito Date of Birth: Feb 17, 1950 Medical Record [id number]  History of Present Illness: Mr. Pensinger is seen today for followup of valvular heart disease. He is status post mechanical aortic valve replacement in October 2008 with a #23 mm ON-X mechanical valve conduit with a button Bentall procedure and Hemi arch graft  and is on chronic Coumadin. He had a TIA in January 2012 and was admitted in May 2014 with a right thalamic CVA. He is now on aspirin and Coumadin.  His evaluation in the hospital included an echocardiogram which showed normal valve function. Carotid Dopplers were OK.  He was admitted in May 2018 at [facility] with worsening dyspnea. Seen by our service there. CTA of the chest demonstrated no evidence of PE or significant parenchymal lung disease. There is evidence of prior mechanical AVR and hemi-arch replacement. Coronary artery calcification is also noted as was cholelithiasis.  Myoview study was done and was normal. Echo also done as noted below.   He was admitted in September 2019 at [facility] with acute epigastric, chest, and back pain. Found to have a type B aortic dissection. Managed medically and followed by Dr. Mart Piggs at [facility].   He had  cardiac evaluation including cardiac cath that showed a 60% stenosis in an OM branch otherwise no obstructive disease. Echo showed normally functioning AV prosthesis and normal LV function. PFTs done with COPD.   On 12/02/18 he underwent redo sternotomy with Dr Ysidro Evert. He had 1. Ascending Aortic and total arch replacement with head vessel re-implantation.2. TEVAR w/ intentional L subclavian artery coverage, L carotid-subclavian bypass, and proximal L subclavian artery vascular plug. The postoperative course was remarkable for postop afib/flutter managed with amiodarone and beta blocker- NSR at time of discharge. Also developed postop delirium after TEVAR requiring psych consult. The patient was discharged to home on  12/13/2018. He was readmitted 11/16-11/20 for UTI, meeting sepsis criteria. He was treated with IV abx and transitioned to PO Cefpodoxime. Amiodarone was d/c'd prior to discharge from readmission.  He was seen by Dr Percival Spanish on 01/06/19 for evaluation of weakness. INR was noted to be high and Coumadin was held for 2 days. He was anemic but really unchanged from his post hospital discharge. It was noted that he had Covid 19 infection in early October and never felt fully recovered from that. He did have a follow up CT chest on November 24 that was stable.   On follow up today he states he still feels very weak. He is not resting well. States his mind is racing. Has some intermittent HA. Appetite is a little better. Minimal swelling in his feet. No chest pain or SOB. Planning to start PT this week.  Current Outpatient Medications on File Prior to Visit  Medication Sig Dispense Refill  . albuterol (VENTOLIN HFA) 108 (90 Base) MCG/ACT inhaler Inhale 2 puffs into the lungs every 4 (four) hours as needed for wheezing or shortness of breath. 18 g 1  . aspirin 81 MG tablet Take 81 mg by mouth at bedtime.     . benzonatate (TESSALON) 100 MG capsule Take 1 or 2 capsules every 8 hours as needed for cough 30 capsule 1  . budesonide-formoterol (SYMBICORT) 160-4.5 MCG/ACT inhaler TAKE 2 PUFFS BY MOUTH TWICE A DAY 30.6 Inhaler 4  . furosemide (LASIX) 20 MG tablet TAKE 1/2 TABLET (10 MG) 1 OR 2 TIMES A DAY AS NEEDED FOR LEG SWELLING 90 tablet 1  . metoprolol succinate (TOPROL-XL) 50 MG 24  hr tablet TAKE 1 TABLET (50 MG TOTAL) BY MOUTH DAILY. TAKE WITH OR IMMEDIATELY FOLLOWING A MEAL. 90 tablet 1  . NON FORMULARY     . Polyethylene Glycol 3350 (MIRALAX PO) Take by mouth.    . senna-docusate (SENOKOT-S) 8.6-50 MG tablet Take by mouth.    . tamsulosin (FLOMAX) 0.4 MG CAPS capsule TAKE 2 CAPSULES BY MOUTH EVERY DAY 174 capsule 1  . Tiotropium Bromide Monohydrate (SPIRIVA RESPIMAT) 2.5 MCG/ACT AERS INHALE 2.5 MCG INTO  THE LUNGS 2 (TWO) TIMES DAILY. 1 Inhaler 5  . warfarin (COUMADIN) 2 MG tablet TAKE 1 TABLET WITH 5 MG TABLET DAILY AS DIRECTED BY COUMADIN CLINIC 90 tablet 0  . warfarin (COUMADIN) 5 MG tablet TAKE 1 TABLET DAILY AS DIRECTED WITH 2 MG TABLET 90 tablet 1   No current facility-administered medications on file prior to visit.     Allergies  Allergen Reactions  . Codeine Anaphylaxis  . Gadolinium Derivatives Other (See Comments)    Other Reaction: intense sneezing MRI contrast  . Hydrocodone Anaphylaxis  . Iodinated Diagnostic Agents Other (See Comments)    Uncontrollable sneezing, needs sedative the night before and hour and benadryl before using Other Reaction: sneezing with SOB after prep  . Statins Other (See Comments)    Muscle spasm Muscle spasm  Muscle spasm  Myalgias  . Hydromorphone Other (See Comments)    Pt says he has never taken  . Lipitor [Atorvastatin Calcium] Other (See Comments)    Myalgias   . Other     Hydromorphone  . Seroquel [Quetiapine Fumerate] Other (See Comments)    "bad trip" tongue swell    Past Medical History:  Diagnosis Date  . Aortic aneurysm, thoracic ([place])   . Aortic valve disease   . Arthritis   . BPH (benign prostatic hyperplasia)   . COPD (chronic obstructive pulmonary disease) ([place])   . GERD (gastroesophageal reflux disease)   . HTN (hypertension)   . Hypercholesterolemia   . Stroke ([place])   . TIA (transient ischemic attack)     Past Surgical History:  Procedure Laterality Date  . ACHILLES TENDON REPAIR    . AORTIC VALVE REPLACEMENT     #23 On-X valve conduit  . ASCENDING AORTIC ANEURYSM REPAIR    . BACK SURGERY    . FOOT SURGERY      Social History   Tobacco Use  Smoking Status Never Smoker  Smokeless Tobacco Never Used    Social History   Substance and Sexual Activity  Alcohol Use No  . Alcohol/week: 0.0 standard drinks    Family History  Problem Relation Age of Onset  . Alzheimer's disease Father   .  Diabetes Father        age onset DM  . Hypertension Sister   . Hypertension Brother   . Diabetes Mother   . Diabetes Sister   . Colon cancer Neg Hx     Review of Systems: As noted in history of present illness.  All other systems were reviewed and are negative.  Physical Exam: BP 131/68   Pulse (!) 108   Ht 6\' 1"  (1.854 m)   Wt 199 lb 9.6 oz (90.5 kg)   BMI 26.33 kg/m  GENERAL:  Elderly WM appears weak. HEENT:  PERRL, EOMI, sclera are clear. Oropharynx is clear. NECK:  No jugular venous distention, carotid upstroke brisk and symmetric, no bruits, no thyromegaly or adenopathy LUNGS:  Clear to auscultation bilaterally CHEST:  There is tenderness to palpation at  the Xyphoid process.  HEART:  RRR,  PMI not displaced or sustained, loud mechanical AV click,  no S3, no S4: no clicks, no rubs, no murmurs ABD:  Soft, nontender. BS +, no masses or bruits. No hepatomegaly, no splenomegaly EXT:  2 + pulses throughout, no edema, no cyanosis no clubbing SKIN:  Warm and dry.  No rashes NEURO:  Alert and oriented x 3. Cranial nerves II through XII intact. PSYCH:  Cognitively intact   LABORATORY DATA:  Lab Results  Component Value Date   WBC 5.6 01/05/2019   HGB 9.3 (L) 01/05/2019   HCT 28.0 (L) 01/05/2019   PLT 352 01/05/2019   GLUCOSE 115 (H) 01/05/2019   CHOL 253 (H) 12/10/2016   TRIG 112.0 12/10/2016   HDL 45.00 12/10/2016   LDLDIRECT 172.0 11/14/2014   LDLCALC 185 (H) 12/10/2016   ALT 21 10/20/2017   AST 30 10/20/2017   NA 136 01/05/2019   K 4.7 01/05/2019   CL 102 01/05/2019   CREATININE 1.07 01/05/2019   BUN 16 01/05/2019   CO2 25 01/05/2019   TSH 2.32 01/04/2018   PSA 0.34 11/14/2014   INR 3.3 (A) 01/07/2019   HGBA1C 6.1 12/22/2016    Ecg today shows sinus tachycardia rate 108.  Otherwise normal. I have personally reviewed and interpreted this study.  Echo 06/25/16: Study Conclusions  - Procedure narrative: Transthoracic echocardiography. The study   was  technically difficult. - Left ventricle: The cavity size was normal. There was mild   concentric hypertrophy. Systolic function was mildly reduced. The   estimated ejection fraction was in the range of 45% to 50%.   Doppler parameters are consistent with abnormal left ventricular   relaxation (grade 1 diastolic dysfunction). - Aortic valve: A mechanical prosthesis was present and functioning   normally. Valve area (Vmax): 1.87 cm^2. - Mitral valve: There was mild regurgitation. - Left atrium: The atrium was moderately dilated. - Right atrium: The atrium was mildly dilated. - Pulmonary arteries: Systolic pressure was within the normal   range.  Myoview 06/25/16: Pharmacological myocardial perfusion imaging study with no significant  ischemia Normal wall motion, EF estimated at 50% No EKG changes concerning for ischemia at peak stress or in recovery. Low risk scan   Signed, Esmond Plants, MD, Ph.D [facility]   Assessment / Plan: 1. Status post mechanical aortic valve replacement with Bentall procedure. Continue anticoagulation with Coumadin. Echo in July 2020 showed normal valve function. Valve exam is normal.  2. Type B Aortic dissection. Diagnosed in September 2019. Followed by Dr. Mart Piggs at [facility]. Now with further aneurysmal dilation. S/p  total arch replacement and TEVAR procedure on October 29. Follow up CT on November 24 was satisfactory. Patient reports follow up with Dr Ysidro Evert in one year 3. Hypertension-controlled. Just on Toprol now 4. Status post CVA/right thalamic. Continue combined aspirin and Coumadin therapy.  5. Hypercholesterolemia. History of intolerance to lipitor and crestor. Was participating in clinical trial thru primary care.  6. COPD planning to do pulmonary Rehab. 7. Anemia post op secondary to blood loss. Repeat Hgb today 8. Weakness. Not at all surprising since he did recently have Covid and subsequent major operation. Will check CMET and TSH. Move  ahead with PT  Follow up in 2 months.

## 2019-01-10 ENCOUNTER — Encounter: Payer: Self-pay | Admitting: Cardiology

## 2019-01-10 ENCOUNTER — Ambulatory Visit (INDEPENDENT_AMBULATORY_CARE_PROVIDER_SITE_OTHER): Payer: Medicare Other | Admitting: Pharmacist

## 2019-01-10 ENCOUNTER — Other Ambulatory Visit: Payer: Self-pay

## 2019-01-10 ENCOUNTER — Ambulatory Visit: Payer: Medicare Other | Admitting: Cardiology

## 2019-01-10 VITALS — BP 131/68 | HR 108 | Ht 73.0 in | Wt 199.6 lb

## 2019-01-10 DIAGNOSIS — I7103 Dissection of thoracoabdominal aorta: Secondary | ICD-10-CM | POA: Diagnosis not present

## 2019-01-10 DIAGNOSIS — Z952 Presence of prosthetic heart valve: Secondary | ICD-10-CM | POA: Diagnosis not present

## 2019-01-10 DIAGNOSIS — G459 Transient cerebral ischemic attack, unspecified: Secondary | ICD-10-CM

## 2019-01-10 DIAGNOSIS — I10 Essential (primary) hypertension: Secondary | ICD-10-CM | POA: Diagnosis not present

## 2019-01-10 DIAGNOSIS — D62 Acute posthemorrhagic anemia: Secondary | ICD-10-CM

## 2019-01-10 LAB — POCT INR: INR: 1.7 — AB (ref 2.0–3.0)

## 2019-01-11 ENCOUNTER — Other Ambulatory Visit: Payer: Self-pay

## 2019-01-11 ENCOUNTER — Telehealth: Payer: Self-pay | Admitting: *Deleted

## 2019-01-11 ENCOUNTER — Other Ambulatory Visit (INDEPENDENT_AMBULATORY_CARE_PROVIDER_SITE_OTHER): Payer: Medicare Other

## 2019-01-11 ENCOUNTER — Encounter: Payer: Self-pay | Admitting: *Deleted

## 2019-01-11 ENCOUNTER — Ambulatory Visit: Payer: Self-pay

## 2019-01-11 ENCOUNTER — Telehealth: Payer: Self-pay

## 2019-01-11 DIAGNOSIS — R300 Dysuria: Secondary | ICD-10-CM | POA: Diagnosis not present

## 2019-01-11 DIAGNOSIS — J449 Chronic obstructive pulmonary disease, unspecified: Secondary | ICD-10-CM

## 2019-01-11 LAB — COMPREHENSIVE METABOLIC PANEL
ALT: 15 IU/L (ref 0–44)
AST: 20 IU/L (ref 0–40)
Albumin/Globulin Ratio: 1.3 (ref 1.2–2.2)
Albumin: 3.7 g/dL — ABNORMAL LOW (ref 3.8–4.8)
Alkaline Phosphatase: 112 IU/L (ref 39–117)
BUN/Creatinine Ratio: 14 (ref 10–24)
BUN: 14 mg/dL (ref 8–27)
Bilirubin Total: 0.8 mg/dL (ref 0.0–1.2)
CO2: 22 mmol/L (ref 20–29)
Calcium: 9.2 mg/dL (ref 8.6–10.2)
Chloride: 98 mmol/L (ref 96–106)
Creatinine, Ser: 1.03 mg/dL (ref 0.76–1.27)
GFR calc Af Amer: 85 mL/min/{1.73_m2} (ref >59)
GFR calc non Af Amer: 73 mL/min/{1.73_m2} (ref >59)
Globulin, Total: 2.9 g/dL (ref 1.5–4.5)
Glucose: 150 mg/dL — ABNORMAL HIGH (ref 65–99)
Potassium: 5.2 mmol/L (ref 3.5–5.2)
Sodium: 137 mmol/L (ref 134–144)
Total Protein: 6.6 g/dL (ref 6.0–8.5)

## 2019-01-11 LAB — POCT URINALYSIS DIPSTICK
Glucose, UA: POSITIVE — AB
Ketones, UA: 15
Nitrite, UA: POSITIVE
Protein, UA: POSITIVE — AB
Spec Grav, UA: 1.02 (ref 1.010–1.025)
Urobilinogen, UA: 4 E.U./dL — AB
pH, UA: 5 (ref 5.0–8.0)

## 2019-01-11 LAB — CBC
Hematocrit: 28.4 % — ABNORMAL LOW (ref 37.5–51.0)
Hemoglobin: 9.3 g/dL — ABNORMAL LOW (ref 13.0–17.7)
MCH: 29.6 pg (ref 26.6–33.0)
MCHC: 32.7 g/dL (ref 31.5–35.7)
MCV: 90 fL (ref 79–97)
Platelets: 305 10*3/uL (ref 150–450)
RBC: 3.14 x10E6/uL — ABNORMAL LOW (ref 4.14–5.80)
RDW: 14.3 % (ref 11.6–15.4)
WBC: 7.2 10*3/uL (ref 3.4–10.8)

## 2019-01-11 LAB — URINALYSIS, MICROSCOPIC ONLY: RBC / HPF: NONE SEEN (ref 0–?)

## 2019-01-11 LAB — TSH: TSH: 3.45 u[IU]/mL (ref 0.450–4.500)

## 2019-01-11 NOTE — Telephone Encounter (Signed)
Attempted to follow up with pt.  Unable to get through on phone listed.  Pt has been out since October due to COVID-19 and surgery.  Will send email.

## 2019-01-11 NOTE — Telephone Encounter (Signed)
He can drop the urine off today. Please find out if he is having burning, frequency, urgency, fevers, abdominal pain, or penile discharge. He does not need to keep the appointment at this time. Please order a POC5, urine micro, and culture. Thanks.

## 2019-01-11 NOTE — Telephone Encounter (Signed)
I was able to reach patient's wife while husband was in PT. She willl bring him by right afterwards to leave urine sample. They have been put on lab scheduled & labs ordered.

## 2019-01-11 NOTE — Telephone Encounter (Signed)
Pt returned call for urinalysis. Lab message given to pt and his wife with verbal understanding. He is advised to take Azo. As recommended. She stated that she had given him duricef and it helped with his symptoms.  He is having symptoms of frequency, hesitancy, burning, itching and stinging when he urinates. He denies a fever. Call was disconnected before giving advice for at home uti. Will route to provider at [facility] at [facility].

## 2019-01-11 NOTE — Telephone Encounter (Signed)
Provided Patient and his wife the results of lab work of  today.  States he did not know about AZO was told it has warfin in it.  Whic he can not take.     Wants to know  If an antibiotic will be called in .  Would like  Return call .  Reports frequency an burning and urgency, .  Voiding small amounts  Of urine.

## 2019-01-11 NOTE — Telephone Encounter (Signed)
I called to speak with patient's wife & she stated that husband tested positive for UTI with strips. She asked if they could drop off a urine to be tested. We do not have any appointments until Friday & he is scheduled with Dr. Derrel Nip @ 50. Can patient drop off urine today? Do you want him to keep appointment Friday? They were requesting an antibiotic based on OA & culture. Please advise?

## 2019-01-11 NOTE — Progress Notes (Unsigned)
poct

## 2019-01-12 MED ORDER — CEPHALEXIN 500 MG PO CAPS: 500.0000 mg | ORAL_CAPSULE | Freq: Four times a day (QID) | ORAL | 0 refills | Status: DC

## 2019-01-12 NOTE — Addendum Note (Signed)
Addended by: [name], Korion Cuevas G on: 01/12/2019 11:03 AM   Modules accepted: Orders

## 2019-01-12 NOTE — Telephone Encounter (Signed)
Noted. The patient does not need to take azo. I wanted to know if he had been taking it as his urine dipstick was pan-positive. I will send in keflex for him to take for the UTI and we will contact him once the urine culture results return. f his symptoms are not improving on the keflex he should let us know. Thanks.

## 2019-01-12 NOTE — Telephone Encounter (Signed)
Please see the other phone note regarding this.  I called in the antibiotic earlier today.  If his symptoms do not start to improve with the antibiotics he will need an in person evaluation at an urgent care.

## 2019-01-12 NOTE — Telephone Encounter (Signed)
Provided Patient and his wife the results of lab work of  today.  States he did not know about AZO was told it has warfin in it.  Whic he can not take.     Wants to know  If an antibiotic will be called in .  Would like  Return call .  Reports frequency an burning and urgency, .  Voiding small amounts  Of urine.   Patryce Depriest,cma

## 2019-01-12 NOTE — Telephone Encounter (Signed)
Noted  

## 2019-01-12 NOTE — Telephone Encounter (Signed)
I called and spoke with the patient and he stated a nurse on the [facility] is who told him to take AZO and Cranberry juice, he stated he was upset with the miscommunication.  I informed him of the antibiotic Keflex that was sent in to the pharmacy, and I informed him that that we were waiting on the urine culture to come back and if he has any symptoms that were not improving let us know.  He understood.  Nina,cma

## 2019-01-13 ENCOUNTER — Other Ambulatory Visit: Payer: Self-pay | Admitting: Family Medicine

## 2019-01-13 LAB — URINE CULTURE
MICRO NUMBER:: 1175495
SPECIMEN QUALITY:: ADEQUATE

## 2019-01-13 MED ORDER — SULFAMETHOXAZOLE-TRIMETHOPRIM 800-160 MG PO TABS: 1.0000 | ORAL_TABLET | Freq: Two times a day (BID) | ORAL | 0 refills | Status: DC

## 2019-01-14 ENCOUNTER — Ambulatory Visit: Payer: Medicare Other | Admitting: Internal Medicine

## 2019-01-15 LAB — POCT INR: INR: 2.7 (ref 2.0–3.0)

## 2019-01-17 ENCOUNTER — Ambulatory Visit (INDEPENDENT_AMBULATORY_CARE_PROVIDER_SITE_OTHER): Payer: Medicare Other | Admitting: Cardiovascular Disease

## 2019-01-17 DIAGNOSIS — I359 Nonrheumatic aortic valve disorder, unspecified: Secondary | ICD-10-CM | POA: Diagnosis not present

## 2019-01-17 DIAGNOSIS — Z7901 Long term (current) use of anticoagulants: Secondary | ICD-10-CM | POA: Diagnosis not present

## 2019-01-19 ENCOUNTER — Other Ambulatory Visit: Payer: Self-pay

## 2019-01-19 ENCOUNTER — Ambulatory Visit (INDEPENDENT_AMBULATORY_CARE_PROVIDER_SITE_OTHER): Payer: Medicare Other | Admitting: Family Medicine

## 2019-01-19 ENCOUNTER — Ambulatory Visit (INDEPENDENT_AMBULATORY_CARE_PROVIDER_SITE_OTHER): Payer: Medicare Other

## 2019-01-19 DIAGNOSIS — W19XXXA Unspecified fall, initial encounter: Secondary | ICD-10-CM | POA: Diagnosis not present

## 2019-01-19 DIAGNOSIS — Z0000 Encounter for general adult medical examination without abnormal findings: Secondary | ICD-10-CM | POA: Diagnosis not present

## 2019-01-19 DIAGNOSIS — R61 Generalized hyperhidrosis: Secondary | ICD-10-CM

## 2019-01-19 DIAGNOSIS — R200 Anesthesia of skin: Secondary | ICD-10-CM

## 2019-01-19 DIAGNOSIS — G44209 Tension-type headache, unspecified, not intractable: Secondary | ICD-10-CM | POA: Diagnosis not present

## 2019-01-19 DIAGNOSIS — R5381 Other malaise: Secondary | ICD-10-CM

## 2019-01-19 DIAGNOSIS — I712 Thoracic aortic aneurysm, without rupture: Secondary | ICD-10-CM

## 2019-01-19 DIAGNOSIS — I71012 Dissection of descending thoracic aorta: Secondary | ICD-10-CM

## 2019-01-19 DIAGNOSIS — I7101 Dissection of thoracic aorta: Secondary | ICD-10-CM

## 2019-01-19 DIAGNOSIS — N3000 Acute cystitis without hematuria: Secondary | ICD-10-CM

## 2019-01-19 DIAGNOSIS — N5089 Other specified disorders of the male genital organs: Secondary | ICD-10-CM

## 2019-01-19 MED ORDER — ZOSTER VAC RECOMB ADJUVANTED 50 MCG/0.5ML IM SUSR: 0.5000 mL | Freq: Once | INTRAMUSCULAR | 0 refills | Status: AC

## 2019-01-19 NOTE — Progress Notes (Signed)
Subjective:   Alexander Barton is a 68 y.o. male who presents for Medicare Annual/Subsequent preventive examination.  Review of Systems:  No ROS.  Medicare Wellness Virtual Visit.  Visual/audio telehealth visit, UTA vital signs.   See social history for additional risk factors.   Cardiac Risk Factors include: advanced age (>42men, >62 women);male gender;hypertension     Objective:    Vitals: There were no vitals taken for this visit.  There is no height or weight on file to calculate BMI.  Advanced Directives 01/19/2019 09/21/2018 01/13/2018 10/20/2017 12/22/2016 08/12/2016 06/24/2016  Does Patient Have a Medical Advance Directive? Yes Yes Yes No Yes Yes Yes  Type of Paramedic of [place];Living will Living will;Healthcare Power of [place];Living will - Living will;Healthcare Power of Attorney Living will [place]  Does patient want to make changes to medical advance directive? No - Patient declined No - Patient declined No - Patient declined - No - Patient declined - No - Patient declined  Copy of [place] in Chart? Yes - validated most recent copy scanned in chart (See row information) Yes - validated most recent copy scanned in chart (See row information) Yes - validated most recent copy scanned in chart (See row information) - Yes - No - copy requested  Would patient like information on creating a medical advance directive? - - - No - Patient declined - - -    Tobacco Social History   Tobacco Use  Smoking Status Never Smoker  Smokeless Tobacco Never Used     Counseling given: Not Answered   Clinical Intake:  Pre-visit preparation completed: Yes        Diabetes: No  How often do you need to have someone help you when you read instructions, pamphlets, or other written materials from your doctor or pharmacy?: 1 - Never  Interpreter Needed?: No     Past Medical History:    Diagnosis Date  . Aortic aneurysm, thoracic ([place])   . Aortic valve disease   . Arthritis   . BPH (benign prostatic hyperplasia)   . COPD (chronic obstructive pulmonary disease) ([place])   . GERD (gastroesophageal reflux disease)   . HTN (hypertension)   . Hypercholesterolemia   . Stroke ([place])   . TIA (transient ischemic attack)    Past Surgical History:  Procedure Laterality Date  . ACHILLES TENDON REPAIR    . AORTIC VALVE REPLACEMENT     #23 On-X valve conduit  . ASCENDING AORTIC ANEURYSM REPAIR    . BACK SURGERY    . FOOT SURGERY     Family History  Problem Relation Age of Onset  . Alzheimer's disease Father   . Diabetes Father        age onset DM  . Hypertension Sister   . Hypertension Brother   . Diabetes Mother   . Diabetes Sister   . Colon cancer Neg Hx    Social History   Socioeconomic History  . Marital status: Married    Spouse name: Not on file  . Number of children: 2  . Years of education: Not on file  . Highest education level: Not on file  Occupational History  . Occupation: Scientist, clinical (histocompatibility and immunogenetics): [employer]  Tobacco Use  . Smoking status: Never Smoker  . Smokeless tobacco: Never Used  Substance and Sexual Activity  . Alcohol use: No    Alcohol/week: 0.0 standard drinks  .  Drug use: No  . Sexual activity: Not Currently  Other Topics Concern  . 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   One son and one daughter   He lives in [place]   One caffeinated beverage daily   08/30/2014   Social Determinants of Health   Financial Resource Strain:   . Difficulty of Paying Living Expenses: Not on file  Food Insecurity:   . Worried About Charity fundraiser in the Last Year: Not on file  . Ran Out of Food in the Last Year: Not on file  Transportation Needs:   . Lack of Transportation (Medical): Not on file  . Lack of Transportation (Non-Medical): Not on  file  Physical Activity:   . Days of Exercise per Week: Not on file  . Minutes of Exercise per Session: Not on file  Stress:   . Feeling of Stress : Not on file  Social Connections:   . Frequency of Communication with Friends and Family: Not on file  . Frequency of Social Gatherings with Friends and Family: Not on file  . Attends Religious Services: Not on file  . Active Member of Clubs or Organizations: Not on file  . Attends Archivist Meetings: Not on file  . Marital Status: Not on file    Outpatient Encounter Medications as of 01/19/2019  Medication Sig  . albuterol (VENTOLIN HFA) 108 (90 Base) MCG/ACT inhaler Inhale 2 puffs into the lungs every 4 (four) hours as needed for wheezing or shortness of breath.  Marland Kitchen aspirin 81 MG tablet Take 81 mg by mouth at bedtime.   . benzonatate (TESSALON) 100 MG capsule Take 1 or 2 capsules every 8 hours as needed for cough  . budesonide-formoterol (SYMBICORT) 160-4.5 MCG/ACT inhaler TAKE 2 PUFFS BY MOUTH TWICE A DAY  . metoprolol succinate (TOPROL-XL) 50 MG 24 hr tablet TAKE 1 TABLET (50 MG TOTAL) BY MOUTH DAILY. TAKE WITH OR IMMEDIATELY FOLLOWING A MEAL.  . NON FORMULARY   . Polyethylene Glycol 3350 (MIRALAX PO) Take by mouth.  . senna-docusate (SENOKOT-S) 8.6-50 MG tablet Take by mouth.  . sulfamethoxazole-trimethoprim (BACTRIM DS) 800-160 MG tablet Take 1 tablet by mouth 2 (two) times daily.  . tamsulosin (FLOMAX) 0.4 MG CAPS capsule TAKE 2 CAPSULES BY MOUTH EVERY DAY  . Tiotropium Bromide Monohydrate (SPIRIVA RESPIMAT) 2.5 MCG/ACT AERS INHALE 2.5 MCG INTO THE LUNGS 2 (TWO) TIMES DAILY.  Marland Kitchen warfarin (COUMADIN) 2 MG tablet TAKE 1 TABLET WITH 5 MG TABLET DAILY AS DIRECTED BY COUMADIN CLINIC  . warfarin (COUMADIN) 5 MG tablet TAKE 1 TABLET DAILY AS DIRECTED WITH 2 MG TABLET  . furosemide (LASIX) 20 MG tablet TAKE 1/2 TABLET (10 MG) 1 OR 2 TIMES A DAY AS NEEDED FOR LEG SWELLING (Patient not taking: Reported on 01/19/2019)   No  facility-administered encounter medications on file as of 01/19/2019.    Activities of Daily Living In your present state of health, do you have any difficulty performing the following activities: 01/19/2019  Hearing? N  Vision? N  Difficulty concentrating or making decisions? N  Walking or climbing stairs? Y  Dressing or bathing? N  Doing errands, shopping? Y  Comment He does not currently Physiological scientist and eating ? N  Using the Toilet? N  In the past six months, have you accidently leaked urine? N  Do you have problems with loss of  bowel control? N  Managing your Medications? N  Managing your Finances? N  Housekeeping or managing your Housekeeping? N  Some recent data might be hidden    Patient Care Team: Leone Haven, MD as PCP - General (Family Medicine)   Assessment:   This is a routine wellness examination for Alexander Barton.  Nurse connected with patient 01/19/19 at  8:30 AM EST by a telephone enabled telemedicine application and verified that I am speaking with the correct person using two identifiers. Patient stated full name and DOB. Patient gave permission to continue with virtual visit. Patient's location was at home and Nurse's location was at [place] office.   Patient is alert and oriented x3. Patient denies difficulty focusing or concentrating. Patient likes to read for brain stimulation.   Health Maintenance Due: Urine Microalbumin- followed by pcp See completed HM at the end of note.   Eye: Visual acuity not assessed. Virtual visit. Followed by their ophthalmologist.  Dental: Visits every 6 months.    Hearing: Demonstrates normal hearing during visit.  Safety:  Patient feels safe at home- yes Patient does have smoke detectors at home- yes Patient does wear sunscreen or protective clothing when in direct sunlight - yes Patient does wear seat belt when in a moving vehicle - yes Patient drives- not currently Adequate lighting in walkways free  from debris- yes Grab bars and handrails used as appropriate- yes Ambulates with an assistive device- yes; walker Cell phone on person when ambulating outside of the home- yes  Social: Alcohol intake - no   Smoking history- never   Smokers in home? none Illicit drug use? none  Medication: Taking as directed and without issues.  Self managed - yes   Covid-19: Precautions and sickness symptoms discussed. Wears mask, social distancing, hand hygiene as appropriate.   Activities of Daily Living Patient denies needing assistance with: household chores, feeding themselves, getting from bed to chair, getting to the toilet, bathing/showering, dressing, managing money, or preparing meals.   Discussed the importance of a healthy diet, water intake and the benefits of aerobic exercise.  Educational material provided.  Physical activity- physical therapy balancing and strengthening exercises.  Diet:  Regular Water: good intake  Other Providers Patient Care Team: Leone Haven, MD as PCP - General (Family Medicine)   Exercise Activities and Dietary recommendations Current Exercise Habits: Home exercise routine, Time (Minutes): 60, Frequency (Times/Week): 2, Weekly Exercise (Minutes/Week): 120, Intensity: Mild  Goals    . Follow up with Primary Care Provider     As needed       Fall Risk Fall Risk  01/19/2019 09/10/2018 01/04/2018 12/22/2016 11/19/2016  Falls in the past year? 1 1 0 No No  Number falls in past yr: 0 0 0 - -  Injury with Fall? 0 0 0 - -  Comment Unsteady without walker and stumbled. Now uses walker daily. - - - -  Follow up Falls prevention discussed;Education provided - - - -   Timed Get Up and Go Performed: no, virtual visit  Depression Screen PHQ 2/9 Scores 01/19/2019 10/18/2018 09/22/2018 09/10/2018  PHQ - 2 Score 0 0 2 0  PHQ- 9 Score - 8 7 -    Cognitive Function MMSE - Mini Mental State Exam 01/13/2018 12/22/2016 12/05/2015  Orientation to time 5 5 5     Orientation to Place 5 5 5   Registration 3 3 3   Attention/ Calculation 5 5 5   Recall 3 3 3   Language- name 2 objects 2  2 2  Language- repeat 1 1 1   Language- follow 3 step command 3 3 3   Language- read & follow direction 1 1 1   Write a sentence 1 1 1   Copy design 1 1 1   Total score 30 30 30      6CIT Screen 01/19/2019  What Year? 0 points  What month? 0 points  What time? 0 points    Immunization History  Administered Date(s) Administered  . Influenza Split 02/14/2011  . Influenza, High Dose Seasonal PF 11/04/2016, 11/23/2017  . Influenza,inj,Quad PF,6+ Mos 11/14/2014  . Influenza-Unspecified 11/14/2014  . Pneumococcal Conjugate-13 11/14/2014  . Pneumococcal Polysaccharide-23 01/04/2007, 12/05/2015  . Td 02/04/2000  . Tdap 08/10/2012  . Zoster 10/04/2010  . Zoster Recombinat (Shingrix) 08/24/2018   Screening Tests Health Maintenance  Topic Date Due  . URINE MICROALBUMIN  09/24/1958  . INFLUENZA VACCINE  09/04/2018  . TETANUS/TDAP  08/11/2022  . COLONOSCOPY  10/11/2024  . Hepatitis C Screening  Completed  . PNA vac Low Risk Adult  Completed       Plan:   Keep all routine maintenance appointments.   Follow up with your doctor today @ 9:00.   Medicare Attestation I have personally reviewed: The patient's medical and social history Their use of alcohol, tobacco or illicit drugs Their current medications and supplements The patient's functional ability including ADLs,fall risks, home safety risks, cognitive, and hearing and visual impairment Diet and physical activities Evidence for depression   I have reviewed and discussed with patient certain preventive protocols, quality metrics, and best practice recommendations.   Varney Biles, LPN  [id number]

## 2019-01-19 NOTE — Progress Notes (Signed)
Virtual Visit via telephone Note  This visit type was conducted due to national recommendations for restrictions regarding the COVID-19 pandemic (e.g. social distancing).  This format is felt to be most appropriate for this patient at this time.  All issues noted in this document were discussed and addressed.  No physical exam was performed (except for noted visual exam findings with Video Visits).   I connected with Alexander Barton today at  9:00 AM EST by telephone and verified that I am speaking with the correct person using two identifiers. Location patient: home Location provider: work  Persons participating in the virtual visit: patient, provider, Jemari Hoard (wife)  I discussed the limitations, risks, security and privacy concerns of performing an evaluation and management service by telephone and the availability of in person appointments. I also discussed with the patient that there may be a patient responsible charge related to this service. The patient expressed understanding and agreed to proceed.  Interactive audio and video telecommunications were attempted between this provider and patient, however failed, due to patient having technical difficulties OR patient did not have access to video capability.  We continued and completed visit with audio only.   Reason for visit: follow-up  HPI: Night sweats: Patient notes this has been going on for about 2 weeks.  It has occurred on 3 occurrences.  Last occurred 2-3 nights ago.  Notes he will wake up soaked and have to change his clothes.  The other nights he does fine.  House is set at 15 F and he sleeps with just the sheet.  He checked his temperature and has not had a fever.  He has been treated for UTI and notes the sweats started just before the UTI was diagnosed.  No chest pain, shortness of breath, cough, itching, abdominal pain, diarrhea, or vomiting.  He notes he did previously have dysuria though that has resolved with  antibiotic treatment.  He has had some weight loss though he previously had COVID-19 and has had a major surgery recently and was hospitalized for several weeks..  Currently has no UTI symptoms.  Headaches: Patient notes frontal headaches that have been occurring since he had his repair of his aortic dissection.  He discussed this with the cardiologist at his most recent visit and they deferred to discuss with [place].  He notes no history of significant headaches.  He has had some numbness in the index and middle fingers on the tips on both hands.  That has been ongoing for some time.  No focal weakness.  No vision changes recently.  He takes Tylenol to take the edge off.  Headaches are greater at night than during the day.  Described as dull.  Patient has had a recent CT head with no acute changes.  Leg weakness: Patient reports generalized weakness over the last couple of months.  He notes his legs feel particularly weak and has difficulty walking.  He had a fall on Monday though no injury.  He has been doing physical therapy for 2 weeks and notes that has been helpful.  He is using a walker.  Genital swelling: Patient notes he has been sitting a lot.  Notes that started right before the UTI symptoms and states he was advised that it was related to the UTI.  He notes this has resolved with treatment of his UTI.   ROS: See pertinent positives and negatives per HPI.  Past Medical History:  Diagnosis Date  . Aortic aneurysm, thoracic ([place])   .  Aortic valve disease   . Arthritis   . BPH (benign prostatic hyperplasia)   . COPD (chronic obstructive pulmonary disease) ([place])   . GERD (gastroesophageal reflux disease)   . HTN (hypertension)   . Hypercholesterolemia   . Stroke ([place])   . TIA (transient ischemic attack)     Past Surgical History:  Procedure Laterality Date  . ACHILLES TENDON REPAIR    . AORTIC VALVE REPLACEMENT     #23 On-X valve conduit  . ASCENDING AORTIC ANEURYSM REPAIR    . BACK  SURGERY    . FOOT SURGERY      Family History  Problem Relation Age of Onset  . Alzheimer's disease Father   . Diabetes Father        age onset DM  . Hypertension Sister   . Hypertension Brother   . Diabetes Mother   . Diabetes Sister   . Colon cancer Neg Hx     SOCIAL HX: Non-smoker.   Current Outpatient Medications:  .  albuterol (VENTOLIN HFA) 108 (90 Base) MCG/ACT inhaler, Inhale 2 puffs into the lungs every 4 (four) hours as needed for wheezing or shortness of breath., Disp: 18 g, Rfl: 1 .  aspirin 81 MG tablet, Take 81 mg by mouth at bedtime. , Disp: , Rfl:  .  budesonide-formoterol (SYMBICORT) 160-4.5 MCG/ACT inhaler, TAKE 2 PUFFS BY MOUTH TWICE A DAY, Disp: 30.6 Inhaler, Rfl: 4 .  furosemide (LASIX) 20 MG tablet, TAKE 1/2 TABLET (10 MG) 1 OR 2 TIMES A DAY AS NEEDED FOR LEG SWELLING, Disp: 90 tablet, Rfl: 1 .  metoprolol succinate (TOPROL-XL) 50 MG 24 hr tablet, TAKE 1 TABLET (50 MG TOTAL) BY MOUTH DAILY. TAKE WITH OR IMMEDIATELY FOLLOWING A MEAL., Disp: 90 tablet, Rfl: 1 .  NON FORMULARY, , Disp: , Rfl:  .  Polyethylene Glycol 3350 (MIRALAX PO), Take by mouth., Disp: , Rfl:  .  senna-docusate (SENOKOT-S) 8.6-50 MG tablet, Take by mouth., Disp: , Rfl:  .  tamsulosin (FLOMAX) 0.4 MG CAPS capsule, TAKE 2 CAPSULES BY MOUTH EVERY DAY, Disp: 174 capsule, Rfl: 1 .  Tiotropium Bromide Monohydrate (SPIRIVA RESPIMAT) 2.5 MCG/ACT AERS, INHALE 2.5 MCG INTO THE LUNGS 2 (TWO) TIMES DAILY., Disp: 1 Inhaler, Rfl: 5 .  warfarin (COUMADIN) 2 MG tablet, TAKE 1 TABLET WITH 5 MG TABLET DAILY AS DIRECTED BY COUMADIN CLINIC, Disp: 90 tablet, Rfl: 0 .  warfarin (COUMADIN) 5 MG tablet, TAKE 1 TABLET DAILY AS DIRECTED WITH 2 MG TABLET, Disp: 90 tablet, Rfl: 1 .  benzonatate (TESSALON) 100 MG capsule, Take 1 or 2 capsules every 8 hours as needed for cough (Patient not taking: Reported on 01/19/2019), Disp: 30 capsule, Rfl: 1 .  sulfamethoxazole-trimethoprim (BACTRIM DS) 800-160 MG tablet, Take 1 tablet  by mouth 2 (two) times daily. (Patient not taking: Reported on 01/19/2019), Disp: 14 tablet, Rfl: 0  EXAM: This is a telehealth telephone visit and thus no physical exam was completed.   ASSESSMENT AND PLAN:  Discussed the following assessment and plan:  Dissecting aneurysm of thoracic aorta, [place] type B (HCC) Status post surgical repair.  He will continue to follow with his surgeon and cardiology.  Bilateral finger numbness Possibly nerve impingement.  Will obtain an x-ray of his neck.  We are referring to neurology for his headaches and they can discuss as well.  Headache Tension type headache in the frontal region.  Given deconditioning and weakness I am hesitant to place him on a TCA to help treat this.  Referring to neurology for further treatment and evaluation.  Fall Recent fall with no injury.  He will continue PT.  I encouraged him to use his walker at all times when he is ambulating.  Night sweats Potentially related to his UTI.  Discussed monitoring while he completes his antibiotics and if they recur once he finishes antibiotic therapy he should let us know so we can obtain lab work.  UTI (urinary tract infection) He will complete his Bactrim.  If he has recurrent symptoms he will let us know.  Physical deconditioning Patient's weakness is likely related to deconditioning.  I discussed it may take several months for him to regain his strength back to his baseline given how long he was hospitalized for.  He will continue physical therapy and he will continue to monitor his symptoms.  Swelling of male genital structure Improved with treatment of UTI.  He will monitor for recurrence.    I discussed the assessment and treatment plan with the patient. The patient was provided an opportunity to ask questions and all were answered. The patient agreed with the plan and demonstrated an understanding of the instructions.   The patient was advised to call back or seek an  in-person evaluation if the symptoms worsen or if the condition fails to improve as anticipated.  I provided 35 minutes of non-face-to-face time during this encounter.   [name], MD

## 2019-01-19 NOTE — Patient Instructions (Addendum)
  Alexander Barton , Thank you for taking time to come for your Medicare Wellness Visit. I appreciate your ongoing commitment to your health goals. Please review the following plan we discussed and let me know if I can assist you in the future.   These are the goals we discussed: Goals    . Follow up with Primary Care Provider     As needed       This is a list of the screening recommended for you and due dates:  Health Maintenance  Topic Date Due  . Urine Protein Check  09[birth year]24/1960  . Flu Shot  09/04/2018  . Tetanus Vaccine  08/11/2022  . Colon Cancer Screening  10/11/2024  .  Hepatitis C: One time screening is recommended by Center for Disease Control  (CDC) for  adults born from [birth year] through 1967.   Completed  . Pneumonia vaccines  Completed

## 2019-01-20 ENCOUNTER — Ambulatory Visit (INDEPENDENT_AMBULATORY_CARE_PROVIDER_SITE_OTHER): Payer: Medicare Other | Admitting: Pharmacist Clinician (PhC)/ Clinical Pharmacy Specialist

## 2019-01-20 DIAGNOSIS — N5089 Other specified disorders of the male genital organs: Secondary | ICD-10-CM | POA: Insufficient documentation

## 2019-01-20 DIAGNOSIS — R61 Generalized hyperhidrosis: Secondary | ICD-10-CM | POA: Insufficient documentation

## 2019-01-20 DIAGNOSIS — I359 Nonrheumatic aortic valve disorder, unspecified: Secondary | ICD-10-CM

## 2019-01-20 DIAGNOSIS — N390 Urinary tract infection, site not specified: Secondary | ICD-10-CM | POA: Insufficient documentation

## 2019-01-20 DIAGNOSIS — R200 Anesthesia of skin: Secondary | ICD-10-CM | POA: Insufficient documentation

## 2019-01-20 DIAGNOSIS — Z7901 Long term (current) use of anticoagulants: Secondary | ICD-10-CM

## 2019-01-20 DIAGNOSIS — R519 Headache, unspecified: Secondary | ICD-10-CM | POA: Insufficient documentation

## 2019-01-20 LAB — POCT INR: INR: 2.5 (ref 2.0–3.0)

## 2019-01-20 NOTE — Assessment & Plan Note (Signed)
He will complete his Bactrim.  If he has recurrent symptoms he will let us know.

## 2019-01-20 NOTE — Assessment & Plan Note (Signed)
Status post surgical repair.  He will continue to follow with his surgeon and cardiology.

## 2019-01-20 NOTE — Assessment & Plan Note (Signed)
Improved with treatment of UTI.  He will monitor for recurrence.

## 2019-01-20 NOTE — Assessment & Plan Note (Signed)
Potentially related to his UTI.  Discussed monitoring while he completes his antibiotics and if they recur once he finishes antibiotic therapy he should let us know so we can obtain lab work.

## 2019-01-20 NOTE — Assessment & Plan Note (Signed)
Recent fall with no injury.  He will continue PT.  I encouraged him to use his walker at all times when he is ambulating.

## 2019-01-20 NOTE — Assessment & Plan Note (Signed)
Possibly nerve impingement.  Will obtain an x-ray of his neck.  We are referring to neurology for his headaches and they can discuss as well.

## 2019-01-20 NOTE — Assessment & Plan Note (Signed)
Tension type headache in the frontal region.  Given deconditioning and weakness I am hesitant to place him on a TCA to help treat this.  Referring to neurology for further treatment and evaluation.

## 2019-01-20 NOTE — Assessment & Plan Note (Signed)
Patient's weakness is likely related to deconditioning.  I discussed it may take several months for him to regain his strength back to his baseline given how long he was hospitalized for.  He will continue physical therapy and he will continue to monitor his symptoms.

## 2019-01-21 ENCOUNTER — Ambulatory Visit: Payer: Medicare Other | Admitting: Family Medicine

## 2019-01-22 ENCOUNTER — Other Ambulatory Visit: Payer: Self-pay | Admitting: Cardiology

## 2019-01-23 NOTE — Progress Notes (Signed)
I have reviewed the above note and agree.  Nora Rooke, M.D.  

## 2019-01-24 ENCOUNTER — Telehealth: Payer: Self-pay

## 2019-01-24 NOTE — Telephone Encounter (Signed)
Spoke to patient 01/11/19 lab results given.Patient stated he woke up last week appox 3 times perspiring heavy,no fever.Stated he has a UTI. Antibiotic has been changed.Stated he is coumadin and he eats dark greens consistently.He wanted to ask Dr.Jordan if he can eat red meat.Dr.Jordan advised ok to eat red meat a couple of times each week.Advised to call back if he continues to have night sweats.

## 2019-01-26 ENCOUNTER — Ambulatory Visit (INDEPENDENT_AMBULATORY_CARE_PROVIDER_SITE_OTHER): Payer: Medicare Other | Admitting: Pharmacist Clinician (PhC)/ Clinical Pharmacy Specialist

## 2019-01-26 DIAGNOSIS — Z952 Presence of prosthetic heart valve: Secondary | ICD-10-CM

## 2019-01-26 DIAGNOSIS — Z7901 Long term (current) use of anticoagulants: Secondary | ICD-10-CM

## 2019-01-26 DIAGNOSIS — I359 Nonrheumatic aortic valve disorder, unspecified: Secondary | ICD-10-CM

## 2019-01-26 LAB — POCT INR: INR: 2.9 (ref 2.0–3.0)

## 2019-01-26 NOTE — Progress Notes (Signed)
Pulmonary Individual Treatment Plan  Patient Details  Name: Alexander Barton MRN: [id number] Date of Birth: 1950-04-18 Referring Provider:     Cardiac Rehab from 09/22/2018 in [facility] Cardiac and Pulmonary Rehab  Referring Provider  Sonnenberg      Initial Encounter Date:    Cardiac Rehab from 09/22/2018 in [facility] Cardiac and Pulmonary Rehab  Date  09/22/18      Visit Diagnosis: No diagnosis found.  Patient's Home Medications on Admission:  Current Outpatient Medications:  .  albuterol (VENTOLIN HFA) 108 (90 Base) MCG/ACT inhaler, Inhale 2 puffs into the lungs every 4 (four) hours as needed for wheezing or shortness of breath., Disp: 18 g, Rfl: 1 .  aspirin 81 MG tablet, Take 81 mg by mouth at bedtime. , Disp: , Rfl:  .  benzonatate (TESSALON) 100 MG capsule, Take 1 or 2 capsules every 8 hours as needed for cough (Patient not taking: Reported on 01/19/2019), Disp: 30 capsule, Rfl: 1 .  budesonide-formoterol (SYMBICORT) 160-4.5 MCG/ACT inhaler, TAKE 2 PUFFS BY MOUTH TWICE A DAY, Disp: 30.6 Inhaler, Rfl: 4 .  furosemide (LASIX) 20 MG tablet, TAKE 1/2 TABLET (10 MG) 1 OR 2 TIMES A DAY AS NEEDED FOR LEG SWELLING, Disp: 90 tablet, Rfl: 1 .  metoprolol succinate (TOPROL-XL) 50 MG 24 hr tablet, TAKE 1 TABLET (50 MG TOTAL) BY MOUTH DAILY. TAKE WITH OR IMMEDIATELY FOLLOWING A MEAL., Disp: 90 tablet, Rfl: 1 .  NON FORMULARY, , Disp: , Rfl:  .  Polyethylene Glycol 3350 (MIRALAX PO), Take by mouth., Disp: , Rfl:  .  senna-docusate (SENOKOT-S) 8.6-50 MG tablet, Take by mouth., Disp: , Rfl:  .  sulfamethoxazole-trimethoprim (BACTRIM DS) 800-160 MG tablet, Take 1 tablet by mouth 2 (two) times daily. (Patient not taking: Reported on 01/19/2019), Disp: 14 tablet, Rfl: 0 .  tamsulosin (FLOMAX) 0.4 MG CAPS capsule, TAKE 2 CAPSULES BY MOUTH EVERY DAY, Disp: 174 capsule, Rfl: 1 .  Tiotropium Bromide Monohydrate (SPIRIVA RESPIMAT) 2.5 MCG/ACT AERS, INHALE 2.5 MCG INTO THE LUNGS 2 (TWO) TIMES DAILY., Disp: 1  Inhaler, Rfl: 5 .  warfarin (COUMADIN) 2 MG tablet, TAKE 1 TABLET WITH 5 MG TABLET DAILY AS DIRECTED BY COUMADIN CLINIC, Disp: 90 tablet, Rfl: 0 .  warfarin (COUMADIN) 5 MG tablet, TAKE 1 TABLET DAILY AS DIRECTED WITH 2 MG TABLET, Disp: 90 tablet, Rfl: 1  Past Medical History: Past Medical History:  Diagnosis Date  . Aortic aneurysm, thoracic ([place])   . Aortic valve disease   . Arthritis   . BPH (benign prostatic hyperplasia)   . COPD (chronic obstructive pulmonary disease) ([place])   . GERD (gastroesophageal reflux disease)   . HTN (hypertension)   . Hypercholesterolemia   . Stroke ([place])   . TIA (transient ischemic attack)     Tobacco Use: Social History   Tobacco Use  Smoking Status Never Smoker  Smokeless Tobacco Never Used    Labs: Recent Review Flowsheet Data    Labs for ITP Cardiac and Pulmonary Rehab Latest Ref Rng & Units 06/04/2012 11/14/2014 10/03/2015 12/10/2016 12/22/2016   Cholestrol 0 - 200 mg/dL 247(H) 254(H) 174 253(H) -   LDLCALC 0 - 99 mg/dL 169(H) - 87 185(H) -   LDLDIRECT mg/dL - 172.0 - - -   HDL >39.00 mg/dL 45 48.50 47.20 45.00 -   Trlycerides 0.0 - 149.0 mg/dL 165(H) 202.0(H) 195.0(H) 112.0 -   Hemoglobin A1c 4.6 - 6.5 % 5.9(H) - 6.2 - 6.1       Pulmonary Assessment Scores:  Pulmonary Assessment Scores    Row Name 09/22/18 1231         ADL UCSD   SOB Score total  62     Rest  1     Walk  2     Stairs  4     Bath  4     Dress  2     Shop  2       CAT Score   CAT Score  14       mMRC Score   mMRC Score  1        UCSD: Self-administered rating of dyspnea associated with activities of daily living (ADLs) 6-point scale (0 = "not at all" to 5 = "maximal or unable to do because of breathlessness")  Scoring Scores range from 0 to 120.  Minimally important difference is 5 units  CAT: CAT can identify the health impairment of COPD patients and is better correlated with disease progression.  CAT has a scoring range of zero to 40. The CAT score  is classified into four groups of low (less than 10), medium (10 - 20), high (21-30) and very high (31-40) based on the impact level of disease on health status. A CAT score over 10 suggests significant symptoms.  A worsening CAT score could be explained by an exacerbation, poor medication adherence, poor inhaler technique, or progression of COPD or comorbid conditions.  CAT MCID is 2 points  mMRC: mMRC (Modified Medical Research Council) Dyspnea Scale is used to assess the degree of baseline functional disability in patients of respiratory disease due to dyspnea. No minimal important difference is established. A decrease in score of 1 point or greater is considered a positive change.   Pulmonary Function Assessment:   Exercise Target Goals: Exercise Program Goal: Individual exercise prescription set using results from initial 6 min walk test and THRR while considering  patient's activity barriers and safety.   Exercise Prescription Goal: Initial exercise prescription builds to 30-45 minutes a day of aerobic activity, 2-3 days per week.  Home exercise guidelines will be given to patient during program as part of exercise prescription that the participant will acknowledge.  Activity Barriers & Risk Stratification: Activity Barriers & Cardiac Risk Stratification - 09/21/18 0810      Activity Barriers & Cardiac Risk Stratification   Activity Barriers  Deconditioning;Muscular Weakness;Joint Problems;Other (comment)    Comments  Hx CVA,  r knee pain       6 Minute Walk: 6 Minute Walk    Row Name 09/22/18 1220         6 Minute Walk   Phase  Initial     Distance  1765 feet     Walk Time  6 minutes     # of Rest Breaks  0     MPH  3.34     METS  4.06     RPE  17     Perceived Dyspnea   3.5     VO2 Peak  14.21     Symptoms  No     Resting HR  78 bpm     Resting BP  102/58     Resting Oxygen Saturation   94 %     Exercise Oxygen Saturation  during 6 min walk  93 %     Max Ex. HR   121 bpm     Max Ex. BP  138/58     2 Minute Post BP  122/58  Interval HR   1 Minute HR  105     2 Minute HR  113     3 Minute HR  116     4 Minute HR  112     5 Minute HR  110     6 Minute HR  121     2 Minute Post HR  97     Interval Heart Rate?  Yes       Interval Oxygen   Interval Oxygen?  Yes     Baseline Oxygen Saturation %  94 %     1 Minute Oxygen Saturation %  93 %     1 Minute Liters of Oxygen  0 L     2 Minute Oxygen Saturation %  94 %     2 Minute Liters of Oxygen  0 L     3 Minute Oxygen Saturation %  94 %     3 Minute Liters of Oxygen  0 L     4 Minute Oxygen Saturation %  94 %     4 Minute Liters of Oxygen  0 L     5 Minute Oxygen Saturation %  95 %     5 Minute Liters of Oxygen  0 L     6 Minute Oxygen Saturation %  95 %     6 Minute Liters of Oxygen  0 L     2 Minute Post Oxygen Saturation %  96 %     2 Minute Post Liters of Oxygen  0 L       Oxygen Initial Assessment: Oxygen Initial Assessment - 09/21/18 0810      Home Oxygen   Home Oxygen Device  None    Sleep Oxygen Prescription  None    Home Exercise Oxygen Prescription  None    Home at Rest Exercise Oxygen Prescription  None      Initial 6 min Walk   Oxygen Used  None      Program Oxygen Prescription   Program Oxygen Prescription  None      Intervention   Short Term Goals  To learn and demonstrate proper pursed lip breathing techniques or other breathing techniques.;To learn and understand importance of monitoring SPO2 with pulse oximeter and demonstrate accurate use of the pulse oximeter.;To learn and understand importance of maintaining oxygen saturations>88%;To learn and demonstrate proper use of respiratory medications    Long  Term Goals  Verbalizes importance of monitoring SPO2 with pulse oximeter and return demonstration;Exhibits proper breathing techniques, such as pursed lip breathing or other method taught during program session;Demonstrates proper use of MDI's;Compliance with  respiratory medication;Maintenance of O2 saturations>88%       Oxygen Re-Evaluation: Oxygen Re-Evaluation    Row Name 09/24/18 0932 10/18/18 0926           Program Oxygen Prescription   Program Oxygen Prescription  --  None        Home Oxygen   Home Oxygen Device  --  None      Sleep Oxygen Prescription  --  None      Home Exercise Oxygen Prescription  --  None      Home at Rest Exercise Oxygen Prescription  --  None        Goals/Expected Outcomes   Short Term Goals  To learn and understand importance of monitoring SPO2 with pulse oximeter and demonstrate accurate use of the pulse oximeter.;To learn and understand importance of maintaining oxygen saturations>88%;To  learn and demonstrate proper pursed lip breathing techniques or other breathing techniques.  To learn and understand importance of monitoring SPO2 with pulse oximeter and demonstrate accurate use of the pulse oximeter.;To learn and understand importance of maintaining oxygen saturations>88%;To learn and demonstrate proper pursed lip breathing techniques or other breathing techniques.;To learn and demonstrate proper use of respiratory medications      Long  Term Goals  Verbalizes importance of monitoring SPO2 with pulse oximeter and return demonstration;Maintenance of O2 saturations>88%;Exhibits proper breathing techniques, such as pursed lip breathing or other method taught during program session  Verbalizes importance of monitoring SPO2 with pulse oximeter and return demonstration;Maintenance of O2 saturations>88%;Exhibits proper breathing techniques, such as pursed lip breathing or other method taught during program session;Compliance with respiratory medication;Demonstrates proper use of MDI's      Comments  Reviewed PLB technique with pt.  Talked about how it work and it's important to maintaining his exercise saturations.  He has really appreciated that difference that PLB has made for him.  Since using it more consistently,  he has not needed to use his rescue inhaler.      Goals/Expected Outcomes  Short: Become more profiecient at using PLB.   Long: Become independent at using PLB.  Short: Continue to use PLB regularly.  Long: Continue to improve breathing.         Oxygen Discharge (Final Oxygen Re-Evaluation): Oxygen Re-Evaluation - 10/18/18 0926      Program Oxygen Prescription   Program Oxygen Prescription  None      Home Oxygen   Home Oxygen Device  None    Sleep Oxygen Prescription  None    Home Exercise Oxygen Prescription  None    Home at Rest Exercise Oxygen Prescription  None      Goals/Expected Outcomes   Short Term Goals  To learn and understand importance of monitoring SPO2 with pulse oximeter and demonstrate accurate use of the pulse oximeter.;To learn and understand importance of maintaining oxygen saturations>88%;To learn and demonstrate proper pursed lip breathing techniques or other breathing techniques.;To learn and demonstrate proper use of respiratory medications    Long  Term Goals  Verbalizes importance of monitoring SPO2 with pulse oximeter and return demonstration;Maintenance of O2 saturations>88%;Exhibits proper breathing techniques, such as pursed lip breathing or other method taught during program session;Compliance with respiratory medication;Demonstrates proper use of MDI's    Comments  He has really appreciated that difference that PLB has made for him.  Since using it more consistently, he has not needed to use his rescue inhaler.    Goals/Expected Outcomes  Short: Continue to use PLB regularly.  Long: Continue to improve breathing.       Initial Exercise Prescription: Initial Exercise Prescription - 09/22/18 1200      Date of Initial Exercise RX and Referring Provider   Date  09/22/18    Referring Provider  Sonnenberg      Treadmill   MPH  3.3    Grade  1    Minutes  15    METs  4      Elliptical   Level  1    Speed  3    Minutes  15      REL-XR   Level  3     Speed  50    Minutes  15    METs  4      T5 Nustep   Level  3    SPM  80    Minutes  15    METs  4      Prescription Details   Frequency (times per week)  3    Duration  Progress to 30 minutes of continuous aerobic without signs/symptoms of physical distress      Intensity   THRR 40-80% of Max Heartrate  107-136    Ratings of Perceived Exertion  11-15    Perceived Dyspnea  0-4      Progression   Progression  Continue to progress workloads to maintain intensity without signs/symptoms of physical distress.      Resistance Training   Training Prescription  Yes    Weight  5 lb    Reps  10-15       Perform Capillary Blood Glucose checks as needed.  Exercise Prescription Changes: Exercise Prescription Changes    Row Name 09/22/18 1200 10/05/18 1400 10/18/18 0900 11/16/18 1400       Response to Exercise   Blood Pressure (Admit)  102/58  110/70  --  128/60    Blood Pressure (Exercise)  138/58  146/80  --  140/60    Blood Pressure (Exit)  122/58  126/64  --  124/72    Heart Rate (Admit)  78 bpm  80 bpm  --  77 bpm    Heart Rate (Exercise)  121 bpm  97 bpm  --  104 bpm    Heart Rate (Exit)  97 bpm  84 bpm  --  98 bpm    Oxygen Saturation (Admit)  94 %  95 %  --  97 %    Oxygen Saturation (Exercise)  93 %  94 %  --  95 %    Oxygen Saturation (Exit)  96 %  96 %  --  95 %    Rating of Perceived Exertion (Exercise)  17  12  --  13    Perceived Dyspnea (Exercise)  3.5  3  --  3    Symptoms  none  none  --  none    Duration  --  Continue with 30 min of aerobic exercise without signs/symptoms of physical distress.  --  Continue with 30 min of aerobic exercise without signs/symptoms of physical distress.    Intensity  --  THRR unchanged  --  THRR unchanged      Progression   Progression  --  Continue to progress workloads to maintain intensity without signs/symptoms of physical distress.  --  Continue to progress workloads to maintain intensity without signs/symptoms of physical  distress.    Average METs  --  2.91  --  3.42      Resistance Training   Training Prescription  --  Yes  --  Yes    Weight  --  5 lbs  --  5 lbs    Reps  --  10-15  --  10-15      Interval Training   Interval Training  --  No  --  No      Treadmill   MPH  --  2.8  --  3.2    Grade  --  1  --  0    Minutes  --  15  --  15    METs  --  3.53  --  3.45      Elliptical   Level  --  1  --  6    Speed  --  2.5  --  3.5    Minutes  --  15  --  15      REL-XR   Level  --  4  --  9    Minutes  --  15  --  15    METs  --  3.1  --  4.3      T5 Nustep   Level  --  3  --  8    Minutes  --  15  --  15    METs  --  3.1  --  2.5      Home Exercise Plan   Plans to continue exercise at  --  --  Home (comment) walking  Home (comment) walking    Frequency  --  --  Add 2 additional days to program exercise sessions.  Add 2 additional days to program exercise sessions.    Initial Home Exercises Provided  --  --  10/07/18  10/07/18       Exercise Comments: Exercise Comments    Row Name 09/24/18 0931           Exercise Comments  First full day of exercise!  Patient was oriented to gym and equipment including functions, settings, policies, and procedures.  Patient's individual exercise prescription and treatment plan were reviewed.  All starting workloads were established based on the results of the 6 minute walk test done at initial orientation visit.  The plan for exercise progression was also introduced and progression will be customized based on patient's performance and goals.          Exercise Goals and Review: Exercise Goals    Row Name 09/22/18 1227             Exercise Goals   Increase Physical Activity  Yes       Intervention  Provide advice, education, support and counseling about physical activity/exercise needs.;Develop an individualized exercise prescription for aerobic and resistive training based on initial evaluation findings, risk stratification, comorbidities and  participant's personal goals.       Expected Outcomes  Short Term: Attend rehab on a regular basis to increase amount of physical activity.;Long Term: Add in home exercise to make exercise part of routine and to increase amount of physical activity.;Long Term: Exercising regularly at least 3-5 days a week.       Increase Strength and Stamina  Yes       Intervention  Provide advice, education, support and counseling about physical activity/exercise needs.;Develop an individualized exercise prescription for aerobic and resistive training based on initial evaluation findings, risk stratification, comorbidities and participant's personal goals.       Expected Outcomes  Short Term: Increase workloads from initial exercise prescription for resistance, speed, and METs.;Short Term: Perform resistance training exercises routinely during rehab and add in resistance training at home;Long Term: Improve cardiorespiratory fitness, muscular endurance and strength as measured by increased METs and functional capacity (6MWT)       Able to understand and use rate of perceived exertion (RPE) scale  Yes       Intervention  Provide education and explanation on how to use RPE scale       Expected Outcomes  Short Term: Able to use RPE daily in rehab to express subjective intensity level;Long Term:  Able to use RPE to guide intensity level when exercising independently       Able to understand and use Dyspnea scale  Yes       Intervention  Provide education and explanation on how to use Dyspnea scale  Expected Outcomes  Short Term: Able to use Dyspnea scale daily in rehab to express subjective sense of shortness of breath during exertion;Long Term: Able to use Dyspnea scale to guide intensity level when exercising independently       Knowledge and understanding of Target Heart Rate Range (THRR)  Yes       Intervention  Provide education and explanation of THRR including how the numbers were predicted and where they are  located for reference       Expected Outcomes  Short Term: Able to state/look up THRR;Short Term: Able to use daily as guideline for intensity in rehab;Long Term: Able to use THRR to govern intensity when exercising independently       Able to check pulse independently  Yes       Intervention  Provide education and demonstration on how to check pulse in carotid and radial arteries.;Review the importance of being able to check your own pulse for safety during independent exercise       Expected Outcomes  Short Term: Able to explain why pulse checking is important during independent exercise;Long Term: Able to check pulse independently and accurately       Understanding of Exercise Prescription  Yes       Intervention  Provide education, explanation, and written materials on patient's individual exercise prescription       Expected Outcomes  Short Term: Able to explain program exercise prescription;Long Term: Able to explain home exercise prescription to exercise independently          Exercise Goals Re-Evaluation : Exercise Goals Re-Evaluation    Row Name 09/24/18 0931 09/27/18 0933 10/05/18 1435 10/18/18 0921 11/16/18 1452     Exercise Goal Re-Evaluation   Exercise Goals Review  Increase Physical Activity;Increase Strength and Stamina;Able to understand and use rate of perceived exertion (RPE) scale;Knowledge and understanding of Target Heart Rate Range (THRR);Able to understand and use Dyspnea scale;Understanding of Exercise Prescription  Increase Physical Activity;Increase Strength and Stamina;Able to understand and use rate of perceived exertion (RPE) scale;Knowledge and understanding of Target Heart Rate Range (THRR);Able to understand and use Dyspnea scale;Able to check pulse independently;Understanding of Exercise Prescription  Increase Physical Activity;Increase Strength and Stamina;Understanding of Exercise Prescription  Increase Physical Activity;Increase Strength and Stamina;Understanding  of Exercise Prescription  Increase Physical Activity;Increase Strength and Stamina;Understanding of Exercise Prescription   Comments  Reviewed RPE scale, THR and program prescription with pt today.  Pt voiced understanding and was given a copy of goals to take home.  --  Rush Landmark is off to a good start in rehab.  He is already up to level 4 on the XR.  He is pleased to be back in the program to build his strength for surgery. We will continue to monitor his progres.  Rush Landmark is doing well in rehab.  He finds the standing exercise to be harder than the seated.  It is getting easier somewhat.  He is slowly regaining his strength.  He is walking at home on is off days.  Rush Landmark had been doing well in rehab.  He has tested postive for COVID-19 and will be out for 2 weeks.  He had worked his way up to level 9 on the XR.  We will continue to monitor his progress.   Expected Outcomes  Short: Use RPE daily to regulate intensity. Long: Follow program prescription in THR.  --  Short: Review home exericse guidelines. Long: Continue to follow program prescription.  Short: Continue to attend regulalry.  Long:Continue to build strength for surgery.  Short: Recover from [place] and get cleared to return to rehab.  Long: Continue to rebuild stamina.   [place] Name 01/11/19 1547             Exercise Goal Re-Evaluation   Comments  Out since last reveiw.          Discharge Exercise Prescription (Final Exercise Prescription Changes): Exercise Prescription Changes - 11/16/18 1400      Response to Exercise   Blood Pressure (Admit)  128/60    Blood Pressure (Exercise)  140/60    Blood Pressure (Exit)  124/72    Heart Rate (Admit)  77 bpm    Heart Rate (Exercise)  104 bpm    Heart Rate (Exit)  98 bpm    Oxygen Saturation (Admit)  97 %    Oxygen Saturation (Exercise)  95 %    Oxygen Saturation (Exit)  95 %    Rating of Perceived Exertion (Exercise)  13    Perceived Dyspnea (Exercise)  3    Symptoms  none    Duration  Continue  with 30 min of aerobic exercise without signs/symptoms of physical distress.    Intensity  THRR unchanged      Progression   Progression  Continue to progress workloads to maintain intensity without signs/symptoms of physical distress.    Average METs  3.42      Resistance Training   Training Prescription  Yes    Weight  5 lbs    Reps  10-15      Interval Training   Interval Training  No      Treadmill   MPH  3.2    Grade  0    Minutes  15    METs  3.45      Elliptical   Level  6    Speed  3.5    Minutes  15      REL-XR   Level  9    Minutes  15    METs  4.3      T5 Nustep   Level  8    Minutes  15    METs  2.5      Home Exercise Plan   Plans to continue exercise at  Home (comment)   walking   Frequency  Add 2 additional days to program exercise sessions.    Initial Home Exercises Provided  10/07/18       Nutrition:  Target Goals: Understanding of nutrition guidelines, daily intake of sodium '1500mg'$ , cholesterol '200mg'$ , calories 30% from fat and 7% or less from saturated fats, daily to have 5 or more servings of fruits and vegetables.  Biometrics: Pre Biometrics - 09/22/18 1228      Pre Biometrics   Height  6' 1.25" (1.861 m)    Weight  216 lb 14.4 oz (98.4 kg)    BMI (Calculated)  28.41        Nutrition Therapy Plan and Nutrition Goals: Nutrition Therapy & Goals - 09/22/18 1133      Nutrition Therapy   Drug/Food Interactions  Coumadin/Vit K    Protein (specify units)  80g    Fiber  30 grams    Whole Grain Foods  3 servings    Saturated Fats  12 max. grams    Fruits and Vegetables  5 servings/day    Sodium  1.5 grams      Personal Nutrition Goals   Nutrition Goal  ST: include one  plant based protein snack; to increase fiber and protein LT: lose ~10 lbs, get stronger for surgery    Comments  Pt reports eating 5 health bars for weight loss (12g pro, 100kcal each) with s lean protein and vegetable for the day - discussed how vit/min from whole  foods was optimal and that was insuffient kcals. Gave weight expectations and how his pro/kcal needs are higher due to his COPD. Pt reports lactose intolerance and only has dairy on occasion. Pt reports baking/grilling fish or chicken. Pt reports eating limited "junk foods" and even processed foods. In am will have coffee, sausage, eggs, and sometimes grits. Pt will not usually have lunch "not eating doesn't bother me", but will sometimes have a protein shake which is high in prootein but fairly low in calories (told pt that could make a good snack. Pt reports that he needs medication and the protein shake to stay regular, discussed fiber needs. Discussed HH eating. Pt reports that lasix is prescribed but has not been used (was prescribed over telehealth just in case).      Intervention Plan   Intervention  Prescribe, educate and counsel regarding individualized specific dietary modifications aiming towards targeted core components such as weight, hypertension, lipid management, diabetes, heart failure and other comorbidities.;Nutrition handout(s) given to patient.    Expected Outcomes  Short Term Goal: Understand basic principles of dietary content, such as calories, fat, sodium, cholesterol and nutrients.;Short Term Goal: A plan has been developed with personal nutrition goals set during dietitian appointment.;Long Term Goal: Adherence to prescribed nutrition plan.       Nutrition Assessments: Nutrition Assessments - 09/22/18 1144      MEDFICTS Scores   Pre Score  26       Nutrition Goals Re-Evaluation:   Nutrition Goals Discharge (Final Nutrition Goals Re-Evaluation):   Psychosocial: Target Goals: Acknowledge presence or absence of significant depression and/or stress, maximize coping skills, provide positive support system. Participant is able to verbalize types and ability to use techniques and skills needed for reducing stress and depression.   Initial Review & Psychosocial  Screening: Initial Psych Review & Screening - 09/21/18 0814      Initial Review   Current issues with  History of Depression;Current Sleep Concerns   He sleeps about 5-6 hours a night, uses CBD oil to help, gets up two to three times to use bathroom and is often able to get back to sleep     Family Dynamics   Good Support System?  Yes   Wife [name]), daughter  (Amy), son Corene Cornea), 3 grandson, and good relationships wiht in-law   Comments  Attends a great church but has not been since [place], will wait til after surgery      Barriers   Psychosocial barriers to participate in program  There are no identifiable barriers or psychosocial needs.;The patient should benefit from training in stress management and relaxation.      Screening Interventions   Interventions  Encouraged to exercise;To provide support and resources with identified psychosocial needs    Expected Outcomes  Long Term Goal: Stressors or current issues are controlled or eliminated.;Short Term goal: Utilizing psychosocial counselor, staff and physician to assist with identification of specific Stressors or current issues interfering with healing process. Setting desired goal for each stressor or current issue identified.;Short Term goal: Identification and review with participant of any Quality of Life or Depression concerns found by scoring the questionnaire.;Long Term goal: The participant improves quality of Life and PHQ9  Scores as seen by post scores and/or verbalization of changes       Quality of Life Scores:  Scores of 19 and below usually indicate a poorer quality of life in these areas.  A difference of  2-3 points is a clinically meaningful difference.  A difference of 2-3 points in the total score of the Quality of Life Index has been associated with significant improvement in overall quality of life, self-image, physical symptoms, and general health in studies assessing change in quality of life.  PHQ-9: Recent Review  Flowsheet Data    Depression screen [facility] 2/9 01/19/2019 10/18/2018 09/22/2018 09/10/2018 07/14/2018   Decreased Interest 0 0 1 0 0   Down, Depressed, Hopeless 0 0 1 0 0   PHQ - 2 Score 0 0 2 0 0   Altered sleeping - 1 2 - 0   Tired, decreased energy - 2 2 - 0   Change in appetite - 2 0 - 0   Feeling bad or failure about yourself  - 0 0 - 0   Trouble concentrating - 2 1 - 0   Moving slowly or fidgety/restless - 1 0 - 0   Suicidal thoughts - 0 0 - 0   PHQ-9 Score - 8 7 - 0   Difficult doing work/chores - Not difficult at all Not difficult at all - Not difficult at all     Interpretation of Total Score  Total Score Depression Severity:  1-4 = Minimal depression, 5-9 = Mild depression, 10-14 = Moderate depression, 15-19 = Moderately severe depression, 20-27 = Severe depression   Psychosocial Evaluation and Intervention:   Psychosocial Re-Evaluation: Psychosocial Re-Evaluation    [place] Name 10/18/18 0923             Psychosocial Re-Evaluation   Current issues with  History of Depression;Current Sleep Concerns;Current Stress Concerns       Comments  Rush Landmark is doing well in rehab.  He is preparing for a AAA repair surgery and his doctor wanted him to build up his strength first.  He has not had any depression symptoms.  He is sleeping better now and not taking anything for it.  His PHQ score went up slightly, but more related to energy and sleep.  He is feeling better overall.       Expected Outcomes  Short: Continue to improve Long: Continue to feel better and stay positive.          Psychosocial Discharge (Final Psychosocial Re-Evaluation): Psychosocial Re-Evaluation - 10/18/18 0923      Psychosocial Re-Evaluation   Current issues with  History of Depression;Current Sleep Concerns;Current Stress Concerns    Comments  Rush Landmark is doing well in rehab.  He is preparing for a AAA repair surgery and his doctor wanted him to build up his strength first.  He has not had any depression symptoms.  He  is sleeping better now and not taking anything for it.  His PHQ score went up slightly, but more related to energy and sleep.  He is feeling better overall.    Expected Outcomes  Short: Continue to improve Long: Continue to feel better and stay positive.       Education: Education Goals: Education classes will be provided on a weekly basis, covering required topics. Participant will state understanding/return demonstration of topics presented.  Learning Barriers/Preferences: Learning Barriers/Preferences - 09/21/18 0813      Learning Barriers/Preferences   Learning Barriers  Sight   glasses   Learning Preferences  None       Education Topics:  Initial Evaluation Education: - Verbal, written and demonstration of respiratory meds, oximetry and breathing techniques. Instruction on use of nebulizers and MDIs and importance of monitoring MDI activations.   Pulmonary Rehab from 11/24/2016 in [facility] Cardiac and Pulmonary Rehab  Date  08/12/16  Educator  SB  Instruction Review Code (retired)  2- meets goals/outcomes      General Nutrition Guidelines/Fats and Fiber: -Group instruction provided by verbal, written material, models and posters to present the general guidelines for heart healthy nutrition. Gives an explanation and review of dietary fats and fiber.   Pulmonary Rehab from 11/24/2016 in [facility] Cardiac and Pulmonary Rehab  Date  11/17/16  Educator  CR  Instruction Review Code  1- Verbalizes Understanding      Controlling Sodium/Reading Food Labels: -Group verbal and written material supporting the discussion of sodium use in heart healthy nutrition. Review and explanation with models, verbal and written materials for utilization of the food label.   Pulmonary Rehab from 11/24/2016 in [facility] Cardiac and Pulmonary Rehab  Date  11/24/16  Educator  CR  Instruction Review Code  5- Refused Teaching      Exercise Physiology & General Exercise Guidelines: - Group verbal and written  instruction with models to review the exercise physiology of the cardiovascular system and associated critical values. Provides general exercise guidelines with specific guidelines to those with heart or lung disease.    Pulmonary Rehab from 11/24/2016 in [facility] Cardiac and Pulmonary Rehab  Date  11/07/16  Educator  Nada Maclachlan, EP  Instruction Review Code  2- Demonstrated Understanding      Aerobic Exercise & Resistance Training: - Gives group verbal and written instruction on the various components of exercise. Focuses on aerobic and resistive training programs and the benefits of this training and how to safely progress through these programs.   Pulmonary Rehab from 11/24/2016 in [facility] Cardiac and Pulmonary Rehab  Date  08/29/16  Educator  [facility]  Instruction Review Code  2- Demonstrated Understanding      Flexibility, Balance, Mind/Body Relaxation: Provides group verbal/written instruction on the benefits of flexibility and balance training, including mind/body exercise modes such as yoga, pilates and tai chi.  Demonstration and skill practice provided.   Pulmonary Rehab from 11/24/2016 in [facility] Cardiac and Pulmonary Rehab  Date  09/17/16  Educator  [facility]  Instruction Review Code  2- Demonstrated Understanding      Stress and Anxiety: - Provides group verbal and written instruction about the health risks of elevated stress and causes of high stress.  Discuss the correlation between heart/lung disease and anxiety and treatment options. Review healthy ways to manage with stress and anxiety.   Pulmonary Rehab from 11/24/2016 in [facility] Cardiac and Pulmonary Rehab  Date  10/22/16  Educator  [facility]  Instruction Review Code  1- Verbalizes Understanding      Depression: - Provides group verbal and written instruction on the correlation between heart/lung disease and depressed mood, treatment options, and the stigmas associated with seeking treatment.   Pulmonary Rehab from 11/24/2016 in [facility]  Cardiac and Pulmonary Rehab  Date  09/24/16  Educator  [facility]  Instruction Review Code (retired)  2- meets goals/outcomes      Exercise & Equipment Safety: - Individual verbal instruction and demonstration of equipment use and safety with use of the equipment.   Cardiac Rehab from 09/22/2018 in [facility] Cardiac and Pulmonary Rehab  Date  09/22/18  Educator  AS  Instruction Review Code  1- Verbalizes Understanding      Infection Prevention: - Provides verbal and written material to individual with discussion of infection control including proper hand washing and proper equipment cleaning during exercise session.   Cardiac Rehab from 09/22/2018 in [facility] Cardiac and Pulmonary Rehab  Date  09/22/18  Educator  AS  Instruction Review Code  1- Verbalizes Understanding      Falls Prevention: - Provides verbal and written material to individual with discussion of falls prevention and safety.   Cardiac Rehab from 09/22/2018 in [facility] Cardiac and Pulmonary Rehab  Date  09/22/18  Educator  AS  Instruction Review Code  1- Verbalizes Understanding      Diabetes: - Individual verbal and written instruction to review signs/symptoms of diabetes, desired ranges of glucose level fasting, after meals and with exercise. Advice that pre and post exercise glucose checks will be done for 3 sessions at entry of program.   Chronic Lung Diseases: - Group verbal and written instruction to review updates, respiratory medications, advancements in procedures and treatments. Discuss use of supplemental oxygen including available portable oxygen systems, continuous and intermittent flow rates, concentrators, personal use and safety guidelines. Review proper use of inhaler and spacers. Provide informative websites for self-education.    Pulmonary Rehab from 11/24/2016 in [facility] Cardiac and Pulmonary Rehab  Date  11/05/16  Educator  [facility]  Instruction Review Code  1- Verbalizes Understanding      Energy Conservation: -  Provide group verbal and written instruction for methods to conserve energy, plan and organize activities. Instruct on pacing techniques, use of adaptive equipment and posture/positioning to relieve shortness of breath.   Pulmonary Rehab from 11/24/2016 in [facility] Cardiac and Pulmonary Rehab  Date  10/15/16  Educator  [facility]  Instruction Review Code  1- Verbalizes Understanding      Triggers and Exacerbations: - Group verbal and written instruction to review types of environmental triggers and ways to prevent exacerbations. Discuss weather changes, air quality and the benefits of nasal washing. Review warning signs and symptoms to help prevent infections. Discuss techniques for effective airway clearance, coughing, and vibrations.   AED/CPR: - Group verbal and written instruction with the use of models to demonstrate the basic use of the AED with the basic ABC's of resuscitation.   Anatomy and Physiology of the Lungs: - Group verbal and written instruction with the use of models to provide basic lung anatomy and physiology related to function, structure and complications of lung disease.   Pulmonary Rehab from 11/24/2016 in [facility] Cardiac and Pulmonary Rehab  Date  10/08/16  Educator  [facility]  Instruction Review Code  1- Verbalizes Understanding      Anatomy & Physiology of the Heart: - Group verbal and written instruction and models provide basic cardiac anatomy and physiology, with the coronary electrical and arterial systems. Review of Valvular disease and Heart Failure   Cardiac Medications: - Group verbal and written instruction to review commonly prescribed medications for heart disease. Reviews the medication, class of the drug, and side effects.   Pulmonary Rehab from 11/24/2016 in [facility] Cardiac and Pulmonary Rehab  Date  10/10/16  Educator  C. EnterkinRN  Instruction Review Code  1- Verbalizes Understanding      Know Your Numbers and Risk Factors: -Group verbal and written  instruction about important numbers in your health.  Discussion of what are risk factors and how they play a role in the disease process.  Review of Cholesterol, Blood Pressure, Diabetes,  and BMI and the role they play in your overall health.   Sleep Hygiene: -Provides group verbal and written instruction about how sleep can affect your health.  Define sleep hygiene, discuss sleep cycles and impact of sleep habits. Review good sleep hygiene tips.    Other: -Provides group and verbal instruction on various topics (see comments)    Knowledge Questionnaire Score: Knowledge Questionnaire Score - 09/22/18 1235      Knowledge Questionnaire Score   Pre Score  15/18        Core Components/Risk Factors/Patient Goals at Admission: Personal Goals and Risk Factors at Admission - 09/22/18 1235      Core Components/Risk Factors/Patient Goals on Admission    Weight Management  Weight Loss;Yes    Intervention  Weight Management: Develop a combined nutrition and exercise program designed to reach desired caloric intake, while maintaining appropriate intake of nutrient and fiber, sodium and fats, and appropriate energy expenditure required for the weight goal.;Weight Management: Provide education and appropriate resources to help participant work on and attain dietary goals.    Admit Weight  216 lb 14.4 oz (98.4 kg)    Goal Weight: Short Term  219 lb (99.3 kg)    Goal Weight: Long Term  209 lb (94.8 kg)    Expected Outcomes  Short Term: Continue to assess and modify interventions until short term weight is achieved;Long Term: Adherence to nutrition and physical activity/exercise program aimed toward attainment of established weight goal;Weight Loss: Understanding of general recommendations for a balanced deficit meal plan, which promotes 1-2 lb weight loss per week and includes a negative energy balance of [phone] kcal/d;Understanding recommendations for meals to include 15-35% energy as protein, 25-35%  energy from fat, 35-60% energy from carbohydrates, less than 249m of dietary cholesterol, 20-35 gm of total fiber daily;Understanding of distribution of calorie intake throughout the day with the consumption of 4-5 meals/snacks    Improve shortness of breath with ADL's  Yes    Intervention  Provide education, individualized exercise plan and daily activity instruction to help decrease symptoms of SOB with activities of daily living.    Expected Outcomes  Short Term: Improve cardiorespiratory fitness to achieve a reduction of symptoms when performing ADLs;Long Term: Be able to perform more ADLs without symptoms or delay the onset of symptoms    Intervention  Provide education on lifestyle modifcations including regular physical activity/exercise, weight management, moderate sodium restriction and increased consumption of fresh fruit, vegetables, and low fat dairy, alcohol moderation, and smoking cessation.;Monitor prescription use compliance.    Expected Outcomes  Short Term: Continued assessment and intervention until BP is < 140/986mHG in hypertensive participants. < 130/8036mG in hypertensive participants with diabetes, heart failure or chronic kidney disease.;Long Term: Maintenance of blood pressure at goal levels.    Intervention  Provide education and support for participant on nutrition & aerobic/resistive exercise along with prescribed medications to achieve LDL <45m19mDL >40mg57m Expected Outcomes  Short Term: Participant states understanding of desired cholesterol values and is compliant with medications prescribed. Participant is following exercise prescription and nutrition guidelines.;Long Term: Cholesterol controlled with medications as prescribed, with individualized exercise RX and with personalized nutrition plan. Value goals: LDL < 45mg,87m > 40 mg.       Core Components/Risk Factors/Patient Goals Review:  Goals and Risk Factor Review    Row Name 10/18/18 0924              Core Components/Risk Factors/Patient Goals Review  Personal Goals Review  Weight Management/Obesity;Improve shortness of breath with ADL's;Hypertension;Lipids       Review  Bill's weight has plateaued.  He has had house guest and entertaining, now that they are gone, he is hoping to get back on track with his weight loss.  He feels that his breathing is improving.  He checks his pressures daily at home and they are doing well.  He is doing well on his medications.       Expected Outcomes  Short: Get back to eating better for weight loss.  Long: Continue to improve breathing.          Core Components/Risk Factors/Patient Goals at Discharge (Final Review):  Goals and Risk Factor Review - 10/18/18 0924      Core Components/Risk Factors/Patient Goals Review   Personal Goals Review  Weight Management/Obesity;Improve shortness of breath with ADL's;Hypertension;Lipids    Review  Bill's weight has plateaued.  He has had house guest and entertaining, now that they are gone, he is hoping to get back on track with his weight loss.  He feels that his breathing is improving.  He checks his pressures daily at home and they are doing well.  He is doing well on his medications.    Expected Outcomes  Short: Get back to eating better for weight loss.  Long: Continue to improve breathing.       ITP Comments: ITP Comments    Row Name 09/21/18 0824 09/21/18 0832 09/22/18 1218 10/13/18 0721 11/01/18 0943   ITP Comments  Currently on study with [facility] clinic looking at sodium versus statins.  Not sure what he is taking, but numbers seem to be good.  Virtual orientation visit completed.  Documentation for diagnosis can be found in [facility] encounter 8/7.  Completed 6MWT and nutrition eval Initial ITP created and sent to Dr Sabra Heck.  30 Day Review Completed today. Continue with ITP unless changed by Medical Director review.  Bill did report extra shortness of breath this morning while getting ready for the day. His weight  was the same and vital signs were within acceptable ranges. Exercise was tolerated but with a little extra shortness of breath. Rush Landmark was advised to call his doctor and report this change in symptoms.   [place] Name 11/10/18 1107 11/16/18 1452 12/08/18 0716 12/15/18 1404 12/27/18 1450   ITP Comments  30 day review completed. ITP sent to Dr. Emily Filbert, Medical Director of Cardiac and Pulmonary Rehab. Continue with ITP unless changes are made by physician.  Department closed starting 10/2 until further notice by infection prevention and Health at Work teams for [place].  Rush Landmark has tested postive for COVID-19 on 10/7.  He will be out for 14 days.  30 day review completed. Continue with ITP sent to Dr. Emily Filbert, Medical Director of Cardiac and Pulmonary Rehab for review , changes as needed and signature.  Out for medical reason Rush Landmark is scheduled For surgery on an aneurysm and surgeon wants Bill to hold on program until after surgery  Bill had his surgery in two phases.  Phase one was 10/29 and phase two 11/5.  He was discharged home on 11/9.  We will continue to follow for clearance to return to rehab.  Rush Landmark is back in hospital with incisional drainage. He does have a note to return to rehab. We will continue to follow.   [place] Name 01/05/19 1102 01/11/19 1547         ITP Comments  30 day review competed .  ITP sent to Dr Emily Filbert for review, changes as needed and ITP approval signature.  Attempted to follow up with pt.  Unable to get through on phone listed.  Pt has been out since October due to COVID-19 and surgery.  Will send email.         Comments: discharge ITP

## 2019-01-26 NOTE — Progress Notes (Signed)
Discharge Progress Report  Patient Details  Name: Alexander Barton MRN: [id number] Date of Birth: 04/19/1950 Referring Provider:     Cardiac Rehab from 09/22/2018 in [facility]  Referring Provider  Sonnenberg       Number of Visits: 19  Reason for Discharge:  Early Exit:  Lack of attendance  Smoking History:  Social History   Tobacco Use  Smoking Status Never Smoker  Smokeless Tobacco Never Used    Diagnosis:  No diagnosis found.  ADL [facility]: Pulmonary Assessment Scores    Row Name 09/22/18 1231         ADL [facility]   SOB Score total  62     Rest  1     Walk  2     Stairs  4     Bath  4     Dress  2     Shop  2       CAT Score   CAT Score  14       mMRC Score   mMRC Score  1        Initial Exercise Prescription: Initial Exercise Prescription - 09/22/18 1200      Date of Initial Exercise RX and Referring Provider   Date  09/22/18    Referring Provider  Sonnenberg      Treadmill   MPH  3.3    Grade  1    Minutes  15    METs  4      Elliptical   Level  1    Speed  3    Minutes  15      REL-XR   Level  3    Speed  50    Minutes  15    METs  4      T5 Nustep   Level  3    SPM  80    Minutes  15    METs  4      Prescription Details   Frequency (times per week)  3    Duration  Progress to 30 minutes of continuous aerobic without signs/symptoms of physical distress      Intensity   THRR 40-80% of Max Heartrate  107-136    Ratings of Perceived Exertion  11-15    Perceived Dyspnea  0-4      Progression   Progression  Continue to progress workloads to maintain intensity without signs/symptoms of physical distress.      Resistance Training   Training Prescription  Yes    Weight  5 lb    Reps  10-15       Discharge Exercise Prescription (Final Exercise Prescription Changes): Exercise Prescription Changes - 11/16/18 1400      Response to Exercise   Blood Pressure (Admit)  128/60    Blood Pressure (Exercise)   140/60    Blood Pressure (Exit)  124/72    Heart Rate (Admit)  77 bpm    Heart Rate (Exercise)  104 bpm    Heart Rate (Exit)  98 bpm    Oxygen Saturation (Admit)  97 %    Oxygen Saturation (Exercise)  95 %    Oxygen Saturation (Exit)  95 %    Rating of Perceived Exertion (Exercise)  13    Perceived Dyspnea (Exercise)  3    Symptoms  none    Duration  Continue with 30 min of aerobic exercise without signs/symptoms of physical distress.    Intensity  THRR unchanged      Progression   Progression  Continue to progress workloads to maintain intensity without signs/symptoms of physical distress.    Average METs  3.42      Resistance Training   Training Prescription  Yes    Weight  5 lbs    Reps  10-15      Interval Training   Interval Training  No      Treadmill   MPH  3.2    Grade  0    Minutes  15    METs  3.45      Elliptical   Level  6    Speed  3.5    Minutes  15      REL-XR   Level  9    Minutes  15    METs  4.3      T5 Nustep   Level  8    Minutes  15    METs  2.5      Home Exercise Plan   Plans to continue exercise at  Home (comment)   walking   Frequency  Add 2 additional days to program exercise sessions.    Initial Home Exercises Provided  10/07/18       Functional Capacity: 6 Minute Walk    Row Name 09/22/18 1220         6 Minute Walk   Phase  Initial     Distance  1765 feet     Walk Time  6 minutes     # of Rest Breaks  0     MPH  3.34     METS  4.06     RPE  17     Perceived Dyspnea   3.5     VO2 Peak  14.21     Symptoms  No     Resting HR  78 bpm     Resting BP  102/58     Resting Oxygen Saturation   94 %     Exercise Oxygen Saturation  during 6 min walk  93 %     Max Ex. HR  121 bpm     Max Ex. BP  138/58     2 Minute Post BP  122/58       Interval HR   1 Minute HR  105     2 Minute HR  113     3 Minute HR  116     4 Minute HR  112     5 Minute HR  110     6 Minute HR  121     2 Minute Post HR  97     Interval Heart  Rate?  Yes       Interval Oxygen   Interval Oxygen?  Yes     Baseline Oxygen Saturation %  94 %     1 Minute Oxygen Saturation %  93 %     1 Minute Liters of Oxygen  0 L     2 Minute Oxygen Saturation %  94 %     2 Minute Liters of Oxygen  0 L     3 Minute Oxygen Saturation %  94 %     3 Minute Liters of Oxygen  0 L     4 Minute Oxygen Saturation %  94 %     4 Minute Liters of Oxygen  0 L     5 Minute Oxygen Saturation %  95 %     5 Minute Liters of Oxygen  0 L     6 Minute Oxygen Saturation %  95 %     6 Minute Liters of Oxygen  0 L     2 Minute Post Oxygen Saturation %  96 %     2 Minute Post Liters of Oxygen  0 L        Psychological, QOL, Others - Outcomes: PHQ 2/9: Depression screen [facility] 2/9 01/19/2019 10/18/2018 09/22/2018 09/10/2018 07/14/2018  Decreased Interest 0 0 1 0 0  Down, Depressed, Hopeless 0 0 1 0 0  PHQ - 2 Score 0 0 2 0 0  Altered sleeping - 1 2 - 0  Tired, decreased energy - 2 2 - 0  Change in appetite - 2 0 - 0  Feeling bad or failure about yourself  - 0 0 - 0  Trouble concentrating - 2 1 - 0  Moving slowly or fidgety/restless - 1 0 - 0  Suicidal thoughts - 0 0 - 0  PHQ-9 Score - 8 7 - 0  Difficult doing work/chores - Not difficult at all Not difficult at all - Not difficult at all  Some recent data might be hidden    Quality of Life:   Personal Goals: Goals established at orientation with interventions provided to work toward goal. Personal Goals and Risk Factors at Admission - 09/22/18 1235      Core Components/Risk Factors/Patient Goals on Admission    Weight Management  Weight Loss;Yes    Intervention  Weight Management: Develop a combined nutrition and exercise program designed to reach desired caloric intake, while maintaining appropriate intake of nutrient and fiber, sodium and fats, and appropriate energy expenditure required for the weight goal.;Weight Management: Provide education and appropriate resources to help participant work on and  attain dietary goals.    Admit Weight  216 lb 14.4 oz (98.4 kg)    Goal Weight: Short Term  219 lb (99.3 kg)    Goal Weight: Long Term  209 lb (94.8 kg)    Expected Outcomes  Short Term: Continue to assess and modify interventions until short term weight is achieved;Long Term: Adherence to nutrition and physical activity/exercise program aimed toward attainment of established weight goal;Weight Loss: Understanding of general recommendations for a balanced deficit meal plan, which promotes 1-2 lb weight loss per week and includes a negative energy balance of [phone] kcal/d;Understanding recommendations for meals to include 15-35% energy as protein, 25-35% energy from fat, 35-60% energy from carbohydrates, less than 200mg  of dietary cholesterol, 20-35 gm of total fiber daily;Understanding of distribution of calorie intake throughout the day with the consumption of 4-5 meals/snacks    Improve shortness of breath with ADL's  Yes    Intervention  Provide education, individualized exercise plan and daily activity instruction to help decrease symptoms of SOB with activities of daily living.    Expected Outcomes  Short Term: Improve cardiorespiratory fitness to achieve a reduction of symptoms when performing ADLs;Long Term: Be able to perform more ADLs without symptoms or delay the onset of symptoms    Intervention  Provide education on lifestyle modifcations including regular physical activity/exercise, weight management, moderate sodium restriction and increased consumption of fresh fruit, vegetables, and low fat dairy, alcohol moderation, and smoking cessation.;Monitor prescription use compliance.    Expected Outcomes  Short Term: Continued assessment and intervention until BP is < 140/38mm HG in hypertensive participants. < 130/21mm HG in hypertensive participants with diabetes, heart failure  or chronic kidney disease.;Long Term: Maintenance of blood pressure at goal levels.    Intervention  Provide education  and support for participant on nutrition & aerobic/resistive exercise along with prescribed medications to achieve LDL 70mg , HDL >40mg .    Expected Outcomes  Short Term: Participant states understanding of desired cholesterol values and is compliant with medications prescribed. Participant is following exercise prescription and nutrition guidelines.;Long Term: Cholesterol controlled with medications as prescribed, with individualized exercise RX and with personalized nutrition plan. Value goals: LDL < 70mg , HDL > 40 mg.        Personal Goals Discharge: Goals and Risk Factor Review    Row Name 10/18/18 0924             Core Components/Risk Factors/Patient Goals Review   Personal Goals Review  Weight Management/Obesity;Improve shortness of breath with ADL's;Hypertension;Lipids       Review  Bill's weight has plateaued.  He has had house guest and entertaining, now that they are gone, he is hoping to get back on track with his weight loss.  He feels that his breathing is improving.  He checks his pressures daily at home and they are doing well.  He is doing well on his medications.       Expected Outcomes  Short: Get back to eating better for weight loss.  Long: Continue to improve breathing.          Exercise Goals and Review: Exercise Goals    Row Name 09/22/18 1227             Exercise Goals   Increase Physical Activity  Yes       Intervention  Provide advice, education, support and counseling about physical activity/exercise needs.;Develop an individualized exercise prescription for aerobic and resistive training based on initial evaluation findings, risk stratification, comorbidities and participant's personal goals.       Expected Outcomes  Short Term: Attend rehab on a regular basis to increase amount of physical activity.;Long Term: Add in home exercise to make exercise part of routine and to increase amount of physical activity.;Long Term: Exercising regularly at least 3-5 days  a week.       Increase Strength and Stamina  Yes       Intervention  Provide advice, education, support and counseling about physical activity/exercise needs.;Develop an individualized exercise prescription for aerobic and resistive training based on initial evaluation findings, risk stratification, comorbidities and participant's personal goals.       Expected Outcomes  Short Term: Increase workloads from initial exercise prescription for resistance, speed, and METs.;Short Term: Perform resistance training exercises routinely during rehab and add in resistance training at home;Long Term: Improve cardiorespiratory fitness, muscular endurance and strength as measured by increased METs and functional capacity (6MWT)       Able to understand and use rate of perceived exertion (RPE) scale  Yes       Intervention  Provide education and explanation on how to use RPE scale       Expected Outcomes  Short Term: Able to use RPE daily in rehab to express subjective intensity level;Long Term:  Able to use RPE to guide intensity level when exercising independently       Able to understand and use Dyspnea scale  Yes       Intervention  Provide education and explanation on how to use Dyspnea scale       Expected Outcomes  Short Term: Able to use Dyspnea scale daily in rehab to express  subjective sense of shortness of breath during exertion;Long Term: Able to use Dyspnea scale to guide intensity level when exercising independently       Knowledge and understanding of Target Heart Rate Range (THRR)  Yes       Intervention  Provide education and explanation of THRR including how the numbers were predicted and where they are located for reference       Expected Outcomes  Short Term: Able to state/look up THRR;Short Term: Able to use daily as guideline for intensity in rehab;Long Term: Able to use THRR to govern intensity when exercising independently       Able to check pulse independently  Yes       Intervention   Provide education and demonstration on how to check pulse in carotid and radial arteries.;Review the importance of being able to check your own pulse for safety during independent exercise       Expected Outcomes  Short Term: Able to explain why pulse checking is important during independent exercise;Long Term: Able to check pulse independently and accurately       Understanding of Exercise Prescription  Yes       Intervention  Provide education, explanation, and written materials on patient's individual exercise prescription       Expected Outcomes  Short Term: Able to explain program exercise prescription;Long Term: Able to explain home exercise prescription to exercise independently          Exercise Goals Re-Evaluation: Exercise Goals Re-Evaluation    Row Name 09/24/18 0931 09/27/18 0933 10/05/18 1435 10/18/18 0921 11/16/18 1452     Exercise Goal Re-Evaluation   Exercise Goals Review  Increase Physical Activity;Increase Strength and Stamina;Able to understand and use rate of perceived exertion (RPE) scale;Knowledge and understanding of Target Heart Rate Range (THRR);Able to understand and use Dyspnea scale;Understanding of Exercise Prescription  Increase Physical Activity;Increase Strength and Stamina;Able to understand and use rate of perceived exertion (RPE) scale;Knowledge and understanding of Target Heart Rate Range (THRR);Able to understand and use Dyspnea scale;Able to check pulse independently;Understanding of Exercise Prescription  Increase Physical Activity;Increase Strength and Stamina;Understanding of Exercise Prescription  Increase Physical Activity;Increase Strength and Stamina;Understanding of Exercise Prescription  Increase Physical Activity;Increase Strength and Stamina;Understanding of Exercise Prescription   Comments  Reviewed RPE scale, THR and program prescription with pt today.  Pt voiced understanding and was given a copy of goals to take home.  --  Rush Landmark is off to a good start  in rehab.  He is already up to level 4 on the XR.  He is pleased to be back in the program to build his strength for surgery. We will continue to monitor his progres.  Rush Landmark is doing well in rehab.  He finds the standing exercise to be harder than the seated.  It is getting easier somewhat.  He is slowly regaining his strength.  He is walking at home on is off days.  Rush Landmark had been doing well in rehab.  He has tested postive for COVID-19 and will be out for 2 weeks.  He had worked his way up to level 9 on the XR.  We will continue to monitor his progress.   Expected Outcomes  Short: Use RPE daily to regulate intensity. Long: Follow program prescription in THR.  --  Short: Review home exericse guidelines. Long: Continue to follow program prescription.  Short: Continue to attend regulalry.  Long:Continue to build strength for surgery.  Short: Recover from [place] and get cleared to  return to rehab.  Long: Continue to rebuild stamina.   [place] Name 01/11/19 1547             Exercise Goal Re-Evaluation   Comments  Out since last reveiw.          Nutrition & Weight - Outcomes: Pre Biometrics - 09/22/18 1228      Pre Biometrics   Height  6' 1.25" (1.861 m)    Weight  216 lb 14.4 oz (98.4 kg)    BMI (Calculated)  28.41        Nutrition: Nutrition Therapy & Goals - 09/22/18 1133      Nutrition Therapy   Drug/Food Interactions  Coumadin/Vit K    Protein (specify units)  80g    Fiber  30 grams    Whole Grain Foods  3 servings    Saturated Fats  12 max. grams    Fruits and Vegetables  5 servings/day    Sodium  1.5 grams      Personal Nutrition Goals   Nutrition Goal  ST: include one plant based protein snack; to increase fiber and protein LT: lose ~10 lbs, get stronger for surgery    Comments  Pt reports eating 5 health bars for weight loss (12g pro, 100kcal each) with s lean protein and vegetable for the day - discussed how vit/min from whole foods was optimal and that was insuffient kcals. Gave  weight expectations and how his pro/kcal needs are higher due to his COPD. Pt reports lactose intolerance and only has dairy on occasion. Pt reports baking/grilling fish or chicken. Pt reports eating limited "junk foods" and even processed foods. In am will have coffee, sausage, eggs, and sometimes grits. Pt will not usually have lunch "not eating doesn't bother me", but will sometimes have a protein shake which is high in prootein but fairly low in calories (told pt that could make a good snack. Pt reports that he needs medication and the protein shake to stay regular, discussed fiber needs. Discussed HH eating. Pt reports that lasix is prescribed but has not been used (was prescribed over telehealth just in case).      Intervention Plan   Intervention  Prescribe, educate and counsel regarding individualized specific dietary modifications aiming towards targeted core components such as weight, hypertension, lipid management, diabetes, heart failure and other comorbidities.;Nutrition handout(s) given to patient.    Expected Outcomes  Short Term Goal: Understand basic principles of dietary content, such as calories, fat, sodium, cholesterol and nutrients.;Short Term Goal: A plan has been developed with personal nutrition goals set during dietitian appointment.;Long Term Goal: Adherence to prescribed nutrition plan.       Nutrition Discharge: Nutrition Assessments - 09/22/18 1144      MEDFICTS Scores   Pre Score  26       Education Questionnaire Score: Knowledge Questionnaire Score - 09/22/18 1235      Knowledge Questionnaire Score   Pre Score  15/18       Goals reviewed with patient; copy given to patient.

## 2019-02-01 ENCOUNTER — Telehealth: Payer: Self-pay | Admitting: Cardiology

## 2019-02-01 MED ORDER — METOPROLOL SUCCINATE ER 25 MG PO TB24: 25.0000 mg | ORAL_TABLET | Freq: Every day | ORAL | 1 refills | Status: DC

## 2019-02-01 NOTE — Telephone Encounter (Signed)
Pt c/o BP issue: STAT if pt c/o blurred vision, one-sided weakness or slurred speech  1. What are your last 5 BP readings?  77/43 99/51 107/53  2. Are you having any other symptoms (ex. Dizziness, headache, blurred vision, passed out)? Dizziness when standing while patient was at physical therapy today  3. What is your BP issue? Blood pressure readings are all from today. The last 2 readings are from after patient had physical therapy

## 2019-02-01 NOTE — Telephone Encounter (Signed)
Due to symptomatic hypotension, would advise decrease Toprol XL to 25 mg daily. Follow-up with Dr. [name].  Dr. Debara Pickett (DOD)

## 2019-02-01 NOTE — Telephone Encounter (Signed)
Spoke with patient and patient's spouse. Received new orders from Dr. Debara Pickett due to patient having symptomatic hypotension Toprol XL decreased to 25mg  daily.   Patient has follow up on 03/14/2019 with dr. [name]. Patient to call back in if he continues to be symptomatic, may have to add him to an APP for a follow up if symptoms continue.   Patient and spouse verbalized understanding.

## 2019-02-02 ENCOUNTER — Ambulatory Visit (INDEPENDENT_AMBULATORY_CARE_PROVIDER_SITE_OTHER): Payer: Medicare Other | Admitting: Cardiovascular Disease

## 2019-02-02 DIAGNOSIS — Z7901 Long term (current) use of anticoagulants: Secondary | ICD-10-CM

## 2019-02-02 DIAGNOSIS — I359 Nonrheumatic aortic valve disorder, unspecified: Secondary | ICD-10-CM | POA: Diagnosis not present

## 2019-02-02 LAB — POCT INR: INR: 2.5 (ref 2.0–3.0)

## 2019-02-07 ENCOUNTER — Telehealth: Payer: Self-pay | Admitting: Family Medicine

## 2019-02-07 DIAGNOSIS — R29898 Other symptoms and signs involving the musculoskeletal system: Secondary | ICD-10-CM

## 2019-02-07 NOTE — Telephone Encounter (Signed)
Pt's wife called and stated that pt is in need of more therapy. Please call her back @ [phone]

## 2019-02-08 ENCOUNTER — Ambulatory Visit (INDEPENDENT_AMBULATORY_CARE_PROVIDER_SITE_OTHER): Payer: Medicare Other | Admitting: Pharmacist Clinician (PhC)/ Clinical Pharmacy Specialist

## 2019-02-08 DIAGNOSIS — I639 Cerebral infarction, unspecified: Secondary | ICD-10-CM

## 2019-02-08 DIAGNOSIS — Z7901 Long term (current) use of anticoagulants: Secondary | ICD-10-CM

## 2019-02-08 DIAGNOSIS — I359 Nonrheumatic aortic valve disorder, unspecified: Secondary | ICD-10-CM

## 2019-02-08 LAB — POCT INR: INR: 2.1 (ref 2.0–3.0)

## 2019-02-08 NOTE — Telephone Encounter (Signed)
I spoke with the wife and patient and they stated that the patient was on rehab after he left the hospital and was getting physical therapy, he did 6 weeks of physical therapy and the last few sessions was stopped abruptly because his BP was dropping, his medication was adjusted and not they need pulmonary rehab to streghthen the patient, they would like an referral for pulmonary rehab.  Delton Stelle,cma

## 2019-02-08 NOTE — Telephone Encounter (Signed)
Patient need physical therapy through the pulmonary rehab  the wife states because they did the surgery and did not touch his actual heart the insurance will not pay for physical therapy  but if you do the physical therapy through pulmonary rehab he can get the physical therapy.  She spoke to the cardiologist and they bounced it back to you.  Tyquasia Pant,cma

## 2019-02-08 NOTE — Telephone Encounter (Signed)
Please confirm if this was physical therapy or pulmonary rehab?  If it was pulmonary rehab this will need to be determined by his cardiologist or cardiothoracic surgeon.

## 2019-02-09 NOTE — Telephone Encounter (Signed)
Pt called back to follow up on PT.

## 2019-02-09 NOTE — Telephone Encounter (Signed)
Pt called back to follow up on PT. Gae Bon ,cma

## 2019-02-10 NOTE — Telephone Encounter (Signed)
Noted. Please find out what his BP has been recently. I can place the PT referral if his BP has not been low or dropping.

## 2019-02-11 NOTE — Telephone Encounter (Signed)
Pt called back to check on the status of this so therapy can resume

## 2019-02-11 NOTE — Telephone Encounter (Signed)
I called the patient and he stated that since he started taking 1/2 of the metoprolol his diastolic has been between upper 50-60's.  Jalie Eiland,cma

## 2019-02-12 NOTE — Addendum Note (Signed)
Addended by: Leone Haven on: 02/12/2019 09:57 AM   Modules accepted: Orders

## 2019-02-12 NOTE — Telephone Encounter (Signed)
Physical therapy ordered

## 2019-02-16 ENCOUNTER — Ambulatory Visit: Payer: Medicare PPO | Attending: Family Medicine

## 2019-02-16 ENCOUNTER — Other Ambulatory Visit: Payer: Self-pay

## 2019-02-16 DIAGNOSIS — M6281 Muscle weakness (generalized): Secondary | ICD-10-CM | POA: Diagnosis not present

## 2019-02-16 DIAGNOSIS — R262 Difficulty in walking, not elsewhere classified: Secondary | ICD-10-CM | POA: Insufficient documentation

## 2019-02-16 NOTE — Therapy (Signed)
[facility] [address], [place], [address] Phone: [phone]   Fax:  [phone]  Physical Therapy Evaluation  Patient Details  Name: Alexander Barton MRN: [id number] Date of Birth: [birth year] Referring Provider (PT): [name], MD    Encounter Date: 02/16/2019  PT End of Session - 02/16/19 1024    Visit Number  1    Number of Visits  17    Date for PT Re-Evaluation  04/14/19    Authorization Type  1    Authorization Time Period  of 10 progress report    PT Start Time  1025   pt arrived late   PT Stop Time  1118    PT Time Calculation (min)  53 min    Activity Tolerance  Patient tolerated treatment well    Behavior During Therapy  [facility] for tasks assessed/performed       Past Medical History:  Diagnosis Date  . Aortic aneurysm, thoracic ([place])   . Aortic valve disease   . Arthritis   . BPH (benign prostatic hyperplasia)   . COPD (chronic obstructive pulmonary disease) ([place])   . GERD (gastroesophageal reflux disease)   . HTN (hypertension)   . Hypercholesterolemia   . Stroke ([place])   . TIA (transient ischemic attack)     Past Surgical History:  Procedure Laterality Date  . ACHILLES TENDON REPAIR    . AORTIC VALVE REPLACEMENT     #23 On-X valve conduit  . ASCENDING AORTIC ANEURYSM REPAIR    . BACK SURGERY    . FOOT SURGERY      There were no vitals filed for this visit.   Subjective Assessment - 02/16/19 1027    Subjective  No pain or discomfort currently.    Pertinent History  B LE weakness. Pt states that he does not usually use his rollator but does not know when he will feel out of breath.  Pt has a hx aortic valve replacement 2008. Also had another Sternotomy 12/01/2018. 12/09/2018 pt underwent TEVAR with a L subclavion artery and coratid artery graft.  Pt was able to ambulate with a walker 2 weeks after the surgeries. Pt also had a UTI after both surgeries. Pt was admitted. Pt  was not allowed to walk for 1 week. Pt was also told that pt was going to have neurological issues with his legs and feet but 2 months of PT should take care of that. Pt went home and pt had a crazy walk and felt like he was a "prancing horse. " Pt feels like he is raising his feet high without him being able to control. Had PT at [facility] after his surgery and did well but still had difficulty controlling how high he can lift his legs. Had the difficulty with controlling how high he can lift his legs after his UTI. His legs usually comes high in the middle of the night going to the bathroom (after being in bed for a few hours), as well after sitting for a while (a couple of hours) at times, kind of unpredictable. Pt sits on a chair lift. Pt also has a difficult time with swelling in his feet and ankles. Take lasix to help. Pt sits for a while keeping his feet elevated due to the B LE swelling. B LE swelling may have started after his UTI. Pt also has a hx of 3 CVA (first 2 were TIAs 2012 and 2014), The third occurence  was actually a full infarct/CVA in 2015 which decreased his endurance. Did not notice any weakness. Pt currently unable to play golf which is his leisure activity. Pt could not lift more than 5 lbs after his thoracic surgery in 11/2018. Has not seen Dr. Mali Hughes yet so does not know if his restrictions have been lifted yet. Pt furniture walks. [place] one time around December 15/16, 2020. Pt tried to break his fall and landed on his R side. Does not know if he moved anything in his chest surgery. Did not cal lhis doctor. Denies chest pain or difficulty breathing, lightheadedness or dizziness. Pt states that standing up from a regular chair when pt has to use his arms, pt feels like everything inside him feels like is drained, his legs quiver and has to sit back down. Pt sits down and does the pursed lip breathing, pt would recover. If pt however does not use his hands, he is fine. Pt also adds numb  like feeling B legs.    Patient Stated Goals  Be able to walk normally and play basketball with grandsons like he used to before his surgery in October 2020.    Currently in Pain?  No/denies    Aggravating Factors   standing up after being in bed or sitting for a while.         [facility] PT Assessment - 02/16/19 1026      Assessment   Medical Diagnosis  Weakness of both lower extremities    Referring Provider (PT)  [name], MD     Onset Date/Surgical Date  02/12/19   Date PT referral signed   Prior Therapy  Prior outpatient PT at [facility]      Precautions   Precaution Comments  sternal precautions unless otherwise noted by MD      Restrictions   Other Position/Activity Restrictions  no known weight bearing restrictions      Balance Screen   Has the patient fallen in the past 6 months  Yes    How many times?  1      Prior Function   Vocation Requirements  PLOF: able to ambulate independently, play with his grandchildren    Leisure  golf      Observation/Other Assessments   Focus on Therapeutic Outcomes (FOTO)   LE FOTO 56      Posture/Postural Control   Posture Comments  Thoracic kyphosis, B protracted shoulders, and neck, B hip adduction      Strength   Right Hip Flexion  4/5    Right Hip Extension  4-/5   seated manually resisted   Right Hip ABduction  4/5   seated manually resisted clamshell   Left Hip Flexion  4/5    Left Hip Extension  4-/5   seated manually resisted   Left Hip ABduction  4/5   seated manually resisted clamshell   Right Knee Flexion  4+/5    Right Knee Extension  5/5    Left Knee Flexion  4/5    Left Knee Extension  5/5    Right Ankle Dorsiflexion  1/5    Right Ankle Plantar Flexion  4-/5   seated manually resisted   Left Ankle Dorsiflexion  1/5    Left Ankle Plantar Flexion  3+/5   seated manually resisted     Palpation   Palpation comment  R thoracolumbar paraspinal muscle tension      Ambulation/Gait   Gait Comments  Pt  ambulates with rollator. Slight  foot slap R LE, bilateral pelvic drop, bilateral hip adduction.                 Objective measurements completed on examination: See above findings.        No pacemakers or defibrilators.   Blood pressure measurements R ARM ONLY mechanically taken, R arm sitting, normal cuff: 96/52, HR 96 99% SpO2, room air   Check blood pressure  Surgical incisions healed.    Able to ambulate without AD short distances safely.   Patient is a 69 year old male who came to physical therapy secondary to bilateral LE weakness. He also demonstrates altered gait pattern and posture, bilateral hip and ankle weakness, decreased femoral control, decreased balance, and difficulty performing functional tasks such as walking and participating with activities such as playing with his grandsons. Pt will benefit from skilled physical therapy services to address the aforementioned deficits.       PT Education - 02/16/19 1244    Education Details  HEP, POC    Person(s) Educated  Patient    Methods  Explanation;Demonstration;Tactile cues;Verbal cues    Comprehension  Returned demonstration;Verbalized understanding       PT Short Term Goals - 02/16/19 1244      PT SHORT TERM GOAL #1   Title  Pt will be independent with his HEP to improve strength, balance, and function.    Baseline  Pt has started his HEP (02/16/2019)    Time  3    Period  Weeks    Status  New    Target Date  03/10/19        PT Long Term Goals - 02/16/19 1245      PT LONG TERM GOAL #1   Title  Pt will improve B LE strength by at least 1/2 MMT grade to promote ability to ambulate and perform functional tasks with less difficulty.    Time  8    Period  Weeks    Status  New    Target Date  04/14/19      PT LONG TERM GOAL #2   Title  Patient will be able to ambulate without use of AD at least 500 ft to promote ability to return to PLOF.    Baseline  Currently ambulates longer distances  with rollator (02/16/2019)    Time  8    Period  Weeks    Status  New    Target Date  04/14/19      PT LONG TERM GOAL #3   Title  Pt will improve FOTO score by at least 10 points as a demonstration of improved function.    Baseline  LE FOTO 56 (02/16/2019)    Time  8    Period  Weeks    Status  New    Target Date  04/14/19             Plan - 02/16/19 1238    Clinical Impression Statement  Patient is a 69 year old male who came to physical therapy secondary to bilateral LE weakness. He also demonstrates altered gait pattern and posture, bilateral hip and ankle weakness, decreased femoral control, decreased balance, and difficulty performing functional tasks such as walking and participating with activities such as playing with his grandsons. Pt will benefit from skilled physical therapy services to address the aforementioned deficits.    Personal Factors and Comorbidities  Age;Comorbidity 3+    Comorbidities  S/P TEVER, B LE weakness, hx of aortic valve  replacement, COPD, CVA    Examination-Activity Limitations  Transfers;Bed Mobility;Lift;Squat;Stairs;Carry;Locomotion Level    Stability/Clinical Decision Making  Stable/Uncomplicated    Clinical Decision Making  Low    Rehab Potential  Fair    PT Frequency  2x / week    PT Duration  8 weeks    PT Treatment/Interventions  Therapeutic exercise;Balance training;Neuromuscular re-education;Patient/family education;Manual techniques;Dry needling;Aquatic Therapy;Advice worker;Therapeutic activities    PT Next Visit Plan  hip, knee, ankle, trunk strengthening, femoral control, gait training, manual techniques, modalities PRN    Consulted and Agree with Plan of Care  Patient       Patient will benefit from skilled therapeutic intervention in order to improve the following deficits and impairments:  Postural dysfunction, Improper body mechanics, Difficulty walking, Decreased strength, Decreased endurance, Decreased  balance, Abnormal gait  Visit Diagnosis: Muscle weakness (generalized) - Plan: PT plan of care cert/re-cert  Difficulty in walking, not elsewhere classified - Plan: PT plan of care cert/re-cert     Problem List Patient Active Problem List   Diagnosis Date Noted  . Bilateral finger numbness 01/20/2019  . Headache 01/20/2019  . Night sweats 01/20/2019  . UTI (urinary tract infection) 01/20/2019  . Swelling of male genital structure 01/20/2019  . Educated about COVID-19 virus infection 01/05/2019  . Cardiomyopathy ([place]) 01/05/2019  . COVID-19 11/10/2018  . Fall 05/07/2018  . Abdominal pain 01/04/2018  . Costochondritis 01/04/2018  . Thyroid nodule 01/04/2018  . Dissecting aneurysm of thoracic aorta, Stanford type B ([place]) 11/08/2017  . Anemia 11/08/2017  . Hyponatremia 11/08/2017  . Overweight 07/01/2017  . TIA (transient ischemic attack)   . Stroke ([place])   . GERD (gastroesophageal reflux disease)   . Arthritis   . Neck pain 12/01/2016  . Knee osteoarthritis 12/01/2016  . Physical deconditioning 06/24/2016  . Dyspnea on exertion 06/23/2016  . Chronic pain of right knee 12/18/2015  . Chronic obstructive pulmonary disease ([place]) 11/14/2014  . BPH (benign prostatic hyperplasia) 11/14/2014  . Preventative health care 11/14/2014  . Long term current use of anticoagulant therapy 10/29/2013  . Cerebral infarction ([place]) 06/02/2013  . H/O aortic valve replacement 12/26/2011  . History of aortic arch replacement 01/07/2011  . HTN (hypertension)   . Hyperlipidemia   . Aortic valve disease     Joneen Boers PT, DPT   02/16/2019, 3:56 PM  [facility] [address] [place], [address] Phone: [phone]   Fax:  [phone]  Name: Alexander Barton MRN: [id number] Date of Birth: September 19, 1950

## 2019-02-16 NOTE — Patient Instructions (Signed)
Pt was recommended to use a strap to assist ankle DF to end range with pt trying to contract Tibialis anterior muscle throughout AAROM to promote strength and increase AROM. Pt demonstrated and verbalized understanding.

## 2019-02-20 ENCOUNTER — Other Ambulatory Visit: Payer: Self-pay | Admitting: Cardiology

## 2019-02-22 ENCOUNTER — Other Ambulatory Visit: Payer: Self-pay

## 2019-02-22 ENCOUNTER — Ambulatory Visit: Payer: Medicare PPO

## 2019-02-22 DIAGNOSIS — M6281 Muscle weakness (generalized): Secondary | ICD-10-CM | POA: Diagnosis not present

## 2019-02-22 DIAGNOSIS — R262 Difficulty in walking, not elsewhere classified: Secondary | ICD-10-CM

## 2019-02-22 NOTE — Patient Instructions (Signed)
Access Code: [email or web address]  URL: [email or web address]  Date: 02/22/2019  Prepared by: Joneen Boers   Exercises Seated Heel Raise - 10 reps - 3 sets - 5 seconds hold - 3x daily - 7x weekly

## 2019-02-22 NOTE — Therapy (Signed)
[facility] [address], [place], [address] Phone: [phone]   Fax:  [phone]  Physical Therapy Treatment  Patient Details  Name: Alexander Barton MRN: [id number] Date of Birth: 03-20-1950 Referring Provider (PT): [name], MD    Encounter Date: 02/22/2019  PT End of Session - 02/22/19 1305    Visit Number  1    Number of Visits  17    Date for PT Re-Evaluation  04/14/19    Authorization Type  1    Authorization Time Period  of 10 progress report    Authorization - Visit Number  1    Authorization - Number of Visits  16   to 04/19/2019   PT Start Time  [id number]    PT Stop Time  1346    PT Time Calculation (min)  41 min    Activity Tolerance  Patient tolerated treatment well    Behavior During Therapy  [facility] for tasks assessed/performed       Past Medical History:  Diagnosis Date  . Aortic aneurysm, thoracic ([place])   . Aortic valve disease   . Arthritis   . BPH (benign prostatic hyperplasia)   . COPD (chronic obstructive pulmonary disease) ([place])   . GERD (gastroesophageal reflux disease)   . HTN (hypertension)   . Hypercholesterolemia   . Stroke ([place])   . TIA (transient ischemic attack)     Past Surgical History:  Procedure Laterality Date  . ACHILLES TENDON REPAIR    . AORTIC VALVE REPLACEMENT     #23 On-X valve conduit  . ASCENDING AORTIC ANEURYSM REPAIR    . BACK SURGERY    . FOOT SURGERY      There were no vitals filed for this visit.  Subjective Assessment - 02/22/19 1307    Subjective  No pain or discomfort. Pt was standing and shaving and felt like he was weak as water and could not keep his breath after shaving. Had to sit down and do pursed lip breathing. Pt was showering for about 15-18 min and thought like he was going to collapse after getting out of the shower.  Pt was able to walk around a basketball complex with 4 basketball courts in it. Walked about 3-9 minute at a  time but took hydration standing rest breakes for 45-50 minutes Last Thursday.  Did not feel like his energy was being drained during that.    Pertinent History  B LE weakness. Pt states that he does not usually use his rollator but does not know when he will feel out of breath.  Pt has a hx aortic valve replacement 2008. Also had another Sternotomy 12/01/2018. 12/09/2018 pt underwent TEVAR with a L subclavion artery and coratid artery graft.  Pt was able to ambulate with a walker 2 weeks after the surgeries. Pt also had a UTI after both surgeries. Pt was admitted. Pt was not allowed to walk for 1 week. Pt was also told that pt was going to have neurological issues with his legs and feet but 2 months of PT should take care of that. Pt went home and pt had a crazy walk and felt like he was a "prancing horse. " Pt feels like he is raising his feet high without him being able to control. Had PT at [facility] after his surgery and did well but still had difficulty controlling how high he can lift his legs. Had the difficulty with controlling how  high he can lift his legs after his UTI. His legs usually comes high in the middle of the night going to the bathroom (after being in bed for a few hours), as well after sitting for a while (a couple of hours) at times, kind of unpredictable. Pt sits on a chair lift. Pt also has a difficult time with swelling in his feet and ankles. Take lasix to help. Pt sits for a while keeping his feet elevated due to the B LE swelling. B LE swelling may have started after his UTI. Pt also has a hx of 3 CVA (first 2 were TIAs 2012 and 2014), The third occurence was actually a full infarct/CVA in 2015 which decreased his endurance. Did not notice any weakness. Pt currently unable to play golf which is his leisure activity. Pt could not lift more than 5 lbs after his thoracic surgery in 11/2018. Has not seen Dr. Mali Hughes yet so does not know if his restrictions have been lifted yet. Pt  furniture walks. [place] one time around December 15/16, 2020. Pt tried to break his fall and landed on his R side. Does not know if he moved anything in his chest surgery. Did not cal lhis doctor. Denies chest pain or difficulty breathing, lightheadedness or dizziness. Pt states that standing up from a regular chair when pt has to use his arms, pt feels like everything inside him feels like is drained, his legs quiver and has to sit back down. Pt sits down and does the pursed lip breathing, pt would recover. If pt however does not use his hands, he is fine. Pt also adds numb like feeling B legs.    Patient Stated Goals  Be able to walk normally and play basketball with grandsons like he used to before his surgery in October 2020.    Currently in Pain?  No/denies                               PT Education - 02/22/19 1325    Education Details  ther-ex    Person(s) Educated  Patient    Methods  Explanation;Demonstration;Tactile cues;Verbal cues    Comprehension  Returned demonstration;Verbalized understanding       Objective     Medbridge    No pacemakers or defibrilators.   Blood pressure measurements R ARM ONLY mechanically taken, R arm sitting, normal cuff: 96/52, HR 96 99% SpO2, room air   Check blood pressure  Surgical incisions healed.    Able to ambulate without AD short distances safely.   Based on pt subjective: feels like he is drained when pt uses hands to push up from chair to stand. Does not feel drained when he stands without using his hands.    Therapeutic exercise Blood pressure R arm sitting, mechanically taken, normal cuff: 115/57, HR 88   Seated hip adduction pillow squeeze 3x10 seconds. (pt counts 10 seconds out loud to ensure pt is breathing)  Then 10x5 seconds   Seated heel raises 10x5 seconds for 3 sets to promote musculovenous pump  Seated knee flexion yellow band   R 10x3  L 10x3  Seated hip extension isometrics   R  5x5 seconds   L 5x5 seconds     Therapeutic rest breaks provided to allow pt to recover for his next exercise secondary to fatigue.    Improved exercise technique, movement at target joints, use of target muscles after mod  verbal, visual, tactile cues.     Response to treatment  Pt tolerated session well without aggravation of symptoms.   Clinical impression Worked on improving B LE strength to promote musculovenous pump and hopefully decrease loss of energy after standing up from sitting. Pt tolerated session well without aggravation of symptoms. Therapeutic rest breaks needed secondary to fatigue. Pt will benefit from continued skilled physical therapy services to improve strength and function.     PT Short Term Goals - 02/16/19 1244      PT SHORT TERM GOAL #1   Title  Pt will be independent with his HEP to improve strength, balance, and function.    Baseline  Pt has started his HEP (02/16/2019)    Time  3    Period  Weeks    Status  New    Target Date  03/10/19        PT Long Term Goals - 02/16/19 1245      PT LONG TERM GOAL #1   Title  Pt will improve B LE strength by at least 1/2 MMT grade to promote ability to ambulate and perform functional tasks with less difficulty.    Time  8    Period  Weeks    Status  New    Target Date  04/14/19      PT LONG TERM GOAL #2   Title  Patient will be able to ambulate without use of AD at least 500 ft to promote ability to return to PLOF.    Baseline  Currently ambulates longer distances with rollator (02/16/2019)    Time  8    Period  Weeks    Status  New    Target Date  04/14/19      PT LONG TERM GOAL #3   Title  Pt will improve FOTO score by at least 10 points as a demonstration of improved function.    Baseline  LE FOTO 56 (02/16/2019)    Time  8    Period  Weeks    Status  New    Target Date  04/14/19            Plan - 02/22/19 1325    Clinical Impression Statement  Worked on improving B LE strength to  promote musculovenous pump and hopefully decrease loss of energy after standing up from sitting. Pt tolerated session well without aggravation of symptoms. Therapeutic rest breaks needed secondary to fatigue. Pt will benefit from continued skilled physical therapy services to improve strength and function.    Personal Factors and Comorbidities  Age;Comorbidity 3+    Comorbidities  S/P TEVER, B LE weakness, hx of aortic valve replacement, COPD, CVA    Examination-Activity Limitations  Transfers;Bed Mobility;Lift;Squat;Stairs;Carry;Locomotion Level    Stability/Clinical Decision Making  Stable/Uncomplicated    Rehab Potential  Fair    PT Frequency  2x / week    PT Duration  8 weeks    PT Treatment/Interventions  Therapeutic exercise;Balance training;Neuromuscular re-education;Patient/family education;Manual techniques;Dry needling;Aquatic Therapy;Advice worker;Therapeutic activities    PT Next Visit Plan  hip, knee, ankle, trunk strengthening, femoral control, gait training, manual techniques, modalities PRN    Consulted and Agree with Plan of Care  Patient       Patient will benefit from skilled therapeutic intervention in order to improve the following deficits and impairments:  Postural dysfunction, Improper body mechanics, Difficulty walking, Decreased strength, Decreased endurance, Decreased balance, Abnormal gait  Visit Diagnosis: Muscle weakness (generalized)  Difficulty in walking,  not elsewhere classified     Problem List Patient Active Problem List   Diagnosis Date Noted  . Bilateral finger numbness 01/20/2019  . Headache 01/20/2019  . Night sweats 01/20/2019  . UTI (urinary tract infection) 01/20/2019  . Swelling of male genital structure 01/20/2019  . Educated about COVID-19 virus infection 01/05/2019  . Cardiomyopathy ([place]) 01/05/2019  . COVID-19 11/10/2018  . Fall 05/07/2018  . Abdominal pain 01/04/2018  . Costochondritis 01/04/2018  . Thyroid  nodule 01/04/2018  . Dissecting aneurysm of thoracic aorta, Stanford type B ([place]) 11/08/2017  . Anemia 11/08/2017  . Hyponatremia 11/08/2017  . Overweight 07/01/2017  . TIA (transient ischemic attack)   . Stroke ([place])   . GERD (gastroesophageal reflux disease)   . Arthritis   . Neck pain 12/01/2016  . Knee osteoarthritis 12/01/2016  . Physical deconditioning 06/24/2016  . Dyspnea on exertion 06/23/2016  . Chronic pain of right knee 12/18/2015  . Chronic obstructive pulmonary disease ([place]) 11/14/2014  . BPH (benign prostatic hyperplasia) 11/14/2014  . Preventative health care 11/14/2014  . Long term current use of anticoagulant therapy 10/29/2013  . Cerebral infarction ([place]) 06/02/2013  . H/O aortic valve replacement 12/26/2011  . History of aortic arch replacement 01/07/2011  . HTN (hypertension)   . Hyperlipidemia   . Aortic valve disease     Alexander Barton PT, DPT   02/22/2019, 6:33 PM  [facility] [address] [place], [address] Phone: [phone]   Fax:  [phone]  Name: TRIUMPH BECHERER MRN: [id number] Date of Birth: [birth year]

## 2019-02-23 ENCOUNTER — Ambulatory Visit (INDEPENDENT_AMBULATORY_CARE_PROVIDER_SITE_OTHER): Payer: Medicare PPO | Admitting: Pharmacist Clinician (PhC)/ Clinical Pharmacy Specialist

## 2019-02-23 DIAGNOSIS — Z7901 Long term (current) use of anticoagulants: Secondary | ICD-10-CM

## 2019-02-23 DIAGNOSIS — Z952 Presence of prosthetic heart valve: Secondary | ICD-10-CM

## 2019-02-23 DIAGNOSIS — I639 Cerebral infarction, unspecified: Secondary | ICD-10-CM

## 2019-02-23 DIAGNOSIS — I359 Nonrheumatic aortic valve disorder, unspecified: Secondary | ICD-10-CM

## 2019-02-23 LAB — POCT INR: INR: 1.5 — AB (ref 2.0–3.0)

## 2019-02-24 ENCOUNTER — Ambulatory Visit: Payer: Medicare PPO

## 2019-02-24 ENCOUNTER — Other Ambulatory Visit: Payer: Self-pay

## 2019-02-24 DIAGNOSIS — M6281 Muscle weakness (generalized): Secondary | ICD-10-CM

## 2019-02-24 DIAGNOSIS — R262 Difficulty in walking, not elsewhere classified: Secondary | ICD-10-CM

## 2019-02-24 NOTE — Therapy (Signed)
[facility] [address], [place], [address] Phone: [phone]   Fax:  [phone]  Physical Therapy Treatment  Patient Details  Name: Alexander Barton MRN: [id number] Date of Birth: 07-01-1950 Referring Provider (PT): [name], MD    Encounter Date: 02/24/2019  PT End of Session - 02/24/19 1517    Visit Number  3    Number of Visits  17    Date for PT Re-Evaluation  04/14/19    Authorization Type  3    Authorization Time Period  of 10 progress report    Authorization - Visit Number  3    Authorization - Number of Visits  16   to 04/19/2019   PT Start Time  [id number]    PT Stop Time  1558    PT Time Calculation (min)  41 min    Activity Tolerance  Patient tolerated treatment well    Behavior During Therapy  [facility] for tasks assessed/performed       Past Medical History:  Diagnosis Date  . Aortic aneurysm, thoracic ([place])   . Aortic valve disease   . Arthritis   . BPH (benign prostatic hyperplasia)   . COPD (chronic obstructive pulmonary disease) ([place])   . GERD (gastroesophageal reflux disease)   . HTN (hypertension)   . Hypercholesterolemia   . Stroke ([place])   . TIA (transient ischemic attack)     Past Surgical History:  Procedure Laterality Date  . ACHILLES TENDON REPAIR    . AORTIC VALVE REPLACEMENT     #23 On-X valve conduit  . ASCENDING AORTIC ANEURYSM REPAIR    . BACK SURGERY    . FOOT SURGERY      There were no vitals filed for this visit.  Subjective Assessment - 02/24/19 1518    Subjective  Standing up from sitting just now affected his breathing. Left his rollator because he is walking short distances to the clinic from his car.  Did pretty good after last session.  Improved ability to control hip flexion during gait with exercises.    Pertinent History  B LE weakness. Pt states that he does not usually use his rollator but does not know when he will feel out of breath.  Pt has  a hx aortic valve replacement 2008. Also had another Sternotomy 12/01/2018. 12/09/2018 pt underwent TEVAR with a L subclavion artery and coratid artery graft.  Pt was able to ambulate with a walker 2 weeks after the surgeries. Pt also had a UTI after both surgeries. Pt was admitted. Pt was not allowed to walk for 1 week. Pt was also told that pt was going to have neurological issues with his legs and feet but 2 months of PT should take care of that. Pt went home and pt had a crazy walk and felt like he was a "prancing horse. " Pt feels like he is raising his feet high without him being able to control. Had PT at [facility] after his surgery and did well but still had difficulty controlling how high he can lift his legs. Had the difficulty with controlling how high he can lift his legs after his UTI. His legs usually comes high in the middle of the night going to the bathroom (after being in bed for a few hours), as well after sitting for a while (a couple of hours) at times, kind of unpredictable. Pt sits on a chair lift. Pt also has a difficult  time with swelling in his feet and ankles. Take lasix to help. Pt sits for a while keeping his feet elevated due to the B LE swelling. B LE swelling may have started after his UTI. Pt also has a hx of 3 CVA (first 2 were TIAs 2012 and 2014), The third occurence was actually a full infarct/CVA in 2015 which decreased his endurance. Did not notice any weakness. Pt currently unable to play golf which is his leisure activity. Pt could not lift more than 5 lbs after his thoracic surgery in 11/2018. Has not seen Dr. Mali Hughes yet so does not know if his restrictions have been lifted yet. Pt furniture walks. [place] one time around December 15/16, 2020. Pt tried to break his fall and landed on his R side. Does not know if he moved anything in his chest surgery. Did not cal lhis doctor. Denies chest pain or difficulty breathing, lightheadedness or dizziness. Pt states that standing  up from a regular chair when pt has to use his arms, pt feels like everything inside him feels like is drained, his legs quiver and has to sit back down. Pt sits down and does the pursed lip breathing, pt would recover. If pt however does not use his hands, he is fine. Pt also adds numb like feeling B legs.    Patient Stated Goals  Be able to walk normally and play basketball with grandsons like he used to before his surgery in October 2020.    Currently in Pain?  No/denies                               PT Education - 02/24/19 1523    Education Details  ther-ex    Person(s) Educated  Patient    Methods  Explanation;Demonstration;Tactile cues;Verbal cues    Comprehension  Returned demonstration;Verbalized understanding      Objective    Medbridge Access Code: [email or web address]         No pacemakers or defibrilators.   Blood pressure measurements R ARM ONLY mechanically taken, R arm sitting, normal cuff: 96/52, HR 96 99% SpO2, room air  Check blood pressure  Surgical incisions healed.  Able to ambulate without AD short distances safely.   Based on pt subjective: feels like he is drained when pt uses hands to push up from chair to stand. Does not feel drained when he stands without using his hands.    Therapeutic exercise Blood pressure R arm sitting, mechanically taken, normal cuff: 108/53, HR 94   SpO2 100% room air  Seated hip adduction pillow squeeze 10x5 seconds (pt counts 10 seconds out loud to ensure pt is breathing) for 3 sets  Seated LAQ   R 10x5 seconds  L 10x5 seconds   Seated knee flexion yellow band              R 10x3             L 10x3   Seated hip extension isometrics              R 5x5 seconds for 2 sets             L 5x5 seconds for 2 sets  Reviewed and given as part of his HEP. Pt demonstrated and verbalized understanding. Handout provided.    Standing heel raises with B UE light touch assist   5x2, then 10x    Standing hip flexion with  one UE assist   R 10x2  L 10x2  (March in place)   Pt states better hip flexion control with increased exercise   Standing glute max squeeze 10x    Therapeutic rest breaks provided to allow pt to recover for his next exercise secondary to fatigue.    Improved exercise technique, movement at target joints, use of target muscles after mod verbal, visual, tactile cues.     Response to treatment  Pt tolerated session well without aggravation of symptoms.   Clinical impression  Continued working on improving B LE strength to promote musculovenous pump for blood flow as well as to promote ability to ambulate and perform standing tasks with less difficulty. Pt able to ambulate into and out of clinic today without using his rollator (observed). Therapeutic rest breaks provided secondary to fatigue. Pt tolerated session well without aggravation of symptoms. Pt will benefit from continued skilled physical therapy services to improve strength, endurance, and function.       PT Short Term Goals - 02/16/19 1244      PT SHORT TERM GOAL #1   Title  Pt will be independent with his HEP to improve strength, balance, and function.    Baseline  Pt has started his HEP (02/16/2019)    Time  3    Period  Weeks    Status  New    Target Date  03/10/19        PT Long Term Goals - 02/16/19 1245      PT LONG TERM GOAL #1   Title  Pt will improve B LE strength by at least 1/2 MMT grade to promote ability to ambulate and perform functional tasks with less difficulty.    Time  8    Period  Weeks    Status  New    Target Date  04/14/19      PT LONG TERM GOAL #2   Title  Patient will be able to ambulate without use of AD at least 500 ft to promote ability to return to PLOF.    Baseline  Currently ambulates longer distances with rollator (02/16/2019)    Time  8    Period  Weeks    Status  New    Target Date  04/14/19      PT LONG TERM GOAL #3   Title  Pt  will improve FOTO score by at least 10 points as a demonstration of improved function.    Baseline  LE FOTO 56 (02/16/2019)    Time  8    Period  Weeks    Status  New    Target Date  04/14/19            Plan - 02/24/19 1539    Clinical Impression Statement  Continued working on improving B LE strength to promote musculovenous pump for blood flow as well as to promote ability to ambulate and perform standing tasks with less difficulty. Pt able to ambulate into and out of clinic today without using his rollator (observed). Therapeutic rest breaks provided secondary to fatigue. Pt tolerated session well without aggravation of symptoms. Pt will benefit from continued skilled physical therapy services to improve strength, endurance, and function.    Personal Factors and Comorbidities  Age;Comorbidity 3+    Comorbidities  S/P TEVER, B LE weakness, hx of aortic valve replacement, COPD, CVA    Examination-Activity Limitations  Transfers;Bed Mobility;Lift;Squat;Stairs;Carry;Locomotion Level    Stability/Clinical Decision Making  Stable/Uncomplicated    Rehab  Potential  Fair    PT Frequency  2x / week    PT Duration  8 weeks    PT Treatment/Interventions  Therapeutic exercise;Balance training;Neuromuscular re-education;Patient/family education;Manual techniques;Dry needling;Aquatic Therapy;Advice worker;Therapeutic activities    PT Next Visit Plan  hip, knee, ankle, trunk strengthening, femoral control, gait training, manual techniques, modalities PRN    Consulted and Agree with Plan of Care  Patient       Patient will benefit from skilled therapeutic intervention in order to improve the following deficits and impairments:  Postural dysfunction, Improper body mechanics, Difficulty walking, Decreased strength, Decreased endurance, Decreased balance, Abnormal gait  Visit Diagnosis: Muscle weakness (generalized)  Difficulty in walking, not elsewhere  classified     Problem List Patient Active Problem List   Diagnosis Date Noted  . Bilateral finger numbness 01/20/2019  . Headache 01/20/2019  . Night sweats 01/20/2019  . UTI (urinary tract infection) 01/20/2019  . Swelling of male genital structure 01/20/2019  . Educated about COVID-19 virus infection 01/05/2019  . Cardiomyopathy ([place]) 01/05/2019  . COVID-19 11/10/2018  . Fall 05/07/2018  . Abdominal pain 01/04/2018  . Costochondritis 01/04/2018  . Thyroid nodule 01/04/2018  . Dissecting aneurysm of thoracic aorta, Stanford type B ([place]) 11/08/2017  . Anemia 11/08/2017  . Hyponatremia 11/08/2017  . Overweight 07/01/2017  . TIA (transient ischemic attack)   . Stroke ([place])   . GERD (gastroesophageal reflux disease)   . Arthritis   . Neck pain 12/01/2016  . Knee osteoarthritis 12/01/2016  . Physical deconditioning 06/24/2016  . Dyspnea on exertion 06/23/2016  . Chronic pain of right knee 12/18/2015  . Chronic obstructive pulmonary disease ([place]) 11/14/2014  . BPH (benign prostatic hyperplasia) 11/14/2014  . Preventative health care 11/14/2014  . Long term current use of anticoagulant therapy 10/29/2013  . Cerebral infarction ([place]) 06/02/2013  . H/O aortic valve replacement 12/26/2011  . History of aortic arch replacement 01/07/2011  . HTN (hypertension)   . Hyperlipidemia   . Aortic valve disease    Joneen Boers PT, DPT   02/24/2019, 5:13 PM  [facility] [address] [place], [address] Phone: [phone]   Fax:  [phone]  Name: ARAFAT WINKLE MRN: [id number] Date of Birth: October 07, 1950

## 2019-02-24 NOTE — Patient Instructions (Signed)
  Make sure you breathe during your exercises    Seated hip extension isometrics   Sitting on a chair,    Squeeze your rear end muscles together and press your left foot onto the floor.      Hold for 5 seconds     Then press your right foot onto the floor    Hold for 5 seconds    Repeat 5 times each side, alternating    Perform 3 sessions daily.

## 2019-03-01 ENCOUNTER — Ambulatory Visit: Payer: Medicare PPO

## 2019-03-01 ENCOUNTER — Other Ambulatory Visit: Payer: Self-pay

## 2019-03-01 DIAGNOSIS — M6281 Muscle weakness (generalized): Secondary | ICD-10-CM | POA: Diagnosis not present

## 2019-03-01 DIAGNOSIS — R262 Difficulty in walking, not elsewhere classified: Secondary | ICD-10-CM | POA: Diagnosis not present

## 2019-03-01 NOTE — Therapy (Signed)
[facility] [address] [place], [address] Phone: [phone]   Fax:  [phone]  Physical Therapy Treatment  Patient Details  Name: Alexander Barton MRN: [id number] Date of Birth: 02-02-1951 Referring Provider (PT): [name], MD    Encounter Date: 03/01/2019  PT End of Session - 03/01/19 1302    Visit Number  4    Number of Visits  17    Date for PT Re-Evaluation  04/14/19    Authorization Type  4    Authorization Time Period  of 10 progress report    Authorization - Visit Number  4    Authorization - Number of Visits  16   to 04/19/2019   PT Start Time  1302    PT Stop Time  1342    PT Time Calculation (min)  40 min    Activity Tolerance  Patient tolerated treatment well    Behavior During Therapy  [facility] for tasks assessed/performed       Past Medical History:  Diagnosis Date  . Aortic aneurysm, thoracic ([place])   . Aortic valve disease   . Arthritis   . BPH (benign prostatic hyperplasia)   . COPD (chronic obstructive pulmonary disease) ([place])   . GERD (gastroesophageal reflux disease)   . HTN (hypertension)   . Hypercholesterolemia   . Stroke ([place])   . TIA (transient ischemic attack)     Past Surgical History:  Procedure Laterality Date  . ACHILLES TENDON REPAIR    . AORTIC VALVE REPLACEMENT     #23 On-X valve conduit  . ASCENDING AORTIC ANEURYSM REPAIR    . BACK SURGERY    . FOOT SURGERY      There were no vitals filed for this visit.  Subjective Assessment - 03/01/19 1303    Subjective  Putting some weight that he lost back. No pain. Does well with walking without AD on flat surface for short distances. Standing up from a seated position has really improved (not feeling drained as much when he uses his hands).    Pertinent History  B LE weakness. Pt states that he does not usually use his rollator but does not know when he will feel out of breath.  Pt has a hx aortic valve  replacement 2008. Also had another Sternotomy 12/01/2018. 12/09/2018 pt underwent TEVAR with a L subclavion artery and coratid artery graft.  Pt was able to ambulate with a walker 2 weeks after the surgeries. Pt also had a UTI after both surgeries. Pt was admitted. Pt was not allowed to walk for 1 week. Pt was also told that pt was going to have neurological issues with his legs and feet but 2 months of PT should take care of that. Pt went home and pt had a crazy walk and felt like he was a "prancing horse. " Pt feels like he is raising his feet high without him being able to control. Had PT at [facility] after his surgery and did well but still had difficulty controlling how high he can lift his legs. Had the difficulty with controlling how high he can lift his legs after his UTI. His legs usually comes high in the middle of the night going to the bathroom (after being in bed for a few hours), as well after sitting for a while (a couple of hours) at times, kind of unpredictable. Pt sits on a chair lift. Pt also has a difficult time with  swelling in his feet and ankles. Take lasix to help. Pt sits for a while keeping his feet elevated due to the B LE swelling. B LE swelling may have started after his UTI. Pt also has a hx of 3 CVA (first 2 were TIAs 2012 and 2014), The third occurence was actually a full infarct/CVA in 2015 which decreased his endurance. Did not notice any weakness. Pt currently unable to play golf which is his leisure activity. Pt could not lift more than 5 lbs after his thoracic surgery in 11/2018. Has not seen Dr. Mali Hughes yet so does not know if his restrictions have been lifted yet. Pt furniture walks. [place] one time around December 15/16, 2020. Pt tried to break his fall and landed on his R side. Does not know if he moved anything in his chest surgery. Did not cal lhis doctor. Denies chest pain or difficulty breathing, lightheadedness or dizziness. Pt states that standing up from a regular  chair when pt has to use his arms, pt feels like everything inside him feels like is drained, his legs quiver and has to sit back down. Pt sits down and does the pursed lip breathing, pt would recover. If pt however does not use his hands, he is fine. Pt also adds numb like feeling B legs.    Patient Stated Goals  Be able to walk normally and play basketball with grandsons like he used to before his surgery in October 2020.    Currently in Pain?  No/denies                               PT Education - 03/01/19 1315    Education Details  ther-ex, HEP    Person(s) Educated  Patient    Methods  Explanation;Demonstration;Tactile cues;Verbal cues;Handout    Comprehension  Verbalized understanding;Returned demonstration       Objective    MedbridgeAccess Code: [email or web address]   No pacemakers or defibrilators.   No latex allergies    Blood pressure measurements R ARM ONLY mechanically taken, R arm sitting, normal cuff: 96/52, HR 96 99% SpO2, room air  Check blood pressure  Surgical incisions healed.  Able to ambulate without AD short distances safely.   Based on pt subjective: feels like he is drained when pt uses hands to push up from chair to stand. Does not feel drained when he stands without using his hands.     Pt states that he called his cardiac surgeon's office and was told that his only limitation is to not strain.     Therapeutic exercise Blood pressure R arm sitting, mechanically taken, normal cuff: 108/53, HR 104  SpO2 98% room air  Seated knee flexion yellow band  R 10x3 L 10x3  Seated LAQ              R 10x5 seconds             L 10x5 seconds   Seated hip extension isometrics  R 5x5 seconds for 2 sets L 5x5 seconds for 2 sets  Sit <> stand 5x  Standing heel raises with B UE light touch assist          10x2   Standing hip abduction with B UE assist   R  10x  L 10x  PT assist to prevent compensation  Difficult for pt.   Seated transversus abdominis contraction 10x3  Standing glute max squeeze 10x3  Standing hip flexion with one UE assist              R 10x2             L 10x2             (March in place)              Pt states better hip flexion control with increased exercise     Therapeutic rest breaks provided to allow pt to recover for his next exercise secondary to fatigue.   Improved exercise technique, movement at target joints, use of target muscles after mod verbal, visual, tactile cues.    Response to treatment Pt tolerated session well without aggravation of symptoms.  Clinical impression Pt seems to be improving ability to stand up from a chair without symptoms based on clinical observation and pt reports. Pt also able to consistently ambulate in the clinic without need for AD. Continued with general LE strengthening to promote progress. Pt tolerated session well without aggravation of symptoms. Pt will benefit from continued skilled physical therapy services to improve LE strength, function, and decrease difficulty performing standing tasks and ambulating.     PT Short Term Goals - 02/16/19 1244      PT SHORT TERM GOAL #1   Title  Pt will be independent with his HEP to improve strength, balance, and function.    Baseline  Pt has started his HEP (02/16/2019)    Time  3    Period  Weeks    Status  New    Target Date  03/10/19        PT Long Term Goals - 02/16/19 1245      PT LONG TERM GOAL #1   Title  Pt will improve B LE strength by at least 1/2 MMT grade to promote ability to ambulate and perform functional tasks with less difficulty.    Time  8    Period  Weeks    Status  New    Target Date  04/14/19      PT LONG TERM GOAL #2   Title  Patient will be able to ambulate without use of AD at least 500 ft to promote ability to return to PLOF.    Baseline  Currently ambulates longer  distances with rollator (02/16/2019)    Time  8    Period  Weeks    Status  New    Target Date  04/14/19      PT LONG TERM GOAL #3   Title  Pt will improve FOTO score by at least 10 points as a demonstration of improved function.    Baseline  LE FOTO 56 (02/16/2019)    Time  8    Period  Weeks    Status  New    Target Date  04/14/19            Plan - 03/01/19 1316    Clinical Impression Statement  Pt seems to be improving ability to stand up from a chair without symptoms based on clinical observation and pt reports. Pt also able to consistently ambulate in the clinic without need for AD. Continued with general LE strengthening to promote progress. Pt tolerated session well without aggravation of symptoms. Pt will benefit from continued skilled physical therapy services to improve LE strength, function, and decrease difficulty performing standing tasks and ambulating.    Personal Factors and Comorbidities  Age;Comorbidity 3+    Comorbidities  S/P TEVER,  B LE weakness, hx of aortic valve replacement, COPD, CVA    Examination-Activity Limitations  Transfers;Bed Mobility;Lift;Squat;Stairs;Carry;Locomotion Level    Stability/Clinical Decision Making  Stable/Uncomplicated    Rehab Potential  Fair    PT Frequency  2x / week    PT Duration  8 weeks    PT Treatment/Interventions  Therapeutic exercise;Balance training;Neuromuscular re-education;Patient/family education;Manual techniques;Dry needling;Aquatic Therapy;Advice worker;Therapeutic activities    PT Next Visit Plan  hip, knee, ankle, trunk strengthening, femoral control, gait training, manual techniques, modalities PRN    Consulted and Agree with Plan of Care  Patient       Patient will benefit from skilled therapeutic intervention in order to improve the following deficits and impairments:  Postural dysfunction, Improper body mechanics, Difficulty walking, Decreased strength, Decreased endurance, Decreased  balance, Abnormal gait  Visit Diagnosis: Muscle weakness (generalized)  Difficulty in walking, not elsewhere classified     Problem List Patient Active Problem List   Diagnosis Date Noted  . Bilateral finger numbness 01/20/2019  . Headache 01/20/2019  . Night sweats 01/20/2019  . UTI (urinary tract infection) 01/20/2019  . Swelling of male genital structure 01/20/2019  . Educated about COVID-19 virus infection 01/05/2019  . Cardiomyopathy ([place]) 01/05/2019  . COVID-19 11/10/2018  . Fall 05/07/2018  . Abdominal pain 01/04/2018  . Costochondritis 01/04/2018  . Thyroid nodule 01/04/2018  . Dissecting aneurysm of thoracic aorta, Stanford type B ([place]) 11/08/2017  . Anemia 11/08/2017  . Hyponatremia 11/08/2017  . Overweight 07/01/2017  . TIA (transient ischemic attack)   . Stroke ([place])   . GERD (gastroesophageal reflux disease)   . Arthritis   . Neck pain 12/01/2016  . Knee osteoarthritis 12/01/2016  . Physical deconditioning 06/24/2016  . Dyspnea on exertion 06/23/2016  . Chronic pain of right knee 12/18/2015  . Chronic obstructive pulmonary disease ([place]) 11/14/2014  . BPH (benign prostatic hyperplasia) 11/14/2014  . Preventative health care 11/14/2014  . Long term current use of anticoagulant therapy 10/29/2013  . Cerebral infarction ([place]) 06/02/2013  . H/O aortic valve replacement 12/26/2011  . History of aortic arch replacement 01/07/2011  . HTN (hypertension)   . Hyperlipidemia   . Aortic valve disease     Joneen Boers PT, DPT   03/01/2019, 4:18 PM  [facility] [address] [place], [address] Phone: [phone]   Fax:  [phone]  Name: Alexander Barton MRN: [id number] Date of Birth: 1950/09/18

## 2019-03-01 NOTE — Patient Instructions (Addendum)
  Access Code: [email or web address]  URL: [email or web address]  Date: 03/01/2019  Prepared by: Joneen Boers   Exercises Seated Heel Raise - 10 reps - 3 sets - 5 seconds hold - 3x daily - 7x weekly Seated Hamstring Curl with Anchored Resistance - 10 reps - 3 sets - 1x daily - 7x weekly     Also added glute max squeezes as part of his HEP. Pt demonstrated and verbalized understanding.

## 2019-03-03 ENCOUNTER — Other Ambulatory Visit: Payer: Self-pay

## 2019-03-03 ENCOUNTER — Ambulatory Visit: Payer: Medicare PPO

## 2019-03-03 DIAGNOSIS — M6281 Muscle weakness (generalized): Secondary | ICD-10-CM | POA: Diagnosis not present

## 2019-03-03 DIAGNOSIS — R262 Difficulty in walking, not elsewhere classified: Secondary | ICD-10-CM

## 2019-03-03 NOTE — Therapy (Signed)
[facility] [address], [place], [address] Phone: [phone]   Fax:  [phone]  Physical Therapy Treatment  Patient Details  Name: ZACKAREY KIENLE MRN: [id number] Date of Birth: December 25, 1950 Referring Provider (PT): [name], MD    Encounter Date: 03/03/2019  PT End of Session - 03/03/19 1038    Visit Number  5    Number of Visits  17    Date for PT Re-Evaluation  04/14/19    Authorization Type  5    Authorization Time Period  of 10 progress report    Authorization - Visit Number  5    Authorization - Number of Visits  16   to 04/19/2019   PT Start Time  1038    PT Stop Time  1120    PT Time Calculation (min)  42 min    Activity Tolerance  Patient tolerated treatment well    Behavior During Therapy  [facility] for tasks assessed/performed       Past Medical History:  Diagnosis Date  . Aortic aneurysm, thoracic ([place])   . Aortic valve disease   . Arthritis   . BPH (benign prostatic hyperplasia)   . COPD (chronic obstructive pulmonary disease) ([place])   . GERD (gastroesophageal reflux disease)   . HTN (hypertension)   . Hypercholesterolemia   . Stroke ([place])   . TIA (transient ischemic attack)     Past Surgical History:  Procedure Laterality Date  . ACHILLES TENDON REPAIR    . AORTIC VALVE REPLACEMENT     #23 On-X valve conduit  . ASCENDING AORTIC ANEURYSM REPAIR    . BACK SURGERY    . FOOT SURGERY      There were no vitals filed for this visit.  Subjective Assessment - 03/03/19 1044    Subjective  Pt states feeling good. Getting up out of the chair is better. Continued walking, his legs feel weak. Surgeon is considering referring pt to a neurologist.    Pertinent History  B LE weakness. Pt states that he does not usually use his rollator but does not know when he will feel out of breath.  Pt has a hx aortic valve replacement 2008. Also had another Sternotomy 12/01/2018. 12/09/2018 pt  underwent TEVAR with a L subclavion artery and coratid artery graft.  Pt was able to ambulate with a walker 2 weeks after the surgeries. Pt also had a UTI after both surgeries. Pt was admitted. Pt was not allowed to walk for 1 week. Pt was also told that pt was going to have neurological issues with his legs and feet but 2 months of PT should take care of that. Pt went home and pt had a crazy walk and felt like he was a "prancing horse. " Pt feels like he is raising his feet high without him being able to control. Had PT at [facility] after his surgery and did well but still had difficulty controlling how high he can lift his legs. Had the difficulty with controlling how high he can lift his legs after his UTI. His legs usually comes high in the middle of the night going to the bathroom (after being in bed for a few hours), as well after sitting for a while (a couple of hours) at times, kind of unpredictable. Pt sits on a chair lift. Pt also has a difficult time with swelling in his feet and ankles. Take lasix to help. Pt sits for a  while keeping his feet elevated due to the B LE swelling. B LE swelling may have started after his UTI. Pt also has a hx of 3 CVA (first 2 were TIAs 2012 and 2014), The third occurence was actually a full infarct/CVA in 2015 which decreased his endurance. Did not notice any weakness. Pt currently unable to play golf which is his leisure activity. Pt could not lift more than 5 lbs after his thoracic surgery in 11/2018. Has not seen Dr. Mali Hughes yet so does not know if his restrictions have been lifted yet. Pt furniture walks. [place] one time around December 15/16, 2020. Pt tried to break his fall and landed on his R side. Does not know if he moved anything in his chest surgery. Did not cal lhis doctor. Denies chest pain or difficulty breathing, lightheadedness or dizziness. Pt states that standing up from a regular chair when pt has to use his arms, pt feels like everything inside him  feels like is drained, his legs quiver and has to sit back down. Pt sits down and does the pursed lip breathing, pt would recover. If pt however does not use his hands, he is fine. Pt also adds numb like feeling B legs.    Patient Stated Goals  Be able to walk normally and play basketball with grandsons like he used to before his surgery in October 2020.    Currently in Pain?  No/denies                               PT Education - 03/03/19 1041    Education Details  ther-ex    Person(s) Educated  Patient    Methods  Explanation;Demonstration;Tactile cues;Verbal cues    Comprehension  Returned demonstration;Verbalized understanding      Objective    MedbridgeAccess Code: [email or web address]   No pacemakers or defibrilators.   No latex allergies    Blood pressure measurements R ARM ONLY mechanically taken, R arm sitting, normal cuff: 96/52, HR 96 99% SpO2, room air  Check blood pressure  Surgical incisions healed.  Able to ambulate without AD short distances safely.   Based on pt subjective: feels like he is drained when pt uses hands to push up from chair to stand. Does not feel drained when he stands without using his hands.     Pt states that he called his cardiac surgeon's office and was told that his only limitation is to not strain.     Manual therapy  Seated STM R lateral hamstrings to decrease tension  Seated STM lateral knee to decrease fascial restrictions Seated STM R Lumbar paraspinal muscles   No change in R knee pain with mini squats     Therapeutic exercise Blood pressure R arm sitting, mechanically taken, normal cuff: 126/58, HR91  SpO2 100% room air  Seated knee flexion yellow band  R 10x3 L 10x3  Standing heel raises with B UE light touch assist   20x   Standing mini squats 10x  R lateral knee discomfort  Then 10x after manual therapy. No change in R knee  discomfort    Therapeutic rest breaks provided to allow pt to recover for his next exercise secondary to fatigue.   Improved exercise technique, movement at target joints, use of target muscles after mod verbal, visual, tactile cues.    Response to treatment Pt tolerated session well without aggravation of symptoms.  Clinical impression Continued working on  improving B LE strength to promote musculovenous pump to decrease symptoms with standing up from sitting as well as to promote ability to perform standing tasks as well as ambulate with less difficulty. Pt will benefit from continued skilled physical therapy services to improve balance, strength, and function.    PT Short Term Goals - 02/16/19 1244      PT SHORT TERM GOAL #1   Title  Pt will be independent with his HEP to improve strength, balance, and function.    Baseline  Pt has started his HEP (02/16/2019)    Time  3    Period  Weeks    Status  New    Target Date  03/10/19        PT Long Term Goals - 02/16/19 1245      PT LONG TERM GOAL #1   Title  Pt will improve B LE strength by at least 1/2 MMT grade to promote ability to ambulate and perform functional tasks with less difficulty.    Time  8    Period  Weeks    Status  New    Target Date  04/14/19      PT LONG TERM GOAL #2   Title  Patient will be able to ambulate without use of AD at least 500 ft to promote ability to return to PLOF.    Baseline  Currently ambulates longer distances with rollator (02/16/2019)    Time  8    Period  Weeks    Status  New    Target Date  04/14/19      PT LONG TERM GOAL #3   Title  Pt will improve FOTO score by at least 10 points as a demonstration of improved function.    Baseline  LE FOTO 56 (02/16/2019)    Time  8    Period  Weeks    Status  New    Target Date  04/14/19            Plan - 03/03/19 1040    Clinical Impression Statement  Continued working on improving B LE strength to promote  musculovenous pump to decrease symptoms with standing up from sitting as well as to promote ability to perform standing tasks as well as ambulate with less difficulty. Pt will benefit from continued skilled physical therapy services to improve balance, strength, and function.    Personal Factors and Comorbidities  Age;Comorbidity 3+    Comorbidities  S/P TEVER, B LE weakness, hx of aortic valve replacement, COPD, CVA    Examination-Activity Limitations  Transfers;Bed Mobility;Lift;Squat;Stairs;Carry;Locomotion Level    Stability/Clinical Decision Making  Stable/Uncomplicated    Rehab Potential  Fair    PT Frequency  2x / week    PT Duration  8 weeks    PT Treatment/Interventions  Therapeutic exercise;Balance training;Neuromuscular re-education;Patient/family education;Manual techniques;Dry needling;Aquatic Therapy;Advice worker;Therapeutic activities    PT Next Visit Plan  hip, knee, ankle, trunk strengthening, femoral control, gait training, manual techniques, modalities PRN    Consulted and Agree with Plan of Care  Patient       Patient will benefit from skilled therapeutic intervention in order to improve the following deficits and impairments:  Postural dysfunction, Improper body mechanics, Difficulty walking, Decreased strength, Decreased endurance, Decreased balance, Abnormal gait  Visit Diagnosis: Muscle weakness (generalized)  Difficulty in walking, not elsewhere classified     Problem List Patient Active Problem List   Diagnosis Date Noted  . Bilateral finger numbness 01/20/2019  .  Headache 01/20/2019  . Night sweats 01/20/2019  . UTI (urinary tract infection) 01/20/2019  . Swelling of male genital structure 01/20/2019  . Educated about COVID-19 virus infection 01/05/2019  . Cardiomyopathy ([place]) 01/05/2019  . COVID-19 11/10/2018  . Fall 05/07/2018  . Abdominal pain 01/04/2018  . Costochondritis 01/04/2018  . Thyroid nodule 01/04/2018  .  Dissecting aneurysm of thoracic aorta, Stanford type B ([place]) 11/08/2017  . Anemia 11/08/2017  . Hyponatremia 11/08/2017  . Overweight 07/01/2017  . TIA (transient ischemic attack)   . Stroke ([place])   . GERD (gastroesophageal reflux disease)   . Arthritis   . Neck pain 12/01/2016  . Knee osteoarthritis 12/01/2016  . Physical deconditioning 06/24/2016  . Dyspnea on exertion 06/23/2016  . Chronic pain of right knee 12/18/2015  . Chronic obstructive pulmonary disease ([place]) 11/14/2014  . BPH (benign prostatic hyperplasia) 11/14/2014  . Preventative health care 11/14/2014  . Long term current use of anticoagulant therapy 10/29/2013  . Cerebral infarction ([place]) 06/02/2013  . H/O aortic valve replacement 12/26/2011  . History of aortic arch replacement 01/07/2011  . HTN (hypertension)   . Hyperlipidemia   . Aortic valve disease     Joneen Boers PT, DPT   03/03/2019, 12:30 PM  [facility] [address], [place], [address] Phone: [phone]   Fax:  [phone]  Name: BANYAN TRACHT MRN: [id number] Date of Birth: 02/08/1950

## 2019-03-07 ENCOUNTER — Ambulatory Visit: Payer: Medicare PPO

## 2019-03-07 ENCOUNTER — Ambulatory Visit (INDEPENDENT_AMBULATORY_CARE_PROVIDER_SITE_OTHER): Payer: Medicare PPO | Admitting: Cardiology

## 2019-03-07 DIAGNOSIS — Z7901 Long term (current) use of anticoagulants: Secondary | ICD-10-CM

## 2019-03-07 DIAGNOSIS — I359 Nonrheumatic aortic valve disorder, unspecified: Secondary | ICD-10-CM

## 2019-03-07 LAB — POCT INR: INR: 1.7 — AB (ref 2.0–3.0)

## 2019-03-09 ENCOUNTER — Ambulatory Visit: Payer: Medicare PPO | Attending: Family Medicine

## 2019-03-09 ENCOUNTER — Other Ambulatory Visit: Payer: Self-pay

## 2019-03-09 DIAGNOSIS — M6281 Muscle weakness (generalized): Secondary | ICD-10-CM | POA: Insufficient documentation

## 2019-03-09 DIAGNOSIS — R262 Difficulty in walking, not elsewhere classified: Secondary | ICD-10-CM | POA: Insufficient documentation

## 2019-03-09 NOTE — Therapy (Signed)
[facility] [address] [place], [address] Phone: [phone]   Fax:  [phone]  Physical Therapy Treatment  Patient Details  Name: Alexander Barton MRN: [id number] Date of Birth: 20-Nov-1950 Referring Provider (PT): [name], MD    Encounter Date: 03/09/2019  PT End of Session - 03/09/19 0803    Visit Number  6    Number of Visits  17    Date for PT Re-Evaluation  04/14/19    Authorization Type  6    Authorization Time Period  of 10 progress report    Authorization - Visit Number  6    Authorization - Number of Visits  16   to 04/19/2019   PT Start Time  0803    PT Stop Time  0852    PT Time Calculation (min)  49 min    Activity Tolerance  Patient tolerated treatment well    Behavior During Therapy  [facility] for tasks assessed/performed       Past Medical History:  Diagnosis Date  . Aortic aneurysm, thoracic ([place])   . Aortic valve disease   . Arthritis   . BPH (benign prostatic hyperplasia)   . COPD (chronic obstructive pulmonary disease) ([place])   . GERD (gastroesophageal reflux disease)   . HTN (hypertension)   . Hypercholesterolemia   . Stroke ([place])   . TIA (transient ischemic attack)     Past Surgical History:  Procedure Laterality Date  . ACHILLES TENDON REPAIR    . AORTIC VALVE REPLACEMENT     #23 On-X valve conduit  . ASCENDING AORTIC ANEURYSM REPAIR    . BACK SURGERY    . FOOT SURGERY      There were no vitals filed for this visit.  Subjective Assessment - 03/09/19 0804    Subjective  Pt is currently on a clinical trial for [employer], taking a saline solution instead of statins due to statin allergy.  No pain. Walking is better. Pulled something at his abdominal area performing alternating shoulder flexion exercises which radiated to his back. Walking is fractionally better. Pt states that if he can get the movement in his ankles, he will be good.    Pertinent History  B LE  weakness. Pt states that he does not usually use his rollator but does not know when he will feel out of breath.  Pt has a hx aortic valve replacement 2008. Also had another Sternotomy 12/01/2018. 12/09/2018 pt underwent TEVAR with a L subclavion artery and coratid artery graft.  Pt was able to ambulate with a walker 2 weeks after the surgeries. Pt also had a UTI after both surgeries. Pt was admitted. Pt was not allowed to walk for 1 week. Pt was also told that pt was going to have neurological issues with his legs and feet but 2 months of PT should take care of that. Pt went home and pt had a crazy walk and felt like he was a "prancing horse. " Pt feels like he is raising his feet high without him being able to control. Had PT at [facility] after his surgery and did well but still had difficulty controlling how high he can lift his legs. Had the difficulty with controlling how high he can lift his legs after his UTI. His legs usually comes high in the middle of the night going to the bathroom (after being in bed for a few hours), as well after sitting for a while (  a couple of hours) at times, kind of unpredictable. Pt sits on a chair lift. Pt also has a difficult time with swelling in his feet and ankles. Take lasix to help. Pt sits for a while keeping his feet elevated due to the B LE swelling. B LE swelling may have started after his UTI. Pt also has a hx of 3 CVA (first 2 were TIAs 2012 and 2014), The third occurence was actually a full infarct/CVA in 2015 which decreased his endurance. Did not notice any weakness. Pt currently unable to play golf which is his leisure activity. Pt could not lift more than 5 lbs after his thoracic surgery in 11/2018. Has not seen Dr. Mali Hughes yet so does not know if his restrictions have been lifted yet. Pt furniture walks. [place] one time around December 15/16, 2020. Pt tried to break his fall and landed on his R side. Does not know if he moved anything in his chest surgery.  Did not cal lhis doctor. Denies chest pain or difficulty breathing, lightheadedness or dizziness. Pt states that standing up from a regular chair when pt has to use his arms, pt feels like everything inside him feels like is drained, his legs quiver and has to sit back down. Pt sits down and does the pursed lip breathing, pt would recover. If pt however does not use his hands, he is fine. Pt also adds numb like feeling B legs.    Patient Stated Goals  Be able to walk normally and play basketball with grandsons like he used to before his surgery in October 2020.    Currently in Pain?  No/denies                               PT Education - 03/09/19 0810    Education Details  ther-ex    Person(s) Educated  Patient    Methods  Explanation;Demonstration;Tactile cues;Verbal cues    Comprehension  Returned demonstration;Verbalized understanding       Objective    MedbridgeAccess Code: [email or web address]   No pacemakers or defibrilators.   No latex allergies    Blood pressure measurements R ARM ONLY  Check blood pressure  Surgical incisions healed.  Able to ambulate without AD short distances safely.   Based on pt subjective: feels like he is drained when pt uses hands to push up from chair to stand. Does not feel drained when he stands without using his hands.     Pt states that he called his cardiac surgeon's office and was told that his only limitation is to not strain.    Manual therapy   Seated STM R thoraco lumbar paraspinal muscle to decrease tension   Felt good per pt   Therapeutic exercise Blood pressure R arm sitting, mechanically taken, normal cuff: 126/58, HR83  SpO2100% room air  Standing B gastroc stretch at 1st stair step 30 seconds x 3   R foot drop during gait  Seated R ankle DF AAROM with PT 10x5 seconds with end range holds  Then with strap assist 10x2 with 5 second holds at end range   Seated  transversus abdominis 5x. Discomfort, eases with rest. Exercises stopped.   Standing mini squats 7x, then 5x  Cues for femoral control      Therapeutic rest breaks provided to allow pt to recover for his next exercise secondary to fatigue.   Improved exercise technique, movement at target joints, use  of target muscles after mod verbal, visual, tactile cues.      Response to treatment Good tolerance to therapy today.   Clinical impression Continued working on improving LE strength as well as improving gastroc muscle flexibility, and tibialis muscle strengthening throughout range of motion to decrease foot drop and improve ability to ambulate and and perform standing tasks with less difficulty. Pt will benefit from continued skilled physical therapy services to improve strength endurance, function, and decrease difficulty with gait.     PT Short Term Goals - 02/16/19 1244      PT SHORT TERM GOAL #1   Title  Pt will be independent with his HEP to improve strength, balance, and function.    Baseline  Pt has started his HEP (02/16/2019)    Time  3    Period  Weeks    Status  New    Target Date  03/10/19        PT Long Term Goals - 02/16/19 1245      PT LONG TERM GOAL #1   Title  Pt will improve B LE strength by at least 1/2 MMT grade to promote ability to ambulate and perform functional tasks with less difficulty.    Time  8    Period  Weeks    Status  New    Target Date  04/14/19      PT LONG TERM GOAL #2   Title  Patient will be able to ambulate without use of AD at least 500 ft to promote ability to return to PLOF.    Baseline  Currently ambulates longer distances with rollator (02/16/2019)    Time  8    Period  Weeks    Status  New    Target Date  04/14/19      PT LONG TERM GOAL #3   Title  Pt will improve FOTO score by at least 10 points as a demonstration of improved function.    Baseline  LE FOTO 56 (02/16/2019)    Time  8    Period  Weeks     Status  New    Target Date  04/14/19            Plan - 03/09/19 0913    Clinical Impression Statement  Continued working on improving LE strength as well as improving gastroc muscle flexibility, and tibialis muscle strengthening throughout range of motion to decrease foot drop and improve ability to ambulate and and perform standing tasks with less difficulty. Pt will benefit from continued skilled physical therapy services to improve strength endurance, function, and decrease difficulty with gait.    Personal Factors and Comorbidities  Age;Comorbidity 3+    Comorbidities  S/P TEVER, B LE weakness, hx of aortic valve replacement, COPD, CVA    Examination-Activity Limitations  Transfers;Bed Mobility;Lift;Squat;Stairs;Carry;Locomotion Level    Stability/Clinical Decision Making  Stable/Uncomplicated    Rehab Potential  Fair    PT Frequency  2x / week    PT Duration  8 weeks    PT Treatment/Interventions  Therapeutic exercise;Balance training;Neuromuscular re-education;Patient/family education;Manual techniques;Dry needling;Aquatic Therapy;Advice worker;Therapeutic activities    PT Next Visit Plan  hip, knee, ankle, trunk strengthening, femoral control, gait training, manual techniques, modalities PRN    Consulted and Agree with Plan of Care  Patient       Patient will benefit from skilled therapeutic intervention in order to improve the following deficits and impairments:  Postural dysfunction, Improper body mechanics, Difficulty walking, Decreased strength,  Decreased endurance, Decreased balance, Abnormal gait  Visit Diagnosis: Muscle weakness (generalized)  Difficulty in walking, not elsewhere classified     Problem List Patient Active Problem List   Diagnosis Date Noted  . Bilateral finger numbness 01/20/2019  . Headache 01/20/2019  . Night sweats 01/20/2019  . UTI (urinary tract infection) 01/20/2019  . Swelling of male genital structure 01/20/2019   . Educated about COVID-19 virus infection 01/05/2019  . Cardiomyopathy ([place]) 01/05/2019  . COVID-19 11/10/2018  . Fall 05/07/2018  . Abdominal pain 01/04/2018  . Costochondritis 01/04/2018  . Thyroid nodule 01/04/2018  . Dissecting aneurysm of thoracic aorta, Stanford type B ([place]) 11/08/2017  . Anemia 11/08/2017  . Hyponatremia 11/08/2017  . Overweight 07/01/2017  . TIA (transient ischemic attack)   . Stroke ([place])   . GERD (gastroesophageal reflux disease)   . Arthritis   . Neck pain 12/01/2016  . Knee osteoarthritis 12/01/2016  . Physical deconditioning 06/24/2016  . Dyspnea on exertion 06/23/2016  . Chronic pain of right knee 12/18/2015  . Chronic obstructive pulmonary disease ([place]) 11/14/2014  . BPH (benign prostatic hyperplasia) 11/14/2014  . Preventative health care 11/14/2014  . Long term current use of anticoagulant therapy 10/29/2013  . Cerebral infarction ([place]) 06/02/2013  . H/O aortic valve replacement 12/26/2011  . History of aortic arch replacement 01/07/2011  . HTN (hypertension)   . Hyperlipidemia   . Aortic valve disease     Joneen Boers PT, DPT   03/09/2019, 9:21 AM  [facility] [address] [place], [address] Phone: [phone]   Fax:  [phone]  Name: Alexander Barton MRN: [id number] Date of Birth: Jun 29, 1950

## 2019-03-09 NOTE — Patient Instructions (Signed)
Access Code: [email or web address]  URL: [email or web address]  Date: 03/09/2019  Prepared by: Joneen Boers   Exercises Seated Heel Raise - 10 reps - 3 sets - 5 seconds hold - 3x daily - 7x weekly Seated Hamstring Curl with Anchored Resistance - 10 reps - 3 sets - 1x daily - 7x weekly Long Sitting Calf Stretch with Strap - 10 reps - 2 sets - 5 seconds hold - 1x daily - 7x weekly

## 2019-03-12 NOTE — Progress Notes (Signed)
Deidrick Fortes Croom Date of Birth: [birth year] Medical Record [id number]  History of Present Illness: Mr. Hambel is seen today for followup of valvular heart disease. He is status post mechanical aortic valve replacement in October 2008 with a #23 mm ON-X mechanical valve conduit with a button Bentall procedure and Hemi arch graft  and is on chronic Coumadin. He had a TIA in January 2012 and was admitted in May 2014 with a right thalamic CVA. He is now on aspirin and Coumadin.  His evaluation in the hospital included an echocardiogram which showed normal valve function. Carotid Dopplers were OK.  He was admitted in May 2018 at [facility] with worsening dyspnea. Seen by our service there. CTA of the chest demonstrated no evidence of PE or significant parenchymal lung disease. There is evidence of prior mechanical AVR and hemi-arch replacement. Coronary artery calcification is also noted as was cholelithiasis.  Myoview study was done and was normal. Echo also done as noted below.   He was admitted in September 2019 at [facility] with acute epigastric, chest, and back pain. Found to have a type B aortic dissection. Managed medically and followed by Dr. Mart Piggs at [facility].   He had  cardiac evaluation including cardiac cath that showed a 60% stenosis in an OM branch otherwise no obstructive disease. Echo showed normally functioning AV prosthesis and normal LV function. PFTs done with COPD.   On 12/02/18 he underwent redo sternotomy with Dr Ysidro Evert. He had 1. Ascending Aortic and total arch replacement with head vessel re-implantation.2. TEVAR w/ intentional L subclavian artery coverage, L carotid-subclavian bypass, and proximal L subclavian artery vascular plug. The postoperative course was remarkable for postop afib/flutter managed with amiodarone and beta blocker- NSR at time of discharge. Also developed postop delirium after TEVAR requiring psych consult. The patient was discharged to home on  12/13/2018. He was readmitted 11/16-11/20 for UTI, meeting sepsis criteria. He was treated with IV abx and transitioned to PO Cefpodoxime. Amiodarone was d/c'd prior to discharge from readmission.  He was seen by Dr Percival Spanish on 01/06/19 for evaluation of weakness. INR was noted to be high and Coumadin was held for 2 days. He was anemic but really unchanged from his post hospital discharge. It was noted that he had Covid 19 infection in early October and never felt fully recovered from that. He did have a follow up CT chest on November 24 that was stable.   On follow up today he states he still has significant limitations. His balance is very poor. He is very weak and gives out with minimal activity. He is lightheaded with standing. He gets short of breath easily.  Appetite is a  Better and he has gained weight.  No chest pain.  He has completed PT but states they just worked to improve the strength in his legs. He still uses a rollator if he has to walk far. He has not had to use lasix. Notes some improvement with reduction in Toprol dose. Notes numbness in lower legs.   Current Outpatient Medications on File Prior to Visit  Medication Sig Dispense Refill  . albuterol (VENTOLIN HFA) 108 (90 Base) MCG/ACT inhaler Inhale 2 puffs into the lungs every 4 (four) hours as needed for wheezing or shortness of breath. 18 g 1  . aspirin 81 MG tablet Take 81 mg by mouth at bedtime.     . benzonatate (TESSALON) 100 MG capsule Take 1 or 2 capsules every 8 hours as needed for cough (  Patient not taking: Reported on 01/19/2019) 30 capsule 1  . budesonide-formoterol (SYMBICORT) 160-4.5 MCG/ACT inhaler TAKE 2 PUFFS BY MOUTH TWICE A DAY 30.6 Inhaler 4  . furosemide (LASIX) 20 MG tablet TAKE 1/2 TABLET (10 MG) 1 OR 2 TIMES A DAY AS NEEDED FOR LEG SWELLING 90 tablet 1  . metoprolol succinate (TOPROL XL) 25 MG 24 hr tablet Take 1 tablet (25 mg total) by mouth daily. 90 tablet 1  . NON FORMULARY     . Polyethylene Glycol 3350  (MIRALAX PO) Take by mouth.    . senna-docusate (SENOKOT-S) 8.6-50 MG tablet Take by mouth.    . sulfamethoxazole-trimethoprim (BACTRIM DS) 800-160 MG tablet Take 1 tablet by mouth 2 (two) times daily. (Patient not taking: Reported on 01/19/2019) 14 tablet 0  . tamsulosin (FLOMAX) 0.4 MG CAPS capsule TAKE 2 CAPSULES BY MOUTH EVERY DAY 174 capsule 1  . Tiotropium Bromide Monohydrate (SPIRIVA RESPIMAT) 2.5 MCG/ACT AERS INHALE 2.5 MCG INTO THE LUNGS 2 (TWO) TIMES DAILY. 1 Inhaler 5  . warfarin (COUMADIN) 2 MG tablet TAKE 1 TABLET WITH 5 MG TABLET DAILY AS DIRECTED BY COUMADIN CLINIC 90 tablet 0  . warfarin (COUMADIN) 5 MG tablet TAKE 1 TABLET DAILY AS DIRECTED WITH 2 MG TABLET 90 tablet 1   No current facility-administered medications on file prior to visit.    Allergies  Allergen Reactions  . Codeine Anaphylaxis  . Gadolinium Derivatives Other (See Comments)    Other Reaction: intense sneezing MRI contrast  . Hydrocodone Anaphylaxis  . Iodinated Diagnostic Agents Other (See Comments)    Uncontrollable sneezing, needs sedative the night before and hour and benadryl before using Other Reaction: sneezing with SOB after prep  . Statins Other (See Comments)    Muscle spasm Muscle spasm  Muscle spasm  Myalgias  . Hydromorphone Other (See Comments)    Pt says he has never taken  . Lipitor [Atorvastatin Calcium] Other (See Comments)    Myalgias   . Other     Hydromorphone  . Seroquel [Quetiapine Fumerate] Other (See Comments)    "bad trip" tongue swell    Past Medical History:  Diagnosis Date  . Aortic aneurysm, thoracic ([place])   . Aortic valve disease   . Arthritis   . BPH (benign prostatic hyperplasia)   . COPD (chronic obstructive pulmonary disease) ([place])   . GERD (gastroesophageal reflux disease)   . HTN (hypertension)   . Hypercholesterolemia   . Stroke ([place])   . TIA (transient ischemic attack)     Past Surgical History:  Procedure Laterality Date  . ACHILLES TENDON  REPAIR    . AORTIC VALVE REPLACEMENT     #23 On-X valve conduit  . ASCENDING AORTIC ANEURYSM REPAIR    . BACK SURGERY    . FOOT SURGERY      Social History   Tobacco Use  Smoking Status Never Smoker  Smokeless Tobacco Never Used    Social History   Substance and Sexual Activity  Alcohol Use No  . Alcohol/week: 0.0 standard drinks    Family History  Problem Relation Age of Onset  . Alzheimer's disease Father   . Diabetes Father        age onset DM  . Hypertension Sister   . Hypertension Brother   . Diabetes Mother   . Diabetes Sister   . Colon cancer Neg Hx     Review of Systems: As noted in history of present illness.  All other systems were reviewed and are  negative.  Physical Exam: BP 131/78   Pulse 87   Temp (!) 97.1 F (36.2 C)   Ht 6\' 1"  (1.854 m)   Wt 211 lb 6.4 oz (95.9 kg)   SpO2 99%   BMI 27.89 kg/m  GENERAL:  Elderly WM appears better than before HEENT:  PERRL, EOMI, sclera are clear. Oropharynx is clear. NECK:  No jugular venous distention, carotid upstroke brisk and symmetric, no bruits, no thyromegaly or adenopathy LUNGS:  Clear to auscultation bilaterally CHEST:  nontender HEART:  RRR,  PMI not displaced or sustained, loud mechanical AV click,  no S3, no S4: no clicks, no rubs, no murmurs ABD:  Soft, nontender. BS +, no masses or bruits. No hepatomegaly, no splenomegaly EXT:  2 + pulses throughout, no edema, no cyanosis no clubbing SKIN:  Warm and dry.  No rashes NEURO:  Alert and oriented x 3. Cranial nerves II through XII intact. PSYCH:  Cognitively intact   LABORATORY DATA:  Lab Results  Component Value Date   WBC 7.2 01/10/2019   HGB 9.3 (L) 01/10/2019   HCT 28.4 (L) 01/10/2019   PLT 305 01/10/2019   GLUCOSE 150 (H) 01/10/2019   CHOL 253 (H) 12/10/2016   TRIG 112.0 12/10/2016   HDL 45.00 12/10/2016   LDLDIRECT 172.0 11/14/2014   LDLCALC 185 (H) 12/10/2016   ALT 15 01/10/2019   AST 20 01/10/2019   NA 137 01/10/2019   K 5.2  01/10/2019   CL 98 01/10/2019   CREATININE 1.03 01/10/2019   BUN 14 01/10/2019   CO2 22 01/10/2019   TSH 3.450 01/10/2019   PSA 0.34 11/14/2014   INR 1.7 (A) 03/07/2019   HGBA1C 6.1 12/22/2016     Echo 06/25/16: Study Conclusions  - Procedure narrative: Transthoracic echocardiography. The study   was technically difficult. - Left ventricle: The cavity size was normal. There was mild   concentric hypertrophy. Systolic function was mildly reduced. The   estimated ejection fraction was in the range of 45% to 50%.   Doppler parameters are consistent with abnormal left ventricular   relaxation (grade 1 diastolic dysfunction). - Aortic valve: A mechanical prosthesis was present and functioning   normally. Valve area (Vmax): 1.87 cm^2. - Mitral valve: There was mild regurgitation. - Left atrium: The atrium was moderately dilated. - Right atrium: The atrium was mildly dilated. - Pulmonary arteries: Systolic pressure was within the normal   range.  Myoview 06/25/16: Pharmacological myocardial perfusion imaging study with no significant  ischemia Normal wall motion, EF estimated at 50% No EKG changes concerning for ischemia at peak stress or in recovery. Low risk scan   Signed, Esmond Plants, MD, Ph.D [facility]   Assessment / Plan: 1. Status post mechanical aortic valve replacement with Bentall procedure. On anticoagulation with Coumadin. Echo in July 2020 showed normal valve function. Valve exam is normal.  2. Type B Aortic dissection. Diagnosed in September 2019 with subsequent aneurysmal formation. Followed by Dr. Mart Piggs at [facility].  S/p  total arch replacement and TEVAR procedure on December 02, 2018. Follow up CT on November 24 was satisfactory. Patient reports follow up with Dr Ysidro Evert later this week.  3. Hypertension-controlled. Just on low dose Toprol now 4. Status post CVA/right thalamic- remote. Continue combined aspirin and Coumadin therapy.  5.  Hypercholesterolemia. History of intolerance to lipitor and crestor. Was participating in clinical trial thru primary care.  6. COPD 7. Anemia post op secondary to blood loss. Repeat Hgb in December 9.3.  8. Weakness/imbalance. Some improved but still with significant functional limitation since surgery. Appears to have some orthostatic symptoms. If Ok with Dr Ysidro Evert would consider stopping Toprol. Unable to stop tamsulosin since he cannot void without it. May want to consider Neurologic evaluation.  9. S/p Covid infection at the time of his operation.   Follow up in 3 months.

## 2019-03-14 ENCOUNTER — Ambulatory Visit: Payer: Medicare PPO | Admitting: Cardiology

## 2019-03-14 ENCOUNTER — Other Ambulatory Visit: Payer: Self-pay

## 2019-03-14 ENCOUNTER — Encounter: Payer: Self-pay | Admitting: Cardiology

## 2019-03-14 VITALS — BP 131/78 | HR 87 | Temp 97.1°F | Ht 73.0 in | Wt 211.4 lb

## 2019-03-14 DIAGNOSIS — Z7901 Long term (current) use of anticoagulants: Secondary | ICD-10-CM | POA: Diagnosis not present

## 2019-03-14 DIAGNOSIS — I7103 Dissection of thoracoabdominal aorta: Secondary | ICD-10-CM | POA: Diagnosis not present

## 2019-03-14 DIAGNOSIS — J449 Chronic obstructive pulmonary disease, unspecified: Secondary | ICD-10-CM | POA: Diagnosis not present

## 2019-03-14 DIAGNOSIS — I359 Nonrheumatic aortic valve disorder, unspecified: Secondary | ICD-10-CM

## 2019-03-14 DIAGNOSIS — Z952 Presence of prosthetic heart valve: Secondary | ICD-10-CM | POA: Diagnosis not present

## 2019-03-15 ENCOUNTER — Ambulatory Visit: Payer: Medicare PPO

## 2019-03-15 DIAGNOSIS — R262 Difficulty in walking, not elsewhere classified: Secondary | ICD-10-CM | POA: Diagnosis not present

## 2019-03-15 DIAGNOSIS — M6281 Muscle weakness (generalized): Secondary | ICD-10-CM | POA: Diagnosis not present

## 2019-03-15 NOTE — Therapy (Signed)
[facility] [address] [place], [address] Phone: [phone]   Fax:  [phone]  Physical Therapy Treatment  Patient Details  Name: Alexander Barton MRN: [id number] Date of Birth: 1950-08-27 Referring Provider (PT): [name], MD    Encounter Date: 03/15/2019  PT End of Session - 03/15/19 0803    Visit Number  7    Number of Visits  17    Date for PT Re-Evaluation  04/14/19    Authorization Type  7    Authorization Time Period  of 10 progress report    Authorization - Visit Number  7    Authorization - Number of Visits  16   to 04/19/2019   PT Start Time  0803    PT Stop Time  0845    PT Time Calculation (min)  42 min    Activity Tolerance  Patient tolerated treatment well    Behavior During Therapy  [facility] for tasks assessed/performed       Past Medical History:  Diagnosis Date  . Aortic aneurysm, thoracic ([place])   . Aortic valve disease   . Arthritis   . BPH (benign prostatic hyperplasia)   . COPD (chronic obstructive pulmonary disease) ([place])   . GERD (gastroesophageal reflux disease)   . HTN (hypertension)   . Hypercholesterolemia   . Stroke ([place])   . TIA (transient ischemic attack)     Past Surgical History:  Procedure Laterality Date  . ACHILLES TENDON REPAIR    . AORTIC VALVE REPLACEMENT     #23 On-X valve conduit  . ASCENDING AORTIC ANEURYSM REPAIR    . BACK SURGERY    . FOOT SURGERY      There were no vitals filed for this visit.  Subjective Assessment - 03/15/19 0804    Subjective  Does pretty good with function after doing exercises. However, temporary.    Pertinent History  B LE weakness. Pt states that he does not usually use his rollator but does not know when he will feel out of breath.  Pt has a hx aortic valve replacement 2008. Also had another Sternotomy 12/01/2018. 12/09/2018 pt underwent TEVAR with a L subclavion artery and coratid artery graft.  Pt was able to  ambulate with a walker 2 weeks after the surgeries. Pt also had a UTI after both surgeries. Pt was admitted. Pt was not allowed to walk for 1 week. Pt was also told that pt was going to have neurological issues with his legs and feet but 2 months of PT should take care of that. Pt went home and pt had a crazy walk and felt like he was a "prancing horse. " Pt feels like he is raising his feet high without him being able to control. Had PT at [facility] after his surgery and did well but still had difficulty controlling how high he can lift his legs. Had the difficulty with controlling how high he can lift his legs after his UTI. His legs usually comes high in the middle of the night going to the bathroom (after being in bed for a few hours), as well after sitting for a while (a couple of hours) at times, kind of unpredictable. Pt sits on a chair lift. Pt also has a difficult time with swelling in his feet and ankles. Take lasix to help. Pt sits for a while keeping his feet elevated due to the B LE swelling. B LE swelling may have  started after his UTI. Pt also has a hx of 3 CVA (first 2 were TIAs 2012 and 2014), The third occurence was actually a full infarct/CVA in 2015 which decreased his endurance. Did not notice any weakness. Pt currently unable to play golf which is his leisure activity. Pt could not lift more than 5 lbs after his thoracic surgery in 11/2018. Has not seen Dr. Mali Hughes yet so does not know if his restrictions have been lifted yet. Pt furniture walks. [place] one time around December 15/16, 2020. Pt tried to break his fall and landed on his R side. Does not know if he moved anything in his chest surgery. Did not cal lhis doctor. Denies chest pain or difficulty breathing, lightheadedness or dizziness. Pt states that standing up from a regular chair when pt has to use his arms, pt feels like everything inside him feels like is drained, his legs quiver and has to sit back down. Pt sits down and does  the pursed lip breathing, pt would recover. If pt however does not use his hands, he is fine. Pt also adds numb like feeling B legs.    Patient Stated Goals  Be able to walk normally and play basketball with grandsons like he used to before his surgery in October 2020.    Currently in Pain?  No/denies                               PT Education - 03/15/19 0824    Education Details  ther-ex    Person(s) Educated  Patient    Methods  Explanation;Demonstration;Tactile cues;Verbal cues    Comprehension  Returned demonstration;Verbalized understanding        Objective    MedbridgeAccess Code: [email or web address]   No pacemakers or defibrilators.   No latex allergies    Blood pressure measurements R ARM ONLY  Check blood pressure  Surgical incisions healed.  Able to ambulate without AD short distances safely.   Based on pt subjective: feels like he is drained when pt uses hands to push up from chair to stand. Does not feel drained when he stands without using his hands.     Pt states that he called his cardiac surgeon's office and was told that his only limitation is to not strain.   E-Stim 10 min [place] 1: R tibialis anterior at 74 mA Exercise during 10 seconds on, rest at 10 seconds off Performed to promote ankle DF to promoted R foot clearance during R LE swing phase of gait.   Perform for the L tibialis anterior next visit if appropriate   Therapeutic exercise  Sit <> stand without UE assist, 10x  Cues for bringing center of gravity over base of support, pt allowed to bring arms forward to improve ability to do so. Pt able to perform without UE on chair.    Blood pressure R arm sitting, mechanically taken, normal cuff: 119/57, HR87  SpO299% room air  Seated R ankle DF AAROM with strap assist during E-stim  Static mini lunge with contralateral UE assist   R 5x2  L 5x2  Seated marches 10x each LE   Standing  hip abduction with B UE assist   R 10x2  L 10x2   Therapeutic rest breaks provided to allow pt to recover for his next exercise secondary to fatigue.   Improved exercise technique, movement at target joints, use of target muscles after mod verbal, visual,  tactile cues.      Response to treatment Good tolerance to therapy today.   Clinical impression Added electrical stimulation to R tibialis anterior, [place] setting to promote ability to DF R ankle when ambulating. Continued working on overall LE strengthening, and mechanics with transfers to promote ability to perform functional tasks outside of therapy with less difficulty. Therapeutic restbreaks provided as needed to allow for recovery for next exercise. Pt tolerated session well without aggravation of symptoms. Pt will benefit from continued skilled physical therapy services to improve strength, endurance and function.    PT Short Term Goals - 02/16/19 1244      PT SHORT TERM GOAL #1   Title  Pt will be independent with his HEP to improve strength, balance, and function.    Baseline  Pt has started his HEP (02/16/2019)    Time  3    Period  Weeks    Status  New    Target Date  03/10/19        PT Long Term Goals - 02/16/19 1245      PT LONG TERM GOAL #1   Title  Pt will improve B LE strength by at least 1/2 MMT grade to promote ability to ambulate and perform functional tasks with less difficulty.    Time  8    Period  Weeks    Status  New    Target Date  04/14/19      PT LONG TERM GOAL #2   Title  Patient will be able to ambulate without use of AD at least 500 ft to promote ability to return to PLOF.    Baseline  Currently ambulates longer distances with rollator (02/16/2019)    Time  8    Period  Weeks    Status  New    Target Date  04/14/19      PT LONG TERM GOAL #3   Title  Pt will improve FOTO score by at least 10 points as a demonstration of improved function.    Baseline  LE FOTO 56  (02/16/2019)    Time  8    Period  Weeks    Status  New    Target Date  04/14/19            Plan - 03/15/19 0825    Clinical Impression Statement  Added electrical stimulation to R tibialis anterior, [place] setting to promote ability to DF R ankle when ambulating. Continued working on overall LE strengthening, and mechanics with transfers to promote ability to perform functional tasks outside of therapy with less difficulty. Therapeutic restbreaks provided as needed to allow for recovery for next exercise. Pt tolerated session well without aggravation of symptoms. Pt will benefit from continued skilled physical therapy services to improve strength, endurance and function.    Personal Factors and Comorbidities  Age;Comorbidity 3+    Comorbidities  S/P TEVER, B LE weakness, hx of aortic valve replacement, COPD, CVA    Examination-Activity Limitations  Transfers;Bed Mobility;Lift;Squat;Stairs;Carry;Locomotion Level    Stability/Clinical Decision Making  Stable/Uncomplicated    Rehab Potential  Fair    PT Frequency  2x / week    PT Duration  8 weeks    PT Treatment/Interventions  Therapeutic exercise;Balance training;Neuromuscular re-education;Patient/family education;Manual techniques;Dry needling;Aquatic Therapy;Advice worker;Therapeutic activities    PT Next Visit Plan  hip, knee, ankle, trunk strengthening, femoral control, gait training, manual techniques, modalities PRN    Consulted and Agree with Plan of Care  Patient  Patient will benefit from skilled therapeutic intervention in order to improve the following deficits and impairments:  Postural dysfunction, Improper body mechanics, Difficulty walking, Decreased strength, Decreased endurance, Decreased balance, Abnormal gait  Visit Diagnosis: Muscle weakness (generalized)  Difficulty in walking, not elsewhere classified     Problem List Patient Active Problem List   Diagnosis Date Noted  .  Bilateral finger numbness 01/20/2019  . Headache 01/20/2019  . Night sweats 01/20/2019  . UTI (urinary tract infection) 01/20/2019  . Swelling of male genital structure 01/20/2019  . Educated about COVID-19 virus infection 01/05/2019  . Cardiomyopathy ([place]) 01/05/2019  . COVID-19 11/10/2018  . Fall 05/07/2018  . Abdominal pain 01/04/2018  . Costochondritis 01/04/2018  . Thyroid nodule 01/04/2018  . Dissecting aneurysm of thoracic aorta, Stanford type B ([place]) 11/08/2017  . Anemia 11/08/2017  . Hyponatremia 11/08/2017  . Overweight 07/01/2017  . TIA (transient ischemic attack)   . Stroke ([place])   . GERD (gastroesophageal reflux disease)   . Arthritis   . Neck pain 12/01/2016  . Knee osteoarthritis 12/01/2016  . Physical deconditioning 06/24/2016  . Dyspnea on exertion 06/23/2016  . Chronic pain of right knee 12/18/2015  . Chronic obstructive pulmonary disease ([place]) 11/14/2014  . BPH (benign prostatic hyperplasia) 11/14/2014  . Preventative health care 11/14/2014  . Long term current use of anticoagulant therapy 10/29/2013  . Cerebral infarction ([place]) 06/02/2013  . H/O aortic valve replacement 12/26/2011  . History of aortic arch replacement 01/07/2011  . HTN (hypertension)   . Hyperlipidemia   . Aortic valve disease      Joneen Boers PT, DPT   03/15/2019, 12:46 PM  [facility] [address], [place], [address] Phone: [phone]   Fax:  [phone]  Name: Alexander Barton MRN: [id number] Date of Birth: [birth year]

## 2019-03-16 ENCOUNTER — Ambulatory Visit (INDEPENDENT_AMBULATORY_CARE_PROVIDER_SITE_OTHER): Payer: Medicare PPO | Admitting: Cardiovascular Disease

## 2019-03-16 DIAGNOSIS — I359 Nonrheumatic aortic valve disorder, unspecified: Secondary | ICD-10-CM

## 2019-03-16 DIAGNOSIS — Z7901 Long term (current) use of anticoagulants: Secondary | ICD-10-CM | POA: Diagnosis not present

## 2019-03-16 LAB — POCT INR: INR: 1.8 — AB (ref 2.0–3.0)

## 2019-03-18 DIAGNOSIS — I712 Thoracic aortic aneurysm, without rupture: Secondary | ICD-10-CM | POA: Diagnosis not present

## 2019-03-18 DIAGNOSIS — Z7901 Long term (current) use of anticoagulants: Secondary | ICD-10-CM | POA: Diagnosis not present

## 2019-03-18 DIAGNOSIS — Z952 Presence of prosthetic heart valve: Secondary | ICD-10-CM | POA: Diagnosis not present

## 2019-03-18 DIAGNOSIS — I7100 Dissection of unspecified site of aorta: Secondary | ICD-10-CM | POA: Diagnosis not present

## 2019-03-18 DIAGNOSIS — I4891 Unspecified atrial fibrillation: Secondary | ICD-10-CM | POA: Diagnosis not present

## 2019-03-18 DIAGNOSIS — I7103 Dissection of thoracoabdominal aorta: Secondary | ICD-10-CM | POA: Diagnosis not present

## 2019-03-18 DIAGNOSIS — Z9889 Other specified postprocedural states: Secondary | ICD-10-CM | POA: Diagnosis not present

## 2019-03-18 DIAGNOSIS — Z48812 Encounter for surgical aftercare following surgery on the circulatory system: Secondary | ICD-10-CM | POA: Diagnosis not present

## 2019-03-18 DIAGNOSIS — I7101 Dissection of thoracic aorta: Secondary | ICD-10-CM | POA: Diagnosis not present

## 2019-03-21 ENCOUNTER — Ambulatory Visit: Payer: Medicare PPO

## 2019-03-21 ENCOUNTER — Other Ambulatory Visit: Payer: Self-pay

## 2019-03-21 DIAGNOSIS — R262 Difficulty in walking, not elsewhere classified: Secondary | ICD-10-CM

## 2019-03-21 DIAGNOSIS — M6281 Muscle weakness (generalized): Secondary | ICD-10-CM | POA: Diagnosis not present

## 2019-03-21 NOTE — Therapy (Signed)
[facility] [address] [place], [address] Phone: [phone]   Fax:  [phone]  Physical Therapy Treatment  Patient Details  Name: Alexander Barton MRN: [id number] Date of Birth: 02/15/1950 Referring Provider (PT): [name], MD    Encounter Date: 03/21/2019  PT End of Session - 03/21/19 0807    Visit Number  8    Number of Visits  17    Date for PT Re-Evaluation  04/14/19    Authorization Type  8    Authorization Time Period  of 10 progress report    Authorization - Visit Number  7    Authorization - Number of Visits  16   to 04/19/2019   PT Start Time  0807    PT Stop Time  0853    PT Time Calculation (min)  46 min    Activity Tolerance  Patient tolerated treatment well    Behavior During Therapy  [facility] for tasks assessed/performed       Past Medical History:  Diagnosis Date  . Aortic aneurysm, thoracic ([place])   . Aortic valve disease   . Arthritis   . BPH (benign prostatic hyperplasia)   . COPD (chronic obstructive pulmonary disease) ([place])   . GERD (gastroesophageal reflux disease)   . HTN (hypertension)   . Hypercholesterolemia   . Stroke ([place])   . TIA (transient ischemic attack)     Past Surgical History:  Procedure Laterality Date  . ACHILLES TENDON REPAIR    . AORTIC VALVE REPLACEMENT     #23 On-X valve conduit  . ASCENDING AORTIC ANEURYSM REPAIR    . BACK SURGERY    . FOOT SURGERY      There were no vitals filed for this visit.  Subjective Assessment - 03/21/19 0809    Subjective  Had a CT scan and his heart surgeon told him that there is absolutely nothing that he can to do undo the heart surgery. His biggest problem is his low blood pressure and was taken off the blood pressure medicine.    Pertinent History  B LE weakness. Pt states that he does not usually use his rollator but does not know when he will feel out of breath.  Pt has a hx aortic valve replacement 2008.  Also had another Sternotomy 12/01/2018. 12/09/2018 pt underwent TEVAR with a L subclavion artery and coratid artery graft.  Pt was able to ambulate with a walker 2 weeks after the surgeries. Pt also had a UTI after both surgeries. Pt was admitted. Pt was not allowed to walk for 1 week. Pt was also told that pt was going to have neurological issues with his legs and feet but 2 months of PT should take care of that. Pt went home and pt had a crazy walk and felt like he was a "prancing horse. " Pt feels like he is raising his feet high without him being able to control. Had PT at [facility] after his surgery and did well but still had difficulty controlling how high he can lift his legs. Had the difficulty with controlling how high he can lift his legs after his UTI. His legs usually comes high in the middle of the night going to the bathroom (after being in bed for a few hours), as well after sitting for a while (a couple of hours) at times, kind of unpredictable. Pt sits on a chair lift. Pt also has a difficult time with  swelling in his feet and ankles. Take lasix to help. Pt sits for a while keeping his feet elevated due to the B LE swelling. B LE swelling may have started after his UTI. Pt also has a hx of 3 CVA (first 2 were TIAs 2012 and 2014), The third occurence was actually a full infarct/CVA in 2015 which decreased his endurance. Did not notice any weakness. Pt currently unable to play golf which is his leisure activity. Pt could not lift more than 5 lbs after his thoracic surgery in 11/2018. Has not seen Dr. Mali Hughes yet so does not know if his restrictions have been lifted yet. Pt furniture walks. [place] one time around December 15/16, 2020. Pt tried to break his fall and landed on his R side. Does not know if he moved anything in his chest surgery. Did not cal lhis doctor. Denies chest pain or difficulty breathing, lightheadedness or dizziness. Pt states that standing up from a regular chair when pt has  to use his arms, pt feels like everything inside him feels like is drained, his legs quiver and has to sit back down. Pt sits down and does the pursed lip breathing, pt would recover. If pt however does not use his hands, he is fine. Pt also adds numb like feeling B legs.    Patient Stated Goals  Be able to walk normally and play basketball with grandsons like he used to before his surgery in October 2020.    Currently in Pain?  No/denies                               PT Education - 03/21/19 0827    Education Details  ther-ex    Person(s) Educated  Patient    Methods  Explanation;Demonstration;Tactile cues;Verbal cues    Comprehension  Returned demonstration;Verbalized understanding      Objective    MedbridgeAccess Code: [email or web address]   No pacemakers or defibrilators.   No latex allergies    Blood pressure measurements R ARM ONLY  Check blood pressure  Surgical incisions healed.  Able to ambulate without AD short distances safely.   Based on pt subjective: feels like he is drained when pt uses hands to push up from chair to stand. Does not feel drained when he stands without using his hands.     Pt states that he called his cardiac surgeon's office and was told that his only limitation is to not strain.   E-Stim 10 min Chanel 1: R tibialis anterior at 65 mA; Channel 2: L tibialis anterior 71 mA Exercise during 10 seconds on, rest at 10 seconds off Performed to promote ankle DF to promoted R foot clearance during R LE swing phase of gait.    Pt states that the E-stim helps his ankles feel stronger  Therapeutic exercise   Blood pressure R arm sitting, mechanically taken, normal cuff: 128/65, HR93  SpO299% room air  Seated R and L ankle DF AAROM with strap assist during E-stim  Seated marches 10x each LE   Standing hip abduction with B UE assist              R 10x2             L 10x2  Forward step ups  with one UE   R 10x2  L 10x2   Static mini lunge with contralateral UE assist  R 5x             L 5x   Therapeutic rest breaks provided to allow pt to recover for his next exercise secondary to fatigue.   Improved exercise technique, movement at target joints, use of target muscles after mod verbal, visual, tactile cues.      Response to treatment Good tolerance to therapy today.  Clinical impression  Pt seems more energetic today with the change of blood pressure medicine, and pt systolic numbers at more normal levels. Continued working on improving bilateral tibialis anterior muscle strength to promote heel strike and foot clearance during gait. Continued working on overall LE strengthening to promote balance and function. Pt tolerated session well without aggravation of symptoms. Pt will benefit from continued skilled physical therapy services to improve strength and function.       PT Short Term Goals - 02/16/19 1244      PT SHORT TERM GOAL #1   Title  Pt will be independent with his HEP to improve strength, balance, and function.    Baseline  Pt has started his HEP (02/16/2019)    Time  3    Period  Weeks    Status  New    Target Date  03/10/19        PT Long Term Goals - 02/16/19 1245      PT LONG TERM GOAL #1   Title  Pt will improve B LE strength by at least 1/2 MMT grade to promote ability to ambulate and perform functional tasks with less difficulty.    Time  8    Period  Weeks    Status  New    Target Date  04/14/19      PT LONG TERM GOAL #2   Title  Patient will be able to ambulate without use of AD at least 500 ft to promote ability to return to PLOF.    Baseline  Currently ambulates longer distances with rollator (02/16/2019)    Time  8    Period  Weeks    Status  New    Target Date  04/14/19      PT LONG TERM GOAL #3   Title  Pt will improve FOTO score by at least 10 points as a demonstration of improved function.     Baseline  LE FOTO 56 (02/16/2019)    Time  8    Period  Weeks    Status  New    Target Date  04/14/19            Plan - 03/21/19 0809    Clinical Impression Statement  Pt seems more energetic today with the change of blood pressure medicine, and pt systolic numbers at more normal levels. Continued working on improving bilateral tibialis anterior muscle strength to promote heel strike and foot clearance during gait. Continued working on overall LE strengthening to promote balance and function. Pt tolerated session well without aggravation of symptoms. Pt will benefit from continued skilled physical therapy services to improve strength and function.    Personal Factors and Comorbidities  Age;Comorbidity 3+    Comorbidities  S/P TEVER, B LE weakness, hx of aortic valve replacement, COPD, CVA    Examination-Activity Limitations  Transfers;Bed Mobility;Lift;Squat;Stairs;Carry;Locomotion Level    Stability/Clinical Decision Making  Stable/Uncomplicated    Rehab Potential  Fair    PT Frequency  2x / week    PT Duration  8 weeks    PT Treatment/Interventions  Therapeutic exercise;Balance  training;Neuromuscular re-education;Patient/family education;Manual techniques;Dry needling;Aquatic Therapy;Advice worker;Therapeutic activities    PT Next Visit Plan  hip, knee, ankle, trunk strengthening, femoral control, gait training, manual techniques, modalities PRN    Consulted and Agree with Plan of Care  Patient       Patient will benefit from skilled therapeutic intervention in order to improve the following deficits and impairments:  Postural dysfunction, Improper body mechanics, Difficulty walking, Decreased strength, Decreased endurance, Decreased balance, Abnormal gait  Visit Diagnosis: Muscle weakness (generalized)  Difficulty in walking, not elsewhere classified     Problem List Patient Active Problem List   Diagnosis Date Noted  . Bilateral finger numbness  01/20/2019  . Headache 01/20/2019  . Night sweats 01/20/2019  . UTI (urinary tract infection) 01/20/2019  . Swelling of male genital structure 01/20/2019  . Educated about COVID-19 virus infection 01/05/2019  . Cardiomyopathy ([place]) 01/05/2019  . COVID-19 11/10/2018  . Fall 05/07/2018  . Abdominal pain 01/04/2018  . Costochondritis 01/04/2018  . Thyroid nodule 01/04/2018  . Dissecting aneurysm of thoracic aorta, Stanford type B ([place]) 11/08/2017  . Anemia 11/08/2017  . Hyponatremia 11/08/2017  . Overweight 07/01/2017  . TIA (transient ischemic attack)   . Stroke ([place])   . GERD (gastroesophageal reflux disease)   . Arthritis   . Neck pain 12/01/2016  . Knee osteoarthritis 12/01/2016  . Physical deconditioning 06/24/2016  . Dyspnea on exertion 06/23/2016  . Chronic pain of right knee 12/18/2015  . Chronic obstructive pulmonary disease ([place]) 11/14/2014  . BPH (benign prostatic hyperplasia) 11/14/2014  . Preventative health care 11/14/2014  . Long term current use of anticoagulant therapy 10/29/2013  . Cerebral infarction ([place]) 06/02/2013  . H/O aortic valve replacement 12/26/2011  . History of aortic arch replacement 01/07/2011  . HTN (hypertension)   . Hyperlipidemia   . Aortic valve disease     Joneen Boers PT, DPT   03/21/2019, 10:24 AM  [facility] [address] [place], [address] Phone: [phone]   Fax:  [phone]  Name: Alexander Barton MRN: [id number] Date of Birth: October 24, 1950

## 2019-03-23 ENCOUNTER — Ambulatory Visit: Payer: Medicare PPO

## 2019-03-28 ENCOUNTER — Ambulatory Visit: Payer: Medicare PPO

## 2019-03-28 ENCOUNTER — Other Ambulatory Visit: Payer: Self-pay

## 2019-03-28 DIAGNOSIS — M6281 Muscle weakness (generalized): Secondary | ICD-10-CM

## 2019-03-28 DIAGNOSIS — R262 Difficulty in walking, not elsewhere classified: Secondary | ICD-10-CM | POA: Diagnosis not present

## 2019-03-28 NOTE — Therapy (Signed)
[facility] [address] [place], [address] Phone: [phone]   Fax:  [phone]  Physical Therapy Treatment  Patient Details  Name: Alexander Barton MRN: [id number] Date of Birth: [birth year] Referring Provider (PT): [name], MD    Encounter Date: 03/28/2019  PT End of Session - 03/28/19 0803    Visit Number  9    Number of Visits  17    Date for PT Re-Evaluation  04/14/19    Authorization Type  9    Authorization Time Period  of 10 progress report    Authorization - Visit Number  9    Authorization - Number of Visits  16   to 04/19/2019   PT Start Time  0803    PT Stop Time  0848    PT Time Calculation (min)  45 min    Activity Tolerance  Patient tolerated treatment well    Behavior During Therapy  [facility] for tasks assessed/performed       Past Medical History:  Diagnosis Date  . Aortic aneurysm, thoracic ([place])   . Aortic valve disease   . Arthritis   . BPH (benign prostatic hyperplasia)   . COPD (chronic obstructive pulmonary disease) ([place])   . GERD (gastroesophageal reflux disease)   . HTN (hypertension)   . Hypercholesterolemia   . Stroke ([place])   . TIA (transient ischemic attack)     Past Surgical History:  Procedure Laterality Date  . ACHILLES TENDON REPAIR    . AORTIC VALVE REPLACEMENT     #23 On-X valve conduit  . ASCENDING AORTIC ANEURYSM REPAIR    . BACK SURGERY    . FOOT SURGERY      There were no vitals filed for this visit.  Subjective Assessment - 03/28/19 0816    Subjective  Doing pretty good. Wants to try upper body workouts so he knows what exercises to do at the gym.    Pertinent History  B LE weakness. Pt states that he does not usually use his rollator but does not know when he will feel out of breath.  Pt has a hx aortic valve replacement 2008. Also had another Sternotomy 12/01/2018. 12/09/2018 pt underwent TEVAR with a L subclavion artery and coratid artery  graft.  Pt was able to ambulate with a walker 2 weeks after the surgeries. Pt also had a UTI after both surgeries. Pt was admitted. Pt was not allowed to walk for 1 week. Pt was also told that pt was going to have neurological issues with his legs and feet but 2 months of PT should take care of that. Pt went home and pt had a crazy walk and felt like he was a "prancing horse. " Pt feels like he is raising his feet high without him being able to control. Had PT at [facility] after his surgery and did well but still had difficulty controlling how high he can lift his legs. Had the difficulty with controlling how high he can lift his legs after his UTI. His legs usually comes high in the middle of the night going to the bathroom (after being in bed for a few hours), as well after sitting for a while (a couple of hours) at times, kind of unpredictable. Pt sits on a chair lift. Pt also has a difficult time with swelling in his feet and ankles. Take lasix to help. Pt sits for a while keeping his feet elevated due to  the B LE swelling. B LE swelling may have started after his UTI. Pt also has a hx of 3 CVA (first 2 were TIAs 2012 and 2014), The third occurence was actually a full infarct/CVA in 2015 which decreased his endurance. Did not notice any weakness. Pt currently unable to play golf which is his leisure activity. Pt could not lift more than 5 lbs after his thoracic surgery in 11/2018. Has not seen Dr. Mali Hughes yet so does not know if his restrictions have been lifted yet. Pt furniture walks. [place] one time around December 15/16, 2020. Pt tried to break his fall and landed on his R side. Does not know if he moved anything in his chest surgery. Did not cal lhis doctor. Denies chest pain or difficulty breathing, lightheadedness or dizziness. Pt states that standing up from a regular chair when pt has to use his arms, pt feels like everything inside him feels like is drained, his legs quiver and has to sit back  down. Pt sits down and does the pursed lip breathing, pt would recover. If pt however does not use his hands, he is fine. Pt also adds numb like feeling B legs.    Patient Stated Goals  Be able to walk normally and play basketball with grandsons like he used to before his surgery in October 2020.    Currently in Pain?  No/denies                               PT Education - 03/28/19 0820    Education Details  ther-ex    Person(s) Educated  Patient    Methods  Explanation;Demonstration;Tactile cues;Verbal cues    Comprehension  Returned demonstration;Verbalized understanding       Objective    MedbridgeAccess Code: [email or web address]   No pacemakers or defibrilators.   No latex allergies    Blood pressure measurements R ARM ONLY  Check blood pressure  Surgical incisions healed.  Able to ambulate without AD short distances safely.    Pt states that he called his cardiac surgeon's office and was told that his only limitation is to not strain.    E-Stim15 min Chanel 1: R tibialis anterior at 68 mA; Channel 2: L tibialis anterior 70 mA Exercise during 10 seconds on, rest at 10 seconds off Performed to promote ankle DF to promoted R foot clearance during R LE swing phase of gait.   Pt states that the E-stim helps his ankles feel stronger  Therapeutic exercise   Blood pressure R arm sitting, mechanically taken, normal cuff: 1116/67, HR96  SpO298% room air  Seated R and L ankle DF AAROM with strap assistduring E-stim  Standing B scapular retraction red band   10x3, then 8x  Give as part of HEP next visit if appropriate  Standing B shoulder extension red band with scapular retraction   10x  Decreased pectoralis tightness felt by pt.   Standing hip abduction with B UE assist  R 10x2 L 10x2   Static mini lunge with contralateral UE assist  R 5x L  5x   Improved exercise technique, movement at target joints, use of target muscles after mod verbal, visual, tactile cues.      Response to treatment Good tolerance to therapy today.  Clinical impression Able to perform exercises without sitting rest breaks. Improving overall endurance and activity tolerance observed at the clinic. Continued working on overall LE strengthening to promote  balance and ability to perform standing tasks. Added UE scapular exercises in standing to promote LE strength in supporting body while pt performs upper extremity tasks. No complain pain throughout session. Pt will benefit from continued skilled physical therapy services to decrease pain, improve strength, and function.     PT Short Term Goals - 02/16/19 1244      PT SHORT TERM GOAL #1   Title  Pt will be independent with his HEP to improve strength, balance, and function.    Baseline  Pt has started his HEP (02/16/2019)    Time  3    Period  Weeks    Status  New    Target Date  03/10/19        PT Long Term Goals - 02/16/19 1245      PT LONG TERM GOAL #1   Title  Pt will improve B LE strength by at least 1/2 MMT grade to promote ability to ambulate and perform functional tasks with less difficulty.    Time  8    Period  Weeks    Status  New    Target Date  04/14/19      PT LONG TERM GOAL #2   Title  Patient will be able to ambulate without use of AD at least 500 ft to promote ability to return to PLOF.    Baseline  Currently ambulates longer distances with rollator (02/16/2019)    Time  8    Period  Weeks    Status  New    Target Date  04/14/19      PT LONG TERM GOAL #3   Title  Pt will improve FOTO score by at least 10 points as a demonstration of improved function.    Baseline  LE FOTO 56 (02/16/2019)    Time  8    Period  Weeks    Status  New    Target Date  04/14/19            Plan - 03/28/19 [id number]    Clinical Impression Statement  Able to perform exercises  without sitting rest breaks. Improving overall endurance and activity tolerance observed at the clinic. Continued working on overall LE strengthening to promote balance and ability to perform standing tasks. Added UE scapular exercises in standing to promote LE strength in supporting body while pt performs upper extremity tasks. No complain pain throughout session. Pt will benefit from continued skilled physical therapy services to decrease pain, improve strength, and function.    Personal Factors and Comorbidities  Age;Comorbidity 3+    Comorbidities  S/P TEVER, B LE weakness, hx of aortic valve replacement, COPD, CVA    Examination-Activity Limitations  Transfers;Bed Mobility;Lift;Squat;Stairs;Carry;Locomotion Level    Stability/Clinical Decision Making  Stable/Uncomplicated    Rehab Potential  Fair    PT Frequency  2x / week    PT Duration  8 weeks    PT Treatment/Interventions  Therapeutic exercise;Balance training;Neuromuscular re-education;Patient/family education;Manual techniques;Dry needling;Aquatic Therapy;Advice worker;Therapeutic activities    PT Next Visit Plan  hip, knee, ankle, trunk strengthening, femoral control, gait training, manual techniques, modalities PRN    Consulted and Agree with Plan of Care  Patient       Patient will benefit from skilled therapeutic intervention in order to improve the following deficits and impairments:  Postural dysfunction, Improper body mechanics, Difficulty walking, Decreased strength, Decreased endurance, Decreased balance, Abnormal gait  Visit Diagnosis: Muscle weakness (generalized)  Difficulty in walking, not elsewhere classified  Problem List Patient Active Problem List   Diagnosis Date Noted  . Bilateral finger numbness 01/20/2019  . Headache 01/20/2019  . Night sweats 01/20/2019  . UTI (urinary tract infection) 01/20/2019  . Swelling of male genital structure 01/20/2019  . Educated about COVID-19 virus  infection 01/05/2019  . Cardiomyopathy ([place]) 01/05/2019  . COVID-19 11/10/2018  . Fall 05/07/2018  . Abdominal pain 01/04/2018  . Costochondritis 01/04/2018  . Thyroid nodule 01/04/2018  . Dissecting aneurysm of thoracic aorta, Stanford type B ([place]) 11/08/2017  . Anemia 11/08/2017  . Hyponatremia 11/08/2017  . Overweight 07/01/2017  . TIA (transient ischemic attack)   . Stroke ([place])   . GERD (gastroesophageal reflux disease)   . Arthritis   . Neck pain 12/01/2016  . Knee osteoarthritis 12/01/2016  . Physical deconditioning 06/24/2016  . Dyspnea on exertion 06/23/2016  . Chronic pain of right knee 12/18/2015  . Chronic obstructive pulmonary disease ([place]) 11/14/2014  . BPH (benign prostatic hyperplasia) 11/14/2014  . Preventative health care 11/14/2014  . Long term current use of anticoagulant therapy 10/29/2013  . Cerebral infarction ([place]) 06/02/2013  . H/O aortic valve replacement 12/26/2011  . History of aortic arch replacement 01/07/2011  . HTN (hypertension)   . Hyperlipidemia   . Aortic valve disease     Joneen Boers PT, DPT   03/28/2019, 10:01 AM  [facility] [address] [place], [address] Phone: [phone]   Fax:  [phone]  Name: DEMAURY BARAHONA MRN: [id number] Date of Birth: April 29, 1950

## 2019-03-31 ENCOUNTER — Ambulatory Visit: Payer: Medicare PPO

## 2019-03-31 ENCOUNTER — Other Ambulatory Visit: Payer: Self-pay

## 2019-03-31 DIAGNOSIS — M6281 Muscle weakness (generalized): Secondary | ICD-10-CM | POA: Diagnosis not present

## 2019-03-31 DIAGNOSIS — R262 Difficulty in walking, not elsewhere classified: Secondary | ICD-10-CM | POA: Diagnosis not present

## 2019-03-31 NOTE — Therapy (Signed)
[facility] [address] [place], [address] Phone: [phone]   Fax:  [phone]  Physical Therapy Treatment And Progress Report (02/16/2019 - 03/31/2019)  Patient Details  Name: Alexander Barton MRN: [id number] Date of Birth: 1950-11-21 Referring Provider (PT): [name], MD    Encounter Date: 03/31/2019  PT End of Session - 03/31/19 0803    Visit Number  10    Number of Visits  17    Date for PT Re-Evaluation  04/14/19    Authorization Type  10    Authorization Time Period  of 10 progress report    Authorization - Visit Number  10    Authorization - Number of Visits  16   to 04/19/2019   PT Start Time  0803    PT Stop Time  0848    PT Time Calculation (min)  45 min    Activity Tolerance  Patient tolerated treatment well    Behavior During Therapy  [facility] for tasks assessed/performed       Past Medical History:  Diagnosis Date  . Aortic aneurysm, thoracic ([place])   . Aortic valve disease   . Arthritis   . BPH (benign prostatic hyperplasia)   . COPD (chronic obstructive pulmonary disease) ([place])   . GERD (gastroesophageal reflux disease)   . HTN (hypertension)   . Hypercholesterolemia   . Stroke ([place])   . TIA (transient ischemic attack)     Past Surgical History:  Procedure Laterality Date  . ACHILLES TENDON REPAIR    . AORTIC VALVE REPLACEMENT     #23 On-X valve conduit  . ASCENDING AORTIC ANEURYSM REPAIR    . BACK SURGERY    . FOOT SURGERY      There were no vitals filed for this visit.  Subjective Assessment - 03/31/19 0804    Subjective  Pt states feeling sore from moving wood around. It was a good workout for him. Was able to do more than he thought he was able to do. 25 boxes of wood flooring.  Low back soreness.    Pertinent History  B LE weakness. Pt states that he does not usually use his rollator but does not know when he will feel out of breath.  Pt has a hx aortic valve  replacement 2008. Also had another Sternotomy 12/01/2018. 12/09/2018 pt underwent TEVAR with a L subclavion artery and coratid artery graft.  Pt was able to ambulate with a walker 2 weeks after the surgeries. Pt also had a UTI after both surgeries. Pt was admitted. Pt was not allowed to walk for 1 week. Pt was also told that pt was going to have neurological issues with his legs and feet but 2 months of PT should take care of that. Pt went home and pt had a crazy walk and felt like he was a "prancing horse. " Pt feels like he is raising his feet high without him being able to control. Had PT at [facility] after his surgery and did well but still had difficulty controlling how high he can lift his legs. Had the difficulty with controlling how high he can lift his legs after his UTI. His legs usually comes high in the middle of the night going to the bathroom (after being in bed for a few hours), as well after sitting for a while (a couple of hours) at times, kind of unpredictable. Pt sits on a chair lift. Pt also has a  difficult time with swelling in his feet and ankles. Take lasix to help. Pt sits for a while keeping his feet elevated due to the B LE swelling. B LE swelling may have started after his UTI. Pt also has a hx of 3 CVA (first 2 were TIAs 2012 and 2014), The third occurence was actually a full infarct/CVA in 2015 which decreased his endurance. Did not notice any weakness. Pt currently unable to play golf which is his leisure activity. Pt could not lift more than 5 lbs after his thoracic surgery in 11/2018. Has not seen Dr. Mali Hughes yet so does not know if his restrictions have been lifted yet. Pt furniture walks. [place] one time around December 15/16, 2020. Pt tried to break his fall and landed on his R side. Does not know if he moved anything in his chest surgery. Did not cal lhis doctor. Denies chest pain or difficulty breathing, lightheadedness or dizziness. Pt states that standing up from a regular  chair when pt has to use his arms, pt feels like everything inside him feels like is drained, his legs quiver and has to sit back down. Pt sits down and does the pursed lip breathing, pt would recover. If pt however does not use his hands, he is fine. Pt also adds numb like feeling B legs.    Patient Stated Goals  Be able to walk normally and play basketball with grandsons like he used to before his surgery in October 2020.    Currently in Pain?  No/denies         [facility] PT Assessment - 03/31/19 0827      Strength   Right Hip Flexion  4+/5    Right Hip Extension  5/5   seated manually resisted   Right Hip ABduction  5/5   seated manually resisted clamshell   Left Hip Flexion  4+/5    Left Hip Extension  4+/5   seated manually resisted   Left Hip ABduction  5/5   seated manually resisted clamshell   Right Knee Flexion  5/5    Right Knee Extension  5/5    Left Knee Flexion  5/5    Left Knee Extension  5/5    Right Ankle Dorsiflexion  2-/5    Right Ankle Plantar Flexion  4+/5   seated manually resisted   Left Ankle Dorsiflexion  2-/5    Left Ankle Plantar Flexion  4+/5   seated manually resisted                          PT Education - 03/31/19 0819    Education Details  ther-ex    Person(s) Educated  Patient    Methods  Explanation;Demonstration;Tactile cues;Verbal cues    Comprehension  Returned demonstration;Verbalized understanding        Objective    MedbridgeAccess Code: [email or web address]   No pacemakers or defibrilators.   No latex allergies    Blood pressure measurements R ARM ONLY  Check blood pressure  Surgical incisions healed.    Pt states that he called his cardiac surgeon's office and was told that his only limitation is to not strain.    E-Stim15 min Chanel 1: R tibialis anterior at60m; Channel 2: L tibialis anterior 76 mA Exercise during 10 seconds on, rest at 10 seconds off Performed to promote  ankle DF to promoted foot clearance during LE swing phase of gait.   Pt states that the E-stim helps  his ankles feel stronger  Therapeutic exercise   Blood pressure R arm sitting, mechanically taken, normal cuff: 120/55, HR66  SpO299% room air  Seated manually resisted hip flexion, extension, clamshell isometrics, knee flexion, knee extension, ankle DF, ankle PF 1-2x each way for each LE   Gait x 500 ft without AD  Able to perform with moderate fatigue.   Foot slap gait pattern secondary to ankle DF weakness.   Sitting posture: R lumbar rotation   Seated manually resisted R trunk rotation to promote L lower trunk rotation 10x5 seconds for 2 sets  More controlled L hip flexion during gait for L foot clearance.   Improved exercise technique, movement at target joints, use of target muscles after mod verbal, visual, tactile cues.      Response to treatment Good tolerance to therapy and activity. No complain of pain.   Clinical impression Pt demonstrates overall improved B LE strength, function, activity tolerance and ability to amulate longer distances without use of AD. Pt making very good progress with PT towards goals. Pt will benefit from continued skilled physical therapy services to continue to improve strength, gait, endurance, and function.      PT Short Term Goals - 03/31/19 0825      PT SHORT TERM GOAL #1   Title  Pt will be independent with his HEP to improve strength, balance, and function.    Baseline  Pt has started his HEP (02/16/2019); pt performing some of his HEP. No questions (03/31/2019)    Time  3    Period  Weeks    Status  Partially Met    Target Date  03/10/19        PT Long Term Goals - 03/31/19 0824      PT LONG TERM GOAL #1   Title  Pt will improve B LE strength by at least 1/2 MMT grade to promote ability to ambulate and perform functional tasks with less difficulty.    Time  8    Period  Weeks    Status  Achieved     Target Date  04/14/19      PT LONG TERM GOAL #2   Title  Patient will be able to ambulate without use of AD at least 500 ft to promote ability to return to PLOF.    Baseline  Currently ambulates longer distances with rollator (02/16/2019)    Time  8    Period  Weeks    Status  Achieved    Target Date  03/17/19      PT LONG TERM GOAL #3   Title  Pt will improve FOTO score by at least 10 points as a demonstration of improved function.    Baseline  LE FOTO 56 (02/16/2019); 59 (03/31/2019)    Time  8    Period  Weeks    Status  Partially Met    Target Date  04/14/19            Plan - 03/31/19 0801    Clinical Impression Statement  Pt demonstrates overall improved B LE strength, function, activity tolerance and ability to amulate longer distances without use of AD. Pt making very good progress with PT towards goals. Pt will benefit from continued skilled physical therapy services to continue to improve strength, gait, endurance, and function.    Personal Factors and Comorbidities  Age;Comorbidity 3+    Comorbidities  S/P TEVER, B LE weakness, hx of aortic valve replacement, COPD, CVA  Examination-Activity Limitations  Transfers;Bed Mobility;Lift;Squat;Stairs;Carry;Locomotion Level    Stability/Clinical Decision Making  Stable/Uncomplicated    Clinical Decision Making  Low    Rehab Potential  Fair    PT Frequency  2x / week    PT Duration  8 weeks    PT Treatment/Interventions  Therapeutic exercise;Balance training;Neuromuscular re-education;Patient/family education;Manual techniques;Dry needling;Aquatic Therapy;Advice worker;Therapeutic activities    PT Next Visit Plan  hip, knee, ankle, trunk strengthening, femoral control, gait training, manual techniques, modalities PRN    Consulted and Agree with Plan of Care  Patient       Patient will benefit from skilled therapeutic intervention in order to improve the following deficits and impairments:  Postural  dysfunction, Improper body mechanics, Difficulty walking, Decreased strength, Decreased endurance, Decreased balance, Abnormal gait  Visit Diagnosis: Muscle weakness (generalized)  Difficulty in walking, not elsewhere classified     Problem List Patient Active Problem List   Diagnosis Date Noted  . Bilateral finger numbness 01/20/2019  . Headache 01/20/2019  . Night sweats 01/20/2019  . UTI (urinary tract infection) 01/20/2019  . Swelling of male genital structure 01/20/2019  . Educated about COVID-19 virus infection 01/05/2019  . Cardiomyopathy ([place]) 01/05/2019  . COVID-19 11/10/2018  . Fall 05/07/2018  . Abdominal pain 01/04/2018  . Costochondritis 01/04/2018  . Thyroid nodule 01/04/2018  . Dissecting aneurysm of thoracic aorta, Stanford type B ([place]) 11/08/2017  . Anemia 11/08/2017  . Hyponatremia 11/08/2017  . Overweight 07/01/2017  . TIA (transient ischemic attack)   . Stroke ([place])   . GERD (gastroesophageal reflux disease)   . Arthritis   . Neck pain 12/01/2016  . Knee osteoarthritis 12/01/2016  . Physical deconditioning 06/24/2016  . Dyspnea on exertion 06/23/2016  . Chronic pain of right knee 12/18/2015  . Chronic obstructive pulmonary disease ([place]) 11/14/2014  . BPH (benign prostatic hyperplasia) 11/14/2014  . Preventative health care 11/14/2014  . Long term current use of anticoagulant therapy 10/29/2013  . Cerebral infarction ([place]) 06/02/2013  . H/O aortic valve replacement 12/26/2011  . History of aortic arch replacement 01/07/2011  . HTN (hypertension)   . Hyperlipidemia   . Aortic valve disease     Thank you for your referral.   Joneen Boers PT, DPT  03/31/2019, 12:41 PM  [facility] [address] [place], [address] Phone: [phone]   Fax:  [phone]  Name: Alexander Barton MRN: [id number] Date of Birth: 29-Feb-1950

## 2019-04-01 DIAGNOSIS — Z7901 Long term (current) use of anticoagulants: Secondary | ICD-10-CM | POA: Diagnosis not present

## 2019-04-01 LAB — POCT INR: INR: 2 (ref 2.0–3.0)

## 2019-04-05 ENCOUNTER — Other Ambulatory Visit: Payer: Self-pay

## 2019-04-05 ENCOUNTER — Ambulatory Visit (INDEPENDENT_AMBULATORY_CARE_PROVIDER_SITE_OTHER): Payer: Medicare PPO | Admitting: Pharmacist Clinician (PhC)/ Clinical Pharmacy Specialist

## 2019-04-05 ENCOUNTER — Ambulatory Visit: Payer: Medicare PPO | Attending: Family Medicine

## 2019-04-05 ENCOUNTER — Other Ambulatory Visit: Payer: Self-pay | Admitting: Family Medicine

## 2019-04-05 DIAGNOSIS — I639 Cerebral infarction, unspecified: Secondary | ICD-10-CM

## 2019-04-05 DIAGNOSIS — R262 Difficulty in walking, not elsewhere classified: Secondary | ICD-10-CM | POA: Diagnosis not present

## 2019-04-05 DIAGNOSIS — M6281 Muscle weakness (generalized): Secondary | ICD-10-CM | POA: Diagnosis not present

## 2019-04-05 DIAGNOSIS — I359 Nonrheumatic aortic valve disorder, unspecified: Secondary | ICD-10-CM

## 2019-04-05 DIAGNOSIS — Z7901 Long term (current) use of anticoagulants: Secondary | ICD-10-CM | POA: Diagnosis not present

## 2019-04-05 DIAGNOSIS — Z952 Presence of prosthetic heart valve: Secondary | ICD-10-CM | POA: Diagnosis not present

## 2019-04-05 LAB — POCT INR: INR: 2 (ref 2.0–3.0)

## 2019-04-05 NOTE — Patient Instructions (Signed)
Access Code: [email or web address]  URL: [email or web address]  Date: 04/05/2019  Prepared by: Joneen Boers   Exercises Seated Heel Raise - 10 reps - 3 sets - 5 seconds hold - 3x daily - 7x weekly Seated Hamstring Curl with Anchored Resistance - 10 reps - 3 sets - 1x daily - 7x weekly Long Sitting Calf Stretch with Strap - 10 reps - 2 sets - 5 seconds hold - 1x daily - 7x weekly Shoulder extension with resistance - Neutral - 10 reps - 2 sets - 1x daily - 7x weekly Seated Hip Adduction Isometrics with Ball - 10 reps - 3 sets - 5 seconds hold - 1x daily - 7x weekly

## 2019-04-05 NOTE — Therapy (Signed)
[facility] [address] [place], [address] Phone: [phone]   Fax:  [phone]  Physical Therapy Treatment  Patient Details  Name: Alexander Barton MRN: [id number] Date of Birth: [birth year] Referring Provider (PT): [name], MD    Encounter Date: 04/05/2019  PT End of Session - 04/05/19 0937    Visit Number  11    Number of Visits  17    Date for PT Re-Evaluation  04/14/19    Authorization Type  1    Authorization Time Period  of 10 progress report    Authorization - Visit Number  10    Authorization - Number of Visits  16   to 04/19/2019   PT Start Time  0937    PT Stop Time  1028    PT Time Calculation (min)  51 min    Activity Tolerance  Patient tolerated treatment well;Treatment limited secondary to medical complications (Comment)    Behavior During Therapy  [facility] for tasks assessed/performed       Past Medical History:  Diagnosis Date  . Aortic aneurysm, thoracic ([place])   . Aortic valve disease   . Arthritis   . BPH (benign prostatic hyperplasia)   . COPD (chronic obstructive pulmonary disease) ([place])   . GERD (gastroesophageal reflux disease)   . HTN (hypertension)   . Hypercholesterolemia   . Stroke ([place])   . TIA (transient ischemic attack)     Past Surgical History:  Procedure Laterality Date  . ACHILLES TENDON REPAIR    . AORTIC VALVE REPLACEMENT     #23 On-X valve conduit  . ASCENDING AORTIC ANEURYSM REPAIR    . BACK SURGERY    . FOOT SURGERY      There were no vitals filed for this visit.  Subjective Assessment - 04/05/19 0939    Subjective  Pt states working hard moving furniture due to getting his flooring done. Felt a workout. Feels low back and B arm (L > R) soreness. Feels more control on his LE at times. Not as concenred at stubbing his toes as much on the carpet.    Pertinent History  B LE weakness. Pt states that he does not usually use his rollator but does  not know when he will feel out of breath.  Pt has a hx aortic valve replacement 2008. Also had another Sternotomy 12/01/2018. 12/09/2018 pt underwent TEVAR with a L subclavion artery and coratid artery graft.  Pt was able to ambulate with a walker 2 weeks after the surgeries. Pt also had a UTI after both surgeries. Pt was admitted. Pt was not allowed to walk for 1 week. Pt was also told that pt was going to have neurological issues with his legs and feet but 2 months of PT should take care of that. Pt went home and pt had a crazy walk and felt like he was a "prancing horse. " Pt feels like he is raising his feet high without him being able to control. Had PT at [facility] after his surgery and did well but still had difficulty controlling how high he can lift his legs. Had the difficulty with controlling how high he can lift his legs after his UTI. His legs usually comes high in the middle of the night going to the bathroom (after being in bed for a few hours), as well after sitting for a while (a couple of hours) at times, kind of unpredictable. Pt sits  on a chair lift. Pt also has a difficult time with swelling in his feet and ankles. Take lasix to help. Pt sits for a while keeping his feet elevated due to the B LE swelling. B LE swelling may have started after his UTI. Pt also has a hx of 3 CVA (first 2 were TIAs 2012 and 2014), The third occurence was actually a full infarct/CVA in 2015 which decreased his endurance. Did not notice any weakness. Pt currently unable to play golf which is his leisure activity. Pt could not lift more than 5 lbs after his thoracic surgery in 11/2018. Has not seen Dr. Mali Hughes yet so does not know if his restrictions have been lifted yet. Pt furniture walks. [place] one time around December 15/16, 2020. Pt tried to break his fall and landed on his R side. Does not know if he moved anything in his chest surgery. Did not cal lhis doctor. Denies chest pain or difficulty breathing,  lightheadedness or dizziness. Pt states that standing up from a regular chair when pt has to use his arms, pt feels like everything inside him feels like is drained, his legs quiver and has to sit back down. Pt sits down and does the pursed lip breathing, pt would recover. If pt however does not use his hands, he is fine. Pt also adds numb like feeling B legs.    Patient Stated Goals  Be able to walk normally and play basketball with grandsons like he used to before his surgery in October 2020.    Currently in Pain?  No/denies                               PT Education - 04/05/19 1001    Education Details  ther-ex, HEP    Person(s) Educated  Patient    Methods  Explanation;Demonstration;Verbal cues;Handout;Tactile cues    Comprehension  Returned demonstration;Verbalized understanding      Objective    MedbridgeAccess Code: [email or web address]   No pacemakers or defibrilators.   No latex allergies    Blood pressure measurements R ARM ONLY  Check blood pressure  Surgical incisions healed.    Pt states that he called his cardiac surgeon's office and was told that his only limitation is to not strain.    E-Stim1mn Chanel 1: R tibialis anterior at899m Channel 2: L tibialis anterior 7759mxercise during 10 seconds on, rest at 10 seconds off Performed to promote ankle DF to promoted foot clearance during LE swing phase of gait.  Ankle DF AAROM with strap to end range.   Pt states that the E-stim helps his ankles feel stronger  Therapeutic exercise   Blood pressure R arm sitting, mechanically taken, normal cuff: 125/55, HR96  SpO299% room air  Seated hip adduction folded pillow squeeze 10x5 seconds   Seated hip extension isometrics   R 10x5 seconds   L 10x5 seconds   Treadmill speed: 2.8 with B UE to one UE assist   5 minutes   Standing chops to the right, red band 10x2  No straining or pain felt  L  lumbar rotation (decreased R rotation posture) felt   Improved exercise technique, movement at target joints, use of target muscles after mod verbal, visual, tactile cues.      Response to treatment Good tolerance to therapy and activity. No complain of pain.   Clinical impression Pt improving overall endurance/activity tolerance based on clinical presentation. Pt  able to ambulate at the treadmill up to speed 2.8 for 5 minutes. Demonstrates B foot slap. Able to clear feet consistently and safely. Recommended AFO to help address foot slap. No feeling of being "drained" when standing up from sitting after performing LE strengthening exercise to promote use of musculovenous pump. Pt will benefit from continued skilled physical therapy services to improve strength, endurance, and function.    PT Short Term Goals - 03/31/19 0825      PT SHORT TERM GOAL #1   Title  Pt will be independent with his HEP to improve strength, balance, and function.    Baseline  Pt has started his HEP (02/16/2019); pt performing some of his HEP. No questions (03/31/2019)    Time  3    Period  Weeks    Status  Partially Met    Target Date  03/10/19        PT Long Term Goals - 03/31/19 0824      PT LONG TERM GOAL #1   Title  Pt will improve B LE strength by at least 1/2 MMT grade to promote ability to ambulate and perform functional tasks with less difficulty.    Time  8    Period  Weeks    Status  Achieved    Target Date  04/14/19      PT LONG TERM GOAL #2   Title  Patient will be able to ambulate without use of AD at least 500 ft to promote ability to return to PLOF.    Baseline  Currently ambulates longer distances with rollator (02/16/2019)    Time  8    Period  Weeks    Status  Achieved    Target Date  03/17/19      PT LONG TERM GOAL #3   Title  Pt will improve FOTO score by at least 10 points as a demonstration of improved function.    Baseline  LE FOTO 56 (02/16/2019); 59 (03/31/2019)     Time  8    Period  Weeks    Status  Partially Met    Target Date  04/14/19            Plan - 04/05/19 1008    Clinical Impression Statement  Pt improving overall endurance/activity tolerance based on clinical presentation. Pt able to ambulate at the treadmill up to speed 2.8 for 5 minutes. Demonstrates B foot slap. Able to clear feet consistently and safely. Recommended AFO to help address foot slap. No feeling of being "drained" when standing up from sitting after performing LE strengthening exercise to promote use of musculovenous pump. Pt will benefit from continued skilled physical therapy services to improve strength, endurance, and function.    Personal Factors and Comorbidities  Age;Comorbidity 3+    Comorbidities  S/P TEVER, B LE weakness, hx of aortic valve replacement, COPD, CVA    Examination-Activity Limitations  Transfers;Bed Mobility;Lift;Squat;Stairs;Carry;Locomotion Level    Stability/Clinical Decision Making  Stable/Uncomplicated    Rehab Potential  Fair    PT Frequency  2x / week    PT Duration  8 weeks    PT Treatment/Interventions  Therapeutic exercise;Balance training;Neuromuscular re-education;Patient/family education;Manual techniques;Dry needling;Aquatic Therapy;Advice worker;Therapeutic activities    PT Next Visit Plan  hip, knee, ankle, trunk strengthening, femoral control, gait training, manual techniques, modalities PRN    Consulted and Agree with Plan of Care  Patient       Patient will benefit from skilled therapeutic intervention in order to  improve the following deficits and impairments:  Postural dysfunction, Improper body mechanics, Difficulty walking, Decreased strength, Decreased endurance, Decreased balance, Abnormal gait  Visit Diagnosis: Muscle weakness (generalized)  Difficulty in walking, not elsewhere classified     Problem List Patient Active Problem List   Diagnosis Date Noted  . Bilateral finger numbness  01/20/2019  . Headache 01/20/2019  . Night sweats 01/20/2019  . UTI (urinary tract infection) 01/20/2019  . Swelling of male genital structure 01/20/2019  . Educated about COVID-19 virus infection 01/05/2019  . Cardiomyopathy ([place]) 01/05/2019  . COVID-19 11/10/2018  . Fall 05/07/2018  . Abdominal pain 01/04/2018  . Costochondritis 01/04/2018  . Thyroid nodule 01/04/2018  . Dissecting aneurysm of thoracic aorta, Stanford type B ([place]) 11/08/2017  . Anemia 11/08/2017  . Hyponatremia 11/08/2017  . Overweight 07/01/2017  . TIA (transient ischemic attack)   . Stroke ([place])   . GERD (gastroesophageal reflux disease)   . Arthritis   . Neck pain 12/01/2016  . Knee osteoarthritis 12/01/2016  . Physical deconditioning 06/24/2016  . Dyspnea on exertion 06/23/2016  . Chronic pain of right knee 12/18/2015  . Chronic obstructive pulmonary disease ([place]) 11/14/2014  . BPH (benign prostatic hyperplasia) 11/14/2014  . Preventative health care 11/14/2014  . Long term current use of anticoagulant therapy 10/29/2013  . Cerebral infarction ([place]) 06/02/2013  . H/O aortic valve replacement 12/26/2011  . History of aortic arch replacement 01/07/2011  . HTN (hypertension)   . Hyperlipidemia   . Aortic valve disease     Joneen Boers PT, DPT   04/05/2019, 5:32 PM  [facility] [address] [place], [address] Phone: [phone]   Fax:  [phone]  Name: Alexander Barton MRN: [id number] Date of Birth: 1950/12/12

## 2019-04-07 ENCOUNTER — Ambulatory Visit: Payer: Medicare PPO

## 2019-04-07 ENCOUNTER — Other Ambulatory Visit: Payer: Self-pay

## 2019-04-07 DIAGNOSIS — R262 Difficulty in walking, not elsewhere classified: Secondary | ICD-10-CM | POA: Diagnosis not present

## 2019-04-07 DIAGNOSIS — M6281 Muscle weakness (generalized): Secondary | ICD-10-CM | POA: Diagnosis not present

## 2019-04-07 NOTE — Therapy (Signed)
[facility] [address], [place], [address] Phone: [phone]   Fax:  [phone]  Physical Therapy Treatment  Patient Details  Name: Alexander Barton MRN: [id number] Date of Birth: 10/20/1950 Referring Provider (PT): [name], MD    Encounter Date: 04/07/2019  PT End of Session - 04/07/19 0934    Visit Number  12    Number of Visits  17    Date for PT Re-Evaluation  04/14/19    Authorization Type  2    Authorization Time Period  of 10 progress report    Authorization - Visit Number  12    Authorization - Number of Visits  16   to 04/19/2019   PT Start Time  0934    PT Stop Time  1029    PT Time Calculation (min)  55 min    Activity Tolerance  Patient tolerated treatment well;Treatment limited secondary to medical complications (Comment)    Behavior During Therapy  [facility] for tasks assessed/performed       Past Medical History:  Diagnosis Date  . Aortic aneurysm, thoracic ([place])   . Aortic valve disease   . Arthritis   . BPH (benign prostatic hyperplasia)   . COPD (chronic obstructive pulmonary disease) ([place])   . GERD (gastroesophageal reflux disease)   . HTN (hypertension)   . Hypercholesterolemia   . Stroke ([place])   . TIA (transient ischemic attack)     Past Surgical History:  Procedure Laterality Date  . ACHILLES TENDON REPAIR    . AORTIC VALVE REPLACEMENT     #23 On-X valve conduit  . ASCENDING AORTIC ANEURYSM REPAIR    . BACK SURGERY    . FOOT SURGERY      There were no vitals filed for this visit.  Subjective Assessment - 04/07/19 0940    Subjective  Walking is better. Not as dizzy standing up from sitting. Getting stronger.    Pertinent History  B LE weakness. Pt states that he does not usually use his rollator but does not know when he will feel out of breath.  Pt has a hx aortic valve replacement 2008. Also had another Sternotomy 12/01/2018. 12/09/2018 pt underwent TEVAR  with a L subclavion artery and coratid artery graft.  Pt was able to ambulate with a walker 2 weeks after the surgeries. Pt also had a UTI after both surgeries. Pt was admitted. Pt was not allowed to walk for 1 week. Pt was also told that pt was going to have neurological issues with his legs and feet but 2 months of PT should take care of that. Pt went home and pt had a crazy walk and felt like he was a "prancing horse. " Pt feels like he is raising his feet high without him being able to control. Had PT at [facility] after his surgery and did well but still had difficulty controlling how high he can lift his legs. Had the difficulty with controlling how high he can lift his legs after his UTI. His legs usually comes high in the middle of the night going to the bathroom (after being in bed for a few hours), as well after sitting for a while (a couple of hours) at times, kind of unpredictable. Pt sits on a chair lift. Pt also has a difficult time with swelling in his feet and ankles. Take lasix to help. Pt sits for a while keeping his feet elevated due to the  B LE swelling. B LE swelling may have started after his UTI. Pt also has a hx of 3 CVA (first 2 were TIAs 2012 and 2014), The third occurence was actually a full infarct/CVA in 2015 which decreased his endurance. Did not notice any weakness. Pt currently unable to play golf which is his leisure activity. Pt could not lift more than 5 lbs after his thoracic surgery in 11/2018. Has not seen Dr. Mali Hughes yet so does not know if his restrictions have been lifted yet. Pt furniture walks. [place] one time around December 15/16, 2020. Pt tried to break his fall and landed on his R side. Does not know if he moved anything in his chest surgery. Did not cal lhis doctor. Denies chest pain or difficulty breathing, lightheadedness or dizziness. Pt states that standing up from a regular chair when pt has to use his arms, pt feels like everything inside him feels like is  drained, his legs quiver and has to sit back down. Pt sits down and does the pursed lip breathing, pt would recover. If pt however does not use his hands, he is fine. Pt also adds numb like feeling B legs.    Patient Stated Goals  Be able to walk normally and play basketball with grandsons like he used to before his surgery in October 2020.    Currently in Pain?  No/denies                               PT Education - 04/07/19 0946    Education Details  ther-ex    Person(s) Educated  Patient    Methods  Explanation;Demonstration;Tactile cues;Verbal cues    Comprehension  Returned demonstration;Verbalized understanding      Objective    MedbridgeAccess Code: [email or web address]   No pacemakers or defibrilators.   No latex allergies    Blood pressure measurements R ARM ONLY  Check blood pressure  Surgical incisions healed.    Pt states that he called his cardiac surgeon's office and was told that his only limitation is to not strain.    E-Stim64mn Chanel 1: R tibialis anterior at969m Channel 2: L tibialis anterior 8141mxercise during 10 seconds on, rest at 10 seconds off Performed to promote ankle DF to promoted foot clearance during LE swing phase of gait.            Ankle DF AAROM with strap to end range.   Pt states that the E-stim helps his ankles feel stronger   Manual therapy Seated STM R lateral hamstrings, IT band, Vastus lateralis mucle  Therapeutic exercise   Blood pressure R arm sitting, mechanically taken, normal cuff: 123/55, HR102  SpO298% room air   sit <> stand without UE assist, cues for proper R foot position to decrease R knee pain 5x3  No R knee pain after MT  SLS with one to no UE assist   R 5x5 seconds   L 5x5 seconds    Improved exercise technique, movement at target joints, use of target muscles after mod verbal, visual, tactile cues.      Response to  treatment Good tolerance to therapyand activity.   Clinical impression No R knee pain after treatment to decrease R lateral hamstrings, vastus lateralis, and IT band. Improving LE strength and balance observed with pt not needing UE assist for sit <> stand and SLS at times.  Pt tolerated session well without aggravation of symptoms.  Pt will benefit from continued skilled physical therapy services to improve strength and function.      PT Short Term Goals - 03/31/19 0825      PT SHORT TERM GOAL #1   Title  Pt will be independent with his HEP to improve strength, balance, and function.    Baseline  Pt has started his HEP (02/16/2019); pt performing some of his HEP. No questions (03/31/2019)    Time  3    Period  Weeks    Status  Partially Met    Target Date  03/10/19        PT Long Term Goals - 03/31/19 0824      PT LONG TERM GOAL #1   Title  Pt will improve B LE strength by at least 1/2 MMT grade to promote ability to ambulate and perform functional tasks with less difficulty.    Time  8    Period  Weeks    Status  Achieved    Target Date  04/14/19      PT LONG TERM GOAL #2   Title  Patient will be able to ambulate without use of AD at least 500 ft to promote ability to return to PLOF.    Baseline  Currently ambulates longer distances with rollator (02/16/2019)    Time  8    Period  Weeks    Status  Achieved    Target Date  03/17/19      PT LONG TERM GOAL #3   Title  Pt will improve FOTO score by at least 10 points as a demonstration of improved function.    Baseline  LE FOTO 56 (02/16/2019); 59 (03/31/2019)    Time  8    Period  Weeks    Status  Partially Met    Target Date  04/14/19            Plan - 04/07/19 0933    Clinical Impression Statement  No R knee pain after treatment to decrease R lateral hamstrings, vastus lateralis, and IT band. Improving LE strength and balance observed with pt not needing UE assist for sit <> stand and SLS at times.  Pt tolerated  session well without aggravation of symptoms. Pt will benefit from continued skilled physical therapy services to improve strength and function.    Personal Factors and Comorbidities  Age;Comorbidity 3+    Comorbidities  S/P TEVER, B LE weakness, hx of aortic valve replacement, COPD, CVA    Examination-Activity Limitations  Transfers;Bed Mobility;Lift;Squat;Stairs;Carry;Locomotion Level    Stability/Clinical Decision Making  Stable/Uncomplicated    Rehab Potential  Fair    PT Frequency  2x / week    PT Duration  8 weeks    PT Treatment/Interventions  Therapeutic exercise;Balance training;Neuromuscular re-education;Patient/family education;Manual techniques;Dry needling;Aquatic Therapy;Advice worker;Therapeutic activities    PT Next Visit Plan  hip, knee, ankle, trunk strengthening, femoral control, gait training, manual techniques, modalities PRN    Consulted and Agree with Plan of Care  Patient       Patient will benefit from skilled therapeutic intervention in order to improve the following deficits and impairments:  Postural dysfunction, Improper body mechanics, Difficulty walking, Decreased strength, Decreased endurance, Decreased balance, Abnormal gait  Visit Diagnosis: Muscle weakness (generalized)  Difficulty in walking, not elsewhere classified     Problem List Patient Active Problem List   Diagnosis Date Noted  . Bilateral finger numbness 01/20/2019  . Headache 01/20/2019  . Night sweats 01/20/2019  . UTI (urinary  tract infection) 01/20/2019  . Swelling of male genital structure 01/20/2019  . Educated about COVID-19 virus infection 01/05/2019  . Cardiomyopathy ([place]) 01/05/2019  . COVID-19 11/10/2018  . Fall 05/07/2018  . Abdominal pain 01/04/2018  . Costochondritis 01/04/2018  . Thyroid nodule 01/04/2018  . Dissecting aneurysm of thoracic aorta, Stanford type B ([place]) 11/08/2017  . Anemia 11/08/2017  . Hyponatremia 11/08/2017  . Overweight  07/01/2017  . TIA (transient ischemic attack)   . Stroke ([place])   . GERD (gastroesophageal reflux disease)   . Arthritis   . Neck pain 12/01/2016  . Knee osteoarthritis 12/01/2016  . Physical deconditioning 06/24/2016  . Dyspnea on exertion 06/23/2016  . Chronic pain of right knee 12/18/2015  . Chronic obstructive pulmonary disease ([place]) 11/14/2014  . BPH (benign prostatic hyperplasia) 11/14/2014  . Preventative health care 11/14/2014  . Long term current use of anticoagulant therapy 10/29/2013  . Cerebral infarction ([place]) 06/02/2013  . H/O aortic valve replacement 12/26/2011  . History of aortic arch replacement 01/07/2011  . HTN (hypertension)   . Hyperlipidemia   . Aortic valve disease     Joneen Boers PT, DPT   04/07/2019, 12:55 PM  [facility] [address] [place], [address] Phone: [phone]   Fax:  [phone]  Name: MARRIO SCRIBNER MRN: [id number] Date of Birth: 10/02/1950

## 2019-04-12 ENCOUNTER — Ambulatory Visit: Payer: Medicare PPO

## 2019-04-12 ENCOUNTER — Other Ambulatory Visit: Payer: Self-pay

## 2019-04-12 DIAGNOSIS — M6281 Muscle weakness (generalized): Secondary | ICD-10-CM | POA: Diagnosis not present

## 2019-04-12 DIAGNOSIS — R262 Difficulty in walking, not elsewhere classified: Secondary | ICD-10-CM

## 2019-04-12 NOTE — Therapy (Signed)
[facility] [address] [place], [address] Phone: [phone]   Fax:  [phone]  Physical Therapy Treatment  Patient Details  Name: Alexander Barton MRN: [id number] Date of Birth: November 23, 1950 Referring Provider (PT): [name], MD    Encounter Date: 04/12/2019  PT End of Session - 04/12/19 0804    Visit Number  13    Number of Visits  17    Date for PT Re-Evaluation  04/14/19    Authorization Type  3    Authorization Time Period  of 10 progress report    Authorization - Visit Number  13    Authorization - Number of Visits  16   to 04/19/2019   PT Start Time  0804    PT Stop Time  3662    PT Time Calculation (min)  50 min    Activity Tolerance  Patient tolerated treatment well;Treatment limited secondary to medical complications (Comment)    Behavior During Therapy  [facility] for tasks assessed/performed       Past Medical History:  Diagnosis Date  . Aortic aneurysm, thoracic ([place])   . Aortic valve disease   . Arthritis   . BPH (benign prostatic hyperplasia)   . COPD (chronic obstructive pulmonary disease) ([place])   . GERD (gastroesophageal reflux disease)   . HTN (hypertension)   . Hypercholesterolemia   . Stroke ([place])   . TIA (transient ischemic attack)     Past Surgical History:  Procedure Laterality Date  . ACHILLES TENDON REPAIR    . AORTIC VALVE REPLACEMENT     #23 On-X valve conduit  . ASCENDING AORTIC ANEURYSM REPAIR    . BACK SURGERY    . FOOT SURGERY      There were no vitals filed for this visit.  Subjective Assessment - 04/12/19 0807    Subjective  Getting up and out of the car and starting to walk too quickly would get him off balance. Has to stand first. Overall strength and balance is some better. Walked about a good mile and a quarter without a cane or AD this weekend.  Wants to try the elliptical.    Pertinent History  B LE weakness. Pt states that he does not usually  use his rollator but does not know when he will feel out of breath.  Pt has a hx aortic valve replacement 2008. Also had another Sternotomy 12/01/2018. 12/09/2018 pt underwent TEVAR with a L subclavion artery and coratid artery graft.  Pt was able to ambulate with a walker 2 weeks after the surgeries. Pt also had a UTI after both surgeries. Pt was admitted. Pt was not allowed to walk for 1 week. Pt was also told that pt was going to have neurological issues with his legs and feet but 2 months of PT should take care of that. Pt went home and pt had a crazy walk and felt like he was a "prancing horse. " Pt feels like he is raising his feet high without him being able to control. Had PT at [facility] after his surgery and did well but still had difficulty controlling how high he can lift his legs. Had the difficulty with controlling how high he can lift his legs after his UTI. His legs usually comes high in the middle of the night going to the bathroom (after being in bed for a few hours), as well after sitting for a while (a couple of hours) at times,  kind of unpredictable. Pt sits on a chair lift. Pt also has a difficult time with swelling in his feet and ankles. Take lasix to help. Pt sits for a while keeping his feet elevated due to the B LE swelling. B LE swelling may have started after his UTI. Pt also has a hx of 3 CVA (first 2 were TIAs 2012 and 2014), The third occurence was actually a full infarct/CVA in 2015 which decreased his endurance. Did not notice any weakness. Pt currently unable to play golf which is his leisure activity. Pt could not lift more than 5 lbs after his thoracic surgery in 11/2018. Has not seen Dr. Mali Hughes yet so does not know if his restrictions have been lifted yet. Pt furniture walks. [place] one time around December 15/16, 2020. Pt tried to break his fall and landed on his R side. Does not know if he moved anything in his chest surgery. Did not cal lhis doctor. Denies chest pain or  difficulty breathing, lightheadedness or dizziness. Pt states that standing up from a regular chair when pt has to use his arms, pt feels like everything inside him feels like is drained, his legs quiver and has to sit back down. Pt sits down and does the pursed lip breathing, pt would recover. If pt however does not use his hands, he is fine. Pt also adds numb like feeling B legs.    Patient Stated Goals  Be able to walk normally and play basketball with grandsons like he used to before his surgery in October 2020.    Currently in Pain?  No/denies                               PT Education - 04/12/19 0823    Education Details  ther-ex    Person(s) Educated  Patient    Methods  Explanation;Demonstration;Tactile cues;Verbal cues    Comprehension  Returned demonstration;Verbalized understanding      Objective    MedbridgeAccess Code: [email or web address]   No pacemakers or defibrilators.   No latex allergies    Blood pressure measurements R ARM ONLY  Check blood pressure  Surgical incisions healed.    Pt states that he called his cardiac surgeon's office and was told that his only limitation is to not strain.    E-Stim2mn Chanel 1: R tibialis anterior at825m Channel 2: L tibialis anterior 8089mxercise during 10 seconds on, rest at 10 seconds off Performed to promote ankle DF to promoted foot clearance during LE swing phase of gait. Ankle DF AAROM with strap to end range.  Therapeutic exercise   Blood pressure R arm sitting, mechanically taken, normal cuff: 126/61, HR93hj  SpO298% room air   sit <> stand without UE assist, cues for proper R foot position to decrease R knee pain 5x              Seated R knee flexion targeting medial hamstrings, red band 10x3  Good medial hamstrings muscle use felt  R lateral leg numbness increased which eases to baseline with rest.   Decreased R knee pain with  sit <> stand from regular chair afterwards.   Sit <> stand from regular chair 10x no UE assist. Decreased knee pain after medial hamstrings exercise  SLS with one to no UE assist              R 10x5 seconds  L 10x5 seconds  Standing low rows red band 10x5 seconds to promote abdominal muscle strengthening.    FOTO and Nustep next visit if appropriate      Improved exercise technique, movement at target joints, use of target muscles after mod verbal, visual, tactile cues.    Response to treatment Pt tolerated session well without aggravation of symptoms. No feelings of being drained with sit <> stand.   Clinical impression Continued working on improving B LE strength to promote musculovenous pump use to maintain blood flow as well as to promote ability to perform standing tasks with less difficulty. Decreased R knee pain with increased medial hamstring use to promote better mechanics at his R knee joint. Pt tolerated session well without aggravation of symptoms. Unable to perform Nustep today (pt request) secondary to time restraints. Will try next visit if appropriate. Pt will benefit from continued skilled physical therapy services to improve strength, endurance, and function.     PT Short Term Goals - 03/31/19 0825      PT SHORT TERM GOAL #1   Title  Pt will be independent with his HEP to improve strength, balance, and function.    Baseline  Pt has started his HEP (02/16/2019); pt performing some of his HEP. No questions (03/31/2019)    Time  3    Period  Weeks    Status  Partially Met    Target Date  03/10/19        PT Long Term Goals - 03/31/19 0824      PT LONG TERM GOAL #1   Title  Pt will improve B LE strength by at least 1/2 MMT grade to promote ability to ambulate and perform functional tasks with less difficulty.    Time  8    Period  Weeks    Status  Achieved    Target Date  04/14/19      PT LONG TERM GOAL #2   Title  Patient will be  able to ambulate without use of AD at least 500 ft to promote ability to return to PLOF.    Baseline  Currently ambulates longer distances with rollator (02/16/2019)    Time  8    Period  Weeks    Status  Achieved    Target Date  03/17/19      PT LONG TERM GOAL #3   Title  Pt will improve FOTO score by at least 10 points as a demonstration of improved function.    Baseline  LE FOTO 56 (02/16/2019); 59 (03/31/2019)    Time  8    Period  Weeks    Status  Partially Met    Target Date  04/14/19            Plan - 04/12/19 0825    Clinical Impression Statement  Continued working on improving B LE strength to promote musculovenous pump use to maintain blood flow as well as to promote ability to perform standing tasks with less difficulty. Decreased R knee pain with increased medial hamstring use to promote better mechanics at his R knee joint. Pt tolerated session well without aggravation of symptoms. Unable to perform Nustep today (pt request) secondary to time restraints. Will try next visit if appropriate. Pt will benefit from continued skilled physical therapy services to improve strength, endurance, and function.    Personal Factors and Comorbidities  Age;Comorbidity 3+    Comorbidities  S/P TEVER, B LE weakness, hx of aortic valve replacement, COPD, CVA  Examination-Activity Limitations  Transfers;Bed Mobility;Lift;Squat;Stairs;Carry;Locomotion Level    Stability/Clinical Decision Making  Stable/Uncomplicated    Rehab Potential  Fair    PT Frequency  2x / week    PT Duration  8 weeks    PT Treatment/Interventions  Therapeutic exercise;Balance training;Neuromuscular re-education;Patient/family education;Manual techniques;Dry needling;Aquatic Therapy;Advice worker;Therapeutic activities    PT Next Visit Plan  hip, knee, ankle, trunk strengthening, femoral control, gait training, manual techniques, modalities PRN    Consulted and Agree with Plan of Care  Patient        Patient will benefit from skilled therapeutic intervention in order to improve the following deficits and impairments:  Postural dysfunction, Improper body mechanics, Difficulty walking, Decreased strength, Decreased endurance, Decreased balance, Abnormal gait  Visit Diagnosis: Muscle weakness (generalized)  Difficulty in walking, not elsewhere classified     Problem List Patient Active Problem List   Diagnosis Date Noted  . Bilateral finger numbness 01/20/2019  . Headache 01/20/2019  . Night sweats 01/20/2019  . UTI (urinary tract infection) 01/20/2019  . Swelling of male genital structure 01/20/2019  . Educated about COVID-19 virus infection 01/05/2019  . Cardiomyopathy ([place]) 01/05/2019  . COVID-19 11/10/2018  . Fall 05/07/2018  . Abdominal pain 01/04/2018  . Costochondritis 01/04/2018  . Thyroid nodule 01/04/2018  . Dissecting aneurysm of thoracic aorta, Stanford type B ([place]) 11/08/2017  . Anemia 11/08/2017  . Hyponatremia 11/08/2017  . Overweight 07/01/2017  . TIA (transient ischemic attack)   . Stroke ([place])   . GERD (gastroesophageal reflux disease)   . Arthritis   . Neck pain 12/01/2016  . Knee osteoarthritis 12/01/2016  . Physical deconditioning 06/24/2016  . Dyspnea on exertion 06/23/2016  . Chronic pain of right knee 12/18/2015  . Chronic obstructive pulmonary disease ([place]) 11/14/2014  . BPH (benign prostatic hyperplasia) 11/14/2014  . Preventative health care 11/14/2014  . Long term current use of anticoagulant therapy 10/29/2013  . Cerebral infarction ([place]) 06/02/2013  . H/O aortic valve replacement 12/26/2011  . History of aortic arch replacement 01/07/2011  . HTN (hypertension)   . Hyperlipidemia   . Aortic valve disease     Joneen Boers PT, DPT   04/12/2019, 9:14 AM  [facility] [address], [place], [address] Phone: [phone]   Fax:  [phone]  Name: Alexander Barton MRN: [id number] Date of Birth: October 23, 1950

## 2019-04-13 ENCOUNTER — Ambulatory Visit: Payer: Medicare PPO

## 2019-04-13 ENCOUNTER — Other Ambulatory Visit: Payer: Self-pay

## 2019-04-13 DIAGNOSIS — M6281 Muscle weakness (generalized): Secondary | ICD-10-CM | POA: Diagnosis not present

## 2019-04-13 DIAGNOSIS — R262 Difficulty in walking, not elsewhere classified: Secondary | ICD-10-CM | POA: Diagnosis not present

## 2019-04-13 NOTE — Therapy (Signed)
[place] [facility] [address] [place], [address] Phone: [phone]   Fax:  [phone]   Physical Therapy Treatment  Patient Details  Name: Alexander Barton MRN: [id number] Date of Birth: [birth year] Referring Provider (PT): [name], MD    Encounter Date: 04/13/2019   PT End of Session - 04/13/19 0802    Visit Number  14    Number of Visits  25    Date for PT Re-Evaluation  05/12/19    Authorization Type  4    Authorization Time Period  of 10 progress report    Authorization - Visit Number  14    Authorization - Number of Visits  16   to 04/19/2019   PT Start Time  0803    PT Stop Time  0855    PT Time Calculation (min)  52 min    Activity Tolerance  Patient tolerated treatment well;Treatment limited secondary to medical complications (Comment)    Behavior During Therapy  [facility] for tasks assessed/performed       Past Medical History:  Diagnosis Date  . Aortic aneurysm, thoracic ([place])   . Aortic valve disease   . Arthritis   . BPH (benign prostatic hyperplasia)   . COPD (chronic obstructive pulmonary disease) ([place])   . GERD (gastroesophageal reflux disease)   . HTN (hypertension)   . Hypercholesterolemia   . Stroke ([place])   . TIA (transient ischemic attack)     Past Surgical History:  Procedure Laterality Date  . ACHILLES TENDON REPAIR    . AORTIC VALVE REPLACEMENT     #23 On-X valve conduit  . ASCENDING AORTIC ANEURYSM REPAIR    . BACK SURGERY    . FOOT SURGERY      There were no vitals filed for this visit.  Subjective Assessment - 04/13/19 0804    Subjective  No pain, no problems.  Took his blood pressure this morning and it was real good. Has not had the feeling of lightheadedness for a week now. Not having shortness of breath. Feels like he is making progress. Wants to continue PT because he still feels like he needs it.    Pertinent History  B LE weakness. Pt states that he does  not usually use his rollator but does not know when he will feel out of breath.  Pt has a hx aortic valve replacement 2008. Also had another Sternotomy 12/01/2018. 12/09/2018 pt underwent TEVAR with a L subclavion artery and coratid artery graft.  Pt was able to ambulate with a walker 2 weeks after the surgeries. Pt also had a UTI after both surgeries. Pt was admitted. Pt was not allowed to walk for 1 week. Pt was also told that pt was going to have neurological issues with his legs and feet but 2 months of PT should take care of that. Pt went home and pt had a crazy walk and felt like he was a "prancing horse. " Pt feels like he is raising his feet high without him being able to control. Had PT at [facility] after his surgery and did well but still had difficulty controlling how high he can lift his legs. Had the difficulty with controlling how high he can lift his legs after his UTI. His legs usually comes high in the middle of the night going to the bathroom (after being in bed for a few hours), as well after sitting for a while (a couple of hours)  at times, kind of unpredictable. Pt sits on a chair lift. Pt also has a difficult time with swelling in his feet and ankles. Take lasix to help. Pt sits for a while keeping his feet elevated due to the B LE swelling. B LE swelling may have started after his UTI. Pt also has a hx of 3 CVA (first 2 were TIAs 2012 and 2014), The third occurence was actually a full infarct/CVA in 2015 which decreased his endurance. Did not notice any weakness. Pt currently unable to play golf which is his leisure activity. Pt could not lift more than 5 lbs after his thoracic surgery in 11/2018. Has not seen Dr. Mali Hughes yet so does not know if his restrictions have been lifted yet. Pt furniture walks. [place] one time around December 15/16, 2020. Pt tried to break his fall and landed on his R side. Does not know if he moved anything in his chest surgery. Did not cal lhis doctor. Denies  chest pain or difficulty breathing, lightheadedness or dizziness. Pt states that standing up from a regular chair when pt has to use his arms, pt feels like everything inside him feels like is drained, his legs quiver and has to sit back down. Pt sits down and does the pursed lip breathing, pt would recover. If pt however does not use his hands, he is fine. Pt also adds numb like feeling B legs.    Patient Stated Goals  Be able to walk normally and play basketball with grandsons like he used to before his surgery in October 2020.    Currently in Pain?  No/denies         [facility] PT Assessment - 04/13/19 1043      Observation/Other Assessments   Focus on Therapeutic Outcomes (FOTO)   LE FOTO 61                           PT Education - 04/13/19 0807    Education Details  ther-ex    Person(s) Educated  Patient    Methods  Explanation;Demonstration;Tactile cues;Verbal cues    Comprehension  Returned demonstration;Verbalized understanding        Objective    MedbridgeAccess Code: [email or web address]   No pacemakers or defibrilators.   No latex allergies    Blood pressure measurements R ARM ONLY  Check blood pressure  Surgical incisions healed.    Pt states that he called his cardiac surgeon's office and was told that his only limitation is to not strain.    E-Stim24mn Chanel 1: R tibialis anterior at794m Channel 2: L tibialis anterior77 mA Exercise during 10 seconds on, rest at 10 seconds off Performed to promote ankle DF to promoted foot clearance during LE swing phase of gait. Ankle DF AAROM with strap to end range.  Therapeutic exercise    Nustep seat 11, arms 11, level 7 for 3 minutes, then level 1 for 3 minutes. Cues for pacing  Running man with contralateral foot on furniture slider and contralateral UE support  R 4x R knee joint discomfort, eases with rest  L 5x   Seated R knee flexion  targeting medial hamstrings, red band 10x3 to promote proper mechanics at R knee             Good medial hamstrings muscle use felt  Seated hip adduction pillow squeeze 10x5 seconds with glute max squeeze    Reviewed POC: 2x for 4 weeks  Improved exercise technique, movement at target joints, use of target muscles after mod verbal, visual, tactile cues.    Response to treatment Pt tolerated session well without aggravation of symptoms. No feelings of being drained with sit <> stand.   Clinical impression  Pt demonstrates overall improved B LE strength, endurance, activity tolerance (able to ambulate at least 1.25 miles this past weekend per pt subjective reports), function, and currently able to ambulate independently without LOB. Pt making very good progress with PT towards goals and demonstrates potential for further improvement. Pt will benefit from continued skilled physical therapy services to continue to improve LE strength, endurance, and function.        PT Short Term Goals - 04/13/19 1047      PT SHORT TERM GOAL #1   Title  Pt will be independent with his HEP to improve strength, balance, and function.    Baseline  Pt has started his HEP (02/16/2019); pt performing some of his HEP. No questions (03/31/2019), (04/13/2019)    Time  3    Period  Weeks    Status  Achieved    Target Date  03/10/19        PT Long Term Goals - 04/13/19 1048      PT LONG TERM GOAL #1   Title  Pt will improve B LE strength by at least 1/2 MMT grade to promote ability to ambulate and perform functional tasks with less difficulty.    Time  8    Period  Weeks    Status  Achieved    Target Date  04/14/19      PT LONG TERM GOAL #2   Title  Patient will be able to ambulate without use of AD at least 500 ft to promote ability to return to PLOF.    Baseline  Currently ambulates longer distances with rollator (02/16/2019); able to ambulate > 500 ft independently. Pt reports being able  to ambulate up to 1.25 miles without AD (04/13/2019)    Time  8    Period  Weeks    Status  Achieved    Target Date  04/14/19      PT LONG TERM GOAL #3   Title  Pt will improve FOTO score by at least 10 points as a demonstration of improved function.    Baseline  LE FOTO 56 (02/16/2019); 59 (03/31/2019); 61 (04/13/2019)    Time  4    Period  Weeks    Status  Partially Met    Target Date  05/12/19      PT LONG TERM GOAL #4   Title  Pt will have 5/5 B hip strength to promote ability to ambulate and perform functional tasks with less difficulty.    Time  4    Period  Weeks    Status  New    Target Date  05/12/19      PT LONG TERM GOAL #5   Title  Pt will be able to jog at least 3 min on even surface safely to promote ability to perform exercises to promote physical health.    Baseline  Currently unable to jog. (04/13/2019)    Time  4    Period  Weeks    Status  New    Target Date  05/12/19            Plan - 04/13/19 0807    Clinical Impression Statement  Pt demonstrates overall improved B LE strength, endurance, activity tolerance (  able to ambulate at least 1.25 miles this past weekend per pt subjective reports), function, and currently able to ambulate independently without LOB. Pt making very good progress with PT towards goals and demonstrates potential for further improvement. Pt will benefit from continued skilled physical therapy services to continue to improve LE strength, endurance, and function.    Personal Factors and Comorbidities  Age;Comorbidity 3+    Comorbidities  S/P TEVER, B LE weakness, hx of aortic valve replacement, COPD, CVA    Examination-Activity Limitations  Transfers;Bed Mobility;Lift;Squat;Stairs;Carry;Locomotion Level    Stability/Clinical Decision Making  Stable/Uncomplicated    Clinical Decision Making  Low    Rehab Potential  Good    PT Frequency  2x / week    PT Duration  4 weeks    PT Treatment/Interventions  Therapeutic exercise;Balance  training;Neuromuscular re-education;Patient/family education;Manual techniques;Dry needling;Aquatic Therapy;Advice worker;Therapeutic activities    PT Next Visit Plan  hip, knee, ankle, trunk strengthening, femoral control, gait training, manual techniques, modalities PRN    Consulted and Agree with Plan of Care  Patient       Patient will benefit from skilled therapeutic intervention in order to improve the following deficits and impairments:  Postural dysfunction, Improper body mechanics, Difficulty walking, Decreased strength, Decreased endurance, Decreased balance, Abnormal gait  Visit Diagnosis: Muscle weakness (generalized) - Plan: PT plan of care cert/re-cert  Difficulty in walking, not elsewhere classified - Plan: PT plan of care cert/re-cert     Problem List Patient Active Problem List   Diagnosis Date Noted  . Bilateral finger numbness 01/20/2019  . Headache 01/20/2019  . Night sweats 01/20/2019  . UTI (urinary tract infection) 01/20/2019  . Swelling of male genital structure 01/20/2019  . Educated about COVID-19 virus infection 01/05/2019  . Cardiomyopathy ([place]) 01/05/2019  . COVID-19 11/10/2018  . Fall 05/07/2018  . Abdominal pain 01/04/2018  . Costochondritis 01/04/2018  . Thyroid nodule 01/04/2018  . Dissecting aneurysm of thoracic aorta, Stanford type B ([place]) 11/08/2017  . Anemia 11/08/2017  . Hyponatremia 11/08/2017  . Overweight 07/01/2017  . TIA (transient ischemic attack)   . Stroke ([place])   . GERD (gastroesophageal reflux disease)   . Arthritis   . Neck pain 12/01/2016  . Knee osteoarthritis 12/01/2016  . Physical deconditioning 06/24/2016  . Dyspnea on exertion 06/23/2016  . Chronic pain of right knee 12/18/2015  . Chronic obstructive pulmonary disease ([place]) 11/14/2014  . BPH (benign prostatic hyperplasia) 11/14/2014  . Preventative health care 11/14/2014  . Long term current use of anticoagulant therapy 10/29/2013  . Cerebral  infarction ([place]) 06/02/2013  . H/O aortic valve replacement 12/26/2011  . History of aortic arch replacement 01/07/2011  . HTN (hypertension)   . Hyperlipidemia   . Aortic valve disease     Joneen Boers PT, DPT   04/13/2019, 10:59 AM  [place] [facility] [address] [place], [address] Phone: [phone]   Fax:  [phone]  Name: Alexander Barton MRN: [id number] Date of Birth: 1950/09/10

## 2019-04-18 ENCOUNTER — Other Ambulatory Visit: Payer: Self-pay

## 2019-04-18 ENCOUNTER — Ambulatory Visit: Payer: Medicare PPO

## 2019-04-18 ENCOUNTER — Ambulatory Visit (INDEPENDENT_AMBULATORY_CARE_PROVIDER_SITE_OTHER): Payer: Medicare PPO | Admitting: Pharmacist

## 2019-04-18 DIAGNOSIS — R262 Difficulty in walking, not elsewhere classified: Secondary | ICD-10-CM

## 2019-04-18 DIAGNOSIS — M6281 Muscle weakness (generalized): Secondary | ICD-10-CM

## 2019-04-18 DIAGNOSIS — Z7901 Long term (current) use of anticoagulants: Secondary | ICD-10-CM | POA: Diagnosis not present

## 2019-04-18 DIAGNOSIS — I359 Nonrheumatic aortic valve disorder, unspecified: Secondary | ICD-10-CM

## 2019-04-18 LAB — POCT INR: INR: 1.9 — AB (ref 2.0–3.0)

## 2019-04-18 NOTE — Therapy (Signed)
[place] [facility] [address] [place], [address] Phone: [phone]   Fax:  [phone]  Physical Therapy Treatment  Patient Details  Name: Alexander Barton MRN: [id number] Date of Birth: 08/23/1950 Referring Provider (PT): [name], MD    Encounter Date: 04/18/2019  PT End of Session - 04/18/19 1034    Visit Number  15    Number of Visits  25    Date for PT Re-Evaluation  05/12/19    Authorization Type  5    Authorization Time Period  of 10 progress report    Authorization - Visit Number  15    Authorization - Number of Visits  16   to 04/19/2019   PT Start Time  1034    PT Stop Time  1116    PT Time Calculation (min)  42 min    Activity Tolerance  Patient tolerated treatment well;Treatment limited secondary to medical complications (Comment)    Behavior During Therapy  [facility] for tasks assessed/performed       Past Medical History:  Diagnosis Date  . Aortic aneurysm, thoracic ([place])   . Aortic valve disease   . Arthritis   . BPH (benign prostatic hyperplasia)   . COPD (chronic obstructive pulmonary disease) ([place])   . GERD (gastroesophageal reflux disease)   . HTN (hypertension)   . Hypercholesterolemia   . Stroke ([place])   . TIA (transient ischemic attack)     Past Surgical History:  Procedure Laterality Date  . ACHILLES TENDON REPAIR    . AORTIC VALVE REPLACEMENT     #23 On-X valve conduit  . ASCENDING AORTIC ANEURYSM REPAIR    . BACK SURGERY    . FOOT SURGERY      There were no vitals filed for this visit.  Subjective Assessment - 04/18/19 1046    Subjective  Strength is doing ok. R knee is ok. Feeling more burning sensations B lateral legs. Usually occurs when sitting or resting. Does not happen when walking or exercises.    Pertinent History  B LE weakness. Pt states that he does not usually use his rollator but does not know when he will feel out of breath.  Pt has a hx aortic  valve replacement 2008. Also had another Sternotomy 12/01/2018. 12/09/2018 pt underwent TEVAR with a L subclavion artery and coratid artery graft.  Pt was able to ambulate with a walker 2 weeks after the surgeries. Pt also had a UTI after both surgeries. Pt was admitted. Pt was not allowed to walk for 1 week. Pt was also told that pt was going to have neurological issues with his legs and feet but 2 months of PT should take care of that. Pt went home and pt had a crazy walk and felt like he was a "prancing horse. " Pt feels like he is raising his feet high without him being able to control. Had PT at [facility] after his surgery and did well but still had difficulty controlling how high he can lift his legs. Had the difficulty with controlling how high he can lift his legs after his UTI. His legs usually comes high in the middle of the night going to the bathroom (after being in bed for a few hours), as well after sitting for a while (a couple of hours) at times, kind of unpredictable. Pt sits on a chair lift. Pt also has a difficult time with swelling in his feet and ankles.  Take lasix to help. Pt sits for a while keeping his feet elevated due to the B LE swelling. B LE swelling may have started after his UTI. Pt also has a hx of 3 CVA (first 2 were TIAs 2012 and 2014), The third occurence was actually a full infarct/CVA in 2015 which decreased his endurance. Did not notice any weakness. Pt currently unable to play golf which is his leisure activity. Pt could not lift more than 5 lbs after his thoracic surgery in 11/2018. Has not seen Dr. Mali Hughes yet so does not know if his restrictions have been lifted yet. Pt furniture walks. [place] one time around December 15/16, 2020. Pt tried to break his fall and landed on his R side. Does not know if he moved anything in his chest surgery. Did not cal lhis doctor. Denies chest pain or difficulty breathing, lightheadedness or dizziness. Pt states that standing up from a  regular chair when pt has to use his arms, pt feels like everything inside him feels like is drained, his legs quiver and has to sit back down. Pt sits down and does the pursed lip breathing, pt would recover. If pt however does not use his hands, he is fine. Pt also adds numb like feeling B legs.    Patient Stated Goals  Be able to walk normally and play basketball with grandsons like he used to before his surgery in October 2020.    Currently in Pain?  No/denies         [facility] PT Assessment - 04/18/19 1103      AROM   Lumbar Flexion  WFL with low back discomfort    Lumbar Extension  limited    Lumbar - Right Side Bend  limited    Lumbar - Left Side Bend  limited wiht low back discomfort    Lumbar - Right Rotation  WFL    Lumbar - Left Rotation  [facility]                           PT Education - 04/18/19 1046    Education Details  ther-ex    Person(s) Educated  Patient    Methods  Explanation;Demonstration;Tactile cues;Verbal cues    Comprehension  Returned demonstration;Verbalized understanding      Objective    MedbridgeAccess Code: [email or web address]   No pacemakers or defibrilators.   No latex allergies    Blood pressure measurements R ARM ONLY  Check blood pressure  Surgical incisions healed.    Pt states that he called his cardiac surgeon's office and was told that his only limitation is to not strain.    E-Stim58mn Chan[place] 1: R tibialis anterior at760m Channel 2: L tibialis anterior81 mA Exercise during 10 seconds on, rest at 10 seconds off Performed to promote ankle DF to promoted foot clearance during LE swing phase of gait. Ankle DF AAROM with strap to end range.  Therapeutic exercise  Blood pressure R arm sitting, mechanically taken, normal cuff: 133/54, HR85  Seated assisted ankle DF AAROM with PT during E-stim  Standing lumbar AROM all planes, no reproduction of B lateral leg symptoms.    Jog in place 10 seconds x3  R knee discomfort    Improved exercise technique, movement at target joints, use of target muscles after mod verbal, visual, tactile cues.    Manual therapy  seated STM R lateral hamstrings and vastus lateralis to decrease tension and improve R knee mechanics. Felt  good reported by pt.      Response to treatment Pt tolerated session well without aggravation of symptoms. No feelings of being drained with sit <> stand.  Clinical impression Continued working on improving B tibialis anterior muscle strength to promote ability to DF ankle and promote better foot clearance. Pt improving function and strength observed with pt able to jog in place today for at least 10 seconds at a time. No LOB. Pt tolerated session well without aggravation of symptoms. Pt will benefit from continued skilled physical therapy services to improve strength, activity tolerance, and function.      PT Short Term Goals - 04/13/19 1047      PT SHORT TERM GOAL #1   Title  Pt will be independent with his HEP to improve strength, balance, and function.    Baseline  Pt has started his HEP (02/16/2019); pt performing some of his HEP. No questions (03/31/2019), (04/13/2019)    Time  3    Period  Weeks    Status  Achieved    Target Date  03/10/19        PT Long Term Goals - 04/13/19 1048      PT LONG TERM GOAL #1   Title  Pt will improve B LE strength by at least 1/2 MMT grade to promote ability to ambulate and perform functional tasks with less difficulty.    Time  8    Period  Weeks    Status  Achieved    Target Date  04/14/19      PT LONG TERM GOAL #2   Title  Patient will be able to ambulate without use of AD at least 500 ft to promote ability to return to PLOF.    Baseline  Currently ambulates longer distances with rollator (02/16/2019); able to ambulate > 500 ft independently. Pt reports being able to ambulate up to 1.25 miles without AD (04/13/2019)    Time  8     Period  Weeks    Status  Achieved    Target Date  04/14/19      PT LONG TERM GOAL #3   Title  Pt will improve FOTO score by at least 10 points as a demonstration of improved function.    Baseline  LE FOTO 56 (02/16/2019); 59 (03/31/2019); 61 (04/13/2019)    Time  4    Period  Weeks    Status  Partially Met    Target Date  05/12/19      PT LONG TERM GOAL #4   Title  Pt will have 5/5 B hip strength to promote ability to ambulate and perform functional tasks with less difficulty.    Time  4    Period  Weeks    Status  New    Target Date  05/12/19      PT LONG TERM GOAL #5   Title  Pt will be able to jog at least 3 min on even surface safely to promote ability to perform exercises to promote physical health.    Baseline  Currently unable to jog. (04/13/2019)    Time  4    Period  Weeks    Status  New    Target Date  05/12/19            Plan - 04/18/19 1047    Clinical Impression Statement  Continued working on improving B tibialis anterior muscle strength to promote ability to DF ankle and promote better foot clearance. Pt improving function  and strength observed with pt able to jog in place today for at least 10 seconds at a time. No LOB. Pt tolerated session well without aggravation of symptoms. Pt will benefit from continued skilled physical therapy services to improve strength, activity tolerance, and function.    Personal Factors and Comorbidities  Age;Comorbidity 3+    Comorbidities  S/P TEVER, B LE weakness, hx of aortic valve replacement, COPD, CVA    Examination-Activity Limitations  Transfers;Bed Mobility;Lift;Squat;Stairs;Carry;Locomotion Level    Stability/Clinical Decision Making  Stable/Uncomplicated    Rehab Potential  Good    PT Frequency  2x / week    PT Duration  4 weeks    PT Treatment/Interventions  Therapeutic exercise;Balance training;Neuromuscular re-education;Patient/family education;Manual techniques;Dry needling;Aquatic Therapy;TEFL teacher;Therapeutic activities    PT Next Visit Plan  hip, knee, ankle, trunk strengthening, femoral control, gait training, manual techniques, modalities PRN    Consulted and Agree with Plan of Care  Patient       Patient will benefit from skilled therapeutic intervention in order to improve the following deficits and impairments:  Postural dysfunction, Improper body mechanics, Difficulty walking, Decreased strength, Decreased endurance, Decreased balance, Abnormal gait  Visit Diagnosis: Muscle weakness (generalized)  Difficulty in walking, not elsewhere classified     Problem List Patient Active Problem List   Diagnosis Date Noted  . Bilateral finger numbness 01/20/2019  . Headache 01/20/2019  . Night sweats 01/20/2019  . UTI (urinary tract infection) 01/20/2019  . Swelling of male genital structure 01/20/2019  . Educated about COVID-19 virus infection 01/05/2019  . Cardiomyopathy ([place]) 01/05/2019  . COVID-19 11/10/2018  . Fall 05/07/2018  . Abdominal pain 01/04/2018  . Costochondritis 01/04/2018  . Thyroid nodule 01/04/2018  . Dissecting aneurysm of thoracic aorta, Stanford type B ([place]) 11/08/2017  . Anemia 11/08/2017  . Hyponatremia 11/08/2017  . Overweight 07/01/2017  . TIA (transient ischemic attack)   . Stroke ([place])   . GERD (gastroesophageal reflux disease)   . Arthritis   . Neck pain 12/01/2016  . Knee osteoarthritis 12/01/2016  . Physical deconditioning 06/24/2016  . Dyspnea on exertion 06/23/2016  . Chronic pain of right knee 12/18/2015  . Chronic obstructive pulmonary disease ([place]) 11/14/2014  . BPH (benign prostatic hyperplasia) 11/14/2014  . Preventative health care 11/14/2014  . Long term current use of anticoagulant therapy 10/29/2013  . Cerebral infarction ([place]) 06/02/2013  . H/O aortic valve replacement 12/26/2011  . History of aortic arch replacement 01/07/2011  . HTN (hypertension)   . Hyperlipidemia   . Aortic valve disease     Joneen Boers PT, DPT   04/18/2019, 12:45 PM  [place] [facility] [address], [place], [address] Phone: [phone]   Fax:  [phone]  Name: HADYN BLANCK MRN: [id number] Date of Birth: 1950-04-10

## 2019-04-20 ENCOUNTER — Ambulatory Visit: Payer: Medicare PPO

## 2019-04-26 DIAGNOSIS — B351 Tinea unguium: Secondary | ICD-10-CM | POA: Diagnosis not present

## 2019-04-26 DIAGNOSIS — L400 Psoriasis vulgaris: Secondary | ICD-10-CM | POA: Diagnosis not present

## 2019-04-26 DIAGNOSIS — D1801 Hemangioma of skin and subcutaneous tissue: Secondary | ICD-10-CM | POA: Diagnosis not present

## 2019-04-27 ENCOUNTER — Ambulatory Visit: Payer: Medicare PPO

## 2019-04-27 ENCOUNTER — Other Ambulatory Visit: Payer: Self-pay

## 2019-04-27 DIAGNOSIS — R262 Difficulty in walking, not elsewhere classified: Secondary | ICD-10-CM | POA: Diagnosis not present

## 2019-04-27 DIAGNOSIS — M6281 Muscle weakness (generalized): Secondary | ICD-10-CM

## 2019-04-27 NOTE — Therapy (Signed)
[place] [facility] [address], [place], [address] Phone: [phone]   Fax:  [phone]  Physical Therapy Treatment  Patient Details  Name: Alexander Barton MRN: [id number] Date of Birth: 1950/12/06 Referring Provider (PT): [name], MD    Encounter Date: 04/27/2019  PT End of Session - 04/27/19 1438    Visit Number  16    Number of Visits  25    Date for PT Re-Evaluation  05/12/19    Authorization Type  5    Authorization Time Period  of 10 progress report    Authorization - Visit Number  16    Authorization - Number of Visits  16   to 04/19/2019   PT Start Time  8889    PT Stop Time  1519    PT Time Calculation (min)  41 min    Activity Tolerance  Patient tolerated treatment well;Treatment limited secondary to medical complications (Comment)    Behavior During Therapy  [facility] for tasks assessed/performed       Past Medical History:  Diagnosis Date  . Aortic aneurysm, thoracic ([place])   . Aortic valve disease   . Arthritis   . BPH (benign prostatic hyperplasia)   . COPD (chronic obstructive pulmonary disease) ([place])   . GERD (gastroesophageal reflux disease)   . HTN (hypertension)   . Hypercholesterolemia   . Stroke ([place])   . TIA (transient ischemic attack)     Past Surgical History:  Procedure Laterality Date  . ACHILLES TENDON REPAIR    . AORTIC VALVE REPLACEMENT     #23 On-X valve conduit  . ASCENDING AORTIC ANEURYSM REPAIR    . BACK SURGERY    . FOOT SURGERY      There were no vitals filed for this visit.  Subjective Assessment - 04/27/19 1440    Subjective  Has been having trouble with his spleen or pancreas for the past week. Seeing his doctor soon. No unexplained changes in weight. No pain, took Tylenol recently.    Pertinent History  B LE weakness. Pt states that he does not usually use his rollator but does not know when he will feel out of breath.  Pt has a hx aortic valve  replacement 2008. Also had another Sternotomy 12/01/2018. 12/09/2018 pt underwent TEVAR with a L subclavion artery and coratid artery graft.  Pt was able to ambulate with a walker 2 weeks after the surgeries. Pt also had a UTI after both surgeries. Pt was admitted. Pt was not allowed to walk for 1 week. Pt was also told that pt was going to have neurological issues with his legs and feet but 2 months of PT should take care of that. Pt went home and pt had a crazy walk and felt like he was a "prancing horse. " Pt feels like he is raising his feet high without him being able to control. Had PT at [facility] after his surgery and did well but still had difficulty controlling how high he can lift his legs. Had the difficulty with controlling how high he can lift his legs after his UTI. His legs usually comes high in the middle of the night going to the bathroom (after being in bed for a few hours), as well after sitting for a while (a couple of hours) at times, kind of unpredictable. Pt sits on a chair lift. Pt also has a difficult time with swelling in his feet and ankles. Take  lasix to help. Pt sits for a while keeping his feet elevated due to the B LE swelling. B LE swelling may have started after his UTI. Pt also has a hx of 3 CVA (first 2 were TIAs 2012 and 2014), The third occurence was actually a full infarct/CVA in 2015 which decreased his endurance. Did not notice any weakness. Pt currently unable to play golf which is his leisure activity. Pt could not lift more than 5 lbs after his thoracic surgery in 11/2018. Has not seen Dr. Mali Hughes yet so does not know if his restrictions have been lifted yet. Pt furniture walks. [place] one time around December 15/16, 2020. Pt tried to break his fall and landed on his R side. Does not know if he moved anything in his chest surgery. Did not cal lhis doctor. Denies chest pain or difficulty breathing, lightheadedness or dizziness. Pt states that standing up from a regular  chair when pt has to use his arms, pt feels like everything inside him feels like is drained, his legs quiver and has to sit back down. Pt sits down and does the pursed lip breathing, pt would recover. If pt however does not use his hands, he is fine. Pt also adds numb like feeling B legs.    Patient Stated Goals  Be able to walk normally and play basketball with grandsons like he used to before his surgery in October 2020.    Currently in Pain?  No/denies                               PT Education - 04/27/19 1456    Education Details  ther-ex    Person(s) Educated  Patient    Methods  Explanation;Demonstration;Tactile cues;Verbal cues    Comprehension  Returned demonstration;Verbalized understanding       Objective    MedbridgeAccess Code: [email or web address]   No pacemakers or defibrilators.   No latex allergies    Blood pressure measurements R ARM ONLY  Check blood pressure  Surgical incisions healed.    Pt states that he called his cardiac surgeon's office and was told that his only limitation is to not strain.    E-[id number] [place] 1: R tibialis anterior at769m Channel 2: L tibialis [id number] during 10 seconds on, rest at 10 seconds off Performed to promote ankle DF to promoted foot clearance during LE swing phase of gait. Ankle DF AAROM with strap to end range.  Therapeutic exercise  Blood pressure R arm sitting, mechanically taken, normal cuff: 105/55, HR91  Seated assisted ankle DF AAROM with PT during E-stim  Cues for increased tibialis muscle activation  Improving ability to hold end range DF bilaterally  Seated manually resisted knee flexion targeting medial hamstrings   R 10x3 (difficult)   Jog in place 10 seconds x3. Rest breaks secondary to fatigue             minimal to no R knee discomfort   Then 10 seconds again, no R knee pain after sitting.   Then 10 seconds  moving forward slowly. No pain, no LOB.   Improved exercise technique, movement at target joints, use of target muscles after mod verbal, visual, tactile cues.       Response to treatment Pt tolerated session well without aggravation of symptoms.     Clinical impression Improved ability to hold B ankle DF position at end range observed and palpated. Pt also able  to jog in place as well as forward (short distance) for 10 seconds at a time without LOB. Rest breaks provided secondary to fatigue. Continued working on improving B tibialis anterior strength as well as overall LE strength and activity tolerance. Decreased R knee pain with standing activities with increasing medial hamstrings activation to promote better mechanics at his R knee joint. PT will benefit from continued skilled physical therapy services to decrease R knee pain. Improve LE strength, endurance, and function.     PT Short Term Goals - 04/13/19 1047      PT SHORT TERM GOAL #1   Title  Pt will be independent with his HEP to improve strength, balance, and function.    Baseline  Pt has started his HEP (02/16/2019); pt performing some of his HEP. No questions (03/31/2019), (04/13/2019)    Time  3    Period  Weeks    Status  Achieved    Target Date  03/10/19        PT Long Term Goals - 04/13/19 1048      PT LONG TERM GOAL #1   Title  Pt will improve B LE strength by at least 1/2 MMT grade to promote ability to ambulate and perform functional tasks with less difficulty.    Time  8    Period  Weeks    Status  Achieved    Target Date  04/14/19      PT LONG TERM GOAL #2   Title  Patient will be able to ambulate without use of AD at least 500 ft to promote ability to return to PLOF.    Baseline  Currently ambulates longer distances with rollator (02/16/2019); able to ambulate > 500 ft independently. Pt reports being able to ambulate up to 1.25 miles without AD (04/13/2019)    Time  8    Period  Weeks     Status  Achieved    Target Date  04/14/19      PT LONG TERM GOAL #3   Title  Pt will improve FOTO score by at least 10 points as a demonstration of improved function.    Baseline  LE FOTO 56 (02/16/2019); 59 (03/31/2019); 61 (04/13/2019)    Time  4    Period  Weeks    Status  Partially Met    Target Date  05/12/19      PT LONG TERM GOAL #4   Title  Pt will have 5/5 B hip strength to promote ability to ambulate and perform functional tasks with less difficulty.    Time  4    Period  Weeks    Status  New    Target Date  05/12/19      PT LONG TERM GOAL #5   Title  Pt will be able to jog at least 3 min on even surface safely to promote ability to perform exercises to promote physical health.    Baseline  Currently unable to jog. (04/13/2019)    Time  4    Period  Weeks    Status  New    Target Date  05/12/19            Plan - 04/27/19 1457    Clinical Impression Statement  Improved ability to hold B ankle DF position at end range observed and palpated. Pt also able to jog in place as well as forward (short distance) for 10 seconds at a time without LOB. Rest breaks provided secondary to fatigue. Continued  working on improving B tibialis anterior strength as well as overall LE strength and activity tolerance. Decreased R knee pain with standing activities with increasing medial hamstrings activation to promote better mechanics at his R knee joint. PT will benefit from continued skilled physical therapy services to decrease R knee pain. Improve LE strength, endurance, and function.    Personal Factors and Comorbidities  Age;Comorbidity 3+    Comorbidities  S/P TEVER, B LE weakness, hx of aortic valve replacement, COPD, CVA    Examination-Activity Limitations  Transfers;Bed Mobility;Lift;Squat;Stairs;Carry;Locomotion Level    Stability/Clinical Decision Making  Stable/Uncomplicated    Rehab Potential  Good    PT Frequency  2x / week    PT Duration  4 weeks    PT Treatment/Interventions   Therapeutic exercise;Balance training;Neuromuscular re-education;Patient/family education;Manual techniques;Dry needling;Aquatic Therapy;Advice worker;Therapeutic activities    PT Next Visit Plan  hip, knee, ankle, trunk strengthening, femoral control, gait training, manual techniques, modalities PRN    Consulted and Agree with Plan of Care  Patient       Patient will benefit from skilled therapeutic intervention in order to improve the following deficits and impairments:  Postural dysfunction, Improper body mechanics, Difficulty walking, Decreased strength, Decreased endurance, Decreased balance, Abnormal gait  Visit Diagnosis: Muscle weakness (generalized)  Difficulty in walking, not elsewhere classified     Problem List Patient Active Problem List   Diagnosis Date Noted  . Bilateral finger numbness 01/20/2019  . Headache 01/20/2019  . Night sweats 01/20/2019  . UTI (urinary tract infection) 01/20/2019  . Swelling of male genital structure 01/20/2019  . Educated about COVID-19 virus infection 01/05/2019  . Cardiomyopathy ([place]) 01/05/2019  . COVID-19 11/10/2018  . Fall 05/07/2018  . Abdominal pain 01/04/2018  . Costochondritis 01/04/2018  . Thyroid nodule 01/04/2018  . Dissecting aneurysm of thoracic aorta, [place] type B ([place]) 11/08/2017  . Anemia 11/08/2017  . Hyponatremia 11/08/2017  . Overweight 07/01/2017  . TIA (transient ischemic attack)   . Stroke ([place])   . GERD (gastroesophageal reflux disease)   . Arthritis   . Neck pain 12/01/2016  . Knee osteoarthritis 12/01/2016  . Physical deconditioning 06/24/2016  . Dyspnea on exertion 06/23/2016  . Chronic pain of right knee 12/18/2015  . Chronic obstructive pulmonary disease ([place]) 11/14/2014  . BPH (benign prostatic hyperplasia) 11/14/2014  . Preventative health care 11/14/2014  . Long term current use of anticoagulant therapy 10/29/2013  . Cerebral infarction ([place]) 06/02/2013  . H/O aortic  valve replacement 12/26/2011  . History of aortic arch replacement 01/07/2011  . HTN (hypertension)   . Hyperlipidemia   . Aortic valve disease     Joneen Boers PT, DPT   04/27/2019, 6:13 PM  [place] [facility] [address] [place], [address] Phone: [phone]   Fax:  [phone]  Name: Alexander Barton MRN: [id number] Date of Birth: 1950/12/30

## 2019-04-28 ENCOUNTER — Telehealth: Payer: Self-pay

## 2019-04-28 NOTE — Telephone Encounter (Signed)
Patient reports nosebleeds x 15 + minutes both last night and this morning.  Both times was able to stop with laying flat and using pressure.   Advised that he get Afrin nasal spray (bid x 2 days) and nasal saline tid-qid) to use for now, but if he starts to bleed again, he should go to ED/UC for evaluation/cauterization.

## 2019-04-28 NOTE — Telephone Encounter (Signed)
Pt called and left a voicemail stating that they have been experiencing severe epistaxis and would like a phone call from a pharmd asap.He is a coumadin patient that checks at home.

## 2019-05-02 ENCOUNTER — Other Ambulatory Visit: Payer: Self-pay

## 2019-05-02 ENCOUNTER — Ambulatory Visit: Payer: Medicare PPO

## 2019-05-02 DIAGNOSIS — M6281 Muscle weakness (generalized): Secondary | ICD-10-CM

## 2019-05-02 DIAGNOSIS — R262 Difficulty in walking, not elsewhere classified: Secondary | ICD-10-CM

## 2019-05-02 NOTE — Patient Instructions (Signed)
Access Code: [email or web address] URL: [email or web address] Date: 05/02/2019 Prepared by: Joneen Boers  Exercises Seated Heel Raise - 3 x daily - 7 x weekly - 10 reps - 3 sets - 5 seconds hold Seated Hamstring Curl with Anchored Resistance - 1 x daily - 7 x weekly - 10 reps - 3 sets Long Sitting Calf Stretch with Strap - 1 x daily - 7 x weekly - 10 reps - 2 sets - 5 seconds hold Shoulder extension with resistance - Neutral - 1 x daily - 7 x weekly - 10 reps - 2 sets Seated Hip Adduction Isometrics with Ball - 1 x daily - 7 x weekly - 10 reps - 3 sets - 5 seconds hold Standing Hip Abduction with Resistance at Ankles and Counter Support - 1 x daily - 7 x weekly - 2-3 sets - 10 reps

## 2019-05-02 NOTE — Therapy (Signed)
[place] [address], [place], [address] Phone: [phone]   Fax:  [phone]  Physical Therapy Treatment   Patient Details  Name: Alexander Barton MRN: [id number] Date of Birth: [birth year] Referring Provider (PT): [name], MD    Encounter Date: 05/02/2019  PT End of Session - 05/02/19 1521    Visit Number  17    Number of Visits  25    Date for PT Re-Evaluation  05/12/19    Authorization Type  7    Authorization Time Period  of 10 progress report    Authorization - Visit Number  1    Authorization - Number of Visits  8   to 04/19/2019   PT Start Time  1520    PT Stop Time  1602    PT Time Calculation (min)  42 min    Activity Tolerance  Patient tolerated treatment well;Treatment limited secondary to medical complications (Comment)    Behavior During Therapy  [facility] for tasks assessed/performed       Past Medical History:  Diagnosis Date  . Aortic aneurysm, thoracic ([place])   . Aortic valve disease   . Arthritis   . BPH (benign prostatic hyperplasia)   . COPD (chronic obstructive pulmonary disease) ([place])   . GERD (gastroesophageal reflux disease)   . HTN (hypertension)   . Hypercholesterolemia   . Stroke ([place])   . TIA (transient ischemic attack)     Past Surgical History:  Procedure Laterality Date  . ACHILLES TENDON REPAIR    . AORTIC VALVE REPLACEMENT     #23 On-X valve conduit  . ASCENDING AORTIC ANEURYSM REPAIR    . BACK SURGERY    . FOOT SURGERY      There were no vitals filed for this visit.  Subjective Assessment - 05/02/19 1522    Subjective  Doing pretty good, strength and balance is pretty good. Getting close to where he would like to be. R knee gave way going down steps yesterday at church. Pt was holding a rail.    Pertinent History  B LE weakness. Pt states that he does not usually use his rollator but does not know when he will feel out of breath.  Pt has a hx  aortic valve replacement 2008. Also had another Sternotomy 12/01/2018. 12/09/2018 pt underwent TEVAR with a L subclavion artery and coratid artery graft.  Pt was able to ambulate with a walker 2 weeks after the surgeries. Pt also had a UTI after both surgeries. Pt was admitted. Pt was not allowed to walk for 1 week. Pt was also told that pt was going to have neurological issues with his legs and feet but 2 months of PT should take care of that. Pt went home and pt had a crazy walk and felt like he was a "prancing horse. " Pt feels like he is raising his feet high without him being able to control. Had PT at [facility] after his surgery and did well but still had difficulty controlling how high he can lift his legs. Had the difficulty with controlling how high he can lift his legs after his UTI. His legs usually comes high in the middle of the night going to the bathroom (after being in bed for a few hours), as well after sitting for a while (a couple of hours) at times, kind of unpredictable. Pt sits on a chair lift. Pt also has a difficult time with  swelling in his feet and ankles. Take lasix to help. Pt sits for a while keeping his feet elevated due to the B LE swelling. B LE swelling may have started after his UTI. Pt also has a hx of 3 CVA (first 2 were TIAs 2012 and 2014), The third occurence was actually a full infarct/CVA in 2015 which decreased his endurance. Did not notice any weakness. Pt currently unable to play golf which is his leisure activity. Pt could not lift more than 5 lbs after his thoracic surgery in 11/2018. Has not seen Dr. Mali Hughes yet so does not know if his restrictions have been lifted yet. Pt furniture walks. [place] one time around December 15/16, 2020. Pt tried to break his fall and landed on his R side. Does not know if he moved anything in his chest surgery. Did not cal lhis doctor. Denies chest pain or difficulty breathing, lightheadedness or dizziness. Pt states that standing up  from a regular chair when pt has to use his arms, pt feels like everything inside him feels like is drained, his legs quiver and has to sit back down. Pt sits down and does the pursed lip breathing, pt would recover. If pt however does not use his hands, he is fine. Pt also adds numb like feeling B legs.    Patient Stated Goals  Be able to walk normally and play basketball with grandsons like he used to before his surgery in October 2020.    Currently in Pain?  No/denies                               PT Education - 05/02/19 1554    Education Details  ther-ex, HEP    Person(s) Educated  Patient    Methods  Explanation;Demonstration;Tactile cues;Verbal cues;Handout    Comprehension  Returned demonstration;Verbalized understanding         Objective    MedbridgeAccess Code: [email or web address]   No pacemakers or defibrilators.   No latex allergies    Blood pressure measurements R ARM ONLY  Check blood pressure  Surgical incisions healed.    Pt states that he called his cardiac surgeon's office and was told that his only limitation is to not strain.     Therapeutic exercise   Seated assisted ankle DF AAROM with PT   R 10x3 with 5 seconds   L 10x3 with 5 seconds  Jog in place 10 seconds x3. Rest breaks secondary to fatigue  Pt states R knee wants to give way  Ascending and descending 4 regular steps with L rail assist 2x  Decreased R pelvic, and femoral control   Forward step up onto 4 inch step with on UE assist with green band resistng hip abduction/ER  R 5x  L 5x  Difficulty with pelvic and femoral control L > R  Standing hip abduction green band around distal thighs B UE assist   R 10x3  L 10x3 standing hip extension resisting green band around thighs with B UE assist to promote glute max strengthening  R 10x3  L 10x3   Improved exercise technique, movement at target joints, use of target muscles after mod  verbal, visual, tactile cues.       Response to treatment Pt tolerated session well without aggravation of symptoms.     Clinical impression Upon assessment of stair negotiation, pt demonstates decreased B pelvic, femoral control R > L with reproduction or R  knee joint discomfort and buckling. Worked on glute med and max strengthening as well as femoral control to help ameliorate. Improved R knee comfort level with stepping down with L LE with cues for femoral control. Pt tolerated session well without aggravation of symptoms. Pt will benefit from continued skilled physical therapy services to decrease pain, improve strength and function.       PT Short Term Goals - 04/13/19 1047      PT SHORT TERM GOAL #1   Title  Pt will be independent with his HEP to improve strength, balance, and function.    Baseline  Pt has started his HEP (02/16/2019); pt performing some of his HEP. No questions (03/31/2019), (04/13/2019)    Time  3    Period  Weeks    Status  Achieved    Target Date  03/10/19        PT Long Term Goals - 04/13/19 1048      PT LONG TERM GOAL #1   Title  Pt will improve B LE strength by at least 1/2 MMT grade to promote ability to ambulate and perform functional tasks with less difficulty.    Time  8    Period  Weeks    Status  Achieved    Target Date  04/14/19      PT LONG TERM GOAL #2   Title  Patient will be able to ambulate without use of AD at least 500 ft to promote ability to return to PLOF.    Baseline  Currently ambulates longer distances with rollator (02/16/2019); able to ambulate > 500 ft independently. Pt reports being able to ambulate up to 1.25 miles without AD (04/13/2019)    Time  8    Period  Weeks    Status  Achieved    Target Date  04/14/19      PT LONG TERM GOAL #3   Title  Pt will improve FOTO score by at least 10 points as a demonstration of improved function.    Baseline  LE FOTO 56 (02/16/2019); 59 (03/31/2019); 61 (04/13/2019)     Time  4    Period  Weeks    Status  Partially Met    Target Date  05/12/19      PT LONG TERM GOAL #4   Title  Pt will have 5/5 B hip strength to promote ability to ambulate and perform functional tasks with less difficulty.    Time  4    Period  Weeks    Status  New    Target Date  05/12/19      PT LONG TERM GOAL #5   Title  Pt will be able to jog at least 3 min on even surface safely to promote ability to perform exercises to promote physical health.    Baseline  Currently unable to jog. (04/13/2019)    Time  4    Period  Weeks    Status  New    Target Date  05/12/19            Plan - 05/02/19 1920    Clinical Impression Statement  Upon assessment of stair negotiation, pt demonstates decreased B pelvic, femoral control R > L with reproduction or R knee joint discomfort and buckling. Worked on glute med and max strengthening as well as femoral control to help ameliorate. Improved R knee comfort level with stepping down with L LE with cues for femoral control. Pt tolerated session well without aggravation of  symptoms. Pt will benefit from continued skilled physical therapy services to decrease pain, improve strength and function.    Personal Factors and Comorbidities  Age;Comorbidity 3+    Comorbidities  S/P TEVER, B LE weakness, hx of aortic valve replacement, COPD, CVA    Examination-Activity Limitations  Transfers;Bed Mobility;Lift;Squat;Stairs;Carry;Locomotion Level    Stability/Clinical Decision Making  Stable/Uncomplicated    Rehab Potential  Good    PT Frequency  2x / week    PT Duration  4 weeks    PT Treatment/Interventions  Therapeutic exercise;Balance training;Neuromuscular re-education;Patient/family education;Manual techniques;Dry needling;Aquatic Therapy;Advice worker;Therapeutic activities    PT Next Visit Plan  hip, knee, ankle, trunk strengthening, femoral control, gait training, manual techniques, modalities PRN    Consulted and Agree with  Plan of Care  Patient       Patient will benefit from skilled therapeutic intervention in order to improve the following deficits and impairments:  Postural dysfunction, Improper body mechanics, Difficulty walking, Decreased strength, Decreased endurance, Decreased balance, Abnormal gait  Visit Diagnosis: Muscle weakness (generalized)  Difficulty in walking, not elsewhere classified     Problem List Patient Active Problem List   Diagnosis Date Noted  . Bilateral finger numbness 01/20/2019  . Headache 01/20/2019  . Night sweats 01/20/2019  . UTI (urinary tract infection) 01/20/2019  . Swelling of male genital structure 01/20/2019  . Educated about COVID-19 virus infection 01/05/2019  . Cardiomyopathy ([place]) 01/05/2019  . COVID-19 11/10/2018  . Fall 05/07/2018  . Abdominal pain 01/04/2018  . Costochondritis 01/04/2018  . Thyroid nodule 01/04/2018  . Dissecting aneurysm of thoracic aorta, Stanford type B ([place]) 11/08/2017  . Anemia 11/08/2017  . Hyponatremia 11/08/2017  . Overweight 07/01/2017  . TIA (transient ischemic attack)   . Stroke ([place])   . GERD (gastroesophageal reflux disease)   . Arthritis   . Neck pain 12/01/2016  . Knee osteoarthritis 12/01/2016  . Physical deconditioning 06/24/2016  . Dyspnea on exertion 06/23/2016  . Chronic pain of right knee 12/18/2015  . Chronic obstructive pulmonary disease ([place]) 11/14/2014  . BPH (benign prostatic hyperplasia) 11/14/2014  . Preventative health care 11/14/2014  . Long term current use of anticoagulant therapy 10/29/2013  . Cerebral infarction ([place]) 06/02/2013  . H/O aortic valve replacement 12/26/2011  . History of aortic arch replacement 01/07/2011  . HTN (hypertension)   . Hyperlipidemia   . Aortic valve disease     Joneen Boers PT, DPT   05/02/2019, 7:25 PM  [place] [facility] [address], [place], [address] Phone: [phone]   Fax:   [phone]  Name: Alexander Barton MRN: [id number] Date of Birth: Apr 18, 1950

## 2019-05-03 ENCOUNTER — Ambulatory Visit (INDEPENDENT_AMBULATORY_CARE_PROVIDER_SITE_OTHER): Payer: Medicare PPO | Admitting: Pharmacist

## 2019-05-03 DIAGNOSIS — R040 Epistaxis: Secondary | ICD-10-CM | POA: Diagnosis not present

## 2019-05-03 DIAGNOSIS — G459 Transient cerebral ischemic attack, unspecified: Secondary | ICD-10-CM

## 2019-05-03 DIAGNOSIS — Z7901 Long term (current) use of anticoagulants: Secondary | ICD-10-CM

## 2019-05-03 LAB — POCT INR: INR: 2.7 (ref 2.0–3.0)

## 2019-05-04 ENCOUNTER — Other Ambulatory Visit: Payer: Self-pay

## 2019-05-04 ENCOUNTER — Ambulatory Visit: Payer: Medicare PPO

## 2019-05-04 DIAGNOSIS — M6281 Muscle weakness (generalized): Secondary | ICD-10-CM | POA: Diagnosis not present

## 2019-05-04 DIAGNOSIS — R262 Difficulty in walking, not elsewhere classified: Secondary | ICD-10-CM | POA: Diagnosis not present

## 2019-05-04 NOTE — Patient Instructions (Addendum)
  Access Code: [email or web address] URL: [email or web address] Date: 05/04/2019 Prepared by: Joneen Boers  Exercises Seated Heel Raise - 3 x daily - 7 x weekly - 10 reps - 3 sets - 5 seconds hold Seated Hamstring Curl with Anchored Resistance - 1 x daily - 7 x weekly - 10 reps - 3 sets Long Sitting Calf Stretch with Strap - 1 x daily - 7 x weekly - 10 reps - 2 sets - 5 seconds hold Shoulder extension with resistance - Neutral - 1 x daily - 7 x weekly - 10 reps - 2 sets Seated Hip Adduction Isometrics with Ball - 1 x daily - 7 x weekly - 10 reps - 3 sets - 5 seconds hold Standing Hip Abduction with Resistance at Ankles and Counter Support - 1 x daily - 7 x weekly - 2-3 sets - 10 reps Sidelying Hip Abduction - 1 x daily - 3-4 x weekly - 3 sets - 10 reps

## 2019-05-04 NOTE — Therapy (Signed)
[place] [facility] [address] [place], [address] Phone: [phone]   Fax:  [phone]  Physical Therapy Treatment  Patient Details  Name: Alexander Barton MRN: [id number] Date of Birth: [birth year] Referring Provider (PT): [name], MD    Encounter Date: 05/04/2019  PT End of Session - 05/04/19 1036    Visit Number  18    Number of Visits  25    Date for PT Re-Evaluation  05/12/19    Authorization Type  8    Authorization Time Period  of 10 progress report    Authorization - Visit Number  2    Authorization - Number of Visits  8   to 04/19/2019   PT Start Time  1036    PT Stop Time  1117    PT Time Calculation (min)  41 min    Activity Tolerance  Patient tolerated treatment well;Treatment limited secondary to medical complications (Comment)    Behavior During Therapy  [facility] for tasks assessed/performed       Past Medical History:  Diagnosis Date  . Aortic aneurysm, thoracic ([place])   . Aortic valve disease   . Arthritis   . BPH (benign prostatic hyperplasia)   . COPD (chronic obstructive pulmonary disease) ([place])   . GERD (gastroesophageal reflux disease)   . HTN (hypertension)   . Hypercholesterolemia   . Stroke ([place])   . TIA (transient ischemic attack)     Past Surgical History:  Procedure Laterality Date  . ACHILLES TENDON REPAIR    . AORTIC VALVE REPLACEMENT     #23 On-X valve conduit  . ASCENDING AORTIC ANEURYSM REPAIR    . BACK SURGERY    . FOOT SURGERY      There were no vitals filed for this visit.  Subjective Assessment - 05/04/19 1038    Subjective  Got at AFO and it feels better walking and more steady. R knee is probably about like it was. Still tender and weak.    Pertinent History  B LE weakness. Pt states that he does not usually use his rollator but does not know when he will feel out of breath.  Pt has a hx aortic valve replacement 2008. Also had another Sternotomy  12/01/2018. 12/09/2018 pt underwent TEVAR with a L subclavion artery and coratid artery graft.  Pt was able to ambulate with a walker 2 weeks after the surgeries. Pt also had a UTI after both surgeries. Pt was admitted. Pt was not allowed to walk for 1 week. Pt was also told that pt was going to have neurological issues with his legs and feet but 2 months of PT should take care of that. Pt went home and pt had a crazy walk and felt like he was a "prancing horse. " Pt feels like he is raising his feet high without him being able to control. Had PT at [facility] after his surgery and did well but still had difficulty controlling how high he can lift his legs. Had the difficulty with controlling how high he can lift his legs after his UTI. His legs usually comes high in the middle of the night going to the bathroom (after being in bed for a few hours), as well after sitting for a while (a couple of hours) at times, kind of unpredictable. Pt sits on a chair lift. Pt also has a difficult time with swelling in his feet and ankles. Take lasix to help. Pt  sits for a while keeping his feet elevated due to the B LE swelling. B LE swelling may have started after his UTI. Pt also has a hx of 3 CVA (first 2 were TIAs 2012 and 2014), The third occurence was actually a full infarct/CVA in 2015 which decreased his endurance. Did not notice any weakness. Pt currently unable to play golf which is his leisure activity. Pt could not lift more than 5 lbs after his thoracic surgery in 11/2018. Has not seen Dr. Mali Hughes yet so does not know if his restrictions have been lifted yet. Pt furniture walks. [place] one time around December 15/16, 2020. Pt tried to break his fall and landed on his R side. Does not know if he moved anything in his chest surgery. Did not cal lhis doctor. Denies chest pain or difficulty breathing, lightheadedness or dizziness. Pt states that standing up from a regular chair when pt has to use his arms, pt feels  like everything inside him feels like is drained, his legs quiver and has to sit back down. Pt sits down and does the pursed lip breathing, pt would recover. If pt however does not use his hands, he is fine. Pt also adds numb like feeling B legs.    Patient Stated Goals  Be able to walk normally and play basketball with grandsons like he used to before his surgery in October 2020.    Currently in Pain?  No/denies                               PT Education - 05/04/19 1040    Education Details  ther-ex, HEP    Person(s) Educated  Patient    Methods  Explanation;Demonstration;Tactile cues;Verbal cues;Handout    Comprehension  Returned demonstration;Verbalized understanding      Objective    MedbridgeAccess Code: [email or web address]   No pacemakers or defibrilators.   No latex allergies    Blood pressure measurements R ARM ONLY  Check blood pressure  Surgical incisions healed.    Pt states that he called his cardiac surgeon's office and was told that his only limitation is to not strain.     Therapeutic exercise BP: 124/62, R arm sitting, mechanically taken, normal cuff. HR 97  S/L hip abduction   R 10x3  L 10x3   Bridge 10x3 to promote glute max muscle strength  Seated hip ER to promote glute max strength and decrease genu valgus with closed chain tasks  R 10x3  L 10x3   Forward step up onto 4 inch step with on UE assist             R 10x3             L 10x3    SLS with light touch assist PRN  R 10x10 seconds for 2 sets. Good glute med muscle use felt.    L 10x10 seconds      Improved exercise technique, movement at target joints, use of target muscles after mod verbal, visual, tactile cues.       Response to treatment Pt tolerated session well without aggravation of symptoms.    Clinical impression  Improving activity tolerance with exercises, with pt needing less rest breaks.  Improved R foot clearance during R LE swing phase when pt wears his AFO. Continued working on R glute med and max strengthening to promote femoral control during stair negotiation and jogging to help decrease  R knee pain. Pt tolerated session well without aggravation of symptoms. Pt will benefit from continued skilled physical therapy services to decrease R knee pain, improve strength, endurance, and function.    PT Short Term Goals - 04/13/19 1047      PT SHORT TERM GOAL #1   Title  Pt will be independent with his HEP to improve strength, balance, and function.    Baseline  Pt has started his HEP (02/16/2019); pt performing some of his HEP. No questions (03/31/2019), (04/13/2019)    Time  3    Period  Weeks    Status  Achieved    Target Date  03/10/19        PT Long Term Goals - 04/13/19 1048      PT LONG TERM GOAL #1   Title  Pt will improve B LE strength by at least 1/2 MMT grade to promote ability to ambulate and perform functional tasks with less difficulty.    Time  8    Period  Weeks    Status  Achieved    Target Date  04/14/19      PT LONG TERM GOAL #2   Title  Patient will be able to ambulate without use of AD at least 500 ft to promote ability to return to PLOF.    Baseline  Currently ambulates longer distances with rollator (02/16/2019); able to ambulate > 500 ft independently. Pt reports being able to ambulate up to 1.25 miles without AD (04/13/2019)    Time  8    Period  Weeks    Status  Achieved    Target Date  04/14/19      PT LONG TERM GOAL #3   Title  Pt will improve FOTO score by at least 10 points as a demonstration of improved function.    Baseline  LE FOTO 56 (02/16/2019); 59 (03/31/2019); 61 (04/13/2019)    Time  4    Period  Weeks    Status  Partially Met    Target Date  05/12/19      PT LONG TERM GOAL #4   Title  Pt will have 5/5 B hip strength to promote ability to ambulate and perform functional tasks with less difficulty.    Time  4    Period  Weeks     Status  New    Target Date  05/12/19      PT LONG TERM GOAL #5   Title  Pt will be able to jog at least 3 min on even surface safely to promote ability to perform exercises to promote physical health.    Baseline  Currently unable to jog. (04/13/2019)    Time  4    Period  Weeks    Status  New    Target Date  05/12/19            Plan - 05/04/19 1057    Clinical Impression Statement  Improving activity tolerance with exercises, with pt needing less rest breaks. Improved R foot clearance during R LE swing phase when pt wears his AFO. Continued working on R glute med and max strengthening to promote femoral control during stair negotiation and jogging to help decrease R knee pain. Pt tolerated session well without aggravation of symptoms. Pt will benefit from continued skilled physical therapy services to decrease R knee pain, improve strength, endurance, and function.    Personal Factors and Comorbidities  Age;Comorbidity 3+    Comorbidities  S/P TEVER, B LE  weakness, hx of aortic valve replacement, COPD, CVA    Examination-Activity Limitations  Transfers;Bed Mobility;Lift;Squat;Stairs;Carry;Locomotion Level    Stability/Clinical Decision Making  Stable/Uncomplicated    Rehab Potential  Good    PT Frequency  2x / week    PT Duration  4 weeks    PT Treatment/Interventions  Therapeutic exercise;Balance training;Neuromuscular re-education;Patient/family education;Manual techniques;Dry needling;Aquatic Therapy;Advice worker;Therapeutic activities    PT Next Visit Plan  hip, knee, ankle, trunk strengthening, femoral control, gait training, manual techniques, modalities PRN    Consulted and Agree with Plan of Care  Patient       Patient will benefit from skilled therapeutic intervention in order to improve the following deficits and impairments:  Postural dysfunction, Improper body mechanics, Difficulty walking, Decreased strength, Decreased endurance, Decreased  balance, Abnormal gait  Visit Diagnosis: Difficulty in walking, not elsewhere classified  Muscle weakness (generalized)     Problem List Patient Active Problem List   Diagnosis Date Noted  . Bilateral finger numbness 01/20/2019  . Headache 01/20/2019  . Night sweats 01/20/2019  . UTI (urinary tract infection) 01/20/2019  . Swelling of male genital structure 01/20/2019  . Educated about COVID-19 virus infection 01/05/2019  . Cardiomyopathy ([place]) 01/05/2019  . COVID-19 11/10/2018  . Fall 05/07/2018  . Abdominal pain 01/04/2018  . Costochondritis 01/04/2018  . Thyroid nodule 01/04/2018  . Dissecting aneurysm of thoracic aorta, Stanford type B ([place]) 11/08/2017  . Anemia 11/08/2017  . Hyponatremia 11/08/2017  . Overweight 07/01/2017  . TIA (transient ischemic attack)   . Stroke ([place])   . GERD (gastroesophageal reflux disease)   . Arthritis   . Neck pain 12/01/2016  . Knee osteoarthritis 12/01/2016  . Physical deconditioning 06/24/2016  . Dyspnea on exertion 06/23/2016  . Chronic pain of right knee 12/18/2015  . Chronic obstructive pulmonary disease ([place]) 11/14/2014  . BPH (benign prostatic hyperplasia) 11/14/2014  . Preventative health care 11/14/2014  . Long term current use of anticoagulant therapy 10/29/2013  . Cerebral infarction ([place]) 06/02/2013  . H/O aortic valve replacement 12/26/2011  . History of aortic arch replacement 01/07/2011  . HTN (hypertension)   . Hyperlipidemia   . Aortic valve disease     Joneen Boers PT, DPT   05/04/2019, 11:30 AM  [place] [facility] [address], [place], [address] Phone: [phone]   Fax:  [phone]  Name: ROCKNE DEARINGER MRN: [id number] Date of Birth: 01-05-1951

## 2019-05-09 ENCOUNTER — Ambulatory Visit: Payer: Medicare PPO | Attending: Family Medicine

## 2019-05-09 ENCOUNTER — Other Ambulatory Visit: Payer: Self-pay

## 2019-05-09 DIAGNOSIS — R262 Difficulty in walking, not elsewhere classified: Secondary | ICD-10-CM | POA: Diagnosis not present

## 2019-05-09 DIAGNOSIS — M6281 Muscle weakness (generalized): Secondary | ICD-10-CM | POA: Insufficient documentation

## 2019-05-09 NOTE — Therapy (Signed)
[facility] [address], [place], [address] Phone: [phone]   Fax:  [phone]  Physical Therapy Treatment  Patient Details  Name: Alexander Barton MRN: [id number] Date of Birth: 11-15-1950 Referring Provider (PT): [name], MD    Encounter Date: 05/09/2019  PT End of Session - 05/09/19 1033    Visit Number  19    Number of Visits  25    Date for PT Re-Evaluation  05/12/19    Authorization Type  9    Authorization Time Period  of 10 progress report    Authorization - Visit Number  3    Authorization - Number of Visits  8   to 04/19/2019   PT Start Time  1033    PT Stop Time  1120    PT Time Calculation (min)  47 min    Activity Tolerance  Patient tolerated treatment well;Treatment limited secondary to medical complications (Comment)    Behavior During Therapy  [facility] for tasks assessed/performed       Past Medical History:  Diagnosis Date  . Aortic aneurysm, thoracic ([place])   . Aortic valve disease   . Arthritis   . BPH (benign prostatic hyperplasia)   . COPD (chronic obstructive pulmonary disease) ([place])   . GERD (gastroesophageal reflux disease)   . HTN (hypertension)   . Hypercholesterolemia   . Stroke ([place])   . TIA (transient ischemic attack)     Past Surgical History:  Procedure Laterality Date  . ACHILLES TENDON REPAIR    . AORTIC VALVE REPLACEMENT     #23 On-X valve conduit  . ASCENDING AORTIC ANEURYSM REPAIR    . BACK SURGERY    . FOOT SURGERY      There were no vitals filed for this visit.  Subjective Assessment - 05/09/19 1035    Subjective  Getting up at around 7 or 8 am, pt feel like his legs and knees feel week. Has been happening for about a week now. Knees feel like they are quivering and has to lock them to stand. Did not have that episode this morning. No change in activities. Has been able to do his exercises. Pt usually sleeps on his R or L sides for years. Pt  has had little fleeting back pain but not lingering. Had back surgery in 1998 to T7/T8. Stood for about 45-50 minutes Saturday for an open house and [employer]. Had similar weakness and had to sit down. Did not feel like his energy is drained. Was fine except for his legs.  If pt sleeps too long, pt wakes up wiht a back ache. Walking helps with his back ache.  Denies loss of bowel or bladder control or LE paresthesia. The LE jitters last until the afternoon sometimes. No UE jitters. Pt states the longer he stands performing exercises, the weaker his legs feel. Better when he walked around.    Pertinent History  B LE weakness. Pt states that he does not usually use his rollator but does not know when he will feel out of breath.  Pt has a hx aortic valve replacement 2008. Also had another Sternotomy 12/01/2018. 12/09/2018 pt underwent TEVAR with a L subclavion artery and coratid artery graft.  Pt was able to ambulate with a walker 2 weeks after the surgeries. Pt also had a UTI after both surgeries. Pt was admitted. Pt was not allowed to walk for 1 week. Pt was also told that  pt was going to have neurological issues with his legs and feet but 2 months of PT should take care of that. Pt went home and pt had a crazy walk and felt like he was a "prancing horse. " Pt feels like he is raising his feet high without him being able to control. Had PT at [facility] after his surgery and did well but still had difficulty controlling how high he can lift his legs. Had the difficulty with controlling how high he can lift his legs after his UTI. His legs usually comes high in the middle of the night going to the bathroom (after being in bed for a few hours), as well after sitting for a while (a couple of hours) at times, kind of unpredictable. Pt sits on a chair lift. Pt also has a difficult time with swelling in his feet and ankles. Take lasix to help. Pt sits for a while keeping his feet elevated due to the B LE swelling. B  LE swelling may have started after his UTI. Pt also has a hx of 3 CVA (first 2 were TIAs 2012 and 2014), The third occurence was actually a full infarct/CVA in 2015 which decreased his endurance. Did not notice any weakness. Pt currently unable to play golf which is his leisure activity. Pt could not lift more than 5 lbs after his thoracic surgery in 11/2018. Has not seen Dr. Mali Hughes yet so does not know if his restrictions have been lifted yet. Pt furniture walks. [place] one time around December 15/16, 2020. Pt tried to break his fall and landed on his R side. Does not know if he moved anything in his chest surgery. Did not cal lhis doctor. Denies chest pain or difficulty breathing, lightheadedness or dizziness. Pt states that standing up from a regular chair when pt has to use his arms, pt feels like everything inside him feels like is drained, his legs quiver and has to sit back down. Pt sits down and does the pursed lip breathing, pt would recover. If pt however does not use his hands, he is fine. Pt also adds numb like feeling B legs.    Patient Stated Goals  Be able to walk normally and play basketball with grandsons like he used to before his surgery in October 2020.    Currently in Pain?  No/denies         [facility] PT Assessment - 05/09/19 1050      AROM   Lumbar Flexion  WFL with L posterior hip tightness    Lumbar Extension  WFL with L trunk rotation slightly    Lumbar - Right Side Bend  limited    Lumbar - Left Side Bend  limited     Lumbar - Right Rotation  WFL    Lumbar - Left Rotation  [facility]                           PT Education - 05/09/19 1107    Education Details  ther-ex, HEP    Person(s) Educated  Patient    Methods  Explanation;Demonstration;Tactile cues;Verbal cues;Handout    Comprehension  Returned demonstration;Verbalized understanding       Objective    MedbridgeAccess Code: [email or web address]   No pacemakers or defibrilators.   No latex  allergies    Blood pressure measurements R ARM ONLY  Check blood pressure  Surgical incisions healed.    Pt states that he called his  cardiac surgeon's office and was told that his only limitation is to not strain.     Therapeutic exercise  Back posture: slight L lateral shift  TTP L greater trochanter.   Lumbar flexion 6x. WFL, L posterior hip tightness  Lumbar extension 10x. Felt good  Standing L lateral shift correction 10x5 seconds for 3 sets  Reviewed and given as part of his HEP. Pt demonstrated and verbalized understanding. Handout provided.   Then standing lumbar flexion 10x. No L posterior hip tightnes but reproduced B thigh weakness and shakiness  Standing B scapular retraction with chin tuck 10x5 seconds for 3 sets to promote upper thoracic extension and gentle low back extension    Improved exercise technique, movement at target joints, use of target muscles after mod verbal, visual, tactile cues.       Response to treatment Pt tolerated session well without aggravation of symptoms.    Clinical impression Possible low back component with B LE symptoms especially with reproduction of symptoms with repeated flexion 20x observed as well as with prolonged standing reported by pt during the weekend. Worked on lateral shift correction and gentle extension to help address. Pt will benefit from continued skilled physical therapy services to improve strength, and function.     PT Short Term Goals - 04/13/19 1047      PT SHORT TERM GOAL #1   Title  Pt will be independent with his HEP to improve strength, balance, and function.    Baseline  Pt has started his HEP (02/16/2019); pt performing some of his HEP. No questions (03/31/2019), (04/13/2019)    Time  3    Period  Weeks    Status  Achieved    Target Date  03/10/19        PT Long Term Goals - 04/13/19 1048      PT LONG TERM GOAL #1   Title  Pt will improve B LE  strength by at least 1/2 MMT grade to promote ability to ambulate and perform functional tasks with less difficulty.    Time  8    Period  Weeks    Status  Achieved    Target Date  04/14/19      PT LONG TERM GOAL #2   Title  Patient will be able to ambulate without use of AD at least 500 ft to promote ability to return to PLOF.    Baseline  Currently ambulates longer distances with rollator (02/16/2019); able to ambulate > 500 ft independently. Pt reports being able to ambulate up to 1.25 miles without AD (04/13/2019)    Time  8    Period  Weeks    Status  Achieved    Target Date  04/14/19      PT LONG TERM GOAL #3   Title  Pt will improve FOTO score by at least 10 points as a demonstration of improved function.    Baseline  LE FOTO 56 (02/16/2019); 59 (03/31/2019); 61 (04/13/2019)    Time  4    Period  Weeks    Status  Partially Met    Target Date  05/12/19      PT LONG TERM GOAL #4   Title  Pt will have 5/5 B hip strength to promote ability to ambulate and perform functional tasks with less difficulty.    Time  4    Period  Weeks    Status  New    Target Date  05/12/19  PT LONG TERM GOAL #5   Title  Pt will be able to jog at least 3 min on even surface safely to promote ability to perform exercises to promote physical health.    Baseline  Currently unable to jog. (04/13/2019)    Time  4    Period  Weeks    Status  New    Target Date  05/12/19            Plan - 05/09/19 1033    Clinical Impression Statement  Possible low back component with B LE symptoms especially with reproduction of symptoms with repeated flexion 20x observed as well as with prolonged standing reported by pt during the weekend. Worked on lateral shift correction and gentle extension to help address. Pt will benefit from continued skilled physical therapy services to improve strength, and function.    Personal Factors and Comorbidities  Age;Comorbidity 3+    Comorbidities  S/P TEVER, B LE weakness, hx  of aortic valve replacement, COPD, CVA    Examination-Activity Limitations  Transfers;Bed Mobility;Lift;Squat;Stairs;Carry;Locomotion Level    Stability/Clinical Decision Making  Stable/Uncomplicated    Rehab Potential  Good    PT Frequency  2x / week    PT Duration  4 weeks    PT Treatment/Interventions  Therapeutic exercise;Balance training;Neuromuscular re-education;Patient/family education;Manual techniques;Dry needling;Aquatic Therapy;Advice worker;Therapeutic activities    PT Next Visit Plan  hip, knee, ankle, trunk strengthening, femoral control, gait training, manual techniques, modalities PRN    Consulted and Agree with Plan of Care  Patient       Patient will benefit from skilled therapeutic intervention in order to improve the following deficits and impairments:  Postural dysfunction, Improper body mechanics, Difficulty walking, Decreased strength, Decreased endurance, Decreased balance, Abnormal gait  Visit Diagnosis: Difficulty in walking, not elsewhere classified  Muscle weakness (generalized)     Problem List Patient Active Problem List   Diagnosis Date Noted  . Bilateral finger numbness 01/20/2019  . Headache 01/20/2019  . Night sweats 01/20/2019  . UTI (urinary tract infection) 01/20/2019  . Swelling of male genital structure 01/20/2019  . Educated about COVID-19 virus infection 01/05/2019  . Cardiomyopathy ([place]) 01/05/2019  . COVID-19 11/10/2018  . Fall 05/07/2018  . Abdominal pain 01/04/2018  . Costochondritis 01/04/2018  . Thyroid nodule 01/04/2018  . Dissecting aneurysm of thoracic aorta, [place] type B ([place]) 11/08/2017  . Anemia 11/08/2017  . Hyponatremia 11/08/2017  . Overweight 07/01/2017  . TIA (transient ischemic attack)   . Stroke ([place])   . GERD (gastroesophageal reflux disease)   . Arthritis   . Neck pain 12/01/2016  . Knee osteoarthritis 12/01/2016  . Physical deconditioning 06/24/2016  . Dyspnea on exertion  06/23/2016  . Chronic pain of right knee 12/18/2015  . Chronic obstructive pulmonary disease ([place]) 11/14/2014  . BPH (benign prostatic hyperplasia) 11/14/2014  . Preventative health care 11/14/2014  . Long term current use of anticoagulant therapy 10/29/2013  . Cerebral infarction ([place]) 06/02/2013  . H/O aortic valve replacement 12/26/2011  . History of aortic arch replacement 01/07/2011  . HTN (hypertension)   . Hyperlipidemia   . Aortic valve disease     Joneen Boers PT, DPT   05/09/2019, 3:03 PM  [facility] [address], [place], [address] Phone: [phone]   Fax:  [phone]  Name: Alexander Barton MRN: [id number] Date of Birth: 1950/07/28

## 2019-05-11 ENCOUNTER — Other Ambulatory Visit: Payer: Self-pay

## 2019-05-11 ENCOUNTER — Ambulatory Visit: Payer: Medicare PPO

## 2019-05-11 DIAGNOSIS — R262 Difficulty in walking, not elsewhere classified: Secondary | ICD-10-CM | POA: Diagnosis not present

## 2019-05-11 DIAGNOSIS — M6281 Muscle weakness (generalized): Secondary | ICD-10-CM | POA: Diagnosis not present

## 2019-05-11 NOTE — Therapy (Signed)
[facility] [address] [place], [address] Phone: [phone]   Fax:  [phone]  Physical Therapy Treatment and Physical Therapy Progress Note   Dates of reporting period  03/31/19  to   05/11/2019   Patient Details  Name: Alexander Barton MRN: [id number] Date of Birth: 12/06/1950 Referring Provider (PT): [name], MD    Encounter Date: 05/11/2019  PT End of Session - 05/11/19 0803    Visit Number  20    Number of Visits  25    Date for PT Re-Evaluation  05/12/19    Authorization Type  10    Authorization Time Period  of 10 progress report    Authorization - Visit Number  4    Authorization - Number of Visits  8    PT Start Time  0804    PT Stop Time  [id number]    PT Time Calculation (min)  40 min    Activity Tolerance  Patient tolerated treatment well    Behavior During Therapy  [facility] for tasks assessed/performed       Past Medical History:  Diagnosis Date  . Aortic aneurysm, thoracic ([place])   . Aortic valve disease   . Arthritis   . BPH (benign prostatic hyperplasia)   . COPD (chronic obstructive pulmonary disease) ([place])   . GERD (gastroesophageal reflux disease)   . HTN (hypertension)   . Hypercholesterolemia   . Stroke ([place])   . TIA (transient ischemic attack)     Past Surgical History:  Procedure Laterality Date  . ACHILLES TENDON REPAIR    . AORTIC VALVE REPLACEMENT     #23 On-X valve conduit  . ASCENDING AORTIC ANEURYSM REPAIR    . BACK SURGERY    . FOOT SURGERY      There were no vitals filed for this visit.  Subjective Assessment - 05/11/19 0801    Subjective  Patient reported that overall he has made a lot of progress with therapy, that he initially used a walker. He still thinks he still has difficulty with leg weakness and balance. Stated over the weekend he almost fell on the concrete but was able to catch himself.    Pertinent History  B LE weakness. Pt states that he  does not usually use his rollator but does not know when he will feel out of breath.  Pt has a hx aortic valve replacement 2008. Also had another Sternotomy 12/01/2018. 12/09/2018 pt underwent TEVAR with a L subclavion artery and coratid artery graft.  Pt was able to ambulate with a walker 2 weeks after the surgeries. Pt also had a UTI after both surgeries. Pt was admitted. Pt was not allowed to walk for 1 week. Pt was also told that pt was going to have neurological issues with his legs and feet but 2 months of PT should take care of that. Pt went home and pt had a crazy walk and felt like he was a "prancing horse. " Pt feels like he is raising his feet high without him being able to control. Had PT at [facility] after his surgery and did well but still had difficulty controlling how high he can lift his legs. Had the difficulty with controlling how high he can lift his legs after his UTI. His legs usually comes high in the middle of the night going to the bathroom (after being in bed for a few hours), as well after sitting for a  while (a couple of hours) at times, kind of unpredictable. Pt sits on a chair lift. Pt also has a difficult time with swelling in his feet and ankles. Take lasix to help. Pt sits for a while keeping his feet elevated due to the B LE swelling. B LE swelling may have started after his UTI. Pt also has a hx of 3 CVA (first 2 were TIAs 2012 and 2014), The third occurence was actually a full infarct/CVA in 2015 which decreased his endurance. Did not notice any weakness. Pt currently unable to play golf which is his leisure activity. Pt could not lift more than 5 lbs after his thoracic surgery in 11/2018. Has not seen Dr. Mali Hughes yet so does not know if his restrictions have been lifted yet. Pt furniture walks. [place] one time around December 15/16, 2020. Pt tried to break his fall and landed on his R side. Does not know if he moved anything in his chest surgery. Did not cal lhis doctor.  Denies chest pain or difficulty breathing, lightheadedness or dizziness. Pt states that standing up from a regular chair when pt has to use his arms, pt feels like everything inside him feels like is drained, his legs quiver and has to sit back down. Pt sits down and does the pursed lip breathing, pt would recover. If pt however does not use his hands, he is fine. Pt also adds numb like feeling B legs.    Patient Stated Goals  Be able to walk normally and play basketball with grandsons like he used to before his surgery in October 2020.    Currently in Pain?  No/denies       Objective     Medbridge Access Code: [email or web address]         No pacemakers or defibrilators.    No latex allergies   Blood pressure measurements R ARM ONLY   Check blood pressure  109/51, HR 80   MMT of Hip  Hip extension 4/5 bilaterally Hip abduction R 4/5 L 4-/5  Hip adduction 4/5 bilaterally Hip flexion 4+/5 bilaterally  DF 3+/5 in available range of R foot DF 4/5 in available range (L ROM greater than R ROM)   Has main complaints of tripping over his toes with time and fatigue.  Burning cramping sensation that's come and goes, lateral leg, below the knee   Surgical incisions healed.      Pt states that he called his cardiac surgeon's office and was told that his only limitation is to not strain.      Therapeutic exercise   Toe taps x10 seated for DF (added to HEP) Reviewed AAROM DF with strap for optimal muscle activation Lumbar extension in standing during symptoms/fatigue to see if it addresses the symptoms  Standing lumbar extension (gentle) with legs propped against table, instructed to perfom 10-15 reps when pt experiences LE symptoms, pt agreeable. FOTO: 67   6MWT: with time pt exhibited increased foot slap on R, unsteadiness,  decreased gait velocity, endorsed fatigue, more evident steppage gait:  1530ft, RPE of 8.5/10      Clinical impression/pt response:  Goals reassessed this session for  progress note. Overall the patient reported and has demonstrated progression (noteable in goals section). He does continue to exhibit deficits in hip strength, activity tolerance/endurance, and gait abnormalities. The patient would benefit from further physical therapy intervention to continue to assess/address deficits and limitations to maximize functional activities and safety, as well as further assessment of involvement of  lumbar spine.      PT Education - 05/11/19 0801    Education Details  therex-HEP    Person(s) Educated  Patient    Methods  Explanation;Demonstration;Tactile cues;Verbal cues;Handout    Comprehension  Verbalized understanding;Returned demonstration       PT Short Term Goals - 04/13/19 1047      PT SHORT TERM GOAL #1   Title  Pt will be independent with his HEP to improve strength, balance, and function.    Baseline  Pt has started his HEP (02/16/2019); pt performing some of his HEP. No questions (03/31/2019), (04/13/2019)    Time  3    Period  Weeks    Status  Achieved    Target Date  03/10/19        PT Long Term Goals - 05/11/19 0802      PT LONG TERM GOAL #1   Title  Pt will improve B LE strength by at least 1/2 MMT grade to promote ability to ambulate and perform functional tasks with less difficulty.    Time  8    Period  Weeks    Status  Achieved    Target Date  04/14/19      PT LONG TERM GOAL #2   Title  Patient will be able to ambulate without use of AD at least 500 ft to promote ability to return to PLOF.    Baseline  Currently ambulates longer distances with rollator (02/16/2019); able to ambulate > 500 ft independently. Pt reports being able to ambulate up to 1.25 miles without AD (04/13/2019)    Time  8    Period  Weeks    Status  Achieved    Target Date  04/14/19      PT LONG TERM GOAL #3   Title  Pt will improve FOTO score by at least 10 points as a demonstration of improved function.    Baseline  LE FOTO 56 (02/16/2019); 59 (03/31/2019); 61  (04/13/2019); 67 (05/11/2019)    Time  4    Period  Weeks    Status  Achieved    Target Date  05/11/19      PT LONG TERM GOAL #4   Title  Pt will have 5/5 B hip strength to promote ability to ambulate and perform functional tasks with less difficulty.    Baseline  see PN from 05/11/19, mild deficits in hip extension and hip abduction noted    Time  4    Period  Weeks    Status  On-going    Target Date  06/08/19      PT LONG TERM GOAL #5   Title  Pt will be able to jog at least 3 min on even surface safely to promote ability to perform exercises to promote physical health.    Baseline  Currently unable to jog. (04/13/2019); unable to jog    Time  4    Period  Weeks    Status  On-going    Target Date  06/08/19      Additional Long Term Goals   Additional Long Term Goals  Yes      PT LONG TERM GOAL #6   Title  The patient will perform 6 MWT >1229ft with RPE of 6 or less on a scale of 1/10, as well as maintained gait velocity and no instances of stumbling/imbalance.    Baseline  154ft. Pt exhibited increased foot slap and steppage gait over time, as well as increased  unsteadiness and decreased gait velocity. RPE of 1-10 pt reported 8.5/10. Reported one instance of L foot almost catching on the floor. No AFOs worn.    Time  4    Period  Weeks    Status  New    Target Date  06/08/19            Plan - 05/11/19 0802    Clinical Impression Statement  Overall the patient reported and has demonstrated progression (noteable in goals section). He does continue to exhibit deficits in hip strength, activity tolerance/endurance, and gait abnormalities. The patient would benefit from further physical therapy intervention to continue to assess/address deficits and limitations to maximize functional activities and safety, as well as further assessment of involvement of lumbar spine.    Personal Factors and Comorbidities  Age;Comorbidity 3+    Comorbidities  S/P TEVER, B LE weakness, hx of aortic  valve replacement, COPD, CVA    Examination-Activity Limitations  Transfers;Bed Mobility;Lift;Squat;Stairs;Carry;Locomotion Level    Stability/Clinical Decision Making  Stable/Uncomplicated    Rehab Potential  Good    PT Frequency  2x / week    PT Duration  4 weeks    PT Treatment/Interventions  Therapeutic exercise;Balance training;Neuromuscular re-education;Patient/family education;Manual techniques;Dry needling;Aquatic Therapy;Advice worker;Therapeutic activities    PT Next Visit Plan  hip, knee, ankle, trunk strengthening, femoral control, gait training, manual techniques, modalities PRN    Consulted and Agree with Plan of Care  Patient       Patient will benefit from skilled therapeutic intervention in order to improve the following deficits and impairments:  Postural dysfunction, Improper body mechanics, Difficulty walking, Decreased strength, Decreased endurance, Decreased balance, Abnormal gait  Visit Diagnosis: Difficulty in walking, not elsewhere classified  Muscle weakness (generalized)     Problem List Patient Active Problem List   Diagnosis Date Noted  . Bilateral finger numbness 01/20/2019  . Headache 01/20/2019  . Night sweats 01/20/2019  . UTI (urinary tract infection) 01/20/2019  . Swelling of male genital structure 01/20/2019  . Educated about COVID-19 virus infection 01/05/2019  . Cardiomyopathy ([place]) 01/05/2019  . COVID-19 11/10/2018  . Fall 05/07/2018  . Abdominal pain 01/04/2018  . Costochondritis 01/04/2018  . Thyroid nodule 01/04/2018  . Dissecting aneurysm of thoracic aorta, [place] type B ([place]) 11/08/2017  . Anemia 11/08/2017  . Hyponatremia 11/08/2017  . Overweight 07/01/2017  . TIA (transient ischemic attack)   . Stroke ([place])   . GERD (gastroesophageal reflux disease)   . Arthritis   . Neck pain 12/01/2016  . Knee osteoarthritis 12/01/2016  . Physical deconditioning 06/24/2016  . Dyspnea on exertion 06/23/2016  .  Chronic pain of right knee 12/18/2015  . Chronic obstructive pulmonary disease ([place]) 11/14/2014  . BPH (benign prostatic hyperplasia) 11/14/2014  . Preventative health care 11/14/2014  . Long term current use of anticoagulant therapy 10/29/2013  . Cerebral infarction ([place]) 06/02/2013  . H/O aortic valve replacement 12/26/2011  . History of aortic arch replacement 01/07/2011  . HTN (hypertension)   . Hyperlipidemia   . Aortic valve disease     Lieutenant Diego PT, DPT 11:26 AM,05/11/19   [facility] [address], [place], [address] Phone: [phone]   Fax:  [phone]  Name: Alexander Barton MRN: [id number] Date of Birth: [birth year]

## 2019-05-15 ENCOUNTER — Other Ambulatory Visit: Payer: Self-pay | Admitting: Cardiology

## 2019-05-23 ENCOUNTER — Ambulatory Visit: Payer: Self-pay | Admitting: Pharmacist Clinician (PhC)/ Clinical Pharmacy Specialist

## 2019-05-23 ENCOUNTER — Other Ambulatory Visit: Payer: Self-pay

## 2019-05-23 ENCOUNTER — Ambulatory Visit: Payer: Medicare PPO

## 2019-05-23 ENCOUNTER — Encounter (INDEPENDENT_AMBULATORY_CARE_PROVIDER_SITE_OTHER): Payer: Medicare PPO | Admitting: Cardiovascular Disease

## 2019-05-23 DIAGNOSIS — M6281 Muscle weakness (generalized): Secondary | ICD-10-CM | POA: Diagnosis not present

## 2019-05-23 DIAGNOSIS — I359 Nonrheumatic aortic valve disorder, unspecified: Secondary | ICD-10-CM

## 2019-05-23 DIAGNOSIS — R262 Difficulty in walking, not elsewhere classified: Secondary | ICD-10-CM | POA: Diagnosis not present

## 2019-05-23 DIAGNOSIS — Z952 Presence of prosthetic heart valve: Secondary | ICD-10-CM

## 2019-05-23 DIAGNOSIS — I639 Cerebral infarction, unspecified: Secondary | ICD-10-CM

## 2019-05-23 DIAGNOSIS — Z7901 Long term (current) use of anticoagulants: Secondary | ICD-10-CM

## 2019-05-23 LAB — POCT INR
INR: 2.2 (ref 2.0–3.0)
INR: 2.2 (ref 2.0–3.0)

## 2019-05-23 NOTE — Therapy (Signed)
[facility] [address] [place], [address] Phone: [phone]   Fax:  [phone]  Physical Therapy Treatment  Patient Details  Name: Alexander Barton MRN: [id number] Date of Birth: September 29, 1950 Referring Provider (PT): [name], MD    Encounter Date: 05/23/2019  PT End of Session - 05/23/19 0805    Visit Number  21    Number of Visits  37    Date for PT Re-Evaluation  07/07/19    Authorization Type  1    Authorization Time Period  of 10 progress report    Authorization - Visit Number  4    Authorization - Number of Visits  8    PT Start Time  0806    PT Stop Time  0847    PT Time Calculation (min)  41 min    Activity Tolerance  Patient tolerated treatment well    Behavior During Therapy  [facility] for tasks assessed/performed       Past Medical History:  Diagnosis Date  . Aortic aneurysm, thoracic ([place])   . Aortic valve disease   . Arthritis   . BPH (benign prostatic hyperplasia)   . COPD (chronic obstructive pulmonary disease) ([place])   . GERD (gastroesophageal reflux disease)   . HTN (hypertension)   . Hypercholesterolemia   . Stroke ([place])   . TIA (transient ischemic attack)     Past Surgical History:  Procedure Laterality Date  . ACHILLES TENDON REPAIR    . AORTIC VALVE REPLACEMENT     #23 On-X valve conduit  . ASCENDING AORTIC ANEURYSM REPAIR    . BACK SURGERY    . FOOT SURGERY      There were no vitals filed for this visit.  Subjective Assessment - 05/23/19 0807    Subjective  Walking and balance is a little better. No pain at the moment. Leaving for [place], then [employer] in Jun 23, 2019 for his Anniversary. R knee is a little better.  Wants to continue until at least 06/03/2019. Feels like PT has helped him immensly.  Has not felt posterior hip symptoms doing exercises or walking.    Pertinent History  B LE weakness. Pt states that he does not usually use his rollator but  does not know when he will feel out of breath.  Pt has a hx aortic valve replacement 2008. Also had another Sternotomy 12/01/2018. 12/09/2018 pt underwent TEVAR with a L subclavion artery and coratid artery graft.  Pt was able to ambulate with a walker 2 weeks after the surgeries. Pt also had a UTI after both surgeries. Pt was admitted. Pt was not allowed to walk for 1 week. Pt was also told that pt was going to have neurological issues with his legs and feet but 2 months of PT should take care of that. Pt went home and pt had a crazy walk and felt like he was a "prancing horse. " Pt feels like he is raising his feet high without him being able to control. Had PT at [facility] after his surgery and did well but still had difficulty controlling how high he can lift his legs. Had the difficulty with controlling how high he can lift his legs after his UTI. His legs usually comes high in the middle of the night going to the bathroom (after being in bed for a few hours), as well after sitting for a while (a couple of hours) at times, kind of  unpredictable. Pt sits on a chair lift. Pt also has a difficult time with swelling in his feet and ankles. Take lasix to help. Pt sits for a while keeping his feet elevated due to the B LE swelling. B LE swelling may have started after his UTI. Pt also has a hx of 3 CVA (first 2 were TIAs 2012 and 2014), The third occurence was actually a full infarct/CVA in 2015 which decreased his endurance. Did not notice any weakness. Pt currently unable to play golf which is his leisure activity. Pt could not lift more than 5 lbs after his thoracic surgery in 11/2018. Has not seen Dr. Mali Hughes yet so does not know if his restrictions have been lifted yet. Pt furniture walks. [place] one time around December 15/16, 2020. Pt tried to break his fall and landed on his R side. Does not know if he moved anything in his chest surgery. Did not cal lhis doctor. Denies chest pain or difficulty breathing,  lightheadedness or dizziness. Pt states that standing up from a regular chair when pt has to use his arms, pt feels like everything inside him feels like is drained, his legs quiver and has to sit back down. Pt sits down and does the pursed lip breathing, pt would recover. If pt however does not use his hands, he is fine. Pt also adds numb like feeling B legs.    Patient Stated Goals  Be able to walk normally and play basketball with grandsons like he used to before his surgery in October 2020.    Currently in Pain?  No/denies                               PT Education - 05/23/19 0825    Education Details  ther-ex    Person(s) Educated  Patient    Methods  Explanation;Demonstration;Tactile cues;Verbal cues    Comprehension  Returned demonstration;Verbalized understanding      Objective    MedbridgeAccess Code: [email or web address]   No pacemakers or defibrilators.   No latex allergies    Blood pressure measurements R ARM ONLY  Check blood pressure if appropriate  Surgical incisions healed.    Pt states that he called his cardiac surgeon's office and was told that his only limitation is to not strain.   MMT of Hip performed on:  05/11/2019   Hip extension 4/5 bilaterally Hip abduction R 4/5 L 4-/5  Hip adduction 4/5 bilaterally Hip flexion 4+/5 bilaterally  DF 3+/5 in available range of R foot DF 4/5 in available range (L ROM greater than R ROM)     Therapeutic exercise  Reviewed plan of care: continue to2x/week for 6 weeks Seated PT assisted ankle DF with PT to end range  R 10x5 seconds for 2 sets  L 10x5 seconds for 2 sets   standing static jog x 20 seconds,   Then not static 30 seconds (32 ft x 2) Therapeutic rest breaks secondary to fatigue to let body recover.              Pt states R knee did not want to give way but not as strong as the L.   Jogging 100 ft (24 seconds).  Therapeutic rest break afterwards  secodary to fatigue. Pt states the jog felt good.   Ergonomic lifts to protect back  Picking up cone from floor 5x,  Then placing it onto the chair to the L 2x,  emphasis on no twisting, but to step to turn around.   Standing B scapular retraction with chin tuck 10x5 seconds to promote upper thoracic extension and gentle low back extension      Improved exercise technique, movement at target joints, use of target muscles after min to mod verbal, visual, tactile cues.       Response to treatment Pt tolerated session well without aggravation of symptoms.    Clinical impression Improved jog endurance by at least 20 seconds (started off at 10 seconds) with pt being able to jog safely for up to 30 seconds today. Rest break afterwards secondary to fatigue. Improved bilateral ankle DF strength. Pt aslo demonstrates overall improved function as well based on improved FOTO score. Pt still demonstrates B LE weakness, fatigue, limited ankle DF which makes jogging and standing activities difficult and would benefit from contineud skilled physical therapy services to address the aforementioned deficits.          PT Short Term Goals - 04/13/19 1047      PT SHORT TERM GOAL #1   Title  Pt will be independent with his HEP to improve strength, balance, and function.    Baseline  Pt has started his HEP (02/16/2019); pt performing some of his HEP. No questions (03/31/2019), (04/13/2019)    Time  3    Period  Weeks    Status  Achieved    Target Date  03/10/19        PT Long Term Goals - 05/23/19 0955      PT LONG TERM GOAL #1   Title  Pt will improve B LE strength by at least 1/2 MMT grade to promote ability to ambulate and perform functional tasks with less difficulty.    Time  8    Period  Weeks    Status  Achieved      PT LONG TERM GOAL #2   Title  Patient will be able to ambulate without use of AD at least 500 ft to promote ability to return to PLOF.    Baseline   Currently ambulates longer distances with rollator (02/16/2019); able to ambulate > 500 ft independently. Pt reports being able to ambulate up to 1.25 miles without AD (04/13/2019)    Time  8    Period  Weeks    Status  Achieved      PT LONG TERM GOAL #3   Title  Pt will improve FOTO score by at least 10 points as a demonstration of improved function.    Baseline  LE FOTO 56 (02/16/2019); 59 (03/31/2019); 61 (04/13/2019); 67 (05/11/2019)    Time  4    Period  Weeks    Status  Achieved      PT LONG TERM GOAL #4   Title  Pt will have 5/5 B hip strength to promote ability to ambulate and perform functional tasks with less difficulty.    Baseline  see PN from 05/11/19, mild deficits in hip extension and hip abduction noted    Time  6    Period  Weeks    Status  On-going    Target Date  07/07/19      PT LONG TERM GOAL #5   Title  Pt will be able to jog at least 3 min on even surface safely to promote ability to perform exercises to promote physical health.    Baseline  Currently unable to jog. (04/13/2019); unable to jog; able to jog 100 ft, and up  to 30 seconds (05/23/2019)    Time  6    Period  Weeks    Status  On-going    Target Date  07/07/19      PT LONG TERM GOAL #6   Title  The patient will perform 6 MWT >1248ft with RPE of 6 or less on a scale of 1/10, as well as maintained gait velocity and no instances of stumbling/imbalance.    Baseline  1570ft. Pt exhibited increased foot slap and steppage gait over time, as well as increased unsteadiness and decreased gait velocity. RPE of 1-10 pt reported 8.5/10. Reported one instance of L foot almost catching on the floor. No AFOs worn.    Time  6    Period  Weeks    Status  On-going    Target Date  07/07/19            Plan - 05/23/19 0831    Clinical Impression Statement  Improved jog endurance by at least 20 seconds (started off at 10 seconds) with pt being able to jog safely for up to 30 seconds today. Rest break afterwards secondary to  fatigue. Improved bilateral ankle DF strength. Pt aslo demonstrates overall improved function as well based on improved FOTO score. Pt still demonstrates B LE weakness, fatigue, limited ankle DF which makes jogging and standing activities difficult and would benefit from contineud skilled physical therapy services to address the aforementioned deficits.    Personal Factors and Comorbidities  Age;Comorbidity 3+    Comorbidities  S/P TEVER, B LE weakness, hx of aortic valve replacement, COPD, CVA    Examination-Activity Limitations  Transfers;Bed Mobility;Lift;Squat;Stairs;Carry;Locomotion Level    Stability/Clinical Decision Making  Stable/Uncomplicated    Clinical Decision Making  Low    Rehab Potential  Good    PT Frequency  2x / week    PT Duration  6 weeks    PT Treatment/Interventions  Therapeutic exercise;Balance training;Neuromuscular re-education;Patient/family education;Manual techniques;Dry needling;Aquatic Therapy;Advice worker;Therapeutic activities    PT Next Visit Plan  hip, knee, ankle, trunk strengthening, femoral control, gait training, manual techniques, modalities PRN    Consulted and Agree with Plan of Care  Patient       Patient will benefit from skilled therapeutic intervention in order to improve the following deficits and impairments:  Postural dysfunction, Improper body mechanics, Difficulty walking, Decreased strength, Decreased endurance, Decreased balance, Abnormal gait  Visit Diagnosis: Difficulty in walking, not elsewhere classified - Plan: PT plan of care cert/re-cert  Muscle weakness (generalized) - Plan: PT plan of care cert/re-cert     Problem List Patient Active Problem List   Diagnosis Date Noted  . Bilateral finger numbness 01/20/2019  . Headache 01/20/2019  . Night sweats 01/20/2019  . UTI (urinary tract infection) 01/20/2019  . Swelling of male genital structure 01/20/2019  . Educated about COVID-19 virus infection  01/05/2019  . Cardiomyopathy ([place]) 01/05/2019  . COVID-19 11/10/2018  . Fall 05/07/2018  . Abdominal pain 01/04/2018  . Costochondritis 01/04/2018  . Thyroid nodule 01/04/2018  . Dissecting aneurysm of thoracic aorta, [place] type B ([place]) 11/08/2017  . Anemia 11/08/2017  . Hyponatremia 11/08/2017  . Overweight 07/01/2017  . TIA (transient ischemic attack)   . Stroke ([place])   . GERD (gastroesophageal reflux disease)   . Arthritis   . Neck pain 12/01/2016  . Knee osteoarthritis 12/01/2016  . Physical deconditioning 06/24/2016  . Dyspnea on exertion 06/23/2016  . Chronic pain of right knee 12/18/2015  . Chronic obstructive pulmonary disease (  [place]) 11/14/2014  . BPH (benign prostatic hyperplasia) 11/14/2014  . Preventative health care 11/14/2014  . Long term current use of anticoagulant therapy 10/29/2013  . Cerebral infarction ([place]) 06/02/2013  . H/O aortic valve replacement 12/26/2011  . History of aortic arch replacement 01/07/2011  . HTN (hypertension)   . Hyperlipidemia   . Aortic valve disease     Joneen Boers PT, DPT   05/23/2019, 10:02 AM  [facility] [address] [place], [address] Phone: [phone]   Fax:  [phone]  Name: HAKEEN PULEIO MRN: [id number] Date of Birth: 10/08/1950

## 2019-05-23 NOTE — Progress Notes (Signed)
This encounter was created in error - please disregard.

## 2019-05-25 ENCOUNTER — Ambulatory Visit: Payer: Medicare PPO | Admitting: Family Medicine

## 2019-05-25 ENCOUNTER — Ambulatory Visit: Payer: Medicare PPO

## 2019-05-25 ENCOUNTER — Other Ambulatory Visit: Payer: Self-pay

## 2019-05-25 ENCOUNTER — Encounter: Payer: Self-pay | Admitting: Family Medicine

## 2019-05-25 DIAGNOSIS — R109 Unspecified abdominal pain: Secondary | ICD-10-CM | POA: Diagnosis not present

## 2019-05-25 LAB — COMPREHENSIVE METABOLIC PANEL
ALT: 10 U/L (ref 0–53)
AST: 17 U/L (ref 0–37)
Albumin: 3.9 g/dL (ref 3.5–5.2)
Alkaline Phosphatase: 74 U/L (ref 39–117)
BUN: 17 mg/dL (ref 6–23)
CO2: 27 mEq/L (ref 19–32)
Calcium: 9.1 mg/dL (ref 8.4–10.5)
Chloride: 98 mEq/L (ref 96–112)
Creatinine, Ser: 1.1 mg/dL (ref 0.40–1.50)
GFR: 66.05 mL/min (ref >60.00)
Glucose, Bld: 184 mg/dL — ABNORMAL HIGH (ref 70–99)
Potassium: 3.9 mEq/L (ref 3.5–5.1)
Sodium: 133 mEq/L — ABNORMAL LOW (ref 135–145)
Total Bilirubin: 0.9 mg/dL (ref 0.2–1.2)
Total Protein: 7.5 g/dL (ref 6.0–8.3)

## 2019-05-25 LAB — LIPASE: Lipase: 59 U/L (ref 11.0–59.0)

## 2019-05-25 MED ORDER — PREDNISONE 50 MG PO TABS: ORAL_TABLET | ORAL | 0 refills | Status: DC

## 2019-05-25 NOTE — Progress Notes (Signed)
[name], MD Phone: [phone]  Alexander Barton is a 69 y.o. male who presents today for same day visit.   Abdominal pain: Patient notes this has been going on for about a month now.  Started in the left upper quadrant though then moved to the right upper quadrant and then the right side of his abdomen.  Notes it is an awful severe pain.  Comes and goes.  There are times where he has no pain.  Can last up to 6 hours when he gets it.  On occasion diet has triggered it though no other significant exacerbating factors.  No vomiting or diarrhea.  Some nausea.  He has a bowel movement twice daily.  No blood in stool.  No dysuria.  No hematuria.  He takes MiraLAX and Senokot daily.  He had to take tramadol x2 to help with his pain.  Tylenol did not help.  He does have a contrast allergy.  He has a history of a thoracic aortic dissection.  Social History   Tobacco Use  Smoking Status Never Smoker  Smokeless Tobacco Never Used     ROS see history of present illness  Objective  Physical Exam Vitals:   05/25/19 1127  BP: (!) 121/58  Pulse: 97  Temp: (!) 96.4 F (35.8 C)  SpO2: 97%    BP Readings from Last 3 Encounters:  05/25/19 (!) 121/58  03/14/19 131/78  01/10/19 131/68   Wt Readings from Last 3 Encounters:  05/25/19 211 lb (95.7 kg)  03/14/19 211 lb 6.4 oz (95.9 kg)  01/19/19 195 lb (88.5 kg)    Physical Exam Constitutional:      General: He is not in acute distress.    Appearance: He is not diaphoretic.  Cardiovascular:     Rate and Rhythm: Normal rate and regular rhythm.     Heart sounds: Normal heart sounds.  Pulmonary:     Effort: Pulmonary effort is normal.     Breath sounds: Normal breath sounds.  Abdominal:     General: Bowel sounds are normal. There is no distension.     Palpations: Abdomen is soft. There is no mass.     Tenderness: There is abdominal tenderness (Tenderness in left upper quadrant, right upper quadrant, and right lower quadrant  intermittently on exam). There is no guarding or rebound.  Skin:    General: Skin is warm and dry.  Neurological:     Mental Status: He is alert.      Assessment/Plan: Please see individual problem list.  Abdominal pain Undetermined cause.  Ongoing intermittent severe abdominal pain with tenderness on exam.  No specific pain at this time.  Discussed obtaining lab work and a CT scan.  He does have an iodinated contrast allergy and needs premedication with prednisone.  We will get the CT scan scheduled for 05/26/2019.  He was given premedication instructions with prednisone.  He has taken prednisone previously and it was beneficial for his allergy of sneezing.  Discussed that if he develops recurrent severe abdominal pain he should go to the emergency department.   Orders Placed This Encounter  Procedures  . CT Abdomen Pelvis W Contrast    Standing Status:   Future    Standing Expiration Date:   08/23/2020    Order Specific Question:   ** REASON FOR EXAM (FREE TEXT)    Answer:   Abdominal pain starting in LUQ that has moved to RUQ and then RLQ, occurs intermittently    Order Specific  Question:   If indicated for the ordered procedure, I authorize the administration of contrast media per Radiology protocol    Answer:   Yes    Order Specific Question:   Preferred imaging location?    Answer:   [facility]    Order Specific Question:   Is Oral Contrast requested for this exam?    Answer:   Yes, Per Radiology protocol    Order Specific Question:   Radiology Contrast Protocol - do NOT remove file path    Answer:   [email or web address]  . Comp Met (CMET)  . CBC w/Diff  . Lipase    Meds ordered this encounter  Medications  . predniSONE (DELTASONE) 50 MG tablet    Sig: Take 1 tablet (50 mg) by mouth 13 hours prior to your scan, then take 1 tablet (50 mg) by mouth 7 hours prior to scan, then take 1 tablet (50 mg) by mouth 1 hour prior to your scan    Dispense:   3 tablet    Refill:  0    This visit occurred during the SARS-CoV-2 public health emergency.  Safety protocols were in place, including screening questions prior to the visit, additional usage of staff PPE, and extensive cleaning of exam room while observing appropriate contact time as indicated for disinfecting solutions.    [name], MD [place]

## 2019-05-25 NOTE — Patient Instructions (Signed)
Nice to see you. We are going to get labs today. We will get a CT scan scheduled. If you develop severe abdominal pain again please seek medical attention in the emergency department. Please take the prednisone as prescribed prior to your CT scan.

## 2019-05-25 NOTE — Assessment & Plan Note (Addendum)
Undetermined cause.  Ongoing intermittent severe abdominal pain with tenderness on exam.  No specific pain at this time.  Discussed obtaining lab work and a CT scan.  He does have an iodinated contrast allergy and needs premedication with prednisone.  We will get the CT scan scheduled for 05/26/2019.  He was given premedication instructions with prednisone.  He has taken prednisone previously and it was beneficial for his allergy of sneezing.  Discussed that if he develops recurrent severe abdominal pain he should go to the emergency department.

## 2019-05-26 ENCOUNTER — Other Ambulatory Visit: Payer: Self-pay | Admitting: Family Medicine

## 2019-05-26 ENCOUNTER — Other Ambulatory Visit: Payer: Self-pay

## 2019-05-26 ENCOUNTER — Ambulatory Visit
Admission: RE | Admit: 2019-05-26 | Discharge: 2019-05-26 | Disposition: A | Discharge status: Home / Self Care | Payer: Medicare PPO | Source: Ambulatory Visit | Attending: Family Medicine | Admitting: Family Medicine

## 2019-05-26 DIAGNOSIS — K8020 Calculus of gallbladder without cholecystitis without obstruction: Secondary | ICD-10-CM | POA: Diagnosis not present

## 2019-05-26 DIAGNOSIS — K869 Disease of pancreas, unspecified: Secondary | ICD-10-CM

## 2019-05-26 DIAGNOSIS — R109 Unspecified abdominal pain: Secondary | ICD-10-CM | POA: Insufficient documentation

## 2019-05-26 LAB — CBC WITH DIFFERENTIAL/PLATELET
Basophils Absolute: 0.1 10*3/uL (ref 0.0–0.1)
Basophils Relative: 0.9 % (ref 0.0–3.0)
Eosinophils Absolute: 0.1 10*3/uL (ref 0.0–0.7)
Eosinophils Relative: 1.4 % (ref 0.0–5.0)
HCT: 28.2 % — ABNORMAL LOW (ref 39.0–52.0)
Hemoglobin: 9.6 g/dL — ABNORMAL LOW (ref 13.0–17.0)
Lymphocytes Relative: 17.9 % (ref 12.0–46.0)
Lymphs Abs: 1 10*3/uL (ref 0.7–4.0)
MCHC: 34.1 g/dL (ref 30.0–36.0)
MCV: 86.5 fl (ref 78.0–100.0)
Monocytes Absolute: 0.5 10*3/uL (ref 0.1–1.0)
Monocytes Relative: 9.4 % (ref 3.0–12.0)
Neutro Abs: 4.1 10*3/uL (ref 1.4–7.7)
Neutrophils Relative %: 70.4 % (ref 43.0–77.0)
Platelets: 248 10*3/uL (ref 150.0–400.0)
RBC: 3.26 Mil/uL — ABNORMAL LOW (ref 4.22–5.81)
RDW: 15.5 % (ref 11.5–15.5)
WBC: 5.9 10*3/uL (ref 4.0–10.5)

## 2019-05-27 ENCOUNTER — Other Ambulatory Visit: Payer: Self-pay | Admitting: Family Medicine

## 2019-05-27 ENCOUNTER — Telehealth: Payer: Self-pay

## 2019-05-27 ENCOUNTER — Other Ambulatory Visit: Payer: Self-pay

## 2019-05-27 ENCOUNTER — Other Ambulatory Visit (INDEPENDENT_AMBULATORY_CARE_PROVIDER_SITE_OTHER): Payer: Medicare PPO

## 2019-05-27 DIAGNOSIS — D649 Anemia, unspecified: Secondary | ICD-10-CM

## 2019-05-27 DIAGNOSIS — K869 Disease of pancreas, unspecified: Secondary | ICD-10-CM | POA: Diagnosis not present

## 2019-05-27 NOTE — Telephone Encounter (Signed)
Patient will come in and see Alonza Bogus, PA on 05/31/19     Milus Banister, MD sent to [place], Telford Nab., MD; Gatha Mayer, MD; Marlon Pel, RN; Jackquline Denmark, MD  Agree with better imaging first. Pretty unusual lesion.   Keep [place] in the loop.   DJ       Previous Messages   ----- Message -----  From: Irving Copas., MD  Sent: 05/27/2019  8:57 AM EDT  To: Marlon Pel, RN, Milus Banister, MD, *   CG,  Agree with likely EUS needs, however, I would favor with the intimacy of the lesion to the spleen, probably getting the MRI first then we decide on how we go after this lesion with EGD/EUS with FNB.  Let us know how the appointment goes and then DJ and I can coordinate some things as we figure out the anticoagulation question also.  Thanks.  GM  ----- Message -----  From: Gatha Mayer, MD  Sent: 05/27/2019  8:11 AM EDT  To: Marlon Pel, RN, Milus Banister, MD, *   [employer],

## 2019-05-28 LAB — IRON, TOTAL/TOTAL IRON BINDING CAP
%SAT: 5 % (calc) — ABNORMAL LOW (ref 20–48)
Iron: 13 ug/dL — ABNORMAL LOW (ref 50–180)
TIBC: 251 mcg/dL (calc) (ref 250–425)

## 2019-05-29 ENCOUNTER — Encounter (HOSPITAL_COMMUNITY): Payer: Self-pay

## 2019-05-29 ENCOUNTER — Other Ambulatory Visit: Payer: Self-pay

## 2019-05-29 ENCOUNTER — Emergency Department (HOSPITAL_COMMUNITY): Payer: Medicare PPO

## 2019-05-29 ENCOUNTER — Inpatient Hospital Stay (HOSPITAL_COMMUNITY)
Admission: EM | Admit: 2019-05-29 | Discharge: 2019-06-05 | DRG: 444 | Disposition: A | Discharge status: Home / Self Care | Payer: Medicare PPO | Attending: Internal Medicine | Admitting: Internal Medicine

## 2019-05-29 DIAGNOSIS — K863 Pseudocyst of pancreas: Secondary | ICD-10-CM

## 2019-05-29 DIAGNOSIS — K819 Cholecystitis, unspecified: Secondary | ICD-10-CM | POA: Diagnosis not present

## 2019-05-29 DIAGNOSIS — E871 Hypo-osmolality and hyponatremia: Secondary | ICD-10-CM | POA: Diagnosis not present

## 2019-05-29 DIAGNOSIS — Z8673 Personal history of transient ischemic attack (TIA), and cerebral infarction without residual deficits: Secondary | ICD-10-CM | POA: Diagnosis not present

## 2019-05-29 DIAGNOSIS — R1011 Right upper quadrant pain: Secondary | ICD-10-CM

## 2019-05-29 DIAGNOSIS — R109 Unspecified abdominal pain: Secondary | ICD-10-CM | POA: Diagnosis present

## 2019-05-29 DIAGNOSIS — J449 Chronic obstructive pulmonary disease, unspecified: Secondary | ICD-10-CM | POA: Diagnosis present

## 2019-05-29 DIAGNOSIS — Z91041 Radiographic dye allergy status: Secondary | ICD-10-CM

## 2019-05-29 DIAGNOSIS — I7102 Dissection of abdominal aorta: Secondary | ICD-10-CM | POA: Diagnosis present

## 2019-05-29 DIAGNOSIS — E785 Hyperlipidemia, unspecified: Secondary | ICD-10-CM | POA: Diagnosis present

## 2019-05-29 DIAGNOSIS — R61 Generalized hyperhidrosis: Secondary | ICD-10-CM | POA: Diagnosis present

## 2019-05-29 DIAGNOSIS — K859 Acute pancreatitis without necrosis or infection, unspecified: Secondary | ICD-10-CM | POA: Diagnosis present

## 2019-05-29 DIAGNOSIS — K862 Cyst of pancreas: Secondary | ICD-10-CM | POA: Diagnosis present

## 2019-05-29 DIAGNOSIS — N400 Enlarged prostate without lower urinary tract symptoms: Secondary | ICD-10-CM | POA: Diagnosis present

## 2019-05-29 DIAGNOSIS — I10 Essential (primary) hypertension: Secondary | ICD-10-CM | POA: Diagnosis present

## 2019-05-29 DIAGNOSIS — Z833 Family history of diabetes mellitus: Secondary | ICD-10-CM

## 2019-05-29 DIAGNOSIS — D490 Neoplasm of unspecified behavior of digestive system: Secondary | ICD-10-CM | POA: Diagnosis not present

## 2019-05-29 DIAGNOSIS — K8689 Other specified diseases of pancreas: Secondary | ICD-10-CM | POA: Diagnosis present

## 2019-05-29 DIAGNOSIS — J9811 Atelectasis: Secondary | ICD-10-CM | POA: Diagnosis not present

## 2019-05-29 DIAGNOSIS — R739 Hyperglycemia, unspecified: Secondary | ICD-10-CM | POA: Diagnosis present

## 2019-05-29 DIAGNOSIS — K828 Other specified diseases of gallbladder: Secondary | ICD-10-CM | POA: Diagnosis present

## 2019-05-29 DIAGNOSIS — R58 Hemorrhage, not elsewhere classified: Secondary | ICD-10-CM | POA: Diagnosis present

## 2019-05-29 DIAGNOSIS — Z20822 Contact with and (suspected) exposure to covid-19: Secondary | ICD-10-CM | POA: Diagnosis present

## 2019-05-29 DIAGNOSIS — Z885 Allergy status to narcotic agent status: Secondary | ICD-10-CM

## 2019-05-29 DIAGNOSIS — K219 Gastro-esophageal reflux disease without esophagitis: Secondary | ICD-10-CM | POA: Diagnosis present

## 2019-05-29 DIAGNOSIS — Z8249 Family history of ischemic heart disease and other diseases of the circulatory system: Secondary | ICD-10-CM | POA: Diagnosis not present

## 2019-05-29 DIAGNOSIS — Z7901 Long term (current) use of anticoagulants: Secondary | ICD-10-CM

## 2019-05-29 DIAGNOSIS — R112 Nausea with vomiting, unspecified: Secondary | ICD-10-CM | POA: Diagnosis present

## 2019-05-29 DIAGNOSIS — E1165 Type 2 diabetes mellitus with hyperglycemia: Secondary | ICD-10-CM | POA: Diagnosis present

## 2019-05-29 DIAGNOSIS — Z79899 Other long term (current) drug therapy: Secondary | ICD-10-CM

## 2019-05-29 DIAGNOSIS — R197 Diarrhea, unspecified: Secondary | ICD-10-CM | POA: Diagnosis present

## 2019-05-29 DIAGNOSIS — K8010 Calculus of gallbladder with chronic cholecystitis without obstruction: Principal | ICD-10-CM | POA: Diagnosis present

## 2019-05-29 DIAGNOSIS — Z7951 Long term (current) use of inhaled steroids: Secondary | ICD-10-CM

## 2019-05-29 DIAGNOSIS — E7800 Pure hypercholesterolemia, unspecified: Secondary | ICD-10-CM | POA: Diagnosis present

## 2019-05-29 DIAGNOSIS — Z03818 Encounter for observation for suspected exposure to other biological agents ruled out: Secondary | ICD-10-CM | POA: Diagnosis not present

## 2019-05-29 DIAGNOSIS — R0602 Shortness of breath: Secondary | ICD-10-CM

## 2019-05-29 DIAGNOSIS — Z7982 Long term (current) use of aspirin: Secondary | ICD-10-CM

## 2019-05-29 DIAGNOSIS — Z7952 Long term (current) use of systemic steroids: Secondary | ICD-10-CM

## 2019-05-29 DIAGNOSIS — I359 Nonrheumatic aortic valve disorder, unspecified: Secondary | ICD-10-CM | POA: Diagnosis present

## 2019-05-29 DIAGNOSIS — Z888 Allergy status to other drugs, medicaments and biological substances status: Secondary | ICD-10-CM

## 2019-05-29 DIAGNOSIS — N281 Cyst of kidney, acquired: Secondary | ICD-10-CM | POA: Diagnosis not present

## 2019-05-29 DIAGNOSIS — K8020 Calculus of gallbladder without cholecystitis without obstruction: Secondary | ICD-10-CM | POA: Diagnosis not present

## 2019-05-29 DIAGNOSIS — R040 Epistaxis: Secondary | ICD-10-CM | POA: Diagnosis not present

## 2019-05-29 DIAGNOSIS — Z952 Presence of prosthetic heart valve: Secondary | ICD-10-CM

## 2019-05-29 DIAGNOSIS — D509 Iron deficiency anemia, unspecified: Secondary | ICD-10-CM | POA: Diagnosis present

## 2019-05-29 DIAGNOSIS — Z8616 Personal history of COVID-19: Secondary | ICD-10-CM | POA: Diagnosis not present

## 2019-05-29 LAB — COMPREHENSIVE METABOLIC PANEL
ALT: 14 U/L (ref 0–44)
AST: 19 U/L (ref 15–41)
Albumin: 3 g/dL — ABNORMAL LOW (ref 3.5–5.0)
Alkaline Phosphatase: 71 U/L (ref 38–126)
Anion gap: 12 (ref 5–15)
BUN: 20 mg/dL (ref 8–23)
CO2: 24 mmol/L (ref 22–32)
Calcium: 9.2 mg/dL (ref 8.9–10.3)
Chloride: 97 mmol/L — ABNORMAL LOW (ref 98–111)
Creatinine, Ser: 1.3 mg/dL — ABNORMAL HIGH (ref 0.61–1.24)
GFR calc Af Amer: >60 mL/min (ref >60)
GFR calc non Af Amer: 55 mL/min — ABNORMAL LOW (ref >60)
Glucose, Bld: 241 mg/dL — ABNORMAL HIGH (ref 70–99)
Potassium: 4.9 mmol/L (ref 3.5–5.1)
Sodium: 133 mmol/L — ABNORMAL LOW (ref 135–145)
Total Bilirubin: 1.7 mg/dL — ABNORMAL HIGH (ref 0.3–1.2)
Total Protein: 7.5 g/dL (ref 6.5–8.1)

## 2019-05-29 LAB — CBC
HCT: 31.1 % — ABNORMAL LOW (ref 39.0–52.0)
Hemoglobin: 10 g/dL — ABNORMAL LOW (ref 13.0–17.0)
MCH: 28.2 pg (ref 26.0–34.0)
MCHC: 32.2 g/dL (ref 30.0–36.0)
MCV: 87.9 fL (ref 80.0–100.0)
Platelets: 347 10*3/uL (ref 150–400)
RBC: 3.54 MIL/uL — ABNORMAL LOW (ref 4.22–5.81)
RDW: 14.6 % (ref 11.5–15.5)
WBC: 12.9 10*3/uL — ABNORMAL HIGH (ref 4.0–10.5)
nRBC: 0 % (ref 0.0–0.2)

## 2019-05-29 LAB — URINALYSIS, ROUTINE W REFLEX MICROSCOPIC
Bilirubin Urine: NEGATIVE
Glucose, UA: 50 mg/dL — AB
Hgb urine dipstick: NEGATIVE
Ketones, ur: 5 mg/dL — AB
Leukocytes,Ua: NEGATIVE
Nitrite: NEGATIVE
Protein, ur: 100 mg/dL — AB
Specific Gravity, Urine: >1.046 — ABNORMAL HIGH (ref 1.005–1.030)
pH: 5 (ref 5.0–8.0)

## 2019-05-29 LAB — ACETAMINOPHEN LEVEL: Acetaminophen (Tylenol), Serum: 10 ug/mL (ref 10–30)

## 2019-05-29 LAB — LIPASE, BLOOD: Lipase: 71 U/L — ABNORMAL HIGH (ref 11–51)

## 2019-05-29 MED ORDER — HYDROCORTISONE NA SUCCINATE PF 250 MG IJ SOLR
200.0000 mg | Freq: Once | INTRAMUSCULAR | Status: AC
  Administered 2019-05-29: 200 mg via INTRAVENOUS
  Filled 2019-05-29: qty 200

## 2019-05-29 MED ORDER — ONDANSETRON HCL 4 MG/2ML IJ SOLN
4.0000 mg | Freq: Once | INTRAMUSCULAR | Status: AC
  Administered 2019-05-29: 4 mg via INTRAVENOUS
  Filled 2019-05-29: qty 2

## 2019-05-29 MED ORDER — IOHEXOL 300 MG/ML  SOLN
100.0000 mL | Freq: Once | INTRAMUSCULAR | Status: AC | PRN
  Administered 2019-05-29: 100 mL via INTRAVENOUS

## 2019-05-29 MED ORDER — DIPHENHYDRAMINE HCL 50 MG/ML IJ SOLN
50.0000 mg | Freq: Once | INTRAMUSCULAR | Status: AC
  Administered 2019-05-29: 50 mg via INTRAVENOUS
  Filled 2019-05-29: qty 1

## 2019-05-29 MED ORDER — SODIUM CHLORIDE 0.9 % IV BOLUS
1000.0000 mL | Freq: Once | INTRAVENOUS | Status: AC
  Administered 2019-05-29: 1000 mL via INTRAVENOUS

## 2019-05-29 MED ORDER — FAMOTIDINE IN NACL 20-0.9 MG/50ML-% IV SOLN
20.0000 mg | Freq: Once | INTRAVENOUS | Status: AC
  Administered 2019-05-29: 20 mg via INTRAVENOUS
  Filled 2019-05-29: qty 50

## 2019-05-29 MED ORDER — FENTANYL CITRATE (PF) 100 MCG/2ML IJ SOLN
100.0000 ug | Freq: Once | INTRAMUSCULAR | Status: AC
  Administered 2019-05-29: 100 ug via INTRAVENOUS
  Filled 2019-05-29: qty 2

## 2019-05-29 MED ORDER — SODIUM CHLORIDE 0.9% FLUSH: 3.0000 mL | Freq: Once | INTRAVENOUS | Status: DC

## 2019-05-29 NOTE — ED Notes (Signed)
Alexander Barton, wife, [phone] would like an update when available

## 2019-05-29 NOTE — ED Triage Notes (Signed)
Patient has been experiencing epigastric pain intermittently x 4 weeks. Patient reports mass seen in pancreas but MD concerned all related to gallbladder. Has had increased pain since Friday and using tylenol and tramadol with minimal relief. Nausea with no emesis. Pain worse after eating

## 2019-05-29 NOTE — ED Provider Notes (Signed)
[facility] EMERGENCY DEPARTMENT Provider Note   CSN: [id number] Arrival date & time: 05/29/19  1359     History Chief Complaint  Patient presents with  . Abdominal Pain    Alexander Barton is a 69 y.o. male.  69 yo M with a chief complaints of epigastric abdominal pain nausea and vomiting.  This been going on for the past week and worsening.  Has been pretty severe over the past 72 hours or so.  No fevers.  Worse with eating.  Has not really been able to eat or drink.  Whenever he does the pain gets much worse.  It has migrated from the umbilicus to the left side to the left upper quadrant and now his more in the right mid abdomen.  He has had some radiation to the right shoulder.  Does get somewhat worse with positioning and certain movements.  Initially had this pain after a real rigorous PT session.  This was about 2 weeks ago.  The history is provided by the patient.  Abdominal Pain Pain location:  RUQ Pain quality: sharp and shooting   Pain radiates to:  Does not radiate Pain severity:  Moderate Onset quality:  Gradual Duration:  2 weeks Timing:  Constant Progression:  Worsening Chronicity:  New Relieved by:  Nothing Worsened by:  Nothing Ineffective treatments:  None tried Associated symptoms: nausea and vomiting   Associated symptoms: no chest pain, no chills, no diarrhea, no fever and no shortness of breath        Past Medical History:  Diagnosis Date  . Aortic aneurysm, thoracic ([place])   . Aortic valve disease   . Arthritis   . BPH (benign prostatic hyperplasia)   . COPD (chronic obstructive pulmonary disease) ([place])   . GERD (gastroesophageal reflux disease)   . HTN (hypertension)   . Hypercholesterolemia   . Stroke ([place])   . TIA (transient ischemic attack)     Patient Active Problem List   Diagnosis Date Noted  . Bilateral finger numbness 01/20/2019  . Headache 01/20/2019  . Night sweats 01/20/2019  . UTI (urinary tract  infection) 01/20/2019  . Swelling of male genital structure 01/20/2019  . Educated about COVID-19 virus infection 01/05/2019  . Cardiomyopathy ([place]) 01/05/2019  . COVID-19 11/10/2018  . Fall 05/07/2018  . Abdominal pain 01/04/2018  . Costochondritis 01/04/2018  . Thyroid nodule 01/04/2018  . Dissecting aneurysm of thoracic aorta, Stanford type B ([place]) 11/08/2017  . Anemia 11/08/2017  . Hyponatremia 11/08/2017  . Overweight 07/01/2017  . TIA (transient ischemic attack)   . Stroke ([place])   . GERD (gastroesophageal reflux disease)   . Arthritis   . Neck pain 12/01/2016  . Knee osteoarthritis 12/01/2016  . Physical deconditioning 06/24/2016  . Dyspnea on exertion 06/23/2016  . Chronic pain of right knee 12/18/2015  . Chronic obstructive pulmonary disease ([place]) 11/14/2014  . BPH (benign prostatic hyperplasia) 11/14/2014  . Preventative health care 11/14/2014  . Long term current use of anticoagulant therapy 10/29/2013  . Cerebral infarction ([place]) 06/02/2013  . H/O aortic valve replacement 12/26/2011  . History of aortic arch replacement 01/07/2011  . HTN (hypertension)   . Hyperlipidemia   . Aortic valve disease     Past Surgical History:  Procedure Laterality Date  . ACHILLES TENDON REPAIR    . AORTIC VALVE REPLACEMENT     #23 On-X valve conduit  . ASCENDING AORTIC ANEURYSM REPAIR    . BACK SURGERY    . FOOT  SURGERY         Family History  Problem Relation Age of Onset  . Alzheimer's disease Father   . Diabetes Father        age onset DM  . Hypertension Sister   . Hypertension Brother   . Diabetes Mother   . Diabetes Sister   . Colon cancer Neg Hx     Social History   Tobacco Use  . Smoking status: Never Smoker  . Smokeless tobacco: Never Used  Substance Use Topics  . Alcohol use: No    Alcohol/week: 0.0 standard drinks  . Drug use: No    Home Medications Prior to Admission medications   Medication Sig Start Date End Date Taking? Authorizing Provider    albuterol (VENTOLIN HFA) 108 (90 Base) MCG/ACT inhaler Inhale 2 puffs into the lungs every 4 (four) hours as needed for wheezing or shortness of breath. 09/10/18   Leone Haven, MD  aspirin 81 MG tablet Take 81 mg by mouth at bedtime.     [provider]  benzonatate (TESSALON) 100 MG capsule Take 1 or 2 capsules every 8 hours as needed for cough 03/29/18   Guse, Jacquelynn Cree, FNP  budesonide-formoterol (SYMBICORT) 160-4.5 MCG/ACT inhaler TAKE 2 PUFFS BY MOUTH TWICE A DAY 06/11/18   Flora Lipps, MD  furosemide (LASIX) 20 MG tablet TAKE 1/2 TABLET (10 MG) 1 OR 2 TIMES A DAY AS NEEDED FOR LEG SWELLING 09/01/18   Leone Haven, MD  metoprolol succinate (TOPROL XL) 25 MG 24 hr tablet Take 1 tablet (25 mg total) by mouth daily. 02/01/19   Hilty, Nadean Corwin, MD  NON FORMULARY     [provider]  Polyethylene Glycol 3350 (MIRALAX PO) Take by mouth.    [provider]  predniSONE (DELTASONE) 50 MG tablet Take 1 tablet (50 mg) by mouth 13 hours prior to your scan, then take 1 tablet (50 mg) by mouth 7 hours prior to scan, then take 1 tablet (50 mg) by mouth 1 hour prior to your scan 05/25/19   Leone Haven, MD  senna-docusate (SENOKOT-S) 8.6-50 MG tablet Take by mouth. 10/25/17   [provider]  sulfamethoxazole-trimethoprim (BACTRIM DS) 800-160 MG tablet Take 1 tablet by mouth 2 (two) times daily. 01/13/19   Leone Haven, MD  tamsulosin (FLOMAX) 0.4 MG CAPS capsule TAKE 2 CAPSULES BY MOUTH EVERY DAY 04/05/19   Leone Haven, MD  Tiotropium Bromide Monohydrate (SPIRIVA RESPIMAT) 2.5 MCG/ACT AERS INHALE 2.5 MCG INTO THE LUNGS 2 (TWO) TIMES DAILY. 06/11/18   Flora Lipps, MD  warfarin (COUMADIN) 2 MG tablet TAKE 1 TABLET WITH 5 MG TABLET DAILY AS DIRECTED BY COUMADIN CLINIC 05/16/19   [name], [name] M, MD  warfarin (COUMADIN) 5 MG tablet TAKE 1 TABLET DAILY AS DIRECTED WITH 2 MG TABLET 01/24/19   [name], [name] M, MD    Allergies    Codeine, Gadolinium  derivatives, Hydrocodone, Iodinated diagnostic agents, Statins, Hydromorphone, Lipitor [atorvastatin calcium], Other, and Seroquel [quetiapine fumerate]  Review of Systems   Review of Systems  Constitutional: Negative for chills and fever.  HENT: Negative for congestion and facial swelling.   Eyes: Negative for discharge and visual disturbance.  Respiratory: Negative for shortness of breath.   Cardiovascular: Negative for chest pain and palpitations.  Gastrointestinal: Positive for abdominal pain, nausea and vomiting. Negative for diarrhea.  Musculoskeletal: Negative for arthralgias and myalgias.  Skin: Negative for color change and rash.  Neurological: Negative for tremors, syncope and  headaches.  Psychiatric/Behavioral: Negative for confusion and dysphoric mood.    Physical Exam Updated Vital Signs BP 123/64 Comment: room air  Pulse (!) 101 Comment: room air  Temp 98.4 F (36.9 C) (Oral)   Resp 18 Comment: room air  SpO2 93% Comment: room air  Physical Exam Vitals and nursing note reviewed.  Constitutional:      Appearance: He is well-developed.  HENT:     Head: Normocephalic and atraumatic.  Eyes:     Pupils: Pupils are equal, round, and reactive to light.  Neck:     Vascular: No JVD.  Cardiovascular:     Rate and Rhythm: Normal rate and regular rhythm.     Heart sounds: No murmur. No friction rub. No gallop.   Pulmonary:     Effort: No respiratory distress.     Breath sounds: No wheezing.  Abdominal:     General: There is no distension.     Tenderness: There is abdominal tenderness. There is no guarding or rebound.     Comments: Pain is worse just adjacent and inferior to the umbilicus.  It extends up to the costal margin though he has no pain just underneath the costal margin.  Musculoskeletal:        General: Normal range of motion.     Cervical back: Normal range of motion and neck supple.  Skin:    Coloration: Skin is not pale.     Findings: No rash.    Neurological:     Mental Status: He is alert and oriented to person, place, and time.  Psychiatric:        Behavior: Behavior normal.     ED Results / Procedures / Treatments   Labs (all labs ordered are listed, but only abnormal results are displayed) Labs Reviewed  LIPASE, BLOOD - Abnormal; Notable for the following components:      Result Value   Lipase 71 (*)    All other components within normal limits  COMPREHENSIVE METABOLIC PANEL - Abnormal; Notable for the following components:   Sodium 133 (*)    Chloride 97 (*)    Glucose, Bld 241 (*)    Creatinine, Ser 1.30 (*)    Albumin 3.0 (*)    Total Bilirubin 1.7 (*)    GFR calc non Af Amer 55 (*)    All other components within normal limits  CBC - Abnormal; Notable for the following components:   WBC 12.9 (*)    RBC 3.54 (*)    Hemoglobin 10.0 (*)    HCT 31.1 (*)    All other components within normal limits  URINALYSIS, ROUTINE W REFLEX MICROSCOPIC - Abnormal; Notable for the following components:   APPearance HAZY (*)    Specific Gravity, Urine >1.046 (*)    Glucose, UA 50 (*)    Ketones, ur 5 (*)    Protein, ur 100 (*)    Bacteria, UA RARE (*)    All other components within normal limits  ACETAMINOPHEN LEVEL    EKG None  Radiology CT ABDOMEN PELVIS W CONTRAST  Result Date: 05/29/2019 CLINICAL DATA:  Epigastric pain and nausea x4 weeks. EXAM: CT ABDOMEN AND PELVIS WITH CONTRAST TECHNIQUE: Multidetector CT imaging of the abdomen and pelvis was performed using the standard protocol following bolus administration of intravenous contrast. CONTRAST:  174mL OMNIPAQUE IOHEXOL 300 MG/ML  SOLN COMPARISON:  May 26, 2019 FINDINGS: Lower chest: Multiple sternal wires are seen. Mild areas of atelectasis and/or scarring are seen within the  bilateral lung bases. There is stenting of the visualized portion of the descending thoracic aorta. Hepatobiliary: No focal liver abnormality is seen. The 2.6 cm low-attenuation liver lesion  seen within the region of the caudate lobe on the prior study is not clearly identified on the current exam. Mild perihepatic fluid is seen within this region as well as the adjacent portion of the anterior right lobe of the liver. This represents a new finding when compared to the prior exam. Subcentimeter gallstones are seen within the gallbladder lumen without evidence of gallbladder wall thickening or biliary dilatation. Pancreas: A 9.6 cm x 5.5 cm well-defined area of heterogeneous low attenuation is again seen within the tail of the pancreas. Spleen: Normal in size without focal abnormality. Adrenals/Urinary Tract: Adrenal glands are unremarkable. Kidneys are normal, without renal calculi or hydronephrosis. A 3.2 cm simple cyst is seen within the posterior aspect of the lower pole of the right kidney. Bladder is unremarkable. Stomach/Bowel: There is mild thickening of the gastric antrum. Appendix appears normal. No evidence of bowel dilatation. Vascular/Lymphatic: Moderate to marked severity aortic calcification and atherosclerosis is seen with chronic, partially calcified dissection extending along the length of the abdominal aorta to the level of the bifurcation. No enlarged abdominal or pelvic lymph nodes. Mild right upper quadrant inflammatory fat stranding is seen within the region in between the gastric antrum/proximal duodenum and the right lobe of the liver. This extends inferiorly along the lateral aspect of the mid to upper right abdomen and represents a new finding when compared to the prior study. Reproductive: Prostate is unremarkable. Other: A mild-to-moderate amount of posterior pelvic free fluid is seen. Musculoskeletal: Degenerative changes seen throughout the lumbar spine. IMPRESSION: 1. Cholelithiasis without evidence of acute cholecystitis. 2. 9.6 cm x 5.5 cm well-defined area of heterogeneous low attenuation is again seen within the tail of the pancreas. Further evaluation with MRI is  recommended. 3. Interval development of mild right upper quadrant inflammatory fat stranding since the prior study. This may represent sequelae associated with mild gastritis within the region of the gastric antrum. 4. Moderate to marked severity aortic calcification and atherosclerosis with chronic, partially calcified dissection extending along the length of the abdominal aorta to the level of the bifurcation. 5. 3.2 cm right renal cyst. 6. Mild-to-moderate amount of posterior pelvic free fluid, increased in severity when compared to the prior study. Aortic Atherosclerosis (ICD10-I70.0). Electronically Signed   By: Virgina Norfolk M.D.   On: 05/29/2019 20:12   US Abdomen Limited RUQ  Result Date: 05/29/2019 CLINICAL DATA:  Right upper quadrant pain. EXAM: ULTRASOUND ABDOMEN LIMITED RIGHT UPPER QUADRANT COMPARISON:  None. FINDINGS: Gallbladder: Multiple subcentimeter echogenic gallstones are seen within the gallbladder lumen without evidence of gallbladder wall thickening (1.2 mm). A mild amount of echogenic gallbladder sludge is also seen. A small amount of pericholecystic fluid is seen. No sonographic Murphy sign noted by sonographer. Common bile duct: Diameter: 3.8 mm proximally with limited visualization distally. Liver: No focal lesion identified. Diffusely increased echogenicity of the liver parenchyma is noted. Portal vein is patent on color Doppler imaging with normal direction of blood flow towards the liver. Other: A mild amount of fluid is seen adjacent to the caudate lobe and left lobe of the liver. A complex right pleural effusion is also seen. IMPRESSION: 1. Cholelithiasis and gallbladder sludge without evidence of acute cholecystitis. 2. Free fluid adjacent to the caudate lobe and left lobe of the liver with a complex right pleural effusion. Electronically Signed  By: Virgina Norfolk M.D.   On: 05/29/2019 21:08    Procedures Procedures (including critical care time)  Medications  Ordered in ED Medications  sodium chloride flush (NS) 0.9 % injection 3 mL (has no administration in time range)  sodium chloride 0.9 % bolus 1,000 mL (has no administration in time range)  ondansetron (ZOFRAN) injection 4 mg (4 mg Intravenous Given 05/29/19 1904)  fentaNYL (SUBLIMAZE) injection 100 mcg (100 mcg Intravenous Given 05/29/19 1902)  famotidine (PEPCID) IVPB 20 mg premix (20 mg Intravenous New Bag/Given 05/29/19 1912)  diphenhydrAMINE (BENADRYL) injection 50 mg (50 mg Intravenous Given 05/29/19 1903)  hydrocortisone sodium succinate (SOLU-CORTEF) injection 200 mg (200 mg Intravenous Given 05/29/19 1858)  iohexol (OMNIPAQUE) 300 MG/ML solution 100 mL (100 mLs Intravenous Contrast Given 05/29/19 1950)    ED Course  I have reviewed the triage vital signs and the nursing notes.  Pertinent labs & imaging results that were available during my care of the patient were reviewed by me and considered in my medical decision making (see chart for details).    MDM Rules/Calculators/A&P                      69 yo M with a chief complaints of right-sided abdominal pain.  He has been actually having pain in his abdomen for a couple weeks but worsened at the beginning of the week and seemed to subside a bit and then reoccurred over the past 72 hours.  Described as severe to the point where he has not been able to eat and drink.  He is a mild acute kidney injury on lab work.  Has a mild leukocytosis since 12.9.  Lipase is also mildly elevated at 70.  Patient just had a CT scan a week ago for similar pain.  Showed a new pancreatic mass.  Is supposed to have a endoscopic ultrasound done soon.  He has an appointment on Tuesday to talk it over with a gastroenterologist.  We will obtain a CT scan here today to reassess.  Will attempt to do it with contrast dye.  Patient has a listed contrast dye allergy though his reaction is uncontrolled sneezing.  He has been given pretreatment immediately preceding the  image without reaction.  Patient feeling better on reassessment.  No significant change in imaging from last week. [place] with gallbladder sludge, some fluid around the liver.  Will discuss with GI.  I discussed case with Dr. Fuller Plan who felt that if the patient symptoms were worsening recommended coming to the hospital under the hospitalist service.  The next best image he thought might be MRI.  The patients results and plan were reviewed and discussed.   Any x-rays performed were independently reviewed by myself.   Differential diagnosis were considered with the presenting HPI.  Medications  sodium chloride flush (NS) 0.9 % injection 3 mL (has no administration in time range)  sodium chloride 0.9 % bolus 1,000 mL (has no administration in time range)  ondansetron (ZOFRAN) injection 4 mg (4 mg Intravenous Given 05/29/19 1904)  fentaNYL (SUBLIMAZE) injection 100 mcg (100 mcg Intravenous Given 05/29/19 1902)  famotidine (PEPCID) IVPB 20 mg premix (20 mg Intravenous New Bag/Given 05/29/19 1912)  diphenhydrAMINE (BENADRYL) injection 50 mg (50 mg Intravenous Given 05/29/19 1903)  hydrocortisone sodium succinate (SOLU-CORTEF) injection 200 mg (200 mg Intravenous Given 05/29/19 1858)  iohexol (OMNIPAQUE) 300 MG/ML solution 100 mL (100 mLs Intravenous Contrast Given 05/29/19 1950)    Vitals:   05/29/19  1425 05/29/19 1845 05/29/19 1930  BP: (!) 101/53 (!) 148/70 123/64  Pulse: 90 (!) 103 (!) 101  Resp: 16 (!) 27 18  Temp: 98.4 F (36.9 C)    TempSrc: Oral    SpO2: 96% 98% 93%    Final diagnoses:  Abdominal pain, RUQ    Admission/ observation were discussed with the admitting physician, patient and/or family and they are comfortable with the plan.    Final Clinical Impression(s) / ED Diagnoses Final diagnoses:  Abdominal pain, RUQ    Rx / DC Orders ED Discharge Orders    None       Deno Etienne, DO 05/29/19 2313

## 2019-05-30 ENCOUNTER — Telehealth: Payer: Self-pay | Admitting: Cardiology

## 2019-05-30 ENCOUNTER — Encounter (HOSPITAL_COMMUNITY): Payer: Self-pay | Admitting: Internal Medicine

## 2019-05-30 ENCOUNTER — Telehealth: Payer: Self-pay

## 2019-05-30 ENCOUNTER — Ambulatory Visit: Payer: Medicare PPO

## 2019-05-30 ENCOUNTER — Observation Stay (HOSPITAL_COMMUNITY): Payer: Medicare PPO

## 2019-05-30 DIAGNOSIS — K8020 Calculus of gallbladder without cholecystitis without obstruction: Secondary | ICD-10-CM | POA: Diagnosis not present

## 2019-05-30 DIAGNOSIS — K862 Cyst of pancreas: Secondary | ICD-10-CM

## 2019-05-30 DIAGNOSIS — K8689 Other specified diseases of pancreas: Secondary | ICD-10-CM | POA: Diagnosis present

## 2019-05-30 DIAGNOSIS — D509 Iron deficiency anemia, unspecified: Secondary | ICD-10-CM

## 2019-05-30 DIAGNOSIS — R109 Unspecified abdominal pain: Secondary | ICD-10-CM | POA: Diagnosis present

## 2019-05-30 DIAGNOSIS — R112 Nausea with vomiting, unspecified: Secondary | ICD-10-CM | POA: Diagnosis not present

## 2019-05-30 DIAGNOSIS — R739 Hyperglycemia, unspecified: Secondary | ICD-10-CM | POA: Diagnosis present

## 2019-05-30 DIAGNOSIS — D490 Neoplasm of unspecified behavior of digestive system: Secondary | ICD-10-CM | POA: Diagnosis not present

## 2019-05-30 LAB — COMPREHENSIVE METABOLIC PANEL
ALT: 14 U/L (ref 0–44)
AST: 14 U/L — ABNORMAL LOW (ref 15–41)
Albumin: 2.4 g/dL — ABNORMAL LOW (ref 3.5–5.0)
Alkaline Phosphatase: 57 U/L (ref 38–126)
Anion gap: 10 (ref 5–15)
BUN: 18 mg/dL (ref 8–23)
CO2: 22 mmol/L (ref 22–32)
Calcium: 8.2 mg/dL — ABNORMAL LOW (ref 8.9–10.3)
Chloride: 102 mmol/L (ref 98–111)
Creatinine, Ser: 1.14 mg/dL (ref 0.61–1.24)
GFR calc Af Amer: >60 mL/min (ref >60)
GFR calc non Af Amer: >60 mL/min (ref >60)
Glucose, Bld: 245 mg/dL — ABNORMAL HIGH (ref 70–99)
Potassium: 4.5 mmol/L (ref 3.5–5.1)
Sodium: 134 mmol/L — ABNORMAL LOW (ref 135–145)
Total Bilirubin: 1.1 mg/dL (ref 0.3–1.2)
Total Protein: 6.4 g/dL — ABNORMAL LOW (ref 6.5–8.1)

## 2019-05-30 LAB — CREATININE, SERUM
Creatinine, Ser: 1.14 mg/dL (ref 0.61–1.24)
GFR calc Af Amer: >60 mL/min (ref >60)
GFR calc non Af Amer: >60 mL/min (ref >60)

## 2019-05-30 LAB — CBC
HCT: 26.9 % — ABNORMAL LOW (ref 39.0–52.0)
HCT: 27.6 % — ABNORMAL LOW (ref 39.0–52.0)
Hemoglobin: 8.5 g/dL — ABNORMAL LOW (ref 13.0–17.0)
Hemoglobin: 8.8 g/dL — ABNORMAL LOW (ref 13.0–17.0)
MCH: 28.3 pg (ref 26.0–34.0)
MCH: 28.2 pg (ref 26.0–34.0)
MCHC: 31.6 g/dL (ref 30.0–36.0)
MCHC: 31.9 g/dL (ref 30.0–36.0)
MCV: 89.7 fL (ref 80.0–100.0)
MCV: 88.5 fL (ref 80.0–100.0)
Platelets: 302 10*3/uL (ref 150–400)
Platelets: 311 10*3/uL (ref 150–400)
RBC: 3 MIL/uL — ABNORMAL LOW (ref 4.22–5.81)
RBC: 3.12 MIL/uL — ABNORMAL LOW (ref 4.22–5.81)
RDW: 14.9 % (ref 11.5–15.5)
RDW: 14.7 % (ref 11.5–15.5)
WBC: 13.1 10*3/uL — ABNORMAL HIGH (ref 4.0–10.5)
WBC: 13 10*3/uL — ABNORMAL HIGH (ref 4.0–10.5)
nRBC: 0 % (ref 0.0–0.2)
nRBC: 0 % (ref 0.0–0.2)

## 2019-05-30 LAB — FOLATE: Folate: >24 ng/mL

## 2019-05-30 LAB — PROTIME-INR
INR: 4.1 (ref 0.8–1.2)
Prothrombin Time: 39.6 seconds — ABNORMAL HIGH (ref 11.4–15.2)

## 2019-05-30 LAB — CEA: CEA: 1 ng/mL

## 2019-05-30 LAB — GLUCOSE, CAPILLARY
Glucose-Capillary: 187 mg/dL — ABNORMAL HIGH (ref 70–99)
Glucose-Capillary: 170 mg/dL — ABNORMAL HIGH (ref 70–99)
Glucose-Capillary: 157 mg/dL — ABNORMAL HIGH (ref 70–99)
Glucose-Capillary: 192 mg/dL — ABNORMAL HIGH (ref 70–99)

## 2019-05-30 LAB — SARS CORONAVIRUS 2 (TAT 6-24 HRS): SARS Coronavirus 2: NEGATIVE

## 2019-05-30 LAB — HEMOGLOBIN A1C
Hgb A1c MFr Bld: 7.3 % — ABNORMAL HIGH (ref 4.8–5.6)
Mean Plasma Glucose: 162.81 mg/dL

## 2019-05-30 LAB — HIV ANTIBODY (ROUTINE TESTING W REFLEX): HIV Screen 4th Generation wRfx: NONREACTIVE

## 2019-05-30 LAB — LACTIC ACID, PLASMA: Lactic Acid, Venous: 1.3 mmol/L (ref 0.5–1.9)

## 2019-05-30 LAB — VITAMIN B12: Vitamin B-12: 684 pg/mL (ref 200–1100)

## 2019-05-30 LAB — FERRITIN: Ferritin: 243 ng/mL (ref 24–380)

## 2019-05-30 LAB — CANCER ANTIGEN 19-9: CA 19-9: 5 U/mL (ref <34)

## 2019-05-30 LAB — IRON: Iron: 15 ug/dL — ABNORMAL LOW (ref 50–180)

## 2019-05-30 MED ORDER — HEPARIN SODIUM (PORCINE) 5000 UNIT/ML IJ SOLN: 5000.0000 [IU] | Freq: Three times a day (TID) | INTRAMUSCULAR | Status: DC

## 2019-05-30 MED ORDER — ONDANSETRON HCL 4 MG/2ML IJ SOLN: 4.0000 mg | Freq: Four times a day (QID) | INTRAMUSCULAR | Status: DC | PRN

## 2019-05-30 MED ORDER — PANTOPRAZOLE SODIUM 40 MG IV SOLR
40.0000 mg | Freq: Once | INTRAVENOUS | Status: AC
  Administered 2019-05-30: 40 mg via INTRAVENOUS
  Filled 2019-05-30: qty 40

## 2019-05-30 MED ORDER — MORPHINE SULFATE (PF) 2 MG/ML IV SOLN
2.0000 mg | Freq: Once | INTRAVENOUS | Status: AC
  Administered 2019-05-30: 2 mg via INTRAVENOUS
  Filled 2019-05-30: qty 1

## 2019-05-30 MED ORDER — ONDANSETRON HCL 4 MG PO TABS: 4.0000 mg | ORAL_TABLET | Freq: Four times a day (QID) | ORAL | Status: DC | PRN

## 2019-05-30 MED ORDER — SODIUM CHLORIDE 0.9 % IV SOLN: Freq: Once | INTRAVENOUS | Status: AC

## 2019-05-30 MED ORDER — FENTANYL CITRATE (PF) 100 MCG/2ML IJ SOLN
100.0000 ug | INTRAMUSCULAR | Status: DC | PRN
  Administered 2019-05-30 – 2019-06-02 (×13): 100 ug via INTRAVENOUS
  Filled 2019-05-30 (×13): qty 2

## 2019-05-30 MED ORDER — GADOBUTROL 1 MMOL/ML IV SOLN
10.0000 mL | Freq: Once | INTRAVENOUS | Status: AC | PRN
  Administered 2019-05-30: 10 mL via INTRAVENOUS

## 2019-05-30 MED ORDER — INSULIN ASPART 100 UNIT/ML ~~LOC~~ SOLN
0.0000 [IU] | Freq: Three times a day (TID) | SUBCUTANEOUS | Status: DC
  Administered 2019-05-30 – 2019-05-31 (×5): 2 [IU] via SUBCUTANEOUS
  Administered 2019-05-31: 3 [IU] via SUBCUTANEOUS
  Administered 2019-06-01 (×2): 1 [IU] via SUBCUTANEOUS
  Administered 2019-06-01 – 2019-06-02 (×2): 2 [IU] via SUBCUTANEOUS
  Administered 2019-06-02 (×2): 1 [IU] via SUBCUTANEOUS
  Administered 2019-06-03: 3 [IU] via SUBCUTANEOUS
  Administered 2019-06-03: 2 [IU] via SUBCUTANEOUS
  Administered 2019-06-03: 3 [IU] via SUBCUTANEOUS
  Administered 2019-06-04: 7 [IU] via SUBCUTANEOUS
  Administered 2019-06-04: 3 [IU] via SUBCUTANEOUS
  Administered 2019-06-04: 9 [IU] via SUBCUTANEOUS
  Administered 2019-06-05: 3 [IU] via SUBCUTANEOUS
  Administered 2019-06-05: 2 [IU] via SUBCUTANEOUS

## 2019-05-30 MED ORDER — BISACODYL 10 MG RE SUPP
10.0000 mg | Freq: Every day | RECTAL | Status: DC | PRN
  Administered 2019-05-30: 10 mg via RECTAL
  Filled 2019-05-30: qty 1

## 2019-05-30 MED ORDER — FENTANYL CITRATE (PF) 100 MCG/2ML IJ SOLN
100.0000 ug | INTRAMUSCULAR | Status: DC | PRN
  Administered 2019-05-30 (×2): 100 ug via INTRAVENOUS
  Filled 2019-05-30 (×2): qty 2

## 2019-05-30 MED ORDER — SODIUM CHLORIDE 0.9 % IV SOLN: INTRAVENOUS | Status: DC

## 2019-05-30 MED ORDER — ACETAMINOPHEN 325 MG PO TABS
650.0000 mg | ORAL_TABLET | Freq: Four times a day (QID) | ORAL | Status: DC | PRN
  Administered 2019-05-31 – 2019-06-01 (×2): 650 mg via ORAL
  Filled 2019-05-30 (×3): qty 2

## 2019-05-30 MED ORDER — INSULIN ASPART 100 UNIT/ML ~~LOC~~ SOLN
0.0000 [IU] | Freq: Every day | SUBCUTANEOUS | Status: DC
  Administered 2019-06-04: 3 [IU] via SUBCUTANEOUS

## 2019-05-30 MED ORDER — ACETAMINOPHEN 650 MG RE SUPP
650.0000 mg | Freq: Four times a day (QID) | RECTAL | Status: DC | PRN
  Filled 2019-05-30: qty 1

## 2019-05-30 NOTE — ED Notes (Signed)
Back from CT

## 2019-05-30 NOTE — Telephone Encounter (Signed)
Patients wife states they are at [facility] anticipating surgery. She would like to let Erasmo Downer know that some doctors and surgeons may be calling her in regards to his INR. She also states that he has not had his coumadin for 2 days.

## 2019-05-30 NOTE — Consult Note (Addendum)
Berlie Hatchel Freshour 10/21/1950  [id number].    Requesting MD: Dr. Flora Lipps Chief Complaint/Reason for Consult: Pancreatitic Mass  HPI: SAHITH NURSE is a 69 y.o. male with with a history of hypertension, hyperlipidemia, CVA/TIA, COPD, GERD, hx AVR (11/2006) on Coumadin and prior thoracic aortic aneurysm repair (10/20) who presented to [facility] for abdominal pain and nausea.  Patient reports that approximately 1 month ago, after eating he had an episode of severe, epigastric abdominal pain that he describes as burning.  He reports that after Tylenol his symptoms subsided.  He has been having waxing and waning generalized abdominal pain that is worse in the upper abdomen since that time.  He notes that his symptoms are worse after eating and also sometimes with activity. His symptoms are associated with nausea. He denies any episodes of emesis.   He reports 1 week ago he had another "flare" of this pain that was very different after working with PT. He describes this pain as similar to when he had a "dissection". The  pain migrated from his RLQ and umbilicus to the upper abdomen with some radiation to the right shoulder.  He required both Tylenol and Ultram for this flare in order for the pain to resolve.  He was seen by his PCP on 4/21 who ordered a CT scan and lab work.  At that time WBC, LFTs and lipase were within normal limits.  CT without contrast showed a large heterogeneous multi lobular mass replacing the pancreatic tail and contiguous with the posterior stomach and inferior spleen.  This also showed hepatic steatosis and cholelithiasis without evidence of cholecystitis.  He was referred to GI and was scheduled to be seen on 4/27.  Additional lab work was obtained including a CEA that was normal and a CA 19-9 that is pending.  However patient had another severe flare of his pain on Friday, 4/23 after eating dinner that he describes as a dull, probing pain in the RLQ, RUQ,  epigastrium and LUQ pain that radiated to the right shoulder. This persisted over the weekend and was not relieved with oral medications.  He was seen in our emergency department on Sunday, 4/25 where he underwent lab work and imaging.  At that time WBC was noted to be 12.9, AST/ALT/alk phos within normal limits, T bili 1.7, lipase 71.  A CT with contrast was obtained that showed a 9.6 cm x 5.5 cm well-defined area of heterogeneous low attenuation within the tail of the pancreas.  This also showed cholelithiasis without evidence of acute cholecystitis.  A right upper quadrant ultrasound was obtained that showed cholelithiasis and gallbladder sludge without evidence of acute cholecystitis.  Patient was admitted to the medicine service.  GI was consulted and obtained a MRI.  This showed an 8.8 x 5.3 cm heterogeneous masslike lesion in the pancreatic tail is most consistent with a complex hemorrhagic pseudocyst. Additional the MRI showed Cholelithiasis with mild gallbladder wall thickening and inflammation. General surgery was asked to weigh in.   Patient reports he has never had a pain like this prior to 1 month ago. No personal or family hx of cancer. No prior abdominal surgeries. He reports no recent weight loss, however has not been able to have much oral intake over the last 2 weeks 2/2 pain and nausea. He has suffered from some constipation due to decreased intake with his last BM on 4/21. He is still passing flatus. Patient denies any prior abdominal surgeries.  No  alcohol, tobacco or illicit drug use.  He is retired.  He lives at home with his wife.  ROS: Review of Systems  Constitutional: Positive for malaise/fatigue. Negative for chills and fever.  Respiratory: Negative for cough and shortness of breath.   Cardiovascular: Negative for chest pain.  Gastrointestinal: Positive for abdominal pain, constipation and nausea. Negative for diarrhea and vomiting.  Genitourinary: Negative for dysuria.    Musculoskeletal: Negative for back pain.  Psychiatric/Behavioral: Negative for substance abuse.    Family History  Problem Relation Age of Onset  . Alzheimer's disease Father   . Diabetes Father        age onset DM  . Hypertension Sister   . Hypertension Brother   . Diabetes Mother   . Diabetes Sister   . Colon cancer Neg Hx     Past Medical History:  Diagnosis Date  . Aortic aneurysm, thoracic ([place])   . Aortic valve disease   . Arthritis   . BPH (benign prostatic hyperplasia)   . COPD (chronic obstructive pulmonary disease) ([place])   . GERD (gastroesophageal reflux disease)   . HTN (hypertension)   . Hypercholesterolemia   . Stroke ([place])   . TIA (transient ischemic attack)     Past Surgical History:  Procedure Laterality Date  . ACHILLES TENDON REPAIR    . AORTIC VALVE REPLACEMENT     #23 On-X valve conduit  . ASCENDING AORTIC ANEURYSM REPAIR    . BACK SURGERY    . FOOT SURGERY      Social History:  reports that he has never smoked. He has never used smokeless tobacco. He reports that he does not drink alcohol or use drugs.  Allergies:  Allergies  Allergen Reactions  . Codeine Anaphylaxis  . Gadolinium Derivatives Other (See Comments)    Other Reaction: intense sneezing MRI contrast  . Hydrocodone Anaphylaxis  . Iodinated Diagnostic Agents Other (See Comments)    Uncontrollable sneezing, needs sedative the night before and hour and benadryl before using Other Reaction: sneezing with SOB after prep  . Statins Other (See Comments)    Myalgias  . Hydromorphone Other (See Comments)  . Lipitor [Atorvastatin Calcium] Other (See Comments)    Myalgias   . Seroquel [Quetiapine Fumerate] Other (See Comments)    "bad trip" tongue swell    Medications Prior to Admission  Medication Sig Dispense Refill  . acetaminophen (TYLENOL) 500 MG tablet Take 1,000 mg by mouth every 6 (six) hours as needed for mild pain.    Marland Kitchen albuterol (VENTOLIN HFA) 108 (90 Base) MCG/ACT  inhaler Inhale 2 puffs into the lungs every 4 (four) hours as needed for wheezing or shortness of breath. 18 g 1  . aspirin 81 MG tablet Take 81 mg by mouth at bedtime.     . budesonide-formoterol (SYMBICORT) 160-4.5 MCG/ACT inhaler TAKE 2 PUFFS BY MOUTH TWICE A DAY (Patient taking differently: Inhale 2 puffs into the lungs in the morning and at bedtime. ) 30.6 Inhaler 4  . furosemide (LASIX) 20 MG tablet TAKE 1/2 TABLET (10 MG) 1 OR 2 TIMES A DAY AS NEEDED FOR LEG SWELLING (Patient taking differently: Take 10 mg by mouth 2 (two) times daily as needed for fluid or edema. ) 90 tablet 1  . Investigational - Study Medication Take 1 tablet by mouth daily. Study name: [id number] Additional study details: cholesterol [facility] [phone]    . Multiple Vitamin (MULTIVITAMIN WITH MINERALS) TABS tablet Take 1 tablet by mouth daily.    Marland Kitchen  tamsulosin (FLOMAX) 0.4 MG CAPS capsule TAKE 2 CAPSULES BY MOUTH EVERY DAY (Patient taking differently: Take 0.4 mg by mouth in the morning and at bedtime. ) 174 capsule 1  . Tiotropium Bromide Monohydrate (SPIRIVA RESPIMAT) 2.5 MCG/ACT AERS INHALE 2.5 MCG INTO THE LUNGS 2 (TWO) TIMES DAILY. (Patient taking differently: Inhale 1 puff into the lungs in the morning and at bedtime. ) 1 Inhaler 5  . traMADol (ULTRAM) 50 MG tablet Take 50 mg by mouth every 4 (four) hours as needed for moderate pain.     Marland Kitchen warfarin (COUMADIN) 2 MG tablet TAKE 1 TABLET WITH 5 MG TABLET DAILY AS DIRECTED BY COUMADIN CLINIC (Patient taking differently: Take 2 mg by mouth daily at 4 PM. ) 90 tablet 0  . warfarin (COUMADIN) 5 MG tablet TAKE 1 TABLET DAILY AS DIRECTED WITH 2 MG TABLET (Patient taking differently: Take 5 mg by mouth daily at 4 PM. ) 90 tablet 1  . Zinc 50 MG CAPS Take 1 capsule by mouth daily.    Marland Kitchen amoxicillin (AMOXIL) 500 MG capsule Take 2,000 mg by mouth See admin instructions. One hour prior to dental appointment    . benzonatate (TESSALON) 100 MG capsule Take 1 or 2 capsules  every 8 hours as needed for cough 30 capsule 1  . metoprolol succinate (TOPROL XL) 25 MG 24 hr tablet Take 1 tablet (25 mg total) by mouth daily. 90 tablet 1  . NON FORMULARY     . Polyethylene Glycol 3350 (MIRALAX PO) Take by mouth.    . predniSONE (DELTASONE) 50 MG tablet Take 1 tablet (50 mg) by mouth 13 hours prior to your scan, then take 1 tablet (50 mg) by mouth 7 hours prior to scan, then take 1 tablet (50 mg) by mouth 1 hour prior to your scan 3 tablet 0  . senna-docusate (SENOKOT-S) 8.6-50 MG tablet Take by mouth.    . sulfamethoxazole-trimethoprim (BACTRIM DS) 800-160 MG tablet Take 1 tablet by mouth 2 (two) times daily. 14 tablet 0     Physical Exam: Blood pressure 131/70, pulse (!) 109, temperature 98.5 F (36.9 C), temperature source Oral, resp. rate 20, height '6\' 1"'  (1.854 m), weight 97.6 kg, SpO2 94 %. General: pleasant, WD/WN white male who is laying in bed in NAD HEENT: head is normocephalic, atraumatic.  Sclera are noninjected.  PERRL.  Ears and nose without any masses or lesions.  Mouth is pink and moist. Dentition fair Heart: Regular, rate, and rhythm.  Mechanical click noted.  Palpable pedal pulses bilaterally. Prior median sternotomy scar noted and well healed.  Lungs: CTAB, no wheezes, rhonchi, or rales noted.  Respiratory effort nonlabored Abd: Soft, mild distension, tenderness of the epigastrium > RUQ/LUQ > LLQ/RLQ. No peritonitis. Negative Murphy's sign. +BS, no masses, hernias, or organomegaly. No prior abdominal scars MS: no BUE/BLE edema, calves soft and nontender Skin: warm and dry with no masses, lesions, or rashes Psych: A&Ox4 with an appropriate affect Neuro: cranial nerves grossly intact, equal strength in BUE/BLE bilaterally, normal speech, though process intact  Results for orders placed or performed during the hospital encounter of 05/29/19 (from the past 48 hour(s))  Lipase, blood     Status: Abnormal   Collection Time: 05/29/19  2:28 PM  Result Value  Ref Range   Lipase 71 (H) 11 - 51 U/L    Comment: Performed at [facility], [place] [address], [place], [place] [address]  Comprehensive metabolic panel     Status: Abnormal  Collection Time: 05/29/19  2:28 PM  Result Value Ref Range   Sodium 133 (L) 135 - 145 mmol/L   Potassium 4.9 3.5 - 5.1 mmol/L   Chloride 97 (L) 98 - 111 mmol/L   CO2 24 22 - 32 mmol/L   Glucose, Bld 241 (H) 70 - 99 mg/dL    Comment: Glucose reference range applies only to samples taken after fasting for at least 8 hours.   BUN 20 8 - 23 mg/dL   Creatinine, Ser 1.30 (H) 0.61 - 1.24 mg/dL   Calcium 9.2 8.9 - 10.3 mg/dL   Total Protein 7.5 6.5 - 8.1 g/dL   Albumin 3.0 (L) 3.5 - 5.0 g/dL   AST 19 15 - 41 U/L   ALT 14 0 - 44 U/L   Alkaline Phosphatase 71 38 - 126 U/L   Total Bilirubin 1.7 (H) 0.3 - 1.2 mg/dL   GFR calc non Af Amer 55 (L) >60 mL/min   GFR calc Af Amer >60 >60 mL/min   Anion gap 12 5 - 15    Comment: Performed at [facility] [address], [place], [place] [address]  CBC     Status: Abnormal   Collection Time: 05/29/19  2:28 PM  Result Value Ref Range   WBC 12.9 (H) 4.0 - 10.5 K/uL   RBC 3.54 (L) 4.22 - 5.81 MIL/uL   Hemoglobin 10.0 (L) 13.0 - 17.0 g/dL   HCT 31.1 (L) 39.0 - 52.0 %   MCV 87.9 80.0 - 100.0 fL   MCH 28.2 26.0 - 34.0 pg   MCHC 32.2 30.0 - 36.0 g/dL   RDW 14.6 11.5 - 15.5 %   Platelets 347 150 - 400 K/uL   nRBC 0.0 0.0 - 0.2 %    Comment: Performed at [facility], [place] [address], [place], [place] [address]  Acetaminophen level     Status: None   Collection Time: 05/29/19  6:52 PM  Result Value Ref Range   Acetaminophen (Tylenol), Serum 10 10 - 30 ug/mL    Comment: (NOTE) Therapeutic concentrations vary significantly. A range of 10-30 ug/mL  may be an effective concentration for many patients. However, some  are best treated at concentrations outside of this range. Acetaminophen concentrations >150 ug/mL at 4 hours after ingestion  and >50 ug/mL at 12  hours after ingestion are often associated with  toxic reactions. Performed at [facility], [place] [address] [place], [place] [address]   Urinalysis, Routine w reflex microscopic     Status: Abnormal   Collection Time: 05/29/19  9:20 PM  Result Value Ref Range   Color, Urine YELLOW YELLOW   APPearance HAZY (A) CLEAR   Specific Gravity, Urine >1.046 (H) 1.005 - 1.030   pH 5.0 5.0 - 8.0   Glucose, UA 50 (A) NEGATIVE mg/dL   Hgb urine dipstick NEGATIVE NEGATIVE   Bilirubin Urine NEGATIVE NEGATIVE   Ketones, ur 5 (A) NEGATIVE mg/dL   Protein, ur 100 (A) NEGATIVE mg/dL   Nitrite NEGATIVE NEGATIVE   Leukocytes,Ua NEGATIVE NEGATIVE   RBC / HPF 6-10 0 - 5 RBC/hpf   WBC, UA 0-5 0 - 5 WBC/hpf   Bacteria, UA RARE (A) NONE SEEN   Squamous Epithelial / LPF 0-5 0 - 5   Mucus PRESENT     Comment: Performed at [facility], [place] [address], [place], [place] [address]  SARS CORONAVIRUS 2 (TAT 6-24 HRS) Nasopharyngeal Nasopharyngeal Swab  Status: None   Collection Time: 05/30/19 12:25 AM   Specimen: Nasopharyngeal Swab  Result Value Ref Range   SARS Coronavirus 2 NEGATIVE NEGATIVE    Comment: (NOTE) SARS-CoV-2 target nucleic acids are NOT DETECTED. The SARS-CoV-2 RNA is generally detectable in upper and lower respiratory specimens during the acute phase of infection. Negative results do not preclude SARS-CoV-2 infection, do not rule out co-infections with other pathogens, and should not be used as the sole basis for treatment or other patient management decisions. Negative results must be combined with clinical observations, patient history, and epidemiological information. The expected result is Negative. Fact Sheet for Patients: SugarRoll.be Fact Sheet for Healthcare Providers: https://www.woods-mathews.com/ This test is not yet approved or cleared by the [place] FDA and  has been authorized for detection and/or diagnosis of  SARS-CoV-2 by FDA under an Emergency Use Authorization (EUA). This EUA will remain  in effect (meaning this test can be used) for the duration of the COVID-19 declaration under Section 56 4(b)(1) of the Act, 21 U.S.C. section 360bbb-3(b)(1), unless the authorization is terminated or revoked sooner. Performed at [facility], [place] [address], [place], [place] [address]   HIV Antibody (routine testing w rflx)     Status: None   Collection Time: 05/30/19 12:58 AM  Result Value Ref Range   HIV Screen 4th Generation wRfx NON REACTIVE NON REACTIVE    Comment: Performed at [facility] [address] [place], [place] [address]  CBC     Status: Abnormal   Collection Time: 05/30/19 12:58 AM  Result Value Ref Range   WBC 13.1 (H) 4.0 - 10.5 K/uL   RBC 3.00 (L) 4.22 - 5.81 MIL/uL   Hemoglobin 8.5 (L) 13.0 - 17.0 g/dL   HCT 26.9 (L) 39.0 - 52.0 %   MCV 89.7 80.0 - 100.0 fL   MCH 28.3 26.0 - 34.0 pg   MCHC 31.6 30.0 - 36.0 g/dL   RDW 14.9 11.5 - 15.5 %   Platelets 302 150 - 400 K/uL   nRBC 0.0 0.0 - 0.2 %    Comment: Performed at [facility], [place] [address] [place], [place] [address]  Creatinine, serum     Status: None   Collection Time: 05/30/19 12:58 AM  Result Value Ref Range   Creatinine, Ser 1.14 0.61 - 1.24 mg/dL   GFR calc non Af Amer >60 >60 mL/min   GFR calc Af Amer >60 >60 mL/min    Comment: Performed at [facility] [address] [place], [place] [address]  Protime-INR     Status: Abnormal   Collection Time: 05/30/19  1:19 AM  Result Value Ref Range   Prothrombin Time 39.6 (H) 11.4 - 15.2 seconds   INR 4.1 (HH) 0.8 - 1.2    Comment: REPEATED TO VERIFY CRITICAL RESULT CALLED TO, READ BACK BY AND VERIFIED WITH: AWATZKI,J RN '@[id number]'  05/30/2019 GOLDSTEIN,M (NOTE) INR goal varies based on device and disease states. Performed at [facility], [place] [address] [place],  [address]   CBC     Status: Abnormal   Collection Time: 05/30/19   4:10 AM  Result Value Ref Range   WBC 13.0 (H) 4.0 - 10.5 K/uL   RBC 3.12 (L) 4.22 - 5.81 MIL/uL   Hemoglobin 8.8 (L) 13.0 - 17.0 g/dL   HCT 27.6 (L) 39.0 - 52.0 %   MCV 88.5 80.0 - 100.0 fL   MCH 28.2 26.0 - 34.0  pg   MCHC 31.9 30.0 - 36.0 g/dL   RDW 14.7 11.5 - 15.5 %   Platelets 311 150 - 400 K/uL   nRBC 0.0 0.0 - 0.2 %    Comment: Performed at [facility], [place] [address] [place], [place] [address]  Comprehensive metabolic panel     Status: Abnormal   Collection Time: 05/30/19  4:10 AM  Result Value Ref Range   Sodium 134 (L) 135 - 145 mmol/L   Potassium 4.5 3.5 - 5.1 mmol/L   Chloride 102 98 - 111 mmol/L   CO2 22 22 - 32 mmol/L   Glucose, Bld 245 (H) 70 - 99 mg/dL    Comment: Glucose reference range applies only to samples taken after fasting for at least 8 hours.   BUN 18 8 - 23 mg/dL   Creatinine, Ser 1.14 0.61 - 1.24 mg/dL   Calcium 8.2 (L) 8.9 - 10.3 mg/dL   Total Protein 6.4 (L) 6.5 - 8.1 g/dL   Albumin 2.4 (L) 3.5 - 5.0 g/dL   AST 14 (L) 15 - 41 U/L   ALT 14 0 - 44 U/L   Alkaline Phosphatase 57 38 - 126 U/L   Total Bilirubin 1.1 0.3 - 1.2 mg/dL   GFR calc non Af Amer >60 >60 mL/min   GFR calc Af Amer >60 >60 mL/min   Anion gap 10 5 - 15    Comment: Performed at [facility], [place] [address] [place], [place] [address]  Glucose, capillary     Status: Abnormal   Collection Time: 05/30/19  8:17 AM  Result Value Ref Range   Glucose-Capillary 187 (H) 70 - 99 mg/dL    Comment: Glucose reference range applies only to samples taken after fasting for at least 8 hours.   MR Abdomen W or Wo Contrast  Result Date: 05/30/2019 CLINICAL DATA:  Pancreatic tail mass. Possible hemorrhagic pseudocyst EXAM: MRI ABDOMEN WITHOUT AND WITH CONTRAST TECHNIQUE: Multiplanar multisequence MR imaging of the abdomen was performed both before and after the administration of intravenous contrast. CONTRAST:  41m GADAVIST GADOBUTROL 1 MMOL/ML IV SOLN COMPARISON:  CT scans 05/26/2019  and 05/29/2019 FINDINGS: Lower chest: The lung bases demonstrates streaky atelectasis but no pleural or pericardial effusion. No worrisome pulmonary lesions. The distal esophagus is grossly normal. Aortic dissection is again demonstrated. Hepatobiliary: No hepatic lesions are identified. No intrahepatic biliary dilatation. The portal and hepatic veins are patent. Gallstones are noted in the gallbladder along with some layering gallbladder sludge. There is mild gallbladder wall thickening and inflammation. Normal caliber and course of the common bile duct. No common bile duct stones are identified. Pancreas: The pancreatic head is normal. No mass or acute inflammation. The main pancreatic duct is normal in caliber and has a normal course. Persistent large heterogeneous masslike lesion in the pancreatic tail measures approximately 8.8 x 5.3 cm. Heterogeneous T1 and T2 signal intensity with several areas of increased T1 signal intensity consistent with hemorrhage. No contrast enhancement is demonstrated. Findings most consistent with a complex hemorrhagic pseudocyst. Spleen:  Normal size. No focal lesions. Adrenals/Urinary Tract: The adrenal glands and kidneys are unremarkable. Simple right renal cyst is noted. Stomach/Bowel: The stomach, duodenum, visualized small bowel loops and visualized colon are unremarkable. Vascular/Lymphatic: Aortic dissection is again demonstrated which is a continuation of the thoracic aortic dissection. This continues all the way down to the bifurcation. The branch vessels are patent. Scattered small mesenteric and retroperitoneal lymph nodes. Fluid is noted along  the medial liver edge and there is a small focal fluid collection along the anterior wall of the antral region of the stomach. Thin rim like enhancement is noted. This is likely a small pseudocyst. Other:  None. Musculoskeletal: Musculoskeletal no significant bony findings. IMPRESSION: 1. 8.8 x 5.3 cm heterogeneous masslike lesion  in the pancreatic tail is most consistent with a complex hemorrhagic pseudocyst. 2. Cholelithiasis with mild gallbladder wall thickening and inflammation. 3. No common bile duct stones or biliary dilatation. 4. Chronic aortic dissection.  Aortic branch vessels are patent. 5. Small fluid collection along the anterior wall of the stomach, likely a small pseudocyst. There is also fluid along the medial liver edge which does not appear organized. Electronically Signed   By: Marijo Sanes M.D.   On: 05/30/2019 08:05   CT ABDOMEN PELVIS W CONTRAST  Result Date: 05/29/2019 CLINICAL DATA:  Epigastric pain and nausea x4 weeks. EXAM: CT ABDOMEN AND PELVIS WITH CONTRAST TECHNIQUE: Multidetector CT imaging of the abdomen and pelvis was performed using the standard protocol following bolus administration of intravenous contrast. CONTRAST:  112m OMNIPAQUE IOHEXOL 300 MG/ML  SOLN COMPARISON:  May 26, 2019 FINDINGS: Lower chest: Multiple sternal wires are seen. Mild areas of atelectasis and/or scarring are seen within the bilateral lung bases. There is stenting of the visualized portion of the descending thoracic aorta. Hepatobiliary: No focal liver abnormality is seen. The 2.6 cm low-attenuation liver lesion seen within the region of the caudate lobe on the prior study is not clearly identified on the current exam. Mild perihepatic fluid is seen within this region as well as the adjacent portion of the anterior right lobe of the liver. This represents a new finding when compared to the prior exam. Subcentimeter gallstones are seen within the gallbladder lumen without evidence of gallbladder wall thickening or biliary dilatation. Pancreas: A 9.6 cm x 5.5 cm well-defined area of heterogeneous low attenuation is again seen within the tail of the pancreas. Spleen: Normal in size without focal abnormality. Adrenals/Urinary Tract: Adrenal glands are unremarkable. Kidneys are normal, without renal calculi or hydronephrosis. A 3.2  cm simple cyst is seen within the posterior aspect of the lower pole of the right kidney. Bladder is unremarkable. Stomach/Bowel: There is mild thickening of the gastric antrum. Appendix appears normal. No evidence of bowel dilatation. Vascular/Lymphatic: Moderate to marked severity aortic calcification and atherosclerosis is seen with chronic, partially calcified dissection extending along the length of the abdominal aorta to the level of the bifurcation. No enlarged abdominal or pelvic lymph nodes. Mild right upper quadrant inflammatory fat stranding is seen within the region in between the gastric antrum/proximal duodenum and the right lobe of the liver. This extends inferiorly along the lateral aspect of the mid to upper right abdomen and represents a new finding when compared to the prior study. Reproductive: Prostate is unremarkable. Other: A mild-to-moderate amount of posterior pelvic free fluid is seen. Musculoskeletal: Degenerative changes seen throughout the lumbar spine. IMPRESSION: 1. Cholelithiasis without evidence of acute cholecystitis. 2. 9.6 cm x 5.5 cm well-defined area of heterogeneous low attenuation is again seen within the tail of the pancreas. Further evaluation with MRI is recommended. 3. Interval development of mild right upper quadrant inflammatory fat stranding since the prior study. This may represent sequelae associated with mild gastritis within the region of the gastric antrum. 4. Moderate to marked severity aortic calcification and atherosclerosis with chronic, partially calcified dissection extending along the length of the abdominal aorta to the level of the  bifurcation. 5. 3.2 cm right renal cyst. 6. Mild-to-moderate amount of posterior pelvic free fluid, increased in severity when compared to the prior study. Aortic Atherosclerosis (ICD10-I70.0). Electronically Signed   By: Virgina Norfolk M.D.   On: 05/29/2019 20:12   US Abdomen Limited RUQ  Result Date: 05/29/2019 CLINICAL  DATA:  Right upper quadrant pain. EXAM: ULTRASOUND ABDOMEN LIMITED RIGHT UPPER QUADRANT COMPARISON:  None. FINDINGS: Gallbladder: Multiple subcentimeter echogenic gallstones are seen within the gallbladder lumen without evidence of gallbladder wall thickening (1.2 mm). A mild amount of echogenic gallbladder sludge is also seen. A small amount of pericholecystic fluid is seen. No sonographic Murphy sign noted by sonographer. Common bile duct: Diameter: 3.8 mm proximally with limited visualization distally. Liver: No focal lesion identified. Diffusely increased echogenicity of the liver parenchyma is noted. Portal vein is patent on color Doppler imaging with normal direction of blood flow towards the liver. Other: A mild amount of fluid is seen adjacent to the caudate lobe and left lobe of the liver. A complex right pleural effusion is also seen. IMPRESSION: 1. Cholelithiasis and gallbladder sludge without evidence of acute cholecystitis. 2. Free fluid adjacent to the caudate lobe and left lobe of the liver with a complex right pleural effusion. Electronically Signed   By: Virgina Norfolk M.D.   On: 05/29/2019 21:08    Anti-infectives (From admission, onward)   None      Assessment/Plan Hx TIA/CVA HTN HLD GERD COPD Hx of Aortic Aneurysm Repair and AVR on Coumadin - INR 4.1  Abdominal Pain, nausea Cholelithiasis  - CT scan, RUQ [place] and MRI without definitive evidence of acute cholecystitis on imaging.  - LFT's are not elevated on today's labs - Lipase slightly elevated 4/25 at 71 - WBC ~13.0 since admit. Suspect the mild gallbladder wall thickening seen on MRI as well as the small amount of pericholecystic fluid seen on US imaging is related to the complex pancreatic pseudocyst as noted below. There are some characteristics of his story that fit with biliary colic, however the location of his pain has changed with several flares since onset and not entirely typical of pain caused by that of  Cholelithiasis/Cholecystitis. Will discuss with MD if any additional workup is indicated at this time to rule out Cholecystitis. No indication for emergency surgery or abx at this time. Further recs to follow. We will follow with you.   Pancreatitic Mass - CT 4/25 with 9.6 cm x 5.5 cm well-defined area of heterogeneous low attenuation at the tail of the pancreas. - MRI 4/26 w/ 8.8 x 5.3 cm heterogeneous masslike lesion in the pancreatic tail is most consistent with a complex hemorrhagic pseudocyst - CA 19-9 pending - GI following. Await further recs to see if they plan to proceed with endoscopic evaluation  - Will reach out to our hepatobiliary surgeon, Dr. Barry Dienes to review the patients images and history. Further recommendations to follow.   FEN - NPO. Diet per GI. Okay for CLD from our standpoint.  VTE - SCDs, INR 4.1 ID - None  Jillyn Ledger, [facility] Surgery 05/30/2019, 9:57 AM Please see Amion for pager number during day hours 7:00am-4:30pm

## 2019-05-30 NOTE — Plan of Care (Signed)
  Problem: Education: Goal: Knowledge of General Education information will improve Description: Including pain rating scale, medication(s)/side effects and non-pharmacologic comfort measures Outcome: Progressing   Problem: Clinical Measurements: Goal: Diagnostic test results will improve Outcome: Progressing   Problem: Nutrition: Goal: Adequate nutrition will be maintained Outcome: Progressing   Problem: Elimination: Goal: Will not experience complications related to bowel motility Outcome: Progressing   Problem: Pain Managment: Goal: General experience of comfort will improve Outcome: Progressing

## 2019-05-30 NOTE — Progress Notes (Signed)
Same day note  Patient seen and examined at bedside.  Patient was admitted to the hospital for abdominal pain nausea and vomiting.  Patient states that he has severe pain especially after eating food.  He has not had 14 several days and has not moved bowels as well.  At the time of my evaluation, patient complains of abdominal pain.  No nausea vomiting.  Denies any fever, chills or rigor.  Denies shortness of breath cough fever.  Physical examination reveals thinly built male with epigastric upper abdominal tenderness on palpation.  Cardiac auscultation with mechanical click noted.  Laboratory data and imaging was reviewed  Assessment and Plan.  Abdominal pain -intractable.  Continue IV fluids analgesia currently n.p.o.  GI consult has been requested.  MRI of the abdomen shows complex hemorrhagic cyst.  I also spoke with surgery Dr. Ninfa Linden for consultation due to cholelithiasis and some gallbladder wall thickening.  No indication for antibiotic at this time.  Lipase was 71.  Complex hemorrhagic cyst of the pancreas.  As per MRI.  GI and surgery on board.  Focus on analgesia.  Keep n.p.o. for now.  Cholelithiasis with gallbladder sludge - CT scan and ultrasound performed.  MRI abdomen shows mild gallbladder wall thickening and inflammation.  With general surgery for consultation.  Supratherapeutic INR.  Was on warfarin.  History of aortic valve replacement.  Could transition to heparin IV if surgery warranted.  INR of 4.1 this morning.  History of aortic valve replacement, stroke.  Continue as outpatient.  Will hold for now could consider heparin drip if INR trends down.  COPD.  Currently compensated.  Hyperglycemia.  Patient denies history of diabetes but his last A1c in 2018 was 6.1.  Check A1c.  Could be reactive.  Consider sliding scale insulin for now.  Family communication.  Spoke with the patient at bedside.  When asked, he stated that he was going to talk to his spouse.  No  Charge  Signed,  Delila Pereyra, MD [facility]

## 2019-05-30 NOTE — Care Management Important Message (Signed)
Important Message  Patient Details  Name: Alexander Barton MRN: [id number] Date of Birth: 11/10/1950   Medicare Important Message Given:  Yes     Curlene Labrum, RN 05/30/2019, 3:38 PM

## 2019-05-30 NOTE — H&P (Signed)
History and Physical    Alexander Barton [id number] DOB: October 28, 1950 DOA: 05/29/2019  PCP: Leone Haven, MD (Confirm with patient/family/NH records and if not entered, this has to be entered at [facility] point of entry) Patient coming from: Home  I have personally briefly reviewed patient's old medical records in [place]  Chief Complaint: Intractable abdominal pain  HPI: Alexander Barton is a 68 y.o. male with medical history significant of hypertension, hyperlipidemia, aortic valve replacement, stroke and COPD presented to ED for evaluation of worsening nominal pain with nausea and vomiting.  Patient states that the pain is ongoing for more than 2 weeks but continue to worsen since last week.  Pain is located mainly in epigastric region but become generalized and upper abdomen and is related with severe nausea and vomiting patient further mentioned that he is unable to eat and drink for the last few days because of worsening nausea and vomiting.  Patient further mentioned that he had a CT scan last week and it showed a new pancreatic mass.  Patient is scheduled for gastroenterology appointment on Tuesday.  Patient otherwise denies fever, chills, chest pain, shortness of breath and urinary symptoms.    ED Course: On arrival to ED patient had temperature of 98.4, blood pressure 101/53, heart rate 90, respiratory rate 16 and oxygen saturation 96% on room air.  Blood work showed WBC 12.9, hemoglobin 10, platelet 347, BUN 20, creatinine 1.3 and blood glucose 241.  CT abdomen/pelvis showed cholelithiasis without evidence of acute cholecystitis.  CT abdomen also showed well-defined area of heterogeneous low-attenuation seen within the tail of pancreas.  8 with IV fluids, IV fentanyl, famotidine and Zofran in the ED.  Physician also contacted gastroenterology and they recommended MR abdomen with and without contrast.  Review of Systems: As per HPI otherwise 10 point review of systems  negative.    Past Medical History:  Diagnosis Date  . Aortic aneurysm, thoracic ([place])   . Aortic valve disease   . Arthritis   . BPH (benign prostatic hyperplasia)   . COPD (chronic obstructive pulmonary disease) ([place])   . GERD (gastroesophageal reflux disease)   . HTN (hypertension)   . Hypercholesterolemia   . Stroke ([place])   . TIA (transient ischemic attack)     Past Surgical History:  Procedure Laterality Date  . ACHILLES TENDON REPAIR    . AORTIC VALVE REPLACEMENT     #23 On-X valve conduit  . ASCENDING AORTIC ANEURYSM REPAIR    . BACK SURGERY    . FOOT SURGERY       reports that he has never smoked. He has never used smokeless tobacco. He reports that he does not drink alcohol or use drugs.  Allergies  Allergen Reactions  . Codeine Anaphylaxis  . Gadolinium Derivatives Other (See Comments)    Other Reaction: intense sneezing MRI contrast  . Hydrocodone Anaphylaxis  . Iodinated Diagnostic Agents Other (See Comments)    Uncontrollable sneezing, needs sedative the night before and hour and benadryl before using Other Reaction: sneezing with SOB after prep  . Statins Other (See Comments)    Muscle spasm Muscle spasm  Muscle spasm  Myalgias  . Hydromorphone Other (See Comments)    Pt says he has never taken  . Lipitor [Atorvastatin Calcium] Other (See Comments)    Myalgias   . Other     Hydromorphone  . Seroquel [Quetiapine Fumerate] Other (See Comments)    "bad trip" tongue swell    Family  History  Problem Relation Age of Onset  . Alzheimer's disease Father   . Diabetes Father        age onset DM  . Hypertension Sister   . Hypertension Brother   . Diabetes Mother   . Diabetes Sister   . Colon cancer Neg Hx      Prior to Admission medications   Medication Sig Start Date End Date Taking? Authorizing Provider  albuterol (VENTOLIN HFA) 108 (90 Base) MCG/ACT inhaler Inhale 2 puffs into the lungs every 4 (four) hours as needed for wheezing or  shortness of breath. 09/10/18   Leone Haven, MD  aspirin 81 MG tablet Take 81 mg by mouth at bedtime.     [provider]  benzonatate (TESSALON) 100 MG capsule Take 1 or 2 capsules every 8 hours as needed for cough 03/29/18   Guse, Jacquelynn Cree, FNP  budesonide-formoterol (SYMBICORT) 160-4.5 MCG/ACT inhaler TAKE 2 PUFFS BY MOUTH TWICE A DAY 06/11/18   Flora Lipps, MD  furosemide (LASIX) 20 MG tablet TAKE 1/2 TABLET (10 MG) 1 OR 2 TIMES A DAY AS NEEDED FOR LEG SWELLING 09/01/18   Leone Haven, MD  metoprolol succinate (TOPROL XL) 25 MG 24 hr tablet Take 1 tablet (25 mg total) by mouth daily. 02/01/19   Hilty, Nadean Corwin, MD  NON FORMULARY     [provider]  Polyethylene Glycol 3350 (MIRALAX PO) Take by mouth.    [provider]  predniSONE (DELTASONE) 50 MG tablet Take 1 tablet (50 mg) by mouth 13 hours prior to your scan, then take 1 tablet (50 mg) by mouth 7 hours prior to scan, then take 1 tablet (50 mg) by mouth 1 hour prior to your scan 05/25/19   Leone Haven, MD  senna-docusate (SENOKOT-S) 8.6-50 MG tablet Take by mouth. 10/25/17   [provider]  sulfamethoxazole-trimethoprim (BACTRIM DS) 800-160 MG tablet Take 1 tablet by mouth 2 (two) times daily. 01/13/19   Leone Haven, MD  tamsulosin (FLOMAX) 0.4 MG CAPS capsule TAKE 2 CAPSULES BY MOUTH EVERY DAY 04/05/19   Leone Haven, MD  Tiotropium Bromide Monohydrate (SPIRIVA RESPIMAT) 2.5 MCG/ACT AERS INHALE 2.5 MCG INTO THE LUNGS 2 (TWO) TIMES DAILY. 06/11/18   Flora Lipps, MD  warfarin (COUMADIN) 2 MG tablet TAKE 1 TABLET WITH 5 MG TABLET DAILY AS DIRECTED BY COUMADIN CLINIC 05/16/19   [name], [name] M, MD  warfarin (COUMADIN) 5 MG tablet TAKE 1 TABLET DAILY AS DIRECTED WITH 2 MG TABLET 01/24/19   [name], [name] M, MD    Physical Exam: Vitals:   05/30/19 0145 05/30/19 0400 05/30/19 0430 05/30/19 0500  BP: 116/63 135/72 124/70 137/68  Pulse: 96 (!) 109 (!) 108 (!) 106  Resp: (!) 22  (!) 26  (!) 26  Temp:      TempSrc:      SpO2: 91% 92% 92% 92%    Constitutional: NAD, calm, comfortable Vitals:   05/30/19 0145 05/30/19 0400 05/30/19 0430 05/30/19 0500  BP: 116/63 135/72 124/70 137/68  Pulse: 96 (!) 109 (!) 108 (!) 106  Resp: (!) 22  (!) 26 (!) 26  Temp:      TempSrc:      SpO2: 91% 92% 92% 92%   Eyes: PERRL, lids and conjunctivae normal ENMT: Mucous membranes are moist. Posterior pharynx clear of any exudate or lesions.Normal dentition.  Neck: normal, supple, no masses, no thyromegaly Respiratory: clear to auscultation bilaterally, no wheezing, no crackles. Normal respiratory effort. No  accessory muscle use.  Cardiovascular: Regular rate and rhythm, no murmurs / rubs / gallops. No extremity edema. 2+ pedal pulses. No carotid bruits.  Abdomen: Severe tenderness in the epigastric region with no distention of abdomen, no masses palpated. No hepatosplenomegaly. Bowel sounds positive.  Musculoskeletal: no clubbing / cyanosis. No joint deformity upper and lower extremities. Good ROM, no contractures. Normal muscle tone.  Skin: no rashes, lesions, ulcers. No induration Neurologic: CN 2-12 grossly intact. Sensation intact, DTR normal. Strength 5/5 in all 4.  Psychiatric: Normal judgment and insight. Alert and oriented x 3. Normal mood.   (Anything < 9 systems with 2 bullets each down codes to level 1) (If patient refuses exam can't bill higher level) (Make sure to document decubitus ulcers present on admission -- if possible -- and whether patient has chronic indwelling catheter at time of admission)  Labs on Admission: I have personally reviewed following labs and imaging studies  CBC: Recent Labs  Lab 05/25/19 1140 05/29/19 1428 05/30/19 0058 05/30/19 0410  WBC 5.9 12.9* 13.1* 13.0*  NEUTROABS 4.1  --   --   --   HGB 9.6* 10.0* 8.5* 8.8*  HCT 28.2* 31.1* 26.9* 27.6*  MCV 86.5 87.9 89.7 88.5  PLT 248.0 347 302 [id number]   Basic Metabolic Panel: Recent Labs  Lab  05/25/19 1140 05/29/19 1428 05/30/19 0058 05/30/19 0410  NA 133* 133*  --  134*  K 3.9 4.9  --  4.5  CL 98 97*  --  102  CO2 27 24  --  22  GLUCOSE 184* 241*  --  245*  BUN 17 20  --  18  CREATININE 1.10 1.30* 1.14 1.14  CALCIUM 9.1 9.2  --  8.2*   GFR: Estimated Creatinine Clearance: 68.1 mL/min (by C-G formula based on SCr of 1.14 mg/dL). Liver Function Tests: Recent Labs  Lab 05/25/19 1140 05/29/19 1428 05/30/19 0410  AST 17 19 14*  ALT 10 14 14   ALKPHOS 74 71 57  BILITOT 0.9 1.7* 1.1  PROT 7.5 7.5 6.4*  ALBUMIN 3.9 3.0* 2.4*   Recent Labs  Lab 05/25/19 1140 05/29/19 1428  LIPASE 59.0 71*   No results for input(s): AMMONIA in the last 168 hours. Coagulation Profile: Recent Labs  Lab 05/30/19 0119  INR 4.1*   Cardiac Enzymes: No results for input(s): CKTOTAL, CKMB, CKMBINDEX, TROPONINI in the last 168 hours. BNP (last 3 results) No results for input(s): PROBNP in the last 8760 hours. HbA1C: No results for input(s): HGBA1C in the last 72 hours. CBG: No results for input(s): GLUCAP in the last 168 hours. Lipid Profile: No results for input(s): CHOL, HDL, LDLCALC, TRIG, CHOLHDL, LDLDIRECT in the last 72 hours. Thyroid Function Tests: No results for input(s): TSH, T4TOTAL, FREET4, T3FREE, THYROIDAB in the last 72 hours. Anemia Panel: Recent Labs    05/27/19 1514 05/27/19 1517  VITAMINB12 684  --   FOLATE >24.0  --   FERRITIN 243  --   TIBC  --  251  IRON 15* 13*   Urine analysis:    Component Value Date/Time   COLORURINE YELLOW 05/29/2019 2120   APPEARANCEUR HAZY (A) 05/29/2019 2120   LABSPEC >1.046 (H) 05/29/2019 2120   PHURINE 5.0 05/29/2019 2120   GLUCOSEU 50 (A) 05/29/2019 2120   HGBUR NEGATIVE 05/29/2019 2120   BILIRUBINUR NEGATIVE 05/29/2019 2120   BILIRUBINUR moderate 01/11/2019 1302   KETONESUR 5 (A) 05/29/2019 2120   PROTEINUR 100 (A) 05/29/2019 2120   UROBILINOGEN 4.0 (A) 01/11/2019  1302   UROBILINOGEN 0.2 06/04/2012 0027    NITRITE NEGATIVE 05/29/2019 2120   LEUKOCYTESUR NEGATIVE 05/29/2019 2120    Radiological Exams on Admission: CT ABDOMEN PELVIS W CONTRAST  Result Date: 05/29/2019 CLINICAL DATA:  Epigastric pain and nausea x4 weeks. EXAM: CT ABDOMEN AND PELVIS WITH CONTRAST TECHNIQUE: Multidetector CT imaging of the abdomen and pelvis was performed using the standard protocol following bolus administration of intravenous contrast. CONTRAST:  198mL OMNIPAQUE IOHEXOL 300 MG/ML  SOLN COMPARISON:  May 26, 2019 FINDINGS: Lower chest: Multiple sternal wires are seen. Mild areas of atelectasis and/or scarring are seen within the bilateral lung bases. There is stenting of the visualized portion of the descending thoracic aorta. Hepatobiliary: No focal liver abnormality is seen. The 2.6 cm low-attenuation liver lesion seen within the region of the caudate lobe on the prior study is not clearly identified on the current exam. Mild perihepatic fluid is seen within this region as well as the adjacent portion of the anterior right lobe of the liver. This represents a new finding when compared to the prior exam. Subcentimeter gallstones are seen within the gallbladder lumen without evidence of gallbladder wall thickening or biliary dilatation. Pancreas: A 9.6 cm x 5.5 cm well-defined area of heterogeneous low attenuation is again seen within the tail of the pancreas. Spleen: Normal in size without focal abnormality. Adrenals/Urinary Tract: Adrenal glands are unremarkable. Kidneys are normal, without renal calculi or hydronephrosis. A 3.2 cm simple cyst is seen within the posterior aspect of the lower pole of the right kidney. Bladder is unremarkable. Stomach/Bowel: There is mild thickening of the gastric antrum. Appendix appears normal. No evidence of bowel dilatation. Vascular/Lymphatic: Moderate to marked severity aortic calcification and atherosclerosis is seen with chronic, partially calcified dissection extending along the length of  the abdominal aorta to the level of the bifurcation. No enlarged abdominal or pelvic lymph nodes. Mild right upper quadrant inflammatory fat stranding is seen within the region in between the gastric antrum/proximal duodenum and the right lobe of the liver. This extends inferiorly along the lateral aspect of the mid to upper right abdomen and represents a new finding when compared to the prior study. Reproductive: Prostate is unremarkable. Other: A mild-to-moderate amount of posterior pelvic free fluid is seen. Musculoskeletal: Degenerative changes seen throughout the lumbar spine. IMPRESSION: 1. Cholelithiasis without evidence of acute cholecystitis. 2. 9.6 cm x 5.5 cm well-defined area of heterogeneous low attenuation is again seen within the tail of the pancreas. Further evaluation with MRI is recommended. 3. Interval development of mild right upper quadrant inflammatory fat stranding since the prior study. This may represent sequelae associated with mild gastritis within the region of the gastric antrum. 4. Moderate to marked severity aortic calcification and atherosclerosis with chronic, partially calcified dissection extending along the length of the abdominal aorta to the level of the bifurcation. 5. 3.2 cm right renal cyst. 6. Mild-to-moderate amount of posterior pelvic free fluid, increased in severity when compared to the prior study. Aortic Atherosclerosis (ICD10-I70.0). Electronically Signed   By: Virgina Norfolk M.D.   On: 05/29/2019 20:12   US Abdomen Limited RUQ  Result Date: 05/29/2019 CLINICAL DATA:  Right upper quadrant pain. EXAM: ULTRASOUND ABDOMEN LIMITED RIGHT UPPER QUADRANT COMPARISON:  None. FINDINGS: Gallbladder: Multiple subcentimeter echogenic gallstones are seen within the gallbladder lumen without evidence of gallbladder wall thickening (1.2 mm). A mild amount of echogenic gallbladder sludge is also seen. A small amount of pericholecystic fluid is seen. No sonographic Murphy sign  noted  by sonographer. Common bile duct: Diameter: 3.8 mm proximally with limited visualization distally. Liver: No focal lesion identified. Diffusely increased echogenicity of the liver parenchyma is noted. Portal vein is patent on color Doppler imaging with normal direction of blood flow towards the liver. Other: A mild amount of fluid is seen adjacent to the caudate lobe and left lobe of the liver. A complex right pleural effusion is also seen. IMPRESSION: 1. Cholelithiasis and gallbladder sludge without evidence of acute cholecystitis. 2. Free fluid adjacent to the caudate lobe and left lobe of the liver with a complex right pleural effusion. Electronically Signed   By: Virgina Norfolk M.D.   On: 05/29/2019 21:08      Assessment/Plan Principal Problem:   Intractable abdominal pain Continue gentle hydration with IV normal saline at a rate of 100 mL/h. Fentanyl 100 mg every 4 hours as needed N.p.o. IV Zofran for nausea and vomiting GI consult ordered by ED physician and will appreciate their recommendations. MR abdomen with and without contrast ordered.  Active Problems:   Nausea and vomiting Zofran as needed for nausea and vomiting    Pancreatic mass MR abdomen with and without contrast ordered as per gastroenterology recommendation. GI consult ordered  Supratherapeutic INR: Patient has INR greater than 4 We will hold heparin for DVT prophylaxis at this time. Continue to monitor INR.    Hyperglycemia Low-dose sliding scale insulin ordered Blood glucose monitoring and hypoglycemic protocol in place   DVT prophylaxis: No chemical prophylaxis because of supratherapeutic INR Code Status: Full code Family Communication:  Disposition Plan:  Consults called: Gastroenterology consult ordered by ED physician Admission status: Observation/MedSurg   Edmonia Lynch MD [facility] Pager [phone]   If 7PM-7AM, please contact night-coverage [email or web address] Password    05/30/2019, 5:43 AM

## 2019-05-30 NOTE — Telephone Encounter (Signed)
[place] Night - Cl TELEPHONE ADVICE RECORD [facility] Patient Name: Alexander Barton Gender: Male DOB: [birth year] Age: 69 Y 69 M 41 D Return Phone Number: [phone] (Primary), [phone] (Secondary) Address: City/State/Zip: [place] [address] Client [facility] Night - Cl Client Site [place] Physician [name] - MD Contact Type Call Who Is Calling Patient / Member / Family / Caregiver Call Type Triage / Clinical Caller Name Shanga Shagena Relationship To Patient Spouse Return Phone Number [phone] (Primary) Chief Complaint ABDOMINAL PAIN - Severe and only in abdomen Reason for Call Symptomatic / Request for [place] states husband is having severe abdominal pain. Asking if there is something to relieve this pain. Translation No Nurse Assessment Nurse: Jerene Bears, RN, Will Date/Time Eilene Ghazi Time): 05/29/2019 9:31:11 AM Confirm and document reason for call. If symptomatic, describe symptoms. ---Caller reports that her husband was seen Wednesday 4/21. Known pancreatic enlargement, known gall stones. Appointment with GI MD this coming week. Upper right abdominal pain that occasionally radiates to back. Fever denied, 97.3 forehead currently. Nausea with no vomiting. Has the patient had close contact with a person known or suspected to have the novel coronavirus illness OR traveled / lives in area with major community spread (including international travel) in the last 14 days from the onset of symptoms? * If Asymptomatic, screen for exposure and travel within the last 14 days. ---No Does the patient have any new or worsening symptoms? ---Yes Will a triage be completed? ---Yes Related visit to physician within the last 2 weeks? ---Yes Does the PT have any chronic conditions? (i.e. diabetes, asthma, this includes High risk factors  for pregnancy, etc.) ---Yes List chronic conditions. ---mechanical heart valve, h/o dissection aortic aneurysm, cardiac stent placement Is this a behavioral health or substance abuse call? ---NoPLEASE NOTE: All timestamps contained within this report are represented as [place] Standard Time. CONFIDENTIALTY NOTICE: This fax transmission is intended only for the addressee. It contains information that is legally privileged, confidential or otherwise protected from use or disclosure. If you are not the intended recipient, you are strictly prohibited from reviewing, disclosing, copying using or disseminating any of this information or taking any action in reliance on or regarding this information. If you have received this fax in error, please notify us immediately by telephone so that we can arrange for its return to [place]. Phone: [phone], Toll-Free: [phone], Fax: [phone] Page: 2 of 2 Call Id: [id number] Guidelines Guideline Title Affirmed Question Affirmed Notes Nurse Date/Time Eilene Ghazi Time) Abdominal Pain - Upper Visible sweat on face or sweat dripping down face Jerene Bears, RN, Will 05/29/2019 9:36:34 AM Disp. Time Eilene Ghazi Time) Disposition Final User 05/29/2019 9:30:02 AM Send to Urgent Janetta Hora 05/29/2019 9:39:45 AM 911 Outcome Documentation [place], RN, Will Reason: refused 911 call back 05/29/2019 9:38:30 AM Call EMS 911 Now Yes Jerene Bears, RN, Will Caller Disagree/Comply Comply Caller Understands Yes PreDisposition Did not know what to do Care Advice Given Per Guideline CALL EMS 911 NOW: CARE ADVICE given per Abdominal Pain, Upper (Adult) guideline. Referrals GO TO FACILITY UNDECIDED

## 2019-05-30 NOTE — Progress Notes (Signed)
IR requested by Ellouise Newer, PA-C for management of pancreatic pseudocyst.  Case and images have been reviewed by Dr. Pascal Lux who does not recommend drainage at this time, given INR 4.1. Does recommend multiphase CT (noncontrast, arterial and delayed venous phase) if intervention is warranted to ensure there is not an arterial/venous injury (given history of known chronic aortic dissection). Awaiting further recommendations from [place] (Dr. Barry Dienes). IR to follow.  Please call IR with questions/concerns.   Bea Graff Denica Web, PA-C 05/30/2019, 12:43 PM

## 2019-05-30 NOTE — ED Notes (Signed)
Pt is transferring from [facility] to [facility]. Pt has not returned from CT.

## 2019-05-30 NOTE — Consult Note (Signed)
Consultation  Referring Provider: Dr. Louanne Belton   Primary Care Physician:  Leone Haven, MD Primary Gastroenterologist: Dr. Carlean Purl        Reason for Consultation: Abnormal CT of the abdomen, abdominal pain             HPI:   Alexander Barton is a 69 y.o. male with a past medical history as below including aortic valve replacement on Coumadin, stroke and COPD, who presented to the ED on 05/29/2019 with a complaint of worsening abdominal pain with nausea and vomiting.    Today, the patient explains that about a month ago he had "an attack" of abdominal pain which was mostly in his left lower quadrant, this happened after eating some food and was pretty much instantaneous, he describes it is a cramping/sharp pain which then started to migrate around his abdomen, this lasted for a few hours and then went away.  He had been perfectly fine for the past 3 weeks until about a week ago when he had another attack of pain but "this was completely different".  Tells me that the pain from this was "close to the pain I had when I had a dissecting aortic aneurysm".  This started on the right side of his abdomen and seemed to radiate laterally up to his right shoulder.  This continued for the next few days and they went to their primary care provider who ordered a CT.  He tells me the CT was noncontrasted and "really did not show anything".  The pain continued, worse when he tried to eat anything, even just a spoonful of jello, or moved at all.  Bowel movements have been normal for him.  He is on MiraLAX once a day and 2 Senokot twice daily ever since his aortic dissection because he was told that if he strained it could be "fatal".  He has been completely unable to eat because of the pain that ensues.  Currently his pain is a 9-10/10.    Denies fever, blood in his stool, chills or weight loss.  ED course: WBC 12.9, hemoglobin 10, platelet 347, BUN 20, creatinine 1.3; CT abdomen pelvis showed  cholelithiasis without evidence of acute cholecystitis, also showed well-defined area of heterogenous low-attenuation within the tail the pancreas  GI history: 10/12/2014 colonoscopy with Dr. Carlean Purl: Completely normal, repeat recommended in 10 years  Past Medical History:  Diagnosis Date  . Aortic aneurysm, thoracic ([place])   . Aortic valve disease   . Arthritis   . BPH (benign prostatic hyperplasia)   . COPD (chronic obstructive pulmonary disease) ([place])   . GERD (gastroesophageal reflux disease)   . HTN (hypertension)   . Hypercholesterolemia   . Stroke ([place])   . TIA (transient ischemic attack)     Past Surgical History:  Procedure Laterality Date  . ACHILLES TENDON REPAIR    . AORTIC VALVE REPLACEMENT     #23 On-X valve conduit  . ASCENDING AORTIC ANEURYSM REPAIR    . BACK SURGERY    . FOOT SURGERY      Family History  Problem Relation Age of Onset  . Alzheimer's disease Father   . Diabetes Father        age onset DM  . Hypertension Sister   . Hypertension Brother   . Diabetes Mother   . Diabetes Sister   . Colon cancer Neg Hx     Social History   Tobacco Use  . Smoking status: Never Smoker  .  Smokeless tobacco: Never Used  Substance Use Topics  . Alcohol use: No    Alcohol/week: 0.0 standard drinks  . Drug use: No    Prior to Admission medications   Medication Sig Start Date End Date Taking? Authorizing Provider  albuterol (VENTOLIN HFA) 108 (90 Base) MCG/ACT inhaler Inhale 2 puffs into the lungs every 4 (four) hours as needed for wheezing or shortness of breath. 09/10/18   Leone Haven, MD  aspirin 81 MG tablet Take 81 mg by mouth at bedtime.     [provider]  benzonatate (TESSALON) 100 MG capsule Take 1 or 2 capsules every 8 hours as needed for cough 03/29/18   Guse, Jacquelynn Cree, FNP  budesonide-formoterol (SYMBICORT) 160-4.5 MCG/ACT inhaler TAKE 2 PUFFS BY MOUTH TWICE A DAY 06/11/18   Flora Lipps, MD  furosemide (LASIX) 20 MG tablet TAKE 1/2  TABLET (10 MG) 1 OR 2 TIMES A DAY AS NEEDED FOR LEG SWELLING 09/01/18   Leone Haven, MD  metoprolol succinate (TOPROL XL) 25 MG 24 hr tablet Take 1 tablet (25 mg total) by mouth daily. 02/01/19   Hilty, Nadean Corwin, MD  NON FORMULARY     [provider]  Polyethylene Glycol 3350 (MIRALAX PO) Take by mouth.    [provider]  predniSONE (DELTASONE) 50 MG tablet Take 1 tablet (50 mg) by mouth 13 hours prior to your scan, then take 1 tablet (50 mg) by mouth 7 hours prior to scan, then take 1 tablet (50 mg) by mouth 1 hour prior to your scan 05/25/19   Leone Haven, MD  senna-docusate (SENOKOT-S) 8.6-50 MG tablet Take by mouth. 10/25/17   [provider]  sulfamethoxazole-trimethoprim (BACTRIM DS) 800-160 MG tablet Take 1 tablet by mouth 2 (two) times daily. 01/13/19   Leone Haven, MD  tamsulosin (FLOMAX) 0.4 MG CAPS capsule TAKE 2 CAPSULES BY MOUTH EVERY DAY 04/05/19   Leone Haven, MD  Tiotropium Bromide Monohydrate (SPIRIVA RESPIMAT) 2.5 MCG/ACT AERS INHALE 2.5 MCG INTO THE LUNGS 2 (TWO) TIMES DAILY. 06/11/18   Flora Lipps, MD  warfarin (COUMADIN) 2 MG tablet TAKE 1 TABLET WITH 5 MG TABLET DAILY AS DIRECTED BY COUMADIN CLINIC 05/16/19   [name], [name] M, MD  warfarin (COUMADIN) 5 MG tablet TAKE 1 TABLET DAILY AS DIRECTED WITH 2 MG TABLET 01/24/19   [name], [name] M, MD    Current Facility-Administered Medications  Medication Dose Route Frequency Provider Last Rate Last Admin  . 0.9 %  sodium chloride infusion   Intravenous Continuous Bunnie Pion Z, DO 100 mL/hr at 05/30/19 0121 New Bag at 05/30/19 0121  . acetaminophen (TYLENOL) tablet 650 mg  650 mg Oral Q6H PRN Bunnie Pion Z, DO       Or  . acetaminophen (TYLENOL) suppository 650 mg  650 mg Rectal Q6H PRN Bunnie Pion Z, DO      . fentaNYL (SUBLIMAZE) injection 100 mcg  100 mcg Intravenous Q4H PRN Bunnie Pion Z, DO   100 mcg at 05/30/19 0355  . insulin aspart (novoLOG) injection 0-5 Units   0-5 Units Subcutaneous QHS Bunnie Pion Z, DO      . insulin aspart (novoLOG) injection 0-9 Units  0-9 Units Subcutaneous TID WC Humphrey Rolls, Mohammad Z, DO      . ondansetron [facility]) tablet 4 mg  4 mg Oral Q6H PRN Bunnie Pion Z, DO       Or  . ondansetron [facility]) injection 4 mg  4 mg Intravenous Q6H PRN  Bunnie Pion Z, DO      . sodium chloride flush (NS) 0.9 % injection 3 mL  3 mL Intravenous Once Deno Etienne, DO        Allergies as of 05/29/2019 - Review Complete 05/29/2019  Allergen Reaction Noted  . Codeine Anaphylaxis 12/06/2010  . Gadolinium derivatives Other (See Comments) 06/11/2017  . Hydrocodone Anaphylaxis 12/06/2010  . Iodinated diagnostic agents Other (See Comments) 12/06/2010  . Statins Other (See Comments) 12/10/2010  . Hydromorphone Other (See Comments) 12/10/2010  . Lipitor [atorvastatin calcium] Other (See Comments) 12/10/2010  . Other  12/06/2010  . Seroquel [quetiapine fumerate] Other (See Comments) 12/06/2010     Review of Systems:    Constitutional: No weight loss, fever or chills Skin: No rash Cardiovascular: No chest pain   Respiratory: No SOB  Gastrointestinal: See HPI and otherwise negative Genitourinary: No dysuria Neurological: No headache, dizziness or syncope Musculoskeletal: No new muscle or joint pain Hematologic: No bleeding  Psychiatric: No history of depression or anxiety    Physical Exam:  Vital signs in last 24 hours: Temp:  [98.4 F (36.9 C)-98.5 F (36.9 C)] 98.5 F (36.9 C) (04/26 0655) Pulse Rate:  [90-109] 109 (04/26 0655) Resp:  [16-27] 20 (04/26 0655) BP: (101-148)/(53-72) 131/70 (04/26 0655) SpO2:  [91 %-98 %] 94 % (04/26 0655) Weight:  [97.6 kg] 97.6 kg (04/26 0626) Last BM Date: 05/30/19 General:   Pleasant Caucasian male appears to be in NAD but uncomfortable, Well developed, Well nourished, alert and cooperative Head:  Normocephalic and atraumatic. Eyes:   PEERL, EOMI. No icterus. Conjunctiva pink. Ears:  Normal  auditory acuity. Neck:  Supple Throat: Oral cavity and pharynx without inflammation, swelling or lesion. Teeth in good condition. Lungs: Respirations even and unlabored. Lungs clear to auscultation bilaterally.   No wheezes, crackles, or rhonchi.  Heart: Normal S1, S2. No MRG. Regular rate and rhythm. No peripheral edema, cyanosis or pallor.  Abdomen:  Soft, nondistended, marked to any palpation of the abdomen, ?worse in RLQ and RUQ?, with involuntary guarding. Normal bowel sounds. No appreciable masses or hepatomegaly. Rectal:  Not performed.  Msk:  Symmetrical without gross deformities. Peripheral pulses intact.  Extremities:  Without edema, no deformity or joint abnormality.  Neurologic:  Alert and  oriented x4;  grossly normal neurologically.  Skin:   Dry and intact without significant lesions or rashes. Psychiatric: Demonstrates good judgement and reason without abnormal affect or behaviors.   LAB RESULTS: Recent Labs    05/29/19 1428 05/30/19 0058 05/30/19 0410  WBC 12.9* 13.1* 13.0*  HGB 10.0* 8.5* 8.8*  HCT 31.1* 26.9* 27.6*  PLT 347 302 311   BMET Recent Labs    05/29/19 1428 05/30/19 0058 05/30/19 0410  NA 133*  --  134*  K 4.9  --  4.5  CL 97*  --  102  CO2 24  --  22  GLUCOSE 241*  --  245*  BUN 20  --  18  CREATININE 1.30* 1.14 1.14  CALCIUM 9.2  --  8.2*   LFT Recent Labs    05/30/19 0410  PROT 6.4*  ALBUMIN 2.4*  AST 14*  ALT 14  ALKPHOS 57  BILITOT 1.1   PT/INR Recent Labs    05/30/19 0119  LABPROT 39.6*  INR 4.1*    STUDIES: MR Abdomen W or Wo Contrast  Result Date: 05/30/2019 CLINICAL DATA:  Pancreatic tail mass. Possible hemorrhagic pseudocyst EXAM: MRI ABDOMEN WITHOUT AND WITH CONTRAST TECHNIQUE: Multiplanar multisequence MR imaging of  the abdomen was performed both before and after the administration of intravenous contrast. CONTRAST:  35mL GADAVIST GADOBUTROL 1 MMOL/ML IV SOLN COMPARISON:  CT scans 05/26/2019 and 05/29/2019 FINDINGS:  Lower chest: The lung bases demonstrates streaky atelectasis but no pleural or pericardial effusion. No worrisome pulmonary lesions. The distal esophagus is grossly normal. Aortic dissection is again demonstrated. Hepatobiliary: No hepatic lesions are identified. No intrahepatic biliary dilatation. The portal and hepatic veins are patent. Gallstones are noted in the gallbladder along with some layering gallbladder sludge. There is mild gallbladder wall thickening and inflammation. Normal caliber and course of the common bile duct. No common bile duct stones are identified. Pancreas: The pancreatic head is normal. No mass or acute inflammation. The main pancreatic duct is normal in caliber and has a normal course. Persistent large heterogeneous masslike lesion in the pancreatic tail measures approximately 8.8 x 5.3 cm. Heterogeneous T1 and T2 signal intensity with several areas of increased T1 signal intensity consistent with hemorrhage. No contrast enhancement is demonstrated. Findings most consistent with a complex hemorrhagic pseudocyst. Spleen:  Normal size. No focal lesions. Adrenals/Urinary Tract: The adrenal glands and kidneys are unremarkable. Simple right renal cyst is noted. Stomach/Bowel: The stomach, duodenum, visualized small bowel loops and visualized colon are unremarkable. Vascular/Lymphatic: Aortic dissection is again demonstrated which is a continuation of the thoracic aortic dissection. This continues all the way down to the bifurcation. The branch vessels are patent. Scattered small mesenteric and retroperitoneal lymph nodes. Fluid is noted along the medial liver edge and there is a small focal fluid collection along the anterior wall of the antral region of the stomach. Thin rim like enhancement is noted. This is likely a small pseudocyst. Other:  None. Musculoskeletal: Musculoskeletal no significant bony findings. IMPRESSION: 1. 8.8 x 5.3 cm heterogeneous masslike lesion in the pancreatic tail  is most consistent with a complex hemorrhagic pseudocyst. 2. Cholelithiasis with mild gallbladder wall thickening and inflammation. 3. No common bile duct stones or biliary dilatation. 4. Chronic aortic dissection.  Aortic branch vessels are patent. 5. Small fluid collection along the anterior wall of the stomach, likely a small pseudocyst. There is also fluid along the medial liver edge which does not appear organized. Electronically Signed   By: Marijo Sanes M.D.   On: 05/30/2019 08:05   CT ABDOMEN PELVIS W CONTRAST  Result Date: 05/29/2019 CLINICAL DATA:  Epigastric pain and nausea x4 weeks. EXAM: CT ABDOMEN AND PELVIS WITH CONTRAST TECHNIQUE: Multidetector CT imaging of the abdomen and pelvis was performed using the standard protocol following bolus administration of intravenous contrast. CONTRAST:  170mL OMNIPAQUE IOHEXOL 300 MG/ML  SOLN COMPARISON:  May 26, 2019 FINDINGS: Lower chest: Multiple sternal wires are seen. Mild areas of atelectasis and/or scarring are seen within the bilateral lung bases. There is stenting of the visualized portion of the descending thoracic aorta. Hepatobiliary: No focal liver abnormality is seen. The 2.6 cm low-attenuation liver lesion seen within the region of the caudate lobe on the prior study is not clearly identified on the current exam. Mild perihepatic fluid is seen within this region as well as the adjacent portion of the anterior right lobe of the liver. This represents a new finding when compared to the prior exam. Subcentimeter gallstones are seen within the gallbladder lumen without evidence of gallbladder wall thickening or biliary dilatation. Pancreas: A 9.6 cm x 5.5 cm well-defined area of heterogeneous low attenuation is again seen within the tail of the pancreas. Spleen: Normal in size without focal  abnormality. Adrenals/Urinary Tract: Adrenal glands are unremarkable. Kidneys are normal, without renal calculi or hydronephrosis. A 3.2 cm simple cyst is seen  within the posterior aspect of the lower pole of the right kidney. Bladder is unremarkable. Stomach/Bowel: There is mild thickening of the gastric antrum. Appendix appears normal. No evidence of bowel dilatation. Vascular/Lymphatic: Moderate to marked severity aortic calcification and atherosclerosis is seen with chronic, partially calcified dissection extending along the length of the abdominal aorta to the level of the bifurcation. No enlarged abdominal or pelvic lymph nodes. Mild right upper quadrant inflammatory fat stranding is seen within the region in between the gastric antrum/proximal duodenum and the right lobe of the liver. This extends inferiorly along the lateral aspect of the mid to upper right abdomen and represents a new finding when compared to the prior study. Reproductive: Prostate is unremarkable. Other: A mild-to-moderate amount of posterior pelvic free fluid is seen. Musculoskeletal: Degenerative changes seen throughout the lumbar spine. IMPRESSION: 1. Cholelithiasis without evidence of acute cholecystitis. 2. 9.6 cm x 5.5 cm well-defined area of heterogeneous low attenuation is again seen within the tail of the pancreas. Further evaluation with MRI is recommended. 3. Interval development of mild right upper quadrant inflammatory fat stranding since the prior study. This may represent sequelae associated with mild gastritis within the region of the gastric antrum. 4. Moderate to marked severity aortic calcification and atherosclerosis with chronic, partially calcified dissection extending along the length of the abdominal aorta to the level of the bifurcation. 5. 3.2 cm right renal cyst. 6. Mild-to-moderate amount of posterior pelvic free fluid, increased in severity when compared to the prior study. Aortic Atherosclerosis (ICD10-I70.0). Electronically Signed   By: Virgina Norfolk M.D.   On: 05/29/2019 20:12   US Abdomen Limited RUQ  Result Date: 05/29/2019 CLINICAL DATA:  Right upper  quadrant pain. EXAM: ULTRASOUND ABDOMEN LIMITED RIGHT UPPER QUADRANT COMPARISON:  None. FINDINGS: Gallbladder: Multiple subcentimeter echogenic gallstones are seen within the gallbladder lumen without evidence of gallbladder wall thickening (1.2 mm). A mild amount of echogenic gallbladder sludge is also seen. A small amount of pericholecystic fluid is seen. No sonographic Murphy sign noted by sonographer. Common bile duct: Diameter: 3.8 mm proximally with limited visualization distally. Liver: No focal lesion identified. Diffusely increased echogenicity of the liver parenchyma is noted. Portal vein is patent on color Doppler imaging with normal direction of blood flow towards the liver. Other: A mild amount of fluid is seen adjacent to the caudate lobe and left lobe of the liver. A complex right pleural effusion is also seen. IMPRESSION: 1. Cholelithiasis and gallbladder sludge without evidence of acute cholecystitis. 2. Free fluid adjacent to the caudate lobe and left lobe of the liver with a complex right pleural effusion. Electronically Signed   By: Virgina Norfolk M.D.   On: 05/29/2019 21:08     Impression / Plan:   Impression: 1.  Abnormal CT of the pancreas: Showed question of pancreatic mass, MRI now showing a likely hemorrhagic cyst in the tail of the pancreas, also MRI showing cholelithiasis with mild gallbladder wall thickening 2.  Nausea: With pain below 3.  Abdominal pain: Marked generalized abdominal pain, really uncertain etiology given lower abdominal pain as well as upper abdominal pain, MRI showing cholelithiasis with mild gallbladder wall thickening; question cholecystitis, the labs are normal other than leukocytosis 4.  Supratherapeutic INR: INR greater than 4  Plan: 1.  Will discuss case with Dr. Bryan Lemma.  Uncertain if we need to proceed with endoscopic  evaluation of some sort, not sure where this pain is coming from. 2.  Continue current supportive measures 3.  Patient to  remain n.p.o. for now, he does ask for water/Sprite if he is able 4.  Please await further recommendations from Dr. Bryan Lemma later today.  Thank you for your kind consultation, we will continue to follow.  Lavone Nian [facility]  05/30/2019, 8:49 AM

## 2019-05-31 ENCOUNTER — Ambulatory Visit: Payer: Medicare PPO | Admitting: Gastroenterology

## 2019-05-31 DIAGNOSIS — R197 Diarrhea, unspecified: Secondary | ICD-10-CM | POA: Diagnosis present

## 2019-05-31 DIAGNOSIS — D509 Iron deficiency anemia, unspecified: Secondary | ICD-10-CM | POA: Diagnosis present

## 2019-05-31 DIAGNOSIS — I359 Nonrheumatic aortic valve disorder, unspecified: Secondary | ICD-10-CM | POA: Diagnosis present

## 2019-05-31 DIAGNOSIS — Z8616 Personal history of COVID-19: Secondary | ICD-10-CM | POA: Diagnosis not present

## 2019-05-31 DIAGNOSIS — N400 Enlarged prostate without lower urinary tract symptoms: Secondary | ICD-10-CM | POA: Diagnosis present

## 2019-05-31 DIAGNOSIS — K8010 Calculus of gallbladder with chronic cholecystitis without obstruction: Secondary | ICD-10-CM | POA: Diagnosis present

## 2019-05-31 DIAGNOSIS — Z20822 Contact with and (suspected) exposure to covid-19: Secondary | ICD-10-CM | POA: Diagnosis present

## 2019-05-31 DIAGNOSIS — E1165 Type 2 diabetes mellitus with hyperglycemia: Secondary | ICD-10-CM | POA: Diagnosis present

## 2019-05-31 DIAGNOSIS — J449 Chronic obstructive pulmonary disease, unspecified: Secondary | ICD-10-CM | POA: Diagnosis present

## 2019-05-31 DIAGNOSIS — R58 Hemorrhage, not elsewhere classified: Secondary | ICD-10-CM | POA: Diagnosis present

## 2019-05-31 DIAGNOSIS — R109 Unspecified abdominal pain: Secondary | ICD-10-CM | POA: Diagnosis not present

## 2019-05-31 DIAGNOSIS — K863 Pseudocyst of pancreas: Secondary | ICD-10-CM | POA: Diagnosis present

## 2019-05-31 DIAGNOSIS — I7102 Dissection of abdominal aorta: Secondary | ICD-10-CM | POA: Diagnosis present

## 2019-05-31 DIAGNOSIS — E7800 Pure hypercholesterolemia, unspecified: Secondary | ICD-10-CM | POA: Diagnosis present

## 2019-05-31 DIAGNOSIS — K219 Gastro-esophageal reflux disease without esophagitis: Secondary | ICD-10-CM | POA: Diagnosis present

## 2019-05-31 DIAGNOSIS — R112 Nausea with vomiting, unspecified: Secondary | ICD-10-CM | POA: Diagnosis not present

## 2019-05-31 DIAGNOSIS — K859 Acute pancreatitis without necrosis or infection, unspecified: Secondary | ICD-10-CM | POA: Diagnosis present

## 2019-05-31 DIAGNOSIS — K862 Cyst of pancreas: Secondary | ICD-10-CM | POA: Diagnosis present

## 2019-05-31 DIAGNOSIS — E871 Hypo-osmolality and hyponatremia: Secondary | ICD-10-CM | POA: Diagnosis not present

## 2019-05-31 DIAGNOSIS — R1011 Right upper quadrant pain: Secondary | ICD-10-CM | POA: Diagnosis present

## 2019-05-31 DIAGNOSIS — Z8673 Personal history of transient ischemic attack (TIA), and cerebral infarction without residual deficits: Secondary | ICD-10-CM | POA: Diagnosis not present

## 2019-05-31 DIAGNOSIS — K828 Other specified diseases of gallbladder: Secondary | ICD-10-CM | POA: Diagnosis present

## 2019-05-31 DIAGNOSIS — Z8249 Family history of ischemic heart disease and other diseases of the circulatory system: Secondary | ICD-10-CM | POA: Diagnosis not present

## 2019-05-31 DIAGNOSIS — I10 Essential (primary) hypertension: Secondary | ICD-10-CM | POA: Diagnosis present

## 2019-05-31 DIAGNOSIS — R739 Hyperglycemia, unspecified: Secondary | ICD-10-CM | POA: Diagnosis not present

## 2019-05-31 DIAGNOSIS — E785 Hyperlipidemia, unspecified: Secondary | ICD-10-CM | POA: Diagnosis present

## 2019-05-31 DIAGNOSIS — Z7901 Long term (current) use of anticoagulants: Secondary | ICD-10-CM | POA: Diagnosis not present

## 2019-05-31 DIAGNOSIS — R61 Generalized hyperhidrosis: Secondary | ICD-10-CM | POA: Diagnosis present

## 2019-05-31 LAB — CBC
HCT: 27.7 % — ABNORMAL LOW (ref 39.0–52.0)
Hemoglobin: 8.7 g/dL — ABNORMAL LOW (ref 13.0–17.0)
MCH: 28.2 pg (ref 26.0–34.0)
MCHC: 31.4 g/dL (ref 30.0–36.0)
MCV: 89.6 fL (ref 80.0–100.0)
Platelets: 340 10*3/uL (ref 150–400)
RBC: 3.09 MIL/uL — ABNORMAL LOW (ref 4.22–5.81)
RDW: 15.1 % (ref 11.5–15.5)
WBC: 11.6 10*3/uL — ABNORMAL HIGH (ref 4.0–10.5)
nRBC: 0 % (ref 0.0–0.2)

## 2019-05-31 LAB — PROTIME-INR
INR: 2.3 — ABNORMAL HIGH (ref 0.8–1.2)
Prothrombin Time: 25.4 seconds — ABNORMAL HIGH (ref 11.4–15.2)

## 2019-05-31 LAB — HEPATIC FUNCTION PANEL
ALT: 16 U/L (ref 0–44)
AST: 22 U/L (ref 15–41)
Albumin: 2.1 g/dL — ABNORMAL LOW (ref 3.5–5.0)
Alkaline Phosphatase: 57 U/L (ref 38–126)
Bilirubin, Direct: 0.3 mg/dL — ABNORMAL HIGH (ref 0.0–0.2)
Indirect Bilirubin: 0.7 mg/dL (ref 0.3–0.9)
Total Bilirubin: 1 mg/dL (ref 0.3–1.2)
Total Protein: 6.1 g/dL — ABNORMAL LOW (ref 6.5–8.1)

## 2019-05-31 LAB — BASIC METABOLIC PANEL
Anion gap: 8 (ref 5–15)
BUN: 19 mg/dL (ref 8–23)
CO2: 23 mmol/L (ref 22–32)
Calcium: 8.2 mg/dL — ABNORMAL LOW (ref 8.9–10.3)
Chloride: 105 mmol/L (ref 98–111)
Creatinine, Ser: 1.04 mg/dL (ref 0.61–1.24)
GFR calc Af Amer: >60 mL/min (ref >60)
GFR calc non Af Amer: >60 mL/min (ref >60)
Glucose, Bld: 198 mg/dL — ABNORMAL HIGH (ref 70–99)
Potassium: 4.3 mmol/L (ref 3.5–5.1)
Sodium: 136 mmol/L (ref 135–145)

## 2019-05-31 LAB — GLUCOSE, CAPILLARY
Glucose-Capillary: 185 mg/dL — ABNORMAL HIGH (ref 70–99)
Glucose-Capillary: 223 mg/dL — ABNORMAL HIGH (ref 70–99)
Glucose-Capillary: 168 mg/dL — ABNORMAL HIGH (ref 70–99)
Glucose-Capillary: 169 mg/dL — ABNORMAL HIGH (ref 70–99)

## 2019-05-31 LAB — HEMOGLOBIN AND HEMATOCRIT, BLOOD
HCT: 25.4 % — ABNORMAL LOW (ref 39.0–52.0)
Hemoglobin: 8.2 g/dL — ABNORMAL LOW (ref 13.0–17.0)

## 2019-05-31 MED ORDER — SODIUM CHLORIDE 0.9 % IV SOLN
2.0000 g | INTRAVENOUS | Status: DC
  Administered 2019-05-31 – 2019-06-05 (×6): 2 g via INTRAVENOUS
  Filled 2019-05-31 (×6): qty 20

## 2019-05-31 NOTE — Progress Notes (Signed)
Progress Note   Subjective  Chief Complaint: Abdominal pain, pancreatic pseudocyst  This morning patient found with his wife by his bedside, tells me he continues with pain mostly now in his right upper quadrant, which is mostly constant still worse if he moves, talks for long time or tries to eat.  Tells me that he is aware of plans for no immediate surgery unless things worsen.  He is going to try some clear liquid diet today.  He has taken some sips of water which have been okay.  Did pass a few small stools after an enema yesterday.  Tells me though that he really has not eaten much.   Objective   Vital signs in last 24 hours: Temp:  [98.4 F (36.9 C)-99.9 F (37.7 C)] 99.1 F (37.3 C) (04/27 1100) Pulse Rate:  [95-112] 97 (04/27 1100) Resp:  [16-18] 16 (04/27 1100) BP: (114-129)/(59-68) 121/68 (04/27 1100) SpO2:  [93 %-97 %] 97 % (04/27 1100) Last BM Date: 05/30/19 General:    Uncomfortable white male in NAD Heart:  Regular rate and rhythm; no murmurs Lungs: Respirations even and unlabored, lungs CTA bilaterally Abdomen:  Soft, marked generalized ttp, worse in RUQ and nondistended. Normal bowel sounds. Extremities:  Without edema. Neurologic:  Alert and oriented,  grossly normal neurologically. Psych:  Cooperative. Normal mood and affect.  Intake/Output from previous day: 04/26 0701 - 04/27 0700 In: 1341 [I.V.:1341] Out: 400 [Urine:400]  Lab Results: Recent Labs    05/30/19 0058 05/30/19 0410 05/31/19 0739  WBC 13.1* 13.0* 11.6*  HGB 8.5* 8.8* 8.7*  HCT 26.9* 27.6* 27.7*  PLT 302 311 340   BMET Recent Labs    05/29/19 1428 05/29/19 1428 05/30/19 0058 05/30/19 0410 05/31/19 0739  NA 133*  --   --  134* 136  K 4.9  --   --  4.5 4.3  CL 97*  --   --  102 105  CO2 24  --   --  22 23  GLUCOSE 241*  --   --  245* 198*  BUN 20  --   --  18 19  CREATININE 1.30*   < > 1.14 1.14 1.04  CALCIUM 9.2  --   --  8.2* 8.2*   < > = values in this interval not  displayed.   LFT Recent Labs    05/31/19 0739  PROT 6.1*  ALBUMIN 2.1*  AST 22  ALT 16  ALKPHOS 57  BILITOT 1.0  BILIDIR 0.3*  IBILI 0.7   PT/INR Recent Labs    05/30/19 0119 05/31/19 0739  LABPROT 39.6* 25.4*  INR 4.1* 2.3*    Studies/Results: MR Abdomen W or Wo Contrast  Result Date: 05/30/2019 CLINICAL DATA:  Pancreatic tail mass. Possible hemorrhagic pseudocyst EXAM: MRI ABDOMEN WITHOUT AND WITH CONTRAST TECHNIQUE: Multiplanar multisequence MR imaging of the abdomen was performed both before and after the administration of intravenous contrast. CONTRAST:  28mL GADAVIST GADOBUTROL 1 MMOL/ML IV SOLN COMPARISON:  CT scans 05/26/2019 and 05/29/2019 FINDINGS: Lower chest: The lung bases demonstrates streaky atelectasis but no pleural or pericardial effusion. No worrisome pulmonary lesions. The distal esophagus is grossly normal. Aortic dissection is again demonstrated. Hepatobiliary: No hepatic lesions are identified. No intrahepatic biliary dilatation. The portal and hepatic veins are patent. Gallstones are noted in the gallbladder along with some layering gallbladder sludge. There is mild gallbladder wall thickening and inflammation. Normal caliber and course of the common bile duct. No common bile duct stones are  identified. Pancreas: The pancreatic head is normal. No mass or acute inflammation. The main pancreatic duct is normal in caliber and has a normal course. Persistent large heterogeneous masslike lesion in the pancreatic tail measures approximately 8.8 x 5.3 cm. Heterogeneous T1 and T2 signal intensity with several areas of increased T1 signal intensity consistent with hemorrhage. No contrast enhancement is demonstrated. Findings most consistent with a complex hemorrhagic pseudocyst. Spleen:  Normal size. No focal lesions. Adrenals/Urinary Tract: The adrenal glands and kidneys are unremarkable. Simple right renal cyst is noted. Stomach/Bowel: The stomach, duodenum, visualized  small bowel loops and visualized colon are unremarkable. Vascular/Lymphatic: Aortic dissection is again demonstrated which is a continuation of the thoracic aortic dissection. This continues all the way down to the bifurcation. The branch vessels are patent. Scattered small mesenteric and retroperitoneal lymph nodes. Fluid is noted along the medial liver edge and there is a small focal fluid collection along the anterior wall of the antral region of the stomach. Thin rim like enhancement is noted. This is likely a small pseudocyst. Other:  None. Musculoskeletal: Musculoskeletal no significant bony findings. IMPRESSION: 1. 8.8 x 5.3 cm heterogeneous masslike lesion in the pancreatic tail is most consistent with a complex hemorrhagic pseudocyst. 2. Cholelithiasis with mild gallbladder wall thickening and inflammation. 3. No common bile duct stones or biliary dilatation. 4. Chronic aortic dissection.  Aortic branch vessels are patent. 5. Small fluid collection along the anterior wall of the stomach, likely a small pseudocyst. There is also fluid along the medial liver edge which does not appear organized. Electronically Signed   By: Marijo Sanes M.D.   On: 05/30/2019 08:05   CT ABDOMEN PELVIS W CONTRAST  Result Date: 05/29/2019 CLINICAL DATA:  Epigastric pain and nausea x4 weeks. EXAM: CT ABDOMEN AND PELVIS WITH CONTRAST TECHNIQUE: Multidetector CT imaging of the abdomen and pelvis was performed using the standard protocol following bolus administration of intravenous contrast. CONTRAST:  138mL OMNIPAQUE IOHEXOL 300 MG/ML  SOLN COMPARISON:  May 26, 2019 FINDINGS: Lower chest: Multiple sternal wires are seen. Mild areas of atelectasis and/or scarring are seen within the bilateral lung bases. There is stenting of the visualized portion of the descending thoracic aorta. Hepatobiliary: No focal liver abnormality is seen. The 2.6 cm low-attenuation liver lesion seen within the region of the caudate lobe on the prior  study is not clearly identified on the current exam. Mild perihepatic fluid is seen within this region as well as the adjacent portion of the anterior right lobe of the liver. This represents a new finding when compared to the prior exam. Subcentimeter gallstones are seen within the gallbladder lumen without evidence of gallbladder wall thickening or biliary dilatation. Pancreas: A 9.6 cm x 5.5 cm well-defined area of heterogeneous low attenuation is again seen within the tail of the pancreas. Spleen: Normal in size without focal abnormality. Adrenals/Urinary Tract: Adrenal glands are unremarkable. Kidneys are normal, without renal calculi or hydronephrosis. A 3.2 cm simple cyst is seen within the posterior aspect of the lower pole of the right kidney. Bladder is unremarkable. Stomach/Bowel: There is mild thickening of the gastric antrum. Appendix appears normal. No evidence of bowel dilatation. Vascular/Lymphatic: Moderate to marked severity aortic calcification and atherosclerosis is seen with chronic, partially calcified dissection extending along the length of the abdominal aorta to the level of the bifurcation. No enlarged abdominal or pelvic lymph nodes. Mild right upper quadrant inflammatory fat stranding is seen within the region in between the gastric antrum/proximal duodenum and the  right lobe of the liver. This extends inferiorly along the lateral aspect of the mid to upper right abdomen and represents a new finding when compared to the prior study. Reproductive: Prostate is unremarkable. Other: A mild-to-moderate amount of posterior pelvic free fluid is seen. Musculoskeletal: Degenerative changes seen throughout the lumbar spine. IMPRESSION: 1. Cholelithiasis without evidence of acute cholecystitis. 2. 9.6 cm x 5.5 cm well-defined area of heterogeneous low attenuation is again seen within the tail of the pancreas. Further evaluation with MRI is recommended. 3. Interval development of mild right upper  quadrant inflammatory fat stranding since the prior study. This may represent sequelae associated with mild gastritis within the region of the gastric antrum. 4. Moderate to marked severity aortic calcification and atherosclerosis with chronic, partially calcified dissection extending along the length of the abdominal aorta to the level of the bifurcation. 5. 3.2 cm right renal cyst. 6. Mild-to-moderate amount of posterior pelvic free fluid, increased in severity when compared to the prior study. Aortic Atherosclerosis (ICD10-I70.0). Electronically Signed   By: Virgina Norfolk M.D.   On: 05/29/2019 20:12   US Abdomen Limited RUQ  Result Date: 05/29/2019 CLINICAL DATA:  Right upper quadrant pain. EXAM: ULTRASOUND ABDOMEN LIMITED RIGHT UPPER QUADRANT COMPARISON:  None. FINDINGS: Gallbladder: Multiple subcentimeter echogenic gallstones are seen within the gallbladder lumen without evidence of gallbladder wall thickening (1.2 mm). A mild amount of echogenic gallbladder sludge is also seen. A small amount of pericholecystic fluid is seen. No sonographic Murphy sign noted by sonographer. Common bile duct: Diameter: 3.8 mm proximally with limited visualization distally. Liver: No focal lesion identified. Diffusely increased echogenicity of the liver parenchyma is noted. Portal vein is patent on color Doppler imaging with normal direction of blood flow towards the liver. Other: A mild amount of fluid is seen adjacent to the caudate lobe and left lobe of the liver. A complex right pleural effusion is also seen. IMPRESSION: 1. Cholelithiasis and gallbladder sludge without evidence of acute cholecystitis. 2. Free fluid adjacent to the caudate lobe and left lobe of the liver with a complex right pleural effusion. Electronically Signed   By: Virgina Norfolk M.D.   On: 05/29/2019 21:08       Assessment / Plan:   Assessment: 1.  Large hemorrhagic pancreatic pseudocyst 2.  Leukocytosis 3.  Iron deficiency  anemia 4.  Abdominal pain 5.  Supratherapeutic INR 6.  Sitophobia 7.  Cholelithiasis  Plan: 1.  Lactic acid was normal yesterday 2.  Appreciate IR and surgical recommendations 3.  Continue to hold anticoagulation 4.  Agree with trial of clear liquids today 5.  Please await any further recommendations from Dr. Bryan Lemma later today  Thank you for kind consultation, we will continue to follow   LOS: 0 days   Alexander Barton  05/31/2019, 11:22 AM

## 2019-05-31 NOTE — Progress Notes (Addendum)
Subjective: CC: Abdominal pain Reports that he was unaware he had to ask for pain medication. Pain has improved this am. Mainly in the RUQ, constant and moderate in severity. No n/v.   ROS: See above, otherwise other systems negative   Objective: Vital signs in last 24 hours: Temp:  [98.4 F (36.9 C)-99.9 F (37.7 C)] 98.6 F (37 C) (04/27 0923) Pulse Rate:  [95-112] 95 (04/27 0923) Resp:  [16-18] 17 (04/27 0923) BP: (114-129)/(59-63) 117/59 (04/27 0923) SpO2:  [93 %-95 %] 94 % (04/27 0923) Last BM Date: 05/30/19  Intake/Output from previous day: 04/26 0701 - 04/27 0700 In: 1341 [I.V.:1341] Out: 400 [Urine:400] Intake/Output this shift: No intake/output data recorded.  PE: Gen:  Alert, NAD, pleasant Card:  RRR, no M/G/R heard Pulm:  CTAB, no W/R/R, effort normal Abd: Soft, mild distension, tenderness of the RUQ > epigastrium > periumbilical abdomen. +BS Ext:  No erythema, edema, or tenderness BUE/BLE  Psych: A&Ox3  Skin: no rashes noted, warm and dry  Lab Results:  Recent Labs    05/30/19 0410 05/31/19 0739  WBC 13.0* 11.6*  HGB 8.8* 8.7*  HCT 27.6* 27.7*  PLT 311 340   BMET Recent Labs    05/30/19 0410 05/31/19 0739  NA 134* 136  K 4.5 4.3  CL 102 105  CO2 22 23  GLUCOSE 245* 198*  BUN 18 19  CREATININE 1.14 1.04  CALCIUM 8.2* 8.2*   PT/INR Recent Labs    05/30/19 0119 05/31/19 0739  LABPROT 39.6* 25.4*  INR 4.1* 2.3*   CMP     Component Value Date/Time   NA 136 05/31/2019 0739   NA 137 01/10/2019 1228   K 4.3 05/31/2019 0739   CL 105 05/31/2019 0739   CO2 23 05/31/2019 0739   GLUCOSE 198 (H) 05/31/2019 0739   BUN 19 05/31/2019 0739   BUN 14 01/10/2019 1228   CREATININE 1.04 05/31/2019 0739   CALCIUM 8.2 (L) 05/31/2019 0739   PROT 6.4 (L) 05/30/2019 0410   PROT 6.6 01/10/2019 1228   ALBUMIN 2.4 (L) 05/30/2019 0410   ALBUMIN 3.7 (L) 01/10/2019 1228   AST 14 (L) 05/30/2019 0410   ALT 14 05/30/2019 0410   ALKPHOS 57  05/30/2019 0410   BILITOT 1.1 05/30/2019 0410   BILITOT 0.8 01/10/2019 1228   GFRNONAA >60 05/31/2019 0739   GFRAA >60 05/31/2019 0739   Lipase     Component Value Date/Time   LIPASE 71 (H) 05/29/2019 1428       Studies/Results: MR Abdomen W or Wo Contrast  Result Date: 05/30/2019 CLINICAL DATA:  Pancreatic tail mass. Possible hemorrhagic pseudocyst EXAM: MRI ABDOMEN WITHOUT AND WITH CONTRAST TECHNIQUE: Multiplanar multisequence MR imaging of the abdomen was performed both before and after the administration of intravenous contrast. CONTRAST:  2mL GADAVIST GADOBUTROL 1 MMOL/ML IV SOLN COMPARISON:  CT scans 05/26/2019 and 05/29/2019 FINDINGS: Lower chest: The lung bases demonstrates streaky atelectasis but no pleural or pericardial effusion. No worrisome pulmonary lesions. The distal esophagus is grossly normal. Aortic dissection is again demonstrated. Hepatobiliary: No hepatic lesions are identified. No intrahepatic biliary dilatation. The portal and hepatic veins are patent. Gallstones are noted in the gallbladder along with some layering gallbladder sludge. There is mild gallbladder wall thickening and inflammation. Normal caliber and course of the common bile duct. No common bile duct stones are identified. Pancreas: The pancreatic head is normal. No mass or acute inflammation. The main pancreatic duct is normal in caliber  and has a normal course. Persistent large heterogeneous masslike lesion in the pancreatic tail measures approximately 8.8 x 5.3 cm. Heterogeneous T1 and T2 signal intensity with several areas of increased T1 signal intensity consistent with hemorrhage. No contrast enhancement is demonstrated. Findings most consistent with a complex hemorrhagic pseudocyst. Spleen:  Normal size. No focal lesions. Adrenals/Urinary Tract: The adrenal glands and kidneys are unremarkable. Simple right renal cyst is noted. Stomach/Bowel: The stomach, duodenum, visualized small bowel loops and  visualized colon are unremarkable. Vascular/Lymphatic: Aortic dissection is again demonstrated which is a continuation of the thoracic aortic dissection. This continues all the way down to the bifurcation. The branch vessels are patent. Scattered small mesenteric and retroperitoneal lymph nodes. Fluid is noted along the medial liver edge and there is a small focal fluid collection along the anterior wall of the antral region of the stomach. Thin rim like enhancement is noted. This is likely a small pseudocyst. Other:  None. Musculoskeletal: Musculoskeletal no significant bony findings. IMPRESSION: 1. 8.8 x 5.3 cm heterogeneous masslike lesion in the pancreatic tail is most consistent with a complex hemorrhagic pseudocyst. 2. Cholelithiasis with mild gallbladder wall thickening and inflammation. 3. No common bile duct stones or biliary dilatation. 4. Chronic aortic dissection.  Aortic branch vessels are patent. 5. Small fluid collection along the anterior wall of the stomach, likely a small pseudocyst. There is also fluid along the medial liver edge which does not appear organized. Electronically Signed   By: Marijo Sanes M.D.   On: 05/30/2019 08:05   CT ABDOMEN PELVIS W CONTRAST  Result Date: 05/29/2019 CLINICAL DATA:  Epigastric pain and nausea x4 weeks. EXAM: CT ABDOMEN AND PELVIS WITH CONTRAST TECHNIQUE: Multidetector CT imaging of the abdomen and pelvis was performed using the standard protocol following bolus administration of intravenous contrast. CONTRAST:  154mL OMNIPAQUE IOHEXOL 300 MG/ML  SOLN COMPARISON:  May 26, 2019 FINDINGS: Lower chest: Multiple sternal wires are seen. Mild areas of atelectasis and/or scarring are seen within the bilateral lung bases. There is stenting of the visualized portion of the descending thoracic aorta. Hepatobiliary: No focal liver abnormality is seen. The 2.6 cm low-attenuation liver lesion seen within the region of the caudate lobe on the prior study is not clearly  identified on the current exam. Mild perihepatic fluid is seen within this region as well as the adjacent portion of the anterior right lobe of the liver. This represents a new finding when compared to the prior exam. Subcentimeter gallstones are seen within the gallbladder lumen without evidence of gallbladder wall thickening or biliary dilatation. Pancreas: A 9.6 cm x 5.5 cm well-defined area of heterogeneous low attenuation is again seen within the tail of the pancreas. Spleen: Normal in size without focal abnormality. Adrenals/Urinary Tract: Adrenal glands are unremarkable. Kidneys are normal, without renal calculi or hydronephrosis. A 3.2 cm simple cyst is seen within the posterior aspect of the lower pole of the right kidney. Bladder is unremarkable. Stomach/Bowel: There is mild thickening of the gastric antrum. Appendix appears normal. No evidence of bowel dilatation. Vascular/Lymphatic: Moderate to marked severity aortic calcification and atherosclerosis is seen with chronic, partially calcified dissection extending along the length of the abdominal aorta to the level of the bifurcation. No enlarged abdominal or pelvic lymph nodes. Mild right upper quadrant inflammatory fat stranding is seen within the region in between the gastric antrum/proximal duodenum and the right lobe of the liver. This extends inferiorly along the lateral aspect of the mid to upper right abdomen and  represents a new finding when compared to the prior study. Reproductive: Prostate is unremarkable. Other: A mild-to-moderate amount of posterior pelvic free fluid is seen. Musculoskeletal: Degenerative changes seen throughout the lumbar spine. IMPRESSION: 1. Cholelithiasis without evidence of acute cholecystitis. 2. 9.6 cm x 5.5 cm well-defined area of heterogeneous low attenuation is again seen within the tail of the pancreas. Further evaluation with MRI is recommended. 3. Interval development of mild right upper quadrant inflammatory  fat stranding since the prior study. This may represent sequelae associated with mild gastritis within the region of the gastric antrum. 4. Moderate to marked severity aortic calcification and atherosclerosis with chronic, partially calcified dissection extending along the length of the abdominal aorta to the level of the bifurcation. 5. 3.2 cm right renal cyst. 6. Mild-to-moderate amount of posterior pelvic free fluid, increased in severity when compared to the prior study. Aortic Atherosclerosis (ICD10-I70.0). Electronically Signed   By: Virgina Norfolk M.D.   On: 05/29/2019 20:12   US Abdomen Limited RUQ  Result Date: 05/29/2019 CLINICAL DATA:  Right upper quadrant pain. EXAM: ULTRASOUND ABDOMEN LIMITED RIGHT UPPER QUADRANT COMPARISON:  None. FINDINGS: Gallbladder: Multiple subcentimeter echogenic gallstones are seen within the gallbladder lumen without evidence of gallbladder wall thickening (1.2 mm). A mild amount of echogenic gallbladder sludge is also seen. A small amount of pericholecystic fluid is seen. No sonographic Murphy sign noted by sonographer. Common bile duct: Diameter: 3.8 mm proximally with limited visualization distally. Liver: No focal lesion identified. Diffusely increased echogenicity of the liver parenchyma is noted. Portal vein is patent on color Doppler imaging with normal direction of blood flow towards the liver. Other: A mild amount of fluid is seen adjacent to the caudate lobe and left lobe of the liver. A complex right pleural effusion is also seen. IMPRESSION: 1. Cholelithiasis and gallbladder sludge without evidence of acute cholecystitis. 2. Free fluid adjacent to the caudate lobe and left lobe of the liver with a complex right pleural effusion. Electronically Signed   By: Virgina Norfolk M.D.   On: 05/29/2019 21:08    Anti-infectives: Anti-infectives (From admission, onward)   None       Assessment/Plan Hx TIA/CVA HTN HLD GERD COPD Hx of Aortic Aneurysm  Repair and AVR on Coumadin - INR 2.3  Abdominal Pain, nausea Symptomatic Cholelithiasis   - Suspect this is the cause of the patients pain - Will plan for non-operative management with patients complex hemorrhagic pseudocyst of the pancreas that was noted on MRI.  - Allow CLD today and monitor.  - LFTs without elevation yesterday. Pending this am. WBC downtredning. Afebrile. Will discuss with MD if he would like to add abx coverage. CT scan, RUQ [place] and MRI without definitive evidence of acute cholecystitis on imaging.  Pancreatitic Pseudocyst - CT 4/25 with 9.6 cm x 5.5 cm well-defined area of heterogeneous low attenuation at the tail of the pancreas. - MRI 4/26 w/ 8.8 x 5.3 cm heterogeneous masslike lesion in the pancreatic tail is most consistent with a complex hemorrhagic pseudocyst - CA 19-9 not elevated - GI following - Dr. Barry Dienes reviewed films with Dr. Ninfa Linden  FEN - CLD VTE - SCDs, INR 2.3 ID - None  Plan: Non-operative management of GB given patients complex hemorrhagic pseudocyst of the pancreas that was noted on MRI. We will allow CLD today and monitor symptoms before advancing diet. We will plan for outpatient scan in 4-6 weeks to monitor resolution of pseudocyst before discussing interval elective Cholecystectomy as an outpatient.  I discussed in detail the patients diagnoses and the plan using pictures/diagrams to both the patient and his wife. All questions were answered prior to me leaving the room.     LOS: 0 days    Jillyn Ledger , [facility] 05/31/2019, 10:27 AM Please see Amion for pager number during day hours 7:00am-4:30pm

## 2019-05-31 NOTE — Plan of Care (Signed)

## 2019-05-31 NOTE — Progress Notes (Signed)
Pt mews of 2 noted at 0910. Revalidated/rechecked V/S, mews now green, Reassessed pt alert/oriented x4 in no apparent distress. Attending MD was in the pt's room and notified of mews of 2 and received order for EKG stat.  Vital Signs @MEWSNOTE @       Rodell Perna 05/31/2019,9:41 AM  Provider Notification      05/30/2019 [phone] 05/30/2019 0647 05/31/2019 0900     Provider Name/Title:  blount  blount  Pokhrel Laxman,MD       Simultaneous filing. User may not have seen previous data.    Date Provider Notified:  05/30/19  05/30/19  05/31/19       Simultaneous filing. User may not have seen previous data.    Time Provider Notified:  [id number]       Simultaneous filing. User may not have seen previous data.    Notification Type:  Page  Page  Face-to-face       Simultaneous filing. User may not have seen previous data.    Notification Reason:  Other (Comment);Requested by patient/family  --  Other (Comment)     pt in 7/10 pain   Pt HR in 112  Simultaneous filing. User may not have seen previous data.    Response:  Other (Comment)  --  Other (Comment)     no response   Simultaneous filing. User may not have seen previous data.    Date of Provider Response:  --  --  05/31/19    Time of Provider Response:  --  --  [id number]      Rapid Response Notification      05/31/2019 0923         Name of Rapid Response RN Notified:  n/a-yellow MEWS

## 2019-05-31 NOTE — Progress Notes (Addendum)
PROGRESS NOTE  Alexander Barton [id number] DOB: [birth year] DOA: 05/29/2019 PCP: Leone Haven, MD   LOS: 0 days   Brief narrative: As per HPI,  Alexander Barton is a 69 y.o. male with medical history significant of hypertension, hyperlipidemia, mechanical aortic valve replacement, stroke and COPD presented to the hospital for evaluation of worsening nominal pain with nausea and vomiting.  Patient stated that having pain for at least 2 weeks which was getting worse for 1 week with nausea vomiting.  Pain was worse especially after eating.  Patient further mentioned that he had a CT scan last week and it showed a new pancreatic mass and was scheduled for gastroenterology appointment. ED Course: On arrival to ED patient had temperature of 98.4, blood pressure 101/53, heart rate 90, respiratory rate 16 and oxygen saturation 96% on room air.  Blood work showed WBC 12.9, hemoglobin 10, platelet 347, BUN 20, creatinine 1.3 and blood glucose 241.  CT abdomen/pelvis showed cholelithiasis without evidence of acute cholecystitis.  CT abdomen also showed well-defined area of heterogeneous low-attenuation seen within the tail of pancreas.  8 with IV fluids, IV fentanyl, famotidine and Zofran in the ED.  Physician also contacted gastroenterology and they recommended MR abdomen with and without contrast.  Assessment/Plan:  Principal Problem:   Intractable abdominal pain Active Problems:   Nausea and vomiting   Pancreatic mass   Hyperglycemia   Pancreatic cyst  Abdominal pain -intractable.  Likely secondary to gallstones causing pancreatitis and pancreatic pseudocyst with intracystic hemorrhage. Continue IV fluids analgesia, currently n.p.o but will be advanced to clears today.   GI and surgery on board.  Patient has been empirically started on Rocephin IV  Complex hemorrhagic cyst of the pancreas.  As per MRI.  GI and surgery on board.    Conservative treatment for now.  Likely pseudocyst with  hemorrhage.  Patient is on Coumadin at home for mechanical aortic valve.  Coumadin is still on hold.  INR of 2.3 today from 4.1 yesterday.  Cholelithiasis with gallbladder sludge - CT scan and ultrasound was performed.  MRI abdomen shows mild gallbladder wall thickening and inflammation. General surgery on board at this time.  Conservative treatment due to intracystic hemorrhage.  Empirically on Rocephin IV  Supratherapeutic INR. on Presentation.  INR 2.3 today.   Coumadin on hold.   History of aortic valve replacement.  Will communicate with GI and surgery regarding the need for transition to heparin drip or continue Coumadin.  History of aortic valve replacement, stroke.    Coumadin on hold.COPD.  Currently compensated.  Hyperglycemia.  Patient denies history of diabetes but his last A1c in 2018 was 6.1.    Repeat hemoglobin A1c from 05/30/2019 at 7.3.  Consider sliding scale insulin for now.  Patient will benefit from oral hypoglycemics on discharge.  VTE Prophylaxis: Currently therapeutic INR  Code Status: Full code  Family Communication: Spoke with the with the patient's wife at bedside and answered all the queries they had.  Status is: Observation  The patient will require care spanning > 2 midnights and should be moved to inpatient because: Ongoing active pain requiring inpatient pain management, IV treatments appropriate due to intensity of illness or inability to take PO, Inpatient level of care appropriate due to severity of illness and Need for IV anticoagulation, closer follow-up with specialist  Dispo: The patient is from: Home              Anticipated d/c is to: Home  Anticipated d/c date is: 2 days              Patient currently is not medically stable to d/c.  Addendum:  05/31/2019 1:46 PM   Spoke with Dr. Tor Netters GI regarding  anticoagulation.  Patient does have cystic lesion in the pancreas and the previous chest CT scan done at [facility] from 2/21.    At this time, we will put the patient on heparin drip and check her H&H every 12.  Consult pharmacy to dose.  Spoke with pharmacy about starting heparin drip.  Surgery and GI to follow.  Surgery indicated no surgical intervention so far.   Consultants:  Gastroenterology  General surgery  Interventional radiology  Procedures:  None  Antibiotics:  . Rocephin IV  Anti-infectives (From admission, onward)   Start     Dose/Rate Route Frequency Ordered Stop   05/31/19 1200  cefTRIAXone (ROCEPHIN) 2 g in sodium chloride 0.9 % 100 mL IVPB     2 g 200 mL/hr over 30 Minutes Intravenous Every 24 hours 05/31/19 1100        Subjective: Today, patient was seen and examined at bedside.  Complaints of epigastric upper abdominal pain especially on movement..  Denies any appetite at all.  Had some small bowel movement yesterday after suppository.  Objective: Vitals:   05/31/19 0923 05/31/19 1100  BP: (!) 117/59 121/68  Pulse: 95 97  Resp: 17 16  Temp: 98.6 F (37 C) 99.1 F (37.3 C)  SpO2: 94% 97%    Intake/Output Summary (Last 24 hours) at 05/31/2019 1240 Last data filed at 05/30/2019 1500 Gross per 24 hour  Intake 1341.01 ml  Output --  Net 1341.01 ml   Filed Weights   05/30/19 0626  Weight: 97.6 kg   Body mass index is 28.39 kg/m.   Physical Exam: GENERAL: Patient is alert awake and oriented. Not in obvious distress. HENT: No scleral pallor or icterus. Pupils equally reactive to light. Oral mucosa is moist NECK: is supple, no gross swelling noted. CHEST: Clear to auscultation. No crackles or wheezes.  Diminished breath sounds bilaterally. CVS: S1 and S2 heard, no murmur. Regular rate and rhythm.  ABDOMEN: Soft, generalized tenderness on palpation mostly over the right upper quadrant, mild distention noted bowel sounds are present. EXTREMITIES: No edema. CNS: Cranial nerves are intact. No focal motor deficits. SKIN: warm and dry without rashes.  Data Review: I have  personally reviewed the following laboratory data and studies,  CBC: Recent Labs  Lab 05/25/19 1140 05/29/19 1428 05/30/19 0058 05/30/19 0410 05/31/19 0739  WBC 5.9 12.9* 13.1* 13.0* 11.6*  NEUTROABS 4.1  --   --   --   --   HGB 9.6* 10.0* 8.5* 8.8* 8.7*  HCT 28.2* 31.1* 26.9* 27.6* 27.7*  MCV 86.5 87.9 89.7 88.5 89.6  PLT 248.0 347 302 311 [id number]   Basic Metabolic Panel: Recent Labs  Lab 05/25/19 1140 05/29/19 1428 05/30/19 0058 05/30/19 0410 05/31/19 0739  NA 133* 133*  --  134* 136  K 3.9 4.9  --  4.5 4.3  CL 98 97*  --  102 105  CO2 27 24  --  22 23  GLUCOSE 184* 241*  --  245* 198*  BUN 17 20  --  18 19  CREATININE 1.10 1.30* 1.14 1.14 1.04  CALCIUM 9.1 9.2  --  8.2* 8.2*   Liver Function Tests: Recent Labs  Lab 05/25/19 1140 05/29/19 1428 05/30/19 0410 05/31/19 0739  AST  17 19 14* 22  ALT 10 14 14 16   ALKPHOS 74 71 57 57  BILITOT 0.9 1.7* 1.1 1.0  PROT 7.5 7.5 6.4* 6.1*  ALBUMIN 3.9 3.0* 2.4* 2.1*   Recent Labs  Lab 05/25/19 1140 05/29/19 1428  LIPASE 59.0 71*   No results for input(s): AMMONIA in the last 168 hours. Cardiac Enzymes: No results for input(s): CKTOTAL, CKMB, CKMBINDEX, TROPONINI in the last 168 hours. BNP (last 3 results) No results for input(s): BNP in the last 8760 hours.  ProBNP (last 3 results) No results for input(s): PROBNP in the last 8760 hours.  CBG: Recent Labs  Lab 05/30/19 1132 05/30/19 1652 05/30/19 2249 05/31/19 0926 05/31/19 1156  GLUCAP 170* 157* 192* 185* 223*   Recent Results (from the past 240 hour(s))  SARS CORONAVIRUS 2 (TAT 6-24 HRS) Nasopharyngeal Nasopharyngeal Swab     Status: None   Collection Time: 05/30/19 12:25 AM   Specimen: Nasopharyngeal Swab  Result Value Ref Range Status   SARS Coronavirus 2 NEGATIVE NEGATIVE Final    Comment: (NOTE) SARS-CoV-2 target nucleic acids are NOT DETECTED. The SARS-CoV-2 RNA is generally detectable in upper and lower respiratory specimens during the acute  phase of infection. Negative results do not preclude SARS-CoV-2 infection, do not rule out co-infections with other pathogens, and should not be used as the sole basis for treatment or other patient management decisions. Negative results must be combined with clinical observations, patient history, and epidemiological information. The expected result is Negative. Fact Sheet for Patients: SugarRoll.be Fact Sheet for Healthcare Providers: https://www.woods-mathews.com/ This test is not yet approved or cleared by the [place] FDA and  has been authorized for detection and/or diagnosis of SARS-CoV-2 by FDA under an Emergency Use Authorization (EUA). This EUA will remain  in effect (meaning this test can be used) for the duration of the COVID-19 declaration under Section 56 4(b)(1) of the Act, 21 U.S.C. section 360bbb-3(b)(1), unless the authorization is terminated or revoked sooner. Performed at [facility], [place] [address] [place], [place] [address]      Studies: MR Abdomen W or Wo Contrast  Result Date: 05/30/2019 CLINICAL DATA:  Pancreatic tail mass. Possible hemorrhagic pseudocyst EXAM: MRI ABDOMEN WITHOUT AND WITH CONTRAST TECHNIQUE: Multiplanar multisequence MR imaging of the abdomen was performed both before and after the administration of intravenous contrast. CONTRAST:  52mL GADAVIST GADOBUTROL 1 MMOL/ML IV SOLN COMPARISON:  CT scans 05/26/2019 and 05/29/2019 FINDINGS: Lower chest: The lung bases demonstrates streaky atelectasis but no pleural or pericardial effusion. No worrisome pulmonary lesions. The distal esophagus is grossly normal. Aortic dissection is again demonstrated. Hepatobiliary: No hepatic lesions are identified. No intrahepatic biliary dilatation. The portal and hepatic veins are patent. Gallstones are noted in the gallbladder along with some layering gallbladder sludge. There is mild gallbladder wall thickening and  inflammation. Normal caliber and course of the common bile duct. No common bile duct stones are identified. Pancreas: The pancreatic head is normal. No mass or acute inflammation. The main pancreatic duct is normal in caliber and has a normal course. Persistent large heterogeneous masslike lesion in the pancreatic tail measures approximately 8.8 x 5.3 cm. Heterogeneous T1 and T2 signal intensity with several areas of increased T1 signal intensity consistent with hemorrhage. No contrast enhancement is demonstrated. Findings most consistent with a complex hemorrhagic pseudocyst. Spleen:  Normal size. No focal lesions. Adrenals/Urinary Tract: The adrenal glands and kidneys are unremarkable. Simple right renal cyst is noted. Stomach/Bowel: The stomach, duodenum, visualized  small bowel loops and visualized colon are unremarkable. Vascular/Lymphatic: Aortic dissection is again demonstrated which is a continuation of the thoracic aortic dissection. This continues all the way down to the bifurcation. The branch vessels are patent. Scattered small mesenteric and retroperitoneal lymph nodes. Fluid is noted along the medial liver edge and there is a small focal fluid collection along the anterior wall of the antral region of the stomach. Thin rim like enhancement is noted. This is likely a small pseudocyst. Other:  None. Musculoskeletal: Musculoskeletal no significant bony findings. IMPRESSION: 1. 8.8 x 5.3 cm heterogeneous masslike lesion in the pancreatic tail is most consistent with a complex hemorrhagic pseudocyst. 2. Cholelithiasis with mild gallbladder wall thickening and inflammation. 3. No common bile duct stones or biliary dilatation. 4. Chronic aortic dissection.  Aortic branch vessels are patent. 5. Small fluid collection along the anterior wall of the stomach, likely a small pseudocyst. There is also fluid along the medial liver edge which does not appear organized. Electronically Signed   By: Marijo Sanes M.D.    On: 05/30/2019 08:05   CT ABDOMEN PELVIS W CONTRAST  Result Date: 05/29/2019 CLINICAL DATA:  Epigastric pain and nausea x4 weeks. EXAM: CT ABDOMEN AND PELVIS WITH CONTRAST TECHNIQUE: Multidetector CT imaging of the abdomen and pelvis was performed using the standard protocol following bolus administration of intravenous contrast. CONTRAST:  137mL OMNIPAQUE IOHEXOL 300 MG/ML  SOLN COMPARISON:  May 26, 2019 FINDINGS: Lower chest: Multiple sternal wires are seen. Mild areas of atelectasis and/or scarring are seen within the bilateral lung bases. There is stenting of the visualized portion of the descending thoracic aorta. Hepatobiliary: No focal liver abnormality is seen. The 2.6 cm low-attenuation liver lesion seen within the region of the caudate lobe on the prior study is not clearly identified on the current exam. Mild perihepatic fluid is seen within this region as well as the adjacent portion of the anterior right lobe of the liver. This represents a new finding when compared to the prior exam. Subcentimeter gallstones are seen within the gallbladder lumen without evidence of gallbladder wall thickening or biliary dilatation. Pancreas: A 9.6 cm x 5.5 cm well-defined area of heterogeneous low attenuation is again seen within the tail of the pancreas. Spleen: Normal in size without focal abnormality. Adrenals/Urinary Tract: Adrenal glands are unremarkable. Kidneys are normal, without renal calculi or hydronephrosis. A 3.2 cm simple cyst is seen within the posterior aspect of the lower pole of the right kidney. Bladder is unremarkable. Stomach/Bowel: There is mild thickening of the gastric antrum. Appendix appears normal. No evidence of bowel dilatation. Vascular/Lymphatic: Moderate to marked severity aortic calcification and atherosclerosis is seen with chronic, partially calcified dissection extending along the length of the abdominal aorta to the level of the bifurcation. No enlarged abdominal or pelvic  lymph nodes. Mild right upper quadrant inflammatory fat stranding is seen within the region in between the gastric antrum/proximal duodenum and the right lobe of the liver. This extends inferiorly along the lateral aspect of the mid to upper right abdomen and represents a new finding when compared to the prior study. Reproductive: Prostate is unremarkable. Other: A mild-to-moderate amount of posterior pelvic free fluid is seen. Musculoskeletal: Degenerative changes seen throughout the lumbar spine. IMPRESSION: 1. Cholelithiasis without evidence of acute cholecystitis. 2. 9.6 cm x 5.5 cm well-defined area of heterogeneous low attenuation is again seen within the tail of the pancreas. Further evaluation with MRI is recommended. 3. Interval development of mild right upper quadrant inflammatory  fat stranding since the prior study. This may represent sequelae associated with mild gastritis within the region of the gastric antrum. 4. Moderate to marked severity aortic calcification and atherosclerosis with chronic, partially calcified dissection extending along the length of the abdominal aorta to the level of the bifurcation. 5. 3.2 cm right renal cyst. 6. Mild-to-moderate amount of posterior pelvic free fluid, increased in severity when compared to the prior study. Aortic Atherosclerosis (ICD10-I70.0). Electronically Signed   By: Virgina Norfolk M.D.   On: 05/29/2019 20:12   US Abdomen Limited RUQ  Result Date: 05/29/2019 CLINICAL DATA:  Right upper quadrant pain. EXAM: ULTRASOUND ABDOMEN LIMITED RIGHT UPPER QUADRANT COMPARISON:  None. FINDINGS: Gallbladder: Multiple subcentimeter echogenic gallstones are seen within the gallbladder lumen without evidence of gallbladder wall thickening (1.2 mm). A mild amount of echogenic gallbladder sludge is also seen. A small amount of pericholecystic fluid is seen. No sonographic Murphy sign noted by sonographer. Common bile duct: Diameter: 3.8 mm proximally with limited  visualization distally. Liver: No focal lesion identified. Diffusely increased echogenicity of the liver parenchyma is noted. Portal vein is patent on color Doppler imaging with normal direction of blood flow towards the liver. Other: A mild amount of fluid is seen adjacent to the caudate lobe and left lobe of the liver. A complex right pleural effusion is also seen. IMPRESSION: 1. Cholelithiasis and gallbladder sludge without evidence of acute cholecystitis. 2. Free fluid adjacent to the caudate lobe and left lobe of the liver with a complex right pleural effusion. Electronically Signed   By: Virgina Norfolk M.D.   On: 05/29/2019 21:08      Flora Lipps, MD  [facility] 05/31/2019

## 2019-05-31 NOTE — Care Management Obs Status (Cosign Needed)
[place] NOTIFICATION   Patient Details  Name: MAXYMILIAN SHERRATT MRN: [id number] Date of Birth: 06-23-1950   Medicare Observation Status Notification Given:  Yes    Curlene Labrum, RN 05/31/2019, 8:30 AM

## 2019-05-31 NOTE — Progress Notes (Signed)
Attending MD has been informed about the EKG results  Of ST with PAC. No new orders at this time. Pt closely monitored. No complaints.

## 2019-05-31 NOTE — Progress Notes (Signed)
ANTICOAGULATION CONSULT NOTE - Follow Up Consult  Pharmacy Consult for Heparin Indication: Coumadin on hold, valve  Allergies  Allergen Reactions  . Codeine Anaphylaxis  . Gadolinium Derivatives Other (See Comments)    Other Reaction: intense sneezing MRI contrast  . Hydrocodone Anaphylaxis  . Iodinated Diagnostic Agents Other (See Comments)    Uncontrollable sneezing, needs sedative the night before and hour and benadryl before using Other Reaction: sneezing with SOB after prep  . Statins Other (See Comments)    Myalgias  . Hydromorphone Other (See Comments)  . Lipitor [Atorvastatin Calcium] Other (See Comments)    Myalgias   . Seroquel [Quetiapine Fumerate] Other (See Comments)    "bad trip" tongue swell    Patient Measurements: Height: 6\' 1"  (185.4 cm) Weight: 97.6 kg (215 lb 2.7 oz) IBW/kg (Calculated) : 79.9  Vital Signs: Temp: 100 F (37.8 C) (04/27 1315) Temp Source: Oral (04/27 1315) BP: 108/56 (04/27 1315) Pulse Rate: 97 (04/27 1315)  Labs: Recent Labs    05/30/19 0058 05/30/19 0058 05/30/19 0119 05/30/19 0410 05/31/19 0739  HGB 8.5*   < >  --  8.8* 8.7*  HCT 26.9*  --   --  27.6* 27.7*  PLT 302  --   --  311 340  LABPROT  --   --  39.6*  --  25.4*  INR  --   --  4.1*  --  2.3*  CREATININE 1.14  --   --  1.14 1.04   < > = values in this interval not displayed.    Estimated Creatinine Clearance: 81.3 mL/min (by C-G formula based on SCr of 1.04 mg/dL).  Assessment: 69 year old male on warfarin prior to admission for aortic valve.  Admitted with abdominal pain and hemorrhagic cyst of the pancreas.  Warfarin now on hold and will transition to heparin when INR < 2  Goal INR on warfarin is 2 to 2.5 per last out patient anti-coag visit  Goal of Therapy:  Heparin level 0.3-0.7 units/ml Monitor platelets by anticoagulation protocol: Yes   Plan:  Daily INR Heparin when INR < 2  Thank you Anette Guarneri, PharmD   05/31/2019,1:51 PM

## 2019-06-01 ENCOUNTER — Ambulatory Visit: Payer: Medicare PPO

## 2019-06-01 DIAGNOSIS — I7102 Dissection of abdominal aorta: Secondary | ICD-10-CM

## 2019-06-01 LAB — HEMOGLOBIN AND HEMATOCRIT, BLOOD
HCT: 25.9 % — ABNORMAL LOW (ref 39.0–52.0)
Hemoglobin: 8.3 g/dL — ABNORMAL LOW (ref 13.0–17.0)

## 2019-06-01 LAB — COMPREHENSIVE METABOLIC PANEL
ALT: 25 U/L (ref 0–44)
AST: 35 U/L (ref 15–41)
Albumin: 1.8 g/dL — ABNORMAL LOW (ref 3.5–5.0)
Alkaline Phosphatase: 59 U/L (ref 38–126)
Anion gap: 8 (ref 5–15)
BUN: 18 mg/dL (ref 8–23)
CO2: 23 mmol/L (ref 22–32)
Calcium: 8 mg/dL — ABNORMAL LOW (ref 8.9–10.3)
Chloride: 104 mmol/L (ref 98–111)
Creatinine, Ser: 0.99 mg/dL (ref 0.61–1.24)
GFR calc Af Amer: >60 mL/min (ref >60)
GFR calc non Af Amer: >60 mL/min (ref >60)
Glucose, Bld: 175 mg/dL — ABNORMAL HIGH (ref 70–99)
Potassium: 4.1 mmol/L (ref 3.5–5.1)
Sodium: 135 mmol/L (ref 135–145)
Total Bilirubin: 1 mg/dL (ref 0.3–1.2)
Total Protein: 5.7 g/dL — ABNORMAL LOW (ref 6.5–8.1)

## 2019-06-01 LAB — CBC
HCT: 25.8 % — ABNORMAL LOW (ref 39.0–52.0)
Hemoglobin: 8.3 g/dL — ABNORMAL LOW (ref 13.0–17.0)
MCH: 28.8 pg (ref 26.0–34.0)
MCHC: 32.2 g/dL (ref 30.0–36.0)
MCV: 89.6 fL (ref 80.0–100.0)
Platelets: 329 10*3/uL (ref 150–400)
RBC: 2.88 MIL/uL — ABNORMAL LOW (ref 4.22–5.81)
RDW: 15 % (ref 11.5–15.5)
WBC: 10.4 10*3/uL (ref 4.0–10.5)
nRBC: 0 % (ref 0.0–0.2)

## 2019-06-01 LAB — GLUCOSE, CAPILLARY
Glucose-Capillary: 171 mg/dL — ABNORMAL HIGH (ref 70–99)
Glucose-Capillary: 147 mg/dL — ABNORMAL HIGH (ref 70–99)
Glucose-Capillary: 143 mg/dL — ABNORMAL HIGH (ref 70–99)
Glucose-Capillary: 132 mg/dL — ABNORMAL HIGH (ref 70–99)

## 2019-06-01 LAB — PROTIME-INR
INR: 2.1 — ABNORMAL HIGH (ref 0.8–1.2)
Prothrombin Time: 22.4 seconds — ABNORMAL HIGH (ref 11.4–15.2)

## 2019-06-01 LAB — HEPARIN LEVEL (UNFRACTIONATED): Heparin Unfractionated: <0.1 IU/mL — ABNORMAL LOW (ref 0.30–0.70)

## 2019-06-01 LAB — MAGNESIUM: Magnesium: 2.1 mg/dL (ref 1.7–2.4)

## 2019-06-01 MED ORDER — HEPARIN (PORCINE) 25000 UT/250ML-% IV SOLN
1850.0000 [IU]/h | INTRAVENOUS | Status: DC
  Administered 2019-06-01: 1150 [IU]/h via INTRAVENOUS
  Administered 2019-06-02: 1650 [IU]/h via INTRAVENOUS
  Administered 2019-06-02: 1950 [IU]/h via INTRAVENOUS
  Administered 2019-06-03 – 2019-06-04 (×3): 2150 [IU]/h via INTRAVENOUS
  Administered 2019-06-05: 2000 [IU]/h via INTRAVENOUS
  Filled 2019-06-01 (×8): qty 250

## 2019-06-01 MED ORDER — ENSURE ENLIVE PO LIQD
237.0000 mL | Freq: Three times a day (TID) | ORAL | Status: DC
  Administered 2019-06-01 – 2019-06-04 (×7): 237 mL via ORAL

## 2019-06-01 NOTE — Progress Notes (Signed)
Daily Rounding Note  06/01/2019, 11:05 AM  LOS: 1 day   SUBJECTIVE:   Chief complaint:   Pancreatic pseudocyst. Still has abdominal discomfort right upper quadrant.  Tolerating clears, poor appetite.  Had small bowel movement this morning.   OBJECTIVE:         Vital signs in last 24 hours:    Temp:  [97.5 F (36.4 C)-100 F (37.8 C)] 100 F (37.8 C) (04/28 0751) Pulse Rate:  [92-97] 96 (04/28 0751) Resp:  [15-17] 15 (04/28 0751) BP: (100-127)/(56-85) 127/85 (04/28 0751) SpO2:  [92 %-96 %] 95 % (04/28 0751) Last BM Date: 06/01/19 Filed Weights   05/30/19 0626  Weight: 97.6 kg   General: Pale, comfortable, alert Heart: RRR, valve click. Chest: Clear bilaterally without labored breathing Abdomen: Soft, not distended.  Some tenderness in the right abdomen Extremities: No CCE. Neuro/Psych: Good historian.  No gross deficits, fluid speech.  Fully alert and oriented  Intake/Output from previous day: 04/27 0701 - 04/28 0700 In: 2772 [P.O.:460; I.V.:2212; IV Piggyback:100] Out: 650 [Urine:650]  Intake/Output this shift: No intake/output data recorded.  Lab Results: Recent Labs    05/30/19 0410 05/30/19 0410 05/31/19 0739 05/31/19 1430 06/01/19 0638  WBC 13.0*  --  11.6*  --  10.4  HGB 8.8*   < > 8.7* 8.2* 8.3*  HCT 27.6*   < > 27.7* 25.4* 25.8*  PLT 311  --  340  --  329   < > = values in this interval not displayed.   BMET Recent Labs    05/30/19 0410 05/31/19 0739 06/01/19 0638  NA 134* 136 135  K 4.5 4.3 4.1  CL 102 105 104  CO2 22 23 23   GLUCOSE 245* 198* 175*  BUN 18 19 18   CREATININE 1.14 1.04 0.99  CALCIUM 8.2* 8.2* 8.0*   LFT Recent Labs    05/30/19 0410 05/31/19 0739 06/01/19 0638  PROT 6.4* 6.1* 5.7*  ALBUMIN 2.4* 2.1* 1.8*  AST 14* 22 35  ALT 14 16 25   ALKPHOS 57 57 59  BILITOT 1.1 1.0 1.0  BILIDIR  --  0.3*  --   IBILI  --  0.7  --    PT/INR Recent Labs     05/31/19 0739 06/01/19 0638  LABPROT 25.4* 22.4*  INR 2.3* 2.1*   Hepatitis Panel No results for input(s): HEPBSAG, HCVAB, HEPAIGM, HEPBIGM in the last 72 hours.  Studies/Results: No results found.  ASSESMENT:   *    Abdominal pain.  CT abdomen with tail of pancreas lesion.   MRI reveals hemorrhagic pseudocyst in tail of pancreas, small fluid collection region of anterior stomach wall likely small pseudocyst cholelithiasis, mild GB wall thickening, ducts normal.   ? No active bleding per imaging?.  Large pseudocyst noted on CT ang[facility] at [facility] in [facility]/2019.. ?  Symptomatic gallstones? Minor T bili elevation to 1.7, now normal and otherwise normal LFTs. Lipase max 71 CA 19-9 5, CEA 1.0.   Surgery suspect this is not a malignant mass and is either secondary to gallstone pancreatitis versus benign cyst that bled.  No plans for laparoscopic cholecystectomy at present.  Surgery recommends repeat scanning 4 to 6 weeks.  *    Chronic Coumadin owing to history of stroke and previous AVR. INR 4.1 >> 2.1.  Coumadin is on hold, heparin infusion initiated 11 this morning  *    Anemia.  Normocytic. Hgb 10 >> 8.7 >> 8.3.  PLAN   *    Vascular surgery consult.   Spoke with PA for Dr.Cain who is currently in OR case.  He is going to review the case and get back to Dr. Deniece Ree  06/01/2019, 11:05 AM Phone [phone]

## 2019-06-01 NOTE — Progress Notes (Signed)
ANTICOAGULATION CONSULT NOTE - Follow Up Consult  Pharmacy Consult for Heparin Indication: Coumadin on hold, Mechanical valve  Allergies  Allergen Reactions  . Codeine Anaphylaxis  . Gadolinium Derivatives Other (See Comments)    Other Reaction: intense sneezing MRI contrast  . Hydrocodone Anaphylaxis  . Iodinated Diagnostic Agents Other (See Comments)    Uncontrollable sneezing, needs sedative the night before and hour and benadryl before using Other Reaction: sneezing with SOB after prep  . Statins Other (See Comments)    Myalgias  . Hydromorphone Other (See Comments)  . Lipitor [Atorvastatin Calcium] Other (See Comments)    Myalgias   . Seroquel [Quetiapine Fumerate] Other (See Comments)    "bad trip" tongue swell    Patient Measurements: Height: 6\' 1"  (185.4 cm) Weight: 97.6 kg (215 lb 2.7 oz) IBW/kg (Calculated) : 79.9  Vital Signs: Temp: 100 F (37.8 C) (04/28 0751) Temp Source: Oral (04/28 0751) BP: 127/85 (04/28 0751) Pulse Rate: 96 (04/28 0751)  Labs: Recent Labs    05/30/19 0058 05/30/19 0119 05/30/19 0410 05/30/19 0410 05/31/19 0739 05/31/19 0739 05/31/19 1430 06/01/19 0638  HGB   < >  --  8.8*   < > 8.7*   < > 8.2* 8.3*  HCT   < >  --  27.6*   < > 27.7*  --  25.4* 25.8*  PLT   < >  --  311  --  340  --   --  329  LABPROT  --  39.6*  --   --  25.4*  --   --  22.4*  INR  --  4.1*  --   --  2.3*  --   --  2.1*  CREATININE   < >  --  1.14  --  1.04  --   --  0.99   < > = values in this interval not displayed.    Estimated Creatinine Clearance: 85.4 mL/min (by C-G formula based on SCr of 0.99 mg/dL).  Assessment: 69 year old male on warfarin prior to admission for aortic valve.  Admitted with abdominal pain and hemorrhagic cyst of the pancreas with plans for conservative treatment for now.  INR now 2.1, will begin heparin at a lower rate   Goal INR on warfarin is 2 to 2.5 per last out patient anti-coag visit  Goal of Therapy:  Heparin level  0.3-0.7 units/ml Monitor platelets by anticoagulation protocol: Yes   Plan:  Heparin at 1150 units / hr  8 hour heparin level Daily heparin level, CBC  Thank you Anette Guarneri, PharmD [phone]  06/01/2019,8:31 AM

## 2019-06-01 NOTE — Consult Note (Signed)
Hospital Consult    Reason for Consult: History of thoracic aortic dissection s/p TEVAR Referring Physician:  Dr. Bryan Lemma MRN #:  [id number]  History of Present Illness: This is a 69 y.o. male has a history of a prior aortic valve replacement and most recently underwent sternotomy with total arch replacement on October 29 of last year with subsequent left carotid subclavian bypass and thoracic endograft doing for type B dissection.  This was performed on November 5.  Patient was readmitted with UTI treated with antibiotics.  He is chronically anticoagulated and is now admitted to [facility] with a 1 month history of abdominal pain.  He has been found to have large pancreatic mass likely hemorrhagic pseudocyst and now general surgery is following.  States that he has abdominal pain has had no appetite of late.  Most of his discomfort is centered around his epigastric or right upper quadrant area.   Past Medical History:  Diagnosis Date  . Aortic aneurysm, thoracic ([place])   . Aortic valve disease   . Arthritis   . BPH (benign prostatic hyperplasia)   . COPD (chronic obstructive pulmonary disease) ([place])   . GERD (gastroesophageal reflux disease)   . HTN (hypertension)   . Hypercholesterolemia   . Stroke ([place])   . TIA (transient ischemic attack)     Past Surgical History:  Procedure Laterality Date  . ACHILLES TENDON REPAIR    . AORTIC VALVE REPLACEMENT     #23 On-X valve conduit  . ASCENDING AORTIC ANEURYSM REPAIR    . BACK SURGERY    . FOOT SURGERY      Allergies  Allergen Reactions  . Codeine Anaphylaxis  . Gadolinium Derivatives Other (See Comments)    Other Reaction: intense sneezing MRI contrast  . Hydrocodone Anaphylaxis  . Iodinated Diagnostic Agents Other (See Comments)    Uncontrollable sneezing, needs sedative the night before and hour and benadryl before using Other Reaction: sneezing with SOB after prep  . Statins Other (See Comments)    Myalgias  .  Hydromorphone Other (See Comments)  . Lipitor [Atorvastatin Calcium] Other (See Comments)    Myalgias   . Seroquel [Quetiapine Fumerate] Other (See Comments)    "bad trip" tongue swell    Prior to Admission medications   Medication Sig Start Date End Date Taking? Authorizing Provider  acetaminophen (TYLENOL) 500 MG tablet Take 1,000 mg by mouth every 6 (six) hours as needed for mild pain.   Yes [provider]  albuterol (VENTOLIN HFA) 108 (90 Base) MCG/ACT inhaler Inhale 2 puffs into the lungs every 4 (four) hours as needed for wheezing or shortness of breath. 09/10/18  Yes Leone Haven, MD  amoxicillin (AMOXIL) 500 MG capsule Take 2,000 mg by mouth See admin instructions. One hour prior to dental appointment 03/28/19  Yes [provider]  aspirin 81 MG tablet Take 81 mg by mouth at bedtime.    Yes [provider]  budesonide-formoterol (SYMBICORT) 160-4.5 MCG/ACT inhaler TAKE 2 PUFFS BY MOUTH TWICE A DAY Patient taking differently: Inhale 2 puffs into the lungs in the morning and at bedtime.  06/11/18  Yes Flora Lipps, MD  clobetasol ointment (TEMOVATE) [id number] % Apply 1 application topically daily as needed for rash. 05/25/19  Yes [provider]  furosemide (LASIX) 20 MG tablet TAKE 1/2 TABLET (10 MG) 1 OR 2 TIMES A DAY AS NEEDED FOR LEG SWELLING Patient taking differently: Take 10 mg by mouth 2 (two) times daily as needed for fluid  or edema.  09/01/18  Yes Leone Haven, MD  gentamicin ointment (GARAMYCIN) 0.1 % Apply 1 application topically daily.  05/16/19  Yes [provider]  Investigational - Study Medication Take 1 tablet by mouth daily. Study name: [id number] Additional study details: cholesterol [facility] [phone]   Yes [provider]  Multiple Vitamin (MULTIVITAMIN WITH MINERALS) TABS tablet Take 1 tablet by mouth daily.   Yes [provider]  OVER THE COUNTER MEDICATION Take 1 capsule by mouth daily.  Dotera Digestive Supplement   Yes [provider]  phenylephrine (NEO-SYNEPHRINE) 0.25 % nasal spray Place 2 sprays into both nostrils daily as needed (nose bleeds).   Yes [provider]  polyethylene glycol (MIRALAX / GLYCOLAX) 17 g packet Take 17 g by mouth daily.   Yes [provider]  predniSONE (DELTASONE) 50 MG tablet Take 1 tablet (50 mg) by mouth 13 hours prior to your scan, then take 1 tablet (50 mg) by mouth 7 hours prior to scan, then take 1 tablet (50 mg) by mouth 1 hour prior to your scan 05/25/19  Yes Leone Haven, MD  senna (SENOKOT) 8.6 MG TABS tablet Take 2 tablets by mouth in the morning and at bedtime.   Yes [provider]  tamsulosin (FLOMAX) 0.4 MG CAPS capsule TAKE 2 CAPSULES BY MOUTH EVERY DAY Patient taking differently: Take 0.4 mg by mouth in the morning and at bedtime.  04/05/19  Yes Leone Haven, MD  Tiotropium Bromide Monohydrate (SPIRIVA RESPIMAT) 2.5 MCG/ACT AERS INHALE 2.5 MCG INTO THE LUNGS 2 (TWO) TIMES DAILY. Patient taking differently: Inhale 1 puff into the lungs in the morning and at bedtime.  06/11/18  Yes Flora Lipps, MD  traMADol (ULTRAM) 50 MG tablet Take 50 mg by mouth every 4 (four) hours as needed for moderate pain.    Yes [provider]  warfarin (COUMADIN) 2 MG tablet TAKE 1 TABLET WITH 5 MG TABLET DAILY AS DIRECTED BY COUMADIN CLINIC Patient taking differently: Take 2 mg by mouth See admin instructions. Take 2mg  daily except on TUES (total daily dose of 7mg ) 05/16/19  Yes [name], [name] M, MD  warfarin (COUMADIN) 5 MG tablet TAKE 1 TABLET DAILY AS DIRECTED WITH 2 MG TABLET Patient taking differently: Take 5 mg by mouth daily at 4 PM. (Take with 2mg  daily except on TUES) 01/24/19  Yes [name], [name] M, MD  Zinc 50 MG CAPS Take 1 capsule by mouth daily.   Yes [provider]  benzonatate (TESSALON) 100 MG capsule Take 1 or 2 capsules every 8 hours as needed for cough Patient not taking: Reported  on 05/30/2019 03/29/18   Jodelle Green, FNP  metoprolol succinate (TOPROL XL) 25 MG 24 hr tablet Take 1 tablet (25 mg total) by mouth daily. Patient not taking: Reported on 05/30/2019 02/01/19   Pixie Casino, MD    Social History   Socioeconomic History  . Marital status: Married    Spouse name: Not on file  . Number of children: 2  . Years of education: Not on file  . Highest education level: Not on file  Occupational History  . Occupation: Scientist, clinical (histocompatibility and immunogenetics): [employer]  Tobacco Use  . Smoking status: Never Smoker  . Smokeless tobacco: Never Used  Substance and Sexual Activity  . Alcohol use: No    Alcohol/week: 0.0 standard drinks  . Drug use: No  . Sexual activity: Not Currently  Other Topics Concern  .  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   One son and one daughter   He lives in [place]   One caffeinated beverage daily   08/30/2014   Social Determinants of Health   Financial Resource Strain:   . Difficulty of Paying Living Expenses:   Food Insecurity:   . Worried About Charity fundraiser in the Last Year:   . Arboriculturist in the Last Year:   Transportation Needs:   . Film/video editor (Medical):   Marland Kitchen Lack of Transportation (Non-Medical):   Physical Activity:   . Days of Exercise per Week:   . Minutes of Exercise per Session:   Stress:   . Feeling of Stress :   Social Connections:   . Frequency of Communication with Friends and Family:   . Frequency of Social Gatherings with Friends and Family:   . Attends Religious Services:   . Active Member of Clubs or Organizations:   . Attends Archivist Meetings:   Marland Kitchen Marital Status:   Intimate Partner Violence:   . Fear of Current or Ex-Partner:   . Emotionally Abused:   Marland Kitchen Physically Abused:   . Sexually Abused:      Family History  Problem Relation Age of Onset  . Alzheimer's disease  Father   . Diabetes Father        age onset DM  . Hypertension Sister   . Hypertension Brother   . Diabetes Mother   . Diabetes Sister   . Colon cancer Neg Hx     ROS:  Cardiovascular: []  chest pain/pressure []  palpitations []  SOB lying flat []  DOE []  pain in legs while walking []  pain in legs at rest []  pain in legs at night []  non-healing ulcers []  hx of DVT []  swelling in legs  Pulmonary: []  productive cough []  asthma/wheezing []  home O2  Neurologic: []  weakness in []  arms []  legs []  numbness in []  arms []  legs []  hx of CVA []  mini stroke [] difficulty speaking or slurred speech []  temporary loss of vision in one eye []  dizziness  Hematologic: []  hx of cancer []  bleeding problems []  problems with blood clotting easily  Endocrine:   []  diabetes []  thyroid disease  GI [x]  abdominal pain [x]  loss of appetite  GU: []  CKD/renal failure []  HD--[]  M/W/F or []  T/T/S []  burning with urination []  blood in urine  Psychiatric: []  anxiety []  depression  Musculoskeletal: []  arthritis []  joint pain  Integumentary: []  rashes []  ulcers  Constitutional: []  fever []  chills   Physical Examination  Vitals:   06/01/19 0316 06/01/19 0751  BP: (!) 100/58 127/85  Pulse: 93 96  Resp: 16 15  Temp: (!) 97.5 F (36.4 C) 100 F (37.8 C)  SpO2: 96% 95%   Body mass index is 28.39 kg/m.  General:  nad HENT: WNL, normocephalic Pulmonary: normal non-labored breathing Cardiac: Palpable bilateral radial, femoral, anterior tibial pulses Abdomen: Soft with mild distention and moderate tenderness to palpation throughout Extremities: Warm and well-perfused Musculoskeletal: no muscle wasting or atrophy  Neurologic: A&O X 3   CBC    Component Value Date/Time   WBC 10.4 06/01/2019 0638   RBC 2.88 (L) 06/01/2019 0638   HGB 8.3 (L) 06/01/2019 0638   HGB 9.3 (L) 01/10/2019 1228   HCT 25.8 (L) 06/01/2019 0638   HCT 28.4 (L) 01/10/2019 1228  PLT 329 06/01/2019  0638   PLT 305 01/10/2019 1228   MCV 89.6 06/01/2019 0638   MCV 90 01/10/2019 1228   MCH 28.8 06/01/2019 0638   MCHC 32.2 06/01/2019 0638   RDW 15.0 06/01/2019 0638   RDW 14.3 01/10/2019 1228   LYMPHSABS 1.0 05/25/2019 1140   MONOABS 0.5 05/25/2019 1140   EOSABS 0.1 05/25/2019 1140   BASOSABS 0.1 05/25/2019 1140    BMET    Component Value Date/Time   NA 135 06/01/2019 0638   NA 137 01/10/2019 1228   K 4.1 06/01/2019 0638   CL 104 06/01/2019 0638   CO2 23 06/01/2019 0638   GLUCOSE 175 (H) 06/01/2019 0638   BUN 18 06/01/2019 0638   BUN 14 01/10/2019 1228   CREATININE 0.99 06/01/2019 0638   CALCIUM 8.0 (L) 06/01/2019 0638   GFRNONAA >60 06/01/2019 0638   GFRAA >60 06/01/2019 [id number]    COAGS: Lab Results  Component Value Date   INR 2.1 (H) 06/01/2019   INR 2.3 (H) 05/31/2019   INR 4.1 (HH) 05/30/2019     Non-Invasive Vascular Imaging:   CT abdomen/pelvis IMPRESSION: 1. Cholelithiasis without evidence of acute cholecystitis. 2. 9.6 cm x 5.5 cm well-defined area of heterogeneous low attenuation is again seen within the tail of the pancreas. Further evaluation with MRI is recommended. 3. Interval development of mild right upper quadrant inflammatory fat stranding since the prior study. This may represent sequelae associated with mild gastritis within the region of the gastric antrum. 4. Moderate to marked severity aortic calcification and atherosclerosis with chronic, partially calcified dissection extending along the length of the abdominal aorta to the level of the bifurcation. 5. 3.2 cm right renal cyst. 6. Mild-to-moderate amount of posterior pelvic free fluid, increased in severity when compared to the prior study.   ASSESSMENT/PLAN: This is a 69 y.o. male with vascular history as noted above.  In review of recent CT scan does have chronic dissection in the abdominal aorta and this has been followed by his cardiothoracic surgeon at [facility].  Patient does not  require any vascular invention at this time and if he were to need vascular invention would want this performed at [facility] which I think is certainly reasonable.  I will be available as needed while patient is inpatient.   Uchechukwu Dhawan C. Donzetta Matters, MD [facility] Office: [phone] Pager: [phone]

## 2019-06-01 NOTE — Progress Notes (Signed)
PROGRESS NOTE    Alexander Barton  [id number] DOB: 1951/01/21 DOA: 05/29/2019 PCP: Leone Haven, MD   Brief Narrative:  HPI On 05/30/2019 by Dr. Bunnie Pion Alexander Barton is a 69 y.o. male with medical history significant of hypertension, hyperlipidemia, aortic valve replacement, stroke and COPD presented to ED for evaluation of worsening nominal pain with nausea and vomiting.  Patient states that the pain is ongoing for more than 2 weeks but continue to worsen since last week.  Pain is located mainly in epigastric region but become generalized and upper abdomen and is related with severe nausea and vomiting patient further mentioned that he is unable to eat and drink for the last few days because of worsening nausea and vomiting.  Patient further mentioned that he had a CT scan last week and it showed a new pancreatic mass.  Patient is scheduled for gastroenterology appointment on Tuesday.  Patient otherwise denies fever, chills, chest pain, shortness of breath and urinary symptoms.    Interim history Admitted for abdominal pain that is intractable, likely secondary to gallstones, pancreatitis with pseudocyst.  General surgery and gastroenterology consulted and appreciated.  No plans for laparoscopic cholecystectomy at this time.  Patient started on IV ceftriaxone empirically. Assessment & Plan   Intractable abdominal pain -Likely secondary to gallstones causing pancreatitis and pancreatic pseudocyst with intracystic hemorrhage -General surgery as well as gastroenterology consulted and appreciated -Continue IV pain control and liquids -Patient placed on Rocephin empirically  Complex hemorrhagic cyst of the pancreas -Noted on MRI -As above GI and surgery on board -Continue conservative treatment -Gastroenterology would like vascular surgery to weigh in -Patient currently on Coumadin for mechanical aortic valve.  Coumadin is currently on hold  Cholelithiasis with  gallbladder sludge -CT scan and ultrasound performed, MRI abdomen shows mild gallbladder wall thickening and inflammation -Conservative management for now with possibility of surgery in 4 to 6 weeks  Supratherapeutic INR -INR was up to 4.1, currently down to 2.1 -Coumadin currently on hold  History of mechanical aortic valve replacement/stroke -As above, Coumadin currently held, patient may need heparin drip  COPD -Currently compensated, no wheezing noted on exam  Hyperglycemia/New onset diabetes mellitus, type II -Patient denies history of diabetes -A1c in 2018 was 6.1, repeat A1c on 05/30/2019 was 7.3 -Placed on insulin sliding scale for now -Patient will likely benefit from home oral hypoglycemics on discharge  DVT Prophylaxis  Therapeutic INR  Code Status: Full   Family Communication: None at bedside  Disposition Plan:  Status is: Inpatient  Remains inpatient appropriate because:Ongoing diagnostic testing needed not appropriate for outpatient work up as needed for IV antibiotics and pain control.  Pending vascular consultation.  Dispo: The patient is from: Home              Anticipated d/c is to: Home              Anticipated d/c date is: 2 days              Patient currently is not medically stable to d/c.  Consultants General Surgery Gastroenterology  Procedures  None  Antibiotics   Anti-infectives (From admission, onward)   Start     Dose/Rate Route Frequency Ordered Stop   05/31/19 1200  cefTRIAXone (ROCEPHIN) 2 g in sodium chloride 0.9 % 100 mL IVPB     2 g 200 mL/hr over 30 Minutes Intravenous Every 24 hours 05/31/19 1100        Subjective:   Alexander Saxon  Barton seen and examined today.  Feels that if he had a good bowel movement his abdominal pain would improve.  Complains of right-sided abdominal pain.  Denies current nausea or vomiting.  Denies chest pain or shortness of breath, denies dizziness or headache.  Complain of 4 appetite.  Objective:    Vitals:   05/31/19 2108 06/01/19 0316 06/01/19 0751 06/01/19 1346  BP: (!) 118/56 (!) 100/58 127/85 111/60  Pulse: 92 93 96 81  Resp: 17 16 15 17   Temp: 98.4 F (36.9 C) (!) 97.5 F (36.4 C) 100 F (37.8 C) (!) 100.9 F (38.3 C)  TempSrc: Oral Oral Oral Oral  SpO2: 95% 96% 95% (!) 88%  Weight:      Height:        Intake/Output Summary (Last 24 hours) at 06/01/2019 1450 Last data filed at 06/01/2019 1100 Gross per 24 hour  Intake 2791.98 ml  Output 500 ml  Net 2291.98 ml   Filed Weights   05/30/19 0626  Weight: 97.6 kg    Exam  General: Well developed, well nourished, NAD, appears stated age  58: NCAT,  mucous membranes moist.   Cardiovascular: S1 S2 auscultated, RRR,+click  Respiratory: Clear to auscultation bilaterally with equal chest rise  Abdomen: Soft, Right sided TTP, nondistended, + bowel sounds  Extremities: warm dry without cyanosis clubbing or edema  Neuro: AAOx3, nonfocal  Psych: Normal affect and demeanor with intact judgement and insight   Data Reviewed: I have personally reviewed following labs and imaging studies  CBC: Recent Labs  Lab 05/29/19 1428 05/29/19 1428 05/30/19 0058 05/30/19 0410 05/31/19 0739 05/31/19 1430 06/01/19 0638  WBC 12.9*  --  13.1* 13.0* 11.6*  --  10.4  HGB 10.0*   < > 8.5* 8.8* 8.7* 8.2* 8.3*  HCT 31.1*   < > 26.9* 27.6* 27.7* 25.4* 25.8*  MCV 87.9  --  89.7 88.5 89.6  --  89.6  PLT 347  --  302 311 340  --  329   < > = values in this interval not displayed.   Basic Metabolic Panel: Recent Labs  Lab 05/29/19 1428 05/30/19 0058 05/30/19 0410 05/31/19 0739 06/01/19 0638  NA 133*  --  134* 136 135  K 4.9  --  4.5 4.3 4.1  CL 97*  --  102 105 104  CO2 24  --  22 23 23   GLUCOSE 241*  --  245* 198* 175*  BUN 20  --  18 19 18   CREATININE 1.30* 1.14 1.14 1.04 0.99  CALCIUM 9.2  --  8.2* 8.2* 8.0*  MG  --   --   --   --  2.1   GFR: Estimated Creatinine Clearance: 85.4 mL/min (by C-G formula based  on SCr of 0.99 mg/dL). Liver Function Tests: Recent Labs  Lab 05/29/19 1428 05/30/19 0410 05/31/19 0739 06/01/19 0638  AST 19 14* 22 35  ALT 14 14 16 25   ALKPHOS 71 57 57 59  BILITOT 1.7* 1.1 1.0 1.0  PROT 7.5 6.4* 6.1* 5.7*  ALBUMIN 3.0* 2.4* 2.1* 1.8*   Recent Labs  Lab 05/29/19 1428  LIPASE 71*   No results for input(s): AMMONIA in the last 168 hours. Coagulation Profile: Recent Labs  Lab 05/30/19 0119 05/31/19 0739 06/01/19 0638  INR 4.1* 2.3* 2.1*   Cardiac Enzymes: No results for input(s): CKTOTAL, CKMB, CKMBINDEX, TROPONINI in the last 168 hours. BNP (last 3 results) No results for input(s): PROBNP in the last 8760 hours. HbA1C: Recent  Labs    05/30/19 0410  HGBA1C 7.3*   CBG: Recent Labs  Lab 05/31/19 1156 05/31/19 1722 05/31/19 2158 06/01/19 0748 06/01/19 1224  GLUCAP 223* 168* 169* 171* 147*   Lipid Profile: No results for input(s): CHOL, HDL, LDLCALC, TRIG, CHOLHDL, LDLDIRECT in the last 72 hours. Thyroid Function Tests: No results for input(s): TSH, T4TOTAL, FREET4, T3FREE, THYROIDAB in the last 72 hours. Anemia Panel: No results for input(s): VITAMINB12, FOLATE, FERRITIN, TIBC, IRON, RETICCTPCT in the last 72 hours. Urine analysis:    Component Value Date/Time   COLORURINE YELLOW 05/29/2019 2120   APPEARANCEUR HAZY (A) 05/29/2019 2120   LABSPEC >1.046 (H) 05/29/2019 2120   PHURINE 5.0 05/29/2019 2120   GLUCOSEU 50 (A) 05/29/2019 2120   HGBUR NEGATIVE 05/29/2019 2120   BILIRUBINUR NEGATIVE 05/29/2019 2120   BILIRUBINUR moderate 01/11/2019 1302   KETONESUR 5 (A) 05/29/2019 2120   PROTEINUR 100 (A) 05/29/2019 2120   UROBILINOGEN 4.0 (A) 01/11/2019 1302   UROBILINOGEN 0.2 06/04/2012 0027   NITRITE NEGATIVE 05/29/2019 2120   LEUKOCYTESUR NEGATIVE 05/29/2019 2120   Sepsis Labs: @LABRCNTIP (procalcitonin:4,lacticidven:4)  ) Recent Results (from the past 240 hour(s))  SARS CORONAVIRUS 2 (TAT 6-24 HRS) Nasopharyngeal Nasopharyngeal Swab      Status: None   Collection Time: 05/30/19 12:25 AM   Specimen: Nasopharyngeal Swab  Result Value Ref Range Status   SARS Coronavirus 2 NEGATIVE NEGATIVE Final    Comment: (NOTE) SARS-CoV-2 target nucleic acids are NOT DETECTED. The SARS-CoV-2 RNA is generally detectable in upper and lower respiratory specimens during the acute phase of infection. Negative results do not preclude SARS-CoV-2 infection, do not rule out co-infections with other pathogens, and should not be used as the sole basis for treatment or other patient management decisions. Negative results must be combined with clinical observations, patient history, and epidemiological information. The expected result is Negative. Fact Sheet for Patients: SugarRoll.be Fact Sheet for Healthcare Providers: https://www.woods-mathews.com/ This test is not yet approved or cleared by the [place] FDA and  has been authorized for detection and/or diagnosis of SARS-CoV-2 by FDA under an Emergency Use Authorization (EUA). This EUA will remain  in effect (meaning this test can be used) for the duration of the COVID-19 declaration under Section 56 4(b)(1) of the Act, 21 U.S.C. section 360bbb-3(b)(1), unless the authorization is terminated or revoked sooner. Performed at [facility], [place] [address] [place], [place] [address]       Radiology Studies: No results found.   Scheduled Meds: . feeding supplement (ENSURE ENLIVE)  237 mL Oral TID BM  . insulin aspart  0-5 Units Subcutaneous QHS  . insulin aspart  0-9 Units Subcutaneous TID WC  . sodium chloride flush  3 mL Intravenous Once   Continuous Infusions: . sodium chloride 100 mL/hr at 06/01/19 0835  . cefTRIAXone (ROCEPHIN)  IV 2 g (06/01/19 1114)  . heparin 1,150 Units/hr (06/01/19 1108)     LOS: 1 day   Time Spent in minutes   45 minutes  Sena Hoopingarner D.O. on 06/01/2019 at 2:50 PM  Between 7am to 7pm - Please  see pager noted on [email or web address]  After 7pm go to [email or web address]  And look for the night coverage person covering for me after hours  [facility] Office  [phone]

## 2019-06-01 NOTE — Progress Notes (Signed)
   Subjective/Chief Complaint: Still with abdominal pain Bloating with liquids   Objective: Vital signs in last 24 hours: Temp:  [97.5 F (36.4 C)-100 F (37.8 C)] 100 F (37.8 C) (04/28 0751) Pulse Rate:  [92-112] 96 (04/28 0751) Resp:  [15-18] 15 (04/28 0751) BP: (100-129)/(56-85) 127/85 (04/28 0751) SpO2:  [92 %-97 %] 95 % (04/28 0751) Last BM Date: 05/30/19  Intake/Output from previous day: 04/27 0701 - 04/28 0700 In: 2772 [P.O.:460; I.V.:2212; IV Piggyback:100] Out: 650 [Urine:650] Intake/Output this shift: No intake/output data recorded.  Exam: Awake and alert Still full and tender in the epigastrium  Lab Results:  Recent Labs    05/31/19 0739 05/31/19 0739 05/31/19 1430 06/01/19 0638  WBC 11.6*  --   --  10.4  HGB 8.7*   < > 8.2* 8.3*  HCT 27.7*   < > 25.4* 25.8*  PLT 340  --   --  329   < > = values in this interval not displayed.   BMET Recent Labs    05/31/19 0739 06/01/19 0638  NA 136 135  K 4.3 4.1  CL 105 104  CO2 23 23  GLUCOSE 198* 175*  BUN 19 18  CREATININE 1.04 0.99  CALCIUM 8.2* 8.0*   PT/INR Recent Labs    05/31/19 0739 06/01/19 0638  LABPROT 25.4* 22.4*  INR 2.3* 2.1*   ABG No results for input(s): PHART, HCO3 in the last 72 hours.  Invalid input(s): PCO2, PO2  Studies/Results: No results found.  Anti-infectives: Anti-infectives (From admission, onward)   Start     Dose/Rate Route Frequency Ordered Stop   05/31/19 1200  cefTRIAXone (ROCEPHIN) 2 g in sodium chloride 0.9 % 100 mL IVPB     2 g 200 mL/hr over 30 Minutes Intravenous Every 24 hours 05/31/19 1100        Assessment/Plan: Pancreatitic pseudocyst and gallstones  Again, suspect this is not malignant and is either related to gallstone pancreatitis vs a benign cyst that bled. Issue remains his ability to take po.  Will add Ensure shakes. No plans for lap chole this admission Follow-up scan in 4 to 6 weeks   We will continue to follow while he is here  and as an outpt  LOS: 1 day    Coralie Keens 06/01/2019

## 2019-06-01 NOTE — Progress Notes (Signed)
ANTICOAGULATION CONSULT NOTE - Follow Up Consult  Pharmacy Consult for Heparin Indication: Coumadin on hold, Mechanical valve  Allergies  Allergen Reactions  . Codeine Anaphylaxis  . Gadolinium Derivatives Other (See Comments)    Other Reaction: intense sneezing MRI contrast  . Hydrocodone Anaphylaxis  . Iodinated Diagnostic Agents Other (See Comments)    Uncontrollable sneezing, needs sedative the night before and hour and benadryl before using Other Reaction: sneezing with SOB after prep  . Statins Other (See Comments)    Myalgias  . Hydromorphone Other (See Comments)  . Lipitor [Atorvastatin Calcium] Other (See Comments)    Myalgias   . Seroquel [Quetiapine Fumerate] Other (See Comments)    "bad trip" tongue swell    Patient Measurements: Height: 6\' 1"  (185.4 cm) Weight: 97.6 kg (215 lb 2.7 oz) IBW/kg (Calculated) : 79.9  Vital Signs: Temp: 100.9 F (38.3 C) (04/28 1346) Temp Source: Oral (04/28 1346) BP: 111/60 (04/28 1346) Pulse Rate: 81 (04/28 1346)  Labs: Recent Labs    05/30/19 0058 05/30/19 0119 05/30/19 0410 05/30/19 0410 05/31/19 0739 05/31/19 0739 05/31/19 1430 05/31/19 1430 06/01/19 0638 06/01/19 1624 06/01/19 1830  HGB   < >  --  8.8*   < > 8.7*   < > 8.2*   < > 8.3* 8.3*  --   HCT   < >  --  27.6*   < > 27.7*   < > 25.4*  --  25.8* 25.9*  --   PLT   < >  --  311  --  340  --   --   --  329  --   --   LABPROT  --  39.6*  --   --  25.4*  --   --   --  22.4*  --   --   INR  --  4.1*  --   --  2.3*  --   --   --  2.1*  --   --   HEPARINUNFRC  --   --   --   --   --   --   --   --   --   --  <0.10*  CREATININE   < >  --  1.14  --  1.04  --   --   --  0.99  --   --    < > = values in this interval not displayed.    Estimated Creatinine Clearance: 85.4 mL/min (by C-G formula based on SCr of 0.99 mg/dL).  Assessment: 69 year old male on warfarin prior to admission for aortic valve.  Admitted with abdominal pain and hemorrhagic cyst of the pancreas  with plans for conservative treatment for now.  INR now 2.1, will begin heparin at a lower rate   Goal INR on warfarin is 2 to 2.5 per last out patient anti-coag visit  Heparin level came back <0.1 tonight. Rn stated that infusion is running fine. INR 2.1 so ok with coumadin on hold. We will increase rate then check level in AM.   Goal of Therapy:  Heparin level 0.3-0.7 units/ml Monitor platelets by anticoagulation protocol: Yes   Plan:  Increase heparin to 1350 units / hr  Heparin level in AM Daily heparin level, CBC  Onnie Boer, PharmD, BCIDP, AAHIVP, CPP Infectious Disease Pharmacist 06/01/2019 7:51 PM

## 2019-06-01 NOTE — Plan of Care (Signed)
  Problem: Education: Goal: Knowledge of General Education information will improve Description Including pain rating scale, medication(s)/side effects and non-pharmacologic comfort measures Outcome: Progressing   

## 2019-06-02 ENCOUNTER — Inpatient Hospital Stay (HOSPITAL_COMMUNITY): Payer: Medicare PPO

## 2019-06-02 DIAGNOSIS — R109 Unspecified abdominal pain: Secondary | ICD-10-CM | POA: Diagnosis not present

## 2019-06-02 DIAGNOSIS — K863 Pseudocyst of pancreas: Secondary | ICD-10-CM | POA: Diagnosis not present

## 2019-06-02 LAB — BASIC METABOLIC PANEL
Anion gap: 10 (ref 5–15)
BUN: 16 mg/dL (ref 8–23)
CO2: 22 mmol/L (ref 22–32)
Calcium: 8 mg/dL — ABNORMAL LOW (ref 8.9–10.3)
Chloride: 104 mmol/L (ref 98–111)
Creatinine, Ser: 0.94 mg/dL (ref 0.61–1.24)
GFR calc Af Amer: >60 mL/min (ref >60)
GFR calc non Af Amer: >60 mL/min (ref >60)
Glucose, Bld: 159 mg/dL — ABNORMAL HIGH (ref 70–99)
Potassium: 3.9 mmol/L (ref 3.5–5.1)
Sodium: 136 mmol/L (ref 135–145)

## 2019-06-02 LAB — GLUCOSE, CAPILLARY
Glucose-Capillary: 148 mg/dL — ABNORMAL HIGH (ref 70–99)
Glucose-Capillary: 134 mg/dL — ABNORMAL HIGH (ref 70–99)
Glucose-Capillary: 169 mg/dL — ABNORMAL HIGH (ref 70–99)
Glucose-Capillary: 171 mg/dL — ABNORMAL HIGH (ref 70–99)

## 2019-06-02 LAB — HEPARIN LEVEL (UNFRACTIONATED)
Heparin Unfractionated: <0.1 IU/mL — ABNORMAL LOW (ref 0.30–0.70)
Heparin Unfractionated: 0.1 IU/mL — ABNORMAL LOW (ref 0.30–0.70)

## 2019-06-02 LAB — CBC
HCT: 26.4 % — ABNORMAL LOW (ref 39.0–52.0)
Hemoglobin: 8.5 g/dL — ABNORMAL LOW (ref 13.0–17.0)
MCH: 28.3 pg (ref 26.0–34.0)
MCHC: 32.2 g/dL (ref 30.0–36.0)
MCV: 88 fL (ref 80.0–100.0)
Platelets: 342 10*3/uL (ref 150–400)
RBC: 3 MIL/uL — ABNORMAL LOW (ref 4.22–5.81)
RDW: 14.7 % (ref 11.5–15.5)
WBC: 10.6 10*3/uL — ABNORMAL HIGH (ref 4.0–10.5)
nRBC: 0 % (ref 0.0–0.2)

## 2019-06-02 LAB — HEMOGLOBIN AND HEMATOCRIT, BLOOD
HCT: 30.2 % — ABNORMAL LOW (ref 39.0–52.0)
Hemoglobin: 9.8 g/dL — ABNORMAL LOW (ref 13.0–17.0)

## 2019-06-02 LAB — PROTIME-INR
INR: 1.8 — ABNORMAL HIGH (ref 0.8–1.2)
Prothrombin Time: 20.6 seconds — ABNORMAL HIGH (ref 11.4–15.2)

## 2019-06-02 MED ORDER — UMECLIDINIUM BROMIDE 62.5 MCG/INH IN AEPB
1.0000 | INHALATION_SPRAY | Freq: Every day | RESPIRATORY_TRACT | Status: DC
  Administered 2019-06-03 – 2019-06-05 (×3): 1 via RESPIRATORY_TRACT
  Filled 2019-06-02: qty 7

## 2019-06-02 MED ORDER — IPRATROPIUM-ALBUTEROL 0.5-2.5 (3) MG/3ML IN SOLN
3.0000 mL | Freq: Four times a day (QID) | RESPIRATORY_TRACT | Status: DC | PRN
  Administered 2019-06-02: 3 mL via RESPIRATORY_TRACT
  Filled 2019-06-02: qty 3

## 2019-06-02 MED ORDER — TECHNETIUM TC 99M MEBROFENIN IV KIT
5.4000 | PACK | Freq: Once | INTRAVENOUS | Status: AC | PRN
  Administered 2019-06-02: 5.4 via INTRAVENOUS

## 2019-06-02 MED ORDER — TIOTROPIUM BROMIDE MONOHYDRATE 2.5 MCG/ACT IN AERS: 1.0000 | INHALATION_SPRAY | Freq: Every day | RESPIRATORY_TRACT | Status: DC

## 2019-06-02 NOTE — Progress Notes (Signed)
PROGRESS NOTE    Alexander Barton  [id number] DOB: 1950/05/28 DOA: 05/29/2019 PCP: Leone Haven, MD   Brief Narrative:  HPI On 05/30/2019 by Dr. Bunnie Pion Alexander Barton is a 69 y.o. male with medical history significant of hypertension, hyperlipidemia, aortic valve replacement, stroke and COPD presented to ED for evaluation of worsening nominal pain with nausea and vomiting.  Patient states that the pain is ongoing for more than 2 weeks but continue to worsen since last week.  Pain is located mainly in epigastric region but become generalized and upper abdomen and is related with severe nausea and vomiting patient further mentioned that he is unable to eat and drink for the last few days because of worsening nausea and vomiting.  Patient further mentioned that he had a CT scan last week and it showed a new pancreatic mass.  Patient is scheduled for gastroenterology appointment on Tuesday.  Patient otherwise denies fever, chills, chest pain, shortness of breath and urinary symptoms.    Interim history Admitted for abdominal pain that is intractable, likely secondary to gallstones, pancreatitis with pseudocyst.  General surgery and gastroenterology consulted and appreciated.  No plans for laparoscopic cholecystectomy at this time.  Patient started on IV ceftriaxone empirically. Assessment & Plan   Intractable abdominal pain -Likely secondary to gallstones causing pancreatitis and pancreatic pseudocyst with intracystic hemorrhage -General surgery as well as gastroenterology consulted and appreciated -Continue IV pain control and liquids -Patient placed on Rocephin empirically -states he cannot tolerate any medications with codeine and that tramadol has helped him in the past -Continues to have abdominal pain, worse with movement   Complex hemorrhagic cyst of the pancreas -Noted on MRI -As above GI and surgery on board -Continue conservative treatment -Gastroenterology  would like vascular surgery to weigh in -Patient currently on Coumadin for mechanical aortic valve.  Coumadin is currently on hold  Cholelithiasis with gallbladder sludge -CT scan and ultrasound performed, MRI abdomen shows mild gallbladder wall thickening and inflammation -Conservative management for now with possibility of surgery in 4 to 6 weeks -General surgery ordered HIDA scan which is normal, no acute cholecystitis   Supratherapeutic INR -INR was up to 4.1, currently down to 1.8 -Coumadin currently on hold  History of mechanical aortic valve replacement/stroke -As above, Coumadin currently held, now on heparin drip  COPD -Currently compensated, no wheezing noted on exam  Hyperglycemia/New onset diabetes mellitus, type II -Patient denies history of diabetes -A1c in 2018 was 6.1, repeat A1c on 05/30/2019 was 7.3 -Placed on insulin sliding scale for now -Patient will likely benefit from home oral hypoglycemics on discharge  Chronic normocytic anemia -hemoglobin stable, continue to monitor CBC  Chronic abdominal aortic dissection  -Vascular surgery consulted and appreciated, no invention needed at this time. Paitent follows with Cardiothoracic surgeon at [facility].   DVT Prophylaxis  Heparin  Code Status: Full   Family Communication: None at bedside  Disposition Plan:  Status is: Inpatient  Remains inpatient appropriate because:Ongoing diagnostic testing needed not appropriate for outpatient work up as needed for IV antibiotics and pain control.   Dispo: The patient is from: Home              Anticipated d/c is to: Home              Anticipated d/c date is: 2 days              Patient currently is not medically stable to d/c.  Consultants General Surgery Gastroenterology  Procedures  HIDA  Antibiotics   Anti-infectives (From admission, onward)   Start     Dose/Rate Route Frequency Ordered Stop   05/31/19 1200  cefTRIAXone (ROCEPHIN) 2 g in sodium chloride 0.9 %  100 mL IVPB     2 g 200 mL/hr over 30 Minutes Intravenous Every 24 hours 05/31/19 1100        Subjective:   [name] seen and examined today.  Continues to have abdominal pain, especially worse with movement.  States that he is not able to eat or drink much as he has a fullness feeling.  Has had bowel movements.  Denies any nausea or vomiting at this time.  Denies chest pain or shortness of breath, dizziness or headache.  Objective:   Vitals:   06/01/19 1346 06/01/19 2110 06/02/19 0310 06/02/19 0825  BP: 111/60 (!) 140/59 (!) 142/59 (!) 141/64  Pulse: 81 87 79 90  Resp: 17 16 16 16   Temp: (!) 100.9 F (38.3 C) 97.8 F (36.6 C) 97.8 F (36.6 C) 98.7 F (37.1 C)  TempSrc: Oral Oral Oral Oral  SpO2: (!) 88% 97% 97% 98%  Weight:      Height:        Intake/Output Summary (Last 24 hours) at 06/02/2019 1251 Last data filed at 06/02/2019 0900 Gross per 24 hour  Intake 1144.69 ml  Output --  Net 1144.69 ml   Filed Weights   05/30/19 0626  Weight: 97.6 kg   Exam  General: Well developed, well nourished, NAD, appears stated age  10: NCAT, mucous membranes moist.   Cardiovascular: S1 S2 auscultated, RRR, +click  Respiratory: Clear to auscultation bilaterally with equal chest rise  Abdomen: Soft, right-sided TTP, nondistended, + bowel sounds  Extremities: warm dry without cyanosis clubbing or edema  Neuro: AAOx3, focal  Psych: Appropriate mood and affect, pleasant   Data Reviewed: I have personally reviewed following labs and imaging studies  CBC: Recent Labs  Lab 05/30/19 0058 05/30/19 0058 05/30/19 0410 05/30/19 0410 05/31/19 0739 05/31/19 1430 06/01/19 0638 06/01/19 1624 06/02/19 0538  WBC 13.1*  --  13.0*  --  11.6*  --  10.4  --  10.6*  HGB 8.5*   < > 8.8*   < > 8.7* 8.2* 8.3* 8.3* 8.5*  HCT 26.9*   < > 27.6*   < > 27.7* 25.4* 25.8* 25.9* 26.4*  MCV 89.7  --  88.5  --  89.6  --  89.6  --  88.0  PLT 302  --  311  --  340  --  329  --  342    < > = values in this interval not displayed.   Basic Metabolic Panel: Recent Labs  Lab 05/29/19 1428 05/29/19 1428 05/30/19 0058 05/30/19 0410 05/31/19 0739 06/01/19 0638 06/02/19 0538  NA 133*  --   --  134* 136 135 136  K 4.9  --   --  4.5 4.3 4.1 3.9  CL 97*  --   --  102 105 104 104  CO2 24  --   --  22 23 23 22   GLUCOSE 241*  --   --  245* 198* 175* 159*  BUN 20  --   --  18 19 18 16   CREATININE 1.30*   < > 1.14 1.14 1.04 0.99 0.94  CALCIUM 9.2  --   --  8.2* 8.2* 8.0* 8.0*  MG  --   --   --   --   --  2.1  --    < > = values in this interval not displayed.   GFR: Estimated Creatinine Clearance: 90 mL/min (by C-G formula based on SCr of 0.94 mg/dL). Liver Function Tests: Recent Labs  Lab 05/29/19 1428 05/30/19 0410 05/31/19 0739 06/01/19 0638  AST 19 14* 22 35  ALT 14 14 16 25   ALKPHOS 71 57 57 59  BILITOT 1.7* 1.1 1.0 1.0  PROT 7.5 6.4* 6.1* 5.7*  ALBUMIN 3.0* 2.4* 2.1* 1.8*   Recent Labs  Lab 05/29/19 1428  LIPASE 71*   No results for input(s): AMMONIA in the last 168 hours. Coagulation Profile: Recent Labs  Lab 05/30/19 0119 05/31/19 0739 06/01/19 0638 06/02/19 0538  INR 4.1* 2.3* 2.1* 1.8*   Cardiac Enzymes: No results for input(s): CKTOTAL, CKMB, CKMBINDEX, TROPONINI in the last 168 hours. BNP (last 3 results) No results for input(s): PROBNP in the last 8760 hours. HbA1C: No results for input(s): HGBA1C in the last 72 hours. CBG: Recent Labs  Lab 06/01/19 0748 06/01/19 1224 06/01/19 1629 06/01/19 2049 06/02/19 0727  GLUCAP 171* 147* 143* 132* 148*   Lipid Profile: No results for input(s): CHOL, HDL, LDLCALC, TRIG, CHOLHDL, LDLDIRECT in the last 72 hours. Thyroid Function Tests: No results for input(s): TSH, T4TOTAL, FREET4, T3FREE, THYROIDAB in the last 72 hours. Anemia Panel: No results for input(s): VITAMINB12, FOLATE, FERRITIN, TIBC, IRON, RETICCTPCT in the last 72 hours. Urine analysis:    Component Value Date/Time    COLORURINE YELLOW 05/29/2019 2120   APPEARANCEUR HAZY (A) 05/29/2019 2120   LABSPEC >1.046 (H) 05/29/2019 2120   PHURINE 5.0 05/29/2019 2120   GLUCOSEU 50 (A) 05/29/2019 2120   HGBUR NEGATIVE 05/29/2019 2120   BILIRUBINUR NEGATIVE 05/29/2019 2120   BILIRUBINUR moderate 01/11/2019 1302   KETONESUR 5 (A) 05/29/2019 2120   PROTEINUR 100 (A) 05/29/2019 2120   UROBILINOGEN 4.0 (A) 01/11/2019 1302   UROBILINOGEN 0.2 06/04/2012 0027   NITRITE NEGATIVE 05/29/2019 2120   LEUKOCYTESUR NEGATIVE 05/29/2019 2120   Sepsis Labs: @LABRCNTIP (procalcitonin:4,lacticidven:4)  ) Recent Results (from the past 240 hour(s))  SARS CORONAVIRUS 2 (TAT 6-24 HRS) Nasopharyngeal Nasopharyngeal Swab     Status: None   Collection Time: 05/30/19 12:25 AM   Specimen: Nasopharyngeal Swab  Result Value Ref Range Status   SARS Coronavirus 2 NEGATIVE NEGATIVE Final    Comment: (NOTE) SARS-CoV-2 target nucleic acids are NOT DETECTED. The SARS-CoV-2 RNA is generally detectable in upper and lower respiratory specimens during the acute phase of infection. Negative results do not preclude SARS-CoV-2 infection, do not rule out co-infections with other pathogens, and should not be used as the sole basis for treatment or other patient management decisions. Negative results must be combined with clinical observations, patient history, and epidemiological information. The expected result is Negative. Fact Sheet for Patients: SugarRoll.be Fact Sheet for Healthcare Providers: https://www.woods-mathews.com/ This test is not yet approved or cleared by the [place] FDA and  has been authorized for detection and/or diagnosis of SARS-CoV-2 by FDA under an Emergency Use Authorization (EUA). This EUA will remain  in effect (meaning this test can be used) for the duration of the COVID-19 declaration under Section 56 4(b)(1) of the Act, 21 U.S.C. section 360bbb-3(b)(1), unless the  authorization is terminated or revoked sooner. Performed at [facility], [place] [address] [place], [place] [address]       Radiology Studies: NM Hepatobiliary Liver Func  Result Date: 06/02/2019 CLINICAL DATA:  Evaluate for cholecystitis. EXAM: NUCLEAR MEDICINE HEPATOBILIARY IMAGING  TECHNIQUE: Sequential images of the abdomen were obtained out to 60 minutes following intravenous administration of radiopharmaceutical. RADIOPHARMACEUTICALS:  5.4 mCi Tc-58m  Choletec IV COMPARISON:  Ultrasound and CT 05/29/2019, MRI abdomen 05/30/2019 FINDINGS: Prompt uptake and biliary excretion of activity by the liver is seen. Gallbladder activity is visualized at 25 minutes, consistent with patency of cystic duct. Biliary activity passes into small bowel at 10-15 minutes, consistent with patent common bile duct. IMPRESSION: Normal HIDA scan without evidence of acute cholecystitis. Electronically Signed   By: Marin Olp M.D.   On: 06/02/2019 12:44     Scheduled Meds: . feeding supplement (ENSURE ENLIVE)  237 mL Oral TID BM  . insulin aspart  0-5 Units Subcutaneous QHS  . insulin aspart  0-9 Units Subcutaneous TID WC  . sodium chloride flush  3 mL Intravenous Once   Continuous Infusions: . sodium chloride 100 mL/hr at 06/01/19 0835  . cefTRIAXone (ROCEPHIN)  IV 2 g (06/01/19 1114)  . heparin 1,650 Units/hr (06/02/19 0811)     LOS: 2 days   Time Spent in minutes   45 minutes  Aisley Whan D.O. on 06/02/2019 at 12:51 PM  Between 7am to 7pm - Please see pager noted on [email or web address]  After 7pm go to [email or web address]  And look for the night coverage person covering for me after hours  [facility] Office  [phone]

## 2019-06-02 NOTE — Progress Notes (Signed)
ANTICOAGULATION CONSULT NOTE - Follow Up Consult  Pharmacy Consult for Heparin Indication: Coumadin on Hold, Mechanical Valve  Allergies  Allergen Reactions  . Codeine Anaphylaxis  . Gadolinium Derivatives Other (See Comments)    Other Reaction: intense sneezing MRI contrast  . Hydrocodone Anaphylaxis  . Iodinated Diagnostic Agents Other (See Comments)    Uncontrollable sneezing, needs sedative the night before and hour and benadryl before using Other Reaction: sneezing with SOB after prep  . Statins Other (See Comments)    Myalgias  . Hydromorphone Other (See Comments)  . Lipitor [Atorvastatin Calcium] Other (See Comments)    Myalgias   . Seroquel [Quetiapine Fumerate] Other (See Comments)    "bad trip" tongue swell    Patient Measurements: Height: 6\' 1"  (185.4 cm) Weight: 97.6 kg (215 lb 2.7 oz) IBW/kg (Calculated) : 79.9  Heparin Dosing Weight: 97.6 kg  Vital Signs: Temp: 100.1 F (37.8 C) (04/29 1312) Temp Source: Oral (04/29 1312) BP: 150/80 (04/29 1312) Pulse Rate: 98 (04/29 1312)  Labs: Recent Labs    05/31/19 0739 05/31/19 1430 06/01/19 [id number] 06/01/19 0638 06/01/19 1624 06/01/19 1830 06/02/19 0538 06/02/19 1413  HGB 8.7*   < > 8.3*   < > 8.3*  --  8.5*  --   HCT 27.7*   < > 25.8*  --  25.9*  --  26.4*  --   PLT 340  --  329  --   --   --  342  --   LABPROT 25.4*  --  22.4*  --   --   --  20.6*  --   INR 2.3*  --  2.1*  --   --   --  1.8*  --   HEPARINUNFRC  --   --   --   --   --  <0.10* <0.10* 0.10*  CREATININE 1.04  --  0.99  --   --   --  0.94  --    < > = values in this interval not displayed.    Estimated Creatinine Clearance: 90 mL/min (by C-G formula based on SCr of 0.94 mg/dL).  Assessment: 69 year old male on warfarin PTA for aortic valve was admitted with abdominal pain and hemorrhagic cyst of the pancreas, with plans for conservative treatment for now. HIDA scan today was normal, without evidence of cholecystitis.  INR down to 1.8. Goal  INR on warfarin is 2 to 2.5, per last outpatient anti-coagulation visit.  Heparin level ~6 hrs after heparin infusion was increased to 1650 units/hr was 0.10 units/ml, which is below the goal range for this pt. H/H 8.5/26.4, platelets 342 (CBC stable). Per RN, no issues with IV or bleeding observed.   Goal of Therapy:  Heparin level: 0.3-0.7 units/ml (aiming for lower end of range, 0.3-0.5 units/ml, due to hemorrhagic cyst of pancreas) Monitor platelets by anticoagulation protocol: Yes   Plan:  Increase heparin to 1950 units/hr Check 8-hr heparin level Monitor daily heparin level, CBC Monitor for signs/symptoms of bleeding  Gillermina Hu, PharmD, BCPS, [facility] Clinical Pharmacist 06/02/2019 4:14 PM

## 2019-06-02 NOTE — Progress Notes (Addendum)
Subjective: CC: RUQ abdominal pain Patient reports that yesterday with cld he was getting increased RUQ abdominal pain, nausea and the sensation of feeling full. He notes if he sits still his pain improves to a 2/10 in the RUQ. Passing flatus. Last BM this am.  Objective: Vital signs in last 24 hours: Temp:  [97.8 F (36.6 C)-100.9 F (38.3 C)] 98.7 F (37.1 C) (04/29 0825) Pulse Rate:  [79-90] 90 (04/29 0825) Resp:  [16-17] 16 (04/29 0825) BP: (111-142)/(59-64) 141/64 (04/29 0825) SpO2:  [88 %-98 %] 98 % (04/29 0825) Last BM Date: 06/02/19  Intake/Output from previous day: 04/28 0701 - 04/29 0700 In: 1384.7 [P.O.:1160; I.V.:224.7] Out: -  Intake/Output this shift: No intake/output data recorded.  PE: Gen:  Alert, NAD, pleasant Lungs: Normal rate and effort  Abd: ND, very tender in the RUQ but abdomen remains soft and is without rigidity. Some tenderness of the epigastrium. +BS Ext:  No LE edema  Psych: A&Ox3  Skin: no rashes noted, warm and dry  Lab Results:  Recent Labs    06/01/19 0638 06/01/19 0638 06/01/19 1624 06/02/19 0538  WBC 10.4  --   --  10.6*  HGB 8.3*   < > 8.3* 8.5*  HCT 25.8*   < > 25.9* 26.4*  PLT 329  --   --  342   < > = values in this interval not displayed.   BMET Recent Labs    06/01/19 0638 06/02/19 0538  NA 135 136  K 4.1 3.9  CL 104 104  CO2 23 22  GLUCOSE 175* 159*  BUN 18 16  CREATININE 0.99 0.94  CALCIUM 8.0* 8.0*   PT/INR Recent Labs    06/01/19 0638 06/02/19 0538  LABPROT 22.4* 20.6*  INR 2.1* 1.8*   CMP     Component Value Date/Time   NA 136 06/02/2019 0538   NA 137 01/10/2019 1228   K 3.9 06/02/2019 0538   CL 104 06/02/2019 0538   CO2 22 06/02/2019 0538   GLUCOSE 159 (H) 06/02/2019 0538   BUN 16 06/02/2019 0538   BUN 14 01/10/2019 1228   CREATININE 0.94 06/02/2019 0538   CALCIUM 8.0 (L) 06/02/2019 0538   PROT 5.7 (L) 06/01/2019 0638   PROT 6.6 01/10/2019 1228   ALBUMIN 1.8 (L) 06/01/2019 0638   ALBUMIN 3.7 (L) 01/10/2019 1228   AST 35 06/01/2019 0638   ALT 25 06/01/2019 0638   ALKPHOS 59 06/01/2019 0638   BILITOT 1.0 06/01/2019 0638   BILITOT 0.8 01/10/2019 1228   GFRNONAA >60 06/02/2019 0538   GFRAA >60 06/02/2019 0538   Lipase     Component Value Date/Time   LIPASE 71 (H) 05/29/2019 1428       Studies/Results: No results found.  Anti-infectives: Anti-infectives (From admission, onward)   Start     Dose/Rate Route Frequency Ordered Stop   05/31/19 1200  cefTRIAXone (ROCEPHIN) 2 g in sodium chloride 0.9 % 100 mL IVPB     2 g 200 mL/hr over 30 Minutes Intravenous Every 24 hours 05/31/19 1100         Assessment/Plan Hx TIA/CVA HTN HLD GERD COPD Hx of Aortic Aneurysm Repair and AVR on Coumadin - INR 1.8  Abdominal Pain, nausea Symptomatic Cholelithiasis with possible early cholecystitis  - Suspect this is the cause of the patients pain - Original plan for non-operative management with patients complex hemorrhagic pseudocyst of the pancreas that was noted on MRI. Repeat CT scan in  4-6 weeks and interval lap chole.  - Given patient symptomatic with cld and febrile overnight. Will obtain HIDA. Further recs to follow. Continue abx.  - AM CBC, CMP  Pancreatitic Pseudocyst - CT 4/25 with9.6 cm x 5.5 cm well-defined area of heterogeneous low attenuationat thetail of the pancreas. - MRI 4/26 w/8.8 x 5.3 cm heterogeneous masslike lesion in the pancreatic tail is most consistent with a complex hemorrhagic pseudocyst - CA 19-9 not elevated - GI following - Dr. Barry Dienes reviewed films with Dr. Ninfa Linden. They suspect this is not malignant and is either related to gallstone pancreatitis vs a benign cyst that bled.  FEN - NPO VTE -SCDs, INR 1.8, heparin Gtt ID -Rocephin. T 100.8. WBC 10.8  Plan: HIDA. Further recs to follow.    LOS: 2 days    Jillyn Ledger , [facility] Surgery 06/02/2019, 9:53 AM Please see Amion for pager number during  day hours 7:00am-4:30pm

## 2019-06-02 NOTE — Progress Notes (Signed)
Patient ID: Alexander Barton, male   DOB: 1950-03-28, 69 y.o.   MRN: [id number]  The HIDA scan was normal without evidence of cholecystitis. If his pain and fever persists, we should probably repeat a CT scan of the abdomen to look for changes in the pancreas and the pseudocyst.

## 2019-06-02 NOTE — Progress Notes (Signed)
Pt off the unit for HIDA scan.

## 2019-06-02 NOTE — Progress Notes (Signed)
Dr. Dewaine Oats paged due to pt's elevated HR of 102. Pt currently using nebulizer treatment. Will continue to monitor.

## 2019-06-02 NOTE — Progress Notes (Signed)
Pt having labored breathing since back on the floor. Pt is requesting home medications, Dr. Ree Kida notified. New orders for chest x-ray placed, and home medications resumed.

## 2019-06-02 NOTE — Plan of Care (Signed)
  Problem: Nutrition: Goal: Adequate nutrition will be maintained Outcome: Progressing   

## 2019-06-02 NOTE — Progress Notes (Signed)
ANTICOAGULATION CONSULT NOTE - Follow Up Consult  Pharmacy Consult for Heparin Indication: Coumadin on hold, Mechanical valve  Allergies  Allergen Reactions  . Codeine Anaphylaxis  . Gadolinium Derivatives Other (See Comments)    Other Reaction: intense sneezing MRI contrast  . Hydrocodone Anaphylaxis  . Iodinated Diagnostic Agents Other (See Comments)    Uncontrollable sneezing, needs sedative the night before and hour and benadryl before using Other Reaction: sneezing with SOB after prep  . Statins Other (See Comments)    Myalgias  . Hydromorphone Other (See Comments)  . Lipitor [Atorvastatin Calcium] Other (See Comments)    Myalgias   . Seroquel [Quetiapine Fumerate] Other (See Comments)    "bad trip" tongue swell    Patient Measurements: Height: 6\' 1"  (185.4 cm) Weight: 97.6 kg (215 lb 2.7 oz) IBW/kg (Calculated) : 79.9  Vital Signs: Temp: 97.8 F (36.6 C) (04/29 0310) Temp Source: Oral (04/29 0310) BP: 142/59 (04/29 0310) Pulse Rate: 79 (04/29 0310)  Labs: Recent Labs    05/31/19 0739 05/31/19 1430 06/01/19 [id number] 06/01/19 0638 06/01/19 1624 06/01/19 1830 06/02/19 0538  HGB 8.7*   < > 8.3*   < > 8.3*  --  8.5*  HCT 27.7*   < > 25.8*  --  25.9*  --  26.4*  PLT 340  --  329  --   --   --  342  LABPROT 25.4*  --  22.4*  --   --   --  20.6*  INR 2.3*  --  2.1*  --   --   --  1.8*  HEPARINUNFRC  --   --   --   --   --  <0.10* <0.10*  CREATININE 1.04  --  0.99  --   --   --  0.94   < > = values in this interval not displayed.    Estimated Creatinine Clearance: 90 mL/min (by C-G formula based on SCr of 0.94 mg/dL).  Assessment: 69 year old male on warfarin prior to admission for aortic valve.  Admitted with abdominal pain and hemorrhagic cyst of the pancreas with plans for conservative treatment for now.  INR down to 1.8. Goal INR on warfarin is 2 to 2.5 per last out patient anti-coag visit  Heparin level remains undetectable on gtt at 13.5 ml/hr. Hgb low  but stable. No issues with line or bleeding reported per RN.  Goal of Therapy:  Heparin level 0.3-0.7 units/ml Monitor platelets by anticoagulation protocol: Yes   Plan:  Increase heparin to 1650 units / hr  F/u 8 hr heparin level  Sherlon Handing, PharmD, BCPS Please see amion for complete clinical pharmacist phone list 06/02/2019 6:54 AM

## 2019-06-02 NOTE — Progress Notes (Signed)
Pt arrived back to the unit. Pt not in distress and tolerated well. Vital signs taken.

## 2019-06-02 NOTE — Progress Notes (Signed)
Daily Rounding Note  06/02/2019, 11:15 AM  LOS: 2 days   SUBJECTIVE:   Chief complaint: RUQ pain.    Pancreatic pseudocyst. Right upper quadrant pain persists, particularly when he moves around.  Still having loose, diarrheal stools.  Nausea but no vomiting. Currently down in nuclear medicine for HIDA scan, just left the floor within the last 5 minutes.  His wife and daughter as well as the nurse provided update on symptoms.  OBJECTIVE:         Vital signs in last 24 hours:    Temp:  [97.8 F (36.6 C)-100.9 F (38.3 C)] 98.7 F (37.1 C) (04/29 0825) Pulse Rate:  [79-90] 90 (04/29 0825) Resp:  [16-17] 16 (04/29 0825) BP: (111-142)/(59-64) 141/64 (04/29 0825) SpO2:  [88 %-98 %] 98 % (04/29 0825) Last BM Date: 06/02/19 Filed Weights   05/30/19 0626  Weight: 97.6 kg   Not reexamined as he is off the floor  Intake/Output from previous day: 04/28 0701 - 04/29 0700 In: 1384.7 [P.O.:1160; I.V.:224.7] Out: -   Intake/Output this shift: No intake/output data recorded.  Lab Results: Recent Labs    05/31/19 0739 05/31/19 1430 06/01/19 0638 06/01/19 1624 06/02/19 0538  WBC 11.6*  --  10.4  --  10.6*  HGB 8.7*   < > 8.3* 8.3* 8.5*  HCT 27.7*   < > 25.8* 25.9* 26.4*  PLT 340  --  329  --  342   < > = values in this interval not displayed.   BMET Recent Labs    05/31/19 0739 06/01/19 0638 06/02/19 0538  NA 136 135 136  K 4.3 4.1 3.9  CL 105 104 104  CO2 23 23 22   GLUCOSE 198* 175* 159*  BUN 19 18 16   CREATININE 1.04 0.99 0.94  CALCIUM 8.2* 8.0* 8.0*   LFT Recent Labs    05/31/19 0739 06/01/19 0638  PROT 6.1* 5.7*  ALBUMIN 2.1* 1.8*  AST 22 35  ALT 16 25  ALKPHOS 57 59  BILITOT 1.0 1.0  BILIDIR 0.3*  --   IBILI 0.7  --    PT/INR Recent Labs    06/01/19 0638 06/02/19 0538  LABPROT 22.4* 20.6*  INR 2.1* 1.8*   Hepatitis Panel No results for input(s): HEPBSAG, HCVAB, HEPAIGM, HEPBIGM in  the last 72 hours.  Studies/Results: No results found.  ASSESMENT:   *    Abdominal pain.  CT abdomen with tail of pancreas lesion.   MRI reveals hemorrhagic pseudocyst in tail of pancreas, small fluid collection region of anterior stomach wall likely small pseudocyst cholelithiasis, mild GB wall thickening, ducts normal.   ? No active bleding per imaging?.  Large pseudocyst noted on CT ang[facility] at [facility] in 03/2019.. Minor T bili elevation to 1.7, now normal and otherwise normal LFTs. Lipase max 71 CA 19-9 5, CEA 1.0.   Surgery suspect this is not a malignant mass and is either secondary to gallstone pancreatitis versus benign cyst that bled. ? Symptomatic cholelithiasis, early cholecystitis? Surgery ordered HIDA Continues on abx.   Vascular surgery does not feel there is any active bleeding or role for vascular intervention regarding the possible hemorrhagic component of the pseudocyst.  *    Chronic Coumadin owing to history of stroke and previous AVR. INR 4.1 >> 2.1.  Coumadin is on hold, heparin infusion initiated 11 this morning  *   Chronic aortic dissection.  Underwent total aortic arch replacement 12/02/2019, left carotid  subclavian bypass and thoracic endograft 11/5.  Within the last few months at [facility].  *    Anemia.  Normocytic. Hgb 10 >> 8.7 >> 8.3>> 8.5      PLAN   *  Await completion and reading of HIDA    Azucena Freed  06/02/2019, 11:15 AM Phone [phone]

## 2019-06-03 LAB — COMPREHENSIVE METABOLIC PANEL
ALT: 39 U/L (ref 0–44)
AST: 37 U/L (ref 15–41)
Albumin: 1.6 g/dL — ABNORMAL LOW (ref 3.5–5.0)
Alkaline Phosphatase: 75 U/L (ref 38–126)
Anion gap: 9 (ref 5–15)
BUN: 14 mg/dL (ref 8–23)
CO2: 22 mmol/L (ref 22–32)
Calcium: 7.7 mg/dL — ABNORMAL LOW (ref 8.9–10.3)
Chloride: 102 mmol/L (ref 98–111)
Creatinine, Ser: 0.95 mg/dL (ref 0.61–1.24)
GFR calc Af Amer: >60 mL/min (ref >60)
GFR calc non Af Amer: >60 mL/min (ref >60)
Glucose, Bld: 179 mg/dL — ABNORMAL HIGH (ref 70–99)
Potassium: 3.5 mmol/L (ref 3.5–5.1)
Sodium: 133 mmol/L — ABNORMAL LOW (ref 135–145)
Total Bilirubin: 1.2 mg/dL (ref 0.3–1.2)
Total Protein: 5.4 g/dL — ABNORMAL LOW (ref 6.5–8.1)

## 2019-06-03 LAB — CBC
HCT: 26.4 % — ABNORMAL LOW (ref 39.0–52.0)
Hemoglobin: 8.5 g/dL — ABNORMAL LOW (ref 13.0–17.0)
MCH: 28.1 pg (ref 26.0–34.0)
MCHC: 32.2 g/dL (ref 30.0–36.0)
MCV: 87.4 fL (ref 80.0–100.0)
Platelets: 373 10*3/uL (ref 150–400)
RBC: 3.02 MIL/uL — ABNORMAL LOW (ref 4.22–5.81)
RDW: 14.9 % (ref 11.5–15.5)
WBC: 14.9 10*3/uL — ABNORMAL HIGH (ref 4.0–10.5)
nRBC: 0.1 % (ref 0.0–0.2)

## 2019-06-03 LAB — GLUCOSE, CAPILLARY
Glucose-Capillary: 159 mg/dL — ABNORMAL HIGH (ref 70–99)
Glucose-Capillary: 241 mg/dL — ABNORMAL HIGH (ref 70–99)
Glucose-Capillary: 218 mg/dL — ABNORMAL HIGH (ref 70–99)
Glucose-Capillary: 196 mg/dL — ABNORMAL HIGH (ref 70–99)

## 2019-06-03 LAB — HEPARIN LEVEL (UNFRACTIONATED)
Heparin Unfractionated: 0.2 IU/mL — ABNORMAL LOW (ref 0.30–0.70)
Heparin Unfractionated: 0.3 IU/mL (ref 0.30–0.70)
Heparin Unfractionated: 0.37 IU/mL (ref 0.30–0.70)

## 2019-06-03 LAB — URINALYSIS, ROUTINE W REFLEX MICROSCOPIC
Bacteria, UA: NONE SEEN
Bilirubin Urine: NEGATIVE
Glucose, UA: 50 mg/dL — AB
Hgb urine dipstick: NEGATIVE
Ketones, ur: 5 mg/dL — AB
Leukocytes,Ua: NEGATIVE
Nitrite: NEGATIVE
Protein, ur: 30 mg/dL — AB
Specific Gravity, Urine: 1.026 (ref 1.005–1.030)
pH: 5 (ref 5.0–8.0)

## 2019-06-03 LAB — HEMOGLOBIN AND HEMATOCRIT, BLOOD
HCT: 24.6 % — ABNORMAL LOW (ref 39.0–52.0)
Hemoglobin: 8.1 g/dL — ABNORMAL LOW (ref 13.0–17.0)

## 2019-06-03 LAB — PROTIME-INR
INR: 1.8 — ABNORMAL HIGH (ref 0.8–1.2)
Prothrombin Time: 20.1 seconds — ABNORMAL HIGH (ref 11.4–15.2)

## 2019-06-03 LAB — LIPASE, BLOOD: Lipase: 38 U/L (ref 11–51)

## 2019-06-03 MED ORDER — DIPHENHYDRAMINE HCL 25 MG PO CAPS
50.0000 mg | ORAL_CAPSULE | Freq: Once | ORAL | Status: AC
  Administered 2019-06-03: 50 mg via ORAL
  Filled 2019-06-03: qty 2

## 2019-06-03 MED ORDER — DIPHENHYDRAMINE HCL 25 MG PO CAPS: 50.0000 mg | ORAL_CAPSULE | Freq: Once | ORAL | Status: DC

## 2019-06-03 MED ORDER — NONFORMULARY OR COMPOUNDED ITEM
1.0000 | Freq: Every day | Status: DC
  Administered 2019-06-04 – 2019-06-05 (×2): 1 via ORAL
  Filled 2019-06-03 (×5): qty 1

## 2019-06-03 MED ORDER — STUDY - INVESTIGATIONAL MEDICATION: 1.0000 | Freq: Every day | Status: DC

## 2019-06-03 MED ORDER — PREDNISONE 20 MG PO TABS: 50.0000 mg | ORAL_TABLET | Freq: Four times a day (QID) | ORAL | Status: DC

## 2019-06-03 MED ORDER — DIPHENHYDRAMINE HCL 50 MG/ML IJ SOLN: 50.0000 mg | Freq: Once | INTRAMUSCULAR | Status: DC

## 2019-06-03 MED ORDER — DIPHENHYDRAMINE HCL 50 MG/ML IJ SOLN: 50.0000 mg | Freq: Once | INTRAMUSCULAR | Status: AC

## 2019-06-03 MED ORDER — TAMSULOSIN HCL 0.4 MG PO CAPS
0.4000 mg | ORAL_CAPSULE | Freq: Two times a day (BID) | ORAL | Status: DC
  Administered 2019-06-03 – 2019-06-05 (×5): 0.4 mg via ORAL
  Filled 2019-06-03 (×5): qty 1

## 2019-06-03 MED ORDER — FUROSEMIDE 20 MG PO TABS: 10.0000 mg | ORAL_TABLET | Freq: Two times a day (BID) | ORAL | Status: DC | PRN

## 2019-06-03 MED ORDER — MOMETASONE FURO-FORMOTEROL FUM 200-5 MCG/ACT IN AERO
2.0000 | INHALATION_SPRAY | Freq: Two times a day (BID) | RESPIRATORY_TRACT | Status: DC
  Administered 2019-06-03 – 2019-06-05 (×4): 2 via RESPIRATORY_TRACT
  Filled 2019-06-03: qty 8.8

## 2019-06-03 MED ORDER — TRAMADOL HCL 50 MG PO TABS: 50.0000 mg | ORAL_TABLET | ORAL | Status: DC | PRN

## 2019-06-03 MED ORDER — STUDY - INVESTIGATIONAL MEDICATION
1.0000 | Freq: Every day | Status: DC
  Administered 2019-06-03 – 2019-06-05 (×3): 1 via ORAL
  Filled 2019-06-03 (×5): qty 1

## 2019-06-03 MED ORDER — PREDNISONE 20 MG PO TABS
50.0000 mg | ORAL_TABLET | Freq: Four times a day (QID) | ORAL | Status: AC
  Administered 2019-06-03 (×3): 50 mg via ORAL
  Filled 2019-06-03 (×3): qty 2

## 2019-06-03 MED ORDER — STUDY - INVESTIGATIONAL MEDICATION
1.0000 | Freq: Every day | Status: DC
  Filled 2019-06-03 (×2): qty 1

## 2019-06-03 NOTE — Progress Notes (Signed)
CT angio/abd and pelvis was orderd. Pre-med was started at 1200. CT tech notified, they will do the test at midnight.

## 2019-06-03 NOTE — Progress Notes (Addendum)
Subjective: CC: Abdominal pain Patient reports that his pain is improved the last 24 hours.  He notices if he sits still he does not have much pain.  His pain is primarily in the right upper quadrant of his abdomen.  He still has intermittent episodes of nausea but this is improved from the other day.  He is tolerating clear liquids.  Not passing any flatus but having bowel movements.  Objective: Vital signs in last 24 hours: Temp:  [98.7 F (37.1 C)-100.1 F (37.8 C)] 98.7 F (37.1 C) (04/29 2020) Pulse Rate:  [98-102] 98 (04/29 2043) Resp:  [17] 17 (04/29 1312) BP: (122-150)/(61-80) 122/61 (04/29 2020) SpO2:  [94 %-95 %] 95 % (04/30 0824) Last BM Date: 06/02/19  Intake/Output from previous day: 04/29 0701 - 04/30 0700 In: 480 [P.O.:480] Out: -  Intake/Output this shift: No intake/output data recorded.  PE: Gen:  Alert, NAD, pleasant Lungs: Normal rate and effort  Abd: ND,  generalized tenderness that is greatest in the RUQ but abdomen remains soft and is without rigidity. Some tenderness of the epigastrium. +BS Ext:  No LE edema  Psych: A&Ox3  Skin: no rashes noted, warm and dry  Lab Results:  Recent Labs    06/02/19 0538 06/02/19 0538 06/02/19 1650 06/03/19 0635  WBC 10.6*  --   --  14.9*  HGB 8.5*   < > 9.8* 8.5*  HCT 26.4*   < > 30.2* 26.4*  PLT 342  --   --  373   < > = values in this interval not displayed.   BMET Recent Labs    06/02/19 0538 06/03/19 0635  NA 136 133*  K 3.9 3.5  CL 104 102  CO2 22 22  GLUCOSE 159* 179*  BUN 16 14  CREATININE 0.94 0.95  CALCIUM 8.0* 7.7*   PT/INR Recent Labs    06/02/19 0538 06/03/19 0635  LABPROT 20.6* 20.1*  INR 1.8* 1.8*   CMP     Component Value Date/Time   NA 133 (L) 06/03/2019 0635   NA 137 01/10/2019 1228   K 3.5 06/03/2019 0635   CL 102 06/03/2019 0635   CO2 22 06/03/2019 0635   GLUCOSE 179 (H) 06/03/2019 0635   BUN 14 06/03/2019 0635   BUN 14 01/10/2019 1228   CREATININE 0.95  06/03/2019 0635   CALCIUM 7.7 (L) 06/03/2019 0635   PROT 5.4 (L) 06/03/2019 0635   PROT 6.6 01/10/2019 1228   ALBUMIN 1.6 (L) 06/03/2019 0635   ALBUMIN 3.7 (L) 01/10/2019 1228   AST 37 06/03/2019 0635   ALT 39 06/03/2019 0635   ALKPHOS 75 06/03/2019 0635   BILITOT 1.2 06/03/2019 0635   BILITOT 0.8 01/10/2019 1228   GFRNONAA >60 06/03/2019 0635   GFRAA >60 06/03/2019 0635   Lipase     Component Value Date/Time   LIPASE 38 06/03/2019 0635       Studies/Results: NM Hepatobiliary Liver Func  Result Date: 06/02/2019 CLINICAL DATA:  Evaluate for cholecystitis. EXAM: NUCLEAR MEDICINE HEPATOBILIARY IMAGING TECHNIQUE: Sequential images of the abdomen were obtained out to 60 minutes following intravenous administration of radiopharmaceutical. RADIOPHARMACEUTICALS:  5.4 mCi Tc-41m  Choletec IV COMPARISON:  Ultrasound and CT 05/29/2019, MRI abdomen 05/30/2019 FINDINGS: Prompt uptake and biliary excretion of activity by the liver is seen. Gallbladder activity is visualized at 25 minutes, consistent with patency of cystic duct. Biliary activity passes into small bowel at 10-15 minutes, consistent with patent common bile duct. IMPRESSION: Normal  HIDA scan without evidence of acute cholecystitis. Electronically Signed   By: Marin Olp M.D.   On: 06/02/2019 12:44   DG CHEST PORT 1 VIEW  Result Date: 06/02/2019 CLINICAL DATA:  Shortness of breath EXAM: PORTABLE CHEST 1 VIEW COMPARISON:  Chest radiograph and chest CT October 20, 2017 FINDINGS: There is bibasilar atelectasis. The lungs elsewhere are clear. Heart is upper normal in size with pulmonary vascularity normal. There is aneurysmal dilatation of the aorta with stent in the aorta extending from the mid ascending aorta to the distal descending aorta. Patient is status post coronary artery bypass grafting. Surgical clips in left axilla. No adenopathy. Surgical clips also noted in right axillary region. IMPRESSION: Stent placement within the aorta  for diffuse aneurysm. Bibasilar atelectasis. No edema or airspace opacity. Stable heart size. Electronically Signed   By: Lowella Grip III M.D.   On: 06/02/2019 14:57    Anti-infectives: Anti-infectives (From admission, onward)   Start     Dose/Rate Route Frequency Ordered Stop   05/31/19 1200  cefTRIAXone (ROCEPHIN) 2 g in sodium chloride 0.9 % 100 mL IVPB     2 g 200 mL/hr over 30 Minutes Intravenous Every 24 hours 05/31/19 1100         Assessment/Plan Hx TIA/CVA HTN HLD GERD COPD Hx of Aortic Aneurysm Repair and AVR on Coumadin - INR1.8  Abdominal Pain, nausea SymptomaticCholelithiasis  - HIDA negative.  No evidence of cholecystitis.  LFTs and Lipase wnl - Non-operative management with patientscomplex hemorrhagic pseudocystof the pancreas that was noted on MRI. Repeat CT scan in 4-6 weeks and interval lap chole.   PancreatiticPseudocyst - CT 4/25 with9.6 cm x 5.5 cm well-defined area of heterogeneous low attenuationat thetail of the pancreas. - MRI 4/26 w/8.8 x 5.3 cm heterogeneous masslike lesion in the pancreatic tail is most consistent with a complex hemorrhagic pseudocyst - CA 19-9not elevated - GI following -Dr. Barry Dienes reviewed films with Dr. Ninfa Linden. They suspect this is not malignant and is either related to gallstone pancreatitis vs a benign cyst that bled.  -Given negative HIDA and continued pain, with increasing white count and intermittent fevers- Will obtain CT scan.  Patient does have an allergy to IV contrast, will give steroids and Benadryl.   FEN - CLD VTE -SCDs, INR1.8, heparin Gtt ID -Rocephin 4/27 >>. T 100.1. WBC 14.9  Plan: CT scan. Further recs to follow.    LOS: 3 days    Alexander Barton , [facility] Surgery 06/03/2019, 9:25 AM Please see Amion for pager number during day hours 7:00am-4:30pm

## 2019-06-03 NOTE — Progress Notes (Addendum)
ANTICOAGULATION CONSULT NOTE - Follow Up Consult  Pharmacy Consult for Heparin Indication: Coumadin on Hold, Mechanical Valve  Allergies  Allergen Reactions  . Codeine Anaphylaxis  . Gadolinium Derivatives Other (See Comments)    Other Reaction: intense sneezing MRI contrast  . Hydrocodone Anaphylaxis  . Iodinated Diagnostic Agents Other (See Comments)    Uncontrollable sneezing, needs sedative the night before and hour and benadryl before using Other Reaction: sneezing with SOB after prep  . Statins Other (See Comments)    Myalgias  . Hydromorphone Other (See Comments)  . Lipitor [Atorvastatin Calcium] Other (See Comments)    Myalgias   . Seroquel [Quetiapine Fumerate] Other (See Comments)    "bad trip" tongue swell    Patient Measurements: Height: 6\' 1"  (185.4 cm) Weight: 97.6 kg (215 lb 2.7 oz) IBW/kg (Calculated) : 79.9  Heparin Dosing Weight: 97.6 kg  Vital Signs: Temp: 98.7 F (37.1 C) (04/30 1407) Temp Source: Oral (04/30 1407) BP: 105/48 (04/30 1407) Pulse Rate: 77 (04/30 1407)  Labs: Recent Labs    06/01/19 [id number] 06/01/19 1624 06/02/19 0538 06/02/19 1413 06/02/19 1650 06/03/19 0042 06/03/19 0635 06/03/19 0910 06/03/19 1516  HGB 8.3*   < > 8.5*  --  9.8*  --  8.5*  --   --   HCT 25.8*   < > 26.4*  --  30.2*  --  26.4*  --   --   PLT 329  --  342  --   --   --  373  --   --   LABPROT 22.4*  --  20.6*  --   --   --  20.1*  --   --   INR 2.1*  --  1.8*  --   --   --  1.8*  --   --   HEPARINUNFRC  --    < > <0.10*   < >  --  0.20*  --  0.30 0.37  CREATININE 0.99  --  0.94  --   --   --  0.95  --   --    < > = values in this interval not displayed.    Estimated Creatinine Clearance: 89 mL/min (by C-G formula based on SCr of 0.95 mg/dL).  Assessment: 69 year old male on warfarin PTA for aortic valve was admitted with abdominal pain and hemorrhagic cyst of the pancreas, with plans for conservative treatment for now. HIDA scan yesterday was normal, without  evidence of cholecystitis.  INR down to 1.8. Goal INR on warfarin is 2 to 2.5, per last outpatient anti-coagulation visit.  Heparin level earlier today (0.30 units/ml) on heparin infusion at 2150 units/hr was at low end of goal range. Heparin level this afternoon (~6 hrs after earlier level) was 0.37 units/ml, which is within the goal range for this pt. CBC stable. Per RN, no issues with IV or bleeding observed.  Goal of Therapy:  Heparin level: 0.3-0.7 units/ml (aiming for lower end of range, due to hemorrhagic cyst of pancreas) Monitor platelets by anticoagulation protocol: Yes   Plan:  Continue heparin at 2150 units/hr Monitor daily heparin level, CBC Monitor for signs/symptoms of bleeding  Gillermina Hu, PharmD, BCPS, [facility] Clinical Pharmacist 06/03/2019 3:56 PM

## 2019-06-03 NOTE — Progress Notes (Signed)
Daily Rounding Note  06/03/2019, 10:55 AM  LOS: 3 days   SUBJECTIVE:   Chief complaint: RUQ pain, pancreatic pseudocyst.    RUQ pain continues.  Felt cold and required extra blankets this morning.  No rigors, no sweats.  OBJECTIVE:         Vital signs in last 24 hours:    Temp:  [98.7 F (37.1 C)-100.1 F (37.8 C)] 98.7 F (37.1 C) (04/29 2020) Pulse Rate:  [98-102] 98 (04/29 2043) Resp:  [17] 17 (04/29 1312) BP: (122-150)/(61-80) 122/61 (04/29 2020) SpO2:  [94 %-95 %] 95 % (04/30 0824) Last BM Date: 06/02/19 Filed Weights   05/30/19 0626  Weight: 97.6 kg   General: Looks well, laying in lounge chair comfortable. Heart: Irregularly irregular, rate controlled. Chest: Clear bilaterally no labored breathing. Abdomen: Tender on right.  Active bowel sounds.  Soft. Extremities: No CCE. Neuro/Psych: Alert, appropriate.  Fully oriented.  No gross deficits.  Intake/Output from previous day: 04/29 0701 - 04/30 0700 In: 480 [P.O.:480] Out: -   Intake/Output this shift: No intake/output data recorded.  Lab Results: Recent Labs    06/01/19 0638 06/01/19 1624 06/02/19 0538 06/02/19 1650 06/03/19 0635  WBC 10.4  --  10.6*  --  14.9*  HGB 8.3*   < > 8.5* 9.8* 8.5*  HCT 25.8*   < > 26.4* 30.2* 26.4*  PLT 329  --  342  --  373   < > = values in this interval not displayed.   BMET Recent Labs    06/01/19 0638 06/02/19 0538 06/03/19 0635  NA 135 136 133*  K 4.1 3.9 3.5  CL 104 104 102  CO2 23 22 22   GLUCOSE 175* 159* 179*  BUN 18 16 14   CREATININE 0.99 0.94 0.95  CALCIUM 8.0* 8.0* 7.7*   LFT Recent Labs    06/01/19 0638 06/03/19 0635  PROT 5.7* 5.4*  ALBUMIN 1.8* 1.6*  AST 35 37  ALT 25 39  ALKPHOS 59 75  BILITOT 1.0 1.2   PT/INR Recent Labs    06/02/19 0538 06/03/19 0635  LABPROT 20.6* 20.1*  INR 1.8* 1.8*   Hepatitis Panel No results for input(s): HEPBSAG, HCVAB, HEPAIGM, HEPBIGM in  the last 72 hours.  Studies/Results: NM Hepatobiliary Liver Func  Result Date: 06/02/2019 CLINICAL DATA:  Evaluate for cholecystitis. EXAM: NUCLEAR MEDICINE HEPATOBILIARY IMAGING TECHNIQUE: Sequential images of the abdomen were obtained out to 60 minutes following intravenous administration of radiopharmaceutical. RADIOPHARMACEUTICALS:  5.4 mCi Tc-79m  Choletec IV COMPARISON:  Ultrasound and CT 05/29/2019, MRI abdomen 05/30/2019 FINDINGS: Prompt uptake and biliary excretion of activity by the liver is seen. Gallbladder activity is visualized at 25 minutes, consistent with patency of cystic duct. Biliary activity passes into small bowel at 10-15 minutes, consistent with patent common bile duct. IMPRESSION: Normal HIDA scan without evidence of acute cholecystitis. Electronically Signed   By: Marin Olp M.D.   On: 06/02/2019 12:44   DG CHEST PORT 1 VIEW  Result Date: 06/02/2019 CLINICAL DATA:  Shortness of breath EXAM: PORTABLE CHEST 1 VIEW COMPARISON:  Chest radiograph and chest CT October 20, 2017 FINDINGS: There is bibasilar atelectasis. The lungs elsewhere are clear. Heart is upper normal in size with pulmonary vascularity normal. There is aneurysmal dilatation of the aorta with stent in the aorta extending from the mid ascending aorta to the distal descending aorta. Patient is status post coronary artery bypass grafting. Surgical clips in left axilla. No  adenopathy. Surgical clips also noted in right axillary region. IMPRESSION: Stent placement within the aorta for diffuse aneurysm. Bibasilar atelectasis. No edema or airspace opacity. Stable heart size. Electronically Signed   By: Lowella Grip III M.D.   On: 06/02/2019 14:57    ASSESMENT:   *Abdominal pain. CT abdomen with tail of pancreas lesion.MRI reveals hemorrhagic pseudocyst in tail of pancreas, small fluid collection region of anterior stomach wall likely small pseudocyst cholelithiasis, mild GB wall thickening, ducts normal.   ? No active bleding per imaging?. Large pseudocyst noted on CT angio at [facility] in 03/2019.Marland Kitchen LFTs remain normal w resolved minir increase T bili.   WBC is rising from 10.6 >> 14.9 today. CA 19-9: 5, CEA 1.0. 06/02/2019 HIDA scan: Negative for cholecystitis.  Patent CBD. Vascular surgery does not feel there is any active bleeding or role for vascular intervention regarding the possible hemorrhagic component of the pseudocyst. CT angio ordered to further image pscyst.    *Chronic Coumadin owing to history of stroke and previous AVR. Coumadin on hold, heparin gtt in place.  INR 1.8  *   Chronic aortic dissection.  Underwent total aortic arch replacement 12/02/2019, left carotid subclavian bypass and thoracic endograft 11/5.  Within the last few months at [facility].  *Anemia. Normocytic.  *   Hyponatremia.  Day 4 Rocephin  *    Rising WBCs, 14.9 today.  No fever but has felt cold this morning.   PLAN   *  CTAP today.  NPO for this but resume diet of fulls after scan Pre-medicate w prednisone x 3 and benadryl x 1.    *   ?  Repeat work-up for elevated WBCs (cultures, UA, chest x-ray?).  Defer to Dr Ree Kida.      Azucena Freed  06/03/2019, 10:55 AM Phone [phone]

## 2019-06-03 NOTE — Progress Notes (Signed)
ANTICOAGULATION CONSULT NOTE - Follow Up Consult  Pharmacy Consult for Heparin Indication: Coumadin on Hold, Mechanical Valve  Allergies  Allergen Reactions  . Codeine Anaphylaxis  . Gadolinium Derivatives Other (See Comments)    Other Reaction: intense sneezing MRI contrast  . Hydrocodone Anaphylaxis  . Iodinated Diagnostic Agents Other (See Comments)    Uncontrollable sneezing, needs sedative the night before and hour and benadryl before using Other Reaction: sneezing with SOB after prep  . Statins Other (See Comments)    Myalgias  . Hydromorphone Other (See Comments)  . Lipitor [Atorvastatin Calcium] Other (See Comments)    Myalgias   . Seroquel [Quetiapine Fumerate] Other (See Comments)    "bad trip" tongue swell    Patient Measurements: Height: 6\' 1"  (185.4 cm) Weight: 97.6 kg (215 lb 2.7 oz) IBW/kg (Calculated) : 79.9  Heparin Dosing Weight: 97.6 kg  Vital Signs: Temp: 98.7 F (37.1 C) (04/29 2020) Temp Source: Oral (04/29 2020) BP: 122/61 (04/29 2020) Pulse Rate: 98 (04/29 2043)  Labs: Recent Labs    05/31/19 0739 05/31/19 1430 06/01/19 [id number] 06/01/19 [id number] 06/01/19 1624 06/01/19 1830 06/02/19 0538 06/02/19 1413 06/02/19 1650 06/03/19 0042  HGB 8.7*   < > 8.3*   < > 8.3*   < > 8.5*  --  9.8*  --   HCT 27.7*   < > 25.8*   < > 25.9*  --  26.4*  --  30.2*  --   PLT 340  --  329  --   --   --  342  --   --   --   LABPROT 25.4*  --  22.4*  --   --   --  20.6*  --   --   --   INR 2.3*  --  2.1*  --   --   --  1.8*  --   --   --   HEPARINUNFRC  --   --   --   --   --    < > <0.10* 0.10*  --  0.20*  CREATININE 1.04  --  0.99  --   --   --  0.94  --   --   --    < > = values in this interval not displayed.    Estimated Creatinine Clearance: 90 mL/min (by C-G formula based on SCr of 0.94 mg/dL).  Assessment: 69 year old male on warfarin PTA for aortic valve was admitted with abdominal pain and hemorrhagic cyst of the pancreas, with plans for conservative  treatment for now. HIDA scan today was normal, without evidence of cholecystitis.  INR down to 1.8. Goal INR on warfarin is 2 to 2.5, per last outpatient anti-coagulation visit.  Heparin level remains subtherapeutic (0.2) on gtt at 1950 units/hr. No issues with line or bleeding reported per RN.  Goal of Therapy:  Heparin level: 0.3-0.7 units/ml (aiming for lower end of range, 0.3-0.5 units/ml, due to hemorrhagic cyst of pancreas) Monitor platelets by anticoagulation protocol: Yes   Plan:  Increase heparin to 2150 units/hr Check 8-hr heparin level  Sherlon Handing, PharmD, BCPS Please see amion for complete clinical pharmacist phone list 06/03/2019 1:03 AM

## 2019-06-03 NOTE — Progress Notes (Signed)
PROGRESS NOTE    Alexander Barton  [id number] DOB: December 05, 1950 DOA: 05/29/2019 PCP: Leone Haven, MD   Brief Narrative:  HPI On 05/30/2019 by Dr. Bunnie Pion Alexander Barton is a 69 y.o. male with medical history significant of hypertension, hyperlipidemia, aortic valve replacement, stroke and COPD presented to ED for evaluation of worsening nominal pain with nausea and vomiting.  Patient states that the pain is ongoing for more than 2 weeks but continue to worsen since last week.  Pain is located mainly in epigastric region but become generalized and upper abdomen and is related with severe nausea and vomiting patient further mentioned that he is unable to eat and drink for the last few days because of worsening nausea and vomiting.  Patient further mentioned that he had a CT scan last week and it showed a new pancreatic mass.  Patient is scheduled for gastroenterology appointment on Tuesday.  Patient otherwise denies fever, chills, chest pain, shortness of breath and urinary symptoms.    Interim history Admitted for abdominal pain that is intractable, likely secondary to gallstones, pancreatitis with pseudocyst.  General surgery and gastroenterology consulted and appreciated.  No plans for laparoscopic cholecystectomy at this time.  Patient started on IV ceftriaxone empirically. Assessment & Plan   Intractable abdominal pain -Likely secondary to gallstones causing pancreatitis and pancreatic pseudocyst with intracystic hemorrhage -General surgery as well as gastroenterology consulted and appreciated -Continue IV pain control and liquids -Patient placed on Rocephin empirically -states he cannot tolerate any medications with codeine and that tramadol has helped him in the past -Continues to have abdominal pain, worse with movement  -GI and surgery planning for CT abd/pelvis to further evaluate pancreatic pseudocyst  Complex hemorrhagic cyst of the pancreas -Noted on MRI -As  above GI and surgery on board -Continue conservative treatment -Gastroenterology would like vascular surgery to weigh in -Patient currently on Coumadin for mechanical aortic valve.  Coumadin is currently on hold  Cholelithiasis with gallbladder sludge -CT scan and ultrasound performed, MRI abdomen shows mild gallbladder wall thickening and inflammation -Conservative management for now with possibility of surgery in 4 to 6 weeks -General surgery ordered HIDA scan which is normal, no acute cholecystitis   Supratherapeutic INR -INR was up to 4.1, currently down to 1.8 -Coumadin currently on hold  History of mechanical aortic valve replacement/stroke -As above, Coumadin currently held, now on heparin drip  COPD -Currently compensated, no wheezing noted on exam -Continue home medications of Spiriva, Dulera (substituted for Symbicort) -Nebulizer treatments as needed  Hyperglycemia/New onset diabetes mellitus, type II -Patient denies history of diabetes -A1c in 2018 was 6.1, repeat A1c on 05/30/2019 was 7.3 -Placed on insulin sliding scale for now -Patient will likely benefit from home oral hypoglycemics on discharge  Chronic normocytic anemia -hemoglobin stable- currently 8.5, continue to monitor CBC  Chronic abdominal aortic dissection  -Vascular surgery consulted and appreciated, no invention needed at this time. Paitent follows with Cardiothoracic surgeon at [facility].   SIRS -Patient with intermittent fevers and elevated leukocytosis today, up to 14 -Currently he is already on antibiotics, ceftriaxone -Chest x-ray obtained and unremarkable for infection -Patient with no urinary complaints -will obtain blood cultures -Continue to monitor closely  Hyperlipidemia -Patient currently enrolled in a trial and on desiccated medication -Wife will bring in the medication, will then sent to pharmacy for administration  DVT Prophylaxis  Heparin  Code Status: Full   Family Communication:  None at bedside  Disposition Plan:  Status is: Inpatient  Remains inpatient appropriate because:Ongoing diagnostic testing needed not appropriate for outpatient work up as needed for IV antibiotics and pain control.   Dispo: The patient is from: Home              Anticipated d/c is to: Home              Anticipated d/c date is: 2 days              Patient currently is not medically stable to d/c.  Consultants General Surgery Gastroenterology  Procedures  HIDA  Antibiotics   Anti-infectives (From admission, onward)   Start     Dose/Rate Route Frequency Ordered Stop   05/31/19 1200  cefTRIAXone (ROCEPHIN) 2 g in sodium chloride 0.9 % 100 mL IVPB     2 g 200 mL/hr over 30 Minutes Intravenous Every 24 hours 05/31/19 1100        Subjective:   Alexander Barton seen and examined today.  Patient concerned that he has not been on his home medication since being admitted.  We reviewed all of his home medications and now he understands why some of his as needed medications have not been restarted.  He did try to have a bowel movement this morning.  He currently denies nausea or vomiting.  Continues to have a full sensation.  Does have abdominal pain which is worse with movement.  Denies current chest pain or shortness of breath, dizziness or headache.   Objective:   Vitals:   06/02/19 1312 06/02/19 2020 06/02/19 2043 06/03/19 0824  BP: (!) 150/80 122/61    Pulse: 98 (!) 102 98   Resp: 17     Temp: 100.1 F (37.8 C) 98.7 F (37.1 C)    TempSrc: Oral Oral    SpO2: 94% 94%  95%  Weight:      Height:        Intake/Output Summary (Last 24 hours) at 06/03/2019 1239 Last data filed at 06/02/2019 1900 Gross per 24 hour  Intake 480 ml  Output --  Net 480 ml   Filed Weights   05/30/19 0626  Weight: 97.6 kg   Exam  General: Well developed, well nourished, NAD, appears stated age  HEENT: NCAT,  mucous membranes moist.   Cardiovascular: S1 S2 auscultated, IRR,  +click  Respiratory: Clear to auscultation bilaterally with equal chest rise  Abdomen: Soft, right sided TTP, nondistended, + bowel sounds  Extremities: warm dry without cyanosis clubbing or edema  Neuro: AAOx3, nonfocal  Psych: Appropriate mood and affect   Data Reviewed: I have personally reviewed following labs and imaging studies  CBC: Recent Labs  Lab 05/30/19 0410 05/30/19 0410 05/31/19 0739 05/31/19 1430 06/01/19 0638 06/01/19 1624 06/02/19 0538 06/02/19 1650 06/03/19 0635  WBC 13.0*  --  11.6*  --  10.4  --  10.6*  --  14.9*  HGB 8.8*   < > 8.7*   < > 8.3* 8.3* 8.5* 9.8* 8.5*  HCT 27.6*   < > 27.7*   < > 25.8* 25.9* 26.4* 30.2* 26.4*  MCV 88.5  --  89.6  --  89.6  --  88.0  --  87.4  PLT 311  --  340  --  329  --  342  --  373   < > = values in this interval not displayed.   Basic Metabolic Panel: Recent Labs  Lab 05/30/19 0410 05/31/19 0739 06/01/19 0638 06/02/19 0538 06/03/19 0635  NA 134* 136 135 136 133*  K 4.5 4.3 4.1 3.9 3.5  CL 102 105 104 104 102  CO2 22 23 23 22 22   GLUCOSE 245* 198* 175* 159* 179*  BUN 18 19 18 16 14   CREATININE 1.14 1.04 0.99 0.94 0.95  CALCIUM 8.2* 8.2* 8.0* 8.0* 7.7*  MG  --   --  2.1  --   --    GFR: Estimated Creatinine Clearance: 89 mL/min (by C-G formula based on SCr of 0.95 mg/dL). Liver Function Tests: Recent Labs  Lab 05/29/19 1428 05/30/19 0410 05/31/19 0739 06/01/19 0638 06/03/19 0635  AST 19 14* 22 35 37  ALT 14 14 16 25  39  ALKPHOS 71 57 57 59 75  BILITOT 1.7* 1.1 1.0 1.0 1.2  PROT 7.5 6.4* 6.1* 5.7* 5.4*  ALBUMIN 3.0* 2.4* 2.1* 1.8* 1.6*   Recent Labs  Lab 05/29/19 1428 06/03/19 0635  LIPASE 71* 38   No results for input(s): AMMONIA in the last 168 hours. Coagulation Profile: Recent Labs  Lab 05/30/19 0119 05/31/19 0739 06/01/19 [id number] 06/02/19 0538 06/03/19 0635  INR 4.1* 2.3* 2.1* 1.8* 1.8*   Cardiac Enzymes: No results for input(s): CKTOTAL, CKMB, CKMBINDEX, TROPONINI in the last  168 hours. BNP (last 3 results) No results for input(s): PROBNP in the last 8760 hours. HbA1C: No results for input(s): HGBA1C in the last 72 hours. CBG: Recent Labs  Lab 06/02/19 1313 06/02/19 1649 06/02/19 2245 06/03/19 0757 06/03/19 1131  GLUCAP 134* 169* 171* 159* 241*   Lipid Profile: No results for input(s): CHOL, HDL, LDLCALC, TRIG, CHOLHDL, LDLDIRECT in the last 72 hours. Thyroid Function Tests: No results for input(s): TSH, T4TOTAL, FREET4, T3FREE, THYROIDAB in the last 72 hours. Anemia Panel: No results for input(s): VITAMINB12, FOLATE, FERRITIN, TIBC, IRON, RETICCTPCT in the last 72 hours. Urine analysis:    Component Value Date/Time   COLORURINE YELLOW 05/29/2019 2120   APPEARANCEUR HAZY (A) 05/29/2019 2120   LABSPEC >1.046 (H) 05/29/2019 2120   PHURINE 5.0 05/29/2019 2120   GLUCOSEU 50 (A) 05/29/2019 2120   HGBUR NEGATIVE 05/29/2019 2120   BILIRUBINUR NEGATIVE 05/29/2019 2120   BILIRUBINUR moderate 01/11/2019 1302   KETONESUR 5 (A) 05/29/2019 2120   PROTEINUR 100 (A) 05/29/2019 2120   UROBILINOGEN 4.0 (A) 01/11/2019 1302   UROBILINOGEN 0.2 06/04/2012 0027   NITRITE NEGATIVE 05/29/2019 2120   LEUKOCYTESUR NEGATIVE 05/29/2019 2120   Sepsis Labs: @LABRCNTIP (procalcitonin:4,lacticidven:4)  ) Recent Results (from the past 240 hour(s))  SARS CORONAVIRUS 2 (TAT 6-24 HRS) Nasopharyngeal Nasopharyngeal Swab     Status: None   Collection Time: 05/30/19 12:25 AM   Specimen: Nasopharyngeal Swab  Result Value Ref Range Status   SARS Coronavirus 2 NEGATIVE NEGATIVE Final    Comment: (NOTE) SARS-CoV-2 target nucleic acids are NOT DETECTED. The SARS-CoV-2 RNA is generally detectable in upper and lower respiratory specimens during the acute phase of infection. Negative results do not preclude SARS-CoV-2 infection, do not rule out co-infections with other pathogens, and should not be used as the sole basis for treatment or other patient management  decisions. Negative results must be combined with clinical observations, patient history, and epidemiological information. The expected result is Negative. Fact Sheet for Patients: SugarRoll.be Fact Sheet for Healthcare Providers: https://www.woods-mathews.com/ This test is not yet approved or cleared by the [place] FDA and  has been authorized for detection and/or diagnosis of SARS-CoV-2 by FDA under an Emergency Use Authorization (EUA). This EUA will remain  in effect (meaning this test can be used) for the duration of  the COVID-19 declaration under Section 56 4(b)(1) of the Act, 21 U.S.C. section 360bbb-3(b)(1), unless the authorization is terminated or revoked sooner. Performed at [facility], [place] [address] [place],  [address]       Radiology Studies: NM Hepatobiliary Liver Func  Result Date: 06/02/2019 CLINICAL DATA:  Evaluate for cholecystitis. EXAM: NUCLEAR MEDICINE HEPATOBILIARY IMAGING TECHNIQUE: Sequential images of the abdomen were obtained out to 60 minutes following intravenous administration of radiopharmaceutical. RADIOPHARMACEUTICALS:  5.4 mCi Tc-44m  Choletec IV COMPARISON:  Ultrasound and CT 05/29/2019, MRI abdomen 05/30/2019 FINDINGS: Prompt uptake and biliary excretion of activity by the liver is seen. Gallbladder activity is visualized at 25 minutes, consistent with patency of cystic duct. Biliary activity passes into small bowel at 10-15 minutes, consistent with patent common bile duct. IMPRESSION: Normal HIDA scan without evidence of acute cholecystitis. Electronically Signed   By: Marin Olp M.D.   On: 06/02/2019 12:44   DG CHEST PORT 1 VIEW  Result Date: 06/02/2019 CLINICAL DATA:  Shortness of breath EXAM: PORTABLE CHEST 1 VIEW COMPARISON:  Chest radiograph and chest CT October 20, 2017 FINDINGS: There is bibasilar atelectasis. The lungs elsewhere are clear. Heart is upper normal in size with  pulmonary vascularity normal. There is aneurysmal dilatation of the aorta with stent in the aorta extending from the mid ascending aorta to the distal descending aorta. Patient is status post coronary artery bypass grafting. Surgical clips in left axilla. No adenopathy. Surgical clips also noted in right axillary region. IMPRESSION: Stent placement within the aorta for diffuse aneurysm. Bibasilar atelectasis. No edema or airspace opacity. Stable heart size. Electronically Signed   By: Lowella Grip III M.D.   On: 06/02/2019 14:57     Scheduled Meds: . [START ON 06/04/2019] diphenhydrAMINE  50 mg Oral Once   Or  . [START ON 06/04/2019] diphenhydrAMINE  50 mg Intravenous Once  . feeding supplement (ENSURE ENLIVE)  237 mL Oral TID BM  . insulin aspart  0-5 Units Subcutaneous QHS  . insulin aspart  0-9 Units Subcutaneous TID WC  . Investigational - Study Medication  1 tablet Oral Daily  . mometasone-formoterol  2 puff Inhalation BID  . NONFORMULARY OR COMPOUNDED ITEM 1 capsule  1 capsule Oral Daily  . predniSONE  50 mg Oral Q6H  . sodium chloride flush  3 mL Intravenous Once  . tamsulosin  0.4 mg Oral BID  . umeclidinium bromide  1 puff Inhalation Daily   Continuous Infusions: . sodium chloride 100 mL/hr at 06/01/19 0835  . cefTRIAXone (ROCEPHIN)  IV 2 g (06/03/19 1148)  . heparin 2,150 Units/hr (06/03/19 0917)     LOS: 3 days   Time Spent in minutes   45 minutes  Kyheem Bathgate D.O. on 06/03/2019 at 12:39 PM  Between 7am to 7pm - Please see pager noted on [email or web address]  After 7pm go to [email or web address]  And look for the night coverage person covering for me after hours  [facility] Office  [phone]

## 2019-06-03 NOTE — Progress Notes (Signed)
ANTICOAGULATION CONSULT NOTE - Follow Up Consult  Pharmacy Consult for Heparin Indication: Coumadin on Hold, Mechanical Valve  Allergies  Allergen Reactions  . Codeine Anaphylaxis  . Gadolinium Derivatives Other (See Comments)    Other Reaction: intense sneezing MRI contrast  . Hydrocodone Anaphylaxis  . Iodinated Diagnostic Agents Other (See Comments)    Uncontrollable sneezing, needs sedative the night before and hour and benadryl before using Other Reaction: sneezing with SOB after prep  . Statins Other (See Comments)    Myalgias  . Hydromorphone Other (See Comments)  . Lipitor [Atorvastatin Calcium] Other (See Comments)    Myalgias   . Seroquel [Quetiapine Fumerate] Other (See Comments)    "bad trip" tongue swell    Patient Measurements: Height: 6\' 1"  (185.4 cm) Weight: 97.6 kg (215 lb 2.7 oz) IBW/kg (Calculated) : 79.9  Heparin Dosing Weight: 97.6 kg  Vital Signs:    Labs: Recent Labs    06/01/19 0638 06/01/19 1624 06/02/19 0538 06/02/19 0538 06/02/19 1413 06/02/19 1650 06/03/19 0042 06/03/19 0635 06/03/19 0910  HGB 8.3*   < > 8.5*   < >  --  9.8*  --  8.5*  --   HCT 25.8*   < > 26.4*  --   --  30.2*  --  26.4*  --   PLT 329  --  342  --   --   --   --  373  --   LABPROT 22.4*  --  20.6*  --   --   --   --  20.1*  --   INR 2.1*  --  1.8*  --   --   --   --  1.8*  --   HEPARINUNFRC  --    < > <0.10*   < > 0.10*  --  0.20*  --  0.30  CREATININE 0.99  --  0.94  --   --   --   --  0.95  --    < > = values in this interval not displayed.    Estimated Creatinine Clearance: 89 mL/min (by C-G formula based on SCr of 0.95 mg/dL).  Assessment: 69 year old male on warfarin PTA for aortic valve was admitted with abdominal pain and hemorrhagic cyst of the pancreas, with plans for conservative treatment for now. HIDA scan today was normal, without evidence of cholecystitis.  Yesterday's INR down to 1.8. Goal INR on warfarin is 2 to 2.5, per last outpatient  anti-coagulation visit.  Heparin was increased to 2150 units/hr following subtherapeutic heparin level (0.2) earlier today.  This morning's heparin level is therapeutic with heparin infusing at 2150 units/hr. No issues with line or bleeding reported per RN.  Goal of Therapy:  Heparin level: 0.3-0.7 units/ml (aiming for lower end of range, 0.3-0.5 units/ml, due to hemorrhagic cyst of pancreas) Monitor platelets by anticoagulation protocol: Yes   Plan:  Continue heparin at 2150 units/hr Monitor daily heparin level, CBC, s/s bleeding  Shela Commons, PharmD, BCPS Please see [email or web address] for complete clinical pharmacist phone list 06/03/2019 10:16 AM

## 2019-06-04 ENCOUNTER — Inpatient Hospital Stay (HOSPITAL_COMMUNITY): Payer: Medicare PPO

## 2019-06-04 LAB — BASIC METABOLIC PANEL
Anion gap: 10 (ref 5–15)
BUN: 15 mg/dL (ref 8–23)
CO2: 21 mmol/L — ABNORMAL LOW (ref 22–32)
Calcium: 8.1 mg/dL — ABNORMAL LOW (ref 8.9–10.3)
Chloride: 104 mmol/L (ref 98–111)
Creatinine, Ser: 0.87 mg/dL (ref 0.61–1.24)
GFR calc Af Amer: >60 mL/min (ref >60)
GFR calc non Af Amer: >60 mL/min (ref >60)
Glucose, Bld: 274 mg/dL — ABNORMAL HIGH (ref 70–99)
Potassium: 3.6 mmol/L (ref 3.5–5.1)
Sodium: 135 mmol/L (ref 135–145)

## 2019-06-04 LAB — CBC
HCT: 24.7 % — ABNORMAL LOW (ref 39.0–52.0)
Hemoglobin: 7.9 g/dL — ABNORMAL LOW (ref 13.0–17.0)
MCH: 27.9 pg (ref 26.0–34.0)
MCHC: 32 g/dL (ref 30.0–36.0)
MCV: 87.3 fL (ref 80.0–100.0)
Platelets: 356 10*3/uL (ref 150–400)
RBC: 2.83 MIL/uL — ABNORMAL LOW (ref 4.22–5.81)
RDW: 14.6 % (ref 11.5–15.5)
WBC: 15.2 10*3/uL — ABNORMAL HIGH (ref 4.0–10.5)
nRBC: 0 % (ref 0.0–0.2)

## 2019-06-04 LAB — GLUCOSE, CAPILLARY
Glucose-Capillary: 236 mg/dL — ABNORMAL HIGH (ref 70–99)
Glucose-Capillary: 343 mg/dL — ABNORMAL HIGH (ref 70–99)
Glucose-Capillary: 354 mg/dL — ABNORMAL HIGH (ref 70–99)
Glucose-Capillary: 281 mg/dL — ABNORMAL HIGH (ref 70–99)

## 2019-06-04 LAB — URINE CULTURE: Culture: NO GROWTH

## 2019-06-04 LAB — PROTIME-INR
INR: 1.7 — ABNORMAL HIGH (ref 0.8–1.2)
Prothrombin Time: 19 seconds — ABNORMAL HIGH (ref 11.4–15.2)

## 2019-06-04 LAB — HEPARIN LEVEL (UNFRACTIONATED): Heparin Unfractionated: 0.46 IU/mL (ref 0.30–0.70)

## 2019-06-04 MED ORDER — WARFARIN - PHARMACIST DOSING INPATIENT: Freq: Every day | Status: DC

## 2019-06-04 MED ORDER — WARFARIN SODIUM 5 MG PO TABS
5.0000 mg | ORAL_TABLET | Freq: Once | ORAL | Status: AC
  Administered 2019-06-04: 5 mg via ORAL
  Filled 2019-06-04: qty 1

## 2019-06-04 MED ORDER — IOHEXOL 350 MG/ML SOLN
100.0000 mL | Freq: Once | INTRAVENOUS | Status: AC | PRN
  Administered 2019-06-04: 100 mL via INTRAVENOUS

## 2019-06-04 NOTE — Progress Notes (Addendum)
[place] for Heparin> warfarin Indication: Mechanical Valve  Allergies  Allergen Reactions  . Codeine Anaphylaxis  . Gadolinium Derivatives Other (See Comments)    Other Reaction: intense sneezing MRI contrast  . Hydrocodone Anaphylaxis  . Iodinated Diagnostic Agents Other (See Comments)    Uncontrollable sneezing, needs sedative the night before and hour and benadryl before using Other Reaction: sneezing with SOB after prep  . Statins Other (See Comments)    Myalgias  . Hydromorphone Other (See Comments)  . Lipitor [Atorvastatin Calcium] Other (See Comments)    Myalgias   . Seroquel [Quetiapine Fumerate] Other (See Comments)    "bad trip" tongue swell    Patient Measurements: Height: 6\' 1"  (185.4 cm) Weight: 97.6 kg (215 lb 2.7 oz) IBW/kg (Calculated) : 79.9  Heparin Dosing Weight: 97.6 kg  Vital Signs: Temp: 97.7 F (36.5 C) (05/01 0405) Temp Source: Oral (05/01 0405) BP: 128/79 (05/01 0405) Pulse Rate: 71 (05/01 0405)  Labs: Recent Labs    06/02/19 0538 06/02/19 1413 06/03/19 0042 06/03/19 [id number] 06/03/19 0635 06/03/19 0910 06/03/19 1516 06/03/19 1716 06/04/19 0515  HGB 8.5*   < >  --  8.5*   < >  --   --  8.1* 7.9*  HCT 26.4*   < >  --  26.4*  --   --   --  24.6* 24.7*  PLT 342  --   --  373  --   --   --   --  356  LABPROT 20.6*  --   --  20.1*  --   --   --   --  19.0*  INR 1.8*  --   --  1.8*  --   --   --   --  1.7*  HEPARINUNFRC <0.10*   < >   < >  --   --  0.30 0.37  --  0.46  CREATININE 0.94  --   --  0.95  --   --   --   --  0.87   < > = values in this interval not displayed.    Estimated Creatinine Clearance: 97.2 mL/min (by C-G formula based on SCr of 0.87 mg/dL).  Assessment: 69 year old male on warfarin PTA for mechanical aortic valve replacement (Goal INR on warfarin is 2 to 2.5, per last outpatient anticoagulation visit; PTA dose 7mg  daily except 5mg  on Tuesdays; last PTA dose 4/24) was admitted with  abdominal pain and hemorrhagic cyst of the pancreas, with plans for conservative treatment for now. Pharmacy consulted for IV heparin while warfarin is being held.  Today, heparin level remains therapeutic range at 0.46. Hgb low but relatively stable at 7.9 and platelets WNL. No issues with the infusion or overt bleeding noted per RN. INR trending down to 1.7 as anticipated since doses held.   Goal of Therapy:  Heparin level: 0.3-0.7 units/ml (aiming for lower end of range, due to hemorrhagic cyst of pancreas) Monitor platelets by anticoagulation protocol: Yes   Plan:  Continue heparin at 2150 units/hr Monitor CBC, daily heparin level  Daily PT/INR in anticipation of transition to back to warfarin Continue to monitor for signs/symptoms of bleeding  **Addendum: Pharmacy has now been consulted to restart warfarin. Will restart warfarin at 5mg  x 1 tonight and continue heparin infusion overlap for 5 days and until INR >2 for 48 hours**   Brendolyn Patty, PharmD PGY2 Pharmacy Resident Phone [phone]

## 2019-06-04 NOTE — Progress Notes (Signed)
PROGRESS NOTE    Alexander Barton  [id number] DOB: 1950-03-23 DOA: 05/29/2019 PCP: Leone Haven, MD   Brief Narrative:  HPI On 05/30/2019 by Dr. Bunnie Pion Alexander Barton is a 69 y.o. male with medical history significant of hypertension, hyperlipidemia, aortic valve replacement, stroke and COPD presented to ED for evaluation of worsening nominal pain with nausea and vomiting.  Patient states that the pain is ongoing for more than 2 weeks but continue to worsen since last week.  Pain is located mainly in epigastric region but become generalized and upper abdomen and is related with severe nausea and vomiting patient further mentioned that he is unable to eat and drink for the last few days because of worsening nausea and vomiting.  Patient further mentioned that he had a CT scan last week and it showed a new pancreatic mass.  Patient is scheduled for gastroenterology appointment on Tuesday.  Patient otherwise denies fever, chills, chest pain, shortness of breath and urinary symptoms.    Interim history Admitted for abdominal pain that is intractable, likely secondary to gallstones, pancreatitis with pseudocyst.  General surgery and gastroenterology consulted and appreciated.  No plans for laparoscopic cholecystectomy at this time.  Patient started on IV ceftriaxone empirically. Assessment & Plan   Intractable abdominal pain -Likely secondary to gallstones causing pancreatitis and pancreatic pseudocyst with intracystic hemorrhage -General surgery as well as gastroenterology consulted and appreciated -Continue IV pain control and liquids -Patient placed on Rocephin empirically -states he cannot tolerate any medications with codeine and that tramadol has helped him in the past -Abdominal appears better today  -will advance diet to soft -CTA abdomen pelvis showed slightly smaller complex high density pseudocyst.  Maturing and enlarging adjacent cirrhosis with roughly 9 cm  maturing pseudocyst between the stomach and the left lobe of the liver, adjacent smaller collection abutting the liver and gallbladder.  Complex hemorrhagic cyst of the pancreas -Noted on MRI -As above GI and surgery on board -Continue conservative treatment -Gastroenterology would like vascular surgery to weigh in -Patient currently on Coumadin for mechanical aortic valve.  Coumadin is currently on hold  Cholelithiasis with gallbladder sludge -CT scan and ultrasound performed, MRI abdomen shows mild gallbladder wall thickening and inflammation -Conservative management for now with possibility of surgery in 4 to 6 weeks -General surgery ordered HIDA scan which is normal, no acute cholecystitis   Supratherapeutic INR -INR was up to 4.1, currently down to 1.8 -Coumadin currently on hold- but will restart  History of mechanical aortic valve replacement/stroke -As above, Coumadin currently held, now on heparin drip -given no surgery, and no bleeding noted on CT scan, will restart coumadin  COPD -Currently compensated, no wheezing noted on exam -Continue home medications of Spiriva, Dulera (substituted for Symbicort) -Nebulizer treatments as needed  Hyperglycemia/New onset diabetes mellitus, type II -Patient denies history of diabetes -A1c in 2018 was 6.1, repeat A1c on 05/30/2019 was 7.3 -Placed on insulin sliding scale for now -Patient will likely benefit from home oral hypoglycemics on discharge  Chronic normocytic anemia -hemoglobin stable- currently 8.5, continue to monitor CBC  Chronic abdominal aortic dissection  -Vascular surgery consulted and appreciated, no invention needed at this time. Paitent follows with Cardiothoracic surgeon at [facility].   SIRS -Patient with intermittent fevers and elevated leukocytosis today, up to 15.2- no fever overnight but did complain of sweating -Currently he is already on antibiotics, ceftriaxone -Chest x-ray obtained and unremarkable for  infection -Patient with no urinary complaints -UA unmarkable for infection -  Blood cultures show no growth to date -CT A/P showed no evidence of infection -?secondary to above process -Continue to monitor closely  Hyperlipidemia -Patient currently enrolled in a trial and on desiccated medication -Wife brought in the medication, sent to pharmacy for administration  DVT Prophylaxis  Heparin  Code Status: Full   Family Communication: None at bedside  Disposition Plan:  Status is: Inpatient  Remains inpatient appropriate because:Ongoing diagnostic testing needed not appropriate for outpatient work up as needed for IV antibiotics and pain control.   Dispo: The patient is from: Home              Anticipated d/c is to: Home              Anticipated d/c date is: 2 days              Patient currently is not medically stable to d/c.  Consultants General Surgery Gastroenterology  Procedures  HIDA  Antibiotics   Anti-infectives (From admission, onward)   Start     Dose/Rate Route Frequency Ordered Stop   05/31/19 1200  cefTRIAXone (ROCEPHIN) 2 g in sodium chloride 0.9 % 100 mL IVPB     2 g 200 mL/hr over 30 Minutes Intravenous Every 24 hours 05/31/19 1100        Subjective:   Alexander Barton seen and examined today.  Patient states he does not have any further abdominal pain, nausea.  Would like to try to eat actual food.  Would like to get up and walk around.  He does complain of night sweats twice last night.  Denies current chest pain or shortness of breath, headache or dizziness.  Hoping to go home soon.  Objective:   Vitals:   06/03/19 2108 06/04/19 0405 06/04/19 0846 06/04/19 0847  BP:  128/79    Pulse:  71    Resp:  16    Temp:  97.7 F (36.5 C)    TempSrc:  Oral    SpO2: 97% 97% 94% 94%  Weight:      Height:        Intake/Output Summary (Last 24 hours) at 06/04/2019 1208 Last data filed at 06/04/2019 0900 Gross per 24 hour  Intake 240 ml  Output --  Net  240 ml   Filed Weights   05/30/19 0626  Weight: 97.6 kg   Exam  General: Well developed, well nourished, NAD, appears stated age  66: NCAT, mucous membranes moist.   Cardiovascular: S1 S2 auscultated, IRR, click  Respiratory: Clear to auscultation bilaterally   Abdomen: Soft, nontender, nondistended, + bowel sounds  Extremities: warm dry without cyanosis clubbing or edema  Neuro: AAOx3, nonfocal  Psych: Pleasant, appropriate mood and affect  Data Reviewed: I have personally reviewed following labs and imaging studies  CBC: Recent Labs  Lab 05/31/19 0739 05/31/19 1430 06/01/19 0638 06/01/19 1624 06/02/19 0538 06/02/19 1650 06/03/19 0635 06/03/19 1716 06/04/19 0515  WBC 11.6*  --  10.4  --  10.6*  --  14.9*  --  15.2*  HGB 8.7*   < > 8.3*   < > 8.5* 9.8* 8.5* 8.1* 7.9*  HCT 27.7*   < > 25.8*   < > 26.4* 30.2* 26.4* 24.6* 24.7*  MCV 89.6  --  89.6  --  88.0  --  87.4  --  87.3  PLT 340  --  329  --  342  --  373  --  356   < > = values in this  interval not displayed.   Basic Metabolic Panel: Recent Labs  Lab 05/31/19 0739 06/01/19 0638 06/02/19 0538 06/03/19 0635 06/04/19 0515  NA 136 135 136 133* 135  K 4.3 4.1 3.9 3.5 3.6  CL 105 104 104 102 104  CO2 23 23 22 22  21*  GLUCOSE 198* 175* 159* 179* 274*  BUN 19 18 16 14 15   CREATININE 1.04 0.99 0.94 0.95 0.87  CALCIUM 8.2* 8.0* 8.0* 7.7* 8.1*  MG  --  2.1  --   --   --    GFR: Estimated Creatinine Clearance: 97.2 mL/min (by C-G formula based on SCr of 0.87 mg/dL). Liver Function Tests: Recent Labs  Lab 05/29/19 1428 05/30/19 0410 05/31/19 0739 06/01/19 0638 06/03/19 0635  AST 19 14* 22 35 37  ALT 14 14 16 25  39  ALKPHOS 71 57 57 59 75  BILITOT 1.7* 1.1 1.0 1.0 1.2  PROT 7.5 6.4* 6.1* 5.7* 5.4*  ALBUMIN 3.0* 2.4* 2.1* 1.8* 1.6*   Recent Labs  Lab 05/29/19 1428 06/03/19 0635  LIPASE 71* 38   No results for input(s): AMMONIA in the last 168 hours. Coagulation Profile: Recent Labs   Lab 05/31/19 0739 06/01/19 [id number] 06/02/19 0538 06/03/19 0635 06/04/19 0515  INR 2.3* 2.1* 1.8* 1.8* 1.7*   Cardiac Enzymes: No results for input(s): CKTOTAL, CKMB, CKMBINDEX, TROPONINI in the last 168 hours. BNP (last 3 results) No results for input(s): PROBNP in the last 8760 hours. HbA1C: No results for input(s): HGBA1C in the last 72 hours. CBG: Recent Labs  Lab 06/03/19 0757 06/03/19 1131 06/03/19 1612 06/03/19 2051 06/04/19 0757  GLUCAP 159* 241* 218* 196* 236*   Lipid Profile: No results for input(s): CHOL, HDL, LDLCALC, TRIG, CHOLHDL, LDLDIRECT in the last 72 hours. Thyroid Function Tests: No results for input(s): TSH, T4TOTAL, FREET4, T3FREE, THYROIDAB in the last 72 hours. Anemia Panel: No results for input(s): VITAMINB12, FOLATE, FERRITIN, TIBC, IRON, RETICCTPCT in the last 72 hours. Urine analysis:    Component Value Date/Time   COLORURINE AMBER (A) 06/03/2019 1730   APPEARANCEUR HAZY (A) 06/03/2019 1730   LABSPEC 1.026 06/03/2019 1730   PHURINE 5.0 06/03/2019 1730   GLUCOSEU 50 (A) 06/03/2019 1730   HGBUR NEGATIVE 06/03/2019 1730   BILIRUBINUR NEGATIVE 06/03/2019 1730   BILIRUBINUR moderate 01/11/2019 1302   KETONESUR 5 (A) 06/03/2019 1730   PROTEINUR 30 (A) 06/03/2019 1730   UROBILINOGEN 4.0 (A) 01/11/2019 1302   UROBILINOGEN 0.2 06/04/2012 0027   NITRITE NEGATIVE 06/03/2019 1730   LEUKOCYTESUR NEGATIVE 06/03/2019 1730   Sepsis Labs: @LABRCNTIP (procalcitonin:4,lacticidven:4)  ) Recent Results (from the past 240 hour(s))  SARS CORONAVIRUS 2 (TAT 6-24 HRS) Nasopharyngeal Nasopharyngeal Swab     Status: None   Collection Time: 05/30/19 12:25 AM   Specimen: Nasopharyngeal Swab  Result Value Ref Range Status   SARS Coronavirus 2 NEGATIVE NEGATIVE Final    Comment: (NOTE) SARS-CoV-2 target nucleic acids are NOT DETECTED. The SARS-CoV-2 RNA is generally detectable in upper and lower respiratory specimens during the acute phase of infection.  Negative results do not preclude SARS-CoV-2 infection, do not rule out co-infections with other pathogens, and should not be used as the sole basis for treatment or other patient management decisions. Negative results must be combined with clinical observations, patient history, and epidemiological information. The expected result is Negative. Fact Sheet for Patients: SugarRoll.be Fact Sheet for Healthcare Providers: https://www.woods-mathews.com/ This test is not yet approved or cleared by the [place] FDA and  has been authorized for  detection and/or diagnosis of SARS-CoV-2 by FDA under an Emergency Use Authorization (EUA). This EUA will remain  in effect (meaning this test can be used) for the duration of the COVID-19 declaration under Section 56 4(b)(1) of the Act, 21 U.S.C. section 360bbb-3(b)(1), unless the authorization is terminated or revoked sooner. Performed at [facility], [place] [address], [place], [place] [address]   Culture, blood (routine x 2)     Status: None (Preliminary result)   Collection Time: 06/03/19  2:12 PM   Specimen: BLOOD  Result Value Ref Range Status   Specimen Description BLOOD RIGHT ANTECUBITAL  Final   Special Requests   Final    BOTTLES DRAWN AEROBIC AND ANAEROBIC Blood Culture adequate volume   Culture   Final    NO GROWTH < 24 HOURS Performed at [facility], [place] [address] [place], [place] [address]    Report Status PENDING  Incomplete  Culture, blood (routine x 2)     Status: None (Preliminary result)   Collection Time: 06/03/19  2:18 PM   Specimen: BLOOD RIGHT HAND  Result Value Ref Range Status   Specimen Description BLOOD RIGHT HAND  Final   Special Requests   Final    BOTTLES DRAWN AEROBIC AND ANAEROBIC Blood Culture adequate volume   Culture   Final    NO GROWTH < 24 HOURS Performed at [facility], [place] [address], [place], [place] [address]    Report Status PENDING   Incomplete      Radiology Studies: NM Hepatobiliary Liver Func  Result Date: 06/02/2019 CLINICAL DATA:  Evaluate for cholecystitis. EXAM: NUCLEAR MEDICINE HEPATOBILIARY IMAGING TECHNIQUE: Sequential images of the abdomen were obtained out to 60 minutes following intravenous administration of radiopharmaceutical. RADIOPHARMACEUTICALS:  5.4 mCi Tc-79m  Choletec IV COMPARISON:  Ultrasound and CT 05/29/2019, MRI abdomen 05/30/2019 FINDINGS: Prompt uptake and biliary excretion of activity by the liver is seen. Gallbladder activity is visualized at 25 minutes, consistent with patency of cystic duct. Biliary activity passes into small bowel at 10-15 minutes, consistent with patent common bile duct. IMPRESSION: Normal HIDA scan without evidence of acute cholecystitis. Electronically Signed   By: Marin Olp M.D.   On: 06/02/2019 12:44   DG CHEST PORT 1 VIEW  Result Date: 06/02/2019 CLINICAL DATA:  Shortness of breath EXAM: PORTABLE CHEST 1 VIEW COMPARISON:  Chest radiograph and chest CT October 20, 2017 FINDINGS: There is bibasilar atelectasis. The lungs elsewhere are clear. Heart is upper normal in size with pulmonary vascularity normal. There is aneurysmal dilatation of the aorta with stent in the aorta extending from the mid ascending aorta to the distal descending aorta. Patient is status post coronary artery bypass grafting. Surgical clips in left axilla. No adenopathy. Surgical clips also noted in right axillary region. IMPRESSION: Stent placement within the aorta for diffuse aneurysm. Bibasilar atelectasis. No edema or airspace opacity. Stable heart size. Electronically Signed   By: Lowella Grip III M.D.   On: 06/02/2019 14:57   CT Angio Abd/Pel w/ and/or w/o  Result Date: 06/04/2019 CLINICAL DATA:  Pancreatitis and pancreatic pseudocyst with evidence internal hemorrhage of a dominant pseudocyst at the level of the tail of the pancreas by prior imaging. EXAM: CT ANGIOGRAPHY ABDOMEN AND PELVIS  WITH CONTRAST TECHNIQUE: Multidetector CT imaging of the abdomen and pelvis was performed using the standard protocol during bolus administration of intravenous contrast. Multiplanar reconstructed images and MIPs were obtained and reviewed to evaluate the vascular anatomy. CONTRAST:  150mL OMNIPAQUE IOHEXOL 350 MG/ML SOLN COMPARISON:  MRI of the abdomen on 05/30/2019 and CT of the abdomen on 05/29/2019 FINDINGS: VASCULAR Aorta: Evidence of prior aortic dissection with visualization of the lower and of an endograft within the true lumen of the distal descending thoracic aorta. This endograft extends to the diaphragmatic hiatus. Maximum diameter of the proximal abdominal aorta is stable and measures 3.2 x 3.4 cm. Dissection extends into the left common iliac artery and terminates before the common iliac bifurcation. Celiac: Celiac axis extends off of the false lumen and is patent. SMA: Dissection partially extends into the origin of the SMA and the SMA is patent with most of the flow appearing to emanate off of the false lumen. Renals: Patent right renal artery extends off the true lumen and patent left renal artery appears to emanate off of the posterior aspect of the aorta partially off of the false lumen and potentially with some contribution from the true lumen. IMA: Patent IMA emanates off of the true lumen. Inflow: Bilateral common, external and internal iliac arteries demonstrate normal patency. Proximal Outflow: Normally patent bilateral common femoral arteries and femoral bifurcations. Veins: The splenic vein is patent at the level of the splenic hilum. As it is followed, it is compressed by the pseudocyst centered at the tail of the pancreas. Central portion of the splenic vein appears patent. Other venous structures appear normally patent including the portal vein, visualized mesenteric veins, IVC, bilateral renal veins, iliac veins and common femoral veins. Review of the MIP images confirms the above  findings. NON-VASCULAR Lower chest: Bibasilar atelectasis and tiny bilateral pleural effusions. Hepatobiliary: No hepatic lesions or biliary dilatation. Stable gallstones. Pancreas: Head and body of the pancreas demonstrate no necrosis, mass or ductal dilatation. Spleen: Normal in size without focal abnormality. Adrenals/Urinary Tract: Adrenal glands are unremarkable. Kidneys are normal, without renal calculi, focal lesion, or hydronephrosis. Bladder is unremarkable. Stomach/Bowel: Bowel shows no evidence of obstruction, ileus, inflammation, lesion or free air. Lymphatic: No enlarged lymph nodes identified. Reproductive: Prostate is unremarkable. Other: High-density pseudocyst centered around the tail of the pancreas shows stable to slightly smaller size and measures approximately 5.3 x 9.5 cm transversely. Internal density is again consistent with complex fluid and stable between arterial and venous studies with no evidence of contrast extravasation to suggest active bleeding or focal arterial pseudoaneurysm. Enlarging and maturing pseudocyst is identified medial to the lesser curvature of the stomach and invaginating the capsule of the left lobe of the liver measuring approximately 8.9 x 8.6 cm in transverse dimensions. This region of fluid measured approximately 5.5 cm in diameter on the prior study. This fluid collection does not appear hemorrhagic or infected. Adjacent and slightly more inferior collection abutting the liver and gallbladder measures approximately 3.5 x 6.7 cm and previously measured approximately 5 cm in greatest diameter. This is also consistent with evolving pseudocyst and has identical density to the larger adjacent pseudocyst without evidence of hemorrhage or infection. There is some free fluid in the pericolic gutters and pelvis. Musculoskeletal: No acute or significant osseous findings. IMPRESSION: 1. Stable to slightly smaller complex high density pseudocyst centered at the level of the  tail of the pancreas. CTA demonstrates no evidence to suggest active bleeding within this pseudocyst or associated arterial pseudoaneurysm. The pseudocyst does compress the splenic vein without obvious splenic vein thrombosis. 2. Maturing and enlarging adjacent pseudocysts with roughly 9 cm maturing pseudocyst between the stomach and left lobe of the liver and adjacent smaller collection abutting  the liver and gallbladder. Both of the pseudocysts demonstrate lower internal fluid density and demonstrate no evidence to suggest internal hemorrhage or infection by CT. Electronically Signed   By: Aletta Edouard M.D.   On: 06/04/2019 09:47     Scheduled Meds: . feeding supplement (ENSURE ENLIVE)  237 mL Oral TID BM  . insulin aspart  0-5 Units Subcutaneous QHS  . insulin aspart  0-9 Units Subcutaneous TID WC  . Investigational - Study Medication  1 tablet Oral Daily  . mometasone-formoterol  2 puff Inhalation BID  . NONFORMULARY OR COMPOUNDED ITEM 1 capsule  1 capsule Oral Daily  . sodium chloride flush  3 mL Intravenous Once  . tamsulosin  0.4 mg Oral BID  . umeclidinium bromide  1 puff Inhalation Daily   Continuous Infusions: . sodium chloride 100 mL/hr at 06/03/19 1720  . cefTRIAXone (ROCEPHIN)  IV 2 g (06/04/19 1111)  . heparin 2,150 Units/hr (06/04/19 1102)     LOS: 4 days   Time Spent in minutes   45 minutes  Shayanna Thatch D.O. on 06/04/2019 at 12:08 PM  Between 7am to 7pm - Please see pager noted on [email or web address]  After 7pm go to [email or web address]  And look for the night coverage person covering for me after hours  [facility] Office  [phone]

## 2019-06-04 NOTE — Progress Notes (Signed)
Subjective/Chief Complaint: Feels great, having bowel function, eating, no ab pain, cta this am with nothing concerning   Objective: Vital signs in last 24 hours: Temp:  [97.7 F (36.5 C)-98.7 F (37.1 C)] 97.7 F (36.5 C) (05/01 0405) Pulse Rate:  [71-78] 71 (05/01 0405) Resp:  [14-18] 16 (05/01 0405) BP: (105-128)/(48-79) 128/79 (05/01 0405) SpO2:  [94 %-98 %] 94 % (05/01 0847) Last BM Date: 06/02/19  Intake/Output from previous day: No intake/output data recorded. Intake/Output this shift: Total I/O In: 240 [P.O.:240] Out: -   GI: soft nt/nd  Lab Results:  Recent Labs    06/03/19 0635 06/03/19 0635 06/03/19 1716 06/04/19 0515  WBC 14.9*  --   --  15.2*  HGB 8.5*   < > 8.1* 7.9*  HCT 26.4*   < > 24.6* 24.7*  PLT 373  --   --  356   < > = values in this interval not displayed.   BMET Recent Labs    06/03/19 0635 06/04/19 0515  NA 133* 135  K 3.5 3.6  CL 102 104  CO2 22 21*  GLUCOSE 179* 274*  BUN 14 15  CREATININE 0.95 0.87  CALCIUM 7.7* 8.1*   PT/INR Recent Labs    06/03/19 0635 06/04/19 0515  LABPROT 20.1* 19.0*  INR 1.8* 1.7*   ABG No results for input(s): PHART, HCO3 in the last 72 hours.  Invalid input(s): PCO2, PO2  Studies/Results: NM Hepatobiliary Liver Func  Result Date: 06/02/2019 CLINICAL DATA:  Evaluate for cholecystitis. EXAM: NUCLEAR MEDICINE HEPATOBILIARY IMAGING TECHNIQUE: Sequential images of the abdomen were obtained out to 60 minutes following intravenous administration of radiopharmaceutical. RADIOPHARMACEUTICALS:  5.4 mCi Tc-45m  Choletec IV COMPARISON:  Ultrasound and CT 05/29/2019, MRI abdomen 05/30/2019 FINDINGS: Prompt uptake and biliary excretion of activity by the liver is seen. Gallbladder activity is visualized at 25 minutes, consistent with patency of cystic duct. Biliary activity passes into small bowel at 10-15 minutes, consistent with patent common bile duct. IMPRESSION: Normal HIDA scan without evidence of  acute cholecystitis. Electronically Signed   By: Marin Olp M.D.   On: 06/02/2019 12:44   DG CHEST PORT 1 VIEW  Result Date: 06/02/2019 CLINICAL DATA:  Shortness of breath EXAM: PORTABLE CHEST 1 VIEW COMPARISON:  Chest radiograph and chest CT October 20, 2017 FINDINGS: There is bibasilar atelectasis. The lungs elsewhere are clear. Heart is upper normal in size with pulmonary vascularity normal. There is aneurysmal dilatation of the aorta with stent in the aorta extending from the mid ascending aorta to the distal descending aorta. Patient is status post coronary artery bypass grafting. Surgical clips in left axilla. No adenopathy. Surgical clips also noted in right axillary region. IMPRESSION: Stent placement within the aorta for diffuse aneurysm. Bibasilar atelectasis. No edema or airspace opacity. Stable heart size. Electronically Signed   By: Lowella Grip III M.D.   On: 06/02/2019 14:57   CT Angio Abd/Pel w/ and/or w/o  Result Date: 06/04/2019 CLINICAL DATA:  Pancreatitis and pancreatic pseudocyst with evidence internal hemorrhage of a dominant pseudocyst at the level of the tail of the pancreas by prior imaging. EXAM: CT ANGIOGRAPHY ABDOMEN AND PELVIS WITH CONTRAST TECHNIQUE: Multidetector CT imaging of the abdomen and pelvis was performed using the standard protocol during bolus administration of intravenous contrast. Multiplanar reconstructed images and MIPs were obtained and reviewed to evaluate the vascular anatomy. CONTRAST:  115mL OMNIPAQUE IOHEXOL 350 MG/ML SOLN COMPARISON:  MRI of the abdomen on 05/30/2019 and CT of the  abdomen on 05/29/2019 FINDINGS: VASCULAR Aorta: Evidence of prior aortic dissection with visualization of the lower and of an endograft within the true lumen of the distal descending thoracic aorta. This endograft extends to the diaphragmatic hiatus. Maximum diameter of the proximal abdominal aorta is stable and measures 3.2 x 3.4 cm. Dissection extends into the left  common iliac artery and terminates before the common iliac bifurcation. Celiac: Celiac axis extends off of the false lumen and is patent. SMA: Dissection partially extends into the origin of the SMA and the SMA is patent with most of the flow appearing to emanate off of the false lumen. Renals: Patent right renal artery extends off the true lumen and patent left renal artery appears to emanate off of the posterior aspect of the aorta partially off of the false lumen and potentially with some contribution from the true lumen. IMA: Patent IMA emanates off of the true lumen. Inflow: Bilateral common, external and internal iliac arteries demonstrate normal patency. Proximal Outflow: Normally patent bilateral common femoral arteries and femoral bifurcations. Veins: The splenic vein is patent at the level of the splenic hilum. As it is followed, it is compressed by the pseudocyst centered at the tail of the pancreas. Central portion of the splenic vein appears patent. Other venous structures appear normally patent including the portal vein, visualized mesenteric veins, IVC, bilateral renal veins, iliac veins and common femoral veins. Review of the MIP images confirms the above findings. NON-VASCULAR Lower chest: Bibasilar atelectasis and tiny bilateral pleural effusions. Hepatobiliary: No hepatic lesions or biliary dilatation. Stable gallstones. Pancreas: Head and body of the pancreas demonstrate no necrosis, mass or ductal dilatation. Spleen: Normal in size without focal abnormality. Adrenals/Urinary Tract: Adrenal glands are unremarkable. Kidneys are normal, without renal calculi, focal lesion, or hydronephrosis. Bladder is unremarkable. Stomach/Bowel: Bowel shows no evidence of obstruction, ileus, inflammation, lesion or free air. Lymphatic: No enlarged lymph nodes identified. Reproductive: Prostate is unremarkable. Other: High-density pseudocyst centered around the tail of the pancreas shows stable to slightly smaller  size and measures approximately 5.3 x 9.5 cm transversely. Internal density is again consistent with complex fluid and stable between arterial and venous studies with no evidence of contrast extravasation to suggest active bleeding or focal arterial pseudoaneurysm. Enlarging and maturing pseudocyst is identified medial to the lesser curvature of the stomach and invaginating the capsule of the left lobe of the liver measuring approximately 8.9 x 8.6 cm in transverse dimensions. This region of fluid measured approximately 5.5 cm in diameter on the prior study. This fluid collection does not appear hemorrhagic or infected. Adjacent and slightly more inferior collection abutting the liver and gallbladder measures approximately 3.5 x 6.7 cm and previously measured approximately 5 cm in greatest diameter. This is also consistent with evolving pseudocyst and has identical density to the larger adjacent pseudocyst without evidence of hemorrhage or infection. There is some free fluid in the pericolic gutters and pelvis. Musculoskeletal: No acute or significant osseous findings. IMPRESSION: 1. Stable to slightly smaller complex high density pseudocyst centered at the level of the tail of the pancreas. CTA demonstrates no evidence to suggest active bleeding within this pseudocyst or associated arterial pseudoaneurysm. The pseudocyst does compress the splenic vein without obvious splenic vein thrombosis. 2. Maturing and enlarging adjacent pseudocysts with roughly 9 cm maturing pseudocyst between the stomach and left lobe of the liver and adjacent smaller collection abutting the liver and gallbladder. Both of the pseudocysts demonstrate lower internal fluid density and demonstrate no evidence to  suggest internal hemorrhage or infection by CT. Electronically Signed   By: Aletta Edouard M.D.   On: 06/04/2019 09:47    Anti-infectives: Anti-infectives (From admission, onward)   Start     Dose/Rate Route Frequency Ordered Stop    05/31/19 1200  cefTRIAXone (ROCEPHIN) 2 g in sodium chloride 0.9 % 100 mL IVPB     2 g 200 mL/hr over 30 Minutes Intravenous Every 24 hours 05/31/19 1100        Assessment/Plan: Hx TIA/CVA HTN HLD GERD COPD Hx of Aortic Aneurysm Repair and AVR on Coumadin - INR1.8  Abdominal Pain, nausea SymptomaticCholelithiasis - HIDA negative.  No evidence of cholecystitis.  LFTs and Lipase wnl -Non-operative management with patientscomplex hemorrhagic pseudocystof the pancreas that was noted on MRI.Repeat CT scan in 4-6 weeks and interval lap chole.   PancreaticPseudocyst - CT 4/25 with9.6 cm x 5.5 cm well-defined area of heterogeneous low attenuationat thetail of the pancreas. - MRI 4/26 w/8.8 x 5.3 cm heterogeneous masslike lesion in the pancreatic tail is most consistent with a complex hemorrhagic pseudocyst - CA 19-9not elevated - GI following -Given negative HIDA and continued pain, with increasing white count and intermittent fevers- Will obtain CT scan.  Patient does have an allergy to IV contrast, will give steroids and Benadryl. ct with nothing concerning today FEN -soft diet VTE -SCDs, heparin Gtt ID -Rocephin 4/27 >>. Wbc 15.2 He is clinically well and I would be fine with dc today and follow up Will sign off Rolm [employer] 06/04/2019

## 2019-06-04 NOTE — Progress Notes (Signed)
[facility] ROUNDING NOTE   Subjective: No acute events overnight.  CTA completed overnight.  Read was still pending at the time of rounds this morning.  Tolerating breakfast this morning with increased appetite.  States that he feels much better.  Did have night sweats without fevers overnight.  Requesting to ambulate halls.   Objective: Vital signs in last 24 hours: Temp:  [97.7 F (36.5 C)-98.7 F (37.1 C)] 97.7 F (36.5 C) (05/01 0405) Pulse Rate:  [71-78] 71 (05/01 0405) Resp:  [14-18] 16 (05/01 0405) BP: (105-128)/(48-79) 128/79 (05/01 0405) SpO2:  [94 %-98 %] 94 % (05/01 0847) Last BM Date: 06/02/19 General: NAD Lungs:  CTA b/l, no w/r/r Heart:  RRR, no m/r/g Abdomen:  Soft, minimal TTP in RUQ > MEG, without rebound or guarding, no peritoneal signs.+BS Ext:  No c/c/e    Intake/Output from previous day: No intake/output data recorded. Intake/Output this shift: No intake/output data recorded.   Lab Results: Recent Labs    06/02/19 0538 06/02/19 1650 06/03/19 0635 06/03/19 1716 06/04/19 0515  WBC 10.6*  --  14.9*  --  15.2*  HGB 8.5*   < > 8.5* 8.1* 7.9*  PLT 342  --  373  --  356  MCV 88.0  --  87.4  --  87.3   < > = values in this interval not displayed.   BMET Recent Labs    06/02/19 0538 06/03/19 0635 06/04/19 0515  NA 136 133* 135  K 3.9 3.5 3.6  CL 104 102 104  CO2 22 22 21*  GLUCOSE 159* 179* 274*  BUN 16 14 15   CREATININE 0.94 0.95 0.87  CALCIUM 8.0* 7.7* 8.1*   LFT Recent Labs    06/03/19 0635  PROT 5.4*  ALBUMIN 1.6*  AST 37  ALT 39  ALKPHOS 75  BILITOT 1.2   PT/INR Recent Labs    06/03/19 0635 06/04/19 0515  INR 1.8* 1.7*      Imaging/Other results: NM Hepatobiliary Liver Func  Result Date: 06/02/2019 CLINICAL DATA:  Evaluate for cholecystitis. EXAM: NUCLEAR MEDICINE HEPATOBILIARY IMAGING TECHNIQUE: Sequential images of the abdomen were obtained out to 60 minutes following intravenous administration of  radiopharmaceutical. RADIOPHARMACEUTICALS:  5.4 mCi Tc-59m  Choletec IV COMPARISON:  Ultrasound and CT 05/29/2019, MRI abdomen 05/30/2019 FINDINGS: Prompt uptake and biliary excretion of activity by the liver is seen. Gallbladder activity is visualized at 25 minutes, consistent with patency of cystic duct. Biliary activity passes into small bowel at 10-15 minutes, consistent with patent common bile duct. IMPRESSION: Normal HIDA scan without evidence of acute cholecystitis. Electronically Signed   By: Marin Olp M.D.   On: 06/02/2019 12:44   DG CHEST PORT 1 VIEW  Result Date: 06/02/2019 CLINICAL DATA:  Shortness of breath EXAM: PORTABLE CHEST 1 VIEW COMPARISON:  Chest radiograph and chest CT October 20, 2017 FINDINGS: There is bibasilar atelectasis. The lungs elsewhere are clear. Heart is upper normal in size with pulmonary vascularity normal. There is aneurysmal dilatation of the aorta with stent in the aorta extending from the mid ascending aorta to the distal descending aorta. Patient is status post coronary artery bypass grafting. Surgical clips in left axilla. No adenopathy. Surgical clips also noted in right axillary region. IMPRESSION: Stent placement within the aorta for diffuse aneurysm. Bibasilar atelectasis. No edema or airspace opacity. Stable heart size. Electronically Signed   By: Lowella Grip III M.D.   On: 06/02/2019 14:57   CT Angio Abd/Pel w/ and/or w/o  Result Date:  06/04/2019 CLINICAL DATA:  Pancreatitis and pancreatic pseudocyst with evidence internal hemorrhage of a dominant pseudocyst at the level of the tail of the pancreas by prior imaging. EXAM: CT ANGIOGRAPHY ABDOMEN AND PELVIS WITH CONTRAST TECHNIQUE: Multidetector CT imaging of the abdomen and pelvis was performed using the standard protocol during bolus administration of intravenous contrast. Multiplanar reconstructed images and MIPs were obtained and reviewed to evaluate the vascular anatomy. CONTRAST:  124mL OMNIPAQUE  IOHEXOL 350 MG/ML SOLN COMPARISON:  MRI of the abdomen on 05/30/2019 and CT of the abdomen on 05/29/2019 FINDINGS: VASCULAR Aorta: Evidence of prior aortic dissection with visualization of the lower and of an endograft within the true lumen of the distal descending thoracic aorta. This endograft extends to the diaphragmatic hiatus. Maximum diameter of the proximal abdominal aorta is stable and measures 3.2 x 3.4 cm. Dissection extends into the left common iliac artery and terminates before the common iliac bifurcation. Celiac: Celiac axis extends off of the false lumen and is patent. SMA: Dissection partially extends into the origin of the SMA and the SMA is patent with most of the flow appearing to emanate off of the false lumen. Renals: Patent right renal artery extends off the true lumen and patent left renal artery appears to emanate off of the posterior aspect of the aorta partially off of the false lumen and potentially with some contribution from the true lumen. IMA: Patent IMA emanates off of the true lumen. Inflow: Bilateral common, external and internal iliac arteries demonstrate normal patency. Proximal Outflow: Normally patent bilateral common femoral arteries and femoral bifurcations. Veins: The splenic vein is patent at the level of the splenic hilum. As it is followed, it is compressed by the pseudocyst centered at the tail of the pancreas. Central portion of the splenic vein appears patent. Other venous structures appear normally patent including the portal vein, visualized mesenteric veins, IVC, bilateral renal veins, iliac veins and common femoral veins. Review of the MIP images confirms the above findings. NON-VASCULAR Lower chest: Bibasilar atelectasis and tiny bilateral pleural effusions. Hepatobiliary: No hepatic lesions or biliary dilatation. Stable gallstones. Pancreas: Head and body of the pancreas demonstrate no necrosis, mass or ductal dilatation. Spleen: Normal in size without focal  abnormality. Adrenals/Urinary Tract: Adrenal glands are unremarkable. Kidneys are normal, without renal calculi, focal lesion, or hydronephrosis. Bladder is unremarkable. Stomach/Bowel: Bowel shows no evidence of obstruction, ileus, inflammation, lesion or free air. Lymphatic: No enlarged lymph nodes identified. Reproductive: Prostate is unremarkable. Other: High-density pseudocyst centered around the tail of the pancreas shows stable to slightly smaller size and measures approximately 5.3 x 9.5 cm transversely. Internal density is again consistent with complex fluid and stable between arterial and venous studies with no evidence of contrast extravasation to suggest active bleeding or focal arterial pseudoaneurysm. Enlarging and maturing pseudocyst is identified medial to the lesser curvature of the stomach and invaginating the capsule of the left lobe of the liver measuring approximately 8.9 x 8.6 cm in transverse dimensions. This region of fluid measured approximately 5.5 cm in diameter on the prior study. This fluid collection does not appear hemorrhagic or infected. Adjacent and slightly more inferior collection abutting the liver and gallbladder measures approximately 3.5 x 6.7 cm and previously measured approximately 5 cm in greatest diameter. This is also consistent with evolving pseudocyst and has identical density to the larger adjacent pseudocyst without evidence of hemorrhage or infection. There is some free fluid in the pericolic gutters and pelvis. Musculoskeletal: No acute or significant osseous findings.  IMPRESSION: 1. Stable to slightly smaller complex high density pseudocyst centered at the level of the tail of the pancreas. CTA demonstrates no evidence to suggest active bleeding within this pseudocyst or associated arterial pseudoaneurysm. The pseudocyst does compress the splenic vein without obvious splenic vein thrombosis. 2. Maturing and enlarging adjacent pseudocysts with roughly 9 cm maturing  pseudocyst between the stomach and left lobe of the liver and adjacent smaller collection abutting the liver and gallbladder. Both of the pseudocysts demonstrate lower internal fluid density and demonstrate no evidence to suggest internal hemorrhage or infection by CT. Electronically Signed   By: Aletta Edouard M.D.   On: 06/04/2019 09:47      Assessment and Plan:  1) Pancreatic pseudocyst 2) Upper abdominal pain 3) Cholelithiasis without cholecystitis  -CTA completed overnight: Evidence of prior aortic dissection with endograft extending to the diaphragmatic hiatus.  Dissection extends into the left common iliac and terminates before common iliac bifurcation.  Dissection partially extends into the origin of the SMA and SMA is patent with most flow emanating off false lumen.  Patent celiac, IMA.  Splenic vein patent and compressed by the pseudocyst. -On CTA, pseudocyst measures 5.3 x 9.5 cm (slightly smaller) with complex fluid without contrast extravasation to suggest active bleeding or pseudoaneurysm.  Also enlarging/maturing pseudocyst medial to the lesser curve of stomach enlarging left lobe of liver measuring 8.9 x 8.6 cm (previously 5.5 cm) 1 cm adjacent/inferior collection abutting liver/GB measuring 3.5 x 6.7 cm (previously 5 cm) c/w evolving pseudocyst. -HIDA scan otherwise negative.  No plan for surgery at this juncture for GB -Pain improving -Improved p.o. intake -I will discuss with advanced biliary service regarding optimal timing of potential pancreatic cyst gastrostomy, but likely as an outpatient  4) Systemic anticoagulation (aortic valve repair) 5) History of aortic dissection -On heparin GTT -H/H stable  6) Leukocytosis -WBC 15.2 (14.9 yesterday).  No fever overnight, but does report sweating -No evidence of infected pseudocyst on CTA -On Rocephin currently -Blood culture from 4/30-no growth -Urine culture collected 4/30 -CXR 4/29 without acute cardiopulmonary  process -We will defer ongoing antimicrobial therapy to primary service, but did explain that his uptrending WBC likely delays any potential discharge  -Ambulate wards with assist     Lavena Bullion, DO  06/04/2019, 9:58 AM [facility] Pager [phone]

## 2019-06-05 ENCOUNTER — Encounter (HOSPITAL_COMMUNITY): Payer: Self-pay | Admitting: Internal Medicine

## 2019-06-05 DIAGNOSIS — R1011 Right upper quadrant pain: Secondary | ICD-10-CM

## 2019-06-05 LAB — GLUCOSE, CAPILLARY
Glucose-Capillary: 214 mg/dL — ABNORMAL HIGH (ref 70–99)
Glucose-Capillary: 198 mg/dL — ABNORMAL HIGH (ref 70–99)

## 2019-06-05 LAB — HEPARIN LEVEL (UNFRACTIONATED)
Heparin Unfractionated: 0.73 IU/mL — ABNORMAL HIGH (ref 0.30–0.70)
Heparin Unfractionated: 0.73 IU/mL — ABNORMAL HIGH (ref 0.30–0.70)

## 2019-06-05 LAB — CBC
HCT: 22.8 % — ABNORMAL LOW (ref 39.0–52.0)
Hemoglobin: 7.3 g/dL — ABNORMAL LOW (ref 13.0–17.0)
MCH: 27.8 pg (ref 26.0–34.0)
MCHC: 32 g/dL (ref 30.0–36.0)
MCV: 86.7 fL (ref 80.0–100.0)
Platelets: 334 10*3/uL (ref 150–400)
RBC: 2.63 MIL/uL — ABNORMAL LOW (ref 4.22–5.81)
RDW: 14.8 % (ref 11.5–15.5)
WBC: 14.1 10*3/uL — ABNORMAL HIGH (ref 4.0–10.5)
nRBC: 0 % (ref 0.0–0.2)

## 2019-06-05 LAB — BASIC METABOLIC PANEL
Anion gap: 10 (ref 5–15)
BUN: 18 mg/dL (ref 8–23)
CO2: 24 mmol/L (ref 22–32)
Calcium: 8 mg/dL — ABNORMAL LOW (ref 8.9–10.3)
Chloride: 104 mmol/L (ref 98–111)
Creatinine, Ser: 0.89 mg/dL (ref 0.61–1.24)
GFR calc Af Amer: >60 mL/min (ref >60)
GFR calc non Af Amer: >60 mL/min (ref >60)
Glucose, Bld: 247 mg/dL — ABNORMAL HIGH (ref 70–99)
Potassium: 3.6 mmol/L (ref 3.5–5.1)
Sodium: 138 mmol/L (ref 135–145)

## 2019-06-05 LAB — RETICULOCYTES
Immature Retic Fract: 30.5 % — ABNORMAL HIGH (ref 2.3–15.9)
RBC.: 2.57 MIL/uL — ABNORMAL LOW (ref 4.22–5.81)
Retic Count, Absolute: 51.1 10*3/uL (ref 19.0–186.0)
Retic Ct Pct: 2 % (ref 0.4–3.1)

## 2019-06-05 LAB — FERRITIN: Ferritin: 844 ng/mL — ABNORMAL HIGH (ref 24–336)

## 2019-06-05 LAB — IRON AND TIBC
Iron: 37 ug/dL — ABNORMAL LOW (ref 45–182)
Saturation Ratios: 28 % (ref 17.9–39.5)
TIBC: 133 ug/dL — ABNORMAL LOW (ref 250–450)
UIBC: 96 ug/dL

## 2019-06-05 LAB — HEMOGLOBIN AND HEMATOCRIT, BLOOD
HCT: 26.9 % — ABNORMAL LOW (ref 39.0–52.0)
Hemoglobin: 8.8 g/dL — ABNORMAL LOW (ref 13.0–17.0)

## 2019-06-05 LAB — VITAMIN B12: Vitamin B-12: 1748 pg/mL — ABNORMAL HIGH (ref 180–914)

## 2019-06-05 LAB — FOLATE: Folate: 11.8 ng/mL (ref >5.9)

## 2019-06-05 LAB — PROTIME-INR
INR: 1.7 — ABNORMAL HIGH (ref 0.8–1.2)
Prothrombin Time: 19.5 seconds — ABNORMAL HIGH (ref 11.4–15.2)

## 2019-06-05 MED ORDER — WARFARIN SODIUM 5 MG PO TABS: 5.0000 mg | ORAL_TABLET | Freq: Once | ORAL | 0 refills | Status: DC

## 2019-06-05 MED ORDER — DIPHENHYDRAMINE-ZINC ACETATE 2-0.1 % EX CREA
TOPICAL_CREAM | Freq: Three times a day (TID) | CUTANEOUS | Status: DC | PRN
  Administered 2019-06-05: 1 via TOPICAL
  Filled 2019-06-05: qty 28

## 2019-06-05 MED ORDER — ENSURE ENLIVE PO LIQD: 237.0000 mL | Freq: Three times a day (TID) | ORAL | 12 refills | Status: DC

## 2019-06-05 MED ORDER — ENOXAPARIN SODIUM 100 MG/ML ~~LOC~~ SOLN
1.0000 mg/kg | Freq: Two times a day (BID) | SUBCUTANEOUS | 0 refills | Status: DC
Start: 2019-06-05 — End: 2019-07-20

## 2019-06-05 MED ORDER — DOCUSATE SODIUM 100 MG PO CAPS
200.0000 mg | ORAL_CAPSULE | Freq: Two times a day (BID) | ORAL | Status: DC
  Administered 2019-06-05: 200 mg via ORAL
  Filled 2019-06-05: qty 2

## 2019-06-05 MED ORDER — SODIUM CHLORIDE 0.9 % IV SOLN
510.0000 mg | Freq: Once | INTRAVENOUS | Status: AC
  Administered 2019-06-05: 510 mg via INTRAVENOUS
  Filled 2019-06-05: qty 17

## 2019-06-05 MED ORDER — TRAMADOL HCL 50 MG PO TABS: 50.0000 mg | ORAL_TABLET | Freq: Four times a day (QID) | ORAL | 0 refills | Status: DC | PRN

## 2019-06-05 MED ORDER — METFORMIN HCL 500 MG PO TABS: 500.0000 mg | ORAL_TABLET | Freq: Two times a day (BID) | ORAL | 0 refills | Status: DC

## 2019-06-05 MED ORDER — WARFARIN SODIUM 5 MG PO TABS
5.0000 mg | ORAL_TABLET | Freq: Once | ORAL | Status: AC
  Administered 2019-06-05: 5 mg via ORAL
  Filled 2019-06-05: qty 1

## 2019-06-05 MED ORDER — CEFDINIR 300 MG PO CAPS
300.0000 mg | ORAL_CAPSULE | Freq: Two times a day (BID) | ORAL | 0 refills | Status: DC
Start: 2019-06-05 — End: 2019-08-26

## 2019-06-05 MED ORDER — ONDANSETRON HCL 4 MG PO TABS: 4.0000 mg | ORAL_TABLET | Freq: Four times a day (QID) | ORAL | 0 refills | Status: DC | PRN

## 2019-06-05 NOTE — Progress Notes (Signed)
[place] for Heparin> warfarin Indication: Mechanical aortic valve  Allergies  Allergen Reactions  . Codeine Anaphylaxis  . Gadolinium Derivatives Other (See Comments)    Other Reaction: intense sneezing MRI contrast  . Hydrocodone Anaphylaxis  . Iodinated Diagnostic Agents Other (See Comments)    Uncontrollable sneezing, needs sedative the night before and hour and benadryl before using Other Reaction: sneezing with SOB after prep  . Statins Other (See Comments)    Myalgias  . Hydromorphone Other (See Comments)  . Lipitor [Atorvastatin Calcium] Other (See Comments)    Myalgias   . Seroquel [Quetiapine Fumerate] Other (See Comments)    "bad trip" tongue swell    Patient Measurements: Height: 6\' 1"  (185.4 cm) Weight: 97.6 kg (215 lb 2.7 oz) IBW/kg (Calculated) : 79.9  Heparin Dosing Weight: 97.6 kg  Vital Signs: Temp: 97.8 F (36.6 C) (05/02 0845) Temp Source: Oral (05/02 0845) BP: 119/62 (05/02 0845) Pulse Rate: 75 (05/02 0845)  Labs: Recent Labs    06/03/19 [id number] 06/03/19 0910 06/03/19 1516 06/03/19 1716 06/04/19 0515 06/05/19 0343 06/05/19 1212  HGB 8.5*   < >   < > 8.1* 7.9* 7.3*  --   HCT 26.4*   < >  --  24.6* 24.7* 22.8*  --   PLT 373  --   --   --  356 334  --   LABPROT 20.1*  --   --   --  19.0* 19.5*  --   INR 1.8*  --   --   --  1.7* 1.7*  --   HEPARINUNFRC  --    < >   < >  --  0.46 0.73* 0.73*  CREATININE 0.95  --   --   --  0.87 0.89  --    < > = values in this interval not displayed.    Estimated Creatinine Clearance: 95 mL/min (by C-G formula based on SCr of 0.89 mg/dL).  Assessment: 69 year old male on warfarin PTA for mechanical aortic valve replacement (Goal INR on warfarin is 2 to 2.5, per last outpatient anticoagulation visit; PTA dose 7mg  daily except 5mg  on Tuesdays; last PTA dose 4/24) was admitted with abdominal pain and hemorrhagic cyst of the pancreas, with plans for conservative treatment for  now. Pharmacy consulted for IV heparin while warfarin is being held.  Today, heparin level remains supratherapeutic at 0.73 despite infusion rate decrease overnight. Hgb trending down to 7.3 and platelets WNL. Anemia workup initiated and IV iron infusing now. Mr Sekel also experienced one episode of epistaxis overnight which resolved quickly. After speaking with patient today, it seems that occasional nosebleeds are common for him, and he has been under the care of an ENT to manage these episodes. No other bleeding reported, and no issues with the heparin infusion per RN. INR remains unchanged at 1.7 after restarting warfarin yesterday afternoon.   Goal of Therapy:  Heparin level: 0.3-0.7 units/ml (prefer lower end of range due to hemorrhagic cyst of pancreas and epistaxis episode) INR: 2-2.5 Monitor platelets by anticoagulation protocol: Yes   Plan:  Decrease heparin to 1850 units/hr Warfarin 5mg  x1 tonight Check 8h heparin level Monitor CBC, daily heparin level, daily PT/INR  Continue to monitor for signs/symptoms of bleeding Continue heparin infusion overlap with warfarin for at least 5 days and until INR >2 for 48 hours   Brendolyn Patty, PharmD PGY2 Pharmacy Resident Phone [phone]

## 2019-06-05 NOTE — Discharge Instructions (Signed)

## 2019-06-05 NOTE — Progress Notes (Signed)
ANTICOAGULATION CONSULT NOTE - Follow Up Consult  Pharmacy Consult for heparin Indication: AVR  Labs: Recent Labs    06/03/19 0635 06/03/19 0635 06/03/19 0910 06/03/19 1516 06/03/19 1716 06/03/19 1716 06/04/19 0515 06/05/19 0343  HGB 8.5*   < >  --   --  8.1*   < > 7.9* 7.3*  HCT 26.4*   < >  --   --  24.6*  --  24.7* 22.8*  PLT 373  --   --   --   --   --  356 334  LABPROT 20.1*  --   --   --   --   --  19.0* 19.5*  INR 1.8*  --   --   --   --   --  1.7* 1.7*  HEPARINUNFRC  --   --    < > 0.37  --   --  0.46 0.73*  CREATININE 0.95  --   --   --   --   --  0.87 0.89   < > = values in this interval not displayed.    Assessment: 70yo male supratherapeutic on heparin after two levels at goal though had been trending up; no gtt issues or signs of bleeding per RN.  Goal of Therapy:  Heparin level 0.3-0.7 units/ml   Plan:  Will decrease heparin gtt by ~1 units/kg/hr to 2000 units/hr and check level in 6 hours.    Wynona Neat, PharmD, BCPS  06/05/2019,5:28 AM

## 2019-06-05 NOTE — Discharge Summary (Signed)
Physician Discharge Summary  [place] [name] DOB: 02-26-1950 DOA: 05/29/2019  PCP: Alexander Haven, MD  Admit date: 05/29/2019 Discharge date: 06/05/2019  Time spent: 45 minutes  Recommendations for Outpatient Follow-up:  Patient will be discharged to home.  Patient will need to follow up with primary care provider within one week of discharge, repeat CBC.  Repeat INR in 2 days.  Follow-up with general surgery.  Patient should continue medications as prescribed.  Patient should follow a soft diet. Do not start Metformin until 06/07/2019.  Discharge Diagnoses:  Intractable abdominal pain Complex hemorrhagic cyst of the pancreas Cholelithiasis with gallbladder sludge Supratherapeutic INR History of mechanical aortic valve replacement/stroke COPD Hyperglycemia/New onset diabetes mellitus, type II Chronic normocytic anemia Chronic abdominal aortic dissection  SIRS Hyperlipidemia  Discharge Condition: Stable  Diet recommendation: soft   Filed Weights   05/30/19 0626  Weight: 97.6 kg    History of present illness:  On 05/30/2019 by Dr. Bunnie Pion Satira Anis Barton a 69 y.o.malewith medical history significant ofhypertension, hyperlipidemia, aortic valve replacement, stroke and COPD presented to ED for evaluation of worsening nominal pain with nausea and vomiting. Patient states that the pain is ongoing for more than 2 weeks but continue to worsen since last week. Pain is located mainly in epigastric region but become generalized and upper abdomen and is related with severe nausea and vomiting patient further mentioned that he is unable to eat and drink for the last few days because of worsening nausea and vomiting. Patient further mentioned that he had a CT scan last week and it showed a new pancreatic mass. Patient is scheduled for gastroenterology appointment on Tuesday. Patient otherwise denies fever, chills, chest pain, shortness of breath and urinary  symptoms.   Hospital Course:  Intractable abdominal pain -Likely secondary to gallstones causing pancreatitis and pancreatic pseudocyst with intracystic hemorrhage -General surgery as well as gastroenterology consulted and appreciated -Patient placed on Rocephin empirically -states he cannot tolerate any medications with codeine and that tramadol has helped him in the past -Abdominal appears better today  -CTA abdomen pelvis showed slightly smaller complex high density pseudocyst.  Maturing and enlarging adjacent cirrhosis with roughly 9 cm maturing pseudocyst between the stomach and the left lobe of the liver, adjacent smaller collection abutting the liver and gallbladder. -Placed on soft diet and tolerated well -Need to follow-up with advanced biliary GI service at [facility] -He will also need to follow-up with general surgery -Will discharge patient with cefdinir 300 mg twice daily  Complex hemorrhagic cyst of the pancreas -Noted on MRI -As above GI and surgery on board -Continue conservative treatment -Gastroenterology would like vascular surgery to weigh in -Patient currently on Coumadin for mechanical aortic valve.    Cholelithiasis with gallbladder sludge -CT scan and ultrasound performed, MRI abdomen shows mild gallbladder wall thickening and inflammation -Conservative management for now with possibility of surgery in 4 to 6 weeks -General surgery ordered HIDA scan which is normal, no acute cholecystitis   Supratherapeutic INR -INR was up to 4.1, currently down to 1.7 -Coumadin was initially held however has been restarted  History of mechanical aortic valve replacement/stroke -As above, Coumadin currently held and he was placed on heparin drip -given no surgery, and no bleeding noted on CT scan, -Coumadin restarted, INR today 1.7 -Patient would like to go home therefore will be discharged on Lovenox injections -He is to have his INR rechecked in 2 days -Discussed that  antibiotics can also affect his INR  COPD -  Currently compensated, no wheezing noted on exam -Continue home medications of Spiriva, Symbicort -Nebulizer treatments as needed  Hyperglycemia/New onset diabetes mellitus, type II -Patient denies history of diabetes -A1c in 2018 was 6.1, repeat A1c on 05/30/2019 was 7.3 -Placed on insulin sliding scale for now -Will discharge patient with Metformin 500 mg twice daily starting on 5/4 given that he received IV contrast   Chronic normocytic anemia -hemoglobin 8.8 -Discussed signs and symptoms of anemia with patient -Anemia panel shows iron 37, ferritin 844 -Patient given dose of Feraheme  Chronic abdominal aortic dissection  -Vascular surgery consulted and appreciated, no invention needed at this time. Paitent follows with Cardiothoracic surgeon at [facility].   SIRS -Patient had intermittent fevers and elevated leukocytosis up to 15.2  -No further fevers over the past 48 hours, BC down to 14 this morning -Currently he is already on antibiotics, ceftriaxone -we will discharge patient with cefdinir -Chest x-ray obtained and unremarkable for infection -Patient with no urinary complaints -UA unmarkable for infection -Blood cultures show no growth to date -CT A/P showed no evidence of infection -?secondary to above process -Repeat CBC in 1 week  Hyperlipidemia -Patient currently enrolled in a trial and on desiccated medication -Wife brought in the medication, sent to pharmacy for administration  Consultants General Surgery Gastroenterology  Procedures  HIDA  Discharge Exam: Vitals:   06/05/19 0845 06/05/19 1345  BP: 119/62 115/62  Pulse: 75 70  Resp: 16 16  Temp: 97.8 F (36.6 C) 98 F (36.7 C)  SpO2: 96% 97%     General: Well developed, well nourished, NAD, appears stated age  HEENT: NCAT,  mucous membranes moist.  Cardiovascular: S1 S2 auscultated, RRR, no murmur  Respiratory: Clear to auscultation  bilaterally  Abdomen: Soft, nontender, nondistended, + bowel sounds  Extremities: warm dry without cyanosis clubbing or edema  Neuro: AAOx3, nonfocal  Psych: Normal affect and demeanor with intact judgement and insight  Discharge Instructions Discharge Instructions    Discharge instructions   Complete by: As directed    Patient will be discharged to home.  Patient will need to follow up with primary care provider within one week of discharge, repeat CBC.  Repeat INR in 2 days.  Follow-up with general surgery.  Patient should continue medications as prescribed.  Patient should follow a soft diet. Do not start Metformin until 06/07/2019.     Allergies as of 06/05/2019      Reactions   Codeine Anaphylaxis   Gadolinium Derivatives Other (See Comments)   Other Reaction: intense sneezing MRI contrast   Hydrocodone Anaphylaxis   Iodinated Diagnostic Agents Other (See Comments)   Uncontrollable sneezing, needs sedative the night before and hour and benadryl before using Other Reaction: sneezing with SOB after prep   Statins Other (See Comments)   Myalgias   Hydromorphone Other (See Comments)   Lipitor [atorvastatin Calcium] Other (See Comments)   Myalgias   Seroquel [quetiapine Fumerate] Other (See Comments)   "bad trip" tongue swell      Medication List    STOP taking these medications   amoxicillin 500 MG capsule Commonly known as: AMOXIL   aspirin 81 MG tablet   benzonatate 100 MG capsule Commonly known as: TESSALON   metoprolol succinate 25 MG 24 hr tablet Commonly known as: Toprol XL     TAKE these medications   acetaminophen 500 MG tablet Commonly known as: TYLENOL Take 1,000 mg by mouth every 6 (six) hours as needed for mild pain.   albuterol 108 (  90 Base) MCG/ACT inhaler Commonly known as: VENTOLIN HFA Inhale 2 puffs into the lungs every 4 (four) hours as needed for wheezing or shortness of breath.   budesonide-formoterol 160-4.5 MCG/ACT inhaler Commonly known  as: Symbicort TAKE 2 PUFFS BY MOUTH TWICE A DAY What changed:   how much to take  how to take this  when to take this  additional instructions   cefdinir 300 MG capsule Commonly known as: OMNICEF Take 1 capsule (300 mg total) by mouth 2 (two) times daily.   clobetasol ointment 0.05 % Commonly known as: TEMOVATE Apply 1 application topically daily as needed for rash.   enoxaparin 100 MG/ML injection Commonly known as: LOVENOX Inject 1 mL (100 mg total) into the skin 2 (two) times daily for 7 days.   feeding supplement (ENSURE ENLIVE) Liqd Take 237 mLs by mouth 3 (three) times daily between meals.   furosemide 20 MG tablet Commonly known as: LASIX TAKE 1/2 TABLET (10 MG) 1 OR 2 TIMES A DAY AS NEEDED FOR LEG SWELLING What changed: See the new instructions.   gentamicin ointment 0.1 % Commonly known as: GARAMYCIN Apply 1 application topically daily.   Investigational - Study Medication Take 1 tablet by mouth daily. Study name: [id number] Additional study details: cholesterol [facility] [phone]   metFORMIN 500 MG tablet Commonly known as: Glucophage Take 1 tablet (500 mg total) by mouth 2 (two) times daily with a meal.   multivitamin with minerals Tabs tablet Take 1 tablet by mouth daily.   ondansetron 4 MG tablet Commonly known as: ZOFRAN Take 1 tablet (4 mg total) by mouth every 6 (six) hours as needed for nausea.   OVER THE COUNTER MEDICATION Take 1 capsule by mouth daily. Dotera Digestive Supplement   phenylephrine 0.25 % nasal spray Commonly known as: NEO-SYNEPHRINE Place 2 sprays into both nostrils daily as needed (nose bleeds).   polyethylene glycol 17 g packet Commonly known as: MIRALAX / GLYCOLAX Take 17 g by mouth daily.   predniSONE 50 MG tablet Commonly known as: DELTASONE Take 1 tablet (50 mg) by mouth 13 hours prior to your scan, then take 1 tablet (50 mg) by mouth 7 hours prior to scan, then take 1 tablet (50 mg) by mouth 1 hour  prior to your scan   senna 8.6 MG Tabs tablet Commonly known as: SENOKOT Take 2 tablets by mouth in the morning and at bedtime.   tamsulosin 0.4 MG Caps capsule Commonly known as: FLOMAX TAKE 2 CAPSULES BY MOUTH EVERY DAY What changed:   how much to take  when to take this   Tiotropium Bromide Monohydrate 2.5 MCG/ACT Aers Commonly known as: Spiriva Respimat INHALE 2.5 MCG INTO THE LUNGS 2 (TWO) TIMES DAILY. What changed:   how much to take  how to take this  when to take this  additional instructions   traMADol 50 MG tablet Commonly known as: Ultram Take 1 tablet (50 mg total) by mouth every 6 (six) hours as needed. What changed:   when to take this  reasons to take this   warfarin 5 MG tablet Commonly known as: COUMADIN Take as directed. If you are unsure how to take this medication, talk to your nurse or doctor. Original instructions: Take 1 tablet (5 mg total) by mouth one time only at 4 PM. What changed:   See the new instructions.  Another medication with the same name was removed. Continue taking this medication, and follow the directions you see here.  Zinc 50 MG Caps Take 1 capsule by mouth daily.      Allergies  Allergen Reactions  . Codeine Anaphylaxis  . Gadolinium Derivatives Other (See Comments)    Other Reaction: intense sneezing MRI contrast  . Hydrocodone Anaphylaxis  . Iodinated Diagnostic Agents Other (See Comments)    Uncontrollable sneezing, needs sedative the night before and hour and benadryl before using Other Reaction: sneezing with SOB after prep  . Statins Other (See Comments)    Myalgias  . Hydromorphone Other (See Comments)  . Lipitor [Atorvastatin Calcium] Other (See Comments)    Myalgias   . Seroquel [Quetiapine Fumerate] Other (See Comments)    "bad trip" tongue swell   Follow-up Information    Coralie Keens, MD Follow up in 1 month(s).   Specialty: General Surgery Contact information: [address] [place] [address] [phone]        Alexander Haven, MD. Schedule an appointment as soon as possible for a visit in 1 week(s).   Specialty: Family Medicine Why: Hosptial follow up Contact information: [address] [place] [address] [phone]            The results of significant diagnostics from this hospitalization (including imaging, microbiology, ancillary and laboratory) are listed below for reference.    Significant Diagnostic Studies: CT ABDOMEN PELVIS WO CONTRAST  Result Date: 05/26/2019 CLINICAL DATA:  Abdominal pain starting in the left upper quadrant and moving to the right abdomen. Nausea but no vomiting. Worsening over the last 4 days. EXAM: CT ABDOMEN AND PELVIS WITHOUT CONTRAST TECHNIQUE: Multidetector CT imaging of the abdomen and pelvis was performed following the standard protocol without IV contrast. COMPARISON:  CTA 10/20/2017 FINDINGS: Lower chest: Subsegmental atelectasis in the lung bases. Inferior aspect of thoracic aortic stent graft evident. Hepatobiliary: 2.6 cm low-density lesion is identified in or adjacent to the caudate lobe. This is new in the interval since 10/20/2017. The liver shows diffusely decreased attenuation suggesting fat deposition. Calcified gallstones evident. No intrahepatic or extrahepatic biliary dilation. Pancreas: 10.5 x 6.1 x 10.3 cm heterogeneous multiloculated relatively well-defined lesion replaces the pancreatic tail. This mass is continuous with the posterior gastric wall and inferior spleen. No dilatation of the main duct. Spleen: No splenomegaly. No focal mass lesion. Adrenals/Urinary Tract: No adrenal nodule or mass. 3.5 cm cyst noted lower pole right kidney, stable. Unremarkable noncontrast appearance of the left kidney. No evidence for hydroureter. The urinary bladder appears normal for the degree of distention. Stomach/Bowel: Stomach is unremarkable. No gastric wall thickening. No evidence of outlet  obstruction. Duodenum is normally positioned as is the ligament of Treitz. No small bowel wall thickening. No small bowel dilatation. The terminal ileum is normal. The appendix is normal. No gross colonic mass. No colonic wall thickening. Vascular/Lymphatic: There is abdominal aortic atherosclerosis without aneurysm. Displacement of intimal calcification from the peripheral wall of the aorta is compatible with dissection as documented on previous CTA. 1.4 cm short axis gastrohepatic ligament lymph node identified on image 24 of series 2. No retroperitoneal lymphadenopathy. No pelvic sidewall lymphadenopathy. Reproductive: The prostate gland and seminal vesicles are unremarkable. Other: No intraperitoneal free fluid. Musculoskeletal: Postsurgical/vascular axis changes noted in the right groin region. 14 mm fluid collection superficial to the right common femoral artery and vein likely a tiny seroma or chronic hematoma. Pseudoaneurysm cannot be entirely excluded on this noncontrast study. No worrisome lytic or sclerotic osseous abnormality. Degenerative changes noted lumbar spine. IMPRESSION: 1. Large heterogeneous  multi lobular mass replacing the pancreatic tail and contiguous with the posterior stomach and inferior spleen. This is new since 10/20/2017. Assessment limited on this noncontrast study. This could be related to chronic complex pseudocyst or large hematoma although gastrohepatic ligament lymphadenopathy raises concern for neoplasm. MRI with and without may provide additional characterization. Ultimately, endoscopic ultrasound may prove the most helpful to further evaluate. 2. 2.6 cm low-density lesion at the caudate lobe may be subcapsular. This could also be further assessed by MRI. 3. Hepatic steatosis. 4. Cholelithiasis. 5. Chronic thoracoabdominal aortic dissection with inferior aspect of thoracic aortic stent graft evident. Electronically Signed   By: Misty Stanley M.D.   On: 05/26/2019 10:16   MR  Abdomen W or Wo Contrast  Result Date: 05/30/2019 CLINICAL DATA:  Pancreatic tail mass. Possible hemorrhagic pseudocyst EXAM: MRI ABDOMEN WITHOUT AND WITH CONTRAST TECHNIQUE: Multiplanar multisequence MR imaging of the abdomen was performed both before and after the administration of intravenous contrast. CONTRAST:  44mL GADAVIST GADOBUTROL 1 MMOL/ML IV SOLN COMPARISON:  CT scans 05/26/2019 and 05/29/2019 FINDINGS: Lower chest: The lung bases demonstrates streaky atelectasis but no pleural or pericardial effusion. No worrisome pulmonary lesions. The distal esophagus is grossly normal. Aortic dissection is again demonstrated. Hepatobiliary: No hepatic lesions are identified. No intrahepatic biliary dilatation. The portal and hepatic veins are patent. Gallstones are noted in the gallbladder along with some layering gallbladder sludge. There is mild gallbladder wall thickening and inflammation. Normal caliber and course of the common bile duct. No common bile duct stones are identified. Pancreas: The pancreatic head is normal. No mass or acute inflammation. The main pancreatic duct is normal in caliber and has a normal course. Persistent large heterogeneous masslike lesion in the pancreatic tail measures approximately 8.8 x 5.3 cm. Heterogeneous T1 and T2 signal intensity with several areas of increased T1 signal intensity consistent with hemorrhage. No contrast enhancement is demonstrated. Findings most consistent with a complex hemorrhagic pseudocyst. Spleen:  Normal size. No focal lesions. Adrenals/Urinary Tract: The adrenal glands and kidneys are unremarkable. Simple right renal cyst is noted. Stomach/Bowel: The stomach, duodenum, visualized small bowel loops and visualized colon are unremarkable. Vascular/Lymphatic: Aortic dissection is again demonstrated which is a continuation of the thoracic aortic dissection. This continues all the way down to the bifurcation. The branch vessels are patent. Scattered small  mesenteric and retroperitoneal lymph nodes. Fluid is noted along the medial liver edge and there is a small focal fluid collection along the anterior wall of the antral region of the stomach. Thin rim like enhancement is noted. This is likely a small pseudocyst. Other:  None. Musculoskeletal: Musculoskeletal no significant bony findings. IMPRESSION: 1. 8.8 x 5.3 cm heterogeneous masslike lesion in the pancreatic tail is most consistent with a complex hemorrhagic pseudocyst. 2. Cholelithiasis with mild gallbladder wall thickening and inflammation. 3. No common bile duct stones or biliary dilatation. 4. Chronic aortic dissection.  Aortic branch vessels are patent. 5. Small fluid collection along the anterior wall of the stomach, likely a small pseudocyst. There is also fluid along the medial liver edge which does not appear organized. Electronically Signed   By: Marijo Sanes M.D.   On: 05/30/2019 08:05   NM Hepatobiliary Liver Func  Result Date: 06/02/2019 CLINICAL DATA:  Evaluate for cholecystitis. EXAM: NUCLEAR MEDICINE HEPATOBILIARY IMAGING TECHNIQUE: Sequential images of the abdomen were obtained out to 60 minutes following intravenous administration of radiopharmaceutical. RADIOPHARMACEUTICALS:  5.4 mCi Tc-77m  Choletec IV COMPARISON:  Ultrasound and CT 05/29/2019, MRI abdomen  05/30/2019 FINDINGS: Prompt uptake and biliary excretion of activity by the liver is seen. Gallbladder activity is visualized at 25 minutes, consistent with patency of cystic duct. Biliary activity passes into small bowel at 10-15 minutes, consistent with patent common bile duct. IMPRESSION: Normal HIDA scan without evidence of acute cholecystitis. Electronically Signed   By: Marin Olp M.D.   On: 06/02/2019 12:44   CT ABDOMEN PELVIS W CONTRAST  Result Date: 05/29/2019 CLINICAL DATA:  Epigastric pain and nausea x4 weeks. EXAM: CT ABDOMEN AND PELVIS WITH CONTRAST TECHNIQUE: Multidetector CT imaging of the abdomen and pelvis was  performed using the standard protocol following bolus administration of intravenous contrast. CONTRAST:  167mL OMNIPAQUE IOHEXOL 300 MG/ML  SOLN COMPARISON:  May 26, 2019 FINDINGS: Lower chest: Multiple sternal wires are seen. Mild areas of atelectasis and/or scarring are seen within the bilateral lung bases. There is stenting of the visualized portion of the descending thoracic aorta. Hepatobiliary: No focal liver abnormality is seen. The 2.6 cm low-attenuation liver lesion seen within the region of the caudate lobe on the prior study is not clearly identified on the current exam. Mild perihepatic fluid is seen within this region as well as the adjacent portion of the anterior right lobe of the liver. This represents a new finding when compared to the prior exam. Subcentimeter gallstones are seen within the gallbladder lumen without evidence of gallbladder wall thickening or biliary dilatation. Pancreas: A 9.6 cm x 5.5 cm well-defined area of heterogeneous low attenuation is again seen within the tail of the pancreas. Spleen: Normal in size without focal abnormality. Adrenals/Urinary Tract: Adrenal glands are unremarkable. Kidneys are normal, without renal calculi or hydronephrosis. A 3.2 cm simple cyst is seen within the posterior aspect of the lower pole of the right kidney. Bladder is unremarkable. Stomach/Bowel: There is mild thickening of the gastric antrum. Appendix appears normal. No evidence of bowel dilatation. Vascular/Lymphatic: Moderate to marked severity aortic calcification and atherosclerosis is seen with chronic, partially calcified dissection extending along the length of the abdominal aorta to the level of the bifurcation. No enlarged abdominal or pelvic lymph nodes. Mild right upper quadrant inflammatory fat stranding is seen within the region in between the gastric antrum/proximal duodenum and the right lobe of the liver. This extends inferiorly along the lateral aspect of the mid to upper  right abdomen and represents a new finding when compared to the prior study. Reproductive: Prostate is unremarkable. Other: A mild-to-moderate amount of posterior pelvic free fluid is seen. Musculoskeletal: Degenerative changes seen throughout the lumbar spine. IMPRESSION: 1. Cholelithiasis without evidence of acute cholecystitis. 2. 9.6 cm x 5.5 cm well-defined area of heterogeneous low attenuation is again seen within the tail of the pancreas. Further evaluation with MRI is recommended. 3. Interval development of mild right upper quadrant inflammatory fat stranding since the prior study. This may represent sequelae associated with mild gastritis within the region of the gastric antrum. 4. Moderate to marked severity aortic calcification and atherosclerosis with chronic, partially calcified dissection extending along the length of the abdominal aorta to the level of the bifurcation. 5. 3.2 cm right renal cyst. 6. Mild-to-moderate amount of posterior pelvic free fluid, increased in severity when compared to the prior study. Aortic Atherosclerosis (ICD10-I70.0). Electronically Signed   By: Virgina Norfolk M.D.   On: 05/29/2019 20:12   DG CHEST PORT 1 VIEW  Result Date: 06/02/2019 CLINICAL DATA:  Shortness of breath EXAM: PORTABLE CHEST 1 VIEW COMPARISON:  Chest radiograph and chest CT October 20, 2017 FINDINGS: There is bibasilar atelectasis. The lungs elsewhere are clear. Heart is upper normal in size with pulmonary vascularity normal. There is aneurysmal dilatation of the aorta with stent in the aorta extending from the mid ascending aorta to the distal descending aorta. Patient is status post coronary artery bypass grafting. Surgical clips in left axilla. No adenopathy. Surgical clips also noted in right axillary region. IMPRESSION: Stent placement within the aorta for diffuse aneurysm. Bibasilar atelectasis. No edema or airspace opacity. Stable heart size. Electronically Signed   By: Lowella Grip III  M.D.   On: 06/02/2019 14:57   CT Angio Abd/Pel w/ and/or w/o  Result Date: 06/04/2019 CLINICAL DATA:  Pancreatitis and pancreatic pseudocyst with evidence internal hemorrhage of a dominant pseudocyst at the level of the tail of the pancreas by prior imaging. EXAM: CT ANGIOGRAPHY ABDOMEN AND PELVIS WITH CONTRAST TECHNIQUE: Multidetector CT imaging of the abdomen and pelvis was performed using the standard protocol during bolus administration of intravenous contrast. Multiplanar reconstructed images and MIPs were obtained and reviewed to evaluate the vascular anatomy. CONTRAST:  145mL OMNIPAQUE IOHEXOL 350 MG/ML SOLN COMPARISON:  MRI of the abdomen on 05/30/2019 and CT of the abdomen on 05/29/2019 FINDINGS: VASCULAR Aorta: Evidence of prior aortic dissection with visualization of the lower and of an endograft within the true lumen of the distal descending thoracic aorta. This endograft extends to the diaphragmatic hiatus. Maximum diameter of the proximal abdominal aorta is stable and measures 3.2 x 3.4 cm. Dissection extends into the left common iliac artery and terminates before the common iliac bifurcation. Celiac: Celiac axis extends off of the false lumen and is patent. SMA: Dissection partially extends into the origin of the SMA and the SMA is patent with most of the flow appearing to emanate off of the false lumen. Renals: Patent right renal artery extends off the true lumen and patent left renal artery appears to emanate off of the posterior aspect of the aorta partially off of the false lumen and potentially with some contribution from the true lumen. IMA: Patent IMA emanates off of the true lumen. Inflow: Bilateral common, external and internal iliac arteries demonstrate normal patency. Proximal Outflow: Normally patent bilateral common femoral arteries and femoral bifurcations. Veins: The splenic vein is patent at the level of the splenic hilum. As it is followed, it is compressed by the pseudocyst  centered at the tail of the pancreas. Central portion of the splenic vein appears patent. Other venous structures appear normally patent including the portal vein, visualized mesenteric veins, IVC, bilateral renal veins, iliac veins and common femoral veins. Review of the MIP images confirms the above findings. NON-VASCULAR Lower chest: Bibasilar atelectasis and tiny bilateral pleural effusions. Hepatobiliary: No hepatic lesions or biliary dilatation. Stable gallstones. Pancreas: Head and body of the pancreas demonstrate no necrosis, mass or ductal dilatation. Spleen: Normal in size without focal abnormality. Adrenals/Urinary Tract: Adrenal glands are unremarkable. Kidneys are normal, without renal calculi, focal lesion, or hydronephrosis. Bladder is unremarkable. Stomach/Bowel: Bowel shows no evidence of obstruction, ileus, inflammation, lesion or free air. Lymphatic: No enlarged lymph nodes identified. Reproductive: Prostate is unremarkable. Other: High-density pseudocyst centered around the tail of the pancreas shows stable to slightly smaller size and measures approximately 5.3 x 9.5 cm transversely. Internal density is again consistent with complex fluid and stable between arterial and venous studies with no evidence of contrast extravasation to suggest active bleeding or focal arterial pseudoaneurysm. Enlarging and maturing pseudocyst is identified medial to the lesser  curvature of the stomach and invaginating the capsule of the left lobe of the liver measuring approximately 8.9 x 8.6 cm in transverse dimensions. This region of fluid measured approximately 5.5 cm in diameter on the prior study. This fluid collection does not appear hemorrhagic or infected. Adjacent and slightly more inferior collection abutting the liver and gallbladder measures approximately 3.5 x 6.7 cm and previously measured approximately 5 cm in greatest diameter. This is also consistent with evolving pseudocyst and has identical density  to the larger adjacent pseudocyst without evidence of hemorrhage or infection. There is some free fluid in the pericolic gutters and pelvis. Musculoskeletal: No acute or significant osseous findings. IMPRESSION: 1. Stable to slightly smaller complex high density pseudocyst centered at the level of the tail of the pancreas. CTA demonstrates no evidence to suggest active bleeding within this pseudocyst or associated arterial pseudoaneurysm. The pseudocyst does compress the splenic vein without obvious splenic vein thrombosis. 2. Maturing and enlarging adjacent pseudocysts with roughly 9 cm maturing pseudocyst between the stomach and left lobe of the liver and adjacent smaller collection abutting the liver and gallbladder. Both of the pseudocysts demonstrate lower internal fluid density and demonstrate no evidence to suggest internal hemorrhage or infection by CT. Electronically Signed   By: Aletta Edouard M.D.   On: 06/04/2019 09:47   US Abdomen Limited RUQ  Result Date: 05/29/2019 CLINICAL DATA:  Right upper quadrant pain. EXAM: ULTRASOUND ABDOMEN LIMITED RIGHT UPPER QUADRANT COMPARISON:  None. FINDINGS: Gallbladder: Multiple subcentimeter echogenic gallstones are seen within the gallbladder lumen without evidence of gallbladder wall thickening (1.2 mm). A mild amount of echogenic gallbladder sludge is also seen. A small amount of pericholecystic fluid is seen. No sonographic Murphy sign noted by sonographer. Common bile duct: Diameter: 3.8 mm proximally with limited visualization distally. Liver: No focal lesion identified. Diffusely increased echogenicity of the liver parenchyma is noted. Portal vein is patent on color Doppler imaging with normal direction of blood flow towards the liver. Other: A mild amount of fluid is seen adjacent to the caudate lobe and left lobe of the liver. A complex right pleural effusion is also seen. IMPRESSION: 1. Cholelithiasis and gallbladder sludge without evidence of acute  cholecystitis. 2. Free fluid adjacent to the caudate lobe and left lobe of the liver with a complex right pleural effusion. Electronically Signed   By: Virgina Norfolk M.D.   On: 05/29/2019 21:08    Microbiology: Recent Results (from the past 240 hour(s))  SARS CORONAVIRUS 2 (TAT 6-24 HRS) Nasopharyngeal Nasopharyngeal Swab     Status: None   Collection Time: 05/30/19 12:25 AM   Specimen: Nasopharyngeal Swab  Result Value Ref Range Status   SARS Coronavirus 2 NEGATIVE NEGATIVE Final    Comment: (NOTE) SARS-CoV-2 target nucleic acids are NOT DETECTED. The SARS-CoV-2 RNA is generally detectable in upper and lower respiratory specimens during the acute phase of infection. Negative results do not preclude SARS-CoV-2 infection, do not rule out co-infections with other pathogens, and should not be used as the sole basis for treatment or other patient management decisions. Negative results must be combined with clinical observations, patient history, and epidemiological information. The expected result is Negative. Fact Sheet for Patients: SugarRoll.be Fact Sheet for Healthcare Providers: https://www.woods-mathews.com/ This test is not yet approved or cleared by the [place] FDA and  has been authorized for detection and/or diagnosis of SARS-CoV-2 by FDA under an Emergency Use Authorization (EUA). This EUA will remain  in effect (meaning this test can be  used) for the duration of the COVID-19 declaration under Section 56 4(b)(1) of the Act, 21 U.S.C. section 360bbb-3(b)(1), unless the authorization is terminated or revoked sooner. Performed at [facility], [place] [address] [place], [place] [address]   Culture, blood (routine x 2)     Status: None (Preliminary result)   Collection Time: 06/03/19  2:12 PM   Specimen: BLOOD  Result Value Ref Range Status   Specimen Description BLOOD RIGHT ANTECUBITAL  Final   Special Requests   Final      BOTTLES DRAWN AEROBIC AND ANAEROBIC Blood Culture adequate volume   Culture   Final    NO GROWTH 2 DAYS Performed at [facility], [place] [address] [place] [address], [place], [place] [address]    Report Status PENDING  Incomplete  Culture, blood (routine x 2)     Status: None (Preliminary result)   Collection Time: 06/03/19  2:18 PM   Specimen: BLOOD RIGHT HAND  Result Value Ref Range Status   Specimen Description BLOOD RIGHT HAND  Final   Special Requests   Final    BOTTLES DRAWN AEROBIC AND ANAEROBIC Blood Culture adequate volume   Culture   Final    NO GROWTH 2 DAYS Performed at [facility], [place] [address] [place], [place] [address]    Report Status PENDING  Incomplete  Culture, Urine     Status: None   Collection Time: 06/03/19  2:30 PM   Specimen: Urine, Clean Catch  Result Value Ref Range Status   Specimen Description URINE, CLEAN CATCH  Final   Special Requests NONE  Final   Culture   Final    NO GROWTH Performed at [facility], [place] [address], [place], [place] [address]    Report Status 06/04/2019 FINAL  Final     Labs: Basic Metabolic Panel: Recent Labs  Lab 06/01/19 [id number] 06/02/19 0538 06/03/19 0635 06/04/19 0515 06/05/19 0343  NA 135 136 133* 135 138  K 4.1 3.9 3.5 3.6 3.6  CL 104 104 102 104 104  CO2 23 22 22  21* 24  GLUCOSE 175* 159* 179* 274* 247*  BUN 18 16 14 15 18   CREATININE 0.99 0.94 0.95 0.87 0.89  CALCIUM 8.0* 8.0* 7.7* 8.1* 8.0*  MG 2.1  --   --   --   --    Liver Function Tests: Recent Labs  Lab 05/30/19 0410 05/31/19 0739 06/01/19 0638 06/03/19 0635  AST 14* 22 35 37  ALT 14 16 25  39  ALKPHOS 57 57 59 75  BILITOT 1.1 1.0 1.0 1.2  PROT 6.4* 6.1* 5.7* 5.4*  ALBUMIN 2.4* 2.1* 1.8* 1.6*   Recent Labs  Lab 06/03/19 0635  LIPASE 38   No results for input(s): AMMONIA in the last 168 hours. CBC: Recent Labs  Lab 06/01/19 0638 06/01/19 1624 06/02/19 0538 06/02/19 1650 06/03/19 0635 06/03/19 1716 06/04/19 0515  06/05/19 0343 06/05/19 1412  WBC 10.4  --  10.6*  --  14.9*  --  15.2* 14.1*  --   HGB 8.3*   < > 8.5*   < > 8.5* 8.1* 7.9* 7.3* 8.8*  HCT 25.8*   < > 26.4*   < > 26.4* 24.6* 24.7* 22.8* 26.9*  MCV 89.6  --  88.0  --  87.4  --  87.3 86.7  --   PLT 329  --  342  --  373  --  356 334  --    < > =  values in this interval not displayed.   Cardiac Enzymes: No results for input(s): CKTOTAL, CKMB, CKMBINDEX, TROPONINI in the last 168 hours. BNP: BNP (last 3 results) No results for input(s): BNP in the last 8760 hours.  ProBNP (last 3 results) No results for input(s): PROBNP in the last 8760 hours.  CBG: Recent Labs  Lab 06/04/19 1158 06/04/19 1710 06/04/19 2015 06/05/19 0727 06/05/19 1208  GLUCAP 343* 354* 281* 214* 198*       Signed:  Amylah Will  [facility] 06/05/2019, 3:00 PM

## 2019-06-05 NOTE — Progress Notes (Signed)
[facility] ROUNDING NOTE   Subjective: No acute events overnight.  Feeling much better today and hopeful for discharge to home.  Tolerating p.o.  No abdominal pain.  Did have episode of epistaxis weight yesterday.  H/H mild downtrend to 7.3/22.8 (from 7.9/24.7 yesterday).  WBC improved to 14.1 (from 15.2).  Anemia work-up ordered.   Objective: Vital signs in last 24 hours: Temp:  [97.4 F (36.3 C)-98.7 F (37.1 C)] 97.8 F (36.6 C) (05/02 0845) Pulse Rate:  [69-77] 75 (05/02 0845) Resp:  [15-20] 16 (05/02 0845) BP: (106-119)/(49-62) 119/62 (05/02 0845) SpO2:  [95 %-99 %] 96 % (05/02 0845) Last BM Date: 06/02/19 General: NAD Abdomen:  Soft, minimal TTP in RUQ, ND, +BS Ext:  No c/c/e    Intake/Output from previous day: 05/01 0701 - 05/02 0700 In: 720 [P.O.:720] Out: 200 [Urine:200] Intake/Output this shift: No intake/output data recorded.   Lab Results: Recent Labs    06/03/19 0635 06/03/19 0635 06/03/19 1716 06/04/19 0515 06/05/19 0343  WBC 14.9*  --   --  15.2* 14.1*  HGB 8.5*   < > 8.1* 7.9* 7.3*  PLT 373  --   --  356 334  MCV 87.4  --   --  87.3 86.7   < > = values in this interval not displayed.   BMET Recent Labs    06/03/19 0635 06/04/19 0515 06/05/19 0343  NA 133* 135 138  K 3.5 3.6 3.6  CL 102 104 104  CO2 22 21* 24  GLUCOSE 179* 274* 247*  BUN 14 15 18   CREATININE 0.95 0.87 0.89  CALCIUM 7.7* 8.1* 8.0*   LFT Recent Labs    06/03/19 0635  PROT 5.4*  ALBUMIN 1.6*  AST 37  ALT 39  ALKPHOS 75  BILITOT 1.2   PT/INR Recent Labs    06/04/19 0515 06/05/19 0343  INR 1.7* 1.7*      Imaging/Other results: CT Angio Abd/Pel w/ and/or w/o  Result Date: 06/04/2019 CLINICAL DATA:  Pancreatitis and pancreatic pseudocyst with evidence internal hemorrhage of a dominant pseudocyst at the level of the tail of the pancreas by prior imaging. EXAM: CT ANGIOGRAPHY ABDOMEN AND PELVIS WITH CONTRAST TECHNIQUE: Multidetector CT imaging  of the abdomen and pelvis was performed using the standard protocol during bolus administration of intravenous contrast. Multiplanar reconstructed images and MIPs were obtained and reviewed to evaluate the vascular anatomy. CONTRAST:  139mL OMNIPAQUE IOHEXOL 350 MG/ML SOLN COMPARISON:  MRI of the abdomen on 05/30/2019 and CT of the abdomen on 05/29/2019 FINDINGS: VASCULAR Aorta: Evidence of prior aortic dissection with visualization of the lower and of an endograft within the true lumen of the distal descending thoracic aorta. This endograft extends to the diaphragmatic hiatus. Maximum diameter of the proximal abdominal aorta is stable and measures 3.2 x 3.4 cm. Dissection extends into the left common iliac artery and terminates before the common iliac bifurcation. Celiac: Celiac axis extends off of the false lumen and is patent. SMA: Dissection partially extends into the origin of the SMA and the SMA is patent with most of the flow appearing to emanate off of the false lumen. Renals: Patent right renal artery extends off the true lumen and patent left renal artery appears to emanate off of the posterior aspect of the aorta partially off of the false lumen and potentially with some contribution from the true lumen. IMA: Patent IMA emanates off of the true lumen. Inflow: Bilateral common, external and internal iliac arteries demonstrate normal patency. Proximal  Outflow: Normally patent bilateral common femoral arteries and femoral bifurcations. Veins: The splenic vein is patent at the level of the splenic hilum. As it is followed, it is compressed by the pseudocyst centered at the tail of the pancreas. Central portion of the splenic vein appears patent. Other venous structures appear normally patent including the portal vein, visualized mesenteric veins, IVC, bilateral renal veins, iliac veins and common femoral veins. Review of the MIP images confirms the above findings. NON-VASCULAR Lower chest: Bibasilar  atelectasis and tiny bilateral pleural effusions. Hepatobiliary: No hepatic lesions or biliary dilatation. Stable gallstones. Pancreas: Head and body of the pancreas demonstrate no necrosis, mass or ductal dilatation. Spleen: Normal in size without focal abnormality. Adrenals/Urinary Tract: Adrenal glands are unremarkable. Kidneys are normal, without renal calculi, focal lesion, or hydronephrosis. Bladder is unremarkable. Stomach/Bowel: Bowel shows no evidence of obstruction, ileus, inflammation, lesion or free air. Lymphatic: No enlarged lymph nodes identified. Reproductive: Prostate is unremarkable. Other: High-density pseudocyst centered around the tail of the pancreas shows stable to slightly smaller size and measures approximately 5.3 x 9.5 cm transversely. Internal density is again consistent with complex fluid and stable between arterial and venous studies with no evidence of contrast extravasation to suggest active bleeding or focal arterial pseudoaneurysm. Enlarging and maturing pseudocyst is identified medial to the lesser curvature of the stomach and invaginating the capsule of the left lobe of the liver measuring approximately 8.9 x 8.6 cm in transverse dimensions. This region of fluid measured approximately 5.5 cm in diameter on the prior study. This fluid collection does not appear hemorrhagic or infected. Adjacent and slightly more inferior collection abutting the liver and gallbladder measures approximately 3.5 x 6.7 cm and previously measured approximately 5 cm in greatest diameter. This is also consistent with evolving pseudocyst and has identical density to the larger adjacent pseudocyst without evidence of hemorrhage or infection. There is some free fluid in the pericolic gutters and pelvis. Musculoskeletal: No acute or significant osseous findings. IMPRESSION: 1. Stable to slightly smaller complex high density pseudocyst centered at the level of the tail of the pancreas. CTA demonstrates no  evidence to suggest active bleeding within this pseudocyst or associated arterial pseudoaneurysm. The pseudocyst does compress the splenic vein without obvious splenic vein thrombosis. 2. Maturing and enlarging adjacent pseudocysts with roughly 9 cm maturing pseudocyst between the stomach and left lobe of the liver and adjacent smaller collection abutting the liver and gallbladder. Both of the pseudocysts demonstrate lower internal fluid density and demonstrate no evidence to suggest internal hemorrhage or infection by CT. Electronically Signed   By: Aletta Edouard M.D.   On: 06/04/2019 09:47      Assessment and Plan:  1) Pancreatic pseudocyst 2) Upper abdominal pain 3) Cholelithiasis without cholecystitis  -CTA completed yesterday: Evidence of prior aortic dissection with endograft extending to the diaphragmatic hiatus.  Dissection extends into the left common iliac and terminates before common iliac bifurcation.  Dissection partially extends into the origin of the SMA and SMA is patent with most flow emanating off false lumen.  Patent celiac, IMA.  Splenic vein patent and compressed by the pseudocyst. -On CTA, pseudocyst measures 5.3 x 9.5 cm (slightly smaller) with complex fluid without contrast extravasation to suggest active bleeding or pseudoaneurysm.  Also enlarging/maturing pseudocyst medial to the lesser curve of stomach enlarging left lobe of liver measuring 8.9 x 8.6 cm (previously 5.5 cm) 1 cm adjacent/inferior collection abutting liver/GB measuring 3.5 x 6.7 cm (previously 5 cm) c/w evolving pseudocyst. -HIDA  scan otherwise negative.   -I discussed the results of the CTA with the patient at length today.  We discussed the possibility for future endoscopic intervention if these pseudocyst do not improve.  Given his complex vascular history through [facility], we discussed referral to the [place] at [facility], which he would like to proceed with. -Will coordinate outpatient  referral to [place] service -Repeat CT in 3-4 weeks, which should be done prior to his appointment with [facility] -He will follow-up in the general surgery service re: ccy  4) Systemic anticoagulation (aortic valve repair) 5) History of aortic dissection -Slight decline in H/H.  Did have episode of epistaxis last evening -Anemia work-up pending -Recommend dose of IV iron -Repeat H/H check 7-10 days after hospital discharge to ensure improvement  6) Leukocytosis -Improving. No fever overnight. -No evidence of infected pseudocyst on CTA  GI service will sign off at this time.  I will coordinate through our clinic a referral to [facility] as outlined above for outpatient evaluation.    Lavena Bullion, DO  06/05/2019, 9:56 AM [facility] Pager [phone]

## 2019-06-06 ENCOUNTER — Telehealth: Payer: Self-pay

## 2019-06-06 ENCOUNTER — Ambulatory Visit: Payer: Self-pay | Admitting: Pharmacist

## 2019-06-06 NOTE — Telephone Encounter (Signed)
Reviewed

## 2019-06-06 NOTE — Telephone Encounter (Signed)
Transition Care Management Follow-up Telephone Call  Date of discharge and from where: 06/05/19 from [facility].  How have you been since you were released from the hospital? Denies pain, nausea, vomiting, headache. Ankles and feet are swollen. Considered taking lasix, but did not due to urinary frequency. INR check in the morning 06/07/19. Overall body weakness.  Eating/drinking appropriate. Plans to start using walker for assistance while ambulating.   Any questions or concerns? Urinary frequency with continued ankle/feet swelling. Leg weakness; PT suspended per patient however would like to resume 06/13/19. Patient will call PT for confirmation if referral is needed; notify PCP.   Items Reviewed:  Did the pt receive and understand the discharge instructions provided? Yes, increase activity as tolerated.   Medications obtained and verified? Yes, plans to not start metformin until discussing with PCP on 5/5. Discharge reads start date 5/4. No further issues, questions or concerns.   Any new allergies since your discharge? None.  Dietary orders reviewed? Yes, bland/soft. No current Ensure Enlive intake.   Do you have support at home? Yes, wife.  Functional Questionnaire: (I = Independent and D = Dependent) ADLs: I  Bathing/Dressing- I  Meal Prep- wife assist, self feeds.  Eating- I  Maintaining continence- I  Transferring/Ambulation- plans to start using walker  Managing Meds- wife assist  Follow up appointments reviewed:   PCP Hospital f/u appt confirmed? Scheduled to see Dr. [name] on 06/08/19 @ 9:30.  [facility] f/u appt confirmed?  Awaiting call back of schedule date with general surgery, [facility].  Are transportation arrangements needed? No.  If their condition worsens, is the pt aware to call PCP or go to the Emergency Dept.? Yes  Was the patient provided with contact information for the PCP's office or ED? Yes  Was to pt encouraged to call back with questions or  concerns? Yes

## 2019-06-06 NOTE — Telephone Encounter (Signed)
-----   Message from Irving Copas., MD sent at 06/05/2019 10:39 AM EDT ----- Tracie Harrier, Seems reasonable based on his desire for all of his care to be coordinated as best possible and as he only trusts the Vascular surgery team at [facility], then he will best be coordinated by the Advanced Endoscopy Team meeting him at [facility] to see what they will want to consider doing.Chong Sicilian, please place a referral to [facility] ([place] ASAP) so that they have an opportunity to review the case, get his imaging and go from there. The referral will come from Dr. Ralene Muskrat. Bryan Lemma. If any issues let me know, and I can help with further coordination with the Advanced Endoscopy Staff liason. Thanks GM ----- Message ----- From: Lavena Bullion, DO Sent: 06/05/2019  10:04 AM EDT To: Timothy Lasso, RN, Irving Copas., MD  I had a long discussion with this patient today regarding his CTA.  Given his prior complex vascular surgical history and postoperative anatomy, he would like a referral to [facility] for discussion of potential cyst drainage since the Vascular Surgery service there is familiar with him in case any co-service coordination is needed.  Sounds reasonable to me.  I explained the plan would be for CT in 3-4 weeks with follow-up in their clinic after that so they can review serial imaging.Anticipate discharge to home today.

## 2019-06-07 ENCOUNTER — Ambulatory Visit (INDEPENDENT_AMBULATORY_CARE_PROVIDER_SITE_OTHER): Payer: Medicare PPO | Admitting: Pharmacist Clinician (PhC)/ Clinical Pharmacy Specialist

## 2019-06-07 DIAGNOSIS — Z952 Presence of prosthetic heart valve: Secondary | ICD-10-CM | POA: Diagnosis not present

## 2019-06-07 DIAGNOSIS — I639 Cerebral infarction, unspecified: Secondary | ICD-10-CM | POA: Diagnosis not present

## 2019-06-07 DIAGNOSIS — Z7901 Long term (current) use of anticoagulants: Secondary | ICD-10-CM | POA: Diagnosis not present

## 2019-06-07 DIAGNOSIS — I359 Nonrheumatic aortic valve disorder, unspecified: Secondary | ICD-10-CM

## 2019-06-07 LAB — POCT INR: INR: 3 (ref 2.0–3.0)

## 2019-06-07 NOTE — Progress Notes (Signed)
Alexander Barton Date of Birth: [birth year] Medical Record #[id number]  History of Present Illness: Alexander Barton is seen today for followup of valvular heart disease. He is status post mechanical aortic valve replacement in October 2008 with a #23 mm ON-X mechanical valve conduit with a button Bentall procedure and Hemi arch graft  and is on chronic Coumadin. He had a TIA in January 2012 and was admitted in May 2014 with a right thalamic CVA. He is now on aspirin and Coumadin.  His evaluation in the hospital included an echocardiogram which showed normal valve function. Carotid Dopplers were OK.  He was admitted in May 2018 at [facility] with worsening dyspnea. Seen by our service there. CTA of the chest demonstrated no evidence of PE or significant parenchymal lung disease. There is evidence of prior mechanical AVR and hemi-arch replacement. Coronary artery calcification is also noted as was cholelithiasis.  Myoview study was done and was normal. Echo also done as noted below.   He was admitted in September 2019 at [facility] with acute epigastric, chest, and back pain. Found to have a type B aortic dissection. Managed medically and followed by Dr. Mart Piggs at [facility].   He had  cardiac evaluation including cardiac cath that showed a 60% stenosis in an OM branch otherwise no obstructive disease. Echo showed normally functioning AV prosthesis and normal LV function. PFTs done with COPD.   On 12/02/18 he underwent redo sternotomy with Dr Ysidro Evert. He had 1. Ascending Aortic and total arch replacement with head vessel re-implantation.2. TEVAR w/ intentional L subclavian artery coverage, L carotid-subclavian bypass, and proximal L subclavian artery vascular plug. The postoperative course was remarkable for postop afib/flutter managed with amiodarone and beta blocker- NSR at time of discharge. Also developed postop delirium after TEVAR requiring psych consult. The patient was discharged to home on  12/13/2018. He was readmitted 11/16-11/20 for UTI, meeting sepsis criteria. He was treated with IV abx and transitioned to PO Cefpodoxime. Amiodarone was d/c'd prior to discharge from readmission.  He was seen by Dr Percival Spanish on 01/06/19 for evaluation of weakness. INR was noted to be high and Coumadin was held for 2 days. He was anemic but really unchanged from his post hospital discharge. It was noted that he had Covid 19 infection in early October and never felt fully recovered from that. He did have a follow up CT chest on November 24 that was stable.   He was recently admitted 4/25-06/05/19 with abdominal pain, N/V. This was felt to be secondary to gallstones causing pancreatitis and pancreatic pseudocyst with intracystic hemorrhage. He was seen by GI and general surgery. Placed on antibiotics. CTA abdomen pelvis showed slightly smaller complex high density pseudocyst.Maturing and enlarging adjacent cirrhosis with roughly 9 cm maturing pseudocyst between the stomach and the left lobe of the liver, adjacent smaller collection abutting the liver and gallbladder. Planned outpatient follow up with surgery and advanced biliary clinic at [facility]. He was anemic with Hgb 8.8. given Feraheme.   On follow up today he states he still has significant fatigue. He has some swelling in his ankles. When seen by Dr [name] last week was noted to have PVCs on his Ecg. These were asymptomatic. He notes he has had some skipped beats for years. He is back on his coumadin. INR 3.0 last week. He is not taking lasix. He was on Toprol XL before but this was stopped due to low BP.   Current Outpatient Medications on File Prior to Visit  Medication Sig Dispense Refill  . acetaminophen (TYLENOL) 500 MG tablet Take 1,000 mg by mouth every 6 (six) hours as needed for mild pain.    Marland Kitchen albuterol (VENTOLIN HFA) 108 (90 Base) MCG/ACT inhaler Inhale 2 puffs into the lungs every 4 (four) hours as needed for wheezing or shortness of breath.  18 g 1  . blood glucose meter kit and supplies KIT Dispense based on patient and insurance preference. Use twice daily. (FOR ICD-10 E11.9). 1 each 0  . budesonide-formoterol (SYMBICORT) 160-4.5 MCG/ACT inhaler TAKE 2 PUFFS BY MOUTH TWICE A DAY (Patient taking differently: Inhale 2 puffs into the lungs in the morning and at bedtime. ) 30.6 Inhaler 4  . cefdinir (OMNICEF) 300 MG capsule Take 1 capsule (300 mg total) by mouth 2 (two) times daily. 10 capsule 0  . clobetasol ointment (TEMOVATE) 1.61 % Apply 1 application topically daily as needed for rash.    . furosemide (LASIX) 20 MG tablet TAKE 1/2 TABLET (10 MG) 1 OR 2 TIMES A DAY AS NEEDED FOR LEG SWELLING (Patient taking differently: Take 10 mg by mouth 2 (two) times daily as needed for fluid or edema. ) 90 tablet 1  . Investigational - Study Medication Take 1 tablet by mouth daily. Study name: [id number] Additional study details: cholesterol [facility] [phone]    . Multiple Vitamin (MULTIVITAMIN WITH MINERALS) TABS tablet Take 1 tablet by mouth daily.    . ondansetron (ZOFRAN) 4 MG tablet Take 1 tablet (4 mg total) by mouth every 6 (six) hours as needed for nausea. 30 tablet 0  . OVER THE COUNTER MEDICATION Take 1 capsule by mouth daily. Dotera Digestive Supplement    . phenylephrine (NEO-SYNEPHRINE) 0.25 % nasal spray Place 2 sprays into both nostrils daily as needed (nose bleeds).    . polyethylene glycol (MIRALAX / GLYCOLAX) 17 g packet Take 17 g by mouth daily.    . predniSONE (DELTASONE) 50 MG tablet Take 1 tablet (50 mg) by mouth 13 hours prior to your scan, then take 1 tablet (50 mg) by mouth 7 hours prior to scan, then take 1 tablet (50 mg) by mouth 1 hour prior to your scan 3 tablet 0  . senna (SENOKOT) 8.6 MG TABS tablet Take 2 tablets by mouth in the morning and at bedtime.    . tamsulosin (FLOMAX) 0.4 MG CAPS capsule TAKE 2 CAPSULES BY MOUTH EVERY DAY (Patient taking differently: Take 0.4 mg by mouth in the morning and at  bedtime. ) 174 capsule 1  . Tiotropium Bromide Monohydrate (SPIRIVA RESPIMAT) 2.5 MCG/ACT AERS INHALE 2.5 MCG INTO THE LUNGS 2 (TWO) TIMES DAILY. (Patient taking differently: Inhale 1 puff into the lungs in the morning and at bedtime. ) 1 Inhaler 5  . traMADol (ULTRAM) 50 MG tablet Take 1 tablet (50 mg total) by mouth every 6 (six) hours as needed. 20 tablet 0  . warfarin (COUMADIN) 5 MG tablet Take 1 tablet (5 mg total) by mouth one time only at 4 PM. 30 tablet 0  . enoxaparin (LOVENOX) 100 MG/ML injection Inject 1 mL (100 mg total) into the skin 2 (two) times daily for 7 days. 14 mL 0   No current facility-administered medications on file prior to visit.    Allergies  Allergen Reactions  . Codeine Anaphylaxis  . Gadolinium Derivatives Other (See Comments)    Other Reaction: intense sneezing MRI contrast  . Hydrocodone Anaphylaxis  . Iodinated Diagnostic Agents Other (See Comments)    Uncontrollable sneezing, needs  sedative the night before and hour and benadryl before using Other Reaction: sneezing with SOB after prep  . Statins Other (See Comments)    Myalgias  . Hydromorphone Other (See Comments)  . Lipitor [Atorvastatin Calcium] Other (See Comments)    Myalgias   . Seroquel [Quetiapine Fumerate] Other (See Comments)    "bad trip" tongue swell    Past Medical History:  Diagnosis Date  . Aortic aneurysm, thoracic ([place])   . Aortic valve disease   . Arthritis   . BPH (benign prostatic hyperplasia)   . COPD (chronic obstructive pulmonary disease) ([place])   . GERD (gastroesophageal reflux disease)   . HTN (hypertension)   . Hypercholesterolemia   . Stroke ([place])   . TIA (transient ischemic attack)     Past Surgical History:  Procedure Laterality Date  . ACHILLES TENDON REPAIR    . AORTIC VALVE REPLACEMENT     #23 On-X valve conduit  . ASCENDING AORTIC ANEURYSM REPAIR    . BACK SURGERY    . FOOT SURGERY      Social History   Tobacco Use  Smoking Status Never Smoker   Smokeless Tobacco Never Used    Social History   Substance and Sexual Activity  Alcohol Use No  . Alcohol/week: 0.0 standard drinks    Family History  Problem Relation Age of Onset  . Alzheimer's disease Father   . Diabetes Father        age onset DM  . Hypertension Sister   . Hypertension Brother   . Diabetes Mother   . Diabetes Sister   . Colon cancer Neg Hx     Review of Systems: As noted in history of present illness.  All other systems were reviewed and are negative.  Physical Exam: BP (!) 112/54 (BP Location: Right Arm, Patient Position: Sitting, Cuff Size: Normal)   Pulse 86   Temp 97.8 F (36.6 C)   Ht '6\' 1"'  (1.854 m)   Wt 202 lb (91.6 kg)   BMI 26.65 kg/m  GENERAL:  Elderly WM appears better than before HEENT:  PERRL, EOMI, sclera are clear. Oropharynx is clear. NECK:  No jugular venous distention, carotid upstroke brisk and symmetric, no bruits, no thyromegaly or adenopathy LUNGS:  Clear to auscultation bilaterally CHEST:  nontender HEART:  RRR,  PMI not displaced or sustained, loud mechanical AV click,  no S3, no S4: no clicks, no rubs, no murmurs ABD:  Soft, nontender. BS +, no masses or bruits. No hepatomegaly, no splenomegaly EXT:  2 + pulses throughout, 1+ edema, no cyanosis no clubbing SKIN:  Warm and dry.  No rashes NEURO:  Alert and oriented x 3. Cranial nerves II through XII intact. PSYCH:  Cognitively intact   LABORATORY DATA:  Lab Results  Component Value Date   WBC 14.4 (H) 06/08/2019   HGB 8.6 Repeated and verified X2. (L) 06/08/2019   HCT 26.2 Repeated and verified X2. (L) 06/08/2019   PLT 362.0 06/08/2019   GLUCOSE 193 (H) 06/08/2019   CHOL 253 (H) 12/10/2016   TRIG 112.0 12/10/2016   HDL 45.00 12/10/2016   LDLDIRECT 172.0 11/14/2014   LDLCALC 185 (H) 12/10/2016   ALT 39 06/03/2019   AST 37 06/03/2019   NA 136 06/08/2019   K 3.6 06/08/2019   CL 98 06/08/2019   CREATININE 0.88 06/08/2019   BUN 9 06/08/2019   CO2 30  06/08/2019   TSH 3.450 01/10/2019   PSA 0.34 11/14/2014   INR 3.0 06/07/2019  HGBA1C 7.3 (H) 05/30/2019     Echo 06/25/16: Study Conclusions  - Procedure narrative: Transthoracic echocardiography. The study   was technically difficult. - Left ventricle: The cavity size was normal. There was mild   concentric hypertrophy. Systolic function was mildly reduced. The   estimated ejection fraction was in the range of 45% to 50%.   Doppler parameters are consistent with abnormal left ventricular   relaxation (grade 1 diastolic dysfunction). - Aortic valve: A mechanical prosthesis was present and functioning   normally. Valve area (Vmax): 1.87 cm^2. - Mitral valve: There was mild regurgitation. - Left atrium: The atrium was moderately dilated. - Right atrium: The atrium was mildly dilated. - Pulmonary arteries: Systolic pressure was within the normal   range.  Myoview 06/25/16: Pharmacological myocardial perfusion imaging study with no significant  ischemia Normal wall motion, EF estimated at 50% No EKG changes concerning for ischemia at peak stress or in recovery. Low risk scan   Signed, Esmond Plants, MD, Ph.D [facility]   Assessment / Plan: 1. Status post mechanical aortic valve replacement with Bentall procedure. On anticoagulation with Coumadin. Echo in July 2020 showed normal valve function. Valve exam is normal. Will check INR tomorrow.  2. Type B Aortic dissection. Diagnosed in September 2019 with subsequent aneurysmal formation. Followed by Dr. Mart Piggs at [facility].  S/p  total arch replacement and TEVAR procedure on December 02, 2018. Follow up CT on November 24 was satisfactory.  3. Hypertension-controlled on no medication.  4. Status post CVA/right thalamic- remote. Continue combined  Coumadin therapy.  5. Hypercholesterolemia. History of intolerance to lipitor and crestor. Was participating in clinical trial thru primary care.  6. COPD 7. Anemia post op secondary to  blood loss.  8. PVCs. These have been noted intermittently in the past and he is asymptomatic. I think it is very unlikely this is causing his fatigue. We did stop Toprol in the past due to orthostasis.  9. S/p Covid infection at the time of his operation.  10. Hemorrhagic pancreatic pseudocysts. Clinically improved. He is to follow up with GI at [facility]. He does have some ankle edema related to volume load during recent hospital stay. I instructed him to take 1-2 lasix but this should improve.   Follow up in 6 months.

## 2019-06-07 NOTE — Telephone Encounter (Signed)
Referral has been made to [facility].  Records faxed as URGENT to [place], [place], [place], or [place].

## 2019-06-08 ENCOUNTER — Ambulatory Visit: Payer: Medicare PPO | Admitting: Family Medicine

## 2019-06-08 ENCOUNTER — Encounter: Payer: Self-pay | Admitting: Family Medicine

## 2019-06-08 ENCOUNTER — Other Ambulatory Visit: Payer: Self-pay

## 2019-06-08 VITALS — BP 130/70 | HR 88 | Temp 97.8°F | Ht 73.0 in | Wt 210.8 lb

## 2019-06-08 DIAGNOSIS — Z8673 Personal history of transient ischemic attack (TIA), and cerebral infarction without residual deficits: Secondary | ICD-10-CM | POA: Diagnosis not present

## 2019-06-08 DIAGNOSIS — E119 Type 2 diabetes mellitus without complications: Secondary | ICD-10-CM

## 2019-06-08 DIAGNOSIS — I493 Ventricular premature depolarization: Secondary | ICD-10-CM | POA: Diagnosis not present

## 2019-06-08 DIAGNOSIS — I499 Cardiac arrhythmia, unspecified: Secondary | ICD-10-CM | POA: Diagnosis not present

## 2019-06-08 DIAGNOSIS — D649 Anemia, unspecified: Secondary | ICD-10-CM | POA: Diagnosis not present

## 2019-06-08 DIAGNOSIS — K863 Pseudocyst of pancreas: Secondary | ICD-10-CM

## 2019-06-08 LAB — CBC
HCT: 26.2 % — ABNORMAL LOW (ref 39.0–52.0)
Hemoglobin: 8.6 g/dL — ABNORMAL LOW (ref 13.0–17.0)
MCHC: 33 g/dL (ref 30.0–36.0)
MCV: 87.1 fl (ref 78.0–100.0)
Platelets: 362 10*3/uL (ref 150.0–400.0)
RBC: 3.01 Mil/uL — ABNORMAL LOW (ref 4.22–5.81)
RDW: 16 % — ABNORMAL HIGH (ref 11.5–15.5)
WBC: 14.4 10*3/uL — ABNORMAL HIGH (ref 4.0–10.5)

## 2019-06-08 LAB — BASIC METABOLIC PANEL
BUN: 9 mg/dL (ref 6–23)
CO2: 30 mEq/L (ref 19–32)
Calcium: 8.1 mg/dL — ABNORMAL LOW (ref 8.4–10.5)
Chloride: 98 mEq/L (ref 96–112)
Creatinine, Ser: 0.88 mg/dL (ref 0.40–1.50)
GFR: 85.44 mL/min (ref >60.00)
Glucose, Bld: 193 mg/dL — ABNORMAL HIGH (ref 70–99)
Potassium: 3.6 mEq/L (ref 3.5–5.1)
Sodium: 136 mEq/L (ref 135–145)

## 2019-06-08 LAB — CULTURE, BLOOD (ROUTINE X 2)
Culture: NO GROWTH
Culture: NO GROWTH
Special Requests: ADEQUATE
Special Requests: ADEQUATE

## 2019-06-08 MED ORDER — BLOOD GLUCOSE MONITOR KIT: PACK | 0 refills | Status: AC

## 2019-06-08 NOTE — Patient Instructions (Signed)
Nice to see you.  We will check labs today and call with the results. Depending on the results we may consider adding a beta blocker to help with symptoms from PVCs.

## 2019-06-08 NOTE — Progress Notes (Signed)
[name], MD Phone: [phone]  Alexander Barton is a 69 y.o. male who presents today for follow-up.  Patient was hospitalized from 05/29/2019-06/05/2019.  He was hospitalized for intractable abdominal pain.  He was found to have a complex hemorrhagic cyst of his pancreas as well as cholelithiasis with gallbladder sludge and supratherapeutic INR.  Patient was having worsening and persistent abdominal pain with nausea and vomiting over several weeks.  Pain was located mostly in his epigastric region but had become generalized in his upper abdomen.  It was felt that his abdominal pain was likely secondary to gallstones causing pancreatitis and pancreatic pseudocyst with intracystic hemorrhage.  He was evaluated by general surgery as well as GI.  He is being referred to the advanced biliary GI service at [facility] and is awaiting a call regarding that.  He did have a HIDA scan that was normal.  New onset diabetes also diagnosed.  A1c 7.3.  They wanted him to take Metformin though the patient declines starting on this at this time and would like to use essential oils with lemon and cinnamon oils.  The patient notes his abdomen overall feels better.  It feels less distended and swollen.  No abdominal pain.  No shortness of breath.  No blood in his stool.  No vomiting or diarrhea.  No nausea now.  He overall feels quite weak.  He is no longer on aspirin.  He does report a history of A. fib after having surgery previously though also reports a history of skipped beats.  He apparently had intermittent fevers in the hospital as well though none over the last 48 hours prior to discharge.  No obvious sources of infection and they wondered if this was related to his pancreatic process.  Discharge summary reviewed.  Medications reviewed.  Social History   Tobacco Use  Smoking Status Never Smoker  Smokeless Tobacco Never Used     ROS see history of present illness  Objective  Physical Exam Vitals:    06/08/19 0948  BP: 130/70  Pulse: 88  Temp: 97.8 F (36.6 C)  SpO2: 98%    BP Readings from Last 3 Encounters:  06/08/19 130/70  06/05/19 115/62  05/25/19 (!) 121/58   Wt Readings from Last 3 Encounters:  06/08/19 210 lb 12.8 oz (95.6 kg)  05/30/19 215 lb 2.7 oz (97.6 kg)  05/25/19 211 lb (95.7 kg)    Physical Exam Constitutional:      General: He is not in acute distress.    Appearance: He is not diaphoretic.  Cardiovascular:     Rate and Rhythm: Normal rate.     Heart sounds: Normal heart sounds.     Comments: Periods of regular rhythm with extra beats noted on exam Pulmonary:     Effort: Pulmonary effort is normal.     Breath sounds: Normal breath sounds.  Abdominal:     General: Bowel sounds are normal. There is no distension.     Palpations: Abdomen is soft.     Tenderness: There is no abdominal tenderness. There is no guarding or rebound.  Musculoskeletal:     Right lower leg: No edema.     Left lower leg: No edema.  Skin:    General: Skin is warm and dry.  Neurological:     Mental Status: He is alert.    EKG: Sinus rhythm with sinus arrhythmia with frequent PVCs, no apparent ST or T wave abnormalities, rate 86  Assessment/Plan: Please see individual problem list.  Pancreatic pseudocyst Patient with pancreatic pseudocyst likely related to a prior episode of pancreatitis.  This and his gallstones are likely contributing to his abdominal pain.  Pain has improved at this point.  He overall does feel weak which could be from his 8-day hospital stay though could be from the overall disease process.  We will check labs as outlined below.  He will follow up with GI and general surgery.  If he does not hear from GI at [facility] within the next week or so he will let us know.  Discussed reasons to go back to the emergency room.  PVC's (premature ventricular contractions) This could be contributing to his weakness if they are occurring frequently.  Advised I would send a  message to his cardiologist regarding possible treatment with a beta-blocker and this was done.  Patient does see the cardiologist next week.  History of stroke Discussed that currently he would remain off of aspirin given that he has a hemorrhagic pancreatic pseudocyst.  Discussed at some point he may be able to go back on aspirin.  Anemia Recheck blood counts.  Type 2 diabetes mellitus ([place]) Discussed that I would not recommend using oils as we do not know how those would interfere with his INR.  Discussed that if he did use oils he would have to let his cardiology team know as they are tracking his INR.  The patient does not want to go on medication at this time and we can plan to readdress this in 3 months.   Orders Placed This Encounter  Procedures  . Basic Metabolic Panel (BMET)  . CBC  . EKG 12-Lead    Meds ordered this encounter  Medications  . blood glucose meter kit and supplies KIT    Sig: Dispense based on patient and insurance preference. Use twice daily. (FOR ICD-10 E11.9).    Dispense:  1 each    Refill:  0    Order Specific Question:   Number of strips    Answer:   100    Order Specific Question:   Number of lancets    Answer:   100    This visit occurred during the SARS-CoV-2 public health emergency.  Safety protocols were in place, including screening questions prior to the visit, additional usage of staff PPE, and extensive cleaning of exam room while observing appropriate contact time as indicated for disinfecting solutions.    [name], MD [place]

## 2019-06-09 ENCOUNTER — Other Ambulatory Visit: Payer: Self-pay | Admitting: Family Medicine

## 2019-06-09 DIAGNOSIS — D649 Anemia, unspecified: Secondary | ICD-10-CM

## 2019-06-09 DIAGNOSIS — I493 Ventricular premature depolarization: Secondary | ICD-10-CM | POA: Insufficient documentation

## 2019-06-09 NOTE — Assessment & Plan Note (Signed)
Discussed that I would not recommend using oils as we do not know how those would interfere with his INR.  Discussed that if he did use oils he would have to let his cardiology team know as they are tracking his INR.  The patient does not want to go on medication at this time and we can plan to readdress this in 3 months.

## 2019-06-09 NOTE — Assessment & Plan Note (Signed)
This could be contributing to his weakness if they are occurring frequently.  Advised I would send a message to his cardiologist regarding possible treatment with a beta-blocker and this was done.  Patient does see the cardiologist next week.

## 2019-06-09 NOTE — Assessment & Plan Note (Signed)
Discussed that currently he would remain off of aspirin given that he has a hemorrhagic pancreatic pseudocyst.  Discussed at some point he may be able to go back on aspirin.

## 2019-06-09 NOTE — Assessment & Plan Note (Signed)
Recheck blood counts.

## 2019-06-09 NOTE — Assessment & Plan Note (Signed)
Patient with pancreatic pseudocyst likely related to a prior episode of pancreatitis.  This and his gallstones are likely contributing to his abdominal pain.  Pain has improved at this point.  He overall does feel weak which could be from his 8-day hospital stay though could be from the overall disease process.  We will check labs as outlined below.  He will follow up with GI and general surgery.  If he does not hear from GI at [facility] within the next week or so he will let us know.  Discussed reasons to go back to the emergency room.

## 2019-06-13 ENCOUNTER — Other Ambulatory Visit: Payer: Self-pay

## 2019-06-13 ENCOUNTER — Ambulatory Visit: Payer: Medicare PPO | Admitting: Cardiology

## 2019-06-13 ENCOUNTER — Encounter: Payer: Self-pay | Admitting: Cardiology

## 2019-06-13 VITALS — BP 112/54 | HR 86 | Temp 97.8°F | Ht 73.0 in | Wt 202.0 lb

## 2019-06-13 DIAGNOSIS — I493 Ventricular premature depolarization: Secondary | ICD-10-CM | POA: Diagnosis not present

## 2019-06-13 DIAGNOSIS — Z952 Presence of prosthetic heart valve: Secondary | ICD-10-CM | POA: Diagnosis not present

## 2019-06-13 DIAGNOSIS — I359 Nonrheumatic aortic valve disorder, unspecified: Secondary | ICD-10-CM | POA: Diagnosis not present

## 2019-06-13 DIAGNOSIS — D62 Acute posthemorrhagic anemia: Secondary | ICD-10-CM

## 2019-06-13 DIAGNOSIS — I7103 Dissection of thoracoabdominal aorta: Secondary | ICD-10-CM

## 2019-06-13 DIAGNOSIS — I10 Essential (primary) hypertension: Secondary | ICD-10-CM | POA: Diagnosis not present

## 2019-06-14 ENCOUNTER — Telehealth: Payer: Self-pay | Admitting: Cardiology

## 2019-06-14 ENCOUNTER — Other Ambulatory Visit: Payer: Self-pay

## 2019-06-14 ENCOUNTER — Other Ambulatory Visit (INDEPENDENT_AMBULATORY_CARE_PROVIDER_SITE_OTHER): Payer: Medicare PPO

## 2019-06-14 ENCOUNTER — Ambulatory Visit (INDEPENDENT_AMBULATORY_CARE_PROVIDER_SITE_OTHER): Payer: Medicare PPO | Admitting: Pharmacist Clinician (PhC)/ Clinical Pharmacy Specialist

## 2019-06-14 DIAGNOSIS — D649 Anemia, unspecified: Secondary | ICD-10-CM

## 2019-06-14 DIAGNOSIS — Z952 Presence of prosthetic heart valve: Secondary | ICD-10-CM | POA: Diagnosis not present

## 2019-06-14 DIAGNOSIS — Z7901 Long term (current) use of anticoagulants: Secondary | ICD-10-CM

## 2019-06-14 DIAGNOSIS — I359 Nonrheumatic aortic valve disorder, unspecified: Secondary | ICD-10-CM

## 2019-06-14 LAB — CBC WITH DIFFERENTIAL/PLATELET
Basophils Absolute: 0.1 10*3/uL (ref 0.0–0.1)
Basophils Relative: 1.4 % (ref 0.0–3.0)
Eosinophils Absolute: 0.1 10*3/uL (ref 0.0–0.7)
Eosinophils Relative: 2.1 % (ref 0.0–5.0)
HCT: 27.9 % — ABNORMAL LOW (ref 39.0–52.0)
Hemoglobin: 9.2 g/dL — ABNORMAL LOW (ref 13.0–17.0)
Lymphocytes Relative: 27.8 % (ref 12.0–46.0)
Lymphs Abs: 1.5 10*3/uL (ref 0.7–4.0)
MCHC: 32.8 g/dL (ref 30.0–36.0)
MCV: 88.1 fl (ref 78.0–100.0)
Monocytes Absolute: 0.4 10*3/uL (ref 0.1–1.0)
Monocytes Relative: 7.5 % (ref 3.0–12.0)
Neutro Abs: 3.3 10*3/uL (ref 1.4–7.7)
Neutrophils Relative %: 61.2 % (ref 43.0–77.0)
Platelets: 343 10*3/uL (ref 150.0–400.0)
RBC: 3.17 Mil/uL — ABNORMAL LOW (ref 4.22–5.81)
RDW: 17.6 % — ABNORMAL HIGH (ref 11.5–15.5)
WBC: 5.4 10*3/uL (ref 4.0–10.5)

## 2019-06-14 LAB — POCT INR: INR: 5.8 — AB (ref 2.0–3.0)

## 2019-06-14 NOTE — Telephone Encounter (Signed)
Received a call from [employer] calling to report a critical INR 5.8.Message sent to Coumadin clinic.

## 2019-06-14 NOTE — Telephone Encounter (Signed)
Thank you for the update.  Please see anti-coagulation note for details

## 2019-06-15 LAB — PTH, INTACT AND CALCIUM
Calcium: 8.4 mg/dL — ABNORMAL LOW (ref 8.6–10.3)
PTH: 9 pg/mL — ABNORMAL LOW (ref 14–64)

## 2019-06-16 ENCOUNTER — Telehealth: Payer: Self-pay

## 2019-06-16 ENCOUNTER — Other Ambulatory Visit: Payer: Self-pay | Admitting: Family Medicine

## 2019-06-16 DIAGNOSIS — E200 Idiopathic hypoparathyroidism: Secondary | ICD-10-CM

## 2019-06-16 MED ORDER — CALCITRIOL 0.25 MCG PO CAPS: 0.2500 ug | ORAL_CAPSULE | Freq: Every day | ORAL | 1 refills | Status: DC

## 2019-06-16 NOTE — Telephone Encounter (Signed)
LMTCB to schedule lab appointment in 2-3 weeks. Patient also advised that script was sent & referral placed.

## 2019-06-17 ENCOUNTER — Ambulatory Visit (INDEPENDENT_AMBULATORY_CARE_PROVIDER_SITE_OTHER): Payer: Medicare PPO | Admitting: Pharmacist Clinician (PhC)/ Clinical Pharmacy Specialist

## 2019-06-17 DIAGNOSIS — Z952 Presence of prosthetic heart valve: Secondary | ICD-10-CM

## 2019-06-17 DIAGNOSIS — I359 Nonrheumatic aortic valve disorder, unspecified: Secondary | ICD-10-CM

## 2019-06-17 DIAGNOSIS — Z7901 Long term (current) use of anticoagulants: Secondary | ICD-10-CM | POA: Diagnosis not present

## 2019-06-17 LAB — POCT INR: INR: 2.4 (ref 2.0–3.0)

## 2019-06-25 ENCOUNTER — Other Ambulatory Visit: Payer: Self-pay | Admitting: Internal Medicine

## 2019-06-25 DIAGNOSIS — J449 Chronic obstructive pulmonary disease, unspecified: Secondary | ICD-10-CM

## 2019-06-27 DIAGNOSIS — K8591 Acute pancreatitis with uninfected necrosis, unspecified: Secondary | ICD-10-CM | POA: Diagnosis not present

## 2019-06-27 DIAGNOSIS — K8020 Calculus of gallbladder without cholecystitis without obstruction: Secondary | ICD-10-CM | POA: Diagnosis not present

## 2019-06-27 DIAGNOSIS — I7102 Dissection of abdominal aorta: Secondary | ICD-10-CM | POA: Diagnosis not present

## 2019-06-27 DIAGNOSIS — I7100 Dissection of unspecified site of aorta: Secondary | ICD-10-CM | POA: Diagnosis not present

## 2019-06-29 ENCOUNTER — Ambulatory Visit: Payer: Medicare PPO | Admitting: Family Medicine

## 2019-06-29 ENCOUNTER — Encounter: Payer: Self-pay | Admitting: Family Medicine

## 2019-06-29 ENCOUNTER — Other Ambulatory Visit: Payer: Self-pay

## 2019-06-29 VITALS — BP 110/70 | HR 94 | Temp 96.4°F | Ht 73.0 in | Wt 195.0 lb

## 2019-06-29 DIAGNOSIS — J449 Chronic obstructive pulmonary disease, unspecified: Secondary | ICD-10-CM

## 2019-06-29 DIAGNOSIS — E200 Idiopathic hypoparathyroidism: Secondary | ICD-10-CM | POA: Diagnosis not present

## 2019-06-29 DIAGNOSIS — K863 Pseudocyst of pancreas: Secondary | ICD-10-CM

## 2019-06-29 DIAGNOSIS — R5381 Other malaise: Secondary | ICD-10-CM | POA: Diagnosis not present

## 2019-06-29 DIAGNOSIS — R5382 Chronic fatigue, unspecified: Secondary | ICD-10-CM | POA: Diagnosis not present

## 2019-06-29 LAB — CBC WITH DIFFERENTIAL/PLATELET
Basophils Absolute: 0 10*3/uL (ref 0.0–0.1)
Basophils Relative: 0.8 % (ref 0.0–3.0)
Eosinophils Absolute: 0.3 10*3/uL (ref 0.0–0.7)
Eosinophils Relative: 5.3 % — ABNORMAL HIGH (ref 0.0–5.0)
HCT: 32.3 % — ABNORMAL LOW (ref 39.0–52.0)
Hemoglobin: 10.7 g/dL — ABNORMAL LOW (ref 13.0–17.0)
Lymphocytes Relative: 29.9 % (ref 12.0–46.0)
Lymphs Abs: 1.5 10*3/uL (ref 0.7–4.0)
MCHC: 33.2 g/dL (ref 30.0–36.0)
MCV: 88 fl (ref 78.0–100.0)
Monocytes Absolute: 0.5 10*3/uL (ref 0.1–1.0)
Monocytes Relative: 10 % (ref 3.0–12.0)
Neutro Abs: 2.8 10*3/uL (ref 1.4–7.7)
Neutrophils Relative %: 54 % (ref 43.0–77.0)
Platelets: 244 10*3/uL (ref 150.0–400.0)
RBC: 3.67 Mil/uL — ABNORMAL LOW (ref 4.22–5.81)
RDW: 18.1 % — ABNORMAL HIGH (ref 11.5–15.5)
WBC: 5.1 10*3/uL (ref 4.0–10.5)

## 2019-06-29 LAB — MAGNESIUM: Magnesium: 2 mg/dL (ref 1.5–2.5)

## 2019-06-29 LAB — COMPREHENSIVE METABOLIC PANEL
ALT: 10 U/L (ref 0–53)
AST: 15 U/L (ref 0–37)
Albumin: 4 g/dL (ref 3.5–5.2)
Alkaline Phosphatase: 96 U/L (ref 39–117)
BUN: 19 mg/dL (ref 6–23)
CO2: 28 mEq/L (ref 19–32)
Calcium: 9.7 mg/dL (ref 8.4–10.5)
Chloride: 101 mEq/L (ref 96–112)
Creatinine, Ser: 1.14 mg/dL (ref 0.40–1.50)
GFR: 63.37 mL/min (ref >60.00)
Glucose, Bld: 150 mg/dL — ABNORMAL HIGH (ref 70–99)
Potassium: 4.3 mEq/L (ref 3.5–5.1)
Sodium: 137 mEq/L (ref 135–145)
Total Bilirubin: 0.7 mg/dL (ref 0.2–1.2)
Total Protein: 7.2 g/dL (ref 6.0–8.3)

## 2019-06-29 LAB — PHOSPHORUS: Phosphorus: 3.2 mg/dL (ref 2.3–4.6)

## 2019-06-29 LAB — TESTOSTERONE: Testosterone: 359.8 ng/dL (ref 300.00–890.00)

## 2019-06-29 LAB — TSH: TSH: 1.88 u[IU]/mL (ref 0.35–4.50)

## 2019-06-29 LAB — VITAMIN D 25 HYDROXY (VIT D DEFICIENCY, FRACTURES): VITD: 36.43 ng/mL (ref 30.00–100.00)

## 2019-06-29 NOTE — Assessment & Plan Note (Addendum)
Chronic issue.  He will complete evaluation through GI.  I suspect the sweats he is having is related to this.  He will monitor those.  I encouraged him to check his temperature when he is having the sweats.  I also suspect this issue is contributing to his fatigue.

## 2019-06-29 NOTE — Assessment & Plan Note (Signed)
Check follow-up labs. 

## 2019-06-29 NOTE — Progress Notes (Signed)
[name], MD Phone: [phone]  Alexander Barton is a 69 y.o. male who presents today for follow-up.  Pancreatitis/pancreatic pseudocyst: Hospitalized for this previously.  He has followed up with GI at [facility] and they are repeating a CT scan on June 7.  He notes his abdominal discomfort is improved significantly.  He has not had any fevers though has had some night sweats since just prior to going into the hospital when he developed the abdominal issues previously.  He discussed this with the GI physician and notes they felt as though this was related to his pancreatic issue.  He continues to feel fatigued and having lack of energy.  He has gone and walk 12 laps around the basketball court on one occasion.  He does note may be having mild depression though no SI.  COPD: Taking Symbicort and Spiriva.  No dyspnea, cough, or wheezing.  Hypoparathyroidism: Diagnosed on lab work.  He needs follow-up labs for this.  Social History   Tobacco Use  Smoking Status Never Smoker  Smokeless Tobacco Never Used     ROS see history of present illness  Objective  Physical Exam Vitals:   06/29/19 0940  BP: 110/70  Pulse: 94  Temp: (!) 96.4 F (35.8 C)  SpO2: 97%    BP Readings from Last 3 Encounters:  06/29/19 110/70  06/13/19 (!) 112/54  06/08/19 130/70   Wt Readings from Last 3 Encounters:  06/29/19 195 lb (88.5 kg)  06/13/19 202 lb (91.6 kg)  06/08/19 210 lb 12.8 oz (95.6 kg)    Physical Exam Constitutional:      General: He is not in acute distress.    Appearance: He is not diaphoretic.  Cardiovascular:     Rate and Rhythm: Normal rate and regular rhythm.     Heart sounds: Normal heart sounds.  Pulmonary:     Effort: Pulmonary effort is normal.     Breath sounds: Normal breath sounds.  Abdominal:     General: Bowel sounds are normal. There is no distension.     Palpations: Abdomen is soft.     Tenderness: There is no abdominal tenderness. There is no guarding  or rebound.  Skin:    General: Skin is warm and dry.  Neurological:     Mental Status: He is alert.      Assessment/Plan: Please see individual problem list.  Pancreatic pseudocyst Chronic issue.  He will complete evaluation through GI.  I suspect the sweats he is having is related to this.  He will monitor those.  I encouraged him to check his temperature when he is having the sweats.  I also suspect this issue is contributing to his fatigue.  Physical deconditioning I suspect the patient is deconditioned.  He has had multiple hospitalizations in the last 6 or so months and has not really been able to recover.  Currently dealing with a pancreatic pseudocyst.  We will check additional labs to evaluate for other causes of fatigue.  Idiopathic hypoparathyroidism ([place]) Check follow-up labs.  Chronic obstructive pulmonary disease (HCC) Chronic issue.  Currently asymptomatic.  He will continue his Symbicort and Spiriva.    Orders Placed This Encounter  Procedures  . Comp Met (CMET)  . Vitamin D (25 hydroxy)  . Phosphorus  . Magnesium  . Testosterone  . CBC w/Diff  . TSH    No orders of the defined types were placed in this encounter.   This visit occurred during the SARS-CoV-2 public health emergency.  Safety protocols were in place, including screening questions prior to the visit, additional usage of staff PPE, and extensive cleaning of exam room while observing appropriate contact time as indicated for disinfecting solutions.    [name], MD [place]

## 2019-06-29 NOTE — Assessment & Plan Note (Signed)
I suspect the patient is deconditioned.  He has had multiple hospitalizations in the last 6 or so months and has not really been able to recover.  Currently dealing with a pancreatic pseudocyst.  We will check additional labs to evaluate for other causes of fatigue.

## 2019-06-29 NOTE — Patient Instructions (Signed)
Nice to see you. We will get lab work today and contact you with the results. If you develop abdominal pain or fevers please go to the emergency room.

## 2019-06-29 NOTE — Assessment & Plan Note (Signed)
Chronic issue.  Currently asymptomatic.  He will continue his Symbicort and Spiriva.

## 2019-06-30 ENCOUNTER — Telehealth: Payer: Self-pay

## 2019-06-30 ENCOUNTER — Ambulatory Visit (INDEPENDENT_AMBULATORY_CARE_PROVIDER_SITE_OTHER): Payer: Medicare PPO | Admitting: Cardiology

## 2019-06-30 DIAGNOSIS — Z7901 Long term (current) use of anticoagulants: Secondary | ICD-10-CM | POA: Diagnosis not present

## 2019-06-30 DIAGNOSIS — I359 Nonrheumatic aortic valve disorder, unspecified: Secondary | ICD-10-CM | POA: Diagnosis not present

## 2019-06-30 LAB — POCT INR: INR: 2.2 (ref 2.0–3.0)

## 2019-06-30 NOTE — Telephone Encounter (Signed)
-----   Message from Leone Haven, MD sent at 06/30/2019  2:34 PM EDT ----- Please let the patient know that his calcium is now normal. I would like to recheck than in 2-3 weeks. His hemoglobin has improved some, though he does still remain anemic. His glucose was elevated and we can check an A1c with his next labs. His other labs are acceptable. Thanks.

## 2019-07-05 ENCOUNTER — Other Ambulatory Visit: Payer: Self-pay | Admitting: Cardiology

## 2019-07-07 DIAGNOSIS — R040 Epistaxis: Secondary | ICD-10-CM | POA: Diagnosis not present

## 2019-07-11 DIAGNOSIS — K8591 Acute pancreatitis with uninfected necrosis, unspecified: Secondary | ICD-10-CM | POA: Diagnosis not present

## 2019-07-11 DIAGNOSIS — E200 Idiopathic hypoparathyroidism: Secondary | ICD-10-CM | POA: Diagnosis not present

## 2019-07-12 ENCOUNTER — Ambulatory Visit (INDEPENDENT_AMBULATORY_CARE_PROVIDER_SITE_OTHER): Payer: Medicare PPO | Admitting: Pharmacist Clinician (PhC)/ Clinical Pharmacy Specialist

## 2019-07-12 DIAGNOSIS — Z952 Presence of prosthetic heart valve: Secondary | ICD-10-CM

## 2019-07-12 DIAGNOSIS — I359 Nonrheumatic aortic valve disorder, unspecified: Secondary | ICD-10-CM

## 2019-07-12 DIAGNOSIS — Z8673 Personal history of transient ischemic attack (TIA), and cerebral infarction without residual deficits: Secondary | ICD-10-CM | POA: Diagnosis not present

## 2019-07-12 DIAGNOSIS — K8511 Biliary acute pancreatitis with uninfected necrosis: Secondary | ICD-10-CM | POA: Diagnosis not present

## 2019-07-12 DIAGNOSIS — Z7901 Long term (current) use of anticoagulants: Secondary | ICD-10-CM

## 2019-07-12 DIAGNOSIS — K863 Pseudocyst of pancreas: Secondary | ICD-10-CM | POA: Diagnosis not present

## 2019-07-12 LAB — POCT INR: INR: 2.4 (ref 2.0–3.0)

## 2019-07-15 ENCOUNTER — Telehealth: Payer: Self-pay | Admitting: *Deleted

## 2019-07-15 NOTE — Telephone Encounter (Signed)
Agree Bridging Lovenox would be ideal.  Judine Arciniega [name] MD, [facility]

## 2019-07-15 NOTE — Telephone Encounter (Signed)
Patient takes warfarin for ON-X aortic valve replacement, also has history of TIA in 2012 and CVA in 2014. Goal INR 2-2.5 per [facility] cardiology.  Procedure: upper EUS Date of procedure: 07/27/19  CrCl 76mL/min Platelet count 244K  ON-X valve has lower clot risk than other mechanical AVRs, however with patient's history of TIA and CVA, anticipate that pt will require bridging with Lovenox. Will forward to MD for input on need for Lovenox bridging.

## 2019-07-15 NOTE — Telephone Encounter (Signed)
   [facility] Pre-operative Risk Assessment    Request for surgical clearance:  1. What type of surgery is being performed? UPPER EUS   2. When is this surgery scheduled? 07/27/2019    3. What type of clearance is required (medical clearance vs. Pharmacy clearance to hold med vs. Both)? MEDS  4. Are there any medications that need to be held prior to surgery and how long?WARFARIN  5 DAYS   5. Practice name and name of physician performing surgery? DR [facility]    6. What is the office phone number?    7.   What is the office fax number? [phone]  8.   Anesthesia type (None, local, MAC, general) ? UNKNOWN

## 2019-07-18 NOTE — Telephone Encounter (Signed)
Patient will repeat INR on 6/16, then call for bridging instructions. Need copy of bridging instructions via myCart as well.

## 2019-07-19 NOTE — Telephone Encounter (Signed)
   Primary Cardiologist: [name], MD  Chart reviewed as part of pre-operative protocol coverage. Given past medical history and time since last visit, based on ACC/AHA guidelines, Alexander Barton would be at acceptable risk for the planned procedure without further cardiovascular testing.   Patient takes warfarin for ON-X aortic valve replacement, also has history of TIA in 2012 and CVA in 2014. Goal INR 2-2.5 per [facility] cardiology.  Procedure: upper EUS Date of procedure: 07/27/19  CrCl 73mL/min Platelet count 244K  ON-X valve has lower clot risk than other mechanical AVRs, however with patient's history of TIA and CVA, pt will require bridging with Lovenox.   He is working with [facility] clinical pharmacist for his Lovenox bridging.  I will route this recommendation to the requesting party via Epic fax function and remove from pre-op pool.  Please call with questions.  [name]. [name] NP-C    07/19/2019, 8:04 AM [place] [address] Office [phone] Fax [phone]

## 2019-07-20 ENCOUNTER — Other Ambulatory Visit: Payer: Self-pay

## 2019-07-20 ENCOUNTER — Telehealth: Payer: Self-pay | Admitting: Pharmacist

## 2019-07-20 ENCOUNTER — Telehealth: Payer: Self-pay

## 2019-07-20 ENCOUNTER — Telehealth: Payer: Self-pay | Admitting: Cardiology

## 2019-07-20 ENCOUNTER — Ambulatory Visit (INDEPENDENT_AMBULATORY_CARE_PROVIDER_SITE_OTHER): Payer: Medicare PPO | Admitting: Cardiovascular Disease

## 2019-07-20 DIAGNOSIS — E119 Type 2 diabetes mellitus without complications: Secondary | ICD-10-CM

## 2019-07-20 DIAGNOSIS — I359 Nonrheumatic aortic valve disorder, unspecified: Secondary | ICD-10-CM | POA: Diagnosis not present

## 2019-07-20 DIAGNOSIS — Z7901 Long term (current) use of anticoagulants: Secondary | ICD-10-CM

## 2019-07-20 LAB — POCT INR: INR: 1.4 — AB (ref 2.0–3.0)

## 2019-07-20 MED ORDER — ENOXAPARIN SODIUM 80 MG/0.8ML ~~LOC~~ SOLN: 80.0000 mg | Freq: Two times a day (BID) | SUBCUTANEOUS | 3 refills | Status: DC

## 2019-07-20 MED ORDER — GLUCOSE BLOOD VI STRP: ORAL_STRIP | 12 refills | Status: DC

## 2019-07-20 MED ORDER — GLUCOSE BLOOD VI STRP: ORAL_STRIP | 1 refills | Status: AC

## 2019-07-20 NOTE — Telephone Encounter (Signed)
Message sent to Coumadin clinic for advice.

## 2019-07-20 NOTE — Telephone Encounter (Signed)
Spoke with patient and wife regarding Lovenox bridging instructions.  Patient reported that his procedure is scheduled for 6/22, however he has another procedure scheduled for 6/29.  Both procedures scheduled with [facility] with Dr. Cephas Darby.  Advised patient to contact surgeon's office to discuss second half of bridgin schedule with Dr. Juleen China office in case he did not want patient to restart on warfarin between 6/22 and 6/29 and remain solely on Lovenox.  Patient's wife voiced understanding

## 2019-07-20 NOTE — Telephone Encounter (Signed)
Patient has INR result from 6/16 of 1.4  Will route to Dr. [name] and RN to advise

## 2019-07-20 NOTE — Patient Instructions (Signed)
Description   Follow instructions given on your Lovenox bridging sheet

## 2019-07-20 NOTE — Telephone Encounter (Signed)
Anticoagulation encounter made waiting for a pharmacist to return the call to the patient for dosing

## 2019-07-20 NOTE — Telephone Encounter (Signed)
[facility] called to report an out of range INR for the patient, but hung up before I was able to get a hold of someone to transfer the call. Please advise.

## 2019-07-20 NOTE — Progress Notes (Signed)
6/16: Last dose of Coumadin.  6/17: No Coumadin or Lovenox.  6/18: Inject Lovenox 80 mg in the fatty abdominal tissue at least 2 inches from the belly button twice a day about 12 hours apart, 8am and 8pm rotate sites. No Coumadin.  6/19: Inject Lovenox 80 mg in the fatty tissue every 12 hours, 8am and 8pm. No Coumadin.  6/20: Inject Lovenox 80 mg in the fatty tissue every 12 hours, 8am and 8pm. No Coumadin.  6/21: Inject Lovenox 80 mg in the fatty tissue in the morning at 8 am (No PM dose). No Coumadin.  6/22: Procedure Day - No Lovenox - Resume Coumadin 7 mg (dose increase) in the evening or as directed by doctor   6/23: Resume Lovenox 80 mg inject in the fatty tissue every 12 hours and take Coumadin 7 mg.  6/24: Inject Lovenox 80 mg in the fatty tissue every 12 hours and take Coumadin 7 mg.  6/25: Inject Lovenox 80 mg in the fatty tissue every 12 hours and take Coumadin 7 mg.  6/26: Inject Lovenox 80 mg in the fatty tissue every 12 hours and take Coumadin 7 mg.  6/27: Inject Lovenox 80 mg in the fatty tissue every 12 hours and take Coumadin 7 mg.  6/28: Coumadin appt to check INR.

## 2019-07-21 ENCOUNTER — Telehealth: Payer: Self-pay

## 2019-07-21 ENCOUNTER — Ambulatory Visit: Payer: Self-pay | Admitting: Pharmacist

## 2019-07-21 DIAGNOSIS — E200 Idiopathic hypoparathyroidism: Secondary | ICD-10-CM | POA: Diagnosis not present

## 2019-07-21 DIAGNOSIS — R739 Hyperglycemia, unspecified: Secondary | ICD-10-CM | POA: Diagnosis not present

## 2019-07-21 NOTE — Telephone Encounter (Signed)
Tried to complete stat PA received multiple error message saying patient could not be found.  Verified patients insurance info with pharmacy.    Left message on patient machine to call back

## 2019-07-21 NOTE — Telephone Encounter (Signed)
See anticoagulation encounter from 6/17 for details

## 2019-07-21 NOTE — Telephone Encounter (Signed)
Patient called in and stated that the pharmacy will not cover the lovenox. Will route to pharmd pool for further review

## 2019-07-21 NOTE — Telephone Encounter (Signed)
Pa sent expedited and the number to call for [facility] is  [phone].

## 2019-07-21 NOTE — Telephone Encounter (Signed)
Pt coming to pck up bridging instructions and pa approved for lovenox and the pharmacy confirmed that they are able to run it

## 2019-07-21 NOTE — Telephone Encounter (Signed)
Routed to PharmD pool

## 2019-07-21 NOTE — Telephone Encounter (Signed)
Alexander Barton is returning Christopher's call.

## 2019-07-21 NOTE — Patient Instructions (Addendum)
6/18: Last dose of warfarin.  6/19: No warfarin or Lovenox (enoxaparin).  6/20: Inject enoxaparin 80 mg in the fatty abdominal tissue at least 2 inches from the belly button twice a day about 12 hours apart, 8am and 8pm rotate sites. No warfarin.  6/21: Inject enoxaaprin 80 mg in the fatty tissue every 12 hours, 8am and 8pm. No warfarin.  6/22: Inject enoxaparin 80 mg in the fatty tissue every 12 hours, 8am and 8pm. No warfarin.  6/23: Inject enoxaparin 80 mg in the fatty tissue in the morning at 8 am (No PM dose). No warfarin.  6/24: Procedure Day - No Enoxaparin - Resume warfarin 7 mg (dose increase) in the evening or as directed by doctor   6/25: Resume enoxaparin 80 mg inject in the fatty tissue every 12 hours and take warfarin 10 mg.  6/26: Inject enoxaparin 80 mg in the fatty tissue every 12 hours and take warfarin 7 mg.  6/27: Inject enoxaparin 80 mg in the fatty tissue every 12 hours and take warfarin 7 mg.  6/28: Inject enoxaparin 80 mg in the fatty tissue every 12 hours and take warfarin 7 mg.  6/29: Inject enoxaparin 80 mg in the fatty tissue every 12 hours and take warfarin 7 mg.  6/30: Repeat INR and call coumadin clinic.

## 2019-07-27 ENCOUNTER — Telehealth: Payer: Self-pay | Admitting: Family Medicine

## 2019-07-27 NOTE — Telephone Encounter (Signed)
I called the patient and left a VM for the patien to call back to ask questions about the handicap placard.  Rebekkah Powless,cma

## 2019-07-27 NOTE — Telephone Encounter (Signed)
Wife came in and states that handicap placard is about to expire and pt is having surgery again tomorrow. Please call when finished so she can pick it up

## 2019-07-28 ENCOUNTER — Telehealth: Payer: Self-pay

## 2019-07-28 DIAGNOSIS — K862 Cyst of pancreas: Secondary | ICD-10-CM | POA: Diagnosis not present

## 2019-07-28 DIAGNOSIS — I4891 Unspecified atrial fibrillation: Secondary | ICD-10-CM | POA: Diagnosis not present

## 2019-07-28 DIAGNOSIS — Z8679 Personal history of other diseases of the circulatory system: Secondary | ICD-10-CM | POA: Diagnosis not present

## 2019-07-28 DIAGNOSIS — R935 Abnormal findings on diagnostic imaging of other abdominal regions, including retroperitoneum: Secondary | ICD-10-CM | POA: Diagnosis not present

## 2019-07-28 DIAGNOSIS — I10 Essential (primary) hypertension: Secondary | ICD-10-CM | POA: Diagnosis not present

## 2019-07-28 DIAGNOSIS — J449 Chronic obstructive pulmonary disease, unspecified: Secondary | ICD-10-CM | POA: Diagnosis not present

## 2019-07-28 DIAGNOSIS — E119 Type 2 diabetes mellitus without complications: Secondary | ICD-10-CM | POA: Diagnosis not present

## 2019-07-28 DIAGNOSIS — K7689 Other specified diseases of liver: Secondary | ICD-10-CM | POA: Diagnosis not present

## 2019-07-28 DIAGNOSIS — E785 Hyperlipidemia, unspecified: Secondary | ICD-10-CM | POA: Diagnosis not present

## 2019-07-28 DIAGNOSIS — N400 Enlarged prostate without lower urinary tract symptoms: Secondary | ICD-10-CM | POA: Diagnosis not present

## 2019-07-28 DIAGNOSIS — K8689 Other specified diseases of pancreas: Secondary | ICD-10-CM | POA: Diagnosis not present

## 2019-07-28 NOTE — Telephone Encounter (Signed)
I called the patient and spoke with his wife and informed her the handicap placard was ready to pick up at the front desk, she understood.  Alyviah Crandle,cma

## 2019-07-28 NOTE — Telephone Encounter (Signed)
Signed.

## 2019-07-29 ENCOUNTER — Encounter: Payer: Self-pay | Admitting: Pharmacist Clinician (PhC)/ Clinical Pharmacy Specialist

## 2019-07-29 MED ORDER — ENOXAPARIN SODIUM 80 MG/0.8ML ~~LOC~~ SOLN: 80.0000 mg | Freq: Two times a day (BID) | SUBCUTANEOUS | 0 refills | Status: DC

## 2019-08-05 DIAGNOSIS — E785 Hyperlipidemia, unspecified: Secondary | ICD-10-CM | POA: Diagnosis not present

## 2019-08-05 DIAGNOSIS — Z4659 Encounter for fitting and adjustment of other gastrointestinal appliance and device: Secondary | ICD-10-CM | POA: Diagnosis not present

## 2019-08-05 DIAGNOSIS — K863 Pseudocyst of pancreas: Secondary | ICD-10-CM | POA: Diagnosis not present

## 2019-08-05 DIAGNOSIS — Z978 Presence of other specified devices: Secondary | ICD-10-CM | POA: Diagnosis not present

## 2019-08-05 DIAGNOSIS — I7101 Dissection of thoracic aorta: Secondary | ICD-10-CM | POA: Diagnosis not present

## 2019-08-05 DIAGNOSIS — J449 Chronic obstructive pulmonary disease, unspecified: Secondary | ICD-10-CM | POA: Diagnosis not present

## 2019-08-05 DIAGNOSIS — E119 Type 2 diabetes mellitus without complications: Secondary | ICD-10-CM | POA: Diagnosis not present

## 2019-08-05 DIAGNOSIS — I10 Essential (primary) hypertension: Secondary | ICD-10-CM | POA: Diagnosis not present

## 2019-08-05 DIAGNOSIS — Z4689 Encounter for fitting and adjustment of other specified devices: Secondary | ICD-10-CM | POA: Diagnosis not present

## 2019-08-05 DIAGNOSIS — K8689 Other specified diseases of pancreas: Secondary | ICD-10-CM | POA: Diagnosis not present

## 2019-08-05 DIAGNOSIS — Z8616 Personal history of COVID-19: Secondary | ICD-10-CM | POA: Diagnosis not present

## 2019-08-05 DIAGNOSIS — Z952 Presence of prosthetic heart valve: Secondary | ICD-10-CM | POA: Diagnosis not present

## 2019-08-05 DIAGNOSIS — Q231 Congenital insufficiency of aortic valve: Secondary | ICD-10-CM | POA: Diagnosis not present

## 2019-08-09 ENCOUNTER — Other Ambulatory Visit: Payer: Self-pay | Admitting: Internal Medicine

## 2019-08-09 DIAGNOSIS — J449 Chronic obstructive pulmonary disease, unspecified: Secondary | ICD-10-CM

## 2019-08-12 ENCOUNTER — Telehealth: Payer: Self-pay | Admitting: Cardiology

## 2019-08-12 DIAGNOSIS — K8511 Biliary acute pancreatitis with uninfected necrosis: Secondary | ICD-10-CM | POA: Diagnosis not present

## 2019-08-12 DIAGNOSIS — K862 Cyst of pancreas: Secondary | ICD-10-CM | POA: Diagnosis not present

## 2019-08-12 DIAGNOSIS — K863 Pseudocyst of pancreas: Secondary | ICD-10-CM | POA: Diagnosis not present

## 2019-08-12 DIAGNOSIS — K7689 Other specified diseases of liver: Secondary | ICD-10-CM | POA: Diagnosis not present

## 2019-08-12 DIAGNOSIS — I7103 Dissection of thoracoabdominal aorta: Secondary | ICD-10-CM | POA: Diagnosis not present

## 2019-08-12 NOTE — Telephone Encounter (Signed)
   Pt is having procedure done on Monday. The pt is having new symptoms per PA and would like to know if its safe for him to go through procedure or needs to be seen by Dr. [name] first. Symptoms is headache but VS is pretty much stable

## 2019-08-12 NOTE — Telephone Encounter (Signed)
From what is reported I feel it is safe to proceed with planned procedure  Lequan Dobratz [name] MD, [facility]

## 2019-08-12 NOTE — Telephone Encounter (Signed)
Spoke with pt with pt who report he is scheduled for an endoscopic stent removal or Monday but wanted to consult with Dr. [name] if he should proceed with procedure due to new symptoms. Pt report since 6/23 he has been experiencing off and on dizziness, lightheadedness, and headache. He report BP and HR has been WNL and headache is resolved with tylenol. However, pt voiced he is concerned because symptoms are similar to when he had prior TIA. Pt currently asymptomatic.  Will forward to MD

## 2019-08-12 NOTE — Telephone Encounter (Signed)
Pt updated and verbalized understanding.  

## 2019-08-15 DIAGNOSIS — E119 Type 2 diabetes mellitus without complications: Secondary | ICD-10-CM | POA: Diagnosis not present

## 2019-08-15 DIAGNOSIS — I10 Essential (primary) hypertension: Secondary | ICD-10-CM | POA: Diagnosis not present

## 2019-08-15 DIAGNOSIS — Z91041 Radiographic dye allergy status: Secondary | ICD-10-CM | POA: Diagnosis not present

## 2019-08-15 DIAGNOSIS — Z87891 Personal history of nicotine dependence: Secondary | ICD-10-CM | POA: Diagnosis not present

## 2019-08-15 DIAGNOSIS — Z978 Presence of other specified devices: Secondary | ICD-10-CM | POA: Diagnosis not present

## 2019-08-15 DIAGNOSIS — Z4659 Encounter for fitting and adjustment of other gastrointestinal appliance and device: Secondary | ICD-10-CM | POA: Diagnosis not present

## 2019-08-15 DIAGNOSIS — K863 Pseudocyst of pancreas: Secondary | ICD-10-CM | POA: Diagnosis not present

## 2019-08-15 DIAGNOSIS — Z8616 Personal history of COVID-19: Secondary | ICD-10-CM | POA: Diagnosis not present

## 2019-08-19 DIAGNOSIS — K8591 Acute pancreatitis with uninfected necrosis, unspecified: Secondary | ICD-10-CM | POA: Diagnosis not present

## 2019-08-19 DIAGNOSIS — E119 Type 2 diabetes mellitus without complications: Secondary | ICD-10-CM | POA: Diagnosis not present

## 2019-08-19 DIAGNOSIS — K8511 Biliary acute pancreatitis with uninfected necrosis: Secondary | ICD-10-CM | POA: Diagnosis not present

## 2019-08-22 ENCOUNTER — Ambulatory Visit (INDEPENDENT_AMBULATORY_CARE_PROVIDER_SITE_OTHER): Payer: Medicare PPO | Admitting: Internal Medicine

## 2019-08-22 ENCOUNTER — Telehealth: Payer: Self-pay | Admitting: Cardiology

## 2019-08-22 DIAGNOSIS — I359 Nonrheumatic aortic valve disorder, unspecified: Secondary | ICD-10-CM

## 2019-08-22 DIAGNOSIS — Z7901 Long term (current) use of anticoagulants: Secondary | ICD-10-CM | POA: Diagnosis not present

## 2019-08-22 LAB — POCT INR: INR: 1.2 — AB (ref 2.0–3.0)

## 2019-08-22 NOTE — Telephone Encounter (Signed)
Please see anti-coagulation note 

## 2019-08-22 NOTE — Telephone Encounter (Signed)
Ria Comment with [facility] is calling to report INR results. Please return call to [phone] to discuss.

## 2019-08-24 ENCOUNTER — Other Ambulatory Visit: Payer: Self-pay

## 2019-08-24 ENCOUNTER — Ambulatory Visit (INDEPENDENT_AMBULATORY_CARE_PROVIDER_SITE_OTHER): Payer: Medicare PPO

## 2019-08-24 DIAGNOSIS — Z7901 Long term (current) use of anticoagulants: Secondary | ICD-10-CM | POA: Diagnosis not present

## 2019-08-24 DIAGNOSIS — I359 Nonrheumatic aortic valve disorder, unspecified: Secondary | ICD-10-CM | POA: Diagnosis not present

## 2019-08-24 DIAGNOSIS — Z5181 Encounter for therapeutic drug level monitoring: Secondary | ICD-10-CM

## 2019-08-24 LAB — POCT INR: INR: 1.2 — AB (ref 2.0–3.0)

## 2019-08-24 NOTE — Patient Instructions (Signed)
Take warfarin 10 mg today and tomorrow  Continue enoxaparin injections.  Repeat INR Friday 08/26/2019

## 2019-08-26 ENCOUNTER — Ambulatory Visit (INDEPENDENT_AMBULATORY_CARE_PROVIDER_SITE_OTHER): Payer: Medicare PPO | Admitting: Pharmacist

## 2019-08-26 ENCOUNTER — Ambulatory Visit: Payer: Medicare PPO | Admitting: Pulmonary Disease

## 2019-08-26 ENCOUNTER — Other Ambulatory Visit: Payer: Self-pay

## 2019-08-26 ENCOUNTER — Encounter: Payer: Self-pay | Admitting: Pulmonary Disease

## 2019-08-26 VITALS — BP 112/60 | HR 76 | Temp 98.2°F | Ht 73.0 in | Wt 195.0 lb

## 2019-08-26 DIAGNOSIS — Z7901 Long term (current) use of anticoagulants: Secondary | ICD-10-CM

## 2019-08-26 DIAGNOSIS — J449 Chronic obstructive pulmonary disease, unspecified: Secondary | ICD-10-CM

## 2019-08-26 DIAGNOSIS — I359 Nonrheumatic aortic valve disorder, unspecified: Secondary | ICD-10-CM | POA: Diagnosis not present

## 2019-08-26 DIAGNOSIS — J4489 Other specified chronic obstructive pulmonary disease: Secondary | ICD-10-CM | POA: Insufficient documentation

## 2019-08-26 DIAGNOSIS — T7589XA Other specified effects of external causes, initial encounter: Secondary | ICD-10-CM | POA: Insufficient documentation

## 2019-08-26 LAB — POCT INR: INR: 1.4 — AB (ref 2.0–3.0)

## 2019-08-26 MED ORDER — BREZTRI AEROSPHERE 160-9-4.8 MCG/ACT IN AERO
2.0000 | INHALATION_SPRAY | Freq: Two times a day (BID) | RESPIRATORY_TRACT | 0 refills | Status: DC
Start: 2019-08-26 — End: 2019-08-26

## 2019-08-26 MED ORDER — BREZTRI AEROSPHERE 160-9-4.8 MCG/ACT IN AERO
2.0000 | INHALATION_SPRAY | Freq: Two times a day (BID) | RESPIRATORY_TRACT | 3 refills | Status: DC
Start: 2019-08-26 — End: 2021-04-12

## 2019-08-26 NOTE — Patient Instructions (Addendum)
You were seen today by Lauraine Rinne, NP  for:   1. Chronic obstructive pulmonary disease, unspecified COPD type ([place]) 2. Asthma-COPD overlap syndrome (HCC)  Start Breztri >>> 2 puffs in the morning right when you wake up, rinse out your mouth after use, 12 hours later 2 puffs, rinse after use >>> Take this daily, no matter what >>> This is not a rescue inhaler   Only use your albuterol as a rescue medication to be used if you can't catch your breath by resting or doing a relaxed purse lip breathing pattern.  - The less you use it, the better it will work when you need it. - Ok to use up to 2 puffs  every 4 hours if you must but call for immediate appointment if use goes up over your usual need - Don't leave home without it !!  (think of it like the spare tire for your car)   Please stop your Symbicort 160 and Spiriva Respimat 2.5  We will order a breathing test to further evaluate your lung functioning  Please let us know if after you review your [facility] records if you see a result for something called: Alpha-1 antitrypsin  If you do not see these in your records please let us know and we can place an order for you to obtain this lab test   We recommend today:   Meds ordered this encounter  Medications  . Budeson-Glycopyrrol-Formoterol (BREZTRI AEROSPHERE) 160-9-4.8 MCG/ACT AERO    Sig: Inhale 2 puffs into the lungs in the morning and at bedtime.    Dispense:  10.7 g    Refill:  0    Follow Up:    Return in about 6 months (around 02/26/2020), or if symptoms worsen or fail to improve, for [facility] - Dr. Mortimer Fries, Follow up for FULL PFT - 60 min.   Please do your part to reduce the spread of COVID-19:      Reduce your risk of any infection  and COVID19 by using the similar precautions used for avoiding the common cold or flu:  Marland Kitchen Wash your hands often with soap and warm water for at least 20 seconds.  If soap and water are not readily available, use an alcohol-based hand  sanitizer with at least 60% alcohol.  . If coughing or sneezing, cover your mouth and nose by coughing or sneezing into the elbow areas of your shirt or coat, into a tissue or into your sleeve (not your hands). Langley Gauss A MASK when in public  . Avoid shaking hands with others and consider head nods or verbal greetings only. . Avoid touching your eyes, nose, or mouth with unwashed hands.  . Avoid close contact with people who are sick. . Avoid places or events with large numbers of people in one location, like concerts or sporting events. . If you have some symptoms but not all symptoms, continue to monitor at home and seek medical attention if your symptoms worsen. . If you are having a medical emergency, call 911.   [place] / e-Visit: [email or web address]         [facility]: [phone]  [facility]: [phone]                   [facility]: [phone]     It is flu season:   >>> Best ways to protect herself from the flu: Receive the  yearly flu vaccine, practice good hand hygiene washing with soap and also using hand sanitizer when available, eat a nutritious meals, get adequate rest, hydrate appropriately   Please contact the office if your symptoms worsen or you have concerns that you are not improving.   Thank you for choosing [facility] for your healthcare, and for allowing [place] to partner with you on your healthcare journey. I am thankful to be able to provide care to you today.   Wyn Quaker FNP-C

## 2019-08-26 NOTE — Progress Notes (Signed)
_0  ID: Alexander Barton, male    DOB: 10-27-1950, 69 y.o.   MRN: [id number]  Chief Complaint  Patient presents with  . Follow-up    No complaints / follow up    Referring provider: Leone Haven, MD  HPI:  69 year old male never smoker followed in our office for obstructive pulmonary disease  PMH: Hypertension, type 2 diabetes, BPH, physical deconditioning, GERD, thyroid nodule, cardiomyopathy, UTI, Smoker/ Smoking History: Never smoker Maintenance: Symbicort 160, Spiriva Respimat 2.5 Pt of: DK  08/26/2019  - Visit   69 year old male never smoker followed in our office by Dr. Mortimer Fries.  Initially established care in May/2020.  This was during a virtual visit.  Prior to last office visit there was an overnight oximetry done as well as a 6-minute walk test that did not show hypoxemia.  Patient is a non-smoker however was exposed to secondhand smoke for approximately 20 years.  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  He was diagnosed by Dr. Mortimer Fries with COPD.  He is maintained on Spiriva Respimat 2.5, Symbicort as well as albuterol as needed.  Since last being seen patient has been followed by [facility] gastroenterology for pancreatic necrosis.  He is also had multiple follow-ups with family medicine.  Overall patient reports he is doing well.  No worsened symptoms of dyspnea.  Has had to use his rescue inhaler about 3 times over the last year.  Patient is unsure if he is ever had alpha-1 antitrypsin deficiency testing.  We will discuss this today.  Patient reports he had a repeat office spirometry and showed improved lung function.  He has never had a full 1 hour pulmonary function test.  Questionaires / Pulmonary Flowsheets:   ACT:  No flowsheet data found.  MMRC: mMRC Dyspnea Scale mMRC Score  08/26/2019 2    Epworth:  No flowsheet data found.  Tests:   06/04/2019-CT angio abdomen-lower chest-bibasilar atelectasis  with tiny bilateral pleural effusions  10/20/2017-CT angio chest-mild dependent changes in lung bases, no airspace disease or consolidation, no pleural effusions  07/22/2016-spirometry-FVC 3.3 (63% predicted), ratio of 66, FEV1 2.2 (56% predicted)  06/29/2019-CBC with differential-eosinophils relative 5.3, eosinophils absolute 0.3  FENO:  No results found for: NITRICOXIDE  PFT: No flowsheet data found.  WALK:  SIX MIN WALK 09/22/2018 11/21/2016 08/12/2016  2 Minute Oxygen Saturation % 94 97 80  2 Minute HR 113 114 68  4 Minute Oxygen Saturation % 94 97 -  4 Minute HR 112 128 -  6 Minute Oxygen Saturation % 95 97 100  6 Minute HR 121 127 93    Imaging: No results found.  Lab Results:  CBC    Component Value Date/Time   WBC 5.1 06/29/2019 0958   RBC 3.67 (L) 06/29/2019 0958   HGB 10.7 (L) 06/29/2019 0958   HGB 9.3 (L) 01/10/2019 1228   HCT 32.3 (L) 06/29/2019 0958   HCT 28.4 (L) 01/10/2019 1228   PLT 244.0 06/29/2019 0958   PLT 305 01/10/2019 1228   MCV 88.0 06/29/2019 0958   MCV 90 01/10/2019 1228   MCH 27.8 06/05/2019 0343   MCHC 33.2 06/29/2019 0958   RDW 18.1 (H) 06/29/2019 0958   RDW 14.3 01/10/2019 1228   LYMPHSABS 1.5 06/29/2019 0958   MONOABS 0.5 06/29/2019 0958   EOSABS 0.3 06/29/2019 0958   BASOSABS 0.0 06/29/2019 0958    BMET    Component Value Date/Time  NA 137 06/29/2019 0958   NA 137 01/10/2019 1228   K 4.3 06/29/2019 0958   CL 101 06/29/2019 0958   CO2 28 06/29/2019 0958   GLUCOSE 150 (H) 06/29/2019 0958   BUN 19 06/29/2019 0958   BUN 14 01/10/2019 1228   CREATININE 1.14 06/29/2019 0958   CALCIUM 9.7 06/29/2019 0958   GFRNONAA >60 06/05/2019 0343   GFRAA >60 06/05/2019 0343    BNP    Component Value Date/Time   BNP 192.0 (H) 06/23/2016 1646    ProBNP No results found for: PROBNP  Specialty Problems      Pulmonary Problems   Chronic obstructive pulmonary disease (HCC)   Dyspnea on exertion   Asthma-COPD overlap syndrome (HCC)       Allergies  Allergen Reactions  . Codeine Anaphylaxis  . Gadolinium Derivatives Other (See Comments)    Other Reaction: intense sneezing MRI contrast  . Hydrocodone Anaphylaxis  . Iodinated Diagnostic Agents Other (See Comments)    Uncontrollable sneezing, needs sedative the night before and hour and benadryl before using Other Reaction: sneezing with SOB after prep  . Statins Other (See Comments)    Myalgias  . Hydromorphone Other (See Comments)  . Lipitor [Atorvastatin Calcium] Other (See Comments)    Myalgias   . Seroquel [Quetiapine Fumerate] Other (See Comments)    "bad trip" tongue swell    Immunization History  Administered Date(s) Administered  . Influenza Split 02/14/2011  . Influenza, High Dose Seasonal PF 11/04/2016, 11/23/2017  . Influenza,inj,Quad PF,6+ Mos 11/14/2014  . Influenza-Unspecified 11/14/2014  . Pneumococcal Conjugate-13 11/14/2014  . Pneumococcal Polysaccharide-23 01/04/2007, 12/05/2015  . Td 02/04/2000  . Tdap 08/10/2012  . Zoster 10/04/2010    Past Medical History:  Diagnosis Date  . Aortic aneurysm, thoracic ([place])   . Aortic valve disease   . Arthritis   . BPH (benign prostatic hyperplasia)   . COPD (chronic obstructive pulmonary disease) ([place])   . GERD (gastroesophageal reflux disease)   . HTN (hypertension)   . Hypercholesterolemia   . Stroke ([place])   . TIA (transient ischemic attack)     Tobacco History: Social History   Tobacco Use  Smoking Status Never Smoker  Smokeless Tobacco Never Used   Counseling given: Not Answered   Continue to not smoke  Outpatient Encounter Medications as of 08/26/2019  Medication Sig  . acetaminophen (TYLENOL) 500 MG tablet Take 1,000 mg by mouth every 6 (six) hours as needed for mild pain.  Marland Kitchen albuterol (VENTOLIN HFA) 108 (90 Base) MCG/ACT inhaler Inhale 2 puffs into the lungs every 4 (four) hours as needed for wheezing or shortness of breath.  . blood glucose meter kit and supplies KIT  Dispense based on patient and insurance preference. Use twice daily. (FOR ICD-10 E11.9).  . budesonide-formoterol (SYMBICORT) 160-4.5 MCG/ACT inhaler TAKE 2 PUFFS BY MOUTH TWICE A DAY  . enoxaparin (LOVENOX) 80 MG/0.8ML injection Inject 0.8 mLs (80 mg total) into the skin every 12 (twelve) hours.  Marland Kitchen glucose blood test strip Use twice daily E11.9  . Investigational - Study Medication Take 1 tablet by mouth daily. Study name: [id number] Additional study details: cholesterol [facility] [phone]  . Multiple Vitamin (MULTIVITAMIN WITH MINERALS) TABS tablet Take 1 tablet by mouth daily.  . ondansetron (ZOFRAN) 4 MG tablet Take 1 tablet (4 mg total) by mouth every 6 (six) hours as needed for nausea.  Marland Kitchen OVER THE COUNTER MEDICATION Take 1 capsule by mouth daily. Dotera Digestive Supplement  . polyethylene glycol (MIRALAX /  GLYCOLAX) 17 g packet Take 17 g by mouth daily.  Marland Kitchen senna (SENOKOT) 8.6 MG TABS tablet Take 2 tablets by mouth in the morning and at bedtime.  . tamsulosin (FLOMAX) 0.4 MG CAPS capsule TAKE 2 CAPSULES BY MOUTH EVERY DAY (Patient taking differently: Take 0.4 mg by mouth in the morning and at bedtime. )  . Tiotropium Bromide Monohydrate (SPIRIVA RESPIMAT) 2.5 MCG/ACT AERS INHALE 2.5 MCG INTO THE LUNGS 2 (TWO) TIMES DAILY. (Patient taking differently: Inhale 1 puff into the lungs in the morning and at bedtime. )  . warfarin (COUMADIN) 5 MG tablet Take 1 tablet (5 mg total) by mouth one time only at 4 PM.  . [DISCONTINUED] traMADol (ULTRAM) 50 MG tablet Take 1 tablet (50 mg total) by mouth every 6 (six) hours as needed.  . Budeson-Glycopyrrol-Formoterol (BREZTRI AEROSPHERE) 160-9-4.8 MCG/ACT AERO Inhale 2 puffs into the lungs in the morning and at bedtime.  . Budeson-Glycopyrrol-Formoterol (BREZTRI AEROSPHERE) 160-9-4.8 MCG/ACT AERO Inhale 2 puffs into the lungs in the morning and at bedtime.  . clobetasol ointment (TEMOVATE) 0.05 % Apply 1 application topically daily as needed for  rash. (Patient not taking: Reported on 08/26/2019)  . furosemide (LASIX) 20 MG tablet TAKE 1/2 TABLET (10 MG) 1 OR 2 TIMES A DAY AS NEEDED FOR LEG SWELLING (Patient not taking: TAKE 1/2 TABLET (10 MG) 1 OR 2 TIMES A DAY AS NEEDED FOR LEG SWELLING)  . predniSONE (DELTASONE) 50 MG tablet Take 1 tablet (50 mg) by mouth 13 hours prior to your scan, then take 1 tablet (50 mg) by mouth 7 hours prior to scan, then take 1 tablet (50 mg) by mouth 1 hour prior to your scan (Patient not taking: Reported on 08/26/2019)  . [DISCONTINUED] calcitRIOL (ROCALTROL) 0.25 MCG capsule Take 1 capsule (0.25 mcg total) by mouth daily. (Patient not taking: Reported on 08/26/2019)  . [DISCONTINUED] phenylephrine (NEO-SYNEPHRINE) 0.25 % nasal spray Place 2 sprays into both nostrils daily as needed (nose bleeds). (Patient not taking: Reported on 08/26/2019)   No facility-administered encounter medications on file as of 08/26/2019.     Review of Systems  Review of Systems  Constitutional: Positive for fatigue. Negative for activity change, chills, fever and unexpected weight change.  HENT: Negative for postnasal drip, rhinorrhea, sinus pressure, sinus pain and sore throat.   Eyes: Negative.   Respiratory: Positive for cough and shortness of breath. Negative for wheezing.   Cardiovascular: Negative for chest pain and palpitations.  Gastrointestinal: Negative for constipation, diarrhea, nausea and vomiting.  Endocrine: Negative.   Genitourinary: Negative.   Musculoskeletal: Negative.   Skin: Negative.   Neurological: Negative for dizziness and headaches.  Psychiatric/Behavioral: Negative.  Negative for dysphoric mood. The patient is not nervous/anxious.   All other systems reviewed and are negative.    Physical Exam  BP (!) 112/60 (BP Location: Left Arm, Cuff Size: Normal)   Pulse 76   Temp 98.2 F (36.8 C) (Oral)   Ht 6\' 1"  (1.854 m)   Wt 195 lb (88.5 kg)   SpO2 96% Comment: Room air  BMI 25.73 kg/m   Wt  Readings from Last 5 Encounters:  08/26/19 195 lb (88.5 kg)  06/29/19 195 lb (88.5 kg)  06/13/19 202 lb (91.6 kg)  06/08/19 210 lb 12.8 oz (95.6 kg)  05/30/19 215 lb 2.7 oz (97.6 kg)    BMI Readings from Last 5 Encounters:  08/26/19 25.73 kg/m  06/29/19 25.73 kg/m  06/13/19 26.65 kg/m  06/08/19 27.81 kg/m  05/30/19 28.39  kg/m     Physical Exam Vitals and nursing note reviewed.  Constitutional:      General: He is not in acute distress.    Appearance: Normal appearance. He is normal weight.  HENT:     Head: Normocephalic and atraumatic.     Right Ear: Hearing and external ear normal.     Left Ear: Hearing and external ear normal.     Nose: Nose normal. No mucosal edema or rhinorrhea.     Right Turbinates: Not enlarged.     Left Turbinates: Not enlarged.     Mouth/Throat:     Mouth: Mucous membranes are dry.     Pharynx: Oropharynx is clear. No oropharyngeal exudate.  Eyes:     Pupils: Pupils are equal, round, and reactive to light.  Cardiovascular:     Rate and Rhythm: Normal rate and regular rhythm.     Pulses: Normal pulses.     Heart sounds: Normal heart sounds. No murmur heard.   Pulmonary:     Effort: Pulmonary effort is normal.     Breath sounds: Normal breath sounds. No decreased breath sounds, wheezing or rales.  Musculoskeletal:     Cervical back: Normal range of motion.     Right lower leg: No edema.     Left lower leg: No edema.  Lymphadenopathy:     Cervical: No cervical adenopathy.  Skin:    General: Skin is warm and dry.     Capillary Refill: Capillary refill takes less than 2 seconds.     Findings: No erythema or rash.  Neurological:     General: No focal deficit present.     Mental Status: He is alert and oriented to person, place, and time.     Motor: No weakness.     Coordination: Coordination normal.     Gait: Gait is intact. Gait normal.  Psychiatric:        Mood and Affect: Mood normal.        Behavior: Behavior normal. Behavior is  cooperative.        Thought Content: Thought content normal.        Judgment: Judgment normal.       Assessment & Plan:   Asthma-COPD overlap syndrome ([place]) Believe patient likely has asthma COPD overlap syndrome Patient is a never smoker 20 years to smoke exposure Elevated eosinophil counts  Plan: We will order pulmonary function testing to further evaluate  Chronic obstructive pulmonary disease ([place]) Plan: Will start Breztri  Stop Symbicort 160, stop Spiriva Respimat 2.5 We will order pulmonary function testing Patient will let us know if he has had alpha-1 antitrypsin deficiency lab testing, if not okay to place order   Environmental exposure Patient previously worked in a [employer] of dust exposure  Plan: We will order pulmonary function testing    Return in about 6 months (around 02/26/2020), or if symptoms worsen or fail to improve, for [facility] - Dr. Mortimer Fries, Follow up for FULL PFT - 60 min.   Lauraine Rinne, NP 08/26/2019   This appointment required 32 minutes of patient care (this includes precharting, chart review, review of results, face-to-face care, etc.).

## 2019-08-26 NOTE — Addendum Note (Signed)
Addended by: Merrilee Seashore on: 08/26/2019 03:49 PM   Modules accepted: Orders

## 2019-08-26 NOTE — Assessment & Plan Note (Addendum)
Plan: Will start Breztri  Stop Symbicort 160, stop Spiriva Respimat 2.5 We will order pulmonary function testing Patient will let us know if he has had alpha-1 antitrypsin deficiency lab testing, if not okay to place order

## 2019-08-26 NOTE — Assessment & Plan Note (Signed)
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

## 2019-08-26 NOTE — Assessment & Plan Note (Signed)
Believe patient likely has asthma COPD overlap syndrome Patient is a never smoker 20 years to smoke exposure Elevated eosinophil counts  Plan: We will order pulmonary function testing to further evaluate

## 2019-08-29 ENCOUNTER — Ambulatory Visit (INDEPENDENT_AMBULATORY_CARE_PROVIDER_SITE_OTHER): Payer: Medicare PPO | Admitting: Cardiology

## 2019-08-29 DIAGNOSIS — I359 Nonrheumatic aortic valve disorder, unspecified: Secondary | ICD-10-CM

## 2019-08-29 DIAGNOSIS — Z7901 Long term (current) use of anticoagulants: Secondary | ICD-10-CM | POA: Diagnosis not present

## 2019-08-29 DIAGNOSIS — J449 Chronic obstructive pulmonary disease, unspecified: Secondary | ICD-10-CM

## 2019-08-29 LAB — POCT INR: INR: 1.9 — AB (ref 2.0–3.0)

## 2019-08-29 NOTE — Telephone Encounter (Signed)
Aaron Edelman, please advise.   Would you like to order alpha 1?

## 2019-08-29 NOTE — Telephone Encounter (Signed)
Yes please order the alpha-1 antitrypsin lab work with phenotype.  Please also thank the patient for following up with [place] so quickly.  This can be coordinated at whichever lab location he finds most convenient.  Please let the patient know there are delays in this lab actually resulting.  May take up to 5-7 business days.  We will follow-up with him as soon as I have those results.  Just a reminder to the patient this is a screening test for genetic component to COPD/emphysema.  Wyn Quaker, FNP

## 2019-08-31 ENCOUNTER — Ambulatory Visit (INDEPENDENT_AMBULATORY_CARE_PROVIDER_SITE_OTHER): Payer: Medicare PPO | Admitting: Cardiology

## 2019-08-31 DIAGNOSIS — I359 Nonrheumatic aortic valve disorder, unspecified: Secondary | ICD-10-CM

## 2019-08-31 DIAGNOSIS — Z7901 Long term (current) use of anticoagulants: Secondary | ICD-10-CM | POA: Diagnosis not present

## 2019-08-31 LAB — POCT INR: INR: 1.8 — AB (ref 2.0–3.0)

## 2019-09-05 ENCOUNTER — Ambulatory Visit (INDEPENDENT_AMBULATORY_CARE_PROVIDER_SITE_OTHER): Payer: Medicare PPO | Admitting: Cardiology

## 2019-09-05 DIAGNOSIS — Z7901 Long term (current) use of anticoagulants: Secondary | ICD-10-CM

## 2019-09-05 DIAGNOSIS — I359 Nonrheumatic aortic valve disorder, unspecified: Secondary | ICD-10-CM

## 2019-09-05 LAB — POCT INR: INR: 1.6 — AB (ref 2.0–3.0)

## 2019-09-06 ENCOUNTER — Telehealth: Payer: Self-pay

## 2019-09-06 NOTE — Telephone Encounter (Signed)
Pt is aware of date/time of covid test.   

## 2019-09-07 ENCOUNTER — Ambulatory Visit (INDEPENDENT_AMBULATORY_CARE_PROVIDER_SITE_OTHER): Payer: Medicare PPO | Admitting: Pharmacist

## 2019-09-07 DIAGNOSIS — Z7901 Long term (current) use of anticoagulants: Secondary | ICD-10-CM

## 2019-09-07 DIAGNOSIS — I359 Nonrheumatic aortic valve disorder, unspecified: Secondary | ICD-10-CM | POA: Diagnosis not present

## 2019-09-07 LAB — POCT INR: INR: 1.8 — AB (ref 2.0–3.0)

## 2019-09-08 ENCOUNTER — Other Ambulatory Visit
Admission: RE | Admit: 2019-09-08 | Discharge: 2019-09-08 | Disposition: A | Discharge status: Home / Self Care | Payer: Medicare PPO | Attending: Pulmonary Disease | Admitting: Pulmonary Disease

## 2019-09-08 ENCOUNTER — Other Ambulatory Visit: Payer: Self-pay

## 2019-09-08 DIAGNOSIS — J449 Chronic obstructive pulmonary disease, unspecified: Secondary | ICD-10-CM | POA: Insufficient documentation

## 2019-09-09 ENCOUNTER — Other Ambulatory Visit
Admission: RE | Admit: 2019-09-09 | Discharge: 2019-09-09 | Disposition: A | Discharge status: Home / Self Care | Payer: Medicare PPO | Source: Ambulatory Visit | Attending: Pulmonary Disease | Admitting: Pulmonary Disease

## 2019-09-09 DIAGNOSIS — Z01812 Encounter for preprocedural laboratory examination: Secondary | ICD-10-CM | POA: Diagnosis not present

## 2019-09-09 DIAGNOSIS — Z20822 Contact with and (suspected) exposure to covid-19: Secondary | ICD-10-CM | POA: Insufficient documentation

## 2019-09-10 LAB — SARS CORONAVIRUS 2 (TAT 6-24 HRS): SARS Coronavirus 2: NEGATIVE

## 2019-09-12 ENCOUNTER — Other Ambulatory Visit: Payer: Self-pay

## 2019-09-12 ENCOUNTER — Ambulatory Visit: Payer: Medicare PPO | Attending: Pulmonary Disease

## 2019-09-12 ENCOUNTER — Ambulatory Visit (INDEPENDENT_AMBULATORY_CARE_PROVIDER_SITE_OTHER): Payer: Medicare PPO | Admitting: Cardiology

## 2019-09-12 ENCOUNTER — Other Ambulatory Visit: Payer: Self-pay | Admitting: Surgery

## 2019-09-12 ENCOUNTER — Telehealth: Payer: Self-pay | Admitting: Family Medicine

## 2019-09-12 DIAGNOSIS — K863 Pseudocyst of pancreas: Secondary | ICD-10-CM | POA: Diagnosis not present

## 2019-09-12 DIAGNOSIS — J449 Chronic obstructive pulmonary disease, unspecified: Secondary | ICD-10-CM | POA: Insufficient documentation

## 2019-09-12 DIAGNOSIS — R29898 Other symptoms and signs involving the musculoskeletal system: Secondary | ICD-10-CM

## 2019-09-12 DIAGNOSIS — K8010 Calculus of gallbladder with chronic cholecystitis without obstruction: Secondary | ICD-10-CM | POA: Diagnosis not present

## 2019-09-12 DIAGNOSIS — I359 Nonrheumatic aortic valve disorder, unspecified: Secondary | ICD-10-CM | POA: Diagnosis not present

## 2019-09-12 DIAGNOSIS — Z7901 Long term (current) use of anticoagulants: Secondary | ICD-10-CM

## 2019-09-12 LAB — PULMONARY FUNCTION TEST ARMC ONLY
DL/VA % pred: 93 %
DL/VA: 3.77 ml/min/mmHg/L
DLCO unc % pred: 92 %
DLCO unc: 24.85 ml/min/mmHg
FEF 25-75 Post: 2.2 L/sec
FEF 25-75 Pre: 1.59 L/sec
FEF2575-%Change-Post: 38 %
FEF2575-%Pred-Post: 85 %
FEF2575-%Pred-Pre: 61 %
FEV1-%Change-Post: 7 %
FEV1-%Pred-Post: 79 %
FEV1-%Pred-Pre: 74 %
FEV1-Post: 2.7 L
FEV1-Pre: 2.52 L
FEV1FVC-%Change-Post: 4 %
FEV1FVC-%Pred-Pre: 95 %
FEV6-%Change-Post: 3 %
FEV6-%Pred-Post: 83 %
FEV6-%Pred-Pre: 80 %
FEV6-Post: 3.62 L
FEV6-Pre: 3.49 L
FEV6FVC-%Change-Post: 2 %
FEV6FVC-%Pred-Post: 105 %
FEV6FVC-%Pred-Pre: 103 %
FVC-%Change-Post: 2 %
FVC-%Pred-Post: 79 %
FVC-%Pred-Pre: 77 %
FVC-Post: 3.66 L
Post FEV1/FVC ratio: 74 %
Post FEV6/FVC ratio: 100 %
Pre FEV1/FVC ratio: 71 %
Pre FEV6/FVC Ratio: 98 %
RV % pred: 121 %
RV: 3.04 L
TLC % pred: 101 %
TLC: 7.3 L

## 2019-09-12 LAB — ALPHA-1 ANTITRYPSIN PHENOTYPE: A-1 Antitrypsin, Ser: 138 mg/dL (ref 101–187)

## 2019-09-12 LAB — POCT INR: INR: 1.8 — AB (ref 2.0–3.0)

## 2019-09-12 MED ORDER — ALBUTEROL SULFATE (2.5 MG/3ML) 0.083% IN NEBU
2.5000 mg | INHALATION_SOLUTION | Freq: Once | RESPIRATORY_TRACT | Status: AC
  Administered 2019-09-12: 2.5 mg via RESPIRATORY_TRACT
  Filled 2019-09-12: qty 3

## 2019-09-12 NOTE — Telephone Encounter (Signed)
Do you need to see this patient again before placing a new referral?

## 2019-09-12 NOTE — Telephone Encounter (Signed)
Pt called in wanted a new order for physical therapy for same one he had before please

## 2019-09-13 ENCOUNTER — Other Ambulatory Visit: Payer: Self-pay | Admitting: Cardiology

## 2019-09-13 NOTE — Telephone Encounter (Signed)
Referral placed.

## 2019-09-13 NOTE — Telephone Encounter (Signed)
Patient aware new referral was placed for PT

## 2019-09-14 ENCOUNTER — Telehealth: Payer: Self-pay | Admitting: *Deleted

## 2019-09-14 NOTE — Telephone Encounter (Addendum)
Patient takes warfarin for ON-X aortic valve replacement, also has history of TIA in 2012 and CVA in 2014. Goal INR 2-2.5 per [facility] cardiology.   Next INR due 8/16  Procedure: Laparoscopic cholecystectomy    Date of procedure: TBD  CrCl 66 mL/min Platelet count 244K  Per office protocol, patient can hold warfarin  for 5  days prior to procedure.   Patient WILL need bridging with Lovenox (enoxaparin) around procedure.   For orthopedic procedures please be sure to resume therapeutic (not prophylactic) dosing.

## 2019-09-14 NOTE — Telephone Encounter (Signed)
Reached out to patient, he will call once procedure date set - most likely in early September.  He knows to call once date set.

## 2019-09-14 NOTE — Telephone Encounter (Signed)
   Primary Cardiologist: [name], MD  Chart reviewed and the patient was contacted by phone today as part of pre-operative protocol coverage. Given past medical history and time since last visit, based on ACC/AHA guidelines, Alexander Barton would be at acceptable risk for the planned procedure without further cardiovascular testing.   Our pharmacist will contact the patient about warfarin to Lovenox bridging.  I will route this recommendation to the requesting party via Epic fax function and remove from pre-op pool.  Please call with questions.  Kerin Ransom, PA-C 09/14/2019, 1:48 PM

## 2019-09-14 NOTE — Telephone Encounter (Signed)
° °  [facility] Pre-operative Risk Assessment    HEARTCARE STAFF: - Please ensure there is not already an duplicate clearance open for this procedure. - Under Visit Info/Reason for Call, type in Other and utilize the format Clearance MM/DD/YY or Clearance TBD. Do not use dashes or single digits. - If request is for dental extraction, please clarify the # of teeth to be extracted.  Request for surgical clearance:  1. What type of surgery is being performed? Laparoscopic cholecystectomy     2. When is this surgery scheduled? TBD   3. What type of clearance is required (medical clearance vs. Pharmacy clearance to hold med vs. Both)? both  4. Are there any medications that need to be held prior to surgery and how long?warfarin-need direction   5. Practice name and name of physician performing surgery? [facility]   6. What is the office phone number? [phone]    7.   What is the office fax number? [phone]  8.   Anesthesia type (None, local, MAC, general) ? general   Alexander Barton 09/14/2019, 7:29 AM  _________________________________________________________________   (provider comments below)

## 2019-09-16 ENCOUNTER — Telehealth: Payer: Self-pay | Admitting: Cardiology

## 2019-09-16 ENCOUNTER — Other Ambulatory Visit: Payer: Self-pay

## 2019-09-16 ENCOUNTER — Ambulatory Visit (INDEPENDENT_AMBULATORY_CARE_PROVIDER_SITE_OTHER): Payer: Medicare PPO

## 2019-09-16 ENCOUNTER — Telehealth: Payer: Self-pay | Admitting: Pharmacist

## 2019-09-16 DIAGNOSIS — Z7901 Long term (current) use of anticoagulants: Secondary | ICD-10-CM

## 2019-09-16 DIAGNOSIS — R410 Disorientation, unspecified: Secondary | ICD-10-CM

## 2019-09-16 DIAGNOSIS — I359 Nonrheumatic aortic valve disorder, unspecified: Secondary | ICD-10-CM

## 2019-09-16 DIAGNOSIS — Z5181 Encounter for therapeutic drug level monitoring: Secondary | ICD-10-CM

## 2019-09-16 LAB — POCT INR: INR: 2.1 (ref 2.0–3.0)

## 2019-09-16 NOTE — Telephone Encounter (Signed)
Patient will be seen today at 3:30pm at coumadin clinic

## 2019-09-16 NOTE — Telephone Encounter (Signed)
Spoke to patient's wife Dr.Jordan advised ok to refer to Neurology.Advised order placed.Neurology office will call with appointment.

## 2019-09-16 NOTE — Patient Instructions (Signed)
Continue taking 10 mg daily except 7 mg each Saturday.  Repeat INR Monday

## 2019-09-16 NOTE — Telephone Encounter (Signed)
Ok to refer to Neurology  Ashlee Bewley [name] MD, [facility]

## 2019-09-16 NOTE — Telephone Encounter (Signed)
Mr Alexander Barton is experiencing periods of occasional confusion for few months now and worsen in the last 2 days. Noted sudtherapeyutic INR since last procedure on 6/22. We bridged him with lovenox 80mg  twice daily from 6/18 until 8/4.    Patient denies weakness, vision changes, speech difficulties, headaches, problems walking or facial changes.  I instructed patient and wife to visit nearest Emergency Room if any of the above symptoms noted. He was also instructed to repeat INR today ASAP to verify of level is therapeutic.   Patient request referral to neurologist from Dr [name]. I will forward message to Dr [name] and Malachy Mood (nurse).

## 2019-09-16 NOTE — Telephone Encounter (Signed)
New message:     Patient calling stating that he was going to check his INR patient couldn't the machine to work, and states that the result needed to be in by 5 today. Patient also states he is on his way to [place]. Please call back.

## 2019-09-19 ENCOUNTER — Encounter: Payer: Self-pay | Admitting: Neurology

## 2019-09-19 ENCOUNTER — Ambulatory Visit (INDEPENDENT_AMBULATORY_CARE_PROVIDER_SITE_OTHER): Payer: Medicare PPO | Admitting: Internal Medicine

## 2019-09-19 DIAGNOSIS — Z7901 Long term (current) use of anticoagulants: Secondary | ICD-10-CM | POA: Diagnosis not present

## 2019-09-19 DIAGNOSIS — I359 Nonrheumatic aortic valve disorder, unspecified: Secondary | ICD-10-CM | POA: Diagnosis not present

## 2019-09-19 LAB — POCT INR: INR: 2 (ref 2.0–3.0)

## 2019-09-20 ENCOUNTER — Telehealth: Payer: Self-pay

## 2019-09-20 ENCOUNTER — Telehealth (INDEPENDENT_AMBULATORY_CARE_PROVIDER_SITE_OTHER): Payer: Medicare PPO | Admitting: Nurse Practitioner

## 2019-09-20 ENCOUNTER — Encounter: Payer: Self-pay | Admitting: Nurse Practitioner

## 2019-09-20 ENCOUNTER — Other Ambulatory Visit: Payer: Self-pay

## 2019-09-20 VITALS — BP 112/86 | Temp 97.3°F | Ht 73.0 in | Wt 190.0 lb

## 2019-09-20 DIAGNOSIS — R410 Disorientation, unspecified: Secondary | ICD-10-CM

## 2019-09-20 DIAGNOSIS — G44209 Tension-type headache, unspecified, not intractable: Secondary | ICD-10-CM | POA: Diagnosis not present

## 2019-09-20 DIAGNOSIS — G479 Sleep disorder, unspecified: Secondary | ICD-10-CM | POA: Diagnosis not present

## 2019-09-20 DIAGNOSIS — Z7901 Long term (current) use of anticoagulants: Secondary | ICD-10-CM | POA: Diagnosis not present

## 2019-09-20 NOTE — Telephone Encounter (Signed)
Received call from patient's spouse, [name] [facility]). [name] stated that since patient started [facility], he has been experiencing confusion and trouble sleeping.  [name] is questioning if these are side effects to [place].   Aaron Edelman, please advise. Thanks

## 2019-09-20 NOTE — Telephone Encounter (Signed)
Is not a common side effect Breztri.   If patient does not notice a clinical relief from starting Breztri -okay to hold to see if symptoms improve.  Wyn Quaker, FNP

## 2019-09-20 NOTE — Progress Notes (Signed)
Virtual Visit via Video Note  This visit type was conducted due to national recommendations for restrictions regarding the COVID-19 pandemic (e.g. social distancing).  This format is felt to be most appropriate for this patient at this time.  All issues noted in this document were discussed and addressed.  No physical exam was performed (except for noted visual exam findings with Video Visits).   I connected with@ on 09/21/19 at  1:30 PM EDT by a video enabled telemedicine application or telephone and verified that I am speaking with the correct person using two identifiers. Location patient: home Location provider: work or home office Persons participating in the virtual visit: patient, provider  I discussed the limitations, risks, security and privacy concerns of performing an evaluation and management service by telephone and the availability of in person appointments. I also discussed with the patient that there may be a patient responsible charge related to this service. The patient expressed understanding and agreed to proceed.   Reason for visit: Confusion and not sleeping well  HPI: Alexander Barton is a 69 year old male with PMH of hypertension, HLD, aortic valve replacement 2008 on warfarin, previous CVA, chronic thoracoabdominal aortic dissection with aortic stent in place, cardiomyopathy, COPD, T2DM, HTN, BPH, GERD, acute gallstone pancreatitis complicated by large pseudocyst, status post drainage procedure.  In April, he developed abdominal pain, and a CT abdomen pelvis revealed a large complex hemorrhagic pseudocyst in the tail of the pancreas.  The etiology of his pancreatitis was attributed to gallstones.  He was treated with antibiotics for superimposed infection. A cystgastrostomy was performed 07/28/2019 with additional necrosectomy on 08/05/2019.  He reported on 07/26/2019 onset of some memory issues with associated blurry vision in the right eye only and he had some headaches over the  last week.  The symptoms he thought were reminiscent of his past TIA, but was not concerned and he attributed to the anesthesia he received on 6/24 and 7/2,  although his wife pointed out his symptoms started 2 days before.  His blood pressure was stable.  He did speak to his eye doctor on the telephone and was told his blurry vision was related to gel in the back of his eye.  He was given cardiac clearance to proceed to an EGD for cyst gastrostomy stent pull on 08/15/2019.  There are plans for cholecystectomy in the near future.   Today, he is seen on virtual visit with his wife providing the history.She is concerned about his progressive memory loss about current every day things.  He has not slept well over the last 3 nights and is more confused during the day. He tosses and turns at night.   He reports today, " I feel physically  fine, but my mind is not working properly."  "I don't know  whether I am in an alterative universe not." " I have no concept of time."   He does reports he has had some intermittent headaches, mild, and wife was unaware.  He is unable to give any more information about the headaches as far as location, quality, severity or frequency.  He denies any visual changes now but does refer back to the headache and vision changes-"spots" he was seeing  in April which he discussed with his eye doctor on a prolonged phone call. He has not had a follow-up eye exam.  He reports no vision changes and " I see OK with or without my eyeglasses."  He does try to read, but the words do  not make sense to him.  He reports longtime history of numbness and tingling in his fingers, hands and legs.  He has noted no weakness. The last time he was at baseline cognition was prior to gallbladder attack.    His wife attributes his confusion to the sedation and GI procedures for his gallstone pancreatitis and pancreatic cyst. She reports that anesthesia always does a number on him.  She thinks he may have had  mini strokes because he had several episodes where he had to bridge from warfarin to Lovenox and his INR was subtherapeutic.He has been referred to [facility] but was given an appointment in November.     He had follow-up with pulmonology on 08/26/2019 and he was given a new inhaler Breztri during this timeframe when his confusion was worsening.  He had pulmonary function studies that looked good, and he was told today that he may stop the inhaler if it is potentially causing confusion.    Review of systems: He denies any fevers or chills, chest pain, pressure heaviness or tightness.  No shortness of breath or DOE.  No abdominal pain, nausea vomiting and having no difficulty with appetite and diet.  He has lost weight and gained it back. He is able to go to the gym and walk for exercise, go shopping, visit family and friends, but when he is not being kept busy by his wife, he is sitting in his chair. He has had 1 fall, no injury.  He has had no new medications other than the Brezti.  He has been on a statin experimental drug.  Received both Moderna Covid vaccines.  Labs: 06/29/19: Hgb 10.7, TSH 1.88, gluc 150, Na 137, Bun 19/Cr 1.14. ALT10, T bili 0.7.   06/05/19: B12 1748, iron 37, iron sat 28, TIBC 133,  ferritin 844  09/19/19: INR: 2.0  Wt Readings from Last 3 Encounters:  09/20/19 190 lb (86.2 kg)  08/26/19 195 lb (88.5 kg)  06/29/19 195 lb (88.5 kg)   BP Readings from Last 3 Encounters:  09/20/19 112/86  08/26/19 (!) 112/60  06/29/19 110/70   Past Medical History:  Diagnosis Date  . Aortic aneurysm, thoracic ([place])   . Aortic valve disease   . Arthritis   . BPH (benign prostatic hyperplasia)   . COPD (chronic obstructive pulmonary disease) ([place])   . GERD (gastroesophageal reflux disease)   . HTN (hypertension)   . Hypercholesterolemia   . Stroke ([place])   . TIA (transient ischemic attack)     Past Surgical History:  Procedure Laterality Date  . ACHILLES TENDON REPAIR    .  AORTIC VALVE REPLACEMENT     #23 On-X valve conduit  . ASCENDING AORTIC ANEURYSM REPAIR    . BACK SURGERY    . FOOT SURGERY      Family History  Problem Relation Age of Onset  . Alzheimer's disease Father   . Diabetes Father        age onset DM  . Hypertension Sister   . Hypertension Brother   . Diabetes Mother   . Diabetes Sister   . Colon cancer Neg Hx     SOCIAL HX: Non-smoker.   Current Outpatient Medications:  .  acetaminophen (TYLENOL) 500 MG tablet, Take 1,000 mg by mouth every 6 (six) hours as needed for mild pain., Disp: , Rfl:  .  albuterol (VENTOLIN HFA) 108 (90 Base) MCG/ACT inhaler, Inhale 2 puffs into the lungs every 4 (four) hours as needed for wheezing  or shortness of breath., Disp: 18 g, Rfl: 1 .  blood glucose meter kit and supplies KIT, Dispense based on patient and insurance preference. Use twice daily. (FOR ICD-10 E11.9)., Disp: 1 each, Rfl: 0 .  Budeson-Glycopyrrol-Formoterol (BREZTRI AEROSPHERE) 160-9-4.8 MCG/ACT AERO, Inhale 2 puffs into the lungs in the morning and at bedtime., Disp: 10.7 g, Rfl: 3 .  clobetasol ointment (TEMOVATE) 5.10 %, Apply 1 application topically daily as needed for rash. , Disp: , Rfl:  .  enoxaparin (LOVENOX) 80 MG/0.8ML injection, Inject 0.8 mLs (80 mg total) into the skin every 12 (twelve) hours., Disp: 11.2 mL, Rfl: 0 .  glucose blood test strip, Use twice daily E11.9, Disp: 100 each, Rfl: 1 .  Investigational - Study Medication, Take 1 tablet by mouth daily. Study name: [id number] Additional study details: cholesterol [facility] [phone], Disp: , Rfl:  .  OVER THE COUNTER MEDICATION, Take 1 capsule by mouth daily. Dotera Digestive Supplement, Disp: , Rfl:  .  polyethylene glycol (MIRALAX / GLYCOLAX) 17 g packet, Take 17 g by mouth daily., Disp: , Rfl:  .  senna (SENOKOT) 8.6 MG TABS tablet, Take 2 tablets by mouth in the morning and at bedtime., Disp: , Rfl:  .  tamsulosin (FLOMAX) 0.4 MG CAPS capsule, TAKE 2 CAPSULES BY  MOUTH EVERY DAY (Patient taking differently: Take 0.4 mg by mouth in the morning and at bedtime. ), Disp: 174 capsule, Rfl: 1 .  warfarin (COUMADIN) 5 MG tablet, TAKE 1 TABLET DAILY AS DIRECTED WITH 2 MG TABLET, Disp: 90 tablet, Rfl: 1  EXAM:  VITALS per patient if applicable:Wt 190 lbs, 112/86, 97.3, blood sugars 131  GENERAL: alert, oriented, appears well and in no acute distress  HEENT: atraumatic, conjunctiva clear, no obvious abnormalities on inspection of external nose and ears  NECK: normal movements of the head and neck  LUNGS: on inspection no signs of respiratory distress, breathing rate appears normal, no obvious gross SOB, gasping or wheezing  CV: no obvious cyanosis  MS: moves all visible extremities without noticeable abnormality  PSYCH/NEURO: pleasant and cooperative, Good eye contact, fluent speech and clear, some trouble expressing thoughts and seems to get easily frustrates, affect is mildly depressed and slight tearfulness -when talking about his mental status- quickly regains composure. Thought processes at times wandering and defers to his wife for history of present illness details. He is not repeating himself, answers questions promptly.    ASSESSMENT AND PLAN:  Discussed the following assessment and plan:  Confusion - Plan: TSH, Comprehensive metabolic panel, CBC with Differential/Platelet, B12 and Folate Panel, Vitamin B1, VITAMIN D 25 Hydroxy (Vit-D Deficiency, Fractures), Ambulatory referral to Neurology  Long term current use of anticoagulant therapy - Plan: Ambulatory referral to Neurology  Tension-type headache, not intractable, unspecified chronicity pattern - Plan: Ambulatory referral to Neurology  Sleep disturbance  Confusion He is reporting confusion that according to chart review seem to started sometime in June or May. His wife perceives that it is been progressive and worse more recently.  He has had mild headaches, visual disturbance, inability  to process words when he is reading, memory issues, and the feeling that he does not have a concept of time.  He is very articulate today.  Speech is clear and he is well-appearing, with no obvious physical signs of CVA. He is exercising by walking almost daily.  He has a history of CVA.  He is on chronic anticoagulation with recent interruption with warfarin to Lovenox bridging, variations in INR,  and IV sedation.  New medications include a new inhaler, Brezti.  His wife  believes his symptoms may have worsened when he started the inhaler. His pulmonologist was contacted and said  while confusion is not a common side effect, the patient may discontinue it since his PFTs  were unremarkable.  Plan: Check labs to rule out metabolic cause, move up the neurology appointment to urgent referral and obtain brain MRI scan.  Follow-up office visit with his primary care provider, Dr. [name] and I  will discuss this case with him.  Sleep disturbance He reports tossing and turning at night.  He has had multiple GI procedures since June, and is anticipating gallbladder surgery.  He appears slightly depressed today, and worried about himself.  He was advised to try melatonin - begin 3 mg -6 mg after supper as it is not a sedative and is safe. He found it helpful in the distant past.  Advised to avoid caffeine. Reviewed sleep hygiene practices.  He does not have a history of sleep apnea but can be considered in the future if this persists.  I discussed the assessment and treatment plan with the patient. The patient was provided an opportunity to ask questions and all were answered. The patient agreed with the plan and demonstrated an understanding of the instructions.   The patient was advised to call back or seek an in-person evaluation if the symptoms worsen or if the condition fails to improve as anticipated.  I provided 30 minutes of non-face-to-face time during this encounter.  Denice Paradise, NP Adult Nurse  Practitioner [place] [phone]

## 2019-09-20 NOTE — Telephone Encounter (Signed)
Patient is aware of below message/recommendaitons.  Patient voiced his understanding and had no further questions.  Nothing further is needed.

## 2019-09-21 ENCOUNTER — Telehealth: Payer: Self-pay | Admitting: Nurse Practitioner

## 2019-09-21 ENCOUNTER — Encounter: Payer: Self-pay | Admitting: Nurse Practitioner

## 2019-09-21 ENCOUNTER — Other Ambulatory Visit: Payer: Self-pay

## 2019-09-21 ENCOUNTER — Ambulatory Visit
Admission: RE | Admit: 2019-09-21 | Discharge: 2019-09-21 | Disposition: A | Discharge status: Home / Self Care | Payer: Medicare PPO | Source: Ambulatory Visit | Attending: Nurse Practitioner | Admitting: Nurse Practitioner

## 2019-09-21 ENCOUNTER — Other Ambulatory Visit
Admission: RE | Admit: 2019-09-21 | Discharge: 2019-09-21 | Disposition: A | Discharge status: Home / Self Care | Payer: Medicare PPO | Source: Home / Self Care | Attending: Nurse Practitioner | Admitting: Nurse Practitioner

## 2019-09-21 DIAGNOSIS — G479 Sleep disorder, unspecified: Secondary | ICD-10-CM | POA: Insufficient documentation

## 2019-09-21 DIAGNOSIS — R4182 Altered mental status, unspecified: Secondary | ICD-10-CM

## 2019-09-21 DIAGNOSIS — K851 Biliary acute pancreatitis without necrosis or infection: Secondary | ICD-10-CM | POA: Insufficient documentation

## 2019-09-21 DIAGNOSIS — R410 Disorientation, unspecified: Secondary | ICD-10-CM | POA: Insufficient documentation

## 2019-09-21 DIAGNOSIS — G9389 Other specified disorders of brain: Secondary | ICD-10-CM | POA: Diagnosis not present

## 2019-09-21 DIAGNOSIS — I6782 Cerebral ischemia: Secondary | ICD-10-CM | POA: Diagnosis not present

## 2019-09-21 DIAGNOSIS — G319 Degenerative disease of nervous system, unspecified: Secondary | ICD-10-CM | POA: Diagnosis not present

## 2019-09-21 DIAGNOSIS — I616 Nontraumatic intracerebral hemorrhage, multiple localized: Secondary | ICD-10-CM | POA: Diagnosis not present

## 2019-09-21 LAB — CBC WITH DIFFERENTIAL/PLATELET
Abs Immature Granulocytes: 0.01 10*3/uL (ref 0.00–0.07)
Basophils Absolute: 0 10*3/uL (ref 0.0–0.1)
Basophils Relative: 0 %
Eosinophils Absolute: 0.1 10*3/uL (ref 0.0–0.5)
Eosinophils Relative: 2 %
HCT: 35.3 % — ABNORMAL LOW (ref 39.0–52.0)
Hemoglobin: 11.9 g/dL — ABNORMAL LOW (ref 13.0–17.0)
Immature Granulocytes: 0 %
Lymphocytes Relative: 34 %
Lymphs Abs: 1.8 10*3/uL (ref 0.7–4.0)
MCH: 30.1 pg (ref 26.0–34.0)
MCHC: 33.7 g/dL (ref 30.0–36.0)
MCV: 89.1 fL (ref 80.0–100.0)
Monocytes Absolute: 0.5 10*3/uL (ref 0.1–1.0)
Monocytes Relative: 9 %
Neutro Abs: 2.8 10*3/uL (ref 1.7–7.7)
Neutrophils Relative %: 55 %
Platelets: 182 10*3/uL (ref 150–400)
RBC: 3.96 MIL/uL — ABNORMAL LOW (ref 4.22–5.81)
RDW: 14.6 % (ref 11.5–15.5)
WBC: 5.2 10*3/uL (ref 4.0–10.5)
nRBC: 0 % (ref 0.0–0.2)

## 2019-09-21 LAB — COMPREHENSIVE METABOLIC PANEL
ALT: 12 U/L (ref 0–44)
AST: 21 U/L (ref 15–41)
Albumin: 4 g/dL (ref 3.5–5.0)
Alkaline Phosphatase: 45 U/L (ref 38–126)
Anion gap: 10 (ref 5–15)
BUN: 19 mg/dL (ref 8–23)
CO2: 26 mmol/L (ref 22–32)
Calcium: 9.3 mg/dL (ref 8.9–10.3)
Chloride: 104 mmol/L (ref 98–111)
Creatinine, Ser: 0.32 mg/dL — ABNORMAL LOW (ref 0.61–1.24)
GFR calc Af Amer: >60 mL/min (ref >60)
GFR calc non Af Amer: >60 mL/min (ref >60)
Glucose, Bld: 129 mg/dL — ABNORMAL HIGH (ref 70–99)
Potassium: 4 mmol/L (ref 3.5–5.1)
Sodium: 140 mmol/L (ref 135–145)
Total Bilirubin: 0.7 mg/dL (ref 0.3–1.2)
Total Protein: 7.4 g/dL (ref 6.5–8.1)

## 2019-09-21 LAB — VITAMIN D 25 HYDROXY (VIT D DEFICIENCY, FRACTURES): Vit D, 25-Hydroxy: 36.15 ng/mL (ref 30–100)

## 2019-09-21 LAB — TSH: TSH: 1.536 u[IU]/mL (ref 0.350–4.500)

## 2019-09-21 NOTE — Telephone Encounter (Signed)
Alexander Barton please advise on dx. Thanks

## 2019-09-21 NOTE — Telephone Encounter (Signed)
Patient did not want to schedule labs with me when I called as they are waiting for the MRI appt first. Wife states they will call back to schedule lab appt.

## 2019-09-21 NOTE — Telephone Encounter (Addendum)
Please set up a lab appt. today. He has been given his MRI appt.

## 2019-09-21 NOTE — Assessment & Plan Note (Addendum)
He is reporting confusion that according to chart review seem to started sometime in June or May. His wife perceives that it is been progressive and worse more recently.  He has had mild headaches, visual disturbance, inability to process words when he is reading, memory issues, and the feeling that he does not have a concept of time.  He is very articulate today.  Speech is clear and he is well-appearing, with no obvious physical signs of CVA. He is exercising by walking almost daily.  He has a history of CVA.  He is on chronic anticoagulation with recent interruption with warfarin to Lovenox bridging, variations in INR, and IV sedation.  New medications include a new inhaler, Brezti.  His wife  believes his symptoms may have worsened when he started the inhaler. His pulmonologist was contacted and said  while confusion is not a common side effect, the patient may discontinue it since his PFTs  were unremarkable.  Plan: Check labs to rule out metabolic cause, move up the neurology appointment to urgent referral and obtain brain MRI scan.  Follow-up office visit with his primary care provider, Dr. [name] and I  will discuss this case with him.

## 2019-09-21 NOTE — Assessment & Plan Note (Signed)
He reports tossing and turning at night.  He has had multiple GI procedures since June, and is anticipating gallbladder surgery.  He appears slightly depressed today, and worried about himself.  He was advised to try melatonin - begin 3 mg -6 mg after supper as it is not a sedative and is safe. He found it helpful in the distant past.  Advised to avoid caffeine. Reviewed sleep hygiene practices.  He does not have a history of sleep apnea but can be considered in the future if this persists.

## 2019-09-21 NOTE — Telephone Encounter (Signed)
Chart review.

## 2019-09-21 NOTE — Telephone Encounter (Signed)
Great question.  Let me further clarify. Since patient's pulmonary function test does not show a formal obstruction.  Patient is also never smoker and his alpha-1 is normal.  This is all reassuring.  I would classify patients mild chronic lung disease in keeping with asthma.  This is also supported with elevated eosinophil counts on his CBC with differential earlier this year.  I do not believe the patient has COPD.  Patient likely has a mild case of chronic obstructive asthma.  They are treated very similarly.  Hopefully that helps, if the patient has additional questions please feel free to let me know.  Wyn Quaker, FNP

## 2019-09-22 ENCOUNTER — Emergency Department (HOSPITAL_COMMUNITY)
Admission: EM | Admit: 2019-09-22 | Discharge: 2019-09-27 | Disposition: A | Discharge status: Home / Self Care | Payer: Medicare PPO | Attending: Emergency Medicine | Admitting: Emergency Medicine

## 2019-09-22 ENCOUNTER — Emergency Department (HOSPITAL_COMMUNITY): Payer: Medicare PPO

## 2019-09-22 ENCOUNTER — Encounter: Payer: Self-pay | Admitting: Neurology

## 2019-09-22 ENCOUNTER — Other Ambulatory Visit: Payer: Self-pay

## 2019-09-22 ENCOUNTER — Ambulatory Visit: Payer: Medicare PPO | Admitting: Neurology

## 2019-09-22 VITALS — BP 154/78 | HR 97 | Ht 73.0 in | Wt 195.0 lb

## 2019-09-22 DIAGNOSIS — Z79899 Other long term (current) drug therapy: Secondary | ICD-10-CM | POA: Insufficient documentation

## 2019-09-22 DIAGNOSIS — Z7901 Long term (current) use of anticoagulants: Secondary | ICD-10-CM | POA: Insufficient documentation

## 2019-09-22 DIAGNOSIS — F419 Anxiety disorder, unspecified: Secondary | ICD-10-CM | POA: Diagnosis not present

## 2019-09-22 DIAGNOSIS — E119 Type 2 diabetes mellitus without complications: Secondary | ICD-10-CM | POA: Diagnosis not present

## 2019-09-22 DIAGNOSIS — J449 Chronic obstructive pulmonary disease, unspecified: Secondary | ICD-10-CM | POA: Diagnosis not present

## 2019-09-22 DIAGNOSIS — R4182 Altered mental status, unspecified: Secondary | ICD-10-CM | POA: Insufficient documentation

## 2019-09-22 DIAGNOSIS — F23 Brief psychotic disorder: Secondary | ICD-10-CM | POA: Insufficient documentation

## 2019-09-22 DIAGNOSIS — J45909 Unspecified asthma, uncomplicated: Secondary | ICD-10-CM | POA: Diagnosis not present

## 2019-09-22 DIAGNOSIS — I10 Essential (primary) hypertension: Secondary | ICD-10-CM | POA: Insufficient documentation

## 2019-09-22 DIAGNOSIS — R410 Disorientation, unspecified: Secondary | ICD-10-CM

## 2019-09-22 DIAGNOSIS — Z20822 Contact with and (suspected) exposure to covid-19: Secondary | ICD-10-CM | POA: Diagnosis not present

## 2019-09-22 DIAGNOSIS — F29 Unspecified psychosis not due to a substance or known physiological condition: Secondary | ICD-10-CM | POA: Diagnosis not present

## 2019-09-22 LAB — COMPREHENSIVE METABOLIC PANEL
ALT: 16 U/L (ref 0–44)
AST: 22 U/L (ref 15–41)
Albumin: 3.9 g/dL (ref 3.5–5.0)
Alkaline Phosphatase: 61 U/L (ref 38–126)
Anion gap: 10 (ref 5–15)
BUN: 19 mg/dL (ref 8–23)
CO2: 24 mmol/L (ref 22–32)
Calcium: 9.4 mg/dL (ref 8.9–10.3)
Chloride: 105 mmol/L (ref 98–111)
Creatinine, Ser: 1.16 mg/dL (ref 0.61–1.24)
GFR calc Af Amer: >60 mL/min (ref >60)
GFR calc non Af Amer: >60 mL/min (ref >60)
Glucose, Bld: 125 mg/dL — ABNORMAL HIGH (ref 70–99)
Potassium: 3.9 mmol/L (ref 3.5–5.1)
Sodium: 139 mmol/L (ref 135–145)
Total Bilirubin: 0.7 mg/dL (ref 0.3–1.2)
Total Protein: 7.1 g/dL (ref 6.5–8.1)

## 2019-09-22 LAB — ACETAMINOPHEN LEVEL: Acetaminophen (Tylenol), Serum: <10 ug/mL — ABNORMAL LOW (ref 10–30)

## 2019-09-22 LAB — CBC WITH DIFFERENTIAL/PLATELET
Abs Immature Granulocytes: 0.01 10*3/uL (ref 0.00–0.07)
Basophils Absolute: 0 10*3/uL (ref 0.0–0.1)
Basophils Relative: 1 %
Eosinophils Absolute: 0.1 10*3/uL (ref 0.0–0.5)
Eosinophils Relative: 2 %
HCT: 37.2 % — ABNORMAL LOW (ref 39.0–52.0)
Hemoglobin: 12.3 g/dL — ABNORMAL LOW (ref 13.0–17.0)
Immature Granulocytes: 0 %
Lymphocytes Relative: 34 %
Lymphs Abs: 2 10*3/uL (ref 0.7–4.0)
MCH: 30.1 pg (ref 26.0–34.0)
MCHC: 33.1 g/dL (ref 30.0–36.0)
MCV: 91.2 fL (ref 80.0–100.0)
Monocytes Absolute: 0.6 10*3/uL (ref 0.1–1.0)
Monocytes Relative: 10 %
Neutro Abs: 3.2 10*3/uL (ref 1.7–7.7)
Neutrophils Relative %: 53 %
Platelets: 195 10*3/uL (ref 150–400)
RBC: 4.08 MIL/uL — ABNORMAL LOW (ref 4.22–5.81)
RDW: 14.6 % (ref 11.5–15.5)
WBC: 6 10*3/uL (ref 4.0–10.5)
nRBC: 0 % (ref 0.0–0.2)

## 2019-09-22 LAB — ETHANOL: Alcohol, Ethyl (B): <10 mg/dL (ref <10)

## 2019-09-22 LAB — RAPID URINE DRUG SCREEN, HOSP PERFORMED
Amphetamines: NOT DETECTED
Barbiturates: NOT DETECTED
Benzodiazepines: NOT DETECTED
Cocaine: NOT DETECTED
Opiates: NOT DETECTED
Tetrahydrocannabinol: NOT DETECTED

## 2019-09-22 LAB — URINALYSIS, ROUTINE W REFLEX MICROSCOPIC
Bilirubin Urine: NEGATIVE
Glucose, UA: NEGATIVE mg/dL
Hgb urine dipstick: NEGATIVE
Ketones, ur: NEGATIVE mg/dL
Leukocytes,Ua: NEGATIVE
Nitrite: NEGATIVE
Protein, ur: NEGATIVE mg/dL
Specific Gravity, Urine: 1.009 (ref 1.005–1.030)
pH: 6 (ref 5.0–8.0)

## 2019-09-22 LAB — SALICYLATE LEVEL: Salicylate Lvl: <7 mg/dL — ABNORMAL LOW (ref 7.0–30.0)

## 2019-09-22 LAB — SARS CORONAVIRUS 2 BY RT PCR (HOSPITAL ORDER, PERFORMED IN ~~LOC~~ HOSPITAL LAB): SARS Coronavirus 2: NEGATIVE

## 2019-09-22 LAB — PROTIME-INR
INR: 2.1 — ABNORMAL HIGH (ref 0.8–1.2)
Prothrombin Time: 23 seconds — ABNORMAL HIGH (ref 11.4–15.2)

## 2019-09-22 MED ORDER — HALOPERIDOL LACTATE 5 MG/ML IJ SOLN: 5.0000 mg | Freq: Once | INTRAMUSCULAR | Status: DC

## 2019-09-22 MED ORDER — WARFARIN SODIUM 5 MG PO TABS
10.0000 mg | ORAL_TABLET | ORAL | Status: DC
  Administered 2019-09-23 (×2): 10 mg via ORAL
  Filled 2019-09-22 (×2): qty 2

## 2019-09-22 MED ORDER — TAMSULOSIN HCL 0.4 MG PO CAPS
0.4000 mg | ORAL_CAPSULE | Freq: Every day | ORAL | Status: DC
  Administered 2019-09-23 – 2019-09-27 (×5): 0.4 mg via ORAL
  Filled 2019-09-22 (×6): qty 1

## 2019-09-22 MED ORDER — HALOPERIDOL LACTATE 5 MG/ML IJ SOLN
5.0000 mg | Freq: Once | INTRAMUSCULAR | Status: AC
  Administered 2019-09-22: 5 mg via INTRAMUSCULAR

## 2019-09-22 MED ORDER — HALOPERIDOL LACTATE 5 MG/ML IJ SOLN
5.0000 mg | Freq: Once | INTRAMUSCULAR | Status: DC
  Filled 2019-09-22: qty 1

## 2019-09-22 MED ORDER — WARFARIN SODIUM 2 MG PO TABS
7.0000 mg | ORAL_TABLET | ORAL | Status: DC
  Administered 2019-09-24: 7 mg via ORAL
  Filled 2019-09-22: qty 3.5

## 2019-09-22 MED ORDER — WARFARIN - PHYSICIAN DOSING INPATIENT: Freq: Every day | Status: DC

## 2019-09-22 MED ORDER — MOMETASONE FURO-FORMOTEROL FUM 200-5 MCG/ACT IN AERO
2.0000 | INHALATION_SPRAY | Freq: Two times a day (BID) | RESPIRATORY_TRACT | Status: DC
  Administered 2019-09-23 – 2019-09-27 (×5): 2 via RESPIRATORY_TRACT
  Filled 2019-09-22: qty 8.8

## 2019-09-22 MED ORDER — ALBUTEROL SULFATE HFA 108 (90 BASE) MCG/ACT IN AERS
2.0000 | INHALATION_SPRAY | RESPIRATORY_TRACT | Status: DC | PRN
  Administered 2019-09-27: 2 via RESPIRATORY_TRACT
  Filled 2019-09-22: qty 6.7
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
## 2019-09-22 NOTE — Patient Instructions (Signed)
Delirium Delirium is a state of mental confusion. It comes on quickly and causes significant changes in a person's thinking and behavior. People with delirium usually have trouble paying attention to what is going on or knowing where they are. They may become very withdrawn or very emotional and unable to sit still. They may even see or feel things that are not there (hallucinations). Delirium is a sign of a serious underlying medical condition. What are the causes? Delirium occurs when something suddenly affects the signals that the brain sends out. Brain signals can be affected by anything that puts severe stress on the body and brain and causes brain chemicals to be out of balance. The most common causes of delirium include:  Infections. These may be bacterial, viral, fungal, or protozoal.  Medicines. These include many over-the-counter and prescription medicines.  Recreational drugs.  Substance withdrawal. This occurs with sudden discontinuation of alcohol, certain medicines, or recreational drugs.  Surgery and anesthesia.  Sudden vascular events, such as stroke and brain hemorrhage.  Other brain disorders, such as migraines, tumors, seizures, and physical head trauma.  Metabolic disorders, such as kidney or liver failure.  Low blood oxygen (anoxia). This may occur with lung disease, cardiac arrest, or carbon monoxide poisoning.  Hormone imbalances (endocrinopathies), such as an overactive thyroid (hyperthyroidism) or underactive thyroid (hypothyroidism).  Vitamin deficiencies. What increases the risk? The following factors may make someone more likely to develop this condition.  Being a child.  Being an older person.  Living alone.  Having vision loss or hearing loss.  Having an existing brain disease, such as dementia.  Having long-lasting (chronic) medical conditions, such as heart disease.  Being hospitalized for long periods of time. What are the signs or  symptoms? Delirium starts with a sudden change in a person's thinking or behavior. Symptoms include:  Not being able to stay awake (drowsiness) or pay attention.  Being confused about places, time, and people.  Forgetfulness.  Having extreme energy levels. These may be low or high.  Changes in sleep patterns.  Extreme mood swings, such as sudden anger or anxiety.  Focusing on things or ideas that are not important.  Rambling and senseless talking.  Difficulty speaking, understanding speech, or both.  Hallucinations.  Tremor or unsteady gait. Symptoms come and go (fluctuate) over time, and they are often worse at the end of the day. How is this diagnosed? People with delirium may not realize that they have the condition. Often, a family member or health care provider is the first person to notice the changes. This condition may be diagnosed based on a physical exam, health history, and tests.  The health care provider will obtain a detailed history. This may include questions about: ? Current symptoms. ? Medical issues. ? Medicines. ? Recreational drug use.  The health care provider will perform a mental status examination by: ? Asking questions to check for confusion. ? Watching for abnormal behavior.  The health care provider may also order lab tests or additional studies to determine the cause of the delirium. How is this treated? Treatment of delirium depends on the cause and severity. Delirium usually goes away within days or weeks of treating the underlying cause. In the meantime, do not leave the person alone because he or she may accidentally cause self-harm. This condition may be treated with supportive care, such as:  Increased light during the day and decreased light at night.  Low noise level.  Uninterrupted sleep.  A regular daily schedule.    Clocks and calendars to help with orientation.  Familiar objects, including the person's pictures and  clothing.  Frequent visits from familiar family and friends.  A healthy diet.  Gentle exercise. In more severe cases of delirium, medicine may be prescribed to help the person keep calm and think more clearly. Follow these instructions at home:  Continue supportive care as told by a health care provider.  Over-the-counter and prescription medicines should be taken only as told by a health care provider.  Ask a health care provider before using herbs or supplements.  Do not use alcohol or recreational drugs.  Keep all follow-up visits as told by a health care provider. This is important. Contact a health care provider if:  Symptoms do not get better or they become worse.  New symptoms of delirium develop.  Caring for the person at home does not seem safe.  Eating, drinking, or communicating stops.  There are side effects of medicines, such as changes in sleep patterns, dizziness, weight gain, restlessness, movement changes, or tremors. Get help right away if:  Serious thoughts occur about self-harm or about hurting others.  There are serious side effects of medicine, such as: ? Swelling of the face, lips, tongue, or throat. ? Fever, confusion, muscle spasms, or seizures. Summary  Delirium is a state of mental confusion. It comes on quickly and causes significant changes in a person's thinking and behavior.  Delirium is a sign of a serious underlying medical condition.  Certain medical conditions or a long hospital stay may increase the risk of developing delirium.  Treatment of delirium involves treating the underlying cause and providing supportive treatments, such as a calm and familiar environment. This information is not intended to replace advice given to you by your health care provider. Make sure you discuss any questions you have with your health care provider. Document Revised: 09/10/2017 Document Reviewed: 09/10/2017 Elsevier Patient Education  2020 [employer]  

## 2019-09-22 NOTE — ED Triage Notes (Signed)
Pt bib ems from [facility] with delirium X3 days ago. Pt with recent procedures with anesthesia. Pt also has hx TIA. EMS reports pt has not slept in 3 days.  138/82 HR 76 98% RA CBG 151

## 2019-09-22 NOTE — Telephone Encounter (Signed)
He had the labs done already. Thx.

## 2019-09-22 NOTE — Progress Notes (Signed)
NEUROLOGY and SLEEP MEDICINE     Provider:  Larey Seat, MD  Primary Care Physician:  Leone Haven, MD [address] [place] [address]     Referring Provider: Leone Haven, Md [address] [place],  [place] [address]          Chief Complaint according to patient   Patient presents with:    . New Patient (Initial Visit)           HISTORY OF PRESENT ILLNESS:  Alexander Barton is a 69 y.o. year old White or Caucasian male patient seen here as a referral on 09/22/2019 from urgent request  for a neurological evaluation-   Chief concern : pt with wife and daughter. he has been having major confusion concerns. This has been off and on for about 3 days. he has had 3 major  surgeries in which he had to be put under general anesthesia 3 different times-in early October 2020 he contracted covid- but needed to undergo aortic aneurysm dissection repair in 11-2017,  One week later  a second surgery- was home 3 days, had severe UTI urosepsis. Went back to hospital for 8 days.  He has undergone pancreatic cyst stent and a stent had to me removed on 7-30,  the last time was 7/30/ 21 . the wife state that this morning and things were good. they were planning on going away for their anniversary tonight . she thought that he was ok. - Then when they got here he saw the" rehabilitation sign" and he lost it. He is now logorrhoeic. Completely disoriented and inappropriate.    I have the pleasure of seeing Alexander Barton [id number] 09-22-2019, a right handed White or Caucasian male with acute mental break down-   who  has a past medical history of Aortic aneurysm, thoracic ([place]), Aortic valve disease, Arthritis, BPH (benign prostatic hyperplasia), COPD (chronic obstructive pulmonary disease) ([place]), GERD (gastroesophageal reflux disease), HTN (hypertension), Hypercholesterolemia, Stroke ([place]), and TIA (transient ischemic attack).     Review of Systems: Out of a  complete 14 system review, the patient complains of only the following symptoms, and all other reviewed systems are negative.:   Patient is caught in a logorrhoeic and hyper-religious delusional state , acutely confused.     Social History   Socioeconomic History  . Marital status: Married    Spouse name: Not on file  . Number of children: 2  . Years of education: Not on file  . Highest education level: Not on file  Occupational History  . Occupation: Scientist, clinical (histocompatibility and immunogenetics): [employer]  Tobacco Use  . Smoking status: Never Smoker  . Smokeless tobacco: Never Used  Substance and Sexual Activity  . Alcohol use: No    Alcohol/week: 0.0 standard drinks  . Drug use: No  . Sexual activity: Not Currently  Other Topics Concern  . 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   One son and one daughter   He lives in [place]   One caffeinated beverage daily   08/30/2014   Social Determinants of Health   Financial Resource Strain:   . Difficulty of Paying Living Expenses: Not on file  Food Insecurity:   . Worried About Charity fundraiser in the Last Year: Not on file  . Ran Out of Food in the  Last Year: Not on file  Transportation Needs:   . Lack of Transportation (Medical): Not on file  . Lack of Transportation (Non-Medical): Not on file  Physical Activity:   . Days of Exercise per Week: Not on file  . Minutes of Exercise per Session: Not on file  Stress:   . Feeling of Stress : Not on file  Social Connections:   . Frequency of Communication with Friends and Family: Not on file  . Frequency of Social Gatherings with Friends and Family: Not on file  . Attends Religious Services: Not on file  . Active Member of Clubs or Organizations: Not on file  . Attends Archivist Meetings: Not on file  . Marital Status: Not on file    Family History  Problem Relation Age of Onset    . Alzheimer's disease Father   . Diabetes Father        age onset DM  . Hypertension Sister   . Hypertension Brother   . Diabetes Mother   . Diabetes Sister   . Colon cancer Neg Hx     Past Medical History:  Diagnosis Date  . Aortic aneurysm, thoracic ([place])   . Aortic valve disease   . Arthritis   . BPH (benign prostatic hyperplasia)   . COPD (chronic obstructive pulmonary disease) ([place])   . GERD (gastroesophageal reflux disease)   . HTN (hypertension)   . Hypercholesterolemia   . Stroke ([place])   . TIA (transient ischemic attack)     Past Surgical History:  Procedure Laterality Date  . ACHILLES TENDON REPAIR    . AORTIC VALVE REPLACEMENT     #23 On-X valve conduit  . ASCENDING AORTIC ANEURYSM REPAIR    . BACK SURGERY    . FOOT SURGERY       Current Outpatient Medications on File Prior to Visit  Medication Sig Dispense Refill  . acetaminophen (TYLENOL) 500 MG tablet Take 1,000 mg by mouth every 6 (six) hours as needed for mild pain.    Marland Kitchen albuterol (VENTOLIN HFA) 108 (90 Base) MCG/ACT inhaler Inhale 2 puffs into the lungs every 4 (four) hours as needed for wheezing or shortness of breath. 18 g 1  . blood glucose meter kit and supplies KIT Dispense based on patient and insurance preference. Use twice daily. (FOR ICD-10 E11.9). 1 each 0  . clobetasol ointment (TEMOVATE) 5.03 % Apply 1 application topically daily as needed for rash.     . enoxaparin (LOVENOX) 80 MG/0.8ML injection Inject 0.8 mLs (80 mg total) into the skin every 12 (twelve) hours. 11.2 mL 0  . glucose blood test strip Use twice daily E11.9 100 each 1  . Investigational - Study Medication Take 1 tablet by mouth daily. Study name: [id number] Additional study details: cholesterol [facility] [id number]    . OVER THE COUNTER MEDICATION Take 1 capsule by mouth daily. Dotera Digestive Supplement    . polyethylene glycol (MIRALAX / GLYCOLAX) 17 g packet Take 17 g by mouth daily.    Marland Kitchen senna (SENOKOT) 8.6 MG  TABS tablet Take 2 tablets by mouth in the morning and at bedtime.    . tamsulosin (FLOMAX) 0.4 MG CAPS capsule TAKE 2 CAPSULES BY MOUTH EVERY DAY (Patient taking differently: Take 0.4 mg by mouth in the morning and at bedtime. ) 174 capsule 1  . warfarin (COUMADIN) 5 MG tablet TAKE 1 TABLET DAILY AS DIRECTED WITH 2 MG TABLET 90 tablet 1  . Budeson-Glycopyrrol-Formoterol (BREZTRI AEROSPHERE) 160-9-4.8 MCG/ACT  AERO Inhale 2 puffs into the lungs in the morning and at bedtime. (Patient not taking: Reported on 09/22/2019) 10.7 g 3   No current facility-administered medications on file prior to visit.    Allergies  Allergen Reactions  . Codeine Anaphylaxis  . Gadolinium Derivatives Other (See Comments)    Other Reaction: intense sneezing MRI contrast  . Hydrocodone Anaphylaxis  . Iodinated Diagnostic Agents Other (See Comments)    Uncontrollable sneezing, needs sedative the night before and hour and benadryl before using Other Reaction: sneezing with SOB after prep  . Other     Hydromorphone Other reaction(s): Muscle Pain  . Statins Other (See Comments)    Myalgias  . Hydromorphone Other (See Comments)  . Lipitor [Atorvastatin Calcium] Other (See Comments)    Myalgias   . Seroquel [Quetiapine Fumerate] Other (See Comments)    "bad trip" tongue swell    Physical exam:  Today's Vitals   09/22/19 1540  BP: (!) 154/78  Pulse: 97  Weight: 195 lb (88.5 kg)  Height: _0  (1.854 m)   Body mass index is 25.73 kg/m.   Wt Readings from Last 3 Encounters:  09/22/19 195 lb (88.5 kg)  09/20/19 190 lb (86.2 kg)  08/26/19 195 lb (88.5 kg)     Ht Readings from Last 3 Encounters:  09/22/19 _1  (1.854 m)  09/20/19 _2  (1.854 m)  08/26/19 _3  (1.854 m)      General: The patient is awake, alert and appears not in acute distress. The patient is well groomed. Head: Normocephalic, atraumatic. Neck is supple. Mallampati 2,  Cardiovascular:  Regular rate and cardiac rhythm by pulse,   without distended neck veins. Respiratory: Lungs are clear to auscultation.  Skin:  Without evidence of ankle edema, or rash. Trunk: The patient's posture is erect.   Neurologic exam : The patient is awake and alert, oriented to place and time.   Memory subjective described as intact.  Attention span & concentration ability appears normal.  Speech is fluent,  without  dysarthria, dysphonia or aphasia.  Mood and affect are appropriate.   Cranial nerves: no loss of smell or taste reported  Pupils are equal abut not  reactive to light. Funduscopic exam very difficult , he can not focus. He is singing and constantly moving. Marland Kitchen  Extraocular movements in vertical and horizontal planes appear  intact  No Diplopia. Visual fields by finger perimetry are intact. Hearing was intact to soft voice and finger rubbing.    Facial sensation intact to fine touch.  Facial motor strength is symmetric and tongue and uvula move midline.  Neck ROM : rotation, tilt and flexion extension were normal for age and shoulder shrug was symmetrical.    Motor exam:  Symmetric bulk, tone and ROM.   Normal tone without cog wheeling, symmetric grip strength .   Sensory:  Fine touch, pinprick and vibration were tested  and  normal.  Proprioception tested in the upper extremities was normal.   Coordination: Rapid alternating movements in the fingers/hands were of normal speed.  The Finger-to-nose maneuver was intact without evidence of ataxia, dysmetria or tremor.   Gait and station: Patient could rise unassisted from a seated position, walked without assistive device.  Stance is of normal width/ base and the patient turned with 3 steps.  Toe and heel walk were deferred.  Deep tendon reflexes: in the  upper and lower extremities are symmetric and intact.  Babinski response was  normal  After spending a total time of 60  minutes face to face and additional time for physical and neurologic examination, review of  laboratory studies,  personal review of imaging studies, reports and results of other testing and review of referral information / records as far as provided in visit, I have established the following assessments:  The patient has had a similar change in mental status in 2008, he was placed on Seroquel and went beserk- "like an acid trip" . A second spell was induced by aneasthesia. Loss of short term memory- delirium. He can't follow any commands. According to his family hre has not slept in 3 or 4 days. He is acutely in danger. He cannot return home   The last 4-5 days he appeared less sharp, desoriented. He is very inappropriate in his behavior.   His brain image 09-21-2019 is sugestiveof microbleeds, non acute. Amyloid? This can make him more vulnerable.   I will order Xanax for home- 0.5 mg dose.  1)  recommend emergency admission to Psychiatry.   I would like to thank Leone Haven, MD and Leone Haven, Md [address] [place],  [place] [address] for allowing me to meet with and to take care of this pleasant patient.   Electronically signed by: Larey Seat, MD 09/22/2019 3:58 PM  [facility] Board certified by The [employer] of Sleep Medicine and Diplomate of the [employer] of Sleep Medicine. Board certified In Neurology through the [place], Fellow of the [employer] of Neurology. Medical Director of [facility].

## 2019-09-22 NOTE — Telephone Encounter (Signed)
I saw he had his MRI done yesterday and that you talked to him already. Do you still want him to come in for labs? I called him to schedule and left him a message to call back before I seen all of that.

## 2019-09-23 LAB — VITAMIN B1: Vitamin B1 (Thiamine): 117 nmol/L (ref 66.5–200.0)

## 2019-09-23 NOTE — ED Notes (Signed)
Breakfast ordered 

## 2019-09-23 NOTE — ED Notes (Signed)
TTS in progress 
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
## 2019-09-23 NOTE — Progress Notes (Signed)
Pt meets inpatient criteria per Lindon Romp, NP. Referral information has been sent to the following hospitals for review:  [facility]  [facility]  [facility]  [facility]     Disposition will continue to assist with inpatient placement needs.     Audree Camel, LCSW, [place] Disposition [facility] BHH/TTS [phone]
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
## 2019-09-23 NOTE — BHH Counselor (Signed)
Per Dr. Dwyane Dee this patient meets gero-psych criteria. CSW to seek placement.

## 2019-09-23 NOTE — BH Assessment (Signed)
This counselor informed and was acknowledge by April Oakley, RN of pt's disposition. RN reported, previous EDP has left, so she will inform whomever doing psych.   Darcus Austin, MSW, LCSW-A Triage Specialist [phone]

## 2019-09-23 NOTE — Consult Note (Signed)
After pt asked nurse to turn off a bright light over his bed in the hall in front of [address], I approached pt, identified self as chaplain, and asked if he'd like prayer. He was happy to accept. When I asked if he were [place], he said, Yes, though I probably don't always act as the best Christian.  Provided spiritual/emotional support and prayer. When pt asked me to give him a scripture and also said he'd apporeciate receiving a copy of the New Testament and Psalms, I brought him that and a prayer card with the beginning of the "psalm of ascent" he'd mentioned he especially liked, Psalm 120 ("I called on the Emmons, and he answered me").  When I asked if pt wanted a rosary (he'd been surprised I was a male Scientist, research (life sciences)), he was delighted to have one, said he never had, and asked me how to pray it. He prayed the prayers with me, and I wrote instructions in his New Testament/ Psalms book. He asked if it were appropriate to wear it, and, when I said yes, if he wished, asked me to put it over his head. I did.   Pt explained he belonged to a [employer] tradition that was very close to [employer], and when he got home, he wanted to visit a large [employer] close to where he and his wife live. We discussed a few Reformation-era Catholic/ Protestant disputes. He was pleased I knew that Sheryle Hail (who, after a moral awakening,was largely responsible for [place]'s abolishing slavery in the 19th c.) had recited to himself the longest Psalm in the Bible, Psalm 119, on his trips to and from [place].   Pt is a longtime serious student of the Bible.He recited with me almost by heart (in the language of the original [place]) much of Psalm 8 that I'd chosen as the requested scripture to pray with him (the version we give pts is somewhat modernized). Pt said he'd studied Hebrew. At his request, I prayed with him again before leaving. He seemed restored in spirit.   Rev. Eloise Levels Chaplain

## 2019-09-24 ENCOUNTER — Other Ambulatory Visit: Payer: Self-pay | Admitting: Family Medicine

## 2019-09-24 ENCOUNTER — Other Ambulatory Visit: Payer: Self-pay

## 2019-09-24 DIAGNOSIS — F29 Unspecified psychosis not due to a substance or known physiological condition: Secondary | ICD-10-CM | POA: Diagnosis not present

## 2019-09-24 DIAGNOSIS — R4182 Altered mental status, unspecified: Secondary | ICD-10-CM | POA: Diagnosis not present

## 2019-09-24 LAB — PROTIME-INR
INR: 2.6 — ABNORMAL HIGH (ref 0.8–1.2)
Prothrombin Time: 27.1 seconds — ABNORMAL HIGH (ref 11.4–15.2)

## 2019-09-24 MED ORDER — LORAZEPAM 2 MG/ML IJ SOLN
1.0000 mg | Freq: Once | INTRAMUSCULAR | Status: AC
  Administered 2019-09-24: 1 mg via INTRAMUSCULAR
  Filled 2019-09-24: qty 1

## 2019-09-24 MED ORDER — HALOPERIDOL LACTATE 5 MG/ML IJ SOLN
5.0000 mg | Freq: Once | INTRAMUSCULAR | Status: AC
  Administered 2019-09-24: 5 mg via INTRAMUSCULAR
  Filled 2019-09-24: qty 1

## 2019-09-24 NOTE — ED Provider Notes (Signed)
Patient has been found multiple times wandering around into other patient's rooms and talking/preaching gospel to other patients.  Patient also is becoming irate intermittently hitting his fists on a counter and such.  He is not really redirectable.  This is probably part of his delirium at this time for patient safety and other patients safety and privacy patient will receive chemical restraints.  CRITICAL CARE Performed by: Merrily Pew Total critical care time: 35 minutes Critical care time was exclusive of separately billable procedures and treating other patients. Critical care was necessary to treat or prevent imminent or life-threatening deterioration. Critical care was time spent personally by me on the following activities: development of treatment plan with patient and/or surrogate as well as nursing, discussions with consultants, evaluation of patient's response to treatment, examination of patient, obtaining history from patient or surrogate, ordering and performing treatments and interventions, ordering and review of laboratory studies, ordering and review of radiographic studies, pulse oximetry and re-evaluation of patient's condition.    Savonna Birchmeier, Corene Cornea, MD 09/24/19 [phone]

## 2019-09-24 NOTE — BH Assessment (Signed)
[place] Assessment Progress Note  Clinician attempted to do reassessment of patient.  Spoke with nurse Mitzi Hansen who said that they did not have a room to put patient in at this time.  Pt is also sleeping.  Mitzi Hansen suggested calling back around 23:00.

## 2019-09-25 LAB — PROTIME-INR
INR: 2.7 — ABNORMAL HIGH (ref 0.8–1.2)
Prothrombin Time: 27.8 seconds — ABNORMAL HIGH (ref 11.4–15.2)

## 2019-09-25 MED ORDER — WARFARIN SODIUM 5 MG PO TABS
5.0000 mg | ORAL_TABLET | Freq: Once | ORAL | Status: AC
  Administered 2019-09-25: 5 mg via ORAL
  Filled 2019-09-25 (×2): qty 1

## 2019-09-25 MED ORDER — WARFARIN - PHARMACIST DOSING INPATIENT: Freq: Every day | Status: DC

## 2019-09-25 NOTE — Progress Notes (Signed)
ANTICOAGULATION CONSULT NOTE - Initial Consult  Pharmacy Consult for warfarin Indication: aortic valve disease  Allergies  Allergen Reactions   Codeine Anaphylaxis   Gadolinium Derivatives Other (See Comments)    Intense sneezing from MRI contrast   Hydrocodone Anaphylaxis   Iodinated Diagnostic Agents Shortness Of Breath and Other (See Comments)    Uncontrollable sneezing and "shortness of breath after prep"- needs sedative the night before and hour and benadryl before using   Other Other (See Comments)    Unnamed anesthesia- Patient is in the E.D. possibly due to the effects of it: Delirium, confusion, and sleep deprivation   Statins Other (See Comments)    Muscle pain   Hydromorphone Other (See Comments)    MUSCLE PAIN   Lipitor [Atorvastatin Calcium] Other (See Comments)    Muscle pain and a "knot" appears on his right side, near the ribcage   Seroquel [Quetiapine Fumerate] Swelling and Other (See Comments)    "bad trip" and tongue became swollen    Patient Measurements: Height: 6\' 1"  (185.4 cm) Weight: 88 kg (194 lb 0.1 oz) IBW/kg (Calculated) : 79.9  Vital Signs: Temp: 97.8 F (36.6 C) (08/22 0615) Temp Source: Oral (08/22 0615) BP: 125/70 (08/22 0615) Pulse Rate: 85 (08/22 0615)  Labs: Recent Labs    09/22/19 1831 09/24/19 0700 09/25/19 0500  HGB 12.3*  --   --   HCT 37.2*  --   --   PLT 195  --   --   LABPROT 23.0* 27.1* 27.8*  INR 2.1* 2.6* 2.7*  CREATININE 1.16  --   --     Estimated Creatinine Clearance: 66 mL/min (by C-G formula based on SCr of 1.16 mg/dL).   Medical History: Past Medical History:  Diagnosis Date   Aortic aneurysm, thoracic (HCC)    Aortic valve disease    Arthritis    BPH (benign prostatic hyperplasia)    COPD (chronic obstructive pulmonary disease) (HCC)    GERD (gastroesophageal reflux disease)    HTN (hypertension)    Hypercholesterolemia    Stroke (HCC)    TIA (transient ischemic attack)     Assessment: 51 yom presented to the ED with AMS. He is pending bed placement. He is on chronic warfarin. INR was therapeutic on admission and provider restarted him home regimen. However, his INR has trended up slightly to 2.7 which is above his low goal of 2-2.5. Pharmacy to start managing warfarin dosing today. No bleeding noted.   PTA warfarin regimen: 10mg  daily except 7mg  on Sat  Goal of Therapy:  INR 2-2.5 Monitor platelets by anticoagulation protocol: Yes   Plan:  Warfarin 5mg  PO x 1 today - hopefully can resume home regimen soon Daily INR  Makenzey Nanni, Rande Lawman 09/25/2019,11:14 AM

## 2019-09-25 NOTE — [facility] Counselor (Signed)
Called for cart; attempted reassessment.  No answer.  I have texted the attending nurses and asked them to contact me when the cart is ready.

## 2019-09-25 NOTE — BH Assessment (Signed)
[facility] Assessment Progress Note  Clinician called to do reassessment for patient.  RN Mitzi Hansen said that patient was still asleep.

## 2019-09-25 NOTE — ED Notes (Signed)
TTS complete 

## 2019-09-25 NOTE — [facility] Counselor (Signed)
Pt remains at [facility] under IVC due to confusion, hyperreligiosity, insomnia, and altered mental status.  Pt was reassessed this afternoon.  He sat upright, had good eye contact.  Mood was ''good'' and affect was pleasant.  Pt stated that he was at the hospital because he had become confused but stated that he was no longer confused.  ''I want to go home to my lovely and godly wife.''  Pt denied suicidal ideation, homicidal ideation, and hallucination.  Pt was oriented to time and place.    Attempted to reach Pt's wife [name] for collateral.  Phone call to voicemail.  Barring collateral, recommend continued inpatient.

## 2019-09-26 ENCOUNTER — Telehealth: Payer: Self-pay

## 2019-09-26 DIAGNOSIS — F29 Unspecified psychosis not due to a substance or known physiological condition: Secondary | ICD-10-CM | POA: Diagnosis not present

## 2019-09-26 DIAGNOSIS — R4182 Altered mental status, unspecified: Secondary | ICD-10-CM | POA: Diagnosis not present

## 2019-09-26 LAB — CBG MONITORING, ED: Glucose-Capillary: 135 mg/dL — ABNORMAL HIGH (ref 70–99)

## 2019-09-26 LAB — PROTIME-INR
INR: 2.3 — ABNORMAL HIGH (ref 0.8–1.2)
Prothrombin Time: 24.3 seconds — ABNORMAL HIGH (ref 11.4–15.2)

## 2019-09-26 MED ORDER — ACETAMINOPHEN 325 MG PO TABS
650.0000 mg | ORAL_TABLET | ORAL | Status: DC | PRN
  Administered 2019-09-26 (×2): 650 mg via ORAL
  Filled 2019-09-26 (×2): qty 2

## 2019-09-26 MED ORDER — BENZTROPINE MESYLATE 1 MG PO TABS: 1.0000 mg | ORAL_TABLET | Freq: Three times a day (TID) | ORAL | Status: DC | PRN

## 2019-09-26 MED ORDER — SENNA 8.6 MG PO TABS
2.0000 | ORAL_TABLET | Freq: Every day | ORAL | Status: DC
  Administered 2019-09-26 – 2019-09-27 (×2): 17.2 mg via ORAL
  Filled 2019-09-26 (×2): qty 2

## 2019-09-26 MED ORDER — MELATONIN 5 MG PO TABS
5.0000 mg | ORAL_TABLET | Freq: Every day | ORAL | Status: DC
  Administered 2019-09-26: 5 mg via ORAL
  Filled 2019-09-26: qty 1

## 2019-09-26 MED ORDER — UMECLIDINIUM BROMIDE 62.5 MCG/INH IN AEPB
1.0000 | INHALATION_SPRAY | Freq: Every day | RESPIRATORY_TRACT | Status: DC
  Administered 2019-09-27: 12:00:00 1 via RESPIRATORY_TRACT
  Filled 2019-09-26 (×2): qty 7

## 2019-09-26 MED ORDER — BENZTROPINE MESYLATE 1 MG/ML IJ SOLN
1.0000 mg | Freq: Three times a day (TID) | INTRAMUSCULAR | Status: DC | PRN
  Filled 2019-09-26: qty 1

## 2019-09-26 MED ORDER — HALOPERIDOL LACTATE 5 MG/ML IJ SOLN: 5.0000 mg | Freq: Two times a day (BID) | INTRAMUSCULAR | Status: DC | PRN

## 2019-09-26 MED ORDER — WARFARIN SODIUM 6 MG PO TABS
7.0000 mg | ORAL_TABLET | Freq: Once | ORAL | Status: AC
  Administered 2019-09-26: 7 mg via ORAL
  Filled 2019-09-26: qty 1

## 2019-09-26 MED ORDER — POLYETHYLENE GLYCOL 3350 17 G PO PACK
17.0000 g | PACK | Freq: Every morning | ORAL | Status: DC
  Administered 2019-09-26 – 2019-09-27 (×2): 17 g via ORAL
  Filled 2019-09-26 (×2): qty 1

## 2019-09-26 MED ORDER — HALOPERIDOL 5 MG PO TABS: 5.0000 mg | ORAL_TABLET | Freq: Two times a day (BID) | ORAL | Status: DC | PRN

## 2019-09-26 MED ORDER — HALOPERIDOL 5 MG PO TABS
5.0000 mg | ORAL_TABLET | Freq: Every day | ORAL | Status: DC
  Administered 2019-09-26: 5 mg via ORAL
  Filled 2019-09-26: qty 1

## 2019-09-26 NOTE — Progress Notes (Signed)
Reassessment: Patient seen via telepsych. Chart reviewed. He presented to the ED on 09/22/19 for altered mental status, insomnia, pressured speech, hyperreligiosity. Patient underwent anesthesia for a procedure on 09/02/19 and reportedly had similar reactions to anesthesia in the past.   On assessment today, patient is oriented x 3. He is seen with his wife at the bedside with patient's consent. Session was started over telepsych but due to repeated connectivity issues and unable to reach ED RN, was completed over speaker phone with patient and wife.  He denies current confusion but does report intermittent confusion this morning. He only slept for about 90 minutes overnight. He is hyperverbal at times, tangential, at times providing irrelevant answers. His wife reports his mood has been unstable this morning, which she feels is related to lack of sleep. He denies SI/HI/AVH and does not appear to be responding to internal stimuli.   Patient and his wife both report history of altered mental status the last 4 times patient had anesthesia. His wife states this has taken up to a month to resolve in the past, and he was psychiatrically hospitalized in 2008 for similar symptoms. They are unable to recall prior psychotropic medications other than Seroquel, which they report caused tongue swelling and increased agitation.  Patient initially appears euthymic but becomes increasingly agitated when demanding discharge. His wife expresses safety concerns with patient returning home in current state and still not sleeping, as he has been agitated and unable to be redirected with her at home.  Per initial assessment 09/22/19: Alexander Barton is a 69 y.o. male with history of aortic valve replacement s/p repair, chronic anticoagulation, asthma, TIA/CVA who presents with altered mental status. Per EMR the patient has been experiencing periods of confusion for the past several months but it's been worse in the past  couple of days. He had a video visit on 8/17 with their PCP. His wife expressed concerns about progressive memory loss and not sleeping for 3 days. His wife contributes his symptoms to general anesthesia which he had for treatment of gallstone pancreatitis in June and July. She also thought it could be from a new inhaler which was started for asthma. Pt was referred to neurology. He had a MRI of the brain yesterday and labs which did not show anything acute. He saw neurology today who felt he needed emergent psych eval.  The patient is unable to contribute any history. When asked his name he states "I got married and now it's Alexander Barton". When asked where he is he states "If I told her she wouldn't believe me". His wife states that he had a similar episode in 2008 after anesthesia and he had to be admitted to a psych facility and he subsequently improved.   Disposition: Continue to recommend geripsych admission. Per chart review, patient has received PRN Haldol in the ED, which patient reports was well-tolerated. Will initiate scheduled Haldol and melatonin at bedtime, along with Haldol PRN agitation and EKG to monitor qtc. ED RN and EDP updated.

## 2019-09-26 NOTE — Telephone Encounter (Signed)
Called the patient's wife back and talked with her. She was advising the patient has been in ER on a stretcher at nursing station since Thursday.  She states that it has been horrible, he is not getting any sleep there and there has been no beds for the patient to go to. He had an assessment completed today and he was placed into his own room finally today but that is still in ER. She wanted to just update.

## 2019-09-26 NOTE — ED Notes (Signed)
Patient eating lunch tray.

## 2019-09-26 NOTE — Progress Notes (Signed)
ANTICOAGULATION CONSULT NOTE - Initial Consult  Pharmacy Consult for warfarin Indication: aortic valve disease  Allergies  Allergen Reactions  . Codeine Anaphylaxis  . Gadolinium Derivatives Other (See Comments)    Intense sneezing from MRI contrast  . Hydrocodone Anaphylaxis  . Iodinated Diagnostic Agents Shortness Of Breath and Other (See Comments)    Uncontrollable sneezing and "shortness of breath after prep"- needs sedative the night before and hour and benadryl before using  . Other Other (See Comments)    Unnamed anesthesia- Patient is in the E.D. possibly due to the effects of it: Delirium, confusion, and sleep deprivation  . Statins Other (See Comments)    Muscle pain  . Hydromorphone Other (See Comments)    MUSCLE PAIN  . Lipitor [Atorvastatin Calcium] Other (See Comments)    Muscle pain and a "knot" appears on his right side, near the ribcage  . Seroquel [Quetiapine Fumerate] Swelling and Other (See Comments)    "bad trip" and tongue became swollen    Patient Measurements: Height: 6\' 1"  (185.4 cm) Weight: 88 kg (194 lb 0.1 oz) IBW/kg (Calculated) : 79.9  Vital Signs: BP: 111/81 (08/23 0125) Pulse Rate: 87 (08/23 0125)  Labs: Recent Labs    09/24/19 0700 09/25/19 0500 09/26/19 0630  LABPROT 27.1* 27.8* 24.3*  INR 2.6* 2.7* 2.3*    Estimated Creatinine Clearance: 66 mL/min (by C-G formula based on SCr of 1.16 mg/dL).   Medical History: Past Medical History:  Diagnosis Date  . Aortic aneurysm, thoracic ([place])   . Aortic valve disease   . Arthritis   . BPH (benign prostatic hyperplasia)   . COPD (chronic obstructive pulmonary disease) ([place])   . GERD (gastroesophageal reflux disease)   . HTN (hypertension)   . Hypercholesterolemia   . Stroke ([place])   . TIA (transient ischemic attack)    Assessment: 74 yom presented to the ED with AMS. He is pending bed placement. He is on chronic warfarin. INR was therapeutic on admission and provider restarted him  home regimen.   PTA warfarin regimen: 10mg  daily except 7mg  on Sat  INR therapeutic this AM at 2.3, previously backed off home regimen d/t supratherapeutic INR  Goal of Therapy:  INR 2-2.5 Monitor platelets by anticoagulation protocol: Yes   Plan:  Warfarin 7mg  PO x 1 today Daily INR, s/s bleeding  Bertis Ruddy, PharmD Clinical Pharmacist ED Pharmacist Phone # [phone] 09/26/2019 7:17 AM

## 2019-09-26 NOTE — ED Notes (Signed)
Belongings in locker 6 

## 2019-09-26 NOTE — ED Notes (Signed)
Scheduled medication Umeclidinium not available at schedule time, pharmacy notified, awaiting for pharmacy to send it to the ED.

## 2019-09-26 NOTE — ED Notes (Addendum)
Pt able to shower self. New scrubs provided. Pt brushed teeth and hair preparing for bed. Pt pleasant and cooperative with staff

## 2019-09-26 NOTE — ED Notes (Signed)
Ordered breakfast--Maudine Kluesner 

## 2019-09-26 NOTE — ED Notes (Signed)
Pt made second and final phone call for the night

## 2019-09-26 NOTE — ED Provider Notes (Signed)
Evaluated patient at bedside.  He is currently calm but is asking when he will be evaluated by psychiatry as he wants to leave.  He states that if he is not evaluated by psychiatry by 1 PM that he is going to leave the ED.  I reiterated the fact that he is under IVC at this time.  Attempted to call patient assessment and no one answered.  TTS consult was placed again.  Physical Exam Constitutional:      Appearance: He is not ill-appearing.  HENT:     Head: Normocephalic and atraumatic.  Eyes:     Conjunctiva/sclera: Conjunctivae normal.  Pulmonary:     Effort: Pulmonary effort is normal.  Abdominal:     General: Abdomen is flat.  Musculoskeletal:        General: Normal range of motion.  Neurological:     Mental Status: He is alert.    Psych currently seeking inpt placement pending obtaining collateral.    Rodney Booze, PA-C 09/26/19 1142    Sherwood Gambler, MD 09/26/19 1521

## 2019-09-26 NOTE — Telephone Encounter (Signed)
Pt's wife left a VM asking for a call back. [phone].

## 2019-09-26 NOTE — Progress Notes (Signed)
Pt re-faxed to the following hospitals for review:  [facility]  [facility]  [facility]  [facility]  [facility]      Disposition will continue to assist with inpatient placement needs.   Audree Camel, LCSW, [place] Disposition [facility]/TTS [phone]

## 2019-09-26 NOTE — ED Notes (Signed)
tts in process  

## 2019-09-26 NOTE — ED Notes (Signed)
Patient eating dinner tray. 

## 2019-09-26 NOTE — ED Notes (Signed)
Pt wandering hall. States that he was awaken by noises outside his room and has been unable to fall back to sleep. Informed to stay in room and ensure he is wearing masking. Pt cooperative with RN directions.

## 2019-09-26 NOTE — ED Notes (Signed)
Wife at bedside.

## 2019-09-27 DIAGNOSIS — R4182 Altered mental status, unspecified: Secondary | ICD-10-CM | POA: Diagnosis not present

## 2019-09-27 DIAGNOSIS — F29 Unspecified psychosis not due to a substance or known physiological condition: Secondary | ICD-10-CM | POA: Diagnosis not present

## 2019-09-27 LAB — PROTIME-INR
INR: 2.1 — ABNORMAL HIGH (ref 0.8–1.2)
Prothrombin Time: 22.6 seconds — ABNORMAL HIGH (ref 11.4–15.2)

## 2019-09-27 MED ORDER — HALOPERIDOL 5 MG PO TABS: 5.0000 mg | ORAL_TABLET | Freq: Every day | ORAL | 0 refills | Status: DC

## 2019-09-27 MED ORDER — WARFARIN SODIUM 10 MG PO TABS
10.0000 mg | ORAL_TABLET | Freq: Once | ORAL | Status: DC
  Filled 2019-09-27: qty 1

## 2019-09-27 MED ORDER — BENZTROPINE MESYLATE 1 MG PO TABS
1.0000 mg | ORAL_TABLET | Freq: Every day | ORAL | 0 refills | Status: DC | PRN
Start: 2019-09-27 — End: 2019-12-02

## 2019-09-27 MED ORDER — MELATONIN 5 MG PO TABS: ORAL_TABLET | ORAL | 0 refills | Status: DC

## 2019-09-27 NOTE — ED Provider Notes (Signed)
Emergency Medicine Observation Re-evaluation Note  Alexander Barton is a 69 y.o. male, seen on rounds today.  Pt initially presented to the ED for complaints of Altered Mental Status rPesented to the ED on 09/22/19 for altered mental status, insomnia, pressured speech, hyperreligiosity. Patient underwent anesthesia for a procedure on 09/02/19 and reportedly had similar reactions to anesthesia in the past.  Currently, the patient is resting comfortably, he tells me the story of his prior surgical interventions and feeling foggy after anesthesia. He is calm and cooperative.   Physical Exam  BP (!) 124/58 (BP Location: Right Arm)   Pulse 75   Temp 98.3 F (36.8 C) (Oral)   Resp 16   Ht 6\' 1"  (1.854 m)   Wt 88 kg   SpO2 97%   BMI 25.60 kg/m  Physical Exam General: Alert, resting comfortably. Cardiac: Regular rate & rhythm.  Lungs: Lungs CTA, no respiratory distress.  Psych: Calm cooperative.   ED Course / MDM  EKG:EKG Interpretation  Date/Time:  Monday September 26 2019 12:58:14 EDT Ventricular Rate:  83 PR Interval:  158 QRS Duration: 88 QT Interval:  384 QTC Calculation: 451 R Axis:   57 Text Interpretation: Sinus rhythm with Premature atrial complexes Otherwise normal ECG When compared with ECG of 09/22/2019, Premature atrial complexes are now present Confirmed by Delora Fuel ([id number]) on 09/26/2019 9:54:21 PM    I have reviewed the labs performed to date as well as medications administered while in observation.  Recent changes in the last 24 hours include:  Patient re-evaluated by [facility] yesterday AM- plan continues to be for transfer and admit to a geripsych facillity. [facility] provider started scheduled haldol & melatonin @ bedtime as well as PRN haldol for agitation, EKG to monitor QTc--> 451 as documented above.   Plan  Current plan is for inpatient treatment. Patient is under full IVC at this time.   Leafy Kindle 09/27/19 1235    Carmin Muskrat, MD 09/27/19  [phone]

## 2019-09-27 NOTE — Progress Notes (Signed)
ANTICOAGULATION CONSULT NOTE - Follow-Up Consult  Pharmacy Consult for warfarin Indication: aortic valve disease  Patient Measurements: Height: 6\' 1"  (185.4 cm) Weight: 88 kg (194 lb 0.1 oz) IBW/kg (Calculated) : 79.9  Vital Signs: Temp: 98.3 F (36.8 C) (08/24 0708) Temp Source: Oral (08/24 0708) BP: 124/58 (08/24 0708) Pulse Rate: 75 (08/24 0708)  Labs: Recent Labs    09/25/19 0500 09/26/19 0630 09/27/19 0818  LABPROT 27.8* 24.3* 22.6*  INR 2.7* 2.3* 2.1*    Estimated Creatinine Clearance: 66 mL/min (by C-G formula based on SCr of 1.16 mg/dL).  Assessment: 18 yom presented to the ED with AMS. He is pending bed placement. He is on chronic warfarin. INR was therapeutic on admission and provider restarted him home regimen.   PTA warfarin regimen: 10mg  daily except 7mg  on Sat  INR today remains therapeutic (INR 2.1 << 2.3, goal of 2-2.5). No CBC since 8/19 - was stable at that time.  Goal of Therapy:  INR 2-2.5 Monitor platelets by anticoagulation protocol: Yes   Plan:  - Warfarin 10 mg x 1 dose at 1600 today - Daily PT/INR, CBC q72h - Will continue to monitor for any signs/symptoms of bleeding and will follow up with PT/INR in the a.m.    Thank you for allowing pharmacy to be a part of this patients care.  Alycia Rossetti, PharmD, BCPS Clinical Pharmacist Clinical phone for 09/27/2019: [phone] 09/27/2019 1:57 PM   **Pharmacist phone directory can now be found on [email or web address] ([email or web address]).  Listed under [place].

## 2019-09-27 NOTE — Discharge Instructions (Addendum)
You have been cleared by our behavior health team, they have sent in multiple prescriptions for you, please take these as prescribed.   Discuss the medications prescribed today with your pharmacist as they can have adverse effects and interactions with your other medicines including over the counter and prescribed medications. Seek medical evaluation if you start to experience new or abnormal symptoms after taking one of these medicines, seek care immediately if you start to experience difficulty breathing, feeling of your throat closing, facial swelling, or rash as these could be indications of a more serious allergic reaction  Please follow up with your primary care provider and behavioral health within 3 days. Return to the ER for new or worsening symptoms including but not limited to thoughts of hurting yourself or others, hallucinations, confusion, or any other concerns.

## 2019-09-27 NOTE — ED Provider Notes (Signed)
Contacted by NP Harriett Sine with [facility]--> patient has been psychiatrically cleared for discharge at this time, they have sent in prescriptions for Haldol, PRN Cogentin, and melatonin to his outpatient pharmacy. Patient calm & cooperative on my initial assessment earlier today as well as on re-assessment this afternoon. IVC rescinded. Will discharge with [facility] resources for follow up/PCP follow up and strict return precautions.    Leafy Kindle 09/27/19 1654    Lucrezia Starch, MD 09/29/19 Benancio Deeds

## 2019-09-27 NOTE — ED Notes (Signed)
Personal belongings returned to patient.  Discharge instructions given to wife.  Wife verbalized understanding.

## 2019-09-27 NOTE — ED Notes (Signed)
IVC gray bracelet provided for pt

## 2019-09-27 NOTE — Progress Notes (Signed)
Reassessment: Patient seen via telepsych. Chart reviewed. He presented to the ED on 09/22/19 for altered mental status, insomnia, pressured speech, hyperreligiosity. Patient underwent anesthesia for a procedure on 09/02/19 and reportedly had similar reactions to anesthesia in the past.   On assessment today, patient is oriented x 3. He took scheduled Haldol and melatonin last night. He is calmer and more able to stay on topic on assessment today. He presents with more stable affect and reports his mood feels more stable. Denies irritability. He strongly denies any SI/HI/AVH. He slept well through the night. He reports some difficulty focusing at times but denies confusion episodes today. He remains religiously focused and has been reading his Bible today, but as wife confirms, he is a retired [employer] manager and not expressing any delusional thought content today. Nursing confirms he has been calm, cooperative, and appropriate.  With patient's expressed consent, collateral information was obtained from his wife Alexander Barton [phone]. Ms. Popper visited earlier today and confirms patient is improved today after sleeping overnight. She denies safety concerns for discharge. Patient advised that prescriptions for Haldol, PRN Cogentin, and melatonin will be sent to outpatient pharmacy. They are encouraged to follow up with outpatient referral to evaluate if there is a need for ongoing treatment, as patient's behavioral symptoms from anesthesia have resolved with past episodes.  Disposition: Patient shows no evidence of acute risk of harm to self or others and is psych cleared for discharge. ED RN and EDP updated. CSW notified of request for outpatient referrals for follow-up.

## 2019-09-29 NOTE — Telephone Encounter (Signed)
Wife called today advising that the patient had a 3rd assessment tues and she stated they were unable to find a bed in the state of [place] to send the patient. She stated that after the assessment the MD felt that from her standpoint she felt It was safe as long as the wife felt safe to take him home. Patient was dc home with wife to follow up with his PCP and find a behavioral health psychiatrist in the [place] area. I have provided the wife with [facility], Dr Casimiro Needle, [facility] in [place].  She will discuss these with PCP to get referral.

## 2019-10-03 ENCOUNTER — Encounter: Payer: Self-pay | Admitting: Family Medicine

## 2019-10-03 ENCOUNTER — Ambulatory Visit (INDEPENDENT_AMBULATORY_CARE_PROVIDER_SITE_OTHER): Payer: Medicare PPO | Admitting: Cardiology

## 2019-10-03 ENCOUNTER — Ambulatory Visit: Payer: Medicare PPO | Admitting: Family Medicine

## 2019-10-03 ENCOUNTER — Other Ambulatory Visit: Payer: Self-pay

## 2019-10-03 DIAGNOSIS — Z7901 Long term (current) use of anticoagulants: Secondary | ICD-10-CM | POA: Diagnosis not present

## 2019-10-03 DIAGNOSIS — R4182 Altered mental status, unspecified: Secondary | ICD-10-CM

## 2019-10-03 DIAGNOSIS — I359 Nonrheumatic aortic valve disorder, unspecified: Secondary | ICD-10-CM

## 2019-10-03 LAB — POCT INR: INR: 1.6 — AB (ref 2.0–3.0)

## 2019-10-03 NOTE — Patient Instructions (Signed)
Nice to see you. Please call the number that was provided to you to schedule an appointment.  Please continue the Haldol. If you develop thoughts of harming your self, harming others, or you start to see or hear things that are not there or if your symptoms worsen again please seek medical attention in the emergency department.

## 2019-10-03 NOTE — Progress Notes (Signed)
[name], MD Phone: [phone]  Alexander Barton is a 69 y.o. male who presents today for f/u.  Altered mental status: Patient was evaluated in our office by Dawson Bills for this.  Underwent MRI and lab work which was unremarkable for specific cause.  He subsequently saw neurology who felt as though he needed emergent psychiatric evaluation.  He was transported to the emergency department at [facility] at that time.  He was evaluated by psychiatry there and they tried to get him set up with a geriatric psych inpatient bed that they could not find anywhere for him to go.  He was started on Haldol and Cogentin and progressively improved from the initial symptoms he was having.  They felt he was safe for discharge home for follow-up with me and outpatient psychiatry.  He has done relatively well since being home.  His wife notes he is much improved from when they went to the hospital though is not quite back to his baseline.  He is very talkative.  They note no depression, SI, HI, or auditory or visual hallucinations.  They note he missed his dose of Haldol on Friday and it made a huge difference on Saturday when he was very talkative and wanted to talk to everybody when they went to the grocery store and to a shopping store.  They have not missed a dose since then and he has been doing better on the medication.  They have not been taking Cogentin due to tremors.  They tried to get him set up at [facility] for psychiatric evaluation though his wife notes they would not accept him given that he does not have Medicaid.  His current issues may have been related to him having had anesthesia recently for several procedures.   Social History   Tobacco Use  Smoking Status Never Smoker  Smokeless Tobacco Never Used     ROS see history of present illness  Objective  Physical Exam Vitals:   10/03/19 0938  BP: 120/70  Pulse: 86  Temp: 98.2 F (36.8 C)  SpO2: 98%    BP Readings from Last  3 Encounters:  10/03/19 120/70  09/27/19 120/66  09/22/19 (!) 154/78   Wt Readings from Last 3 Encounters:  10/03/19 195 lb 3.2 oz (88.5 kg)  09/23/19 194 lb 0.1 oz (88 kg)  09/22/19 195 lb (88.5 kg)    Physical Exam Constitutional:      General: He is not in acute distress.    Appearance: He is not diaphoretic.  Cardiovascular:     Rate and Rhythm: Normal rate and regular rhythm.     Heart sounds: Normal heart sounds.  Pulmonary:     Effort: Pulmonary effort is normal.     Breath sounds: Normal breath sounds.  Skin:    General: Skin is warm and dry.  Neurological:     Mental Status: He is alert.  Psychiatric:     Comments: Mood is normal, patient is somewhat hyperactive in the room on occasion getting up and moving around, he is very talkative, he does not appear to be responding to any internal stimuli      Assessment/Plan: Please see individual problem list.  AMS (altered mental status) Patient appears to be doing better than when he went into the hospital.  He and his wife both endorse this.  He does have excessive talkativeness though otherwise appears to be doing well.  He needs to see psychiatry and they were provided with the  phone number for [facility] to call and schedule an appointment.  I discussed with the patient and his wife that I did not feel he needed to be seen today by them though within the next week would be optimal.  They will continue his Haldol for now until he sees psychiatry.  Advised to seek medical attention if he his symptoms worsen again or if he develops thoughts of harming himself or others.   Orders Placed This Encounter  Procedures  . Ambulatory referral to Psychiatry    Referral Priority:   Routine    Referral Type:   Psychiatric    Referral Reason:   Specialty Services Required    Requested Specialty:   Psychiatry    Number of Visits Requested:   1    No orders of the defined types were placed in this  encounter.   This visit occurred during the SARS-CoV-2 public health emergency.  Safety protocols were in place, including screening questions prior to the visit, additional usage of staff PPE, and extensive cleaning of exam room while observing appropriate contact time as indicated for disinfecting solutions.   I have spent 32 minutes in the care of this patient regarding chart review, documentation, history taking, physical exam, discussion of plan to see psychiatry, working with office staff to determine how to get him into psychiatry for an appointment.   [name], MD [place]

## 2019-10-03 NOTE — Assessment & Plan Note (Addendum)
Patient appears to be doing better than when he went into the hospital.  He and his wife both endorse this.  He does have excessive talkativeness though otherwise appears to be doing well.  He needs to see psychiatry and they were provided with the phone number for [facility] to call and schedule an appointment.  I discussed with the patient and his wife that I did not feel he needed to be seen today by them though within the next week would be optimal.  They will continue his Haldol for now until he sees psychiatry.  Advised to seek medical attention if he his symptoms worsen again or if he develops thoughts of harming himself or others.

## 2019-10-04 ENCOUNTER — Telehealth: Payer: Self-pay | Admitting: Family Medicine

## 2019-10-04 NOTE — Telephone Encounter (Signed)
I placed a referral for psychiatry yesterday for this patient as an urgent referral. His wife tried to schedule an appointment for him though noted it was going to be October before they could see him. He was recently hospitalized for altered mental status and has been on haldol for this which has been beneficial though he is still not back to his baseline as he has continued excessive talkativeness, pressured speech, hyperreligiosity (though improved). It would be preferable if he could possibly be seen in the next week or so. Can you send the referral to [facility] or try Dr Nicolasa Ducking if unable to get him in at [facility]? Thanks.

## 2019-10-04 NOTE — Telephone Encounter (Signed)
[facility] is bood out until the end of October, unable to see patent until then. Try [facility], or [facility].

## 2019-10-04 NOTE — Addendum Note (Signed)
Addended by: Leone Haven on: 10/04/2019 10:58 AM   Modules accepted: Orders

## 2019-10-11 ENCOUNTER — Ambulatory Visit (INDEPENDENT_AMBULATORY_CARE_PROVIDER_SITE_OTHER): Payer: Medicare PPO | Admitting: Cardiovascular Disease

## 2019-10-11 DIAGNOSIS — I359 Nonrheumatic aortic valve disorder, unspecified: Secondary | ICD-10-CM | POA: Diagnosis not present

## 2019-10-11 DIAGNOSIS — Z7901 Long term (current) use of anticoagulants: Secondary | ICD-10-CM | POA: Diagnosis not present

## 2019-10-11 LAB — POCT INR: INR: 2.5 (ref 2.0–3.0)

## 2019-10-19 ENCOUNTER — Encounter: Payer: Self-pay | Admitting: Family Medicine

## 2019-10-20 MED ORDER — HALOPERIDOL 5 MG PO TABS
5.0000 mg | ORAL_TABLET | Freq: Every day | ORAL | 0 refills | Status: DC
Start: 2019-10-20 — End: 2019-12-03

## 2019-10-24 ENCOUNTER — Other Ambulatory Visit: Payer: Self-pay

## 2019-10-24 ENCOUNTER — Ambulatory Visit: Payer: Medicare PPO | Attending: Family Medicine

## 2019-10-24 DIAGNOSIS — M25561 Pain in right knee: Secondary | ICD-10-CM | POA: Insufficient documentation

## 2019-10-24 DIAGNOSIS — R262 Difficulty in walking, not elsewhere classified: Secondary | ICD-10-CM

## 2019-10-24 DIAGNOSIS — M6281 Muscle weakness (generalized): Secondary | ICD-10-CM | POA: Diagnosis not present

## 2019-10-24 DIAGNOSIS — M545 Low back pain, unspecified: Secondary | ICD-10-CM

## 2019-10-24 DIAGNOSIS — G8929 Other chronic pain: Secondary | ICD-10-CM | POA: Insufficient documentation

## 2019-10-24 NOTE — Therapy (Signed)
[facility] [address], [place], [address] Phone: [phone]   Fax:  [phone]  Physical Therapy Evaluation  Patient Details  Name: Alexander Barton MRN: [id number] Date of Birth: May 07, 1950 Referring Provider (PT): [name], MD    Encounter Date: 10/24/2019   PT End of Session - 10/24/19 0952    Visit Number 1    Number of Visits 13    Date for PT Re-Evaluation 12/08/19    Authorization Type 1    Authorization Time Period of 10    PT Start Time 0952    PT Stop Time 1053    PT Time Calculation (min) 61 min    Activity Tolerance Patient tolerated treatment well    Behavior During Therapy [facility] for tasks assessed/performed           Past Medical History:  Diagnosis Date  . Aortic aneurysm, thoracic ([place])   . Aortic valve disease   . Arthritis   . BPH (benign prostatic hyperplasia)   . COPD (chronic obstructive pulmonary disease) ([place])   . GERD (gastroesophageal reflux disease)   . HTN (hypertension)   . Hypercholesterolemia   . Stroke ([place])   . TIA (transient ischemic attack)     Past Surgical History:  Procedure Laterality Date  . ACHILLES TENDON REPAIR    . AORTIC VALVE REPLACEMENT     #23 On-X valve conduit  . ASCENDING AORTIC ANEURYSM REPAIR    . BACK SURGERY    . FOOT SURGERY      There were no vitals filed for this visit.    Subjective Assessment - 10/24/19 0954    Subjective LBP occasionally. LBP (central) 0/10 currently (pt sitting on chair), 7/10 at most for the past 3 months (Tylenol eases pain)/.    Pertinent History B LE weakness. Had 3 procedures at [facility] due to pancreatitis.  Legs are not as strong like he would like them to be. Has been having tremmors. Locks his knees into extension in standing when he is having tremmors. Only has LE tremmors, not UE. Walks at the mall and at his son's gym for 30 minutes to an hour 3 days a week.  No falls for the past 6 month.   Pt is not to lift over 20 lbs secondary to his TEVAR surgery.Pt also states having R knee problems. 3 weeks ago, pt stepped off a step but did not realize how low it was. Miscalculated step height and lost balance, kept stumbling forward until his feet caught up to him. Did not fall. This incident re-aggravated his R knee pain. 2/10 R knee pain currently (pt sitting on chair). 3/10 when walking from waiting room to treatment room. 7/10 at most for the pasts 2 weeks. Alleviating factors for R knee: rest. Aggravating factors R knee: twisting, turning, getting up out of chair or commode. Pt presses on R knee to stand up from commode to help him get up.    Patient Stated Goals Improve ability to work in the yard (has decreased balance and has fear of falling). Improve balance and improve B UE and LE strength.    Currently in Pain? No/denies    Pain Score 0-No pain    Pain Location Back    Pain Orientation Mid;Lower    Pain Type Chronic pain    Pain Onset More than a month ago    Pain Frequency Occasional    Aggravating Factors  Laying in bed  for a while.    Pain Relieving Factors Tylenol, sitting on his recliner.              [facility] PT Assessment - 10/24/19 1009      Assessment   Medical Diagnosis B LE weakness    Referring Provider (PT) [name], MD     Onset Date/Surgical Date 09/13/19   Date PT referral signed   Prior Therapy Prior PT here with good progress with LE strength and function.       Precautions   Precaution Comments No lifting over 20 lbs per pt secondary to TEVAR surgery      Restrictions   Other Position/Activity Restrictions no known weight bearing restrictions      Balance Screen   Has the patient fallen in the past 6 months No    Has the patient had a decrease in activity level because of a fear of falling?  No    Is the patient reluctant to leave their home because of a fear of falling?  No      Home Environment   Additional Comments Pt lives in a 1 story  home with his wife.        Prior Function   Vocation Requirements PLOF: able to ambulate independently, play with his grandchildren    Leisure golf      Observation/Other Assessments   Focus on Therapeutic Outcomes (FOTO)  LE FOTO 60      Posture/Postural Control   Posture Comments slight R trunk rotation, L scapular more forward, protracted neck, B protraced shoulders,  decreased lordosis, B genu valgus, R hip in ER.       AROM   Lumbar Flexion WFL   slight R pelvic rotation   Lumbar Extension limited with slight R lumbar rotation    Lumbar - Right Side Bend limited    Lumbar - Left Side Bend limited     Lumbar - Right Rotation WFL    Lumbar - Left Rotation WFL      PROM   Overall PROM Comments --      Strength   Right Hip Extension 4/5   seated manually resisted   Right Hip ABduction 4/5   with R greater trochanter pain.    Left Hip Extension 4+/5   seated manually resisted   Left Hip ABduction 4/5      Palpation   Palpation comment TTP R greater trochanter      Special Tests   Other special tests 6 minute walk test: 1636 ft      Ambulation/Gait   Gait Comments R pelvic drop during L LE stance phase, R genu valgus during R LE stance phase. Independent ambulation      Dynamic Gait Index   Level Surface Normal    Change in Gait Speed Mild Impairment    Gait with Horizontal Head Turns Mild Impairment    Gait with Vertical Head Turns Mild Impairment    Gait and Pivot Turn Normal    Step Over Obstacle Normal    Step Around Obstacles Normal    Steps Moderate Impairment    Total Score 19    DGI comment: Less than 19 suggests increased risk for falls.                       Objective measurements completed on examination: See above findings.   Blood pressure RIGHT ARM sitting, mechanically taken. 121/57, HR 96    No lifting  over 20 lbs per pt secondary to TEVAR surgery   Pt ambulating independently. No rollator walker.  Hx of T6/T7 surgery secondary  to bulging disc 1998. No fusion.     Pt also states having R knee problems. 3 weeks ago, pt stepped off a step but did not realize how low it was. Miscalculated step height and lost balance, kept stumbling forward until his feet caught up to him. Did not fall. This incident re-aggravated his R knee pain. 2/10 R knee pain currently (pt sitting on chair). 3/10 when walking from waiting room to treatment room. 7/10 at most for the pasts 2 weeks. Alleviating factors for R knee: rest. Aggravating factors R knee: twisting, turning, getting up out of chair or commode. Pt presses on R knee to stand up from commode to help him get up.   TTP R greater trochanter    6 minute walk test 1636 ft  R knee pain during stair negotiation. Difficulty steping up with R LE with decreased femoral control. Pt steps down with R LE. Step to pattern. Decreased R LE femoral control    Check hip IR next visit if appropriate    Patient is a 69 year old male who returns to physical therapy secondary to B LE weakness. He was previously working on improving LE strength with PT before with good progress but had to stop secondary to medical issues. Pt currently presents with altered gait pattern and posture, bilateral hip weakness, decreased femoral control R > L, low back and R knee pain, and difficulty performing tasks such as stair negotiation as well as standing up after sitting on a low surface secondary to R knee pain and LE weakness. Pt will benefit from skilled physical therapy services to address the aforementioned deficits.       PT Education - 10/24/19 1205    Education Details plan of care    Person(s) Educated Patient    Methods Explanation    Comprehension Verbalized understanding               PT Short Term Goals - 10/24/19 1211      PT SHORT TERM GOAL #1   Title Pt will be independent with his HEP to improve strength, balance, and function.    Time 3    Period Weeks    Target Date 11/17/19               PT Long Term Goals - 10/24/19 1212      PT LONG TERM GOAL #1   Title Patient will have a decrease in R knee pain to 4/10 or less at most to improve balance, decrease difficulty with stair negotiation as well as standing up from a low surface such as a commode.    Baseline 7/10 R knee pain at most for the past 2 weeks (10/24/2019)    Time 6    Period Weeks    Status New    Target Date 12/08/19      PT LONG TERM GOAL #2   Title Patient will have a decrease in low back pain to 4/10 or less at most to promote ability to perform standing tasks as well as get out of the bed with less difficulty.    Baseline 7/10 low back pain at most for the past 3 months (10/24/2019)    Time 6    Period Weeks    Status New    Target Date 12/08/19      PT  LONG TERM GOAL #3   Title Patient will improve bilateral hip abduction and extension strength by at least 1/2 MMT grade to promote ability to negotiation stairs, perform transfers with less difficulty.    Time 6    Period Weeks    Status New    Target Date 12/08/19      PT LONG TERM GOAL #4   Title Patient will improve his LE FOTO score by at least 10 points as a demonstration of improved function.    Baseline LE FOTO 60 (10/24/2019)    Time 6    Period Weeks    Status New    Target Date 12/08/19                  Plan - 10/24/19 1205    Clinical Impression Statement Patient is a 69 year old male who returns to physical therapy secondary to B LE weakness. He was previously working on improving LE strength with PT before with good progress but had to stop secondary to medical issues. Pt currently presents with altered gait pattern and posture, bilateral hip weakness, decreased femoral control R > L, low back and R knee pain, and difficulty performing tasks such as stair negotiation as well as standing up after sitting on a low surface secondary to R knee pain and LE weakness. Pt will benefit from skilled physical therapy services to  address the aforementioned deficits.    Personal Factors and Comorbidities Age;Comorbidity 3+;Past/Current Experience;Time since onset of injury/illness/exacerbation    Comorbidities Aortic thoracic aneurysm, Aortic valve disease, arthritis, BPH, COPD, hx of CVA and TIA, aortic valve replacement    Examination-Activity Limitations Bed Mobility;Stairs;Transfers;Squat    Stability/Clinical Decision Making Stable/Uncomplicated    Clinical Decision Making Low    Rehab Potential Fair    PT Frequency 2x / week    PT Duration 6 weeks    PT Treatment/Interventions Therapeutic exercise;Gait training;Stair training;Functional mobility training;Therapeutic activities;Balance training;Neuromuscular re-education;Patient/family education;Manual techniques;Dry needling;Aquatic Therapy;Electrical Stimulation;Iontophoresis 4mg /ml Dexamethasone;Traction    PT Next Visit Plan scapular, trunk, glute strengthening, thoracic extension, femoral control, balance, manual techniques, modalities PRN    Consulted and Agree with Plan of Care Patient           Patient will benefit from skilled therapeutic intervention in order to improve the following deficits and impairments:  Pain, Postural dysfunction, Improper body mechanics, Decreased strength  Visit Diagnosis: Muscle weakness (generalized) - Plan: PT plan of care cert/re-cert  Difficulty in walking, not elsewhere classified - Plan: PT plan of care cert/re-cert  Chronic bilateral low back pain, unspecified whether sciatica present - Plan: PT plan of care cert/re-cert  Right knee pain, unspecified chronicity - Plan: PT plan of care cert/re-cert     Problem List Patient Active Problem List   Diagnosis Date Noted  . AMS (altered mental status) 09/21/2019  . Gallstone pancreatitis 09/21/2019  . Sleep disturbance 09/21/2019  . Asthma-COPD overlap syndrome ([place]) 08/26/2019  . Environmental exposure 08/26/2019  . Idiopathic hypoparathyroidism ([place]) 06/29/2019   . PVC's (premature ventricular contractions) 06/09/2019  . Pancreatic pseudocyst   . Intractable abdominal pain 05/30/2019  . Nausea and vomiting 05/30/2019  . Hyperglycemia 05/30/2019  . Pancreatic cyst   . Bilateral finger numbness 01/20/2019  . Headache 01/20/2019  . Night sweats 01/20/2019  . UTI (urinary tract infection) 01/20/2019  . Swelling of male genital structure 01/20/2019  . Educated about COVID-19 virus infection 01/05/2019  . Cardiomyopathy ([place]) 01/05/2019  . COVID-19 11/10/2018  . Fall  05/07/2018  . Costochondritis 01/04/2018  . Thyroid nodule 01/04/2018  . Dissecting aneurysm of thoracic aorta, Stanford type B ([place]) 11/08/2017  . Anemia 11/08/2017  . Hyponatremia 11/08/2017  . Overweight 07/01/2017  . History of stroke   . GERD (gastroesophageal reflux disease)   . Arthritis   . Neck pain 12/01/2016  . Knee osteoarthritis 12/01/2016  . Physical deconditioning 06/24/2016  . Dyspnea on exertion 06/23/2016  . Chronic pain of right knee 12/18/2015  . Chronic obstructive pulmonary disease ([place]) 11/14/2014  . BPH (benign prostatic hyperplasia) 11/14/2014  . Preventative health care 11/14/2014  . Long term current use of anticoagulant therapy 10/29/2013  . H/O aortic valve replacement 12/26/2011  . History of aortic arch replacement 01/07/2011  . HTN (hypertension)   . Hyperlipidemia   . Type 2 diabetes mellitus ([place])   . Aortic valve disease     Joneen Boers PT, DPT   10/24/2019, 12:35 PM  [facility] [address] [place], [address] Phone: [phone]   Fax:  [phone]  Name: Alexander Barton MRN: [id number] Date of Birth: [birth year]

## 2019-10-25 ENCOUNTER — Ambulatory Visit (INDEPENDENT_AMBULATORY_CARE_PROVIDER_SITE_OTHER): Payer: Medicare PPO | Admitting: Cardiovascular Disease

## 2019-10-25 DIAGNOSIS — I359 Nonrheumatic aortic valve disorder, unspecified: Secondary | ICD-10-CM | POA: Diagnosis not present

## 2019-10-25 DIAGNOSIS — Z7901 Long term (current) use of anticoagulants: Secondary | ICD-10-CM

## 2019-10-25 LAB — POCT INR: INR: 2.1 (ref 2.0–3.0)

## 2019-10-26 ENCOUNTER — Ambulatory Visit: Payer: Medicare PPO

## 2019-10-31 ENCOUNTER — Ambulatory Visit: Payer: Medicare PPO

## 2019-10-31 ENCOUNTER — Other Ambulatory Visit: Payer: Self-pay

## 2019-10-31 ENCOUNTER — Other Ambulatory Visit: Payer: Self-pay | Admitting: Internal Medicine

## 2019-10-31 DIAGNOSIS — M6281 Muscle weakness (generalized): Secondary | ICD-10-CM

## 2019-10-31 DIAGNOSIS — J449 Chronic obstructive pulmonary disease, unspecified: Secondary | ICD-10-CM

## 2019-10-31 DIAGNOSIS — R262 Difficulty in walking, not elsewhere classified: Secondary | ICD-10-CM

## 2019-10-31 DIAGNOSIS — G8929 Other chronic pain: Secondary | ICD-10-CM | POA: Diagnosis not present

## 2019-10-31 DIAGNOSIS — M545 Low back pain, unspecified: Secondary | ICD-10-CM

## 2019-10-31 DIAGNOSIS — M25561 Pain in right knee: Secondary | ICD-10-CM | POA: Diagnosis not present

## 2019-10-31 NOTE — Patient Instructions (Addendum)
   Access Code: [email or web address] URL: [email or web address] Date: 10/31/2019 Prepared by: Joneen Boers  Exercises Supine Posterior Pelvic Tilt - 1 x daily - 7 x weekly - 3 sets - 10 reps - 5 seconds hold Seated Hip External Rotation AROM - 1 x daily - 7 x weekly - 3 sets - 10 reps Seated Hip Internal Rotation AROM - 1 x daily - 7 x weekly - 3 sets - 10 reps Sideways Walking - 3 x daily - 7 x weekly - 1 sets - 2 reps

## 2019-10-31 NOTE — Therapy (Signed)
[facility] [address] [place], [address] Phone: [phone]   Fax:  [phone]  Physical Therapy Treatment  Patient Details  Name: Alexander Barton MRN: [id number] Date of Birth: 12-Sep-1950 Referring Provider (PT): [name], MD    Encounter Date: 10/31/2019   PT End of Session - 10/31/19 1520    Visit Number 2    Number of Visits 13    Date for PT Re-Evaluation 12/08/19    Authorization Type 2    Authorization Time Period of 10    PT Start Time 1520    PT Stop Time 1600    PT Time Calculation (min) 40 min    Activity Tolerance Patient tolerated treatment well    Behavior During Therapy [facility] for tasks assessed/performed           Past Medical History:  Diagnosis Date  . Aortic aneurysm, thoracic ([place])   . Aortic valve disease   . Arthritis   . BPH (benign prostatic hyperplasia)   . COPD (chronic obstructive pulmonary disease) ([place])   . GERD (gastroesophageal reflux disease)   . HTN (hypertension)   . Hypercholesterolemia   . Stroke ([place])   . TIA (transient ischemic attack)     Past Surgical History:  Procedure Laterality Date  . ACHILLES TENDON REPAIR    . AORTIC VALVE REPLACEMENT     #23 On-X valve conduit  . ASCENDING AORTIC ANEURYSM REPAIR    . BACK SURGERY    . FOOT SURGERY      There were no vitals filed for this visit.   Subjective Assessment - 10/31/19 1522    Subjective Walking and back are pretty good.    Pertinent History B LE weakness. Had 3 procedures at [facility] due to pancreatitis.  Legs are not as strong like he would like them to be. Has been having tremmors. Locks his knees into extension in standing when he is having tremmors. Only has LE tremmors, not UE. Walks at the mall and at his son's gym for 30 minutes to an hour 3 days a week.  No falls for the past 6 month.  Pt is not to lift over 20 lbs secondary to his TEVAR surgery.Pt also states having R knee  problems. 3 weeks ago, pt stepped off a step but did not realize how low it was. Miscalculated step height and lost balance, kept stumbling forward until his feet caught up to him. Did not fall. This incident re-aggravated his R knee pain. 2/10 R knee pain currently (pt sitting on chair). 3/10 when walking from waiting room to treatment room. 7/10 at most for the pasts 2 weeks. Alleviating factors for R knee: rest. Aggravating factors R knee: twisting, turning, getting up out of chair or commode. Pt presses on R knee to stand up from commode to help him get up.    Patient Stated Goals Improve ability to work in the yard (has decreased balance and has fear of falling). Improve balance and improve B UE and LE strength.    Currently in Pain? No/denies    Pain Score 0-No pain    Pain Onset More than a month ago              [facility] PT Assessment - 10/31/19 1528      PROM   Overall PROM Comments Supine hip at 90/90 IR:  10 degrees L, 21 degrees R  PT Education - 10/31/19 1526    Education Details ther-ex    Person(s) Educated Patient    Methods Explanation;Demonstration;Tactile cues;Verbal cues    Comprehension Returned demonstration;Verbalized understanding           Objective   Medbridge Access Code [email or web address]   No lifting over 20 lbs per pt secondary to TEVAR surgery   Pt ambulating independently. No rollator walker.  Hx of T6/T7 surgery secondary to bulging disc 1998. No fusion.     Therapeutic exercise  S/L hip abduction   R 10x2. Difficult compared to L LE    L 10x2  Supine hip IR stretch   L 30 seconds x 3   R 30 seconds x 3  Supine hip at 90/90  IR    L: 10x3   R 10x3    Supine posterior pelvic tilt 10x5 seconds for 3 sets  Reviewed and given as part of his HEP. Pt demonstrated and verbalized undersatnding.   Seated hip ER   R 10x3  L 10x3  Seated hip IR  R 10x3  L 10x3   Side stepping 32 ft to  the R and 32 ft to the L for 2 sets   Improved exercise technique, movement at target joints, use of target muscles after mod verbal, visual, tactile cues.    Response to treatment Pt tolerated session well without aggravaion of symptoms.  Clinical impression Worked on glute and trunk strength to promote lumbopelvic stability and balance during standing tasks. Worked on hip IR ROM to decrease stiffness and promote better mechanics from his low back to his knees to help decrease back and knee pain with standing ativities. Pt tolerated session well without aggravation of symptoms. Pt will benefit from continued skilled physical therapy services to improve strength, balance, and function.        PT Short Term Goals - 10/24/19 1211      PT SHORT TERM GOAL #1   Title Pt will be independent with his HEP to improve strength, balance, and function.    Time 3    Period Weeks    Target Date 11/17/19             PT Long Term Goals - 10/24/19 1212      PT LONG TERM GOAL #1   Title Patient will have a decrease in R knee pain to 4/10 or less at most to improve balance, decrease difficulty with stair negotiation as well as standing up from a low surface such as a commode.    Baseline 7/10 R knee pain at most for the past 2 weeks (10/24/2019)    Time 6    Period Weeks    Status New    Target Date 12/08/19      PT LONG TERM GOAL #2   Title Patient will have a decrease in low back pain to 4/10 or less at most to promote ability to perform standing tasks as well as get out of the bed with less difficulty.    Baseline 7/10 low back pain at most for the past 3 months (10/24/2019)    Time 6    Period Weeks    Status New    Target Date 12/08/19      PT LONG TERM GOAL #3   Title Patient will improve bilateral hip abduction and extension strength by at least 1/2 MMT grade to promote ability to negotiation stairs, perform transfers with less difficulty.    Time 6  Period Weeks    Status New     Target Date 12/08/19      PT LONG TERM GOAL #4   Title Patient will improve his LE FOTO score by at least 10 points as a demonstration of improved function.    Baseline LE FOTO 60 (10/24/2019)    Time 6    Period Weeks    Status New    Target Date 12/08/19                 Plan - 10/31/19 1617    Clinical Impression Statement Worked on glute and trunk strength to promote lumbopelvic stability and balance during standing tasks. Worked on hip IR ROM to decrease stiffness and promote better mechanics from his low back to his knees to help decrease back and knee pain with standing ativities. Pt tolerated session well without aggravation of symptoms. Pt will benefit from continued skilled physical therapy services to improve strength, balance, and function.    Personal Factors and Comorbidities Age;Comorbidity 3+;Past/Current Experience;Time since onset of injury/illness/exacerbation    Comorbidities Aortic thoracic aneurysm, Aortic valve disease, arthritis, BPH, COPD, hx of CVA and TIA, aortic valve replacement    Examination-Activity Limitations Bed Mobility;Stairs;Transfers;Squat    Stability/Clinical Decision Making Stable/Uncomplicated    Rehab Potential Fair    PT Frequency 2x / week    PT Duration 6 weeks    PT Treatment/Interventions Therapeutic exercise;Gait training;Stair training;Functional mobility training;Therapeutic activities;Balance training;Neuromuscular re-education;Patient/family education;Manual techniques;Dry needling;Aquatic Therapy;Electrical Stimulation;Iontophoresis 4mg /ml Dexamethasone;Traction    PT Next Visit Plan scapular, trunk, glute strengthening, thoracic extension, femoral control, balance, manual techniques, modalities PRN    Consulted and Agree with Plan of Care Patient           Patient will benefit from skilled therapeutic intervention in order to improve the following deficits and impairments:  Pain, Postural dysfunction, Improper body  mechanics, Decreased strength  Visit Diagnosis: Muscle weakness (generalized)  Difficulty in walking, not elsewhere classified  Chronic bilateral low back pain, unspecified whether sciatica present  Right knee pain, unspecified chronicity     Problem List Patient Active Problem List   Diagnosis Date Noted  . AMS (altered mental status) 09/21/2019  . Gallstone pancreatitis 09/21/2019  . Sleep disturbance 09/21/2019  . Asthma-COPD overlap syndrome ([place]) 08/26/2019  . Environmental exposure 08/26/2019  . Idiopathic hypoparathyroidism ([place]) 06/29/2019  . PVC's (premature ventricular contractions) 06/09/2019  . Pancreatic pseudocyst   . Intractable abdominal pain 05/30/2019  . Nausea and vomiting 05/30/2019  . Hyperglycemia 05/30/2019  . Pancreatic cyst   . Bilateral finger numbness 01/20/2019  . Headache 01/20/2019  . Night sweats 01/20/2019  . UTI (urinary tract infection) 01/20/2019  . Swelling of male genital structure 01/20/2019  . Educated about COVID-19 virus infection 01/05/2019  . Cardiomyopathy ([place]) 01/05/2019  . COVID-19 11/10/2018  . Fall 05/07/2018  . Costochondritis 01/04/2018  . Thyroid nodule 01/04/2018  . Dissecting aneurysm of thoracic aorta, Stanford type B ([place]) 11/08/2017  . Anemia 11/08/2017  . Hyponatremia 11/08/2017  . Overweight 07/01/2017  . History of stroke   . GERD (gastroesophageal reflux disease)   . Arthritis   . Neck pain 12/01/2016  . Knee osteoarthritis 12/01/2016  . Physical deconditioning 06/24/2016  . Dyspnea on exertion 06/23/2016  . Chronic pain of right knee 12/18/2015  . Chronic obstructive pulmonary disease ([place]) 11/14/2014  . BPH (benign prostatic hyperplasia) 11/14/2014  . Preventative health care 11/14/2014  . Long term current use of anticoagulant therapy 10/29/2013  .  H/O aortic valve replacement 12/26/2011  . History of aortic arch replacement 01/07/2011  . HTN (hypertension)   . Hyperlipidemia   . Type 2  diabetes mellitus ([place])   . Aortic valve disease     Joneen Boers PT, DPT   10/31/2019, 4:21 PM  [facility] [address] [place], [address] Phone: [phone]   Fax:  [phone]  Name: Alexander Barton MRN: [id number] Date of Birth: [birth year]

## 2019-11-02 ENCOUNTER — Other Ambulatory Visit: Payer: Self-pay

## 2019-11-02 ENCOUNTER — Ambulatory Visit: Payer: Medicare PPO

## 2019-11-02 DIAGNOSIS — M25561 Pain in right knee: Secondary | ICD-10-CM | POA: Diagnosis not present

## 2019-11-02 DIAGNOSIS — M545 Low back pain, unspecified: Secondary | ICD-10-CM

## 2019-11-02 DIAGNOSIS — R262 Difficulty in walking, not elsewhere classified: Secondary | ICD-10-CM

## 2019-11-02 DIAGNOSIS — G8929 Other chronic pain: Secondary | ICD-10-CM

## 2019-11-02 DIAGNOSIS — M6281 Muscle weakness (generalized): Secondary | ICD-10-CM | POA: Diagnosis not present

## 2019-11-02 NOTE — Patient Instructions (Signed)
Access Code: [email or web address] URL: [email or web address] Date: 11/02/2019 Prepared by: Joneen Boers  Exercises Supine Posterior Pelvic Tilt - 1 x daily - 7 x weekly - 3 sets - 10 reps - 5 seconds hold Seated Hip External Rotation AROM - 1 x daily - 7 x weekly - 3 sets - 10 reps Seated Hip Internal Rotation AROM - 1 x daily - 7 x weekly - 3 sets - 10 reps Sideways Walking - 3 x daily - 7 x weekly - 1 sets - 2 reps Seated Piriformis Stretch - 3 x daily - 7 x weekly - 1 sets - 3 reps - 30 seconds hold

## 2019-11-02 NOTE — Therapy (Signed)
[facility] [address] [place], [address] Phone: [phone]   Fax:  [phone]  Physical Therapy Treatment  Patient Details  Name: Alexander Barton MRN: [id number] Date of Birth: 1951/01/29 Referring Provider (PT): [name], MD    Encounter Date: 11/02/2019   PT End of Session - 11/02/19 0929    Visit Number 3    Number of Visits 13    Date for PT Re-Evaluation 12/08/19    Authorization Type 3    Authorization Time Period of 10    PT Start Time 0930    PT Stop Time 1013    PT Time Calculation (min) 43 min    Activity Tolerance Patient tolerated treatment well    Behavior During Therapy [facility] for tasks assessed/performed           Past Medical History:  Diagnosis Date  . Aortic aneurysm, thoracic ([place])   . Aortic valve disease   . Arthritis   . BPH (benign prostatic hyperplasia)   . COPD (chronic obstructive pulmonary disease) ([place])   . GERD (gastroesophageal reflux disease)   . HTN (hypertension)   . Hypercholesterolemia   . Stroke ([place])   . TIA (transient ischemic attack)     Past Surgical History:  Procedure Laterality Date  . ACHILLES TENDON REPAIR    . AORTIC VALVE REPLACEMENT     #23 On-X valve conduit  . ASCENDING AORTIC ANEURYSM REPAIR    . BACK SURGERY    . FOOT SURGERY      There were no vitals filed for this visit.   Subjective Assessment - 11/02/19 0931    Subjective R knee still feels weak.    Pertinent History B LE weakness. Had 3 procedures at [facility] due to pancreatitis.  Legs are not as strong like he would like them to be. Has been having tremmors. Locks his knees into extension in standing when he is having tremmors. Only has LE tremmors, not UE. Walks at the mall and at his son's gym for 30 minutes to an hour 3 days a week.  No falls for the past 6 month.  Pt is not to lift over 20 lbs secondary to his TEVAR surgery.Pt also states having R knee problems. 3  weeks ago, pt stepped off a step but did not realize how low it was. Miscalculated step height and lost balance, kept stumbling forward until his feet caught up to him. Did not fall. This incident re-aggravated his R knee pain. 2/10 R knee pain currently (pt sitting on chair). 3/10 when walking from waiting room to treatment room. 7/10 at most for the pasts 2 weeks. Alleviating factors for R knee: rest. Aggravating factors R knee: twisting, turning, getting up out of chair or commode. Pt presses on R knee to stand up from commode to help him get up.    Patient Stated Goals Improve ability to work in the yard (has decreased balance and has fear of falling). Improve balance and improve B UE and LE strength.    Currently in Pain? No/denies    Pain Score 0-No pain    Pain Onset More than a month ago                                     PT Education - 11/02/19 6440    Education Details ther-ex, HEP  Person(s) Educated Patient    Methods Explanation;Demonstration;Tactile cues;Verbal cues;Handout    Comprehension Returned demonstration;Verbalized understanding          Objective   Medbridge Access Code [email or web address]   No lifting over 20 lbs per pt secondary to TEVAR surgery   Pt ambulating independently. No rollator walker.  Hx of T6/T7 surgery secondary to bulging disc 1998. No fusion.    Therapeutic exercise  Seated piriformit stretch    L 30 seconds x 3              R 30 seconds x 3  Seated hip IR             R 10x3             L 10x3   Seated hip ER              R 10x3             L 10x3  S/L hip abduction              R 10x2.                  L 10x2                           Supine posterior pelvic tilt 10x5 seconds for 3 sets           .  SLS with one UE assist   R 10x5 seconds for 2 sets  L 10x5 seconds for 2 sets   Standing hip extension with B UE assist   R 10x5 seconds for 2 sets  L 10x5 seconds for 2 sets   Forward step  up onto first regular step with B UE assist   R LE 5x2  Seated R knee extension 5 lbs 10x2 to promote quad strength without R knee joint discomfort.   R distal IT band discomfort which eases with hip placed in slight abduction.    Improved exercise technique, movement at target joints, use of target muscles after mod verbal, visual, tactile cues.    Response to treatment Pt tolerated session well without aggravaion of symptoms.  Clinical impression Continued working on improving hip mobility, trunk, glute, and quad strengthening as well as femoral control to promote ability to perform standing tasks with less difficulty and less back and R knee pain. Decreased R knee discomfort with forward step ups with femoral control and more neutral tibial positioning. Pt tolerated session well without aggravation of symptoms. Pt will benefit from continued skilled physical therapy services to improve strength, and function.          PT Short Term Goals - 10/24/19 1211      PT SHORT TERM GOAL #1   Title Pt will be independent with his HEP to improve strength, balance, and function.    Time 3    Period Weeks    Target Date 11/17/19             PT Long Term Goals - 10/24/19 1212      PT LONG TERM GOAL #1   Title Patient will have a decrease in R knee pain to 4/10 or less at most to improve balance, decrease difficulty with stair negotiation as well as standing up from a low surface such as a commode.    Baseline 7/10 R knee pain at most for the past 2 weeks (10/24/2019)  Time 6    Period Weeks    Status New    Target Date 12/08/19      PT LONG TERM GOAL #2   Title Patient will have a decrease in low back pain to 4/10 or less at most to promote ability to perform standing tasks as well as get out of the bed with less difficulty.    Baseline 7/10 low back pain at most for the past 3 months (10/24/2019)    Time 6    Period Weeks    Status New    Target Date 12/08/19      PT LONG  TERM GOAL #3   Title Patient will improve bilateral hip abduction and extension strength by at least 1/2 MMT grade to promote ability to negotiation stairs, perform transfers with less difficulty.    Time 6    Period Weeks    Status New    Target Date 12/08/19      PT LONG TERM GOAL #4   Title Patient will improve his LE FOTO score by at least 10 points as a demonstration of improved function.    Baseline LE FOTO 60 (10/24/2019)    Time 6    Period Weeks    Status New    Target Date 12/08/19                 Plan - 11/02/19 0939    Clinical Impression Statement Continued working on improving hip mobility, trunk, glute, and quad strengthening as well as femoral control to promote ability to perform standing tasks with less difficulty and less back and R knee pain. Decreased R knee discomfort with forward step ups with femoral control and more neutral tibial positioning. Pt tolerated session well without aggravation of symptoms. Pt will benefit from continued skilled physical therapy services to improve strength, and function.    Personal Factors and Comorbidities Age;Comorbidity 3+;Past/Current Experience;Time since onset of injury/illness/exacerbation    Comorbidities Aortic thoracic aneurysm, Aortic valve disease, arthritis, BPH, COPD, hx of CVA and TIA, aortic valve replacement    Examination-Activity Limitations Bed Mobility;Stairs;Transfers;Squat    Stability/Clinical Decision Making Stable/Uncomplicated    Rehab Potential Fair    PT Frequency 2x / week    PT Duration 6 weeks    PT Treatment/Interventions Therapeutic exercise;Gait training;Stair training;Functional mobility training;Therapeutic activities;Balance training;Neuromuscular re-education;Patient/family education;Manual techniques;Dry needling;Aquatic Therapy;Electrical Stimulation;Iontophoresis 4mg /ml Dexamethasone;Traction    PT Next Visit Plan scapular, trunk, glute strengthening, thoracic extension, femoral control,  balance, manual techniques, modalities PRN    Consulted and Agree with Plan of Care Patient           Patient will benefit from skilled therapeutic intervention in order to improve the following deficits and impairments:  Pain, Postural dysfunction, Improper body mechanics, Decreased strength  Visit Diagnosis: Muscle weakness (generalized)  Difficulty in walking, not elsewhere classified  Chronic bilateral low back pain, unspecified whether sciatica present  Right knee pain, unspecified chronicity     Problem List Patient Active Problem List   Diagnosis Date Noted  . AMS (altered mental status) 09/21/2019  . Gallstone pancreatitis 09/21/2019  . Sleep disturbance 09/21/2019  . Asthma-COPD overlap syndrome ([place]) 08/26/2019  . Environmental exposure 08/26/2019  . Idiopathic hypoparathyroidism ([place]) 06/29/2019  . PVC's (premature ventricular contractions) 06/09/2019  . Pancreatic pseudocyst   . Intractable abdominal pain 05/30/2019  . Nausea and vomiting 05/30/2019  . Hyperglycemia 05/30/2019  . Pancreatic cyst   . Bilateral finger numbness 01/20/2019  . Headache 01/20/2019  .  Night sweats 01/20/2019  . UTI (urinary tract infection) 01/20/2019  . Swelling of male genital structure 01/20/2019  . Educated about COVID-19 virus infection 01/05/2019  . Cardiomyopathy ([place]) 01/05/2019  . COVID-19 11/10/2018  . Fall 05/07/2018  . Costochondritis 01/04/2018  . Thyroid nodule 01/04/2018  . Dissecting aneurysm of thoracic aorta, Stanford type B ([place]) 11/08/2017  . Anemia 11/08/2017  . Hyponatremia 11/08/2017  . Overweight 07/01/2017  . History of stroke   . GERD (gastroesophageal reflux disease)   . Arthritis   . Neck pain 12/01/2016  . Knee osteoarthritis 12/01/2016  . Physical deconditioning 06/24/2016  . Dyspnea on exertion 06/23/2016  . Chronic pain of right knee 12/18/2015  . Chronic obstructive pulmonary disease ([place]) 11/14/2014  . BPH (benign prostatic  hyperplasia) 11/14/2014  . Preventative health care 11/14/2014  . Long term current use of anticoagulant therapy 10/29/2013  . H/O aortic valve replacement 12/26/2011  . History of aortic arch replacement 01/07/2011  . HTN (hypertension)   . Hyperlipidemia   . Type 2 diabetes mellitus ([place])   . Aortic valve disease     Joneen Boers PT, DPT   11/02/2019, 10:26 AM  [facility] [address], [place], [address] Phone: [phone]   Fax:  [phone]  Name: Alexander Barton MRN: [id number] Date of Birth: [birth year]

## 2019-11-03 ENCOUNTER — Encounter: Payer: Self-pay | Admitting: Adult Health

## 2019-11-03 ENCOUNTER — Ambulatory Visit (INDEPENDENT_AMBULATORY_CARE_PROVIDER_SITE_OTHER): Payer: Medicare PPO | Admitting: Adult Health

## 2019-11-03 ENCOUNTER — Other Ambulatory Visit: Payer: Self-pay

## 2019-11-03 VITALS — BP 115/66 | HR 98 | Ht 73.0 in | Wt 193.0 lb

## 2019-11-03 DIAGNOSIS — G4700 Insomnia, unspecified: Secondary | ICD-10-CM | POA: Diagnosis not present

## 2019-11-03 DIAGNOSIS — F331 Major depressive disorder, recurrent, moderate: Secondary | ICD-10-CM

## 2019-11-03 MED ORDER — WARFARIN SODIUM 5 MG PO TABS: ORAL_TABLET | ORAL | 1 refills | Status: DC

## 2019-11-03 MED ORDER — SERTRALINE HCL 50 MG PO TABS: 50.0000 mg | ORAL_TABLET | Freq: Every day | ORAL | 2 refills | Status: DC

## 2019-11-03 NOTE — Progress Notes (Signed)
[facility] MD/PA/NP Initial Note  11/03/2019 12:51 PM Alexander Barton  MRN:  [id number]  Chief Complaint:   HPI:   Accompanied by wife.   Describes mood today as "ok". Pleasant. Mood symptoms - reports feeling depressed. Feels anxious at times. Denies irritability. Report taking Haldol a week ago. Initially started on Haldol after presenting to the ED with altered mental status, insomnia, pressured speech, and hyper-religiosity. Patient noted to have had similar reactions previously after undergoing anesthesia. Stopped taking Haldol after a month and has been off of it for a week. He and wife both feel he is doing well. Patient noting he is struggling a little  - concerned about finances. Has always worked 2 jobs and has ben on the go. Feels frustrated that he can't do more. Increased worrying effecting his sleep. Willing to try medication to help manage mood symptoms. Stable interest and motivation. Taking medications as prescribed.  Energy levels stable. Active, exercises. Walking and doing stretching exercises. Enjoys some usual interests and activities. Married. Lives with wife of 55 years. 2 children and 3 grandsons. Spending time with family. Appetite adequate. Weight stable - 193 pounds. Sleeps well most nights. Averages 4 to 5 hours over past week - 5 to 7 hours on the Haldol. Denies daytime napping. Making him self stay awake during the day.  Focus and concentration stable. Completing tasks. Managing aspects of household. 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  Denies SI or HI. denies Denies AH or VH.  Most recently taken Haldol 5 mg at bedtime.  Previous medication trials: 2008 - Seroquel - allergy. Haldol 8m at bedtime Cogentin.   Visit Diagnosis:    ICD-10-CM   1. Major depressive disorder, recurrent episode, moderate (HCC)  F33.1 sertraline (ZOLOFT) 50 MG tablet  2. Insomnia, unspecified type  G47.00     Past [facility] History: [facility]  stayed on there psychiatric unit for 2 days and then again for 4 days after his open heart surgery.     Past Medical History:  Past Medical History:  Diagnosis Date   Aortic aneurysm, thoracic (HCC)    Aortic valve disease    Arthritis    BPH (benign prostatic hyperplasia)    COPD (chronic obstructive pulmonary disease) (HCC)    GERD (gastroesophageal reflux disease)    HTN (hypertension)    Hypercholesterolemia    [place]    TIA (transient ischemic attack)     Past Surgical History:  Procedure Laterality Date   ACHILLES TENDON REPAIR     AORTIC VALVE REPLACEMENT     #23 On-X valve conduit   ASCENDING AORTIC ANEURYSM REPAIR     BACK SURGERY     FOOT SURGERY      Family Psychiatric History: Denies any family history of mental illness.  Family History:  Family History  Problem Relation Age of Onset   Alzheimer's disease Father    Diabetes Father        age onset DM   Hypertension Sister    Hypertension Brother    Diabetes Mother    Diabetes Sister    Colon cancer Neg Hx     Social History:  Social History   Socioeconomic History   Marital status: Married    Spouse name: Not on file   Number of children: 2   Years of education: Not on file   Highest education level: Not on file  Occupational History   Occupation: SScientist, clinical (histocompatibility and immunogenetics)  [employer]  Tobacco Use   Smoking status: Never Smoker   Smokeless tobacco: Never Used  Substance and Sexual Activity   Alcohol use: No    Alcohol/week: 0.0 standard drinks   Drug use: No   Sexual activity: Not Currently  Other Topics Concern   Not on file  Social History Narrative   Married, 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   One son and one daughter   He lives in [place]   One caffeinated beverage daily   08/30/2014   Social Determinants of Health   Financial Resource Strain:    Difficulty of Paying Living Expenses: Not  on file  Food Insecurity:    Worried About Charity fundraiser in the Last Year: Not on file   [employer] of Food in the Last Year: Not on file  Transportation Needs:    Lack of Transportation (Medical): Not on file   Lack of Transportation (Non-Medical): Not on file  Physical Activity:    Days of Exercise per Week: Not on file   Minutes of Exercise per Session: Not on file  Stress:    Feeling of Stress : Not on file  Social Connections:    Frequency of Communication with Friends and Family: Not on file   Frequency of Social Gatherings with Friends and Family: Not on file   Attends Religious Services: Not on file   Active Member of Clubs or Organizations: Not on file   Attends Archivist Meetings: Not on file   Marital Status: Not on file    Allergies:  Allergies  Allergen Reactions   Codeine Anaphylaxis   Gadolinium Derivatives Other (See Comments)    Intense sneezing from MRI contrast   Hydrocodone Anaphylaxis   Iodinated Diagnostic Agents Shortness Of Breath and Other (See Comments)    Uncontrollable sneezing and "shortness of breath after prep"- needs sedative the night before and hour and benadryl before using   Other Other (See Comments)    Unnamed anesthesia- Patient is in the E.D. possibly due to the effects of it: Delirium, confusion, and sleep deprivation   Statins Other (See Comments)    Muscle pain   Hydromorphone Other (See Comments)    MUSCLE PAIN   Lipitor [Atorvastatin Calcium] Other (See Comments)    Muscle pain and a "knot" appears on his right side, near the ribcage   Seroquel [Quetiapine Fumerate] Swelling and Other (See Comments)    "bad trip" and tongue became swollen    Metabolic Disorder Labs: Lab Results  Component Value Date   HGBA1C 7.3 (H) 05/30/2019   MPG 162.81 05/30/2019   MPG 123 (H) 06/04/2012   No results found for: PROLACTIN Lab Results  Component Value Date   CHOL 253 (H) 12/10/2016   TRIG 112.0  12/10/2016   HDL 45.00 12/10/2016   CHOLHDL 6 12/10/2016   VLDL 22.4 12/10/2016   LDLCALC 185 (H) 12/10/2016   LDLCALC 87 10/03/2015   Lab Results  Component Value Date   TSH 1.536 09/21/2019   TSH 1.88 06/29/2019    Therapeutic Level Labs: No results found for: LITHIUM No results found for: VALPROATE No components found for:  CBMZ  Current Medications: Current Outpatient Medications  Medication Sig Dispense Refill   acetaminophen (TYLENOL) 500 MG tablet Take 1,000 mg by mouth every 6 (six) hours as needed for mild pain.     albuterol (PROAIR HFA) 108 (90 Base) MCG/ACT  inhaler Inhale 2 puffs into the lungs every 4 (four) hours as needed for wheezing or shortness of breath.     benztropine (COGENTIN) 1 MG tablet Take 1 tablet (1 mg total) by mouth daily as needed for tremors. 10 tablet 0   blood glucose meter kit and supplies KIT Dispense based on patient and insurance preference. Use twice daily. (FOR ICD-10 E11.9). 1 each 0   Budeson-Glycopyrrol-Formoterol (BREZTRI AEROSPHERE) 160-9-4.8 MCG/ACT AERO Inhale 2 puffs into the lungs in the morning and at bedtime. 10.7 g 3   budesonide-formoterol (SYMBICORT) 160-4.5 MCG/ACT inhaler Inhale 2 puffs into the lungs 2 (two) times daily.     clobetasol ointment (TEMOVATE) 5.46 % Apply 1 application topically daily as needed (to affected areas- for rashes).      glucose blood test strip Use twice daily E11.9 100 each 1   haloperidol (HALDOL) 5 MG tablet Take 1 tablet (5 mg total) by mouth at bedtime. 30 tablet 0   Investigational - Study Medication Take 1 tablet by mouth See admin instructions. Study name: [id number] Additional study details: cholesterol [facility] [id number]- Take 1 tablet by mouth in the morning     melatonin 5 MG TABS Take 1-2 tablets (5-10 mg) by mouth daily at bedtime. 30 tablet 0   OVER THE COUNTER MEDICATION Take 1 capsule by mouth See admin instructions. Dotera Digestive Supplement capsule- Take 1  capsule by mouth in the morning     polyethylene glycol (MIRALAX / GLYCOLAX) 17 g packet Take 17 g by mouth in the morning. MIX AND DRINK     senna (SENOKOT) 8.6 MG TABS tablet Take 2 tablets by mouth in the morning and at bedtime.     sertraline (ZOLOFT) 50 MG tablet Take 1 tablet (50 mg total) by mouth daily. 30 tablet 2   tamsulosin (FLOMAX) 0.4 MG CAPS capsule TAKE 2 CAPSULES BY MOUTH EVERY DAY 174 capsule 1   Tiotropium Bromide Monohydrate (SPIRIVA RESPIMAT) 2.5 MCG/ACT AERS Inhale 1 puff into the lungs in the morning and at bedtime.      warfarin (COUMADIN) 2 MG tablet Take 2 mg by mouth See admin instructions. Take 2 mg by mouth in the late evening on Saturday ONLY     warfarin (COUMADIN) 5 MG tablet TAKE 1 TABLET DAILY AS DIRECTED WITH 2 MG TABLET (Patient taking differently: Take 5-10 mg by mouth See admin instructions. Take 10 mg by mouth in the late evening on Sun/Mon/Tues/Wed/Thurs/Fri and 5 mg on Saturday) 90 tablet 1   No current facility-administered medications for this visit.    Medication Side Effects: none  Orders placed this visit:  No orders of the defined types were placed in this encounter.   Psychiatric Specialty Exam:  Review of Systems  Blood pressure 115/66, pulse 98, height '6\' 1"'  (1.854 m), weight 193 lb (87.5 kg).Body mass index is 25.46 kg/m.  General Appearance: Neat and Well Groomed  Eye Contact:  Good  Speech:  Clear and Coherent and Normal Rate  Volume:  Normal  Mood:  Euthymic  Affect:  Appropriate and Congruent  Thought Process:  Coherent and Descriptions of Associations: Intact  Orientation:  Full (Time, Place, and Person)  Thought Content: Logical   Suicidal Thoughts:  No  Homicidal Thoughts:  No  Memory:  WNL  Judgement:  Good  Insight:  Good  Psychomotor Activity:  Normal  Concentration:  Concentration: Good  Recall:  NA  Fund of Knowledge: Good  Language: Good  Assets:  Communication Skills  Desire for Improvement Financial  Resources/Insurance Housing Intimacy Leisure Time Physical Health Resilience Social Support Talents/Skills Transportation Vocational/Educational  ADL's:  Intact  Cognition: WNL  Prognosis:  Good   Screenings:  Mini-Mental     Clinical Support from 01/13/2018 in [facility] from 12/22/2016 in [facility] from 12/05/2015 in [facility]  Total Score (max 30 points ) '30 30 30    ' PHQ2-9     Office Visit from 10/03/2019 in [facility] Office Visit from 05/25/2019 in [facility] from 01/19/2019 in [facility] from 10/18/2018 in [facility] from 09/22/2018 in [facility]  PHQ-2 Total Score 0 0 0 0 2  PHQ-9 Total Score -- -- -- 8 7      Receiving Psychotherapy: No   Treatment Plan/Recommendations:   Plan:  PDMP reviewed  1. Add Zoloft 27m daily 2. May continue Melatonin 3. Do not restart Haldol  RTC 4 weeks  Patient advised to contact office with any questions, adverse effects, or acute worsening in signs and symptoms.       RAloha Gell NP

## 2019-11-04 ENCOUNTER — Other Ambulatory Visit: Payer: Self-pay

## 2019-11-04 ENCOUNTER — Telehealth: Payer: Self-pay

## 2019-11-04 MED ORDER — WARFARIN SODIUM 5 MG PO TABS: ORAL_TABLET | ORAL | 2 refills | Status: DC

## 2019-11-04 NOTE — Telephone Encounter (Signed)
Strips sent to pharmacy.  Vianey Caniglia,cma

## 2019-11-04 NOTE — Telephone Encounter (Signed)
Spoke to pt in regards to his Warfarin 2 mg and 5 mg tablets.  Verbalized understanding and pt's needs met.

## 2019-11-07 ENCOUNTER — Telehealth: Payer: Self-pay | Admitting: Adult Health

## 2019-11-07 ENCOUNTER — Other Ambulatory Visit: Payer: Self-pay

## 2019-11-07 ENCOUNTER — Ambulatory Visit: Payer: Medicare PPO | Attending: Family Medicine

## 2019-11-07 DIAGNOSIS — R262 Difficulty in walking, not elsewhere classified: Secondary | ICD-10-CM | POA: Insufficient documentation

## 2019-11-07 DIAGNOSIS — M545 Low back pain, unspecified: Secondary | ICD-10-CM | POA: Diagnosis not present

## 2019-11-07 DIAGNOSIS — M6281 Muscle weakness (generalized): Secondary | ICD-10-CM | POA: Diagnosis not present

## 2019-11-07 DIAGNOSIS — M25561 Pain in right knee: Secondary | ICD-10-CM | POA: Diagnosis not present

## 2019-11-07 DIAGNOSIS — G8929 Other chronic pain: Secondary | ICD-10-CM | POA: Insufficient documentation

## 2019-11-07 NOTE — Patient Instructions (Signed)
Access Code: [email or web address] URL: [email or web address] Date: 11/07/2019 Prepared by: Joneen Boers  Exercises Supine Posterior Pelvic Tilt - 1 x daily - 7 x weekly - 3 sets - 10 reps - 5 seconds hold Seated Hip External Rotation AROM - 1 x daily - 7 x weekly - 3 sets - 10 reps Seated Hip Internal Rotation AROM - 1 x daily - 7 x weekly - 3 sets - 10 reps Sideways Walking - 3 x daily - 7 x weekly - 1 sets - 2 reps Seated Piriformis Stretch - 3 x daily - 7 x weekly - 1 sets - 3 reps - 30 seconds hold Standing Single Leg Stance with Unilateral Counter Support - 1 x daily - 7 x weekly - 2-3 sets - 10 reps - 5 seconds hold Standing Hip Extension with Counter Support - 1 x daily - 7 x weekly - 2 sets - 10 reps - 5 seconds hold Shoulder Extension with Resistance - 1 x daily - 7 x weekly - 3 sets - 10 reps - 5 seconds hold

## 2019-11-07 NOTE — Telephone Encounter (Signed)
Rtc to patient, he's having no mania. He's taking 25 mg Zoloft as instructed. Advised him to add the haldol back in to help him sleep. He agreed. He asked if it was okay to continue melatonin, confirmed he could continue that as well. Instructed him to call back and let us know how he was doing.

## 2019-11-07 NOTE — Telephone Encounter (Signed)
Pt called and said that he saw Alexander Barton last Thursday and she prescribed him zoloft 50 mg. The first night he slept he great but after that night he has not slept at all. Pt would like to know what can be done? Please call him at [phone]

## 2019-11-07 NOTE — Telephone Encounter (Signed)
Please review

## 2019-11-07 NOTE — Therapy (Signed)
[facility] [address] [place], [address] Phone: [phone]   Fax:  [phone]  Physical Therapy Treatment  Patient Details  Name: Alexander Barton MRN: [id number] Date of Birth: [birth year] Referring Provider (PT): [name], MD    Encounter Date: 11/07/2019   PT End of Session - 11/07/19 0806    Visit Number 4    Number of Visits 13    Date for PT Re-Evaluation 12/08/19    Authorization Type 4    Authorization Time Period of 10    PT Start Time 0806    PT Stop Time 0847    PT Time Calculation (min) 41 min    Activity Tolerance Patient tolerated treatment well    Behavior During Therapy [facility] for tasks assessed/performed           Past Medical History:  Diagnosis Date  . Aortic aneurysm, thoracic ([facility])   . Aortic valve disease   . Arthritis   . BPH (benign prostatic hyperplasia)   . COPD (chronic obstructive pulmonary disease) ([place])   . GERD (gastroesophageal reflux disease)   . HTN (hypertension)   . Hypercholesterolemia   . Stroke ([place])   . TIA (transient ischemic attack)     Past Surgical History:  Procedure Laterality Date  . ACHILLES TENDON REPAIR    . AORTIC VALVE REPLACEMENT     #23 On-X valve conduit  . ASCENDING AORTIC ANEURYSM REPAIR    . BACK SURGERY    . FOOT SURGERY      There were no vitals filed for this visit.   Subjective Assessment - 11/07/19 0808    Subjective About the same. No pain currently. R knee maybe a little better.    Pertinent History B LE weakness. Had 3 procedures at [facility] due to pancreatitis.  Legs are not as strong like he would like them to be. Has been having tremmors. Locks his knees into extension in standing when he is having tremmors. Only has LE tremmors, not UE. Walks at the mall and at his son's gym for 30 minutes to an hour 3 days a week.  No falls for the past 6 month.  Pt is not to lift over 20 lbs secondary to his TEVAR surgery.Pt  also states having R knee problems. 3 weeks ago, pt stepped off a step but did not realize how low it was. Miscalculated step height and lost balance, kept stumbling forward until his feet caught up to him. Did not fall. This incident re-aggravated his R knee pain. 2/10 R knee pain currently (pt sitting on chair). 3/10 when walking from waiting room to treatment room. 7/10 at most for the pasts 2 weeks. Alleviating factors for R knee: rest. Aggravating factors R knee: twisting, turning, getting up out of chair or commode. Pt presses on R knee to stand up from commode to help him get up.    Patient Stated Goals Improve ability to work in the yard (has decreased balance and has fear of falling). Improve balance and improve B UE and LE strength.    Currently in Pain? No/denies    Pain Score 0-No pain    Pain Onset More than a month ago                                     PT Education - 11/07/19 9381  Education Details ther-ex, HEP    Person(s) Educated Patient    Methods Explanation;Demonstration;Tactile cues;Verbal cues;Handout    Comprehension Verbalized understanding;Returned demonstration          Objective   No latex band allergies  MedbridgeAccess Code [email or web address]  TEVAR surgery 12/2018  No lifting over 20 lbs per pt secondary to TEVAR surgery   Pt ambulating independently. No rollator walker.  Hx of T6/T7 surgery secondary to bulging disc 1998. No fusion.    Therapeutic exercise   standing B shoulder extension red band 10x3 with 5 seconds holds  Side stepping 32 ft to the R and 32 ft to the L for 2 sets  Forward wedding march 32 ft x 3  Forward step up onto first regular step with B UE assist              R LE 5x3  Standing 2 kg ball toss to trampoline firm surface, feet shoulder width apart 20x  Then feet together 20x   Standing hip extension with B UE assist              R 10x5 seconds for 2 sets             L 10x5 seconds  for 2 sets  Standing static mini squat 3x10 seconds. Difficult for R knee   Seated R knee extension 5 lbs 10x, then 2x to promote quad strength   R lateral knee discomfort  Seated R hip extension isometrics 10x5 seconds   Improved exercise technique, movement at target joints, use of target muscles after mod verbal, visual, tactile cues.   Response to treatment Pt tolerated session well without aggravaion of symptoms.  Clinical impression Continued working on improving hip and quad strength, femoral control and balance to promote ability to working in his garden with less difficulty. Pt tolerated session well without aggravation of symptoms. Decreased R lateral knee strain with forward step ups with femoral control, proper tibial positioning and activation of glute max muscles. Pt will benefit from continued skilled physical therapy services to improve strength, balance, endurance and function.       PT Short Term Goals - 10/24/19 1211      PT SHORT TERM GOAL #1   Title Pt will be independent with his HEP to improve strength, balance, and function.    Time 3    Period Weeks    Target Date 11/17/19             PT Long Term Goals - 10/24/19 1212      PT LONG TERM GOAL #1   Title Patient will have a decrease in R knee pain to 4/10 or less at most to improve balance, decrease difficulty with stair negotiation as well as standing up from a low surface such as a commode.    Baseline 7/10 R knee pain at most for the past 2 weeks (10/24/2019)    Time 6    Period Weeks    Status New    Target Date 12/08/19      PT LONG TERM GOAL #2   Title Patient will have a decrease in low back pain to 4/10 or less at most to promote ability to perform standing tasks as well as get out of the bed with less difficulty.    Baseline 7/10 low back pain at most for the past 3 months (10/24/2019)    Time 6    Period Weeks    Status New  Target Date 12/08/19      PT LONG TERM GOAL #3    Title Patient will improve bilateral hip abduction and extension strength by at least 1/2 MMT grade to promote ability to negotiation stairs, perform transfers with less difficulty.    Time 6    Period Weeks    Status New    Target Date 12/08/19      PT LONG TERM GOAL #4   Title Patient will improve his LE FOTO score by at least 10 points as a demonstration of improved function.    Baseline LE FOTO 60 (10/24/2019)    Time 6    Period Weeks    Status New    Target Date 12/08/19                 Plan - 11/07/19 0818    Clinical Impression Statement Continued working on improving hip and quad strength, femoral control and balance to promote ability to working in his garden with less difficulty. Pt tolerated session well without aggravation of symptoms. Decreased R lateral knee strain with forward step ups with femoral control, proper tibial positioning and activation of glute max muscles. Pt will benefit from continued skilled physical therapy services to improve strength, balance, endurance and function.    Personal Factors and Comorbidities Age;Comorbidity 3+;Past/Current Experience;Time since onset of injury/illness/exacerbation    Comorbidities Aortic thoracic aneurysm, Aortic valve disease, arthritis, BPH, COPD, hx of CVA and TIA, aortic valve replacement    Examination-Activity Limitations Bed Mobility;Stairs;Transfers;Squat    Stability/Clinical Decision Making Stable/Uncomplicated    Rehab Potential Fair    PT Frequency 2x / week    PT Duration 6 weeks    PT Treatment/Interventions Therapeutic exercise;Gait training;Stair training;Functional mobility training;Therapeutic activities;Balance training;Neuromuscular re-education;Patient/family education;Manual techniques;Dry needling;Aquatic Therapy;Electrical Stimulation;Iontophoresis 4mg /ml Dexamethasone;Traction    PT Next Visit Plan scapular, trunk, glute strengthening, thoracic extension, femoral control, balance, manual  techniques, modalities PRN    Consulted and Agree with Plan of Care Patient           Patient will benefit from skilled therapeutic intervention in order to improve the following deficits and impairments:  Pain, Postural dysfunction, Improper body mechanics, Decreased strength  Visit Diagnosis: Muscle weakness (generalized)  Difficulty in walking, not elsewhere classified  Chronic bilateral low back pain, unspecified whether sciatica present  Right knee pain, unspecified chronicity     Problem List Patient Active Problem List   Diagnosis Date Noted  . AMS (altered mental status) 09/21/2019  . Gallstone pancreatitis 09/21/2019  . Sleep disturbance 09/21/2019  . Asthma-COPD overlap syndrome ([place]) 08/26/2019  . Environmental exposure 08/26/2019  . Idiopathic hypoparathyroidism ([place]) 06/29/2019  . PVC's (premature ventricular contractions) 06/09/2019  . Pancreatic pseudocyst   . Intractable abdominal pain 05/30/2019  . Nausea and vomiting 05/30/2019  . Hyperglycemia 05/30/2019  . Pancreatic cyst   . Bilateral finger numbness 01/20/2019  . Headache 01/20/2019  . Night sweats 01/20/2019  . Swelling of male genital structure 01/20/2019  . Educated about COVID-19 virus infection 01/05/2019  . Cardiomyopathy ([facility]) 01/05/2019  . UTI (urinary tract infection) 12/20/2018  . COVID-19 11/10/2018  . Fall 05/07/2018  . Costochondritis 01/04/2018  . Thyroid nodule 01/04/2018  . Dissecting aneurysm of thoracic aorta, Stanford type B ([place]) 11/08/2017  . Anemia 11/08/2017  . Hyponatremia 11/08/2017  . Overweight 07/01/2017  . History of stroke   . GERD (gastroesophageal reflux disease)   . Arthritis   . Neck pain 12/01/2016  . Knee osteoarthritis 12/01/2016  .  Physical deconditioning 06/24/2016  . Dyspnea on exertion 06/23/2016  . Chronic pain of right knee 12/18/2015  . Chronic obstructive pulmonary disease ([place]) 11/14/2014  . BPH (benign prostatic hyperplasia) 11/14/2014  .  Preventative health care 11/14/2014  . Long term current use of anticoagulant therapy 10/29/2013  . Type 2 diabetes mellitus without complications ([place]) 50/53/9767  . TIA (transient ischemic attack) 06/03/2012  . H/O aortic valve replacement 12/26/2011  . History of aortic arch replacement 01/07/2011  . HTN (hypertension)   . Hyperlipidemia   . Aortic valve disease     Joneen Boers PT, DPT   11/07/2019, 9:01 AM  [facility] [address] [place], [address] Phone: [phone]   Fax:  [phone]  Name: Alexander Barton MRN: [id number] Date of Birth: [birth year]

## 2019-11-07 NOTE — Telephone Encounter (Signed)
Have him restart the Haldol for now. Any mania reported?

## 2019-11-08 ENCOUNTER — Ambulatory Visit (INDEPENDENT_AMBULATORY_CARE_PROVIDER_SITE_OTHER): Payer: Medicare PPO | Admitting: Cardiovascular Disease

## 2019-11-08 DIAGNOSIS — I359 Nonrheumatic aortic valve disorder, unspecified: Secondary | ICD-10-CM

## 2019-11-08 DIAGNOSIS — Z7901 Long term (current) use of anticoagulants: Secondary | ICD-10-CM | POA: Diagnosis not present

## 2019-11-08 LAB — POCT INR: INR: 2.3 (ref 2.0–3.0)

## 2019-11-08 NOTE — Telephone Encounter (Signed)
Noted  

## 2019-11-09 ENCOUNTER — Ambulatory Visit: Payer: Medicare PPO

## 2019-11-14 ENCOUNTER — Other Ambulatory Visit: Payer: Self-pay

## 2019-11-14 ENCOUNTER — Ambulatory Visit: Payer: Medicare PPO | Admitting: Family Medicine

## 2019-11-14 ENCOUNTER — Encounter: Payer: Self-pay | Admitting: Family Medicine

## 2019-11-14 VITALS — BP 120/70 | HR 78 | Temp 98.6°F | Ht 73.0 in | Wt 192.0 lb

## 2019-11-14 DIAGNOSIS — E119 Type 2 diabetes mellitus without complications: Secondary | ICD-10-CM

## 2019-11-14 DIAGNOSIS — F419 Anxiety disorder, unspecified: Secondary | ICD-10-CM

## 2019-11-14 DIAGNOSIS — Z23 Encounter for immunization: Secondary | ICD-10-CM

## 2019-11-14 DIAGNOSIS — F32A Depression, unspecified: Secondary | ICD-10-CM

## 2019-11-14 NOTE — Progress Notes (Signed)
[name], MD Phone: [phone]  Alexander Barton is a 69 y.o. male who presents today for follow-up.  Depression: Patient notes he still has some depression.  He has had a lot of worry about money and food though his wife reports they have no issues affording things.  Some confusion.  Some trouble sleeping.  He saw psychiatry and they placed him on Zoloft though he notes that made him significantly dizzy and made it difficult to sleep so he stopped taking it several nights ago.  Melatonin has not helped him sleep.  He initially was told to hold the Haldol by psychiatry though with the difficulty sleeping they said he could restart it.  With a combination of Zoloft, melatonin, and Haldol he notes he felt like a zombie.  Denies SI.  Notes his anxiety has improved.  Diabetes: CBGs ranging 120-170.  No polyuria or polydipsia.  He does not eat any sweets and is decrease his carbohydrate intake.  Social History   Tobacco Use  Smoking Status Never Smoker  Smokeless Tobacco Never Used     ROS see history of present illness  Objective  Physical Exam Vitals:   11/14/19 1554  BP: 120/70  Pulse: 78  Temp: 98.6 F (37 C)  SpO2: 96%    BP Readings from Last 3 Encounters:  11/14/19 120/70  10/03/19 120/70  09/27/19 120/66   Wt Readings from Last 3 Encounters:  11/14/19 192 lb (87.1 kg)  10/03/19 195 lb 3.2 oz (88.5 kg)  09/23/19 194 lb 0.1 oz (88 kg)    Physical Exam Constitutional:      General: He is not in acute distress.    Appearance: He is not diaphoretic.  Cardiovascular:     Rate and Rhythm: Normal rate and regular rhythm.     Heart sounds: Normal heart sounds.  Pulmonary:     Effort: Pulmonary effort is normal.     Breath sounds: Normal breath sounds.  Skin:    General: Skin is warm and dry.  Neurological:     Mental Status: He is alert.      Assessment/Plan: Please see individual problem list.  Problem List Items Addressed This Visit    Anxiety  and depression    Continues to have issues with depression.  Anxiety is improving.  Has not been able to tolerate Zoloft.  Discussed patient with psychiatric NP and we opted to start the patient on Remeron 15 mg at bedtime to see if that would help with his depression and his sleep.  This was relayed to the patient by the psychiatry team after the patient left our office.  He was advised the day of the visit that he could take the Haldol that night though ideally he would not be taking the Haldol moving forward given risk of side effects.      Type 2 diabetes mellitus without complications (HCC) - Primary    Check A1c.  Check BMP.  He will continue to monitor his diet.  Consider medication if A1c is elevated.      Relevant Orders   Basic Metabolic Panel (BMET) (Completed)   HgB A1c (Completed)   Urine Microalbumin w/creat. ratio (Completed)    Other Visit Diagnoses    Need for immunization against influenza       Relevant Orders   Flu Vaccine QUAD High Dose(Fluad) (Completed)      This visit occurred during the SARS-CoV-2 public health emergency.  Safety protocols were in place, including screening questions  prior to the visit, additional usage of staff PPE, and extensive cleaning of exam room while observing appropriate contact time as indicated for disinfecting solutions.    [name], MD [place]

## 2019-11-14 NOTE — Patient Instructions (Addendum)
Nice to see you. We will check with your psychiatrist regarding your medication management.  If you do not hear from [place] tomorrow please let us know. We will get lab work today and contact you with the results. It is okay to take the Haldol tonight.

## 2019-11-15 ENCOUNTER — Telehealth: Payer: Self-pay

## 2019-11-15 DIAGNOSIS — K862 Cyst of pancreas: Secondary | ICD-10-CM | POA: Diagnosis not present

## 2019-11-15 DIAGNOSIS — K8020 Calculus of gallbladder without cholecystitis without obstruction: Secondary | ICD-10-CM | POA: Diagnosis not present

## 2019-11-15 DIAGNOSIS — Z9889 Other specified postprocedural states: Secondary | ICD-10-CM | POA: Diagnosis not present

## 2019-11-15 DIAGNOSIS — I7103 Dissection of thoracoabdominal aorta: Secondary | ICD-10-CM | POA: Diagnosis not present

## 2019-11-15 DIAGNOSIS — K8511 Biliary acute pancreatitis with uninfected necrosis: Secondary | ICD-10-CM | POA: Diagnosis not present

## 2019-11-15 DIAGNOSIS — K863 Pseudocyst of pancreas: Secondary | ICD-10-CM | POA: Diagnosis not present

## 2019-11-15 DIAGNOSIS — Z87891 Personal history of nicotine dependence: Secondary | ICD-10-CM | POA: Diagnosis not present

## 2019-11-15 DIAGNOSIS — I7100 Dissection of unspecified site of aorta: Secondary | ICD-10-CM | POA: Diagnosis not present

## 2019-11-15 DIAGNOSIS — K7689 Other specified diseases of liver: Secondary | ICD-10-CM | POA: Diagnosis not present

## 2019-11-15 LAB — BASIC METABOLIC PANEL
BUN: 19 mg/dL (ref 6–23)
CO2: 27 mEq/L (ref 19–32)
Calcium: 9.4 mg/dL (ref 8.4–10.5)
Chloride: 98 mEq/L (ref 96–112)
Creatinine, Ser: 1.07 mg/dL (ref 0.40–1.50)
GFR: 69.4 mL/min (ref >60.00)
Glucose, Bld: 161 mg/dL — ABNORMAL HIGH (ref 70–99)
Potassium: 4.2 mEq/L (ref 3.5–5.1)
Sodium: 133 mEq/L — ABNORMAL LOW (ref 135–145)

## 2019-11-15 LAB — MICROALBUMIN / CREATININE URINE RATIO
Creatinine,U: 86.4 mg/dL
Microalb Creat Ratio: 0.8 mg/g (ref 0.0–30.0)
Microalb, Ur: 0.7 mg/dL (ref 0.0–1.9)

## 2019-11-15 LAB — HEMOGLOBIN A1C: Hgb A1c MFr Bld: 6.8 % — ABNORMAL HIGH (ref 4.6–6.5)

## 2019-11-15 MED ORDER — MIRTAZAPINE 15 MG PO TABS: 15.0000 mg | ORAL_TABLET | Freq: Every day | ORAL | 0 refills | Status: DC

## 2019-11-15 NOTE — Telephone Encounter (Signed)
Contacted patient's wife, [name] this morning, they were actually at [facility] waiting for him to get into his apt there. Confirmed patient had discontinued zoloft, which he has. Informed her his PCP discussed medication management with [name] and they recommend he try Remeron 15 mg at hs so he can discontinue the haldol. Will send in Rx to [facility] her to have him hold his haldol and try Remeron and to call back if not effective.

## 2019-11-16 ENCOUNTER — Telehealth: Payer: Self-pay | Admitting: Adult Health

## 2019-11-16 NOTE — Telephone Encounter (Signed)
Pt's wife Hamilton Capri on VM stating new med has antihistamine in it and Pt can't take due to unable to urinate while taking. After waking today he was not in reality. Contact [name] asap @ [phone] or [phone]. Apt 10/28

## 2019-11-17 DIAGNOSIS — F419 Anxiety disorder, unspecified: Secondary | ICD-10-CM | POA: Insufficient documentation

## 2019-11-17 DIAGNOSIS — F32A Depression, unspecified: Secondary | ICD-10-CM | POA: Insufficient documentation

## 2019-11-17 NOTE — Telephone Encounter (Signed)
Discussed.

## 2019-11-17 NOTE — Assessment & Plan Note (Signed)
Check A1c.  Check BMP.  He will continue to monitor his diet.  Consider medication if A1c is elevated.

## 2019-11-17 NOTE — Telephone Encounter (Signed)
Noted  

## 2019-11-17 NOTE — Telephone Encounter (Signed)
Rtc to wife and advised her to restart his haldol at hs as previously instructed. She said his thoughts have gotten worse since he tried the Zoloft briefly and he's not been the same since. Informed her the Zoloft would not still be having lingering effects but I would see if [name] advised anything else to help with his thoughts. She reports the haldol helps some but not too significant.

## 2019-11-17 NOTE — Telephone Encounter (Signed)
[name], I will contact them this morning for an update.

## 2019-11-17 NOTE — Telephone Encounter (Signed)
He's only had one dose though correct?

## 2019-11-17 NOTE — Telephone Encounter (Signed)
Rtc to wife Alexander Barton, she reports his symptoms worsening, he refused to eat yesterday but later in the day her daughter came and he did eat for her. He's still not in reality, he's lethargic, she didn't give him any meds last night and he slept 10 pm-5 am, he woke up because he had urinated on himself. She had trouble getting him up but he finally did get up so she could clean the bed.  She gave him haldol Tuesday night and he slept 12 hours.    Secondly she says she has not picked up the Remeron, she read about it and it has antihistamines in it and that effects him urinating.   Informed her I would update Barnett Applebaum with this information.

## 2019-11-17 NOTE — Assessment & Plan Note (Signed)
Continues to have issues with depression.  Anxiety is improving.  Has not been able to tolerate Zoloft.  Discussed patient with psychiatric NP and we opted to start the patient on Remeron 15 mg at bedtime to see if that would help with his depression and his sleep.  This was relayed to the patient by the psychiatry team after the patient left our office.  He was advised the day of the visit that he could take the Haldol that night though ideally he would not be taking the Haldol moving forward given risk of side effects.

## 2019-11-18 NOTE — Telephone Encounter (Signed)
Discussed and [name] recommends he continue the haldol until office visit

## 2019-11-18 NOTE — Telephone Encounter (Signed)
Patient's wife aware and stated that he had a great night sleep.

## 2019-11-18 NOTE — Telephone Encounter (Signed)
Reviewed thank you 

## 2019-11-18 NOTE — Telephone Encounter (Signed)
Alexander Barton, will you let his sweet wife know that [name] just wants him to continue to take the Haldol for right now until they figure out something different.

## 2019-11-21 NOTE — Telephone Encounter (Signed)
Noted  

## 2019-11-21 NOTE — Telephone Encounter (Signed)
Called for update, [name] said he is sleeping a lot and pretty lethargic. Can she cut the Haldol 5 mg in 1/2 to see if that's better?

## 2019-11-21 NOTE — Telephone Encounter (Signed)
Can we give them a call this morning and check on how he is doing.

## 2019-11-21 NOTE — Telephone Encounter (Signed)
Alexander Barton aware to try and reduce the Haldol 5 mg take 1/2 tablet at hs, will call back if there are further issues or concerns.

## 2019-11-22 NOTE — Progress Notes (Signed)
VM left letting patient know that his Alpha 1 result was normal.  Advised to call with any questions.

## 2019-11-23 ENCOUNTER — Ambulatory Visit (INDEPENDENT_AMBULATORY_CARE_PROVIDER_SITE_OTHER): Payer: Medicare PPO | Admitting: Cardiovascular Disease

## 2019-11-23 DIAGNOSIS — Z7901 Long term (current) use of anticoagulants: Secondary | ICD-10-CM | POA: Diagnosis not present

## 2019-11-23 DIAGNOSIS — I359 Nonrheumatic aortic valve disorder, unspecified: Secondary | ICD-10-CM | POA: Diagnosis not present

## 2019-11-23 LAB — POCT INR: INR: 2.4 (ref 2.0–3.0)

## 2019-11-24 NOTE — Progress Notes (Signed)
Called and spoke with patient and his wife.  Advised of results per Wyn Quaker NP.  He verbalized understanding.  Nothing further needed.

## 2019-11-30 ENCOUNTER — Telehealth: Payer: Self-pay | Admitting: Adult Health

## 2019-11-30 NOTE — Telephone Encounter (Signed)
Rtc to wife, she doesn't feel it's emergent for him to go to hospital today.  They are seeing this decline and are concerned about where it's headed. Patient just doesn't know what to do unless he's directed to do so. She just wanted you to be aware of all this prior to his apt tomorrow,

## 2019-11-30 NOTE — Telephone Encounter (Signed)
Let's call and get more info. He may need to go on to the hospital with his decline.

## 2019-11-30 NOTE — Telephone Encounter (Signed)
Alexander Barton will be seeing you tomorrow at 2 pm. His wife Alexander Barton called and wanted to inform you of a few things. The family thinks he needs inpatient care. Per his wife's, he has refused to eat, take meds, drink water. He has to be reminded to do these things. The family thinks his depression is getting worse and they are concerned about him and his safety. Alexander Barton wanted to make you aware before you see him tomorrow.

## 2019-11-30 NOTE — Telephone Encounter (Signed)
Noted  

## 2019-12-01 ENCOUNTER — Telehealth: Payer: Self-pay | Admitting: Adult Health

## 2019-12-01 ENCOUNTER — Encounter: Payer: Self-pay | Admitting: Emergency Medicine

## 2019-12-01 ENCOUNTER — Ambulatory Visit (INDEPENDENT_AMBULATORY_CARE_PROVIDER_SITE_OTHER): Payer: Medicare PPO | Admitting: Adult Health

## 2019-12-01 ENCOUNTER — Other Ambulatory Visit: Payer: Self-pay

## 2019-12-01 ENCOUNTER — Ambulatory Visit
Admission: EM | Admit: 2019-12-01 | Discharge: 2019-12-01 | Disposition: A | Discharge status: Home / Self Care | Payer: Medicare PPO | Attending: Emergency Medicine | Admitting: Emergency Medicine

## 2019-12-01 ENCOUNTER — Telehealth: Payer: Self-pay

## 2019-12-01 ENCOUNTER — Encounter: Payer: Self-pay | Admitting: Adult Health

## 2019-12-01 ENCOUNTER — Observation Stay
Admission: EM | Admit: 2019-12-01 | Discharge: 2019-12-03 | Disposition: A | Discharge status: Home / Self Care | Payer: Medicare PPO | Attending: Hospitalist | Admitting: Hospitalist

## 2019-12-01 DIAGNOSIS — F331 Major depressive disorder, recurrent, moderate: Secondary | ICD-10-CM | POA: Diagnosis present

## 2019-12-01 DIAGNOSIS — I712 Thoracic aortic aneurysm, without rupture: Secondary | ICD-10-CM | POA: Insufficient documentation

## 2019-12-01 DIAGNOSIS — R4182 Altered mental status, unspecified: Secondary | ICD-10-CM | POA: Diagnosis not present

## 2019-12-01 DIAGNOSIS — F4322 Adjustment disorder with anxiety: Secondary | ICD-10-CM | POA: Diagnosis present

## 2019-12-01 DIAGNOSIS — I10 Essential (primary) hypertension: Secondary | ICD-10-CM | POA: Insufficient documentation

## 2019-12-01 DIAGNOSIS — Z79899 Other long term (current) drug therapy: Secondary | ICD-10-CM | POA: Diagnosis not present

## 2019-12-01 DIAGNOSIS — R1011 Right upper quadrant pain: Secondary | ICD-10-CM | POA: Diagnosis not present

## 2019-12-01 DIAGNOSIS — R638 Other symptoms and signs concerning food and fluid intake: Secondary | ICD-10-CM | POA: Diagnosis present

## 2019-12-01 DIAGNOSIS — Z20822 Contact with and (suspected) exposure to covid-19: Secondary | ICD-10-CM | POA: Diagnosis not present

## 2019-12-01 DIAGNOSIS — Z7901 Long term (current) use of anticoagulants: Secondary | ICD-10-CM | POA: Insufficient documentation

## 2019-12-01 DIAGNOSIS — K8020 Calculus of gallbladder without cholecystitis without obstruction: Secondary | ICD-10-CM | POA: Diagnosis not present

## 2019-12-01 DIAGNOSIS — J449 Chronic obstructive pulmonary disease, unspecified: Secondary | ICD-10-CM | POA: Diagnosis not present

## 2019-12-01 DIAGNOSIS — Z8673 Personal history of transient ischemic attack (TIA), and cerebral infarction without residual deficits: Secondary | ICD-10-CM | POA: Diagnosis not present

## 2019-12-01 DIAGNOSIS — E860 Dehydration: Secondary | ICD-10-CM

## 2019-12-01 DIAGNOSIS — G4700 Insomnia, unspecified: Secondary | ICD-10-CM

## 2019-12-01 DIAGNOSIS — E119 Type 2 diabetes mellitus without complications: Secondary | ICD-10-CM

## 2019-12-01 DIAGNOSIS — R5381 Other malaise: Secondary | ICD-10-CM | POA: Diagnosis present

## 2019-12-01 LAB — CBC
HCT: 38.3 % — ABNORMAL LOW (ref 39.0–52.0)
Hemoglobin: 13.3 g/dL (ref 13.0–17.0)
MCH: 31.1 pg (ref 26.0–34.0)
MCHC: 34.7 g/dL (ref 30.0–36.0)
MCV: 89.5 fL (ref 80.0–100.0)
Platelets: 181 10*3/uL (ref 150–400)
RBC: 4.28 MIL/uL (ref 4.22–5.81)
RDW: 13.8 % (ref 11.5–15.5)
WBC: 5.6 10*3/uL (ref 4.0–10.5)
nRBC: 0 % (ref 0.0–0.2)

## 2019-12-01 LAB — BASIC METABOLIC PANEL
Anion gap: 12 (ref 5–15)
BUN: 19 mg/dL (ref 8–23)
CO2: 23 mmol/L (ref 22–32)
Calcium: 9.2 mg/dL (ref 8.9–10.3)
Chloride: 104 mmol/L (ref 98–111)
Creatinine, Ser: 1.11 mg/dL (ref 0.61–1.24)
GFR, Estimated: >60 mL/min (ref >60)
Glucose, Bld: 170 mg/dL — ABNORMAL HIGH (ref 70–99)
Potassium: 3.6 mmol/L (ref 3.5–5.1)
Sodium: 139 mmol/L (ref 135–145)

## 2019-12-01 LAB — POCT URINALYSIS DIP (MANUAL ENTRY)
Blood, UA: NEGATIVE
Glucose, UA: NEGATIVE mg/dL
Ketones, POC UA: NEGATIVE mg/dL
Leukocytes, UA: NEGATIVE
Nitrite, UA: NEGATIVE
Spec Grav, UA: >=1.03 — AB (ref 1.010–1.025)
Urobilinogen, UA: 0.2 E.U./dL
pH, UA: 5.5 (ref 5.0–8.0)

## 2019-12-01 NOTE — Telephone Encounter (Signed)
Pt's wife came by and states that behavorial health dr wants pt to be seen for possible UTI-no available appts today/wife agreed to take [facility] to [facility].

## 2019-12-01 NOTE — Progress Notes (Signed)
[name] [id number] 01-Jul-1950 69 y.o.  Subjective:   Patient ID:  Alexander Barton is a 69 y.o. (DOB 1950-04-23) male.  Chief Complaint: No chief complaint on file.   HPI Roman Dubuc Mitchner presents to the office today for follow-up of MDD and insomnia.  Accompanied by wife.   Describes mood today as "ok". Pleasant. Mood symptoms - reports feeling depressed and anxious. Irritable at times. Decreased worry - "not as bad as it was". Feels weak. Stating I don't feel good". Wanting lights left on because he is scared he may fall. Can't think clearly. Restarted Haldol and is taking 2.78m at hs. Denies altered mental status, pressured speech, or hyper-religiosity. Wife notes more irritability and frustration over past week. Decreased interest and motivation. Taking medications as prescribed. Willing to try increase Haldol to stabilize mood symptoms. Energy levels lower. Active, does not have a current exercise routine.  Enjoys some usual interests and activities. Married. Lives with wife of 551years. 2 children and 3 grandsons. Spending time with family. Appetite adequate. Eating twice a day - "a few bites". Not drinking much water. Weight loss. Sleeps well most nights. Averages 3 to 4 hours on a good night. Taking cat naps during the day per wife - patient disagrees.  Focus and concentration difficulties. Normally reads during the day - can't remember anything he reads. Completing tasks. Unable to manage aspects of household. 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  Denies AH or VH.  Previous medication trials: 2008 - Seroquel - allergy. Haldol 572mat bedtime Cogentin.   Mini-Mental     Clinical Support [facility] [facility] '30 30 30    ' PHQ2-9     [facility]  [facility]  PHQ-2 Total Score 0 0 0 0 2  PHQ-9 Total Score -- -- -- 8 7       Review of Systems:  Review of Systems  Constitutional: Positive for activity change and fatigue.  Neurological: Positive for syncope, weakness and light-headedness.  Psychiatric/Behavioral: Positive for agitation, decreased concentration and sleep disturbance.    Medications: I have reviewed the patient's current medications.  Current Outpatient Medications  Medication Sig Dispense Refill  . acetaminophen (TYLENOL) 500 MG tablet Take 1,000 mg by mouth every 6 (six) hours as needed for mild pain.    . Marland Kitchenlbuterol (PROAIR HFA) 108 (90 Base) MCG/ACT inhaler Inhale 2 puffs into the lungs every 4 (four) hours as needed for wheezing or shortness of breath.    . benztropine (COGENTIN) 1 MG tablet Take 1 tablet (1 mg total) by mouth daily as needed for tremors. 10 tablet 0  . blood glucose meter kit and supplies KIT Dispense based on patient and insurance preference. Use twice daily. (FOR ICD-10 E11.9). 1 each 0  . Budeson-Glycopyrrol-Formoterol (BREZTRI AEROSPHERE) 160-9-4.8 MCG/ACT AERO Inhale 2 puffs into the lungs in the morning and at bedtime. 10.7 g 3  . budesonide-formoterol (SYMBICORT) 160-4.5 MCG/ACT inhaler Inhale 2 puffs into the lungs 2 (two) times daily.    . clobetasol ointment (TEMOVATE) 0.05 %  Apply 1 application topically daily as needed (to affected areas- for rashes).     Marland Kitchen glucose blood test strip Use twice daily E11.9 100 each 1  . haloperidol (HALDOL) 5 MG tablet Take 1 tablet (5 mg total) by mouth at bedtime. 30 tablet 0  . Investigational - Study Medication Take 1 tablet by mouth See admin instructions.  Study name: [id number] Additional study details: cholesterol [facility] [phone]- Take 1 tablet by mouth in the morning    . melatonin 5 MG TABS Take 1-2 tablets (5-10 mg) by mouth daily at bedtime. 30 tablet 0  . mirtazapine (REMERON) 15 MG tablet Take 1 tablet (15 mg total) by mouth at bedtime. 30 tablet 0  . OVER THE COUNTER MEDICATION Take 1 capsule by mouth See admin instructions. Dotera Digestive Supplement capsule- Take 1 capsule by mouth in the morning    . polyethylene glycol (MIRALAX / GLYCOLAX) 17 g packet Take 17 g by mouth in the morning. Trion    . senna (SENOKOT) 8.6 MG TABS tablet Take 2 tablets by mouth in the morning and at bedtime.    . tamsulosin (FLOMAX) 0.4 MG CAPS capsule TAKE 2 CAPSULES BY MOUTH EVERY DAY 174 capsule 1  . Tiotropium Bromide Monohydrate (SPIRIVA RESPIMAT) 2.5 MCG/ACT AERS Inhale 1 puff into the lungs in the morning and at bedtime.     Marland Kitchen warfarin (COUMADIN) 2 MG tablet Take 2 mg by mouth See admin instructions. Take 2 mg by mouth in the late evening on Saturday ONLY    . warfarin (COUMADIN) 5 MG tablet Take 2 tablets daily or as directed. 60 tablet 2   No current facility-administered medications for this visit.    Medication Side Effects: Other: Dizziness  Allergies:  Allergies  Allergen Reactions  . Codeine Anaphylaxis  . Gadolinium Derivatives Other (See Comments)    Intense sneezing from MRI contrast  . Hydrocodone Anaphylaxis  . Iodinated Diagnostic Agents Shortness Of Breath and Other (See Comments)    Uncontrollable sneezing and "shortness of breath after prep"- needs sedative the night before and hour and benadryl before using  . Other Other (See Comments)    Unnamed anesthesia- Patient is in the E.D. possibly due to the effects of it: Delirium, confusion, and sleep deprivation  . Statins Other (See Comments)    Muscle pain  . Hydromorphone Other (See Comments)    MUSCLE PAIN  . Lipitor [Atorvastatin Calcium] Other (See  Comments)    Muscle pain and a "knot" appears on his right side, near the ribcage  . Seroquel [Quetiapine Fumerate] Swelling and Other (See Comments)    "bad trip" and tongue became swollen    Past Medical History:  Diagnosis Date  . Aortic aneurysm, thoracic ([place])   . Aortic valve disease   . Arthritis   . BPH (benign prostatic hyperplasia)   . COPD (chronic obstructive pulmonary disease) ([place])   . GERD (gastroesophageal reflux disease)   . HTN (hypertension)   . Hypercholesterolemia   . Stroke ([place])   . TIA (transient ischemic attack)     Family History  Problem Relation Age of Onset  . Alzheimer's disease Father   . Diabetes Father        age onset DM  . Hypertension Sister   . Hypertension Brother   . Diabetes Mother   . Diabetes Sister   . Colon cancer Neg Hx     Social History   Socioeconomic History  . Marital status: Married  Spouse name: Not on file  . Number of children: 2  . Years of education: Not on file  . Highest education level: Not on file  Occupational History  . Occupation: Scientist, clinical (histocompatibility and immunogenetics): [employer]  Tobacco Use  . Smoking status: Never Smoker  . Smokeless tobacco: Never Used  Substance and Sexual Activity  . Alcohol use: No    Alcohol/week: 0.0 standard drinks  . Drug use: No  . Sexual activity: Not Currently  Other Topics Concern  . 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   One son and one daughter   He lives in [place]   One caffeinated beverage daily   08/30/2014   Social Determinants of Health   Financial Resource Strain:   . Difficulty of Paying Living Expenses: Not on file  Food Insecurity:   . Worried About Charity fundraiser in the Last Year: Not on file  . Ran Out of Food in the Last Year: Not on file  Transportation Needs:   . Lack of Transportation (Medical): Not on file  . Lack of Transportation (Non-Medical):  Not on file  Physical Activity:   . Days of Exercise per Week: Not on file  . Minutes of Exercise per Session: Not on file  Stress:   . Feeling of Stress : Not on file  Social Connections:   . Frequency of Communication with Friends and Family: Not on file  . Frequency of Social Gatherings with Friends and Family: Not on file  . Attends Religious Services: Not on file  . Active Member of Clubs or Organizations: Not on file  . Attends Archivist Meetings: Not on file  . Marital Status: Not on file  Intimate Partner Violence:   . Fear of Current or Ex-Partner: Not on file  . Emotionally Abused: Not on file  . Physically Abused: Not on file  . Sexually Abused: Not on file    Past Medical History, Surgical history, Social history, and Family history were reviewed and updated as appropriate.   Please see review of systems for further details on the patient's review from today.   Objective:   Physical Exam:  There were no vitals taken for this visit.  Physical Exam Constitutional:      General: He is not in acute distress. Musculoskeletal:        General: No deformity.  Neurological:     Mental Status: He is alert and oriented to person, place, and time.     Coordination: Coordination normal.  Psychiatric:        Attention and Perception: Attention and perception normal. He does not perceive auditory or visual hallucinations.        Mood and Affect: Mood normal. Mood is not anxious or depressed. Affect is flat. Affect is not labile, blunt, angry or inappropriate.        Speech: Speech is delayed.        Behavior: Behavior normal.        Thought Content: Thought content normal. Thought content is not paranoid or delusional. Thought content does not include homicidal or suicidal ideation. Thought content does not include homicidal or suicidal plan.        Cognition and Memory: Cognition and memory normal.        Judgment: Judgment normal.     Comments: Insight intact  Lab Review:     Component Value Date/Time   NA 133 (L) 11/14/2019 1615   NA 137 01/10/2019 1228   K 4.2 11/14/2019 1615   CL 98 11/14/2019 1615   CO2 27 11/14/2019 1615   GLUCOSE 161 (H) 11/14/2019 1615   BUN 19 11/14/2019 1615   BUN 14 01/10/2019 1228   CREATININE 1.07 11/14/2019 1615   CALCIUM 9.4 11/14/2019 1615   PROT 7.1 09/22/2019 1831   PROT 6.6 01/10/2019 1228   ALBUMIN 3.9 09/22/2019 1831   ALBUMIN 3.7 (L) 01/10/2019 1228   AST 22 09/22/2019 1831   ALT 16 09/22/2019 1831   ALKPHOS 61 09/22/2019 1831   BILITOT 0.7 09/22/2019 1831   BILITOT 0.8 01/10/2019 1228   GFRNONAA >60 09/22/2019 1831   GFRAA >60 09/22/2019 1831       Component Value Date/Time   WBC 6.0 09/22/2019 1831   RBC 4.08 (L) 09/22/2019 1831   HGB 12.3 (L) 09/22/2019 1831   HGB 9.3 (L) 01/10/2019 1228   HCT 37.2 (L) 09/22/2019 1831   HCT 28.4 (L) 01/10/2019 1228   PLT 195 09/22/2019 1831   PLT 305 01/10/2019 1228   MCV 91.2 09/22/2019 1831   MCV 90 01/10/2019 1228   MCH 30.1 09/22/2019 1831   MCHC 33.1 09/22/2019 1831   RDW 14.6 09/22/2019 1831   RDW 14.3 01/10/2019 1228   LYMPHSABS 2.0 09/22/2019 1831   MONOABS 0.6 09/22/2019 1831   EOSABS 0.1 09/22/2019 1831   BASOSABS 0.0 09/22/2019 1831    No results found for: POCLITH, LITHIUM   No results found for: PHENYTOIN, PHENOBARB, VALPROATE, CBMZ   .res Assessment: Plan:    Plan:  PDMP reviewed  1. Continue Haldol at 2.15m at hs - plan to increase to 584m  Will send patient to PCP office/urgent care for evaluation.   RTC 4 weeks  Patient advised to contact office with any questions, adverse effects, or acute worsening in signs and symptoms.    Diagnoses and all orders for this visit:  Major depressive disorder, recurrent episode, moderate (HCC)  Insomnia, unspecified type     Please see After Visit Summary for patient specific instructions.  Future Appointments  Date Time Provider DeBlanco12/17/2021   8:30 AM O'Dia CrawfordLPN [facility]  02/08/67/79481:00 AM SoLeone HavenMD [facility]  02/29/2020  3:40 PM JoMartiniquePeter M, MD [facility]  No orders of the defined types were placed in this encounter.   -------------------------------

## 2019-12-01 NOTE — Discharge Instructions (Signed)
Consider going to the emergency department for evaluation of dehydration.

## 2019-12-01 NOTE — ED Notes (Signed)
First Nurse Note;  Per pt's wife, pt has been sent over via POV from Urgent Care due to concerns for dehydration.  NAD noted at this time.

## 2019-12-01 NOTE — ED Provider Notes (Signed)
Alexander Barton    CSN: [id number] Arrival date & time: 12/01/19  1541      History   Chief Complaint Chief Complaint  Patient presents with  . Urinary Tract Infection    HPI Alexander Barton is a 69 y.o. male.   Accompanied by his wife, patient presents for recheck of his urine.  His wife states he has had increased confusion x1 week.  She states his psychologist instructed her to come here for urine check to rule out UTI.  She states he is not drinking water at home and has decreased urine output.  Patient and his wife deny fever, chills, abdominal pain, back pain, or other symptoms.  The history is provided by the patient, the spouse and medical records.    Past Medical History:  Diagnosis Date  . Aortic aneurysm, thoracic ([place])   . Aortic valve disease   . Arthritis   . BPH (benign prostatic hyperplasia)   . COPD (chronic obstructive pulmonary disease) ([place])   . GERD (gastroesophageal reflux disease)   . HTN (hypertension)   . Hypercholesterolemia   . Stroke ([place])   . TIA (transient ischemic attack)     Patient Active Problem List   Diagnosis Date Noted  . Anxiety and depression 11/17/2019  . AMS (altered mental status) 09/21/2019  . Gallstone pancreatitis 09/21/2019  . Sleep disturbance 09/21/2019  . Asthma-COPD overlap syndrome ([place]) 08/26/2019  . Environmental exposure 08/26/2019  . Idiopathic hypoparathyroidism ([place]) 06/29/2019  . PVC's (premature ventricular contractions) 06/09/2019  . Pancreatic pseudocyst   . Intractable abdominal pain 05/30/2019  . Nausea and vomiting 05/30/2019  . Hyperglycemia 05/30/2019  . Pancreatic cyst   . Bilateral finger numbness 01/20/2019  . Headache 01/20/2019  . Night sweats 01/20/2019  . Swelling of male genital structure 01/20/2019  . Educated about COVID-19 virus infection 01/05/2019  . Cardiomyopathy ([place]) 01/05/2019  . UTI (urinary tract infection) 12/20/2018  . COVID-19 11/10/2018  . Fall 05/07/2018    . Costochondritis 01/04/2018  . Thyroid nodule 01/04/2018  . Dissecting aneurysm of thoracic aorta, Stanford type B ([place]) 11/08/2017  . Anemia 11/08/2017  . Hyponatremia 11/08/2017  . Overweight 07/01/2017  . History of stroke   . GERD (gastroesophageal reflux disease)   . Arthritis   . Neck pain 12/01/2016  . Knee osteoarthritis 12/01/2016  . Physical deconditioning 06/24/2016  . Dyspnea on exertion 06/23/2016  . Chronic pain of right knee 12/18/2015  . Chronic obstructive pulmonary disease ([place]) 11/14/2014  . BPH (benign prostatic hyperplasia) 11/14/2014  . Preventative health care 11/14/2014  . Long term current use of anticoagulant therapy 10/29/2013  . Type 2 diabetes mellitus without complications ([place]) 47/82/9562  . TIA (transient ischemic attack) 06/03/2012  . H/O aortic valve replacement 12/26/2011  . History of aortic arch replacement 01/07/2011  . HTN (hypertension)   . Hyperlipidemia   . Aortic valve disease     Past Surgical History:  Procedure Laterality Date  . ACHILLES TENDON REPAIR    . AORTIC VALVE REPLACEMENT     #23 On-X valve conduit  . ASCENDING AORTIC ANEURYSM REPAIR    . BACK SURGERY    . FOOT SURGERY         Home Medications    Prior to Admission medications   Medication Sig Start Date End Date Taking? Authorizing Provider  acetaminophen (TYLENOL) 500 MG tablet Take 1,000 mg by mouth every 6 (six) hours as needed for mild pain.    [provider]  albuterol (PROAIR HFA) 108 (90 Base) MCG/ACT inhaler Inhale 2 puffs into the lungs every 4 (four) hours as needed for wheezing or shortness of breath.    [provider]  benztropine (COGENTIN) 1 MG tablet Take 1 tablet (1 mg total) by mouth daily as needed for tremors. 09/27/19   Connye Burkitt, NP  blood glucose meter kit and supplies KIT Dispense based on patient and insurance preference. Use twice daily. (FOR ICD-10 E11.9). 06/08/19   Leone Haven, MD   Budeson-Glycopyrrol-Formoterol (BREZTRI AEROSPHERE) 160-9-4.8 MCG/ACT AERO Inhale 2 puffs into the lungs in the morning and at bedtime. 08/26/19   Lauraine Rinne, NP  budesonide-formoterol (SYMBICORT) 160-4.5 MCG/ACT inhaler Inhale 2 puffs into the lungs 2 (two) times daily.    [provider]  clobetasol ointment (TEMOVATE) 9.41 % Apply 1 application topically daily as needed (to affected areas- for rashes).  05/25/19   [provider]  glucose blood test strip Use twice daily E11.9 07/20/19   Leone Haven, MD  haloperidol (HALDOL) 5 MG tablet Take 1 tablet (5 mg total) by mouth at bedtime. 10/20/19   Leone Haven, MD  Investigational - Study Medication Take 1 tablet by mouth See admin instructions. Study name: [id number] Additional study details: cholesterol [facility] [phone] Take 1 tablet by mouth in the morning    [provider]  melatonin 5 MG TABS Take 1-2 tablets (5-10 mg) by mouth daily at bedtime. 09/27/19   Connye Burkitt, NP  mirtazapine (REMERON) 15 MG tablet Take 1 tablet (15 mg total) by mouth at bedtime. 11/15/19   [place], Berdie Ogren, NP  OVER THE COUNTER MEDICATION Take 1 capsule by mouth See admin instructions. Dotera Digestive Supplement capsule- Take 1 capsule by mouth in the morning    [provider]  polyethylene glycol (MIRALAX / GLYCOLAX) 17 g packet Take 17 g by mouth in the morning. [place]    [provider]  senna (SENOKOT) 8.6 MG TABS tablet Take 2 tablets by mouth in the morning and at bedtime.    [provider]  tamsulosin (FLOMAX) 0.4 MG CAPS capsule TAKE 2 CAPSULES BY MOUTH EVERY DAY 09/26/19   Leone Haven, MD  Tiotropium Bromide Monohydrate (SPIRIVA RESPIMAT) 2.5 MCG/ACT AERS Inhale 1 puff into the lungs in the morning and at bedtime.     [provider]  warfarin (COUMADIN) 2 MG tablet Take 2 mg by mouth See admin instructions. Take 2 mg by mouth in the late evening  on Saturday ONLY 09/13/19   [provider]  warfarin (COUMADIN) 5 MG tablet Take 2 tablets daily or as directed. 11/04/19   [name], [name] M, MD  enoxaparin (LOVENOX) 80 MG/0.8ML injection Inject 0.8 mLs (80 mg total) into the skin every 12 (twelve) hours. Patient not taking: Reported on 09/22/2019 07/29/19 09/27/19  [name], [name] M, MD    Family History Family History  Problem Relation Age of Onset  . Alzheimer's disease Father   . Diabetes Father        age onset DM  . Hypertension Sister   . Hypertension Brother   . Diabetes Mother   . Diabetes Sister   . Colon cancer Neg Hx     Social History Social History   Tobacco Use  . Smoking status: Never Smoker  . Smokeless tobacco: Never Used  Substance Use Topics  . Alcohol use: No    Alcohol/week: 0.0 standard drinks  . Drug use: No  Allergies   Codeine, Gadolinium derivatives, Hydrocodone, Iodinated diagnostic agents, Other, Statins, Hydromorphone, Lipitor [atorvastatin calcium], and Seroquel [quetiapine fumerate]   Review of Systems Review of Systems  Constitutional: Positive for appetite change. Negative for chills and fever.  HENT: Negative for ear pain and sore throat.   Eyes: Negative for pain and visual disturbance.  Respiratory: Negative for cough and shortness of breath.   Cardiovascular: Negative for chest pain and palpitations.  Gastrointestinal: Negative for abdominal pain, nausea and vomiting.  Genitourinary: Negative for dysuria, flank pain and hematuria.  Musculoskeletal: Negative for arthralgias and back pain.  Skin: Negative for color change and rash.  Neurological: Negative for seizures and syncope.  Psychiatric/Behavioral: Positive for confusion.  All other systems reviewed and are negative.    Physical Exam Triage Vital Signs ED Triage Vitals  Enc Vitals Group     BP 12/01/19 1630 (!) 143/82     Pulse Rate 12/01/19 1630 96     Resp 12/01/19 1630 16     Temp 12/01/19 1630 98.1 F  (36.7 C)     Temp src --      SpO2 12/01/19 1630 98 %     Weight 12/01/19 1631 192 lb 0.3 oz (87.1 kg)     Height 12/01/19 1631 6' 1" (1.854 m)     Head Circumference --      Peak Flow --      Pain Score 12/01/19 1631 0     Pain Loc --      Pain Edu? --      Excl. in [place]? --    No data found.  Updated Vital Signs BP (!) 143/82   Pulse 96   Temp 98.1 F (36.7 C)   Resp 16   Ht 6' 1" (1.854 m)   Wt 192 lb 0.3 oz (87.1 kg)   SpO2 98%   BMI 25.33 kg/m   Visual Acuity Right Eye Distance:   Left Eye Distance:   Bilateral Distance:    Right Eye Near:   Left Eye Near:    Bilateral Near:     Physical Exam Vitals and nursing note reviewed.  Constitutional:      General: He is not in acute distress.    Appearance: He is well-developed.  HENT:     Head: Normocephalic and atraumatic.     Mouth/Throat:     Mouth: Mucous membranes are dry.     Pharynx: Oropharynx is clear.  Eyes:     Conjunctiva/sclera: Conjunctivae normal.  Cardiovascular:     Rate and Rhythm: Normal rate and regular rhythm.     Heart sounds: Normal heart sounds.  Pulmonary:     Effort: Pulmonary effort is normal. No respiratory distress.     Breath sounds: Normal breath sounds.  Abdominal:     Palpations: Abdomen is soft.     Tenderness: There is no abdominal tenderness. There is no right CVA tenderness, left CVA tenderness, guarding or rebound.  Musculoskeletal:     Cervical back: Neck supple.  Skin:    General: Skin is warm and dry.  Neurological:     Mental Status: He is alert.     Comments: Ambulatory with walker.  Psychiatric:        Mood and Affect: Mood normal.        Behavior: Behavior normal.      UC Treatments / Results  Labs (all labs ordered are listed, but only abnormal results are displayed) Labs Reviewed  POCT URINALYSIS DIP (  MANUAL ENTRY) - Abnormal; Notable for the following components:      Result Value   Clarity, UA cloudy (*)    Bilirubin, UA small (*)    Spec Grav,  UA >=1.030 (*)    Protein Ur, POC trace (*)    All other components within normal limits    EKG   Radiology No results found.  Procedures Procedures (including critical care time)  Medications Ordered in UC Medications - No data to display  Initial Impression / Assessment and Plan / UC Course  I have reviewed the triage vital signs and the nursing notes.  Pertinent labs & imaging results that were available during my care of the patient were reviewed by me and considered in my medical decision making (see chart for details).   Dehydration.  Urine does not show signs of infection.  Discussed with wife that she should take the patient to the ED for evaluation of dehydration.  She states the psychologist is going to call her back this evening and she will discuss it with her then and make a decision.   Final Clinical Impressions(s) / UC Diagnoses   Final diagnoses:  Dehydration     Discharge Instructions     Consider going to the emergency department for evaluation of dehydration.    ED Prescriptions    None     PDMP not reviewed this encounter.   Sharion Balloon, NP 12/01/19 [phone]

## 2019-12-01 NOTE — ED Triage Notes (Signed)
Per wife, pt has not been drinking any fluids x1 week. Pt has been confused in the last week. Pt has gotten so weak he has been using a walker.  Pt was seen by psychiatrist today and advise pt to be evaluated for UTI.

## 2019-12-01 NOTE — ED Triage Notes (Signed)
Pt comes into the ED via POV with his wife c/o possible dehydration and UTI.  Pt was sent by UC where they ran a UA.  No blood work obtained at that time, but they feel as though the patient is very dehydrated based off his urine production.  Pt admits he hasn't been drinking much water and has decreased urine output x 1 week.  Pt denies any pain, N/V/Dizziness, CP, or SHOB.  Pt in NAD at this time with even and unlabored respirations.

## 2019-12-01 NOTE — Telephone Encounter (Signed)
Called and spoke with patient's wife. She plans to proceed to the ED as suggested by Urgent Care today for dehydration.

## 2019-12-02 ENCOUNTER — Emergency Department: Payer: Medicare PPO

## 2019-12-02 ENCOUNTER — Telehealth: Payer: Self-pay | Admitting: Adult Health

## 2019-12-02 DIAGNOSIS — J449 Chronic obstructive pulmonary disease, unspecified: Secondary | ICD-10-CM | POA: Diagnosis not present

## 2019-12-02 DIAGNOSIS — E119 Type 2 diabetes mellitus without complications: Secondary | ICD-10-CM

## 2019-12-02 DIAGNOSIS — R638 Other symptoms and signs concerning food and fluid intake: Secondary | ICD-10-CM | POA: Diagnosis present

## 2019-12-02 DIAGNOSIS — R5381 Other malaise: Secondary | ICD-10-CM

## 2019-12-02 DIAGNOSIS — F331 Major depressive disorder, recurrent, moderate: Secondary | ICD-10-CM | POA: Diagnosis present

## 2019-12-02 DIAGNOSIS — K8020 Calculus of gallbladder without cholecystitis without obstruction: Secondary | ICD-10-CM | POA: Diagnosis not present

## 2019-12-02 DIAGNOSIS — F4322 Adjustment disorder with anxiety: Secondary | ICD-10-CM | POA: Diagnosis present

## 2019-12-02 LAB — URINALYSIS, COMPLETE (UACMP) WITH MICROSCOPIC
Bilirubin Urine: NEGATIVE
Glucose, UA: 50 mg/dL — AB
Hgb urine dipstick: NEGATIVE
Ketones, ur: 5 mg/dL — AB
Leukocytes,Ua: NEGATIVE
Nitrite: NEGATIVE
Protein, ur: NEGATIVE mg/dL
Specific Gravity, Urine: 1.03 (ref 1.005–1.030)
Squamous Epithelial / HPF: NONE SEEN (ref 0–5)
WBC, UA: NONE SEEN WBC/hpf (ref 0–5)
pH: 5 (ref 5.0–8.0)

## 2019-12-02 LAB — TROPONIN I (HIGH SENSITIVITY)
Troponin I (High Sensitivity): 29 ng/L — ABNORMAL HIGH (ref <18)
Troponin I (High Sensitivity): 12 ng/L (ref <18)

## 2019-12-02 LAB — RESPIRATORY PANEL BY RT PCR (FLU A&B, COVID)
Influenza A by PCR: NEGATIVE
Influenza B by PCR: NEGATIVE
SARS Coronavirus 2 by RT PCR: NEGATIVE

## 2019-12-02 LAB — LIPASE, BLOOD: Lipase: 26 U/L (ref 11–51)

## 2019-12-02 LAB — PROTIME-INR
INR: 2 — ABNORMAL HIGH (ref 0.8–1.2)
Prothrombin Time: 22.3 seconds — ABNORMAL HIGH (ref 11.4–15.2)

## 2019-12-02 MED ORDER — HALOPERIDOL 2 MG PO TABS
2.5000 mg | ORAL_TABLET | Freq: Every day | ORAL | Status: DC
  Administered 2019-12-02: 2.5 mg via ORAL
  Filled 2019-12-02 (×2): qty 1

## 2019-12-02 MED ORDER — WARFARIN SODIUM 10 MG PO TABS
10.0000 mg | ORAL_TABLET | ORAL | Status: DC
  Administered 2019-12-02: 10 mg via ORAL
  Filled 2019-12-02 (×2): qty 1

## 2019-12-02 MED ORDER — WARFARIN SODIUM 6 MG PO TABS
7.0000 mg | ORAL_TABLET | ORAL | Status: DC
  Filled 2019-12-02: qty 1

## 2019-12-02 MED ORDER — SODIUM CHLORIDE 0.9 % IV BOLUS
1000.0000 mL | Freq: Once | INTRAVENOUS | Status: AC
  Administered 2019-12-02: 1000 mL via INTRAVENOUS

## 2019-12-02 MED ORDER — ACETAMINOPHEN 325 MG PO TABS
ORAL_TABLET | ORAL | Status: AC
  Filled 2019-12-02: qty 2

## 2019-12-02 MED ORDER — MOMETASONE FURO-FORMOTEROL FUM 100-5 MCG/ACT IN AERO
2.0000 | INHALATION_SPRAY | Freq: Two times a day (BID) | RESPIRATORY_TRACT | Status: DC
  Administered 2019-12-03: 2 via RESPIRATORY_TRACT
  Filled 2019-12-02 (×2): qty 8.8

## 2019-12-02 MED ORDER — ONDANSETRON HCL 4 MG PO TABS: 4.0000 mg | ORAL_TABLET | Freq: Four times a day (QID) | ORAL | Status: DC | PRN

## 2019-12-02 MED ORDER — TAMSULOSIN HCL 0.4 MG PO CAPS
0.4000 mg | ORAL_CAPSULE | Freq: Two times a day (BID) | ORAL | Status: DC
  Administered 2019-12-03: 0.4 mg via ORAL
  Filled 2019-12-02: qty 1

## 2019-12-02 MED ORDER — TAMSULOSIN HCL 0.4 MG PO CAPS
0.8000 mg | ORAL_CAPSULE | Freq: Every day | ORAL | Status: DC
  Administered 2019-12-02: 0.8 mg via ORAL
  Filled 2019-12-02: qty 2

## 2019-12-02 MED ORDER — ACETAMINOPHEN 500 MG PO TABS: 1000.0000 mg | ORAL_TABLET | Freq: Four times a day (QID) | ORAL | Status: DC | PRN

## 2019-12-02 MED ORDER — WARFARIN - PHYSICIAN DOSING INPATIENT
Freq: Every day | Status: DC
  Filled 2019-12-02: qty 1

## 2019-12-02 MED ORDER — ONDANSETRON HCL 4 MG/2ML IJ SOLN: 4.0000 mg | Freq: Four times a day (QID) | INTRAMUSCULAR | Status: DC | PRN

## 2019-12-02 MED ORDER — BUDESON-GLYCOPYRROL-FORMOTEROL 160-9-4.8 MCG/ACT IN AERO: 2.0000 | INHALATION_SPRAY | Freq: Every day | RESPIRATORY_TRACT | Status: DC

## 2019-12-02 MED ORDER — ACETAMINOPHEN 325 MG PO TABS
650.0000 mg | ORAL_TABLET | Freq: Once | ORAL | Status: AC
  Administered 2019-12-02: 650 mg via ORAL

## 2019-12-02 MED ORDER — WARFARIN SODIUM 2 MG PO TABS: 2.0000 mg | ORAL_TABLET | ORAL | Status: DC

## 2019-12-02 MED ORDER — WARFARIN SODIUM 5 MG PO TABS: 5.0000 mg | ORAL_TABLET | Freq: Once | ORAL | Status: DC

## 2019-12-02 MED ORDER — POLYETHYLENE GLYCOL 3350 17 G PO PACK
17.0000 g | PACK | Freq: Every morning | ORAL | Status: DC
  Filled 2019-12-02: qty 1

## 2019-12-02 MED ORDER — SODIUM CHLORIDE 0.9 % IV SOLN: INTRAVENOUS | Status: DC

## 2019-12-02 MED ORDER — ALBUTEROL SULFATE HFA 108 (90 BASE) MCG/ACT IN AERS
2.0000 | INHALATION_SPRAY | RESPIRATORY_TRACT | Status: DC | PRN
  Filled 2019-12-02: qty 6.7

## 2019-12-02 NOTE — Consult Note (Signed)
[facility] Consult   Reason for Consult: Consult for 69 year old man referred to the emergency room by his behavioral health provider because of concern about not eating or drinking Referring Physician:  [place] Patient Identification: Alexander Barton MRN:  [id number] Principal Diagnosis: Moderate recurrent major depression ([place]) Diagnosis:  Principal Problem:   Moderate recurrent major depression (HCC) Active Problems:   Decreased oral intake   Adjustment disorder with anxiety   Total Time spent with patient: 1 hour  Subjective:   Alexander Barton is a 69 y.o. male patient admitted with "I am just in terrible shape!"  HPI: Patient seen chart reviewed.  With the patient's consent also spoke at length with his wife.  73 year old man who was referred to the emergency room after telling his psychiatric provider that he was not eating or drinking and had become increasingly weak and confused.  They apparently were concerned about the possibility of a urinary tract infection.  Patient and wife tell a story of psychiatric and physical symptoms going back about a year.  About a year ago he had aortic surgery.  Afterwards he had several days of agitation during at least part of which there is report of being hyper religious and hyperverbal although also during the same time expressing depression and weakness.  There is seem to have been a little improvement but in April of this year he developed some GI problems with a diagnosis of a pancreatic cyst.  After receiving treatment for that he again developed some complaints of confusion mood problems and poor functioning.  In late August of this year he was getting ready for a vacation but then developed mood swings again with another marked episode of hyper religious behavior as well as subjective depression and hopelessness.  At that time he was referred to the [facility] emergency room.  He was initially thought to need psychiatric  hospitalization and was kept in the ER for several days but when no bed was available he was reassessed and discharged.  Since then he has been getting outpatient treatment from a provider at [facility] psychiatric.  They had diagnosed him with depression and initially started him on Zoloft.  Apparently he only took this for about 4 days because he felt it was making him dizzy.  He was also started on a modest dose of haloperidol.  I am not exactly clear what the thinking was about that since there was no definite report of psychosis that I can find but possibly they were looking for a stay for treatment for anxiety or delirium.  In any case in the last month the haloperidol has been the only medicine he has been consistently taking although they have cut the dose down to 2-1/2 mg because of complaints of oversedation.  In the last week patient has become, according to both the patient and his wife, much weaker.  He does not feel like doing anything he would normally do.  He did not go to a family gathering and has not stayed in contact with his elderly mother both of which are atypical behaviors.  He has been eating and drinking much less than usual.  On interview with the patient he is clearly extremely worried and anxious.  Very hyper focused on somatic concerns.  He tells me that he had been intentionally not eating or drinking well because he was afraid he was going to be incontinent in the bed.  Patient tells me that he had not wanted to  attend the family gatherings because he did not want his family to "see me like this".  He does endorse a bad mood but when asked about suicidal ideation has no thoughts or intent of hurting himself and is able to articulate multiple positive things in his life and things that he is looking forward to.  Patient is absolutely certain that his memory and cognitive function is extremely impaired.  Nevertheless he could remember 3 out of 3 objects immediately and easily recall  them after several minutes of conversation.  Furthermore he was at least as lucid and clear in his conversation as most people his age that I encounter.  There is no report of any suicidality.  No report of any hallucinations no clear report of any psychosis.  Patient and wife are both convinced that he has episodes like this after he has anesthesia.  They are also both very focused on concerns about possible side effects from medicine.  On work-up here in the emergency room the patient does not have any sign of urinary tract or other infection.  Past Psychiatric History: Although he had been considered for admission to the hospital earlier in the summer he has never had a psychiatric hospitalization.  Medicines that he can recall having taken include the Zoloft which he says made him feel dizzy and this modest dose of Haldol which was sedating but also seems to have calm down a little of his anxiety.  Years ago when he had a somewhat similar episode in 2008 he had been prescribed Seroquel which caused what sounds like possibly a delirium.  Risk to Self:   Risk to Others:   Prior Inpatient Therapy:   Prior Outpatient Therapy:    Past Medical History:  Past Medical History:  Diagnosis Date  . Aortic aneurysm, thoracic ([place])   . Aortic valve disease   . Arthritis   . BPH (benign prostatic hyperplasia)   . COPD (chronic obstructive pulmonary disease) ([place])   . GERD (gastroesophageal reflux disease)   . HTN (hypertension)   . Hypercholesterolemia   . Stroke ([place])   . TIA (transient ischemic attack)     Past Surgical History:  Procedure Laterality Date  . ACHILLES TENDON REPAIR    . AORTIC VALVE REPLACEMENT     #23 On-X valve conduit  . ASCENDING AORTIC ANEURYSM REPAIR    . BACK SURGERY    . FOOT SURGERY     Family History:  Family History  Problem Relation Age of Onset  . Alzheimer's disease Father   . Diabetes Father        age onset DM  . Hypertension Sister   . Hypertension  Brother   . Diabetes Mother   . Diabetes Sister   . Colon cancer Neg Hx    Family Psychiatric  History: No known family history of mental illness Social History:  Social History   Substance and Sexual Activity  Alcohol Use No  . Alcohol/week: 0.0 standard drinks     Social History   Substance and Sexual Activity  Drug Use No    Social History   Socioeconomic History  . Marital status: Married    Spouse name: Not on file  . Number of children: 2  . Years of education: Not on file  . Highest education level: Not on file  Occupational History  . Occupation: Scientist, clinical (histocompatibility and immunogenetics): [employer]  Tobacco Use  . Smoking status: Never Smoker  . Smokeless tobacco:  Never Used  Substance and Sexual Activity  . Alcohol use: No    Alcohol/week: 0.0 standard drinks  . Drug use: No  . Sexual activity: Not Currently  Other Topics Concern  . 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   One son and one daughter   He lives in [place]   One caffeinated beverage daily   08/30/2014   Social Determinants of Health   Financial Resource Strain:   . Difficulty of Paying Living Expenses: Not on file  Food Insecurity:   . Worried About Charity fundraiser in the Last Year: Not on file  . Ran Out of Food in the Last Year: Not on file  Transportation Needs:   . Lack of Transportation (Medical): Not on file  . Lack of Transportation (Non-Medical): Not on file  Physical Activity:   . Days of Exercise per Week: Not on file  . Minutes of Exercise per Session: Not on file  Stress:   . Feeling of Stress : Not on file  Social Connections:   . Frequency of Communication with Friends and Family: Not on file  . Frequency of Social Gatherings with Friends and Family: Not on file  . Attends Religious Services: Not on file  . Active Member of Clubs or Organizations: Not on file  . Attends Theatre manager Meetings: Not on file  . Marital Status: Not on file   Additional Social History:    Allergies:   Allergies  Allergen Reactions  . Codeine Anaphylaxis  . Gadolinium Derivatives Other (See Comments)    Intense sneezing from MRI contrast  . Hydrocodone Anaphylaxis  . Iodinated Diagnostic Agents Shortness Of Breath and Other (See Comments)    Uncontrollable sneezing and "shortness of breath after prep"- needs sedative the night before and hour and benadryl before using  . Other Other (See Comments)    Unnamed anesthesia- Patient is in the E.D. possibly due to the effects of it: Delirium, confusion, and sleep deprivation  . Statins Other (See Comments)    Muscle pain  . Antihistamines, Diphenhydramine-Type Other (See Comments)    Unable to urinate. All antihistamines.   . Hydromorphone Other (See Comments)    MUSCLE PAIN  . Lipitor [Atorvastatin Calcium] Other (See Comments)    Muscle pain and a "knot" appears on his right side, near the ribcage  . Seroquel [Quetiapine Fumerate] Swelling and Other (See Comments)    "bad trip" and tongue became swollen    Labs:  Results for orders placed or performed during the hospital encounter of 12/01/19 (from the past 48 hour(s))  Basic metabolic panel     Status: Abnormal   Collection Time: 12/01/19  6:36 PM  Result Value Ref Range   Sodium 139 135 - 145 mmol/L   Potassium 3.6 3.5 - 5.1 mmol/L   Chloride 104 98 - 111 mmol/L   CO2 23 22 - 32 mmol/L   Glucose, Bld 170 (H) 70 - 99 mg/dL    Comment: Glucose reference range applies only to samples taken after fasting for at least 8 hours.   BUN 19 8 - 23 mg/dL   Creatinine, Ser 1.11 0.61 - 1.24 mg/dL   Calcium 9.2 8.9 - 10.3 mg/dL   GFR, Estimated >60 >60 mL/min    Comment: (NOTE) Calculated using the CKD-EPI Creatinine Equation (2021)    Anion gap 12 5 -  15    Comment: Performed at [facility], [address] [place], [place] [address]  CBC     Status: Abnormal    Collection Time: 12/01/19  6:36 PM  Result Value Ref Range   WBC 5.6 4.0 - 10.5 K/uL   RBC 4.28 4.22 - 5.81 MIL/uL   Hemoglobin 13.3 13.0 - 17.0 g/dL   HCT 38.3 (L) 39 - 52 %   MCV 89.5 80.0 - 100.0 fL   MCH 31.1 26.0 - 34.0 pg   MCHC 34.7 30.0 - 36.0 g/dL   RDW 13.8 11.5 - 15.5 %   Platelets 181 150 - 400 K/uL   nRBC 0.0 0.0 - 0.2 %    Comment: Performed at [facility], [address] [place], [place] [address]  Lipase, blood     Status: None   Collection Time: 12/01/19  6:36 PM  Result Value Ref Range   Lipase 26 11 - 51 U/L    Comment: Performed at [facility], [address], [place] [address]  Troponin I (High Sensitivity)     Status: Abnormal   Collection Time: 12/01/19  6:36 PM  Result Value Ref Range   Troponin I (High Sensitivity) 29 (H) <18 ng/L    Comment: (NOTE) Elevated high sensitivity troponin I (hsTnI) values and significant  changes across serial measurements may suggest ACS but many other  chronic and acute conditions are known to elevate hsTnI results.  Refer to the "Links" section for chest pain algorithms and additional  guidance. Performed at [facility], [address] [place], [place] [address]   Urinalysis, Complete w Microscopic     Status: Abnormal   Collection Time: 12/02/19  2:52 AM  Result Value Ref Range   Color, Urine YELLOW (A) YELLOW   APPearance HAZY (A) CLEAR   Specific Gravity, Urine 1.030 1.005 - 1.030   pH 5.0 5.0 - 8.0   Glucose, UA 50 (A) NEGATIVE mg/dL   Hgb urine dipstick NEGATIVE NEGATIVE   Bilirubin Urine NEGATIVE NEGATIVE   Ketones, ur 5 (A) NEGATIVE mg/dL   Protein, ur NEGATIVE NEGATIVE mg/dL   Nitrite NEGATIVE NEGATIVE   Leukocytes,Ua NEGATIVE NEGATIVE   RBC / HPF 0-5 0 - 5 RBC/hpf   WBC, UA NONE SEEN 0 - 5 WBC/hpf   Bacteria, UA RARE (A) NONE SEEN   Squamous Epithelial / LPF NONE SEEN 0 - 5   Mucus PRESENT     Comment: Performed at [facility], [place], [place] [address]  Troponin I (High Sensitivity)     Status: None   Collection Time: 12/02/19  4:00 AM  Result Value Ref Range   Troponin I (High Sensitivity) 12 <18 ng/L    Comment: (NOTE) Elevated high sensitivity troponin I (hsTnI) values and significant  changes across serial measurements may suggest ACS but many other  chronic and acute conditions are known to elevate hsTnI results.  Refer to the "Links" section for chest pain algorithms and additional  guidance. Performed at [facility], [address] [place], [place] [address]   Respiratory Panel by RT PCR (Flu A&B, Covid) - Nasopharyngeal Swab     Status: None   Collection Time: 12/02/19  5:03 AM   Specimen: Nasopharyngeal Swab  Result Value Ref Range   SARS Coronavirus 2 by RT PCR NEGATIVE NEGATIVE    Comment: (NOTE) SARS-CoV-2 target nucleic acids are NOT DETECTED.  The SARS-CoV-2 RNA is generally detectable  in upper respiratoy specimens during the acute phase of infection. The lowest concentration of SARS-CoV-2 viral copies this assay can detect is 131 copies/mL. A negative result does not preclude SARS-Cov-2 infection and should not be used as the sole basis for treatment or other patient management decisions. A negative result may occur with  improper specimen collection/handling, submission of specimen other than nasopharyngeal swab, presence of viral mutation(s) within the areas targeted by this assay, and inadequate number of viral copies (<131 copies/mL). A negative result must be combined with clinical observations, patient history, and epidemiological information. The expected result is Negative.  Fact Sheet for Patients:  PinkCheek.be  Fact Sheet for Healthcare Providers:  GravelBags.it  This test is no t yet approved or cleared by the [place] FDA and  has been authorized for detection and/or diagnosis of SARS-CoV-2  by FDA under an Emergency Use Authorization (EUA). This EUA will remain  in effect (meaning this test can be used) for the duration of the COVID-19 declaration under Section 564(b)(1) of the Act, 21 U.S.C. section 360bbb-3(b)(1), unless the authorization is terminated or revoked sooner.     Influenza A by PCR NEGATIVE NEGATIVE   Influenza B by PCR NEGATIVE NEGATIVE    Comment: (NOTE) The Xpert Xpress SARS-CoV-2/FLU/RSV assay is intended as an aid in  the diagnosis of influenza from Nasopharyngeal swab specimens and  should not be used as a sole basis for treatment. Nasal washings and  aspirates are unacceptable for Xpert Xpress SARS-CoV-2/FLU/RSV  testing.  Fact Sheet for Patients: PinkCheek.be  Fact Sheet for Healthcare Providers: GravelBags.it  This test is not yet approved or cleared by the [place] FDA and  has been authorized for detection and/or diagnosis of SARS-CoV-2 by  FDA under an Emergency Use Authorization (EUA). This EUA will remain  in effect (meaning this test can be used) for the duration of the  Covid-19 declaration under Section 564(b)(1) of the Act, 21  U.S.C. section 360bbb-3(b)(1), unless the authorization is  terminated or revoked. Performed at [facility], [address] [place], [place] [address]     No current facility-administered medications for this encounter.   Current Outpatient Medications  Medication Sig Dispense Refill  . acetaminophen (TYLENOL) 500 MG tablet Take 1,000 mg by mouth every 6 (six) hours as needed for mild pain.    Marland Kitchen albuterol (PROAIR HFA) 108 (90 Base) MCG/ACT inhaler Inhale 2 puffs into the lungs every 4 (four) hours as needed for wheezing or shortness of breath.    . blood glucose meter kit and supplies KIT Dispense based on patient and insurance preference. Use twice daily. (FOR ICD-10 E11.9). 1 each 0  . Budeson-Glycopyrrol-Formoterol (BREZTRI  AEROSPHERE) 160-9-4.8 MCG/ACT AERO Inhale 2 puffs into the lungs in the morning and at bedtime. 10.7 g 3  . glucose blood test strip Use twice daily E11.9 100 each 1  . Investigational - Study Medication Take 1 tablet by mouth See admin instructions. Study name: [id number] Additional study details: cholesterol [facility] [id number]- Take 1 tablet by mouth in the morning    . OVER THE COUNTER MEDICATION Take 1 capsule by mouth See admin instructions. Dotera Digestive Supplement capsule- Take 1 capsule by mouth in the morning    . polyethylene glycol (MIRALAX / GLYCOLAX) 17 g packet Take 17 g by mouth in the morning. [place]    . senna (SENOKOT) 8.6 MG TABS tablet Take 2 tablets by mouth in the morning and at bedtime.    Marland Kitchen  tamsulosin (FLOMAX) 0.4 MG CAPS capsule TAKE 2 CAPSULES BY MOUTH EVERY DAY 174 capsule 1  . warfarin (COUMADIN) 2 MG tablet Take 2 mg by mouth See admin instructions. Take 2 mg by mouth in the late evening on Saturday ONLY    . warfarin (COUMADIN) 5 MG tablet Take 2 tablets daily or as directed. 60 tablet 2  . haloperidol (HALDOL) 5 MG tablet Take 1 tablet (5 mg total) by mouth at bedtime. (Patient taking differently: Take 2.5 mg by mouth at bedtime. ) 30 tablet 0    Musculoskeletal: Strength & Muscle Tone: decreased Gait & Station: unsteady Patient leans: N/A  Psychiatric Specialty Exam: Physical Exam Vitals and nursing note reviewed.  Constitutional:      Appearance: He is well-developed.  HENT:     Head: Normocephalic and atraumatic.  Eyes:     Conjunctiva/sclera: Conjunctivae normal.     Pupils: Pupils are equal, round, and reactive to light.  Cardiovascular:     Heart sounds: Normal heart sounds.  Pulmonary:     Effort: Pulmonary effort is normal.  Abdominal:     Palpations: Abdomen is soft.  Musculoskeletal:        General: Normal range of motion.     Cervical back: Normal range of motion.  Skin:    General: Skin is warm and dry.   Neurological:     General: No focal deficit present.     Mental Status: He is alert.  Psychiatric:        Attention and Perception: Attention normal.        Mood and Affect: Mood is anxious.        Speech: Speech normal.        Behavior: Behavior is agitated. Behavior is not aggressive or hyperactive.        Thought Content: Thought content is not delusional. Thought content does not include homicidal or suicidal ideation.        Cognition and Memory: Cognition is impaired.        Judgment: Judgment is inappropriate.     Review of Systems  Constitutional: Positive for appetite change and fatigue.  HENT: Negative.   Eyes: Negative.   Respiratory: Negative.   Cardiovascular: Negative.   Gastrointestinal: Negative.   Musculoskeletal: Negative.   Skin: Negative.   Neurological: Negative.   Psychiatric/Behavioral: Positive for confusion, dysphoric mood and sleep disturbance. The patient is nervous/anxious.     Blood pressure (!) 156/81, pulse 87, temperature 98.6 F (37 C), temperature source Oral, resp. rate 16, height _0  (1.854 m), weight 86.2 kg, SpO2 96 %.Body mass index is 25.07 kg/m.  General Appearance: Casual  Eye Contact:  Fair  Speech:  Pressured  Volume:  Increased  Mood:  Anxious and Irritable  Affect:  Congruent  Thought Process:  Coherent  Orientation:  Full (Time, Place, and Person)  Thought Content:  Rumination and Tangential  Suicidal Thoughts:  No  Homicidal Thoughts:  No  Memory:  Immediate;   Fair Recent;   Fair Remote;   Fair  Judgement:  Impaired  Insight:  Shallow  Psychomotor Activity:  Restlessness  Concentration:  Concentration: Fair  Recall:  [employer] of Knowledge:  Fair  Language:  Fair  Akathisia:  No  Handed:  Right  AIMS (if indicated):     Assets:  Desire for Improvement Financial Resources/Insurance Housing Resilience Social Support  ADL's:  Impaired  Cognition:  WNL  Sleep:        Treatment Plan  Summary: Daily contact  with patient to assess and evaluate symptoms and progress in treatment, Medication management and Plan 69 year old man with a constellation of physical and mental symptoms all of which I think are probably directly or indirectly related to anxiety and depression.  Speaking with the patient the overwhelming impression is of someone who is extremely anxious worried and afraid and hyper focused on his body and the potential for disability.  Patient is not psychotic.  There is no evidence of suicidal thinking or behavior or dangerousness.  He does not require inpatient hospitalization.  I tried to offer reassurance that this condition was going to get better ultimately.  Because of his focus on medicine side effects I would not suggest adding anything more at this time because he is already so anxious.  I suggest the patient focus on eating and drinking normally, getting his strength back and trying to get back to a normal routine while following up with his primary care doctor and his outpatient psychiatrist.  I have communicated this with the hospitalist.  If the patient is admitted psychiatry can potentially follow him up.  No orders written at this time.  Spoke with the ER doctor as well.  Disposition: No evidence of imminent risk to self or others at present.   Patient does not meet criteria for psychiatric inpatient admission. Supportive therapy provided about ongoing stressors. Discussed crisis plan, support from social network, calling 911, coming to the Emergency Department, and calling Suicide Hotline.  Alethia Berthold, MD 12/02/2019 11:30 AM

## 2019-12-02 NOTE — Telephone Encounter (Signed)
Can you follow up with wife please.

## 2019-12-02 NOTE — ED Notes (Signed)
Pt eating lunch

## 2019-12-02 NOTE — Telephone Encounter (Signed)
Wife, [name], called with update on Alexander Barton's status.  He has not UTI.  He was dehydrated so they went to the ER for fluids.  They say there is no reason to keep him or admit him for psychiatric reasons, however, they are going to keep him in the ER for today for further observation of behavorial.  They are not going to added or change any medications due his sensitivity to medication.  They will refer him back to [place] for further treatment.  He is very irritable.  Should they schedule a follow up appt soon for evaluation of his current condition?

## 2019-12-02 NOTE — ED Notes (Signed)
PAtient given breakfast tray. Wife fixing tray for patient at this time

## 2019-12-02 NOTE — ED Notes (Signed)
Pt provided self with peri care. Assisted back into bed.

## 2019-12-02 NOTE — ED Notes (Signed)
Pt given water at this time and patient family member given coffee and water. This RN explained the admission process and procedures to pt and wife at this time and answered questions as able. Pt and family member verbalize understanding, no new questions at this time. New needs noted, pt comfortable in bed, will continue to monitor.

## 2019-12-02 NOTE — ED Provider Notes (Signed)
[facility] Emergency Department Provider Note  ____________________________________________   First MD Initiated Contact with Patient 12/02/19 0001     (approximate)  I have reviewed the triage vital signs and the nursing notes.   HISTORY  Chief Complaint Dehydration  HPI Alexander Barton is a 69 y.o. male including thoracic aortic aneurysm COPD GERD hypertension hypercholesterolemia CVA presents to the emergency department secondary to increasing confusion over the past week.  Patient was seen by psychiatrist who referred the patient to urgent care.  Urgent care then subsequently referred the patient to the emergency department with concerns for dehydration.  No fever reported.  No nausea vomiting diarrhea constipation.  Patient's wife states that her husband has had poor p.o. intake stating that he does not want to eat".      Past Medical History:  Diagnosis Date  . Aortic aneurysm, thoracic ([place])   . Aortic valve disease   . Arthritis   . BPH (benign prostatic hyperplasia)   . COPD (chronic obstructive pulmonary disease) ([place])   . GERD (gastroesophageal reflux disease)   . HTN (hypertension)   . Hypercholesterolemia   . Stroke ([place])   . TIA (transient ischemic attack)     Patient Active Problem List   Diagnosis Date Noted  . Anxiety and depression 11/17/2019  . AMS (altered mental status) 09/21/2019  . Gallstone pancreatitis 09/21/2019  . Sleep disturbance 09/21/2019  . Asthma-COPD overlap syndrome ([place]) 08/26/2019  . Environmental exposure 08/26/2019  . Idiopathic hypoparathyroidism ([place]) 06/29/2019  . PVC's (premature ventricular contractions) 06/09/2019  . Pancreatic pseudocyst   . Intractable abdominal pain 05/30/2019  . Nausea and vomiting 05/30/2019  . Hyperglycemia 05/30/2019  . Pancreatic cyst   . Bilateral finger numbness 01/20/2019  . Headache 01/20/2019  . Night sweats 01/20/2019  . Swelling of male genital structure  01/20/2019  . Educated about COVID-19 virus infection 01/05/2019  . Cardiomyopathy ([place]) 01/05/2019  . UTI (urinary tract infection) 12/20/2018  . COVID-19 11/10/2018  . Fall 05/07/2018  . Costochondritis 01/04/2018  . Thyroid nodule 01/04/2018  . Dissecting aneurysm of thoracic aorta, Stanford type B ([place]) 11/08/2017  . Anemia 11/08/2017  . Hyponatremia 11/08/2017  . Overweight 07/01/2017  . History of stroke   . GERD (gastroesophageal reflux disease)   . Arthritis   . Neck pain 12/01/2016  . Knee osteoarthritis 12/01/2016  . Physical deconditioning 06/24/2016  . Dyspnea on exertion 06/23/2016  . Chronic pain of right knee 12/18/2015  . Chronic obstructive pulmonary disease ([place]) 11/14/2014  . BPH (benign prostatic hyperplasia) 11/14/2014  . Preventative health care 11/14/2014  . Long term current use of anticoagulant therapy 10/29/2013  . Type 2 diabetes mellitus without complications ([place]) 33/54/5625  . TIA (transient ischemic attack) 06/03/2012  . H/O aortic valve replacement 12/26/2011  . History of aortic arch replacement 01/07/2011  . HTN (hypertension)   . Hyperlipidemia   . Aortic valve disease     Past Surgical History:  Procedure Laterality Date  . ACHILLES TENDON REPAIR    . AORTIC VALVE REPLACEMENT     #23 On-X valve conduit  . ASCENDING AORTIC ANEURYSM REPAIR    . BACK SURGERY    . FOOT SURGERY      Prior to Admission medications   Medication Sig Start Date End Date Taking? Authorizing Provider  acetaminophen (TYLENOL) 500 MG tablet Take 1,000 mg by mouth every 6 (six) hours as needed for mild pain.    [provider]  albuterol [facility]  HFA) 108 (90 Base) MCG/ACT inhaler Inhale 2 puffs into the lungs every 4 (four) hours as needed for wheezing or shortness of breath.    [provider]  benztropine (COGENTIN) 1 MG tablet Take 1 tablet (1 mg total) by mouth daily as needed for tremors. 09/27/19   Connye Burkitt, NP  blood glucose meter kit  and supplies KIT Dispense based on patient and insurance preference. Use twice daily. (FOR ICD-10 E11.9). 06/08/19   Leone Haven, MD  Budeson-Glycopyrrol-Formoterol (BREZTRI AEROSPHERE) 160-9-4.8 MCG/ACT AERO Inhale 2 puffs into the lungs in the morning and at bedtime. 08/26/19   Lauraine Rinne, NP  budesonide-formoterol (SYMBICORT) 160-4.5 MCG/ACT inhaler Inhale 2 puffs into the lungs 2 (two) times daily.    [provider]  clobetasol ointment (TEMOVATE) 0.16 % Apply 1 application topically daily as needed (to affected areas- for rashes).  05/25/19   [provider]  glucose blood test strip Use twice daily E11.9 07/20/19   Leone Haven, MD  haloperidol (HALDOL) 5 MG tablet Take 1 tablet (5 mg total) by mouth at bedtime. 10/20/19   Leone Haven, MD  Investigational - Study Medication Take 1 tablet by mouth See admin instructions. Study name: [id number] Additional study details: cholesterol [facility] [phone] Take 1 tablet by mouth in the morning    [provider]  melatonin 5 MG TABS Take 1-2 tablets (5-10 mg) by mouth daily at bedtime. 09/27/19   Connye Burkitt, NP  mirtazapine (REMERON) 15 MG tablet Take 1 tablet (15 mg total) by mouth at bedtime. 11/15/19   [place], Berdie Ogren, NP  OVER THE COUNTER MEDICATION Take 1 capsule by mouth See admin instructions. Dotera Digestive Supplement capsule- Take 1 capsule by mouth in the morning    [provider]  polyethylene glycol (MIRALAX / GLYCOLAX) 17 g packet Take 17 g by mouth in the morning. [place]    [provider]  senna (SENOKOT) 8.6 MG TABS tablet Take 2 tablets by mouth in the morning and at bedtime.    [provider]  tamsulosin (FLOMAX) 0.4 MG CAPS capsule TAKE 2 CAPSULES BY MOUTH EVERY DAY 09/26/19   Leone Haven, MD  Tiotropium Bromide Monohydrate (SPIRIVA RESPIMAT) 2.5 MCG/ACT AERS Inhale 1 puff into the lungs in the morning and at bedtime.      [provider]  warfarin (COUMADIN) 2 MG tablet Take 2 mg by mouth See admin instructions. Take 2 mg by mouth in the late evening on Saturday ONLY 09/13/19   [provider]  warfarin (COUMADIN) 5 MG tablet Take 2 tablets daily or as directed. 11/04/19   [name], [name] M, MD  enoxaparin (LOVENOX) 80 MG/0.8ML injection Inject 0.8 mLs (80 mg total) into the skin every 12 (twelve) hours. Patient not taking: Reported on 09/22/2019 07/29/19 09/27/19  [name], [name] M, MD    Allergies Codeine, Gadolinium derivatives, Hydrocodone, Iodinated diagnostic agents, Other, Statins, Hydromorphone, Lipitor [atorvastatin calcium], and Seroquel [quetiapine fumerate]  Family History  Problem Relation Age of Onset  . Alzheimer's disease Father   . Diabetes Father        age onset DM  . Hypertension Sister   . Hypertension Brother   . Diabetes Mother   . Diabetes Sister   . Colon cancer Neg Hx     Social History Social History   Tobacco Use  . Smoking status: Never Smoker  . Smokeless tobacco: Never Used  Substance Use Topics  .  Alcohol use: No    Alcohol/week: 0.0 standard drinks  . Drug use: No    Review of Systems Constitutional: No fever/chills Eyes: No visual changes. ENT: No sore throat. Cardiovascular: Denies chest pain. Respiratory: Denies shortness of breath. Gastrointestinal: No abdominal pain.  No nausea, no vomiting.  No diarrhea.  No constipation. Genitourinary: Negative for dysuria. Musculoskeletal: Negative for neck pain.  Negative for back pain. Integumentary: Negative for rash. Neurological: Negative for headaches, focal weakness or numbness.   ____________________________________________   PHYSICAL EXAM:  VITAL SIGNS: ED Triage Vitals  Enc Vitals Group     BP 12/01/19 1832 128/73     Pulse Rate 12/01/19 1832 94     Resp 12/01/19 1832 18     Temp 12/01/19 1832 98.6 F (37 C)     Temp Source 12/01/19 1832 Oral     SpO2 12/01/19 1832 97 %     Weight  12/01/19 1833 86.2 kg (190 lb)     Height 12/01/19 1833 1.854 m (_0 )     Head Circumference --      Peak Flow --      Pain Score 12/01/19 1833 0     Pain Loc --      Pain Edu? --      Excl. in [place]? --     Constitutional: Alert and oriented.  Eyes: Conjunctivae are normal.  Head: Atraumatic. Mouth/Throat: Patient is wearing a mask. Neck: No stridor.  No meningeal signs.   Cardiovascular: Normal rate, regular rhythm. Good peripheral circulation. Grossly normal heart sounds. Respiratory: Normal respiratory effort.  No retractions. Gastrointestinal: Soft and nontender. No distention.  Musculoskeletal: No lower extremity tenderness nor edema. No gross deformities of extremities. Neurologic:  Normal speech and language. No gross focal neurologic deficits are appreciated.  Skin:  Skin is warm, dry and intact. Psychiatric: Mood and affect are normal. Speech and behavior are normal.  ____________________________________________   LABS (all labs ordered are listed, but only abnormal results are displayed)  Labs Reviewed  URINALYSIS, COMPLETE (UACMP) WITH MICROSCOPIC - Abnormal; Notable for the following components:      Result Value   Color, Urine YELLOW (*)    APPearance HAZY (*)    Glucose, UA 50 (*)    Ketones, ur 5 (*)    Bacteria, UA RARE (*)    All other components within normal limits  BASIC METABOLIC PANEL - Abnormal; Notable for the following components:   Glucose, Bld 170 (*)    All other components within normal limits  CBC - Abnormal; Notable for the following components:   HCT 38.3 (*)    All other components within normal limits  TROPONIN I (HIGH SENSITIVITY) - Abnormal; Notable for the following components:   Troponin I (High Sensitivity) 29 (*)    All other components within normal limits  LIPASE, BLOOD  TROPONIN I (HIGH SENSITIVITY)      RADIOLOGY I, Jansen N Agamjot Kilgallon, personally viewed and evaluated these images (plain radiographs) as part of my medical  decision making, as well as reviewing the written report by the radiologist.  ED MD interpretation:    Official radiology report(s): CT ABDOMEN PELVIS WO CONTRAST  Addendum Date: 12/02/2019   ADDENDUM REPORT: 12/02/2019 03:34 ADDENDUM: The cystic appearing area adjacent to the left liver lobe seen on ultrasound appears to be arising likely from the gastroduodenal junction and could represent a fluid-filled diverticulum. Electronically Signed   By: Prudencio Pair M.D.   On: 12/02/2019 03:34  Result Date: 12/02/2019 CLINICAL DATA:  Abdominal pain EXAM: CT ABDOMEN AND PELVIS WITHOUT CONTRAST TECHNIQUE: Multidetector CT imaging of the abdomen and pelvis was performed following the standard protocol without IV contrast. COMPARISON:  Jun 04, 2019 FINDINGS: Lower chest: The visualized heart size within normal limits. A lower intrathoracic aortic endograft is noted again. No hiatal hernia. The visualized portions of the lungs are clear. Hepatobiliary: Although limited due to the lack of intravenous contrast, normal in appearance without gross focal abnormality. Layering calcified gallstones are present. Pancreas:  Unremarkable.  No surrounding inflammatory changes. Spleen: Normal in size. Although limited due to the lack of intravenous contrast, normal in appearance. Adrenals/Urinary Tract: Both adrenal glands appear normal. A 2 cm low-density lesion seen off the lower pole of the right kidney. No renal or collecting system calculi are noted. No hydronephrosis. Again noted is mild superior bladder wall thickening as on the prior exam. Stomach/Bowel: The stomach, small bowel, and colon are normal in appearance. No inflammatory changes or obstructive findings. Scattered colonic diverticulosis is noted. Vascular/Lymphatic: There are no enlarged abdominal or pelvic lymph nodes. Scattered aortic atherosclerotic calcifications are seen without aneurysmal dilatation. Reproductive: The prostate is unremarkable. Other: No  evidence of abdominal wall mass or hernia. Musculoskeletal: No acute or significant osseous findings. IMPRESSION: No renal or collecting system calculi.  No hydronephrosis. Mild superior bladder wall thickening as on the prior exam, which could be due to chronic cystitis. Diverticulosis without diverticulitis. Cholelithiasis Electronically Signed: By: Prudencio Pair M.D. On: 12/02/2019 03:15   US ABDOMEN LIMITED RUQ (LIVER/GB)  Result Date: 12/02/2019 CLINICAL DATA:  Right upper quadrant pain. EXAM: ULTRASOUND ABDOMEN LIMITED RIGHT UPPER QUADRANT COMPARISON:  May 29, 2019 FINDINGS: Gallbladder: Multiple shadowing echogenic gallstones are seen within the gallbladder lumen. There is no evidence of gallbladder wall thickening (2.9 mm). No sonographic Murphy sign noted by sonographer. Common bile duct: Diameter: 4.6 mm Liver: No focal lesion identified. Diffusely increased echogenicity of the liver parenchyma is noted. Portal vein is patent on color Doppler imaging with normal direction of blood flow towards the liver. Other: A 2.0 cm x 2.6 cm x 2.9 cm anechoic structure is seen adjacent to the left lobe of the liver. No flow is seen within this region on color Doppler evaluation. IMPRESSION: 1. Cholelithiasis, without evidence of acute cholecystitis. 2. Fatty liver. 3. Cystic appearing area seen adjacent to the left lobe of the liver which may represent a recurring pancreatic pseudocyst. Further evaluation with abdomen and pelvis CT is recommended. Electronically Signed   By: Virgina Norfolk M.D.   On: 12/02/2019 02:04    _______________________________________  Procedures   ____________________________________________   INITIAL IMPRESSION / MDM / [place] / ED COURSE  As part of my medical decision making, I reviewed the following data within the electronic MEDICAL RECORD NUMBER 69 year old male presented with above-stated history and physical exam secondary to increasing confusion over the  past week.  Concern for possible psychiatric versus encephalopathy medical source.  Laboratory data unremarkable with the exception of a troponin of 29.  However given patient's level of confusion plan for admission for further evaluation and management.  Patient be admitted to the hospital staff and will require psychiatry consultation as well.  ____________________________________________  FINAL CLINICAL IMPRESSION(S) / ED DIAGNOSES  Altered mental status  MEDICATIONS GIVEN DURING THIS VISIT:  Medications  sodium chloride 0.9 % bolus 1,000 mL (0 mLs Intravenous Stopped 12/02/19 0214)  acetaminophen (TYLENOL) tablet 650 mg ( Oral Not Given 12/02/19  [place])     ED Discharge Orders    None      *Please note:  Alexander Barton was evaluated in Emergency Department on 12/02/2019 for the symptoms described in the history of present illness. He was evaluated in the context of the global COVID-19 pandemic, which necessitated consideration that the patient might be at risk for infection with the SARS-CoV-2 virus that causes COVID-19. Institutional protocols and algorithms that pertain to the evaluation of patients at risk for COVID-19 are in a state of rapid change based on information released by regulatory bodies including the CDC and federal and state organizations. These policies and algorithms were followed during the patient's care in the ED.  Some ED evaluations and interventions may be delayed as a result of limited staffing during and after the pandemic.*  Note:  This document was prepared using Dragon voice recognition software and may include unintentional dictation errors.   Gregor Hams, MD 12/05/19 0001

## 2019-12-02 NOTE — ED Notes (Signed)
Pt assisted up to bedside toilet as requested. Call bell within reach.

## 2019-12-02 NOTE — ED Notes (Signed)
Visitor remains at bedside with pt.

## 2019-12-02 NOTE — ED Notes (Signed)
Pt states that things 'haven't been right' and that his psychiatrist suggested he go to his PCP to have his urine tested due to increased urine output. Pt has had decreased intake. Pt in NAD, no pain noted, no other symptoms or complaints. Pt is slightly diaphoretic.

## 2019-12-02 NOTE — Evaluation (Addendum)
Physical Therapy Evaluation Patient Details Name: Alexander Barton MRN: [id number] DOB: [birth year] Today's Date: 12/02/2019   History of Present Illness  Pt is a 69 y/o M admitted on 12/01/19 with c/c of poor oral intake, trouble sleeping & decreased interest in most ADLs. PMH: s/p aortic valve repair, s/p aortic aneurysm repair, COPD, BPH, GERD, hx of TIA, depression, HTN, hypercholesterolemia, stroke  Clinical Impression  Pt pleasant & agreeable to tx. Pt currently requires min assist for gait with/without AD, with pt preferring to use RW as he feels he needs UE support. Pt performs the following BLE strengthening exercises with instructional cuing from PT: standing marches & heel raises (BUE support on RW), hip adduction pillow squeezes, and 10x sit<>stand. SpO2 >90% on room air throughout session. Pt would benefit from ongoing acute PT services to address balance & progress gait with LRAD.    Follow Up Recommendations Home health PT;Supervision for mobility/OOB    Equipment Recommendations  Rolling walker with 5" wheels    Recommendations for Other Services       Precautions / Restrictions Precautions Precautions: None Restrictions Weight Bearing Restrictions: No      Mobility  Bed Mobility Overal bed mobility: Needs Assistance Bed Mobility: Supine to Sit;Sit to Supine     Supine to sit: Modified independent (Device/Increase time);HOB elevated Sit to supine: Modified independent (Device/Increase time);HOB elevated   General bed mobility comments: extra time    Transfers Overall transfer level: Modified independent   Transfers: Sit to/from Stand Sit to Stand: Modified independent (Device/Increase time)         General transfer comment: can transfer sit<>stand without AD  Ambulation/Gait Ambulation/Gait assistance: Min assist Gait Distance (Feet): 15 Feet (side steps at EOB) Assistive device: Rolling walker (2 wheeled)       General Gait Details: pt only  able to take side steps at EOB 2/2 IV, attempted ~5 ft without AD & pt tries to hold to RW reporting he feels he needs UE support so utilized RW for remainder of steps  Stairs            Wheelchair Mobility    Modified Rankin (Stroke Patients Only)       Balance Overall balance assessment: Needs assistance;Mild deficits observed, not formally tested Sitting-balance support: Feet supported Sitting balance-Leahy Scale: Normal     Standing balance support: No upper extremity supported Standing balance-Leahy Scale: Fair Standing balance comment: can stand without assist, min assist for gait without AD                             Pertinent Vitals/Pain Pain Assessment: Faces Faces Pain Scale: Hurts a little bit Pain Location: pt notes a sore bottom 2/2 sitting in chair for so long Pain Intervention(s): Monitored during session;Repositioned    Home Living Family/patient expects to be discharged to:: Private residence Living Arrangements: Spouse/significant other Available Help at Discharge: Family Type of Home: House Home Access: Stairs to enter Entrance Stairs-Rails: Right;Left;Can reach both Technical brewer of Steps: 4-5 Home Layout: One level        Prior Function           Comments: doing well & driving until heart surgery last year, independent at home PTA without AD     Hand Dominance        Extremity/Trunk Assessment   Upper Extremity Assessment Upper Extremity Assessment: Overall WFL for tasks assessed    Lower Extremity Assessment Lower Extremity  Assessment: Overall WFL for tasks assessed       Communication   Communication: No difficulties  Cognition Arousal/Alertness: Awake/alert Behavior During Therapy: WFL for tasks assessed/performed Overall Cognitive Status: Within Functional Limits for tasks assessed                                 General Comments: pt appears anxious, tangential conversation       General Comments      Exercises     Assessment/Plan    PT Assessment Patient needs continued PT services  PT Problem List Decreased mobility;Decreased safety awareness;Decreased activity tolerance;Decreased balance       PT Treatment Interventions DME instruction;Therapeutic exercise;Balance training;Gait training;Stair training;Neuromuscular re-education;Functional mobility training;Therapeutic activities;Patient/family education    PT Goals (Current goals can be found in the Care Plan section)  Acute Rehab PT Goals Patient Stated Goal: to get better PT Goal Formulation: With patient Time For Goal Achievement: 12/16/19 Potential to Achieve Goals: Good    Frequency Min 2X/week   Barriers to discharge        Co-evaluation               AM-PAC PT "6 Clicks" Mobility  Outcome Measure Help needed turning from your back to your side while in a flat bed without using bedrails?: None Help needed moving from lying on your back to sitting on the side of a flat bed without using bedrails?: None Help needed moving to and from a bed to a chair (including a wheelchair)?: A Little Help needed standing up from a chair using your arms (e.g., wheelchair or bedside chair)?: None Help needed to walk in hospital room?: A Little Help needed climbing 3-5 steps with a railing? : A Little 6 Click Score: 21    End of Session Equipment Utilized During Treatment: Gait belt Activity Tolerance: Patient tolerated treatment well Patient left: in bed;with call bell/phone within reach Nurse Communication: Mobility status PT Visit Diagnosis: Unsteadiness on feet (R26.81);Difficulty in walking, not elsewhere classified (R26.2)    Time: 8299-3716 PT Time Calculation (min) (ACUTE ONLY): 19 min   Charges:   PT Evaluation $PT Eval Low Complexity: 1 Low PT Treatments $Therapeutic Exercise: 8-22 mins        Lavone Nian, PT, DPT 12/02/19, 4:14 PM   Waunita Schooner 12/02/2019, 4:12  PM

## 2019-12-02 NOTE — ED Notes (Signed)
Pt assisted back and forth between stretcher and bedside toilet. Pt steady. Pt urinated. Pt provided self with peri care. Visitor remains at bedside. Bed locked low. Rail up. Call bell within reach.

## 2019-12-02 NOTE — H&P (Addendum)
History and Physical    Kenneth Cuaresma Travaglini SWH:[id number] DOB: 1950/03/20 DOA: 12/01/2019  PCP: Leone Haven, MD   Patient coming from: Home  I have personally briefly reviewed patient's old medical records in [place]  Chief Complaint: Poor oral intake  Most of the history was obtained from patient's wife was at the bedside  HPI: OVID WITMAN is a 69 y.o. male with medical history significant for status post aortic valve repair, status post aortic aneurysm repair, COPD, BPH, GERD, history of TIA, history of depression who was brought into the emergency room by his wife for evaluation. Patient's wife states that he usually has a "bad" reaction to anesthesia and had multiple procedures done over the summer for issues involving his pancreas.  Patient received anesthesia for this procedure was in July and his wife states that he usually has trouble with sleep and confusion after receiving anesthesia. Patient was referred to neurology by his primary care provider in August, 2021 and was seen at [facility] in [place] for evaluation of delirium and mental status changes.  Per patient's wife he was in the ER for 5 days and they were unable to find a bed in the psychiatric unit and so patient was discharged home on medication.  He is currently on Haldol which has been decreased from 5 mg to 25 mg daily. His wife states that he has lost interest in most of the things that he enjoys doing and does not want to call his 71 year old mother which he did daily in the past, not interested in attending his grandson's birthday or even having guests in the house.  He does not have difficulty initiating sleep but is unable to stay asleep for long periods of time.  His oral intake is poor and she is concerned he is not drinking as much water like he supposed to do and because of that he is not urinating often. Patient denies having any abdominal pain, no nausea, no vomiting, no  changes in his bowel habits, no dizziness, no lightheadedness, no headache, no chest pain, no fever, no chills or any urinary symptoms. Patient denies having any suicidal or homicidal ideations. They were referred to their primary care provider by the psychiatry nurse practitioner who they had seen to rule out a urinary tract infection.  Patient was sent to the urgent care because his primary care provider was unavailable and eventually ended up in the emergency room. Labs show sodium 139, potassium 3.6, chloride 104, bicarb 23, BUN 19, creatinine 1.1, calcium 9.2, lipase 26, troponin 12, white count 5.6, hemoglobin 13.3, hematocrit 38.3, MCV 89.5, RDW 13.8, platelet count 181, INR 2.4 Respiratory viral panel is negative Urinalysis is sterile CT scan of abdomen and pelvis shows no renal or collecting system calculi.  No hydronephrosis.  Diverticulosis without diverticulitis.  Cholelithiasis   The cystic appearing area adjacent to the left liver lobe seen on ultrasound appears to be arising likely from the gastroduodenal junction and could represent a fluid-filled diverticulum. Gallbladder ultrasound shows cholelithiasis without evidence of acute cholecystitis.  Fatty liver.Cystic appearing area seen adjacent to the left lobe of the liver which may represent a recurring pancreatic pseudocyst. Further evaluation with abdomen and pelvis CT is recommended.    ED Course: Patient seen at the emergency room for evaluation of poor oral intake, trouble sleeping and decreased interest in most activities of daily living.  Patient denies having any suicidal homicidal ideation    Review of Systems:  As per HPI otherwise 10 point review of systems negative.    Past Medical History:  Diagnosis Date  . Aortic aneurysm, thoracic ([place])   . Aortic valve disease   . Arthritis   . BPH (benign prostatic hyperplasia)   . COPD (chronic obstructive pulmonary disease) ([place])   . GERD (gastroesophageal reflux  disease)   . HTN (hypertension)   . Hypercholesterolemia   . Stroke ([place])   . TIA (transient ischemic attack)     Past Surgical History:  Procedure Laterality Date  . ACHILLES TENDON REPAIR    . AORTIC VALVE REPLACEMENT     #23 On-X valve conduit  . ASCENDING AORTIC ANEURYSM REPAIR    . BACK SURGERY    . FOOT SURGERY       reports that he has never smoked. He has never used smokeless tobacco. He reports that he does not drink alcohol and does not use drugs.  Allergies  Allergen Reactions  . Codeine Anaphylaxis  . Gadolinium Derivatives Other (See Comments)    Intense sneezing from MRI contrast  . Hydrocodone Anaphylaxis  . Iodinated Diagnostic Agents Shortness Of Breath and Other (See Comments)    Uncontrollable sneezing and "shortness of breath after prep"- needs sedative the night before and hour and benadryl before using  . Other Other (See Comments)    Unnamed anesthesia- Patient is in the E.D. possibly due to the effects of it: Delirium, confusion, and sleep deprivation  . Statins Other (See Comments)    Muscle pain  . Antihistamines, Diphenhydramine-Type Other (See Comments)    Unable to urinate. All antihistamines.   . Hydromorphone Other (See Comments)    MUSCLE PAIN  . Lipitor [Atorvastatin Calcium] Other (See Comments)    Muscle pain and a "knot" appears on his right side, near the ribcage  . Seroquel [Quetiapine Fumerate] Swelling and Other (See Comments)    "bad trip" and tongue became swollen    Family History  Problem Relation Age of Onset  . Alzheimer's disease Father   . Diabetes Father        age onset DM  . Hypertension Sister   . Hypertension Brother   . Diabetes Mother   . Diabetes Sister   . Colon cancer Neg Hx      Prior to Admission medications   Medication Sig Start Date End Date Taking? Authorizing Provider  acetaminophen (TYLENOL) 500 MG tablet Take 1,000 mg by mouth every 6 (six) hours as needed for mild pain.   Yes [provider]  albuterol (PROAIR HFA) 108 (90 Base) MCG/ACT inhaler Inhale 2 puffs into the lungs every 4 (four) hours as needed for wheezing or shortness of breath.   Yes [provider]  blood glucose meter kit and supplies KIT Dispense based on patient and insurance preference. Use twice daily. (FOR ICD-10 E11.9). 06/08/19  Yes Leone Haven, MD  Budeson-Glycopyrrol-Formoterol (BREZTRI AEROSPHERE) 160-9-4.8 MCG/ACT AERO Inhale 2 puffs into the lungs in the morning and at bedtime. 08/26/19  Yes Lauraine Rinne, NP  glucose blood test strip Use twice daily E11.9 07/20/19  Yes Leone Haven, MD  Investigational - Study Medication Take 1 tablet by mouth See admin instructions. Study name: [id number] Additional study details: cholesterol [facility] [phone] Take 1 tablet by mouth in the morning   Yes [provider]  OVER THE COUNTER MEDICATION Take 1 capsule by mouth See admin instructions. Dotera Digestive Supplement capsule- Take 1 capsule by mouth in the morning  Yes [provider]  polyethylene glycol (MIRALAX / GLYCOLAX) 17 g packet Take 17 g by mouth in the morning. [place]   Yes [provider]  senna (SENOKOT) 8.6 MG TABS tablet Take 2 tablets by mouth in the morning and at bedtime.   Yes [provider]  tamsulosin (FLOMAX) 0.4 MG CAPS capsule TAKE 2 CAPSULES BY MOUTH EVERY DAY 09/26/19  Yes Leone Haven, MD  warfarin (COUMADIN) 2 MG tablet Take 2 mg by mouth See admin instructions. Take 2 mg by mouth in the late evening on Saturday ONLY 09/13/19  Yes [provider]  warfarin (COUMADIN) 5 MG tablet Take 2 tablets daily or as directed. 11/04/19  Yes [name], [name] M, MD  haloperidol (HALDOL) 5 MG tablet Take 1 tablet (5 mg total) by mouth at bedtime. Patient taking differently: Take 2.5 mg by mouth at bedtime.  10/20/19   Leone Haven, MD  enoxaparin (LOVENOX) 80 MG/0.8ML injection Inject 0.8 mLs (80 mg total)  into the skin every 12 (twelve) hours. Patient not taking: Reported on 09/22/2019 07/29/19 09/27/19  [name], [name] M, MD    Physical Exam: Vitals:   12/02/19 0600 12/02/19 0700 12/02/19 0900 12/02/19 1000  BP: 130/69 (!) 153/80 (!) 154/71 (!) 156/81  Pulse: 73 80 81 87  Resp: 14   16  Temp:      TempSrc:      SpO2: 96% 97% 97% 96%  Weight:      Height:         Vitals:   12/02/19 0600 12/02/19 0700 12/02/19 0900 12/02/19 1000  BP: 130/69 (!) 153/80 (!) 154/71 (!) 156/81  Pulse: 73 80 81 87  Resp: 14   16  Temp:      TempSrc:      SpO2: 96% 97% 97% 96%  Weight:      Height:        Constitutional: NAD, alert and oriented x 3 Eyes: PERRL, lids and conjunctivae normal ENMT: Mucous membranes are moist.  Neck: normal, supple, no masses, no thyromegaly Respiratory: clear to auscultation bilaterally, no wheezing, no crackles. Normal respiratory effort. No accessory muscle use.  Cardiovascular: Regular rate and rhythm, no murmurs / rubs / gallops. No extremity edema. 2+ pedal pulses. No carotid bruits.  Abdomen: no tenderness, no masses palpated. No hepatosplenomegaly. Bowel sounds positive.  Musculoskeletal: no clubbing / cyanosis. No joint deformity upper and lower extremities.  Skin: no rashes, lesions, ulcers.  Neurologic: No gross focal neurologic deficit. Psychiatric: Flat affect.   Labs on Admission: I have personally reviewed following labs and imaging studies  CBC: Recent Labs  Lab 12/01/19 1836  WBC 5.6  HGB 13.3  HCT 38.3*  MCV 89.5  PLT 355   Basic Metabolic Panel: Recent Labs  Lab 12/01/19 1836  NA 139  K 3.6  CL 104  CO2 23  GLUCOSE 170*  BUN 19  CREATININE 1.11  CALCIUM 9.2   GFR: Estimated Creatinine Clearance: 69 mL/min (by C-G formula based on SCr of 1.11 mg/dL). Liver Function Tests: No results for input(s): AST, ALT, ALKPHOS, BILITOT, PROT, ALBUMIN in the last 168 hours. Recent Labs  Lab 12/01/19 1836  LIPASE 26   No results for  input(s): AMMONIA in the last 168 hours. Coagulation Profile: No results for input(s): INR, PROTIME in the last 168 hours. Cardiac Enzymes: No results for input(s): CKTOTAL, CKMB, CKMBINDEX, TROPONINI in the last 168 hours. BNP (last 3 results) No results for input(s): PROBNP  in the last 8760 hours. HbA1C: No results for input(s): HGBA1C in the last 72 hours. CBG: No results for input(s): GLUCAP in the last 168 hours. Lipid Profile: No results for input(s): CHOL, HDL, LDLCALC, TRIG, CHOLHDL, LDLDIRECT in the last 72 hours. Thyroid Function Tests: No results for input(s): TSH, T4TOTAL, FREET4, T3FREE, THYROIDAB in the last 72 hours. Anemia Panel: No results for input(s): VITAMINB12, FOLATE, FERRITIN, TIBC, IRON, RETICCTPCT in the last 72 hours. Urine analysis:    Component Value Date/Time   COLORURINE YELLOW (A) 12/02/2019 0252   APPEARANCEUR HAZY (A) 12/02/2019 0252   LABSPEC 1.030 12/02/2019 0252   PHURINE 5.0 12/02/2019 0252   GLUCOSEU 50 (A) 12/02/2019 0252   HGBUR NEGATIVE 12/02/2019 0252   BILIRUBINUR NEGATIVE 12/02/2019 0252   BILIRUBINUR small (A) 12/01/2019 1627   BILIRUBINUR moderate 01/11/2019 1302   KETONESUR 5 (A) 12/02/2019 0252   PROTEINUR NEGATIVE 12/02/2019 0252   UROBILINOGEN 0.2 12/01/2019 1627   UROBILINOGEN 0.2 06/04/2012 0027   NITRITE NEGATIVE 12/02/2019 0252   LEUKOCYTESUR NEGATIVE 12/02/2019 0252    Radiological Exams on Admission: CT ABDOMEN PELVIS WO CONTRAST  Addendum Date: 12/02/2019   ADDENDUM REPORT: 12/02/2019 03:34 ADDENDUM: The cystic appearing area adjacent to the left liver lobe seen on ultrasound appears to be arising likely from the gastroduodenal junction and could represent a fluid-filled diverticulum. Electronically Signed   By: Prudencio Pair M.D.   On: 12/02/2019 03:34   Result Date: 12/02/2019 CLINICAL DATA:  Abdominal pain EXAM: CT ABDOMEN AND PELVIS WITHOUT CONTRAST TECHNIQUE: Multidetector CT imaging of the abdomen and pelvis was  performed following the standard protocol without IV contrast. COMPARISON:  Jun 04, 2019 FINDINGS: Lower chest: The visualized heart size within normal limits. A lower intrathoracic aortic endograft is noted again. No hiatal hernia. The visualized portions of the lungs are clear. Hepatobiliary: Although limited due to the lack of intravenous contrast, normal in appearance without gross focal abnormality. Layering calcified gallstones are present. Pancreas:  Unremarkable.  No surrounding inflammatory changes. Spleen: Normal in size. Although limited due to the lack of intravenous contrast, normal in appearance. Adrenals/Urinary Tract: Both adrenal glands appear normal. A 2 cm low-density lesion seen off the lower pole of the right kidney. No renal or collecting system calculi are noted. No hydronephrosis. Again noted is mild superior bladder wall thickening as on the prior exam. Stomach/Bowel: The stomach, small bowel, and colon are normal in appearance. No inflammatory changes or obstructive findings. Scattered colonic diverticulosis is noted. Vascular/Lymphatic: There are no enlarged abdominal or pelvic lymph nodes. Scattered aortic atherosclerotic calcifications are seen without aneurysmal dilatation. Reproductive: The prostate is unremarkable. Other: No evidence of abdominal wall mass or hernia. Musculoskeletal: No acute or significant osseous findings. IMPRESSION: No renal or collecting system calculi.  No hydronephrosis. Mild superior bladder wall thickening as on the prior exam, which could be due to chronic cystitis. Diverticulosis without diverticulitis. Cholelithiasis Electronically Signed: By: Prudencio Pair M.D. On: 12/02/2019 03:15   US ABDOMEN LIMITED RUQ (LIVER/GB)  Result Date: 12/02/2019 CLINICAL DATA:  Right upper quadrant pain. EXAM: ULTRASOUND ABDOMEN LIMITED RIGHT UPPER QUADRANT COMPARISON:  May 29, 2019 FINDINGS: Gallbladder: Multiple shadowing echogenic gallstones are seen within the  gallbladder lumen. There is no evidence of gallbladder wall thickening (2.9 mm). No sonographic Murphy sign noted by sonographer. Common bile duct: Diameter: 4.6 mm Liver: No focal lesion identified. Diffusely increased echogenicity of the liver parenchyma is noted. Portal vein is patent on color Doppler imaging with normal direction  of blood flow towards the liver. Other: A 2.0 cm x 2.6 cm x 2.9 cm anechoic structure is seen adjacent to the left lobe of the liver. No flow is seen within this region on color Doppler evaluation. IMPRESSION: 1. Cholelithiasis, without evidence of acute cholecystitis. 2. Fatty liver. 3. Cystic appearing area seen adjacent to the left lobe of the liver which may represent a recurring pancreatic pseudocyst. Further evaluation with abdomen and pelvis CT is recommended. Electronically Signed   By: Virgina Norfolk M.D.   On: 12/02/2019 02:04    EKG: Independently reviewed.   Assessment/Plan Principal Problem:   Decreased oral intake Active Problems:   Type 2 diabetes mellitus without complications (HCC)   Chronic obstructive pulmonary disease (HCC)   Physical deconditioning   Moderate recurrent major depression (HCC)   Adjustment disorder with anxiety     Physical deconditioning Patient is very weak and recently started using a rolling walker due to increased risk for falls We will request PT evaluation   History of major depression and anxiety Patient presents for evaluation of poor oral intake, sleep difficulty and loss of interest in activities that he previously enjoyed He denies having any suicidal homicidal ideations We will request psychiatry evaluation Continue scheduled Haldol    Type 2 diabetes mellitus Diet controlled Maintain consistent carbohydrate diet   History of COPD Continue as needed bronchodilator therapy Continue inhaled steroids   BPH  Continue Flomax   Status post aortic valve replacement On chronic anticoagulation  therapy with Coumadin INR is therapeutic   DVT prophylaxis: Patient is on Coumadin with therapeutic INR Code Status: Full code Family Communication: Greater than 50% of time was spent discussing plan of care with patient and his wife at the bedside.  All questions and concerns have been addressed.  They verbalized understanding and agree with the plan. Disposition Plan: Back to previous home environment Consults called: Psychiatry/physical therapy    TRUE Garciamartinez MD [facility]     12/02/2019, 11:42 AM

## 2019-12-02 NOTE — ED Notes (Signed)
US at bedside at this time 

## 2019-12-03 DIAGNOSIS — R638 Other symptoms and signs concerning food and fluid intake: Secondary | ICD-10-CM | POA: Diagnosis not present

## 2019-12-03 LAB — BASIC METABOLIC PANEL
Anion gap: 7 (ref 5–15)
BUN: 14 mg/dL (ref 8–23)
CO2: 24 mmol/L (ref 22–32)
Calcium: 8.9 mg/dL (ref 8.9–10.3)
Chloride: 106 mmol/L (ref 98–111)
Creatinine, Ser: 1.03 mg/dL (ref 0.61–1.24)
GFR, Estimated: >60 mL/min (ref >60)
Glucose, Bld: 138 mg/dL — ABNORMAL HIGH (ref 70–99)
Potassium: 3.6 mmol/L (ref 3.5–5.1)
Sodium: 137 mmol/L (ref 135–145)

## 2019-12-03 LAB — PROTIME-INR
INR: 1.9 — ABNORMAL HIGH (ref 0.8–1.2)
Prothrombin Time: 21.5 seconds — ABNORMAL HIGH (ref 11.4–15.2)

## 2019-12-03 MED ORDER — HALOPERIDOL 5 MG PO TABS
2.5000 mg | ORAL_TABLET | Freq: Every day | ORAL | Status: DC
Start: 2019-12-03 — End: 2020-01-10

## 2019-12-03 NOTE — TOC Transition Note (Signed)
Transition of Care [facility]) - CM/SW Discharge Note   Patient Details  Name: Alexander Barton MRN: [id number] Date of Birth: 1950/06/05  Transition of Care [facility]) CM/SW Contact:  [name], LCSW Phone Number: 12/03/2019, 10:48 AM   Clinical Narrative:    Pt medically stable for discharge per MD. Pt will be transported home by his spouse. CSW was made aware that pt needed [facility] and DME. CSW arranged HH PT with [facility]. CSW contacted [name] and arranged RW and 3n1 to be delivered to pt's room through [facility].    Final next level of care: [place] Barriers to Discharge: No Barriers Identified   Patient Goals and CMS Choice        Discharge Placement                  Name of family member notified: [name] Patient and family notified of of transfer: 12/03/19  Discharge Plan and Services                DME Arranged: 3-N-1, Walker rolling DME Agency: Other - Comment ([place]) Date DME Agency Contacted: 12/03/19 Time DME Agency Contacted: [id number] Representative spoke with at DME Agency: Melene Muller HH Arranged: PT [place]: [place] Date [place]: 12/03/19 Time [place]: [id number] Representative spoke with at [place]: [place] ([employer]) Interventions     Readmission Risk Interventions No flowsheet data found.

## 2019-12-03 NOTE — Discharge Summary (Signed)
Physician Discharge Summary   Alexander Barton  male DOB: Apr 28, 1950  DDU:[id number]  PCP: Leone Haven, MD  Admit date: 12/01/2019 Discharge date: 12/03/2019  Admitted From: home Disposition:  home Home Health: Yes CODE STATUS: Full code  Discharge Instructions    Discharge instructions   Complete by: As directed    Inpatient psychiatrist had seen you, and recommended no change in your medication, and cleared you to go home and continue following up with your outpatient mental health provider.  Recommend better nutrition and hydration. [facility] Course:  For full details, please see H&P, progress notes, consult notes and ancillary notes.  Briefly,  Alexander Barton is a 69 y.o. male with medical history significant for status post aortic valve repair, status post aortic aneurysm repair, COPD, BPH, GERD, history of TIA, history of depression who was brought into the emergency room by his wife for evaluation for depression, insomnia, and poor oral intake.  History of major depression and anxiety He denied having any suicidal homicidal ideations.  Wife reported pt was tried on Zoloft but had severe dizziness from it, and was prescribed Haldol instead.  Pt received inpatient psych consult, how recommended no change in psych medication due to pt's severe anxiety about medication side effects.  Psych cleared pt to go home to continue followup with outpatient mental health provider.    Physical deconditioning Patient is very weak and recently started using a rolling walker due to increased risk for falls.  PT eval rec HHPT.  Pt recommended better nutrition and hydration.  Type 2 diabetes mellitus Diet controlled  History of COPD, stable Continued as needed bronchodilator therapy and inhaled steroids  BPH  Continued Flomax  Status post aortic valve replacement on chronic anticoagulation therapy with Coumadin INR was therapeutic   Discharge  Diagnoses:  Principal Problem:   Decreased oral intake Active Problems:   Type 2 diabetes mellitus without complications (HCC)   Chronic obstructive pulmonary disease (HCC)   Physical deconditioning   Moderate recurrent major depression (HCC)   Adjustment disorder with anxiety    Discharge Instructions:  Allergies as of 12/03/2019      Reactions   Codeine Anaphylaxis   Gadolinium Derivatives Other (See Comments)   Intense sneezing from MRI contrast   Hydrocodone Anaphylaxis   Iodinated Diagnostic Agents Shortness Of Breath, Other (See Comments)   Uncontrollable sneezing and "shortness of breath after prep"- needs sedative the night before and hour and benadryl before using   Other Other (See Comments)   Unnamed anesthesia- Patient is in the E.D. possibly due to the effects of it: Delirium, confusion, and sleep deprivation   Statins Other (See Comments)   Muscle pain   Antihistamines, Diphenhydramine-type Other (See Comments)   Unable to urinate. All antihistamines.    Hydromorphone Other (See Comments)   MUSCLE PAIN   Lipitor [atorvastatin Calcium] Other (See Comments)   Muscle pain and a "knot" appears on his right side, near the ribcage   Seroquel [quetiapine Fumerate] Swelling, Other (See Comments)   "bad trip" and tongue became swollen      Medication List    TAKE these medications   acetaminophen 500 MG tablet Commonly known as: TYLENOL Take 1,000 mg by mouth every 6 (six) hours as needed for mild pain.   blood glucose meter kit and supplies Kit Dispense based on patient and insurance preference. Use twice daily. (FOR ICD-10 E11.9).   Breztri Aerosphere 160-9-4.8 MCG/ACT  Aero Generic drug: Budeson-Glycopyrrol-Formoterol Inhale 2 puffs into the lungs in the morning and at bedtime.   glucose blood test strip Use twice daily E11.9   haloperidol 5 MG tablet Commonly known as: HALDOL Take 0.5 tablets (2.5 mg total) by mouth at bedtime.   Investigational -  Study Medication Take 1 tablet by mouth See admin instructions. Study name: [id number] Additional study details: cholesterol [facility] [phone]- Take 1 tablet by mouth in the morning   OVER THE COUNTER MEDICATION Take 1 capsule by mouth See admin instructions. Dotera Digestive Supplement capsule- Take 1 capsule by mouth in the morning   polyethylene glycol 17 g packet Commonly known as: MIRALAX / GLYCOLAX Take 17 g by mouth in the morning. MIX AND DRINK   ProAir HFA 108 (90 Base) MCG/ACT inhaler Generic drug: albuterol Inhale 2 puffs into the lungs every 4 (four) hours as needed for wheezing or shortness of breath.   senna 8.6 MG Tabs tablet Commonly known as: SENOKOT Take 2 tablets by mouth in the morning and at bedtime.   tamsulosin 0.4 MG Caps capsule Commonly known as: FLOMAX TAKE 2 CAPSULES BY MOUTH EVERY DAY   warfarin 2 MG tablet Commonly known as: COUMADIN Take as directed. If you are unsure how to take this medication, talk to your nurse or doctor. Original instructions: Take 2 mg by mouth See admin instructions. Take 2 mg by mouth in the late evening on Saturday ONLY   warfarin 5 MG tablet Commonly known as: COUMADIN Take as directed. If you are unsure how to take this medication, talk to your nurse or doctor. Original instructions: Take 2 tablets daily or as directed.        Follow-up Information    Leone Haven, MD. Schedule an appointment as soon as possible for a visit in 1 week(s).   Specialty: Family Medicine Contact information: [address] [place] [phone]        [name], [name] M, MD .   Specialty: Cardiology Contact information: [address] [place] [address] [phone]        Your outpatient mental health provider Follow up in 1 week(s).               Allergies  Allergen Reactions  . Codeine Anaphylaxis  . Gadolinium Derivatives Other (See Comments)    Intense  sneezing from MRI contrast  . Hydrocodone Anaphylaxis  . Iodinated Diagnostic Agents Shortness Of Breath and Other (See Comments)    Uncontrollable sneezing and "shortness of breath after prep"- needs sedative the night before and hour and benadryl before using  . Other Other (See Comments)    Unnamed anesthesia- Patient is in the E.D. possibly due to the effects of it: Delirium, confusion, and sleep deprivation  . Statins Other (See Comments)    Muscle pain  . Antihistamines, Diphenhydramine-Type Other (See Comments)    Unable to urinate. All antihistamines.   . Hydromorphone Other (See Comments)    MUSCLE PAIN  . Lipitor [Atorvastatin Calcium] Other (See Comments)    Muscle pain and a "knot" appears on his right side, near the ribcage  . Seroquel [Quetiapine Fumerate] Swelling and Other (See Comments)    "bad trip" and tongue became swollen     The results of significant diagnostics from this hospitalization (including imaging, microbiology, ancillary and laboratory) are listed below for reference.   Consultations:   Procedures/Studies: CT ABDOMEN PELVIS WO CONTRAST  Addendum Date: 12/02/2019  ADDENDUM REPORT: 12/02/2019 03:34 ADDENDUM: The cystic appearing area adjacent to the left liver lobe seen on ultrasound appears to be arising likely from the gastroduodenal junction and could represent a fluid-filled diverticulum. Electronically Signed   By: Prudencio Pair M.D.   On: 12/02/2019 03:34   Result Date: 12/02/2019 CLINICAL DATA:  Abdominal pain EXAM: CT ABDOMEN AND PELVIS WITHOUT CONTRAST TECHNIQUE: Multidetector CT imaging of the abdomen and pelvis was performed following the standard protocol without IV contrast. COMPARISON:  Jun 04, 2019 FINDINGS: Lower chest: The visualized heart size within normal limits. A lower intrathoracic aortic endograft is noted again. No hiatal hernia. The visualized portions of the lungs are clear. Hepatobiliary: Although limited due to the lack of  intravenous contrast, normal in appearance without gross focal abnormality. Layering calcified gallstones are present. Pancreas:  Unremarkable.  No surrounding inflammatory changes. Spleen: Normal in size. Although limited due to the lack of intravenous contrast, normal in appearance. Adrenals/Urinary Tract: Both adrenal glands appear normal. A 2 cm low-density lesion seen off the lower pole of the right kidney. No renal or collecting system calculi are noted. No hydronephrosis. Again noted is mild superior bladder wall thickening as on the prior exam. Stomach/Bowel: The stomach, small bowel, and colon are normal in appearance. No inflammatory changes or obstructive findings. Scattered colonic diverticulosis is noted. Vascular/Lymphatic: There are no enlarged abdominal or pelvic lymph nodes. Scattered aortic atherosclerotic calcifications are seen without aneurysmal dilatation. Reproductive: The prostate is unremarkable. Other: No evidence of abdominal wall mass or hernia. Musculoskeletal: No acute or significant osseous findings. IMPRESSION: No renal or collecting system calculi.  No hydronephrosis. Mild superior bladder wall thickening as on the prior exam, which could be due to chronic cystitis. Diverticulosis without diverticulitis. Cholelithiasis Electronically Signed: By: Prudencio Pair M.D. On: 12/02/2019 03:15   US ABDOMEN LIMITED RUQ (LIVER/GB)  Result Date: 12/02/2019 CLINICAL DATA:  Right upper quadrant pain. EXAM: ULTRASOUND ABDOMEN LIMITED RIGHT UPPER QUADRANT COMPARISON:  May 29, 2019 FINDINGS: Gallbladder: Multiple shadowing echogenic gallstones are seen within the gallbladder lumen. There is no evidence of gallbladder wall thickening (2.9 mm). No sonographic Murphy sign noted by sonographer. Common bile duct: Diameter: 4.6 mm Liver: No focal lesion identified. Diffusely increased echogenicity of the liver parenchyma is noted. Portal vein is patent on color Doppler imaging with normal direction  of blood flow towards the liver. Other: A 2.0 cm x 2.6 cm x 2.9 cm anechoic structure is seen adjacent to the left lobe of the liver. No flow is seen within this region on color Doppler evaluation. IMPRESSION: 1. Cholelithiasis, without evidence of acute cholecystitis. 2. Fatty liver. 3. Cystic appearing area seen adjacent to the left lobe of the liver which may represent a recurring pancreatic pseudocyst. Further evaluation with abdomen and pelvis CT is recommended. Electronically Signed   By: Virgina Norfolk M.D.   On: 12/02/2019 02:04      Labs: BNP (last 3 results) No results for input(s): BNP in the last 8760 hours. Basic Metabolic Panel: Recent Labs  Lab 12/01/19 1836 12/03/19 0714  NA 139 137  K 3.6 3.6  CL 104 106  CO2 23 24  GLUCOSE 170* 138*  BUN 19 14  CREATININE 1.11 1.03  CALCIUM 9.2 8.9   Liver Function Tests: No results for input(s): AST, ALT, ALKPHOS, BILITOT, PROT, ALBUMIN in the last 168 hours. Recent Labs  Lab 12/01/19 1836  LIPASE 26   No results for input(s): AMMONIA in the last 168 hours. CBC: Recent  Labs  Lab 12/01/19 1836  WBC 5.6  HGB 13.3  HCT 38.3*  MCV 89.5  PLT 181   Cardiac Enzymes: No results for input(s): CKTOTAL, CKMB, CKMBINDEX, TROPONINI in the last 168 hours. BNP: Invalid input(s): POCBNP CBG: No results for input(s): GLUCAP in the last 168 hours. D-Dimer No results for input(s): DDIMER in the last 72 hours. Hgb A1c No results for input(s): HGBA1C in the last 72 hours. Lipid Profile No results for input(s): CHOL, HDL, LDLCALC, TRIG, CHOLHDL, LDLDIRECT in the last 72 hours. Thyroid function studies No results for input(s): TSH, T4TOTAL, T3FREE, THYROIDAB in the last 72 hours.  Invalid input(s): FREET3 Anemia work up No results for input(s): VITAMINB12, FOLATE, FERRITIN, TIBC, IRON, RETICCTPCT in the last 72 hours. Urinalysis    Component Value Date/Time   COLORURINE YELLOW (A) 12/02/2019 0252   APPEARANCEUR HAZY (A)  12/02/2019 0252   LABSPEC 1.030 12/02/2019 0252   PHURINE 5.0 12/02/2019 0252   GLUCOSEU 50 (A) 12/02/2019 0252   HGBUR NEGATIVE 12/02/2019 0252   BILIRUBINUR NEGATIVE 12/02/2019 0252   BILIRUBINUR small (A) 12/01/2019 1627   BILIRUBINUR moderate 01/11/2019 1302   KETONESUR 5 (A) 12/02/2019 0252   PROTEINUR NEGATIVE 12/02/2019 0252   UROBILINOGEN 0.2 12/01/2019 1627   UROBILINOGEN 0.2 06/04/2012 0027   NITRITE NEGATIVE 12/02/2019 0252   LEUKOCYTESUR NEGATIVE 12/02/2019 0252   Sepsis Labs Invalid input(s): PROCALCITONIN,  WBC,  LACTICIDVEN Microbiology Recent Results (from the past 240 hour(s))  Respiratory Panel by RT PCR (Flu A&B, Covid) - Nasopharyngeal Swab     Status: None   Collection Time: 12/02/19  5:03 AM   Specimen: Nasopharyngeal Swab  Result Value Ref Range Status   SARS Coronavirus 2 by RT PCR NEGATIVE NEGATIVE Final    Comment: (NOTE) SARS-CoV-2 target nucleic acids are NOT DETECTED.  The SARS-CoV-2 RNA is generally detectable in upper respiratoy specimens during the acute phase of infection. The lowest concentration of SARS-CoV-2 viral copies this assay can detect is 131 copies/mL. A negative result does not preclude SARS-Cov-2 infection and should not be used as the sole basis for treatment or other patient management decisions. A negative result may occur with  improper specimen collection/handling, submission of specimen other than nasopharyngeal swab, presence of viral mutation(s) within the areas targeted by this assay, and inadequate number of viral copies (<131 copies/mL). A negative result must be combined with clinical observations, patient history, and epidemiological information. The expected result is Negative.  Fact Sheet for Patients:  PinkCheek.be  Fact Sheet for Healthcare Providers:  GravelBags.it  This test is no t yet approved or cleared by the [place] FDA and  has been  authorized for detection and/or diagnosis of SARS-CoV-2 by FDA under an Emergency Use Authorization (EUA). This EUA will remain  in effect (meaning this test can be used) for the duration of the COVID-19 declaration under Section 564(b)(1) of the Act, 21 U.S.C. section 360bbb-3(b)(1), unless the authorization is terminated or revoked sooner.     Influenza A by PCR NEGATIVE NEGATIVE Final   Influenza B by PCR NEGATIVE NEGATIVE Final    Comment: (NOTE) The Xpert Xpress SARS-CoV-2/FLU/RSV assay is intended as an aid in  the diagnosis of influenza from Nasopharyngeal swab specimens and  should not be used as a sole basis for treatment. Nasal washings and  aspirates are unacceptable for Xpert Xpress SARS-CoV-2/FLU/RSV  testing.  Fact Sheet for Patients: PinkCheek.be  Fact Sheet for Healthcare Providers: GravelBags.it  This test is not yet approved  or cleared by the [place] and  has been authorized for detection and/or diagnosis of SARS-CoV-2 by  FDA under an Emergency Use Authorization (EUA). This EUA will remain  in effect (meaning this test can be used) for the duration of the  Covid-19 declaration under Section 564(b)(1) of the Act, 21  U.S.C. section 360bbb-3(b)(1), unless the authorization is  terminated or revoked. Performed at [facility], [address] [place], [place] [address]      Total time spend on discharging this patient, including the last patient exam, discussing the hospital stay, instructions for ongoing care as it relates to all pertinent caregivers, as well as preparing the medical discharge records, prescriptions, and/or referrals as applicable, is 45 minutes.    Enzo Bi, MD  [facility] 12/03/2019, 10:00 AM  If 7PM-7AM, please contact night-coverage

## 2019-12-03 NOTE — Progress Notes (Signed)
Reviewed discharge instructions with pt and spouse. Pt and spouse verbalized understanding. D/C to home with BSC. Obtained vitals. IV cath intact upon removal. Staff wheeled pt out. Pt transported to home via private vehicle.

## 2019-12-05 ENCOUNTER — Telehealth: Payer: Self-pay

## 2019-12-05 DIAGNOSIS — Z9989 Dependence on other enabling machines and devices: Secondary | ICD-10-CM

## 2019-12-05 NOTE — Telephone Encounter (Signed)
Spoke with wife [name]. Notes patient is resting well, taking medication as directed, eating okay, drinking fluids with encouragment.  She is awaiting a call back from [facility] regarding patient's last hospital stay.  Scheduled HFU 01/10/20 with pcp. TCM will not qualify more than 14 days out. Declines sooner appointment. Awaiting [place] follow up.  Patient requests Rx or assistance with rollator walker if possible.  Referral sent to [facility].

## 2019-12-07 NOTE — Telephone Encounter (Signed)
Rtc to wife, she reports Barnett Applebaum wanted him to have the doctor rule out a UTI before she adjusted medication, which has been confirmed he does NOT have a UTI.   She's asking if you need to see him again in the office to make the adjustments. See previous note on his continued behavior.

## 2019-12-07 NOTE — Telephone Encounter (Signed)
Can you please follow up with wife on this question? [phone]

## 2019-12-08 ENCOUNTER — Ambulatory Visit (INDEPENDENT_AMBULATORY_CARE_PROVIDER_SITE_OTHER): Payer: Medicare PPO | Admitting: Cardiology

## 2019-12-08 ENCOUNTER — Other Ambulatory Visit: Payer: Self-pay | Admitting: Adult Health

## 2019-12-08 DIAGNOSIS — G4700 Insomnia, unspecified: Secondary | ICD-10-CM

## 2019-12-08 DIAGNOSIS — F331 Major depressive disorder, recurrent, moderate: Secondary | ICD-10-CM

## 2019-12-08 DIAGNOSIS — Z7901 Long term (current) use of anticoagulants: Secondary | ICD-10-CM | POA: Diagnosis not present

## 2019-12-08 DIAGNOSIS — I359 Nonrheumatic aortic valve disorder, unspecified: Secondary | ICD-10-CM

## 2019-12-08 LAB — POCT INR: INR: 2.9 (ref 2.0–3.0)

## 2019-12-08 MED ORDER — OLANZAPINE 2.5 MG PO TABS: 2.5000 mg | ORAL_TABLET | Freq: Every day | ORAL | 2 refills | Status: DC

## 2019-12-08 NOTE — Patient Instructions (Signed)
Description   Hold dose today then continue taking 10 mg daily except 7 mg each Tuesday and Saturday.  Repeat INR in 1 weeks.

## 2019-12-08 NOTE — Telephone Encounter (Signed)
Will contact wife today.

## 2019-12-09 ENCOUNTER — Telehealth: Payer: Self-pay | Admitting: Family Medicine

## 2019-12-09 DIAGNOSIS — N400 Enlarged prostate without lower urinary tract symptoms: Secondary | ICD-10-CM | POA: Diagnosis not present

## 2019-12-09 DIAGNOSIS — K760 Fatty (change of) liver, not elsewhere classified: Secondary | ICD-10-CM | POA: Diagnosis not present

## 2019-12-09 DIAGNOSIS — I10 Essential (primary) hypertension: Secondary | ICD-10-CM | POA: Diagnosis not present

## 2019-12-09 DIAGNOSIS — K219 Gastro-esophageal reflux disease without esophagitis: Secondary | ICD-10-CM | POA: Diagnosis not present

## 2019-12-09 DIAGNOSIS — J449 Chronic obstructive pulmonary disease, unspecified: Secondary | ICD-10-CM | POA: Diagnosis not present

## 2019-12-09 DIAGNOSIS — K8020 Calculus of gallbladder without cholecystitis without obstruction: Secondary | ICD-10-CM | POA: Diagnosis not present

## 2019-12-09 DIAGNOSIS — F4323 Adjustment disorder with mixed anxiety and depressed mood: Secondary | ICD-10-CM | POA: Diagnosis not present

## 2019-12-09 DIAGNOSIS — E119 Type 2 diabetes mellitus without complications: Secondary | ICD-10-CM | POA: Diagnosis not present

## 2019-12-09 DIAGNOSIS — K5730 Diverticulosis of large intestine without perforation or abscess without bleeding: Secondary | ICD-10-CM | POA: Diagnosis not present

## 2019-12-09 NOTE — Telephone Encounter (Signed)
Zadie Rhine called [phone] in from [facility] for verbal orders for patient he is faxing the orders

## 2019-12-10 ENCOUNTER — Other Ambulatory Visit: Payer: Self-pay | Admitting: Cardiology

## 2019-12-13 ENCOUNTER — Ambulatory Visit: Payer: Medicare PPO | Admitting: Family Medicine

## 2019-12-13 DIAGNOSIS — J449 Chronic obstructive pulmonary disease, unspecified: Secondary | ICD-10-CM | POA: Diagnosis not present

## 2019-12-13 DIAGNOSIS — K5730 Diverticulosis of large intestine without perforation or abscess without bleeding: Secondary | ICD-10-CM | POA: Diagnosis not present

## 2019-12-13 DIAGNOSIS — K8020 Calculus of gallbladder without cholecystitis without obstruction: Secondary | ICD-10-CM | POA: Diagnosis not present

## 2019-12-13 DIAGNOSIS — I10 Essential (primary) hypertension: Secondary | ICD-10-CM | POA: Diagnosis not present

## 2019-12-13 DIAGNOSIS — N400 Enlarged prostate without lower urinary tract symptoms: Secondary | ICD-10-CM | POA: Diagnosis not present

## 2019-12-13 DIAGNOSIS — K760 Fatty (change of) liver, not elsewhere classified: Secondary | ICD-10-CM | POA: Diagnosis not present

## 2019-12-13 DIAGNOSIS — E119 Type 2 diabetes mellitus without complications: Secondary | ICD-10-CM | POA: Diagnosis not present

## 2019-12-13 DIAGNOSIS — F4323 Adjustment disorder with mixed anxiety and depressed mood: Secondary | ICD-10-CM | POA: Diagnosis not present

## 2019-12-13 DIAGNOSIS — K219 Gastro-esophageal reflux disease without esophagitis: Secondary | ICD-10-CM | POA: Diagnosis not present

## 2019-12-14 DIAGNOSIS — K8020 Calculus of gallbladder without cholecystitis without obstruction: Secondary | ICD-10-CM | POA: Diagnosis not present

## 2019-12-14 DIAGNOSIS — N400 Enlarged prostate without lower urinary tract symptoms: Secondary | ICD-10-CM | POA: Diagnosis not present

## 2019-12-14 DIAGNOSIS — K219 Gastro-esophageal reflux disease without esophagitis: Secondary | ICD-10-CM | POA: Diagnosis not present

## 2019-12-14 DIAGNOSIS — E119 Type 2 diabetes mellitus without complications: Secondary | ICD-10-CM | POA: Diagnosis not present

## 2019-12-14 DIAGNOSIS — J449 Chronic obstructive pulmonary disease, unspecified: Secondary | ICD-10-CM | POA: Diagnosis not present

## 2019-12-14 DIAGNOSIS — I10 Essential (primary) hypertension: Secondary | ICD-10-CM | POA: Diagnosis not present

## 2019-12-14 DIAGNOSIS — K5730 Diverticulosis of large intestine without perforation or abscess without bleeding: Secondary | ICD-10-CM | POA: Diagnosis not present

## 2019-12-14 DIAGNOSIS — K760 Fatty (change of) liver, not elsewhere classified: Secondary | ICD-10-CM | POA: Diagnosis not present

## 2019-12-14 DIAGNOSIS — F4323 Adjustment disorder with mixed anxiety and depressed mood: Secondary | ICD-10-CM | POA: Diagnosis not present

## 2019-12-15 ENCOUNTER — Ambulatory Visit (INDEPENDENT_AMBULATORY_CARE_PROVIDER_SITE_OTHER): Payer: Medicare PPO | Admitting: Cardiology

## 2019-12-15 ENCOUNTER — Telehealth: Payer: Self-pay | Admitting: Cardiology

## 2019-12-15 DIAGNOSIS — Z7901 Long term (current) use of anticoagulants: Secondary | ICD-10-CM | POA: Diagnosis not present

## 2019-12-15 DIAGNOSIS — I359 Nonrheumatic aortic valve disorder, unspecified: Secondary | ICD-10-CM

## 2019-12-15 LAB — POCT INR: INR: 2.4 (ref 2.0–3.0)

## 2019-12-15 NOTE — Telephone Encounter (Signed)
Created an Social research officer, government

## 2019-12-15 NOTE — Telephone Encounter (Signed)
Spoke with patients wife who states that patient INR was 2.4 this morning. Patients wife wanted to let pharmacy know and see if they could call her back to let her know the plan for patients warfarin dosage. Advised patients wife I would forward message to pharmD. Patients wife verbalized understanding.

## 2019-12-15 NOTE — Telephone Encounter (Signed)
Patient's wife called to report patient's INR is 2.4 (taken on 12/15/19 at 10:52 AM).

## 2019-12-16 DIAGNOSIS — J449 Chronic obstructive pulmonary disease, unspecified: Secondary | ICD-10-CM | POA: Diagnosis not present

## 2019-12-16 DIAGNOSIS — E119 Type 2 diabetes mellitus without complications: Secondary | ICD-10-CM | POA: Diagnosis not present

## 2019-12-16 DIAGNOSIS — I10 Essential (primary) hypertension: Secondary | ICD-10-CM | POA: Diagnosis not present

## 2019-12-16 DIAGNOSIS — K8020 Calculus of gallbladder without cholecystitis without obstruction: Secondary | ICD-10-CM | POA: Diagnosis not present

## 2019-12-16 DIAGNOSIS — F4323 Adjustment disorder with mixed anxiety and depressed mood: Secondary | ICD-10-CM | POA: Diagnosis not present

## 2019-12-16 DIAGNOSIS — N400 Enlarged prostate without lower urinary tract symptoms: Secondary | ICD-10-CM | POA: Diagnosis not present

## 2019-12-16 DIAGNOSIS — K5730 Diverticulosis of large intestine without perforation or abscess without bleeding: Secondary | ICD-10-CM | POA: Diagnosis not present

## 2019-12-16 DIAGNOSIS — K760 Fatty (change of) liver, not elsewhere classified: Secondary | ICD-10-CM | POA: Diagnosis not present

## 2019-12-16 DIAGNOSIS — K219 Gastro-esophageal reflux disease without esophagitis: Secondary | ICD-10-CM | POA: Diagnosis not present

## 2019-12-19 DIAGNOSIS — F4323 Adjustment disorder with mixed anxiety and depressed mood: Secondary | ICD-10-CM | POA: Diagnosis not present

## 2019-12-19 DIAGNOSIS — I10 Essential (primary) hypertension: Secondary | ICD-10-CM | POA: Diagnosis not present

## 2019-12-19 DIAGNOSIS — N400 Enlarged prostate without lower urinary tract symptoms: Secondary | ICD-10-CM | POA: Diagnosis not present

## 2019-12-19 DIAGNOSIS — E119 Type 2 diabetes mellitus without complications: Secondary | ICD-10-CM | POA: Diagnosis not present

## 2019-12-19 DIAGNOSIS — K760 Fatty (change of) liver, not elsewhere classified: Secondary | ICD-10-CM | POA: Diagnosis not present

## 2019-12-19 DIAGNOSIS — K5730 Diverticulosis of large intestine without perforation or abscess without bleeding: Secondary | ICD-10-CM | POA: Diagnosis not present

## 2019-12-19 DIAGNOSIS — J449 Chronic obstructive pulmonary disease, unspecified: Secondary | ICD-10-CM | POA: Diagnosis not present

## 2019-12-19 DIAGNOSIS — K8020 Calculus of gallbladder without cholecystitis without obstruction: Secondary | ICD-10-CM | POA: Diagnosis not present

## 2019-12-19 DIAGNOSIS — K219 Gastro-esophageal reflux disease without esophagitis: Secondary | ICD-10-CM | POA: Diagnosis not present

## 2019-12-20 ENCOUNTER — Ambulatory Visit: Payer: Medicare PPO | Admitting: Neurology

## 2019-12-21 DIAGNOSIS — F4323 Adjustment disorder with mixed anxiety and depressed mood: Secondary | ICD-10-CM | POA: Diagnosis not present

## 2019-12-21 DIAGNOSIS — N400 Enlarged prostate without lower urinary tract symptoms: Secondary | ICD-10-CM | POA: Diagnosis not present

## 2019-12-21 DIAGNOSIS — K8020 Calculus of gallbladder without cholecystitis without obstruction: Secondary | ICD-10-CM | POA: Diagnosis not present

## 2019-12-21 DIAGNOSIS — I10 Essential (primary) hypertension: Secondary | ICD-10-CM | POA: Diagnosis not present

## 2019-12-21 DIAGNOSIS — K760 Fatty (change of) liver, not elsewhere classified: Secondary | ICD-10-CM | POA: Diagnosis not present

## 2019-12-21 DIAGNOSIS — J449 Chronic obstructive pulmonary disease, unspecified: Secondary | ICD-10-CM | POA: Diagnosis not present

## 2019-12-21 DIAGNOSIS — K219 Gastro-esophageal reflux disease without esophagitis: Secondary | ICD-10-CM | POA: Diagnosis not present

## 2019-12-21 DIAGNOSIS — K5730 Diverticulosis of large intestine without perforation or abscess without bleeding: Secondary | ICD-10-CM | POA: Diagnosis not present

## 2019-12-21 DIAGNOSIS — E119 Type 2 diabetes mellitus without complications: Secondary | ICD-10-CM | POA: Diagnosis not present

## 2019-12-23 ENCOUNTER — Ambulatory Visit (INDEPENDENT_AMBULATORY_CARE_PROVIDER_SITE_OTHER): Payer: Medicare PPO | Admitting: Cardiology

## 2019-12-23 ENCOUNTER — Telehealth: Payer: Self-pay | Admitting: Cardiology

## 2019-12-23 DIAGNOSIS — I359 Nonrheumatic aortic valve disorder, unspecified: Secondary | ICD-10-CM

## 2019-12-23 DIAGNOSIS — Z7901 Long term (current) use of anticoagulants: Secondary | ICD-10-CM

## 2019-12-23 LAB — POCT INR: INR: 3.1 — AB (ref 2.0–3.0)

## 2019-12-23 NOTE — Telephone Encounter (Signed)
Wife of patient called to report an INR reading for the patient of 3.1

## 2019-12-23 NOTE — Telephone Encounter (Signed)
CREATED ANTICOAG ENCOUNTER TO DOSE

## 2019-12-26 ENCOUNTER — Other Ambulatory Visit: Payer: Self-pay | Admitting: Family Medicine

## 2019-12-26 ENCOUNTER — Other Ambulatory Visit: Payer: Self-pay | Admitting: Cardiology

## 2019-12-27 DIAGNOSIS — Z9181 History of falling: Secondary | ICD-10-CM

## 2019-12-27 DIAGNOSIS — E7800 Pure hypercholesterolemia, unspecified: Secondary | ICD-10-CM

## 2019-12-27 DIAGNOSIS — K5730 Diverticulosis of large intestine without perforation or abscess without bleeding: Secondary | ICD-10-CM | POA: Diagnosis not present

## 2019-12-27 DIAGNOSIS — K760 Fatty (change of) liver, not elsewhere classified: Secondary | ICD-10-CM | POA: Diagnosis not present

## 2019-12-27 DIAGNOSIS — M1990 Unspecified osteoarthritis, unspecified site: Secondary | ICD-10-CM

## 2019-12-27 DIAGNOSIS — E119 Type 2 diabetes mellitus without complications: Secondary | ICD-10-CM | POA: Diagnosis not present

## 2019-12-27 DIAGNOSIS — K863 Pseudocyst of pancreas: Secondary | ICD-10-CM

## 2019-12-27 DIAGNOSIS — Z952 Presence of prosthetic heart valve: Secondary | ICD-10-CM

## 2019-12-27 DIAGNOSIS — J449 Chronic obstructive pulmonary disease, unspecified: Secondary | ICD-10-CM | POA: Diagnosis not present

## 2019-12-27 DIAGNOSIS — K8020 Calculus of gallbladder without cholecystitis without obstruction: Secondary | ICD-10-CM | POA: Diagnosis not present

## 2019-12-27 DIAGNOSIS — F4323 Adjustment disorder with mixed anxiety and depressed mood: Secondary | ICD-10-CM | POA: Diagnosis not present

## 2019-12-27 DIAGNOSIS — Z7901 Long term (current) use of anticoagulants: Secondary | ICD-10-CM

## 2019-12-27 DIAGNOSIS — N400 Enlarged prostate without lower urinary tract symptoms: Secondary | ICD-10-CM | POA: Diagnosis not present

## 2019-12-27 DIAGNOSIS — K219 Gastro-esophageal reflux disease without esophagitis: Secondary | ICD-10-CM | POA: Diagnosis not present

## 2019-12-27 DIAGNOSIS — I10 Essential (primary) hypertension: Secondary | ICD-10-CM

## 2019-12-27 DIAGNOSIS — Z8673 Personal history of transient ischemic attack (TIA), and cerebral infarction without residual deficits: Secondary | ICD-10-CM

## 2019-12-27 DIAGNOSIS — G4700 Insomnia, unspecified: Secondary | ICD-10-CM

## 2019-12-28 ENCOUNTER — Ambulatory Visit (INDEPENDENT_AMBULATORY_CARE_PROVIDER_SITE_OTHER): Payer: Medicare PPO | Admitting: Cardiovascular Disease

## 2019-12-28 DIAGNOSIS — I359 Nonrheumatic aortic valve disorder, unspecified: Secondary | ICD-10-CM | POA: Diagnosis not present

## 2019-12-28 DIAGNOSIS — Z7901 Long term (current) use of anticoagulants: Secondary | ICD-10-CM

## 2019-12-28 LAB — POCT INR: INR: 2 (ref 2.0–3.0)

## 2019-12-30 ENCOUNTER — Other Ambulatory Visit: Payer: Self-pay | Admitting: Adult Health

## 2019-12-30 DIAGNOSIS — F331 Major depressive disorder, recurrent, moderate: Secondary | ICD-10-CM

## 2019-12-30 DIAGNOSIS — G4700 Insomnia, unspecified: Secondary | ICD-10-CM

## 2020-01-04 ENCOUNTER — Ambulatory Visit (INDEPENDENT_AMBULATORY_CARE_PROVIDER_SITE_OTHER): Payer: Medicare PPO | Admitting: Pharmacist

## 2020-01-04 DIAGNOSIS — Z952 Presence of prosthetic heart valve: Secondary | ICD-10-CM | POA: Diagnosis not present

## 2020-01-04 DIAGNOSIS — Z7901 Long term (current) use of anticoagulants: Secondary | ICD-10-CM

## 2020-01-04 DIAGNOSIS — Z8673 Personal history of transient ischemic attack (TIA), and cerebral infarction without residual deficits: Secondary | ICD-10-CM

## 2020-01-04 LAB — POCT INR: INR: 3.1 — AB (ref 2.0–3.0)

## 2020-01-10 ENCOUNTER — Ambulatory Visit: Payer: Medicare PPO | Admitting: Family Medicine

## 2020-01-10 ENCOUNTER — Encounter: Payer: Self-pay | Admitting: Family Medicine

## 2020-01-10 ENCOUNTER — Other Ambulatory Visit: Payer: Self-pay

## 2020-01-10 VITALS — BP 125/70 | HR 95 | Temp 98.1°F | Ht 73.0 in | Wt 180.0 lb

## 2020-01-10 DIAGNOSIS — R1011 Right upper quadrant pain: Secondary | ICD-10-CM

## 2020-01-10 DIAGNOSIS — F32A Depression, unspecified: Secondary | ICD-10-CM

## 2020-01-10 DIAGNOSIS — F419 Anxiety disorder, unspecified: Secondary | ICD-10-CM

## 2020-01-10 DIAGNOSIS — R5381 Other malaise: Secondary | ICD-10-CM

## 2020-01-10 DIAGNOSIS — E119 Type 2 diabetes mellitus without complications: Secondary | ICD-10-CM | POA: Diagnosis not present

## 2020-01-10 DIAGNOSIS — K851 Biliary acute pancreatitis without necrosis or infection: Secondary | ICD-10-CM | POA: Diagnosis not present

## 2020-01-10 DIAGNOSIS — G8929 Other chronic pain: Secondary | ICD-10-CM | POA: Diagnosis not present

## 2020-01-10 LAB — HEPATIC FUNCTION PANEL
ALT: 11 U/L (ref 0–53)
AST: 18 U/L (ref 0–37)
Albumin: 4.2 g/dL (ref 3.5–5.2)
Alkaline Phosphatase: 72 U/L (ref 39–117)
Bilirubin, Direct: 0.2 mg/dL (ref 0.0–0.3)
Total Bilirubin: 1.1 mg/dL (ref 0.2–1.2)
Total Protein: 6.9 g/dL (ref 6.0–8.3)

## 2020-01-10 NOTE — Progress Notes (Signed)
Alexander Rumps, MD Phone: [phone]  Alexander Barton is a 69 y.o. male who presents today for f/u.    Depression/anxiety: Patient recently admitted for evaluation of worsening depression, sleep issues, and generalized weakness.  His Haldol was continued by psychiatry inpatient and he was discharged on this.  He has since been switched to Zyprexa which has helped significantly with his sleep.  They note his anxiety is significantly better.  No depression.  He continues to follow with psychiatry.  Deconditioning: Home health PT was recommended while he was in the hospital.  They came to the house once and noted that the things they were having him do were not challenging.  He has not been following with them since then.  He has been drinking more water up to 100 ounces per day.  He is also eating better with plenty of chicken, pork chops, fruits and veggies.  Diabetes: Most recent A1c was 6.8.  Highest glucose recently has been 160.  He has tried to avoid sweet intake.  He declines medication at this time.  Cholelithiasis: Patient has an appointment with general surgery in January.  His gallbladder needs to come out given history of pancreatitis though the need to complete the follow-up completed to get this scheduled.  He has had general anesthesia issues in the past and they wonder about having a block done for the surgery.  He notes minimal intermittent discomfort.  Nothing specific sets it off.  Social History   Tobacco Use  Smoking Status Never Smoker  Smokeless Tobacco Never Used     ROS see history of present illness  Objective  Physical Exam Vitals:   01/10/20 1238 01/10/20 1253  BP: 125/70   Pulse: (!) 113 95  Temp: 98.1 F (36.7 C)   SpO2: 98%     BP Readings from Last 3 Encounters:  01/10/20 125/70  12/03/19 136/69  12/01/19 (!) 143/82   Wt Readings from Last 3 Encounters:  01/10/20 180 lb (81.6 kg)  12/01/19 190 lb (86.2 kg)  12/01/19 192 lb 0.3 oz (87.1  kg)    Physical Exam Constitutional:      General: He is not in acute distress.    Appearance: He is not diaphoretic.  Cardiovascular:     Rate and Rhythm: Normal rate and regular rhythm.     Heart sounds: Normal heart sounds.  Pulmonary:     Effort: Pulmonary effort is normal.     Breath sounds: Normal breath sounds.  Abdominal:     General: Bowel sounds are normal. There is no distension.     Palpations: Abdomen is soft.     Tenderness: There is abdominal tenderness (Very minimal tenderness right upper quadrant). There is no guarding or rebound.  Skin:    General: Skin is warm and dry.  Neurological:     Mental Status: He is alert.      Assessment/Plan: Please see individual problem list.  Problem List Items Addressed This Visit    Anxiety and depression    Improved.  Denies depression.  Anxiety has been improving on Zyprexa.  He will continue to see psychiatry.  Discussed metabolic and hematologic risks from Zyprexa.      Gallstone pancreatitis    History of this in the past.  He does need his gallbladder taken out.  He will keep his appointment with general surgery.  They are on a call list awaiting a cancellation.  Discussed if he had any persistent pain he needs to seek  medical attention immediately.  We will check a hepatic function panel.      Physical deconditioning    Continues to be an issue.  Encouraged to stay active.  Encouraged to drink adequate fluids.  Discussed adequate dietary intake.      Type 2 diabetes mellitus without complications (HCC)    Adequately controlled previously.  I did discuss starting on Metformin to help control this further though he declines.  We will continue to monitor his diet.  We will plan on rechecking at follow-up.       Other Visit Diagnoses    Chronic right upper quadrant pain    -  Primary   Relevant Orders   Hepatic function panel       This visit occurred during the SARS-CoV-2 public health emergency.  Safety  protocols were in place, including screening questions prior to the visit, additional usage of staff PPE, and extensive cleaning of exam room while observing appropriate contact time as indicated for disinfecting solutions.   I have spent 31 minutes in the care of this patient regarding history taking, documentation, chart review, discussion of plan, completion of physical exam.   Alexander Rumps, MD [place]

## 2020-01-10 NOTE — Assessment & Plan Note (Signed)
Adequately controlled previously.  I did discuss starting on Metformin to help control this further though he declines.  We will continue to monitor his diet.  We will plan on rechecking at follow-up.

## 2020-01-10 NOTE — Assessment & Plan Note (Signed)
History of this in the past.  He does need his gallbladder taken out.  He will keep his appointment with general surgery.  They are on a call list awaiting a cancellation.  Discussed if he had any persistent pain he needs to seek medical attention immediately.  We will check a hepatic function panel.

## 2020-01-10 NOTE — Patient Instructions (Signed)
Nice to see you. Please continue to drink an adequate amount of water.  Please seek medical attention immediately if you develop persistent abdominal pain. Please see Dr. Ninfa Linden as planned.

## 2020-01-10 NOTE — Assessment & Plan Note (Signed)
Continues to be an issue.  Encouraged to stay active.  Encouraged to drink adequate fluids.  Discussed adequate dietary intake.

## 2020-01-10 NOTE — Assessment & Plan Note (Addendum)
Improved.  Denies depression.  Anxiety has been improving on Zyprexa.  He will continue to see psychiatry.  Discussed metabolic and hematologic risks from Zyprexa.

## 2020-01-13 ENCOUNTER — Ambulatory Visit (INDEPENDENT_AMBULATORY_CARE_PROVIDER_SITE_OTHER): Payer: Medicare PPO | Admitting: Cardiovascular Disease

## 2020-01-13 DIAGNOSIS — I359 Nonrheumatic aortic valve disorder, unspecified: Secondary | ICD-10-CM | POA: Diagnosis not present

## 2020-01-13 DIAGNOSIS — Z5181 Encounter for therapeutic drug level monitoring: Secondary | ICD-10-CM | POA: Diagnosis not present

## 2020-01-13 DIAGNOSIS — Z7901 Long term (current) use of anticoagulants: Secondary | ICD-10-CM

## 2020-01-13 LAB — POCT INR: INR: 2.8 (ref 2.0–3.0)

## 2020-01-16 ENCOUNTER — Other Ambulatory Visit: Payer: Self-pay

## 2020-01-16 ENCOUNTER — Encounter: Payer: Self-pay | Admitting: Adult Health

## 2020-01-16 ENCOUNTER — Ambulatory Visit (INDEPENDENT_AMBULATORY_CARE_PROVIDER_SITE_OTHER): Payer: Medicare PPO | Admitting: Adult Health

## 2020-01-16 DIAGNOSIS — F22 Delusional disorders: Secondary | ICD-10-CM

## 2020-01-16 DIAGNOSIS — G4700 Insomnia, unspecified: Secondary | ICD-10-CM

## 2020-01-16 DIAGNOSIS — F331 Major depressive disorder, recurrent, moderate: Secondary | ICD-10-CM

## 2020-01-16 MED ORDER — OLANZAPINE 5 MG PO TABS: 5.0000 mg | ORAL_TABLET | Freq: Every day | ORAL | 2 refills | Status: DC

## 2020-01-16 NOTE — Progress Notes (Signed)
Alexander Barton [id number] 12-21-1950 69 y.o.  Subjective:   Patient ID:  Alexander Barton is a 69 y.o. (DOB 23-Dec-1950) male.  Chief Complaint: No chief complaint on file.   HPI Alexander Barton presents to the office today for follow-up of MDD and insomnia.  Accompanied by wife.   Describes mood today as "ok this morning". Pleasant. Mood symptoms - reports decreased depression, anxiety, and irritability. Had an "episode" over the weekend. Started pacing. Delusional. Has been getting out more and doing things with family and friends. Wife uncertain as to whether had had pushed him too "much" - overstimulated. Also reports a night of not sleeping well. Called EMS yesterday for assistance. Advised to take Haldol 2.5 twice daily. Wife notes he did sleep last night. Per wife mental status normal this morning. Patient drowsy and sleeping during most of appointment. Defaulted to wife for information. Does feel like the Zyprexa has been a good fit for him and will increase dose to 49m at bedtime. Improved interest and motivation. Taking medications as prescribed.  Energy levels low - feels weak. Active, does not have a current exercise routine.  Enjoys some usual interests and activities. Married. Lives with wife of 526years. 2 children and 3 grandsons. Spending time with family. Appetite adequate. Weight - 180 pounds. Sleeps well most nights. Averages 8 to 10 hours. Had not been napping during the day.  Focus and concentration difficulties. Completing minimal tasks. Unable to manage aspects of household. 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  Denies AH or VH.  Previous medication trials: 2008 - Seroquel - allergy. Haldol 522mat bedtime Cogentin.     Mini-Mental   Flowsheet Row Clinical Support from 01/13/2018 in [facility] 12/22/2016 [facility]max [facility] points ) _0 PHQ2-9   [place] from 01/10/2020 in [facility] [place] 10/03/2019 in [facility] from 05/25/2019 in [facility] 01/19/2019 [facility]  PHQ-2 Total Score 0 0 0 0 0  PHQ-9 Total Score -- -- -- -- 8       Review of Systems:  Review of Systems  Musculoskeletal: Negative for gait problem.  Neurological: Negative for tremors.  Psychiatric/Behavioral:       Please refer to HPI    Medications: I have reviewed the patient's current medications.  Current Outpatient Medications  Medication Sig Dispense Refill  . acetaminophen (TYLENOL) 500 MG tablet Take 1,000 mg by mouth every 6 (six) hours as needed for mild pain.    . Marland Kitchenlbuterol (PROAIR HFA) 108 (90 Base) MCG/ACT inhaler Inhale 2 puffs into the lungs every 4 (four) hours as needed for wheezing or shortness of breath.    . blood glucose meter kit and supplies KIT Dispense based on patient and insurance preference. Use twice daily. (FOR ICD-10 E11.9). 1 each 0  . Budeson-Glycopyrrol-Formoterol (BREZTRI AEROSPHERE) 160-9-4.8 MCG/ACT AERO Inhale 2 puffs into the lungs in the morning and at bedtime. 10.7 g 3  . glucose blood test strip Use twice daily E11.9 100 each 1  . Investigational - Study Medication Take 1 tablet by mouth See admin instructions. Study name: [id number] Additional study details: cholesterol [id number]  Take 1 tablet by mouth in the morning    . OLANZapine (ZYPREXA) 5 MG tablet Take 1 tablet (5 mg total) by mouth at bedtime. 30 tablet 2  . OVER THE COUNTER MEDICATION Take 1 capsule by mouth See admin instructions. Dotera Digestive Supplement capsule- Take 1 capsule by mouth in the morning    . polyethylene glycol (MIRALAX / GLYCOLAX) 17 g packet  Take 17 g by mouth in the morning. [place]    . senna (SENOKOT) 8.6 MG TABS tablet Take 2 tablets by mouth in the morning and at bedtime.    . tamsulosin (FLOMAX) 0.4 MG CAPS capsule TAKE 2 CAPSULES BY MOUTH EVERY DAY 174 capsule 1  . warfarin (COUMADIN) 2 MG tablet TAKE 1 TABLET WITH 5 MG TABLET DAILY AS DIRECTED BY COUMADIN CLINIC 90 tablet 0  . warfarin (COUMADIN) 5 MG tablet TAKE 2 TABLETS BY MOUTH DAILY OR AS DIRECTED 180 tablet 0   No current facility-administered medications for this visit.    Medication Side Effects: None  Allergies:  Allergies  Allergen Reactions  . Codeine Anaphylaxis  . Gadolinium Derivatives Other (See Comments)    Intense sneezing from MRI contrast  . Hydrocodone Anaphylaxis  . Iodinated Diagnostic Agents Shortness Of Breath and Other (See Comments)    Uncontrollable sneezing and "shortness of breath after prep"- needs sedative the night before and hour and benadryl before using  . Other Other (See Comments)    Unnamed anesthesia- Patient is in the E.D. possibly due to the effects of it: Delirium, confusion, and sleep deprivation  . Statins Other (See Comments)    Muscle pain  . Antihistamines, Diphenhydramine-Type Other (See Comments)    Unable to urinate. All antihistamines.   . Hydromorphone Other (See Comments)    MUSCLE PAIN  . Lipitor [Atorvastatin Calcium] Other (See Comments)    Muscle pain and a "knot" appears on his right side, near the ribcage  . Seroquel [Quetiapine Fumerate] Swelling and Other (See Comments)    "bad trip" and tongue became swollen    Past Medical History:  Diagnosis Date  . Aortic aneurysm, thoracic ([place])   . Aortic valve disease   . Arthritis   . BPH (benign prostatic hyperplasia)   . COPD (chronic obstructive pulmonary disease) ([place])   . GERD (gastroesophageal reflux disease)   . HTN (hypertension)   . Hypercholesterolemia   . Stroke ([place])   . TIA (transient ischemic attack)     Family History   Problem Relation Age of Onset  . Alzheimer's disease Father   . Diabetes Father        age onset DM  . Hypertension Sister   . Hypertension Brother   . Diabetes Mother   . Diabetes Sister   . Colon cancer Neg Hx     Social History   Socioeconomic History  . Marital status: Married    Spouse name: Not on file  . Number of children: 2  . Years of education: Not on file  . Highest education level: Not on file  Occupational History  . Occupation: Scientist, clinical (histocompatibility and immunogenetics): [employer]  Tobacco Use  . Smoking status: Never Smoker  . Smokeless tobacco: Never Used  Substance and Sexual Activity  . Alcohol use: No    Alcohol/week: 0.0 standard drinks  . Drug use: No  . Sexual activity: Not Currently  Other Topics Concern  . 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   One son and one daughter   He lives in [place]   One caffeinated beverage daily   08/30/2014   Social Determinants of Health   Financial Resource Strain: Not on file  Food Insecurity: Not on file  Transportation Needs: Not on file  Physical Activity: Not on file  Stress: Not on file  Social Connections: Not on file  Intimate Partner Violence: Not on file    Past Medical History, Surgical history, Social history, and Family history were reviewed and updated as appropriate.   Please see review of systems for further details on the patient's review from today.   Objective:   Physical Exam:  There were no vitals taken for this visit.  Physical Exam Constitutional:      General: He is not in acute distress. Musculoskeletal:        General: No deformity.  Neurological:     Mental Status: He is alert and oriented to person, place, and time.     Coordination: Coordination normal.  Psychiatric:        Attention and Perception: Attention and perception normal. He does not perceive auditory or visual  hallucinations.        Mood and Affect: Mood normal. Mood is not anxious or depressed. Affect is not labile, blunt, angry or inappropriate.        Speech: Speech normal.        Behavior: Behavior normal.        Thought Content: Thought content normal. Thought content is not paranoid or delusional. Thought content does not include homicidal or suicidal ideation. Thought content does not include homicidal or suicidal plan.        Cognition and Memory: Cognition and memory normal.        Judgment: Judgment normal.     Comments: Insight intact     Lab Review:     Component Value Date/Time   NA 137 12/03/2019 0714   NA 137 01/10/2019 1228   K 3.6 12/03/2019 0714   CL 106 12/03/2019 0714   CO2 24 12/03/2019 0714   GLUCOSE 138 (H) 12/03/2019 0714   BUN 14 12/03/2019 0714   BUN 14 01/10/2019 1228   CREATININE 1.03 12/03/2019 0714   CALCIUM 8.9 12/03/2019 0714   PROT 6.9 01/10/2020 1254   PROT 6.6 01/10/2019 1228   ALBUMIN 4.2 01/10/2020 1254   ALBUMIN 3.7 (L) 01/10/2019 1228   AST 18 01/10/2020 1254   ALT 11 01/10/2020 1254   ALKPHOS 72 01/10/2020 1254   BILITOT 1.1 01/10/2020 1254   BILITOT 0.8 01/10/2019 1228   GFRNONAA >60 12/03/2019 0714   GFRAA >60 09/22/2019 1831       Component Value Date/Time   WBC 5.6 12/01/2019 1836   RBC 4.28 12/01/2019 1836   HGB 13.3 12/01/2019 1836   HGB 9.3 (L) 01/10/2019 1228   HCT 38.3 (L) 12/01/2019 1836   HCT 28.4 (L) 01/10/2019 1228   PLT 181 12/01/2019 1836   PLT 305 01/10/2019 1228   MCV 89.5 12/01/2019 1836   MCV 90 01/10/2019 1228   MCH 31.1 12/01/2019 1836   MCHC 34.7 12/01/2019 1836   RDW 13.8 12/01/2019 1836   RDW 14.3 01/10/2019 1228   LYMPHSABS 2.0 09/22/2019 1831   MONOABS 0.6 09/22/2019 1831   EOSABS 0.1 09/22/2019 1831   BASOSABS 0.0 09/22/2019 1831    No results found for: POCLITH, LITHIUM  No results found for: PHENYTOIN, PHENOBARB, VALPROATE, CBMZ   .res Assessment: Plan:    Plan:  PDMP reviewed  1.  Increase Zyprexa at 2.90m to 550m  RTC 4 weeks  Patient advised to contact office with any questions, adverse effects, or acute worsening in signs and symptoms.  Discussed potential metabolic side effects associated with atypical antipsychotics, as well as potential risk for movement side effects. Advised pt to contact office if movement side effects occur.       Diagnoses and all orders for this visit:  Delusional disorder ([place] Major depressive disorder, recurrent episode, moderate (HCC) -     OLANZapine (ZYPREXA) 5 MG tablet; Take 1 tablet (5 mg total) by mouth at bedtime.  Insomnia, unspecified type -     OLANZapine (ZYPREXA) 5 MG tablet; Take 1 tablet (5 mg total) by mouth at bedtime.     Please see After Visit Summary for patient specific instructions.  Future Appointments  Date Time Provider DeLawrence12/17/2021  8:30 AM O'Dia CrawfordLPN [facility]  02/11/63/78461:00 AM SoLeone HavenMD [facility]  02/29/2020  3:40 PM JoMartiniquePeter M, MD [facility]2/14/2022 10:00 AM , ReBerdie OgrenNP [facility] None  04/09/2020 10:00 AM SoLeone HavenMD [facility]    No orders of the defined types were placed in this encounter.   -------------------------------

## 2020-01-19 ENCOUNTER — Telehealth: Payer: Self-pay | Admitting: Adult Health

## 2020-01-19 NOTE — Telephone Encounter (Signed)
Pt's wife called and said that the lorazapam is helping. Yacine feels wek and washed out but it is helping. This is just an [place].

## 2020-01-19 NOTE — Telephone Encounter (Signed)
Noted  

## 2020-01-20 ENCOUNTER — Ambulatory Visit (INDEPENDENT_AMBULATORY_CARE_PROVIDER_SITE_OTHER): Payer: Medicare PPO

## 2020-01-20 VITALS — BP 107/62 | HR 118 | Ht 73.0 in | Wt 180.0 lb

## 2020-01-20 DIAGNOSIS — Z0000 Encounter for general adult medical examination without abnormal findings: Secondary | ICD-10-CM

## 2020-01-20 NOTE — Patient Instructions (Addendum)
Alexander Barton , Thank you for taking time to come for your Medicare Wellness Visit. I appreciate your ongoing commitment to your health goals. Please review the following plan we discussed and let me know if I can assist you in the future.   These are the goals we discussed: Goals    . Follow up with Primary Care Provider     As needed       This is a list of the screening recommended for you and due dates:  Health Maintenance  Topic Date Due  . Complete foot exam   Never done  . COVID-19 Vaccine (3 - Booster for Moderna series) 11/15/2019  . Hemoglobin A1C  05/14/2020  . Eye exam for diabetics  07/18/2020  . Urine Protein Check  11/13/2020  . Tetanus Vaccine  08/11/2022  . Colon Cancer Screening  10/11/2024  . Flu Shot  Completed  .  Hepatitis C: One time screening is recommended by Center for Disease Control  (CDC) for  adults born from [birth year] through 1967.   Completed  . Pneumonia vaccines  Completed     Immunizations Immunization History  Administered Date(s) Administered  . Fluad Quad(high Dose 65+) 11/14/2019  . Influenza Split 02/14/2011  . Influenza, High Dose Seasonal PF 11/04/2016, 11/23/2017  . Influenza,inj,Quad PF,6+ Mos 11/14/2014  . Influenza-Unspecified 11/14/2014  . Moderna Sars-Covid-2 Vaccination 04/18/2019, 05/16/2019  . Pneumococcal Conjugate-13 11/14/2014  . Pneumococcal Polysaccharide-23 01/04/2007, 12/05/2015  . Td 02/04/2000  . Tdap 08/10/2012  . Zoster 10/04/2010   Keep all routine maintenance appointments.   Follow up 02/15/20 @ 11:00  Follow up 04/09/20 @ 10:00  Advanced directives: Completed, on file.  Conditions/risks identified: None new.   Follow up in one year for your annual wellness visit.   Preventive Care 61 Years and Older, Male Preventive care refers to lifestyle choices and visits with your health care provider that can promote health and wellness. What does preventive care include?  A yearly physical exam. This is also  called an annual well check.  Dental exams once or twice a year.  Routine eye exams. Ask your health care provider how often you should have your eyes checked.  Personal lifestyle choices, including:  Daily care of your teeth and gums.  Regular physical activity.  Eating a healthy diet.  Avoiding tobacco and drug use.  Limiting alcohol use.  Practicing safe sex.  Taking low-dose aspirin every day.  Taking vitamin and mineral supplements as recommended by your health care provider. What happens during an annual well check? The services and screenings done by your health care provider during your annual well check will depend on your age, overall health, lifestyle risk factors, and family history of disease. Counseling  Your health care provider may ask you questions about your:  Alcohol use.  Tobacco use.  Drug use.  Emotional well-being.  Home and relationship well-being.  Sexual activity.  Eating habits.  History of falls.  Memory and ability to understand (cognition).  Work and work Statistician.  Reproductive health. Screening  You may have the following tests or measurements:  Height, weight, and BMI.  Blood pressure.  Lipid and cholesterol levels. These may be checked every 5 years, or more frequently if you are over 51 years old.  Skin check.  Lung cancer screening. You may have this screening every year starting at age 66 if you have a 30-pack-year history of smoking and currently smoke or have quit within the past 15 years.  Fecal  occult blood test (FOBT) of the stool. You may have this test every year starting at age 49.  Flexible sigmoidoscopy or colonoscopy. You may have a sigmoidoscopy every 5 years or a colonoscopy every 10 years starting at age 73.  Hepatitis C blood test.  Hepatitis B blood test.  Sexually transmitted disease (STD) testing.  Diabetes screening. This is done by checking your blood sugar (glucose) after you have not  eaten for a while (fasting). You may have this done every 1-3 years.  Bone density scan. This is done to screen for osteoporosis. You may have this done starting at age 35.  Mammogram. This may be done every 1-2 years. Talk to your health care provider about how often you should have regular mammograms. Talk with your health care provider about your test results, treatment options, and if necessary, the need for more tests. Vaccines  Your health care provider may recommend certain vaccines, such as:  Influenza vaccine. This is recommended every year.  Tetanus, diphtheria, and acellular pertussis (Tdap, Td) vaccine. You may need a Td booster every 10 years.  Zoster vaccine. You may need this after age 78.  Pneumococcal 13-valent conjugate (PCV13) vaccine. One dose is recommended after age 65.  Pneumococcal polysaccharide (PPSV23) vaccine. One dose is recommended after age 38. Talk to your health care provider about which screenings and vaccines you need and how often you need them. This information is not intended to replace advice given to you by your health care provider. Make sure you discuss any questions you have with your health care provider. Document Released: 02/16/2015 Document Revised: 10/10/2015 Document Reviewed: 11/21/2014 Elsevier Interactive Patient Education  2017 [place] Prevention in the Home Falls can cause injuries. They can happen to people of all ages. There are many things you can do to make your home safe and to help prevent falls. What can I do on the outside of my home?  Regularly fix the edges of walkways and driveways and fix any cracks.  Remove anything that might make you trip as you walk through a door, such as a raised step or threshold.  Trim any bushes or trees on the path to your home.  Use bright outdoor lighting.  Clear any walking paths of anything that might make someone trip, such as rocks or tools.  Regularly check to see if  handrails are loose or broken. Make sure that both sides of any steps have handrails.  Any raised decks and porches should have guardrails on the edges.  Have any leaves, snow, or ice cleared regularly.  Use sand or salt on walking paths during winter.  Clean up any spills in your garage right away. This includes oil or grease spills. What can I do in the bathroom?  Use night lights.  Install grab bars by the toilet and in the tub and shower. Do not use towel bars as grab bars.  Use non-skid mats or decals in the tub or shower.  If you need to sit down in the shower, use a plastic, non-slip stool.  Keep the floor dry. Clean up any water that spills on the floor as soon as it happens.  Remove soap buildup in the tub or shower regularly.  Attach bath mats securely with double-sided non-slip rug tape.  Do not have throw rugs and other things on the floor that can make you trip. What can I do in the bedroom?  Use night lights.  Make sure that you  have a light by your bed that is easy to reach.  Do not use any sheets or blankets that are too big for your bed. They should not hang down onto the floor.  Have a firm chair that has side arms. You can use this for support while you get dressed.  Do not have throw rugs and other things on the floor that can make you trip. What can I do in the kitchen?  Clean up any spills right away.  Avoid walking on wet floors.  Keep items that you use a lot in easy-to-reach places.  If you need to reach something above you, use a strong step stool that has a grab bar.  Keep electrical cords out of the way.  Do not use floor polish or wax that makes floors slippery. If you must use wax, use non-skid floor wax.  Do not have throw rugs and other things on the floor that can make you trip. What can I do with my stairs?  Do not leave any items on the stairs.  Make sure that there are handrails on both sides of the stairs and use them. Fix  handrails that are broken or loose. Make sure that handrails are as long as the stairways.  Check any carpeting to make sure that it is firmly attached to the stairs. Fix any carpet that is loose or worn.  Avoid having throw rugs at the top or bottom of the stairs. If you do have throw rugs, attach them to the floor with carpet tape.  Make sure that you have a light switch at the top of the stairs and the bottom of the stairs. If you do not have them, ask someone to add them for you. What else can I do to help prevent falls?  Wear shoes that:  Do not have high heels.  Have rubber bottoms.  Are comfortable and fit you well.  Are closed at the toe. Do not wear sandals.  If you use a stepladder:  Make sure that it is fully opened. Do not climb a closed stepladder.  Make sure that both sides of the stepladder are locked into place.  Ask someone to hold it for you, if possible.  Clearly mark and make sure that you can see:  Any grab bars or handrails.  First and last steps.  Where the edge of each step is.  Use tools that help you move around (mobility aids) if they are needed. These include:  Canes.  Walkers.  Scooters.  Crutches.  Turn on the lights when you go into a dark area. Replace any light bulbs as soon as they burn out.  Set up your furniture so you have a clear path. Avoid moving your furniture around.  If any of your floors are uneven, fix them.  If there are any pets around you, be aware of where they are.  Review your medicines with your doctor. Some medicines can make you feel dizzy. This can increase your chance of falling. Ask your doctor what other things that you can do to help prevent falls. This information is not intended to replace advice given to you by your health care provider. Make sure you discuss any questions you have with your health care provider. Document Released: 11/16/2008 Document Revised: 06/28/2015 Document Reviewed:  02/24/2014 Elsevier Interactive Patient Education  2017 [employer].

## 2020-01-20 NOTE — Progress Notes (Signed)
Subjective:   Alexander Barton is a 69 y.o. male who presents for Medicare Annual/Subsequent preventive examination.  Review of Systems    No ROS.  Medicare Wellness Virtual Visit.    Cardiac Risk Factors include: advanced age (>49mn, >>80women)     Objective:    Today's Vitals   01/20/20 0834  BP: 107/62  Pulse: (!) 118  SpO2: 97%  Weight: 180 lb (81.6 kg)  Height: '6\' 1"'  (1.854 m)   Body mass index is 23.75 kg/m.  Advanced Directives 01/20/2020 12/02/2019 12/01/2019 09/24/2019 05/29/2019 02/16/2019 01/19/2019  Does Patient Have a Medical Advance Directive? Yes Yes Yes No No Yes Yes  Type of AParamedicof AAdaLiving will Living will Living will - - Living will;Healthcare Power of [place]ew [place]  Does patient want to make changes to medical advance directive? No - Patient declined No - Patient declined - - - No - Patient declined No - Patient declined  Copy of HLake Dalecar[place]ain Chart? Yes - validated most recent copy scanned in chart (See row information) - - - - - Yes - validated most recent copy scanned in chart (See row information)  Would patient like information on creating a medical advance directive? - - - No - Patient declined No - Patient declined - -    Current Medications (verified) Outpatient Encounter Medications as of 01/20/2020  Medication Sig  . acetaminophen (TYLENOL) 500 MG tablet Take 1,000 mg by mouth every 6 (six) hours as needed for mild pain.  .Marland Kitchenalbuterol (PROAIR HFA) 108 (90 Base) MCG/ACT inhaler Inhale 2 puffs into the lungs every 4 (four) hours as needed for wheezing or shortness of breath.  . blood glucose meter kit and supplies KIT Dispense based on patient and insurance preference. Use twice daily. (FOR ICD-10 E11.9).  . Budeson-Glycopyrrol-Formoterol (BREZTRI AEROSPHERE) 160-9-4.8 MCG/ACT AERO Inhale 2 puffs into the lungs in the morning and at bedtime.  .Marland Kitchenglucose  blood test strip Use twice daily E11.9  . Investigational - Study Medication Take 1 tablet by mouth See admin instructions. Study name: [id number] Additional study details: [facility]3([phone] Take 1 tablet by mouth in the morning  . OLANZapine (ZYPREXA) 5 MG tablet Take 1 tablet (5 mg total) by mouth at bedtime.  .Marland KitchenOVER THE COUNTER MEDICATION Take 1 capsule by mouth See admin instructions. Dotera Digestive Supplement capsule- Take 1 capsule by mouth in the morning  . polyethylene glycol (MIRALAX / GLYCOLAX) 17 g packet Take 17 g by mouth in the morning. [place] . senna (SENOKOT) 8.6 MG TABS tablet Take 2 tablets by mouth in the morning and at bedtime.  . tamsulosin (FLOMAX) 0.4 MG CAPS capsule TAKE 2 CAPSULES BY MOUTH EVERY DAY  . warfarin (COUMADIN) 2 MG tablet TAKE 1 TABLET WITH 5 MG TABLET DAILY AS DIRECTED BY COUMADIN CLINIC  . warfarin (COUMADIN) 5 MG tablet TAKE 2 TABLETS BY MOUTH DAILY OR AS DIRECTED  . [DISCONTINUED] enoxaparin (LOVENOX) 80 MG/0.8ML injection Inject 0.8 mLs (80 mg total) into the skin every 12 (twelve) hours. (Patient not taking: Reported on 09/22/2019)   No facility-administered encounter medications on file as of 01/20/2020.    Allergies (verified) Codeine; Gadolinium derivatives; Hydrocodone; Iodinated diagnostic agents; Other; Statins; Antihistamines, diphenhydramine-type; Hydromorphone; Lipitor [atorvastatin calcium]; and Seroquel [quetiapine fumerate]   History: Past Medical History:  Diagnosis Date  . Aortic aneurysm, thoracic ([place]   . Aortic valve disease   .  Arthritis   . BPH (benign prostatic hyperplasia)   . COPD (chronic obstructive pulmonary disease) ([place])   . GERD (gastroesophageal reflux disease)   . HTN (hypertension)   . Hypercholesterolemia   . Stroke ([place])   . TIA (transient ischemic attack)    Past Surgical History:  Procedure Laterality Date  . ACHILLES TENDON REPAIR    . AORTIC VALVE REPLACEMENT     #23  On-X valve conduit  . ASCENDING AORTIC ANEURYSM REPAIR    . BACK SURGERY    . FOOT SURGERY     Family History  Problem Relation Age of Onset  . Alzheimer's disease Father   . Diabetes Father        age onset DM  . Hypertension Sister   . Hypertension Brother   . Diabetes Mother   . Heart attack Sister   . Diabetes Sister   . Colon cancer Neg Hx    Social History   Socioeconomic History  . Marital status: Married    Spouse name: Not on file  . Number of children: 2  . Years of education: Not on file  . Highest education level: Not on file  Occupational History  . Occupation: Scientist, clinical (histocompatibility and immunogenetics): [employer]  Tobacco Use  . Smoking status: Never Smoker  . Smokeless tobacco: Never Used  Substance and Sexual Activity  . Alcohol use: No    Alcohol/week: 0.0 standard drinks  . Drug use: No  . Sexual activity: Not Currently  Other Topics Concern  . 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   One son and one daughter   He lives in [place]   One caffeinated beverage daily   08/30/2014   Social Determinants of Health   Financial Resource Strain: Low Risk   . Difficulty of Paying Living Expenses: Not hard at all  Food Insecurity: No Food Insecurity  . Worried About Charity fundraiser in the Last Year: Never true  . Ran Out of Food in the Last Year: Never true  Transportation Needs: No Transportation Needs  . Lack of Transportation (Medical): No  . Lack of Transportation (Non-Medical): No  Physical Activity: Not on file  Stress: No Stress Concern Present  . Feeling of Stress : Not at all  Social Connections: Unknown  . Frequency of Communication with Friends and Family: Not on file  . Frequency of Social Gatherings with Friends and Family: Not on file  . Attends Religious Services: Not on file  . Active Member of Clubs or Organizations: Not on file  . Attends Theatre manager Meetings: Not on file  . Marital Status: Married    Tobacco Counseling Counseling given: Not Answered   Clinical Intake:  Pre-visit preparation completed: Yes        Diabetes: Yes (Followed by PCP)  How often do you need to have someone help you when you read instructions, pamphlets, or other written materials from your doctor or pharmacy?: 2 - Rarely  Nutrition Risk Assessment: Has the patient had any N/V/D within the last 2 weeks?  No  Does the patient have any non-healing wounds?  No  Has the patient had any unintentional weight loss or weight gain?  No   Diabetes: If diabetic, was a CBG obtained today? FBS 154. Notes eating cookies last night, but not generally that high.  Did the patient  bring in their glucometer from home?  No  How often do you monitor your CBG's? Once a week.   Diabetes Management: Does the patient want to be seen by Chronic Care Management for management of their diabetes?  No  Would the patient like to be referred to a Nutritionist or for Diabetic Management?  No   Diabetic Exams: Diabetic Eye Exam: Completed 07/19/19 Diabetic Foot Exam- Followed by PCP. Notes no wounds or changes in feet.   INR 2.8, completed 01/13/20. Second check scheduled in 2 weeks.   Interpreter Needed?: No      Activities of Daily Living In your present state of health, do you have any difficulty performing the following activities: 01/20/2020 12/02/2019  Hearing? N N  Vision? N N  Difficulty concentrating or making decisions? N N  Walking or climbing stairs? N Y  Dressing or bathing? N Y  Doing errands, shopping? - Y  Preparing Food and eating ? Y -  Comment Wife meal preps. Self feeds. -  Using the Toilet? N -  In the past six months, have you accidently leaked urine? N -  Do you have problems with loss of bowel control? N -  Managing your Medications? Y -  Managing your Finances? Y -  Housekeeping or managing your Housekeeping? Y -  Comment  Wife assist -  Some recent data might be hidden    Patient Care Team: Leone Haven, MD as PCP - General (Family Medicine) [name], [name] M, MD as PCP - Cardiology (Cardiology)  Indicate any recent Medical Services you may have received from other than [facility] providers in the past year (date may be approximate).     Assessment:   This is a routine wellness examination for Gwyndolyn Saxon.  I connected with Auther today by telephone and verified that I am speaking with the correct person using two identifiers. Location patient: home Location provider: work Persons participating in the virtual visit: patient, wife, Marine scientist.    I discussed the limitations, risks, security and privacy concerns of performing an evaluation and management service by telephone and the availability of in person appointments. The patient expressed understanding and verbally consented to this telephonic visit.    Interactive audio and video telecommunications were attempted between this provider and patient, however failed, due to patient having technical difficulties OR patient did not have access to video capability.  We continued and completed visit with audio only.  Some vital signs may be absent or patient reported.   Hearing/Vision screen  Hearing Screening   '125Hz'  '250Hz'  '500Hz'  '1000Hz'  '2000Hz'  '3000Hz'  '4000Hz'  '6000Hz'  '8000Hz'   Right ear:           Left ear:           Comments: Patient is able to hear conversational tones without difficulty.  No issues reported.  Vision Screening Comments: Followed by [facility] (Dr. C)  Wears corrective lenses  Visual acuity not assessed per patient preference since they have regular follow up with the ophthalmologist  Dietary issues and exercise activities discussed: Current Exercise Habits: Home exercise routine, Type of exercise: walking, Intensity: Mild  Low carb diet Good water intake  Goals    . Follow up with Primary Care Provider     As needed      Depression Screen PHQ  2/9 Scores 01/20/2020 01/10/2020 10/03/2019 05/25/2019 01/19/2019 10/18/2018 09/22/2018  PHQ - 2 Score - 0 0 0 0 0 2  PHQ- 9 Score - - - - - 8 7  Exception Documentation Other- indicate reason in comment box - - - - - -  Not completed Counseling with psychiatry every 60 days - - - - - -    Fall Risk Fall Risk  01/20/2020 01/10/2020 09/20/2019 05/25/2019 01/19/2019  Falls in the past year? 0 0 1 0 1  Number falls in past yr: 0 0 0 0 0  Injury with Fall? - - 0 - 0  Comment - - - - Unsteady without walker and stumbled. Now uses walker daily.  Risk for fall due to : - - History of fall(s) - -  Follow up Falls evaluation completed Falls evaluation completed Falls evaluation completed Falls evaluation completed Falls prevention discussed;Education provided    FALL RISK PREVENTION PERTAINING TO THE HOME: Handrails in use when climbing stairs? Yes Home free of loose throw rugs in walkways, pet beds, electrical cords, etc? Yes  Adequate lighting in your home to reduce risk of falls? Yes   ASSISTIVE DEVICES UTILIZED TO PREVENT FALLS: Use of a cane, walker or w/c? No    TIMED UP AND GO: Was the test performed? No .   Cognitive Function: Patient is alert. Declines MMSE/6CIT. MMSE - Mini Mental State Exam 01/13/2018 12/22/2016 12/05/2015  Orientation to time '5 5 5  ' Orientation to Place '5 5 5  ' Registration '3 3 3  ' Attention/ Calculation '5 5 5  ' Recall '3 3 3  ' Language- name 2 objects '2 2 2  ' Language- repeat '1 1 1  ' Language- follow 3 step command '3 3 3  ' Language- read & follow direction '1 1 1  ' Write a sentence '1 1 1  ' Copy design '1 1 1  ' Total score '30 30 30     ' 6CIT Screen 01/19/2019  What Year? 0 points  What month? 0 points  What time? 0 points    Immunizations Immunization History  Administered Date(s) Administered  . Fluad Quad(high Dose 65+) 11/14/2019  . Influenza Split 02/14/2011  . Influenza, High Dose Seasonal PF 11/04/2016, 11/23/2017  . Influenza,inj,Quad PF,6+ Mos 11/14/2014   . Influenza-Unspecified 11/14/2014  . Moderna Sars-Covid-2 Vaccination 04/18/2019, 05/16/2019  . Pneumococcal Conjugate-13 11/14/2014  . Pneumococcal Polysaccharide-23 01/04/2007, 12/05/2015  . Td 02/04/2000  . Tdap 08/10/2012  . Zoster 10/04/2010   Health Maintenance Health Maintenance  Topic Date Due  . FOOT EXAM  Never done  . COVID-19 Vaccine (3 - Booster for Moderna series) 11/15/2019  . HEMOGLOBIN A1C  05/14/2020  . OPHTHALMOLOGY EXAM  07/18/2020  . URINE MICROALBUMIN  11/13/2020  . TETANUS/TDAP  08/11/2022  . COLONOSCOPY  10/11/2024  . INFLUENZA VACCINE  Completed  . Hepatitis C Screening  Completed  . PNA vac Low Risk Adult  Completed   Colorectal cancer screening: Type of screening: Colonoscopy. Completed 10/12/14. Repeat every 10 years  Lung Cancer Screening: (Low Dose CT Chest recommended if Age 32-80 years, 30 pack-year currently smoking OR have quit w/in 15years.) does not qualify.   Hepatitis C Screening: Completed 12/22/16.  Vision Screening: Recommended annual ophthalmology exams for early detection of glaucoma and other disorders of the eye. Is the patient up to date with their annual eye exam?  Yes   Dental Screening: Recommended annual dental exams for proper oral hygiene.  Community Resource Referral / Chronic Care Management: CRR required this visit?  No   CCM required this visit?  No      Plan:   Keep all routine maintenance appointments.   Follow up 02/15/20 @ 11:00  Follow up 04/09/20 @  10:00  I have personally reviewed and noted the following in the patient's chart:   . Medical and social history . Use of alcohol, tobacco or illicit drugs  . Current medications and supplements . Functional ability and status . Nutritional status . Physical activity . Advanced directives . List of other physicians . Hospitalizations, surgeries, and ER visits in previous 12 months . Vitals . Screenings to include cognitive, depression, and  falls . Referrals and appointments  In addition, I have reviewed and discussed with patient certain preventive protocols, quality metrics, and best practice recommendations. A written personalized care plan for preventive services as well as general preventive health recommendations were provided to patient via mychart.     Varney Biles, LPN   80/32/1224

## 2020-01-26 ENCOUNTER — Ambulatory Visit (INDEPENDENT_AMBULATORY_CARE_PROVIDER_SITE_OTHER): Payer: Medicare PPO | Admitting: Cardiovascular Disease

## 2020-01-26 DIAGNOSIS — Z7901 Long term (current) use of anticoagulants: Secondary | ICD-10-CM | POA: Diagnosis not present

## 2020-01-26 DIAGNOSIS — I359 Nonrheumatic aortic valve disorder, unspecified: Secondary | ICD-10-CM | POA: Diagnosis not present

## 2020-01-26 LAB — POCT INR: INR: 2.7 (ref 2.0–3.0)

## 2020-02-06 ENCOUNTER — Other Ambulatory Visit: Payer: Self-pay | Admitting: Surgery

## 2020-02-06 DIAGNOSIS — K863 Pseudocyst of pancreas: Secondary | ICD-10-CM | POA: Diagnosis not present

## 2020-02-06 DIAGNOSIS — K8010 Calculus of gallbladder with chronic cholecystitis without obstruction: Secondary | ICD-10-CM | POA: Diagnosis not present

## 2020-02-08 ENCOUNTER — Other Ambulatory Visit: Payer: Self-pay | Admitting: Adult Health

## 2020-02-08 DIAGNOSIS — G4700 Insomnia, unspecified: Secondary | ICD-10-CM

## 2020-02-08 DIAGNOSIS — F331 Major depressive disorder, recurrent, moderate: Secondary | ICD-10-CM

## 2020-02-09 ENCOUNTER — Ambulatory Visit (INDEPENDENT_AMBULATORY_CARE_PROVIDER_SITE_OTHER): Payer: Medicare PPO | Admitting: Pharmacist Clinician (PhC)/ Clinical Pharmacy Specialist

## 2020-02-09 DIAGNOSIS — Z952 Presence of prosthetic heart valve: Secondary | ICD-10-CM | POA: Diagnosis not present

## 2020-02-09 DIAGNOSIS — I359 Nonrheumatic aortic valve disorder, unspecified: Secondary | ICD-10-CM

## 2020-02-09 DIAGNOSIS — Z7901 Long term (current) use of anticoagulants: Secondary | ICD-10-CM

## 2020-02-09 LAB — POCT INR: INR: 2.7 (ref 2.0–3.0)

## 2020-02-15 ENCOUNTER — Ambulatory Visit: Payer: Medicare PPO | Admitting: Family Medicine

## 2020-02-17 ENCOUNTER — Telehealth: Payer: Self-pay | Admitting: *Deleted

## 2020-02-17 NOTE — Telephone Encounter (Signed)
Patient with diagnosis of mechanical aortic valve (On-X) on warfarin for anticoagulation.   Also with history of TIA (2012) and stroke (2014)  Procedure: laproscopic cholecystectomy Date of procedure: TBD  CrCl 74.8 Platelet count 181  Per office protocol, patient can hold warfarin for 5 days prior to procedure.  Patient will need bridging with Lovenox (enoxaparin) around procedure.  Patient has INR followed by [facility] office - patient aware to reach out to [facility] once date is set (tenatively scheduled for March 2022)

## 2020-02-17 NOTE — Telephone Encounter (Signed)
   Primary Cardiologist: [name], MD  Chart reviewed as part of pre-operative protocol coverage. Because of Alexander Barton's past medical history and time since last visit, he will require a follow-up visit in order to better assess preoperative cardiovascular risk.  Pre-op covering staff: - Please schedule appointment and call patient to inform them. If patient already had an upcoming appointment within acceptable timeframe, please add "pre-op clearance" to the appointment notes so provider is aware. - Please contact requesting surgeon's office via preferred method (i.e, phone, fax) to inform them of need for appointment prior to surgery.  If applicable, this message will also be routed to pharmacy pool and/or primary cardiologist for input on holding anticoagulant/antiplatelet agent as requested below so that this information is available to the clearing provider at time of patient's appointment.   [place], [place]  02/17/2020, 3:22 PM

## 2020-02-17 NOTE — Telephone Encounter (Signed)
Pt have appt with Dr [name] on 01/26

## 2020-02-17 NOTE — Telephone Encounter (Signed)
   [facility] Pre-operative Risk Assessment    HEARTCARE STAFF: - Please ensure there is not already an duplicate clearance open for this procedure. - Under Visit Info/Reason for Call, type in Other and utilize the format Clearance MM/DD/YY or Clearance TBD. Do not use dashes or single digits. - If request is for dental extraction, please clarify the # of teeth to be extracted.  Request for surgical clearance:  1. What type of surgery is being performed? Laprascopic Cholecystectomy   2. When is this surgery scheduled? TBD   3. What type of clearance is required (medical clearance vs. Pharmacy clearance to hold med vs. Both)? Both  4. Are there any medications that need to be held prior to surgery and how long? Warfarin   5. Practice name and name of physician performing surgery? [facility]    6. What is the office phone number? [phone]   7.   What is the office fax number? [phone]  8.   Anesthesia type (None, local, MAC, general) ? Samule Ohm, Jeralene Huff 02/17/2020, 1:29 PM  _________________________________________________________________   (provider comments below)

## 2020-02-20 NOTE — Telephone Encounter (Signed)
   Primary Cardiologist: [name], MD  Chart reviewed as part of pre-operative protocol coverage. Because of Hermann Dottavio Lutz's past medical history and time since last visit, he will require a follow-up visit in order to better assess preoperative cardiovascular risk.  This has been scheduled with Dr. [name] for 02/29/2020.   Chart reviewed by pharmacy team. Per office protocol, patient may hold Warfarin 5 days prior to procedure and will require bridging with Lovenox (enoxaparin). Patient is aware to reach out to [facility] when date is set.   Will route to requesting party via [place] fax function so they are aware.  Will route to Dr. [name] so he is aware. Office note on 02/29/20 will include recommendations regarding clearance and be faxed to requesting party.  Will remove from preop pool.   Loel Dubonnet, NP  02/20/2020, 8:16 AM

## 2020-02-23 ENCOUNTER — Ambulatory Visit (INDEPENDENT_AMBULATORY_CARE_PROVIDER_SITE_OTHER): Payer: Medicare PPO | Admitting: Pharmacist

## 2020-02-23 DIAGNOSIS — Z7901 Long term (current) use of anticoagulants: Secondary | ICD-10-CM

## 2020-02-23 DIAGNOSIS — Z8673 Personal history of transient ischemic attack (TIA), and cerebral infarction without residual deficits: Secondary | ICD-10-CM | POA: Diagnosis not present

## 2020-02-23 LAB — POCT INR: INR: 1.8 — AB (ref 2.0–3.0)

## 2020-02-26 NOTE — Progress Notes (Signed)
Alexander Barton Date of Birth: May 18, 1950 Medical Record #[id number]  History of Present Illness: [name] is seen today for followup of valvular heart disease and for pre op clearance for lap choly. He is status post mechanical aortic valve replacement in October 2008 with a #23 mm ON-X mechanical valve conduit with a button Bentall procedure and Hemi arch graft  and is on chronic Coumadin. He had a TIA in January 2012 and was admitted in May 2014 with a right thalamic CVA.  His evaluation in the hospital included an echocardiogram which showed normal valve function. Carotid Dopplers were OK.  He was admitted in May 2018 at [facility] with worsening dyspnea. Seen by our service there. CTA of the chest demonstrated no evidence of PE or significant parenchymal lung disease. There is evidence of prior mechanical AVR and hemi-arch replacement. Coronary artery calcification is also noted as was cholelithiasis.  Myoview study was done and was normal. Echo also done as noted below.   He was admitted in September 2019 at [facility] with acute epigastric, chest, and back pain. Found to have a type B aortic dissection. Managed medically and followed by Dr. Mart Piggs at [facility].   He had  cardiac evaluation including cardiac cath that showed a 60% stenosis in an OM branch otherwise no obstructive disease. Echo showed normally functioning AV prosthesis and normal LV function. PFTs done with COPD.   On 12/02/18 he underwent redo sternotomy with Dr Ysidro Evert. He had 1. Ascending Aortic and total arch replacement with head vessel re-implantation.2. TEVAR w/ intentional L subclavian artery coverage, L carotid-subclavian bypass, and proximal L subclavian artery vascular plug. The postoperative course was remarkable for postop afib/flutter managed with amiodarone and beta blocker- NSR at time of discharge. Also developed postop delirium after TEVAR requiring psych consult. The patient was discharged to home on  12/13/2018. He was readmitted 11/16-11/20 for UTI, meeting sepsis criteria. He was treated with IV abx and transitioned to PO Cefpodoxime. Amiodarone was d/c'd prior to discharge from readmission.  He was seen by Dr Percival Spanish on 01/06/19 for evaluation of weakness. INR was noted to be high and Coumadin was held for 2 days. He was anemic but really unchanged from his post hospital discharge. It was noted that he had Covid 19 infection in early October and never felt fully recovered from that. He did have a follow up CT chest on December 28 2018 and in February 2021 which were stable.   He was admitted 4/25-06/05/19 with abdominal pain, N/V. This was felt to be secondary to gallstones causing pancreatitis and pancreatic pseudocyst with intracystic hemorrhage. He was seen by GI and general surgery. Placed on antibiotics. CTA abdomen pelvis showed slightly smaller complex high density pseudocyst.Maturing and enlarging adjacent cirrhosis with roughly 9 cm maturing pseudocyst between the stomach and the left lobe of the liver, adjacent smaller collection abutting the liver and gallbladder. Planned outpatient follow up with surgery and advanced biliary clinic at [facility] He was anemic with Hgb 8.8. given Feraheme.   In June and July he underwent multiple endoscopic procedures at [facility]. He subsequently developed severe depression. In October he was admitted with dehydration. Since then he has steadily improved. Depression is much better. No significant dyspnea or chest pain. No palpitations. He has lost weight. Now planning elective lap choly with Dr Ninfa Linden.   Current Outpatient Medications on File Prior to Visit  Medication Sig Dispense Refill  . acetaminophen (TYLENOL) 500 MG tablet Take 1,000 mg by mouth  every 6 (six) hours as needed for mild pain.    Marland Kitchen albuterol (VENTOLIN HFA) 108 (90 Base) MCG/ACT inhaler Inhale 2 puffs into the lungs every 4 (four) hours as needed for wheezing or shortness of breath.    . blood  glucose meter kit and supplies KIT Dispense based on patient and insurance preference. Use twice daily. (FOR ICD-10 E11.9). 1 each 0  . Budeson-Glycopyrrol-Formoterol (BREZTRI AEROSPHERE) 160-9-4.8 MCG/ACT AERO Inhale 2 puffs into the lungs in the morning and at bedtime. 10.7 g 3  . glucose blood test strip Use twice daily E11.9 100 each 1  . Investigational - Study Medication Take 1 tablet by mouth See admin instructions. Study name: [id number] Additional study details: cholesterol [facility] [phone]- Take 1 tablet by mouth in the morning    . OLANZapine (ZYPREXA) 5 MG tablet TAKE 1 TABLET BY MOUTH EVERYDAY AT BEDTIME 90 tablet 1  . OVER THE COUNTER MEDICATION Take 1 capsule by mouth See admin instructions. Dotera Digestive Supplement capsule- Take 1 capsule by mouth in the morning    . polyethylene glycol (MIRALAX / GLYCOLAX) 17 g packet Take 17 g by mouth in the morning. [place]    . senna (SENOKOT) 8.6 MG TABS tablet Take 2 tablets by mouth in the morning and at bedtime.    . tamsulosin (FLOMAX) 0.4 MG CAPS capsule TAKE 2 CAPSULES BY MOUTH EVERY DAY 174 capsule 1  . warfarin (COUMADIN) 2 MG tablet TAKE 1 TABLET WITH 5 MG TABLET DAILY AS DIRECTED BY COUMADIN CLINIC 90 tablet 0  . warfarin (COUMADIN) 5 MG tablet TAKE 2 TABLETS BY MOUTH DAILY OR AS DIRECTED 180 tablet 0  . [DISCONTINUED] enoxaparin (LOVENOX) 80 MG/0.8ML injection Inject 0.8 mLs (80 mg total) into the skin every 12 (twelve) hours. (Patient not taking: Reported on 09/22/2019) 11.2 mL 0   No current facility-administered medications on file prior to visit.    Allergies  Allergen Reactions  . Codeine Anaphylaxis  . Gadolinium Derivatives Other (See Comments)    Intense sneezing from MRI contrast  . Hydrocodone Anaphylaxis  . Iodinated Diagnostic Agents Shortness Of Breath and Other (See Comments)    Uncontrollable sneezing and "shortness of breath after prep"- needs sedative the night before and hour and benadryl  before using  . Other Other (See Comments)    Unnamed anesthesia- Patient is in the E.D. possibly due to the effects of it: Delirium, confusion, and sleep deprivation  . Statins Other (See Comments)    Muscle pain  . Antihistamines, Diphenhydramine-Type Other (See Comments)    Unable to urinate. All antihistamines.   . Hydromorphone Other (See Comments)    MUSCLE PAIN  . Lipitor [Atorvastatin Calcium] Other (See Comments)    Muscle pain and a "knot" appears on his right side, near the ribcage  . Seroquel [Quetiapine Fumerate] Swelling and Other (See Comments)    "bad trip" and tongue became swollen    Past Medical History:  Diagnosis Date  . Aortic aneurysm, thoracic ([place])   . Aortic valve disease   . Arthritis   . BPH (benign prostatic hyperplasia)   . COPD (chronic obstructive pulmonary disease) ([place])   . GERD (gastroesophageal reflux disease)   . HTN (hypertension)   . Hypercholesterolemia   . Stroke ([place])   . TIA (transient ischemic attack)     Past Surgical History:  Procedure Laterality Date  . ACHILLES TENDON REPAIR    . AORTIC VALVE REPLACEMENT     #23 On-X  valve conduit  . ASCENDING AORTIC ANEURYSM REPAIR    . BACK SURGERY    . FOOT SURGERY      Social History   Tobacco Use  Smoking Status Never Smoker  Smokeless Tobacco Never Used    Social History   Substance and Sexual Activity  Alcohol Use No  . Alcohol/week: 0.0 standard drinks    Family History  Problem Relation Age of Onset  . Alzheimer's disease Father   . Diabetes Father        age onset DM  . Hypertension Sister   . Hypertension Brother   . Diabetes Mother   . Heart attack Sister   . Diabetes Sister   . Colon cancer Neg Hx     Review of Systems: As noted in history of present illness.  All other systems were reviewed and are negative.  Physical Exam: BP (!) 104/58   Pulse 89   Ht 6' (1.829 m)   Wt 183 lb (83 kg)   SpO2 98%   BMI 24.82 kg/m  GENERAL:  WDWM in NAD HEENT:   PERRL, EOMI, sclera are clear. Oropharynx is clear. NECK:  No jugular venous distention, carotid upstroke brisk and symmetric, no bruits, no thyromegaly or adenopathy LUNGS:  Clear to auscultation bilaterally CHEST:  nontender HEART:  RRR,  PMI not displaced or sustained, loud mechanical AV click,  no S3, no S4: no clicks, no rubs, no murmurs ABD:  Soft, nontender. BS +, no masses or bruits. No hepatomegaly, no splenomegaly EXT:  2 + pulses throughout, 1+ edema, no cyanosis no clubbing SKIN:  Warm and dry.  No rashes NEURO:  Alert and oriented x 3. Cranial nerves II through XII intact. PSYCH:  Cognitively intact   LABORATORY DATA:  Lab Results  Component Value Date   WBC 5.6 12/01/2019   HGB 13.3 12/01/2019   HCT 38.3 (L) 12/01/2019   PLT 181 12/01/2019   GLUCOSE 138 (H) 12/03/2019   CHOL 253 (H) 12/10/2016   TRIG 112.0 12/10/2016   HDL 45.00 12/10/2016   LDLDIRECT 172.0 11/14/2014   LDLCALC 185 (H) 12/10/2016   ALT 11 01/10/2020   AST 18 01/10/2020   NA 137 12/03/2019   K 3.6 12/03/2019   CL 106 12/03/2019   CREATININE 1.03 12/03/2019   BUN 14 12/03/2019   CO2 24 12/03/2019   TSH 1.536 09/21/2019   PSA 0.34 11/14/2014   INR 1.8 (A) 02/23/2020   HGBA1C 6.8 (H) 11/14/2019   MICROALBUR 0.7 11/14/2019     Echo 06/25/16: Study Conclusions  - Procedure narrative: Transthoracic echocardiography. The study   was technically difficult. - Left ventricle: The cavity size was normal. There was mild   concentric hypertrophy. Systolic function was mildly reduced. The   estimated ejection fraction was in the range of 45% to 50%.   Doppler parameters are consistent with abnormal left ventricular   relaxation (grade 1 diastolic dysfunction). - Aortic valve: A mechanical prosthesis was present and functioning   normally. Valve area (Vmax): 1.87 cm^2. - Mitral valve: There was mild regurgitation. - Left atrium: The atrium was moderately dilated. - Right atrium: The atrium was  mildly dilated. - Pulmonary arteries: Systolic pressure was within the normal   range.  Myoview 06/25/16: Pharmacological myocardial perfusion imaging study with no significant  ischemia Normal wall motion, EF estimated at 50% No EKG changes concerning for ischemia at peak stress or in recovery. Low risk scan   Signed, Esmond Plants, MD, Ph.D [facility]  Assessment / Plan: 1. Status post mechanical aortic valve replacement with Bentall procedure 2008. On anticoagulation with Coumadin. Echo in July 2020 showed normal valve function. Valve exam is normal. Followed by pharmacy 2. Type B Aortic dissection. Diagnosed in September 2019 with subsequent aneurysmal formation. Followed by Dr. Mart Piggs at [facility].  S/p  total arch replacement and TEVAR procedure on December 02, 2018. Follow up CT in Feb 2021 was satisfactory. Follow up with Dr Ysidro Evert with CT again next month 3. Hypertension-controlled on no medication.  4. Status post CVA/right thalamic- remote. Continue   Coumadin therapy.  5. Hypercholesterolemia. History of intolerance to lipitor and crestor. Was participating in clinical trial thru primary care.  6. ? COPD- reports seeing pulmonary and was told PFTs did not show COPD but he is better on Breztri.  7. Anemia post op secondary to blood loss.  8. PVCs. Chronic and asymptomatic.  9.  Hemorrhagic pancreatic pseudocysts. Clinically improved. S/p multiple endoscopic procedures. Now being considered for lap choly. He is cleared from a cardiac standpoint. Given history of CVA he should have bridging Lovenox. Pharmacy will manage.   Follow up in 6 months.

## 2020-02-29 ENCOUNTER — Ambulatory Visit: Payer: Medicare PPO | Admitting: Cardiology

## 2020-02-29 ENCOUNTER — Other Ambulatory Visit: Payer: Self-pay

## 2020-02-29 ENCOUNTER — Encounter: Payer: Self-pay | Admitting: Cardiology

## 2020-02-29 VITALS — BP 104/58 | HR 89 | Ht 72.0 in | Wt 183.0 lb

## 2020-02-29 DIAGNOSIS — I10 Essential (primary) hypertension: Secondary | ICD-10-CM | POA: Diagnosis not present

## 2020-02-29 DIAGNOSIS — I359 Nonrheumatic aortic valve disorder, unspecified: Secondary | ICD-10-CM

## 2020-02-29 DIAGNOSIS — I7103 Dissection of thoracoabdominal aorta: Secondary | ICD-10-CM

## 2020-02-29 DIAGNOSIS — Z8673 Personal history of transient ischemic attack (TIA), and cerebral infarction without residual deficits: Secondary | ICD-10-CM | POA: Diagnosis not present

## 2020-02-29 DIAGNOSIS — Z952 Presence of prosthetic heart valve: Secondary | ICD-10-CM | POA: Diagnosis not present

## 2020-03-12 ENCOUNTER — Ambulatory Visit (INDEPENDENT_AMBULATORY_CARE_PROVIDER_SITE_OTHER): Payer: Medicare PPO | Admitting: Pharmacist Clinician (PhC)/ Clinical Pharmacy Specialist

## 2020-03-12 DIAGNOSIS — Z952 Presence of prosthetic heart valve: Secondary | ICD-10-CM | POA: Diagnosis not present

## 2020-03-12 DIAGNOSIS — I359 Nonrheumatic aortic valve disorder, unspecified: Secondary | ICD-10-CM | POA: Diagnosis not present

## 2020-03-12 DIAGNOSIS — Z7901 Long term (current) use of anticoagulants: Secondary | ICD-10-CM | POA: Diagnosis not present

## 2020-03-12 LAB — POCT INR: INR: 2.4 (ref 2.0–3.0)

## 2020-03-13 ENCOUNTER — Other Ambulatory Visit: Payer: Self-pay | Admitting: Surgery

## 2020-03-16 DIAGNOSIS — I7103 Dissection of thoracoabdominal aorta: Secondary | ICD-10-CM | POA: Diagnosis not present

## 2020-03-16 DIAGNOSIS — Z86718 Personal history of other venous thrombosis and embolism: Secondary | ICD-10-CM | POA: Diagnosis not present

## 2020-03-16 DIAGNOSIS — Z952 Presence of prosthetic heart valve: Secondary | ICD-10-CM | POA: Diagnosis not present

## 2020-03-16 DIAGNOSIS — I7100 Dissection of unspecified site of aorta: Secondary | ICD-10-CM | POA: Diagnosis not present

## 2020-03-19 ENCOUNTER — Other Ambulatory Visit: Payer: Self-pay

## 2020-03-19 ENCOUNTER — Ambulatory Visit (INDEPENDENT_AMBULATORY_CARE_PROVIDER_SITE_OTHER): Payer: Medicare PPO | Admitting: Adult Health

## 2020-03-19 ENCOUNTER — Encounter: Payer: Self-pay | Admitting: Adult Health

## 2020-03-19 DIAGNOSIS — F22 Delusional disorders: Secondary | ICD-10-CM

## 2020-03-19 NOTE — Progress Notes (Signed)
Alexander Barton [id number] Mar 20, 1950 70 y.o.  Subjective:   Patient ID:  Alexander Barton is a 70 y.o. (DOB 08/16/1950) male.  Chief Complaint: No chief complaint on file.   HPI Alexander Barton presents to the office today for follow-up of MDD and insomnia.  Accompanied by wife.   Describes mood today as "ok". Pleasant. Mood symptoms - reports decreased depression, anxiety, and irritability. Stating "I'm doing alright". Wife feels like he is a "lot better". Mood remains stable. Doing more things independently Has tolerated Zyprexa and feels it is working well for him. Improved interest and motivation. Taking medications as prescribed.  Energy levels improved. Active, does not have a current exercise routine.  Enjoys some usual interests and activities. Married. Lives with wife of 72 years. 2 children and 3 grandsons. Spending time with family. Appetite adequate. Weight - 182 pounds. Sleeps well most nights. Averages 8 hours.  Focus and concentration difficulties. Completing tasks. Managing aspects of household. 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  Denies SI or HI.  Denies AH or VH.  Previous medication trials: 2008 - Seroquel - allergy. Haldol 52m at bedtime Cogentin.     Mini-Mental   Flowsheet Row Clinical Support from 01/13/2018 in [place] 12/22/2016 in [place] [facility] Total Score (max 30 points ) '30 30 30    ' PHQ2-9   FAllakaketVisit from 01/10/2020 in LAit[place]Visit from 10/03/2019 in [facility]Office Visit from 05/25/2019 in LPricefrom 01/19/2019 in LTac[place]from 10/18/2018 [facility]  PHQ-2 Total Score 0 0 0 0 0  PHQ-9 Total Score -- -- -- -- 8       Review of  Systems:  Review of Systems  Musculoskeletal: Negative for gait problem.  Neurological: Negative for tremors.  Psychiatric/Behavioral:       Please refer to HPI    Medications: I have reviewed the patient's current medications.  Current Outpatient Medications  Medication Sig Dispense Refill  . acetaminophen (TYLENOL) 500 MG tablet Take 1,000 mg by mouth every 6 (six) hours as needed for mild pain.    .Marland Kitchenalbuterol (VENTOLIN HFA) 108 (90 Base) MCG/ACT inhaler Inhale 2 puffs into the lungs every 4 (four) hours as needed for wheezing or shortness of breath.    . blood glucose meter kit and supplies KIT Dispense based on patient and insurance preference. Use twice daily. (FOR ICD-10 E11.9). 1 each 0  . Budeson-Glycopyrrol-Formoterol (BREZTRI AEROSPHERE) 160-9-4.8 MCG/ACT AERO Inhale 2 puffs into the lungs in the morning and at bedtime. 10.7 g 3  . glucose blood test strip Use twice daily E11.9 100 each 1  . Investigational - Study Medication Take 1 tablet by mouth See admin instructions. Study name: [id number] Additional study details: [phone] Take 1 tablet by mouth in the morning    . OLANZapine (ZYPREXA) 5 MG tablet TAKE 1 TABLET BY MOUTH EVERYDAY AT BEDTIME 90 tablet 1  . OVER THE COUNTER MEDICATION Take 1 capsule by mouth See admin instructions. Dotera Digestive Supplement capsule- Take 1 capsule by mouth in the morning    . polyethylene glycol (MIRALAX / GLYCOLAX) 17 g packet Take 17 g by mouth in the morning. [place]   . senna (SENOKOT) 8.6 MG TABS tablet Take  2 tablets by mouth in the morning and at bedtime.    . tamsulosin (FLOMAX) 0.4 MG CAPS capsule TAKE 2 CAPSULES BY MOUTH EVERY DAY 174 capsule 1  . warfarin (COUMADIN) 2 MG tablet TAKE 1 TABLET WITH 5 MG TABLET DAILY AS DIRECTED BY COUMADIN CLINIC 90 tablet 0  . warfarin (COUMADIN) 5 MG tablet TAKE 2 TABLETS BY MOUTH DAILY OR AS DIRECTED 180 tablet 0   No current facility-administered  medications for this visit.    Medication Side Effects: None  Allergies:  Allergies  Allergen Reactions  . Codeine Anaphylaxis  . Gadolinium Derivatives Other (See Comments)    Intense sneezing from MRI contrast  . Hydrocodone Anaphylaxis  . Iodinated Diagnostic Agents Shortness Of Breath and Other (See Comments)    Uncontrollable sneezing and "shortness of breath after prep"- needs sedative the night before and hour and benadryl before using  . Other Other (See Comments)    Unnamed anesthesia- Patient is in the E.D. possibly due to the effects of it: Delirium, confusion, and sleep deprivation  . Statins Other (See Comments)    Muscle pain  . Antihistamines, Diphenhydramine-Type Other (See Comments)    Unable to urinate. All antihistamines.   . Hydromorphone Other (See Comments)    MUSCLE PAIN  . Lipitor [Atorvastatin Calcium] Other (See Comments)    Muscle pain and a "knot" appears on his right side, near the ribcage  . Seroquel [Quetiapine Fumerate] Swelling and Other (See Comments)    "bad trip" and tongue became swollen    Past Medical History:  Diagnosis Date  . Aortic aneurysm, thoracic ([place])   . Aortic valve disease   . Arthritis   . BPH (benign prostatic hyperplasia)   . COPD (chronic obstructive pulmonary disease) ([place])   . GERD (gastroesophageal reflux disease)   . HTN (hypertension)   . Hypercholesterolemia   . Stroke ([place])   . TIA (transient ischemic attack)     Family History  Problem Relation Age of Onset  . Alzheimer's disease Father   . Diabetes Father        age onset DM  . Hypertension Sister   . Hypertension Brother   . Diabetes Mother   . Heart attack Sister   . Diabetes Sister   . Colon cancer Neg Hx     Social History   Socioeconomic History  . Marital status: Married    Spouse name: Not on file  . Number of children: 2  . Years of education: Not on file  . Highest education level: Not on file  Occupational History  . Occupation:  Scientist, clinical (histocompatibility and immunogenetics): [employer]  Tobacco Use  . Smoking status: Never Smoker  . Smokeless tobacco: Never Used  Substance and Sexual Activity  . Alcohol use: No    Alcohol/week: 0.0 standard drinks  . Drug use: No  . Sexual activity: Not Currently  Other Topics Concern  . 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   One son and one daughter   He lives in [place]   One caffeinated beverage daily   08/30/2014   Social Determinants of Health   Financial Resource Strain: Low Risk   . Difficulty of Paying Living Expenses: Not hard at all  Food Insecurity: No Food Insecurity  . Worried About Charity fundraiser in the Last Year: Never true  . Ran  Out of Food in the Last Year: Never true  Transportation Needs: No Transportation Needs  . Lack of Transportation (Medical): No  . Lack of Transportation (Non-Medical): No  Physical Activity: Not on file  Stress: No Stress Concern Present  . Feeling of Stress : Not at all  Social Connections: Unknown  . Frequency of Communication with Friends and Family: Not on file  . Frequency of Social Gatherings with Friends and Family: Not on file  . Attends Religious Services: Not on file  . Active Member of Clubs or Organizations: Not on file  . Attends Archivist Meetings: Not on file  . Marital Status: Married  Human resources officer Violence: Not At Risk  . Fear of Current or Ex-Partner: No  . Emotionally Abused: No  . Physically Abused: No  . Sexually Abused: No    Past Medical History, Surgical history, Social history, and Family history were reviewed and updated as appropriate.   Please see review of systems for further details on the patient's review from today.   Objective:   Physical Exam:  There were no vitals taken for this visit.  Physical Exam Constitutional:      General: He is not in acute distress. Musculoskeletal:         General: No deformity.  Neurological:     Mental Status: He is alert and oriented to person, place, and time.     Coordination: Coordination normal.  Psychiatric:        Attention and Perception: Attention and perception normal. He does not perceive auditory or visual hallucinations.        Mood and Affect: Mood normal. Mood is not anxious or depressed. Affect is not labile, blunt, angry or inappropriate.        Speech: Speech normal.        Behavior: Behavior normal.        Thought Content: Thought content normal. Thought content is not paranoid or delusional. Thought content does not include homicidal or suicidal ideation. Thought content does not include homicidal or suicidal plan.        Cognition and Memory: Cognition and memory normal.        Judgment: Judgment normal.     Comments: Insight intact     Lab Review:     Component Value Date/Time   NA 137 12/03/2019 0714   NA 137 01/10/2019 1228   K 3.6 12/03/2019 0714   CL 106 12/03/2019 0714   CO2 24 12/03/2019 0714   GLUCOSE 138 (H) 12/03/2019 0714   BUN 14 12/03/2019 0714   BUN 14 01/10/2019 1228   CREATININE 1.03 12/03/2019 0714   CALCIUM 8.9 12/03/2019 0714   PROT 6.9 01/10/2020 1254   PROT 6.6 01/10/2019 1228   ALBUMIN 4.2 01/10/2020 1254   ALBUMIN 3.7 (L) 01/10/2019 1228   AST 18 01/10/2020 1254   ALT 11 01/10/2020 1254   ALKPHOS 72 01/10/2020 1254   BILITOT 1.1 01/10/2020 1254   BILITOT 0.8 01/10/2019 1228   GFRNONAA >60 12/03/2019 0714   GFRAA >60 09/22/2019 1831       Component Value Date/Time   WBC 5.6 12/01/2019 1836   RBC 4.28 12/01/2019 1836   HGB 13.3 12/01/2019 1836   HGB 9.3 (L) 01/10/2019 1228   HCT 38.3 (L) 12/01/2019 1836   HCT 28.4 (L) 01/10/2019 1228   PLT 181 12/01/2019 1836   PLT 305 01/10/2019 1228   MCV 89.5 12/01/2019 1836   MCV 90 01/10/2019 1228  MCH 31.1 12/01/2019 1836   MCHC 34.7 12/01/2019 1836   RDW 13.8 12/01/2019 1836   RDW 14.3 01/10/2019 1228   LYMPHSABS 2.0  09/22/2019 1831   MONOABS 0.6 09/22/2019 1831   EOSABS 0.1 09/22/2019 1831   BASOSABS 0.0 09/22/2019 1831    No results found for: POCLITH, LITHIUM   No results found for: PHENYTOIN, PHENOBARB, VALPROATE, CBMZ   .res Assessment: Plan:    Plan:  PDMP reviewed  1. Continue Zyprexa 30m   RTC   Patient advised to contact office with any questions, adverse effects, or acute worsening in signs and symptoms.  Discussed potential metabolic side effects associated with atypical antipsychotics, as well as potential risk for movement side effects. Advised pt to contact office if movement side effects occur.    Diagnoses and all orders for this visit:  Delusional disorder ([facility]     Please see After Visit Summary for patient specific instructions.  Future Appointments  Date Time Provider [place] 04/09/2020 10:00 AM SLeone Haven MD [facility]  01/22/2021  8:30 AM O'Brien-Blaney, Denisa L, LPN [facility]    No orders of the defined types were placed in this encounter.   ------------------------------- mom months

## 2020-03-22 DIAGNOSIS — I7101 Dissection of thoracic aorta: Secondary | ICD-10-CM | POA: Diagnosis not present

## 2020-03-22 DIAGNOSIS — I712 Thoracic aortic aneurysm, without rupture: Secondary | ICD-10-CM | POA: Diagnosis not present

## 2020-03-22 DIAGNOSIS — Z9889 Other specified postprocedural states: Secondary | ICD-10-CM | POA: Diagnosis not present

## 2020-03-22 DIAGNOSIS — Z952 Presence of prosthetic heart valve: Secondary | ICD-10-CM | POA: Diagnosis not present

## 2020-03-26 ENCOUNTER — Telehealth: Payer: Self-pay | Admitting: Cardiology

## 2020-03-26 ENCOUNTER — Ambulatory Visit (INDEPENDENT_AMBULATORY_CARE_PROVIDER_SITE_OTHER): Payer: Medicare PPO | Admitting: Cardiovascular Disease

## 2020-03-26 DIAGNOSIS — Z7901 Long term (current) use of anticoagulants: Secondary | ICD-10-CM | POA: Diagnosis not present

## 2020-03-26 DIAGNOSIS — I359 Nonrheumatic aortic valve disorder, unspecified: Secondary | ICD-10-CM | POA: Diagnosis not present

## 2020-03-26 LAB — POCT INR: INR: 1.4 — AB (ref 2.0–3.0)

## 2020-03-26 NOTE — Telephone Encounter (Signed)
Hardin Negus remote INR called in for an out of Range INR  1.4 for 03/26/2020   [phone]- compy number

## 2020-03-27 NOTE — Telephone Encounter (Signed)
Already been handled in a anticoagulation encounter

## 2020-03-28 ENCOUNTER — Telehealth: Payer: Self-pay | Admitting: Pharmacist Clinician (PhC)/ Clinical Pharmacy Specialist

## 2020-03-28 MED ORDER — ENOXAPARIN SODIUM 80 MG/0.8ML ~~LOC~~ SOLN: 80.0000 mg | Freq: Two times a day (BID) | SUBCUTANEOUS | 0 refills | Status: DC

## 2020-03-28 NOTE — Telephone Encounter (Signed)
Patient scheduled for laparoscopic cholecystectomy.  He previously weighed more and was on 100 mg syringes.  Has 10 still at home and would like to use up.  Weight this am was at 187 lb (up from low at 180).  Will have him use up these syringes, but will call in rx for 80 mg to use once they are gone.  Patient and wife both understanding and appreciate.     March 4: Last dose of warfarin  March 5: No warfarin or enoxaparin (Lovenox).  March 6: Inject enoxaparin 100mg  in the fatty abdominal tissue at least 2 inches from the belly button twice a day about 12 hours apart, 8am and 8pm rotate sites. No warfarin.  March 7: Inject enoxaparin in the fatty tissue every 12 hours, 8am and 8pm. No warfarin.  March 8: Inject enoxaparin in the fatty tissue every 12 hours, 8am and 8pm. No warfarin.  March 9: Inject enoxaparin in the fatty tissue in the morning at 8 am (No PM dose). No warfarin.  March 10: Procedure Day - No enoxaparin - Resume warfarin in the evening or as directed by doctor (take an extra half tablet with usual dose for 2 days then resume normal dose).  March 11: Resume enoxaparin inject in the fatty tissue every 12 hours and take warfarin  March 12: Inject enoxaparin in the fatty tissue every 12 hours and take warfarin  March 13: Inject enoxaparin in the fatty tissue every 12 hours and take warfarin  March 14: Inject enoxaparin in the fatty tissue every 12 hours and take warfarin  March 15: Inject enoxaparin in the fatty tissue every 12 hours and take warfarin  March 16: check INR and call results to [facility].

## 2020-04-05 ENCOUNTER — Other Ambulatory Visit: Payer: Self-pay

## 2020-04-09 ENCOUNTER — Ambulatory Visit: Payer: Medicare PPO | Admitting: Family Medicine

## 2020-04-09 ENCOUNTER — Encounter: Payer: Self-pay | Admitting: Family Medicine

## 2020-04-09 ENCOUNTER — Other Ambulatory Visit (HOSPITAL_COMMUNITY)
Admission: RE | Admit: 2020-04-09 | Discharge: 2020-04-09 | Disposition: A | Discharge status: Home / Self Care | Payer: Medicare PPO | Source: Ambulatory Visit | Attending: Surgery | Admitting: Surgery

## 2020-04-09 ENCOUNTER — Other Ambulatory Visit: Payer: Self-pay

## 2020-04-09 DIAGNOSIS — K851 Biliary acute pancreatitis without necrosis or infection: Secondary | ICD-10-CM

## 2020-04-09 DIAGNOSIS — F419 Anxiety disorder, unspecified: Secondary | ICD-10-CM | POA: Diagnosis not present

## 2020-04-09 DIAGNOSIS — Z01812 Encounter for preprocedural laboratory examination: Secondary | ICD-10-CM | POA: Insufficient documentation

## 2020-04-09 DIAGNOSIS — R198 Other specified symptoms and signs involving the digestive system and abdomen: Secondary | ICD-10-CM | POA: Diagnosis not present

## 2020-04-09 DIAGNOSIS — R0989 Other specified symptoms and signs involving the circulatory and respiratory systems: Secondary | ICD-10-CM | POA: Insufficient documentation

## 2020-04-09 DIAGNOSIS — E041 Nontoxic single thyroid nodule: Secondary | ICD-10-CM

## 2020-04-09 DIAGNOSIS — Z20822 Contact with and (suspected) exposure to covid-19: Secondary | ICD-10-CM | POA: Insufficient documentation

## 2020-04-09 DIAGNOSIS — F32A Depression, unspecified: Secondary | ICD-10-CM | POA: Diagnosis not present

## 2020-04-09 LAB — SARS CORONAVIRUS 2 (TAT 6-24 HRS): SARS Coronavirus 2: NEGATIVE

## 2020-04-09 NOTE — Patient Instructions (Signed)
Nice to see you. Please let us know when you are ready to see ENT for your swallowing. We will get you set up for an ultrasound of your thyroid after your upcoming surgery.

## 2020-04-09 NOTE — Assessment & Plan Note (Signed)
Much improved.  He will continue to follow with psychiatry who is managing his Zyprexa.

## 2020-04-09 NOTE — Assessment & Plan Note (Signed)
Patient is due to have cholecystectomy later this week.  His cardiology team is managing his warfarin and Lovenox bridge.

## 2020-04-09 NOTE — Progress Notes (Signed)
[name], MD Phone: [phone]  Alexander Barton is a 70 y.o. male who presents today for f/u.  Major depressive disorder: Patient and his wife both note this is quite a bit better than it was.  He is following with psychiatry and they placed him on Zyprexa.  His wife notes it was a night and day difference between his December visit and his visit last month.  He denies SI, HI, auditory hallucinations, and visual hallucinations.  Chronic right upper quadrant pain: He is scheduled for a laparoscopic cholecystectomy on 3/10.  He has very little discomfort at this time.  He is bridging with warfarin and Lovenox through his cardiology team.  He notes no vomiting or diarrhea.  Globus sensation: Patient notes the sensation of something stuck in his throat.  He also notes when he lays down he has trouble swallowing.  He notes no regurgitation.  He wonders if certain foods contributed to this as he has tried to cut out dairy and sweets though he does continue to still have issues with this.  Social History   Tobacco Use  Smoking Status Never Smoker  Smokeless Tobacco Never Used    Current Outpatient Medications on File Prior to Visit  Medication Sig Dispense Refill  . acetaminophen (TYLENOL) 500 MG tablet Take 1,000 mg by mouth every 6 (six) hours as needed for mild pain.    Marland Kitchen albuterol (VENTOLIN HFA) 108 (90 Base) MCG/ACT inhaler Inhale 2 puffs into the lungs every 4 (four) hours as needed for wheezing or shortness of breath.    . blood glucose meter kit and supplies KIT Dispense based on patient and insurance preference. Use twice daily. (FOR ICD-10 E11.9). 1 each 0  . Budeson-Glycopyrrol-Formoterol (BREZTRI AEROSPHERE) 160-9-4.8 MCG/ACT AERO Inhale 2 puffs into the lungs in the morning and at bedtime. (Patient taking differently: Inhale 2 puffs into the lungs 2 (two) times daily as needed (shortness of breath).) 10.7 g 3  . enoxaparin (LOVENOX) 80 MG/0.8ML injection Inject 0.8 mLs  (80 mg total) into the skin every 12 (twelve) hours. 8 mL 0  . glucose blood test strip Use twice daily E11.9 100 each 1  . Investigational - Study Medication Take 1 tablet by mouth See admin instructions. Study name: [id number] Additional study details: cholesterol [facility] [phone] Take 1 tablet by mouth in the morning    . OLANZapine (ZYPREXA) 5 MG tablet TAKE 1 TABLET BY MOUTH EVERYDAY AT BEDTIME (Patient taking differently: Take 5 mg by mouth at bedtime.) 90 tablet 1  . OVER THE COUNTER MEDICATION Take 1 capsule by mouth See admin instructions. Dotera Digestive Supplement capsule- Take 1 capsule by mouth in the morning    . polyethylene glycol (MIRALAX / GLYCOLAX) 17 g packet Take 17 g by mouth in the morning.    . senna (SENOKOT) 8.6 MG TABS tablet Take 2 tablets by mouth in the morning and at bedtime.    . tamsulosin (FLOMAX) 0.4 MG CAPS capsule TAKE 2 CAPSULES BY MOUTH EVERY DAY (Patient taking differently: Take 0.4 mg by mouth in the morning and at bedtime.) 174 capsule 1  . warfarin (COUMADIN) 2 MG tablet TAKE 1 TABLET WITH 5 MG TABLET DAILY AS DIRECTED BY COUMADIN CLINIC (Patient taking differently: Take 2 mg by mouth every Tuesday, Thursday, Saturday, and Sunday.) 90 tablet 0  . warfarin (COUMADIN) 5 MG tablet TAKE 2 TABLETS BY MOUTH DAILY OR AS DIRECTED (Patient taking differently: Take 5-10 mg by mouth See admin instructions. Take 10  mg by mouth on Monday, Wednesday and Friday and 5 mg on Tuesday, Thursday, Saturday and Sunday) 180 tablet 0   No current facility-administered medications on file prior to visit.     ROS see history of present illness  Objective  Physical Exam Vitals:   04/09/20 1009  BP: 120/70  Pulse: 79  Temp: 98.2 F (36.8 C)  SpO2: 98%    BP Readings from Last 3 Encounters:  04/09/20 120/70  02/29/20 (!) 104/58  01/20/20 107/62   Wt Readings from Last 3 Encounters:  04/09/20 187 lb 6.4 oz (85 kg)  02/29/20 183 lb (83 kg)  01/20/20  180 lb (81.6 kg)    Physical Exam Constitutional:      General: He is not in acute distress.    Appearance: He is not diaphoretic.  Neck:     Thyroid: No thyroid mass, thyromegaly or thyroid tenderness.  Cardiovascular:     Rate and Rhythm: Normal rate and regular rhythm.     Comments: Auditory valve sound noted Pulmonary:     Effort: Pulmonary effort is normal.     Breath sounds: Normal breath sounds.  Musculoskeletal:        General: No edema.  Skin:    General: Skin is warm and dry.  Neurological:     Mental Status: He is alert.      Assessment/Plan: Please see individual problem list.  Problem List Items Addressed This Visit    Anxiety and depression    Much improved.  He will continue to follow with psychiatry who is managing his Zyprexa.      Gallstone pancreatitis    Patient is due to have cholecystectomy later this week.  His cardiology team is managing his warfarin and Lovenox bridge.      Globus sensation    Discussed referral to ENT for further evaluation though the patient and his wife opted to defer this until after his upcoming surgery.  They will contact us several weeks after his surgery for a referral.  Discussed that this would be less likely related to his thyroid nodule as it is not palpable on exam.      Thyroid nodule    Ultrasound for follow-up on this ordered.      Relevant Orders   US THYROID     This visit occurred during the SARS-CoV-2 public health emergency.  Safety protocols were in place, including screening questions prior to the visit, additional usage of staff PPE, and extensive cleaning of exam room while observing appropriate contact time as indicated for disinfecting solutions.    [name], MD [place]

## 2020-04-09 NOTE — Assessment & Plan Note (Addendum)
Ultrasound for follow-up on this ordered.

## 2020-04-09 NOTE — Assessment & Plan Note (Addendum)
Discussed referral to ENT for further evaluation though the patient and his wife opted to defer this until after his upcoming surgery.  They will contact us several weeks after his surgery for a referral.  Discussed that this would be less likely related to his thyroid nodule as it is not palpable on exam.

## 2020-04-11 ENCOUNTER — Other Ambulatory Visit: Payer: Self-pay | Admitting: Surgery

## 2020-04-11 ENCOUNTER — Encounter (HOSPITAL_COMMUNITY): Payer: Self-pay | Admitting: Surgery

## 2020-04-11 NOTE — Progress Notes (Signed)
Anesthesia Chart Review:  Follows with cardiologist Dr. [name] for history of valvular disease status post mechanical aortic valve replacement with Bentall procedure 2008. On anticoagulation with Coumadin. Echo in July 2020 showed normal valve function.  Last seen 02/29/2020 for preop evaluation.  Per note, "Hemorrhagic pancreatic pseudocysts. Clinically improved. S/p multiple endoscopic procedures. Now being considered for lap choly. He is cleared from a cardiac standpoint. Given history of CVA he should have bridging Lovenox. Pharmacy will manage."  Patient reported Lovenox: Last dose 04/11/20 at 2100 Coumadin: Last dose 04/06/20  History of type B Aortic dissection. Diagnosed in September 2019 with subsequent aneurysmal formation. Followed by Dr. Mart Piggs at [facility].  S/p  total arch replacement and TEVAR procedure on December 02, 2018. Follow up CT in Feb 2021 was satisfactory. Recommended ongoing aortic surveillance with CTA chest, abdomen, & pelvis and echocardiogram. in 1 year.  History of mild chronic obstructive asthma, followed by pulmonology, maintained on Breztri.  Will need DOS labs and eval.   EKG 09/26/2019: Sinus rhythm with Premature atrial complexes.  Rate 83.  CTA 03/16/20 (care everywhere): Impression:   1. Postsurgical changes status post aortic valve replacement, ascending  aortic replacement, great vessel debranching and reimplantation, and  endovascular repair of the dissected aortic arch and descending thoracic  aorta. Stable aneurysmal dilatation of the proximal descending thoracic  aorta measuring up to 5.9 cm. Previously seen contrast opacification within  the false lumen within the descending thoracic aorta is no longer present.   2. Stable appearance of extent of the thoracoabdominal dissection.   TEE 08/27/18 (care everywhere): INTERPRETATION ---------------------------------------------------------------  NORMAL LEFT VENTRICULAR SYSTOLIC FUNCTION WITH MILD LVH   NORMAL LA PRESSURES WITH DIASTOLIC DYSFUNCTION  NORMAL RIGHT VENTRICULAR SYSTOLIC FUNCTION  VALVULAR REGURGITATION: TRIVIAL MR, TRIVIAL PR, MILD TR  PROSTHETIC VALVE(S): MECH PROSTHETIC AoV  INSUFFICIENT TR TO ESTIMATE RVSP.   Compared with prior Echo study on 11/29/2010: SLIGHT INCREASE IN RV SIZE.  ANUERYSMAL BOUNCE OF IAS NOTED.    Wynonia Musty [facility]Anesthesiology Phone [phone] 04/11/2020 3:30 PM

## 2020-04-11 NOTE — Progress Notes (Signed)
PCP: [name], MD Cardiologist: [name], Md  EKG: 09/27/19 CXR: 09/22/19 ECHO: 06/25/16 Stress Test: denies Cardiac Cath: 11/25/06  Fasting Blood Sugar- doesn't check Checks Blood Sugar_0__ times a day (not on meds, diet controlled)  ASA: No Lovenox: Last dose 04/11/20 at 2100 Coumadin: Last dose 04/06/20  OSA/CPAP:  No  Covid test 04/09/20 negative  Anesthesia Review:  Yes, mechanical aorta valve  Patient denies shortness of breath, fever, cough, and chest pain at PAT appointment.  Patient verbalized understanding of instructions provided today at the PAT appointment.  Patient asked to review instructions at home and day of surgery.

## 2020-04-11 NOTE — H&P (Signed)
[place]  Location: [facility] Patient #: [id number] DOB: 1950-11-20 Married / Language: English / Race: White Male   History of Present Illness   The patient is a 70 year old male who presents for evaluation of gall stones.  Chief complaint: Gallstones with a complex history of biliary pancreatitis, necrosis, pseudocyst  He is here for long-term follow-up. He was last seen at [facility] on October 12 and a gastrologist there recommended that we can go ahead and proceed with a cholecystectomy. He has had significant issues with his mental state and behavioral health. Clinically, he has had no abdominal pain and his appetite is improving. His last liver function tests were normal as well as an albumin of 4.5 in early December. He is scheduled to see his cardiologist in a few weeks. Decision is whether or not to proceed with cholecystectomy to prevent ongoing problems given his history  Past Medical History:  Diagnosis Date  . Aortic aneurysm, thoracic ([place])   . Aortic valve disease   . Arthritis   . BPH (benign prostatic hyperplasia)   . COPD (chronic obstructive pulmonary disease) ([place])   . GERD (gastroesophageal reflux disease)   . HTN (hypertension)   . Hypercholesterolemia   . Stroke ([place])   . TIA (transient ischemic attack)          Past Surgical History:  Procedure Laterality Date  . ACHILLES TENDON REPAIR    . AORTIC VALVE REPLACEMENT     #23 On-X valve conduit  . ASCENDING AORTIC ANEURYSM REPAIR    . BACK SURGERY    . FOOT SURGERY      Allergies Codeine Sulfate *ANALGESICS - OPIOID*  Anaphylaxis. HYDROcodone-Acetaminophen *ANALGESICS - OPIOID*  Anaphylaxis. Iodinated Diagnostic Agents  Statins  Lipitor *ANTIHYPERLIPIDEMICS*   Medication History  Breztri Aerosphere (160-9-4.8MCG/ACT Aerosol, Inhalation) Active. Warfarin Sodium (5MG  Tablet, Oral) Active. Tamsulosin HCl (0.4MG  Capsule, Oral)  Active. OLANZapine (5MG  Tablet, Oral) Active. Medications Reconciled  Vitals   Weight: 182.4 lb Height: 73in Body Surface Area: 2.07 m Body Mass Index: 24.06 kg/m  BP: 110/62(Sitting, Left Arm, Standard)     Physical Exam (Christean Silvestri A. Ninfa Linden MD; 02/06/2020 2:43 PM) The physical exam findings are as follows: Note: He appears well today.  He has gained weight and looks much better  Today his abdomen is soft and nontender and there is no hepatomegaly    Assessment & Plan  CHRONIC CHOLECYSTITIS WITH CALCULUS (K80.10) PANCREATIC PSEUDOCYST (K86.3)  Impression: Again I reviewed his notes in the electronic medical records. Laparoscopic cholecystectomy is recommended. Again, with the problems he has had with his mental status, anesthesia, Coumadin, etc. he is still hesitant to proceed with surgery. I again discussed a laparoscopic ostectomy with him and his family. I discussed the risk which includes but is not limited to bleeding, infection, injury to surrounding structures, need to convert to an open procedure, cardiopulmonary issues, etc. He would either need to stop his Coumadin for 5 days preoperatively for be bridged with Lovenox at the discretion of cardiology. He will need cardiac clearance. Again, his bigger issue is his mental state and how he would handle surgery. He is going to see his cardiologist and then determine his next L call me back and let me know.  Addendum:  He has been cleared for surgery and wants to proceed I discussed the procedure in detail.   We discussed the risks and benefits of a laparoscopic cholecystectomy and possible cholangiogram including, but not limited to  bleeding, infection, injury to surrounding structures such as the intestine or liver, bile leak, retained gallstones, need to convert to an open procedure, prolonged diarrhea, blood clots such as  DVT, common bile duct injury, anesthesia risks, and possible need for additional procedures.   We discussed the typical post-operative recovery course.

## 2020-04-11 NOTE — Anesthesia Preprocedure Evaluation (Addendum)
Anesthesia Evaluation  Patient identified by MRN, date of birth, ID band Patient awake    Reviewed: Allergy & Precautions, NPO status , Patient's Chart, lab work & pertinent test results  History of Anesthesia Complications (+) PROLONGED EMERGENCE and Emergence Delirium  Airway Mallampati: I   Neck ROM: Full    Dental  (+) Teeth Intact, Dental Advisory Given   Pulmonary COPD,  COPD inhaler,  04/09/2020 SARS coronavirus NEG   breath sounds clear to auscultation       Cardiovascular hypertension (off of meds now), + Peripheral Vascular Disease (s/p Asc aorta and arch replacement, s/p TEVAR)  + Valvular Problems/Murmurs (S/p AVR)  Rhythm:Regular Rate:Normal + Systolic Click '18 ECHo: EF 37-10%, Grade 1 DD, mechanical AVR with root and asc aorta replacement, mild MR   Neuro/Psych Anxiety Depression TIA   GI/Hepatic Neg liver ROS, GERD  Controlled,  Endo/Other  diabetes (glu 113), Well Controlled  Renal/GU negative Renal ROS     Musculoskeletal  (+) Arthritis ,   Abdominal   Peds  Hematology coumadin   Anesthesia Other Findings   Reproductive/Obstetrics                           Anesthesia Physical Anesthesia Plan  ASA: III  Anesthesia Plan: General   Post-op Pain Management:    Induction: Intravenous  PONV Risk Score and Plan: 2 and Ondansetron and Dexamethasone  Airway Management Planned: Oral ETT  Additional Equipment: None  Intra-op Plan:   Post-operative Plan: Extubation in OR  Informed Consent: I have reviewed the patients History and Physical, chart, labs and discussed the procedure including the risks, benefits and alternatives for the proposed anesthesia with the patient or authorized representative who has indicated his/her understanding and acceptance.     Dental advisory given  Plan Discussed with: CRNA and Surgeon  Anesthesia Plan Comments: (PAT note by Karoline Caldwell,  PA-C: Follows with cardiologist Dr. [name] for history of valvular disease status post mechanical aortic valve replacement with Bentall procedure 2008. On anticoagulation with Coumadin. Echo in July 2020 showed normal valve function.  Last seen 02/29/2020 for preop evaluation.  Per note, "Hemorrhagic pancreatic pseudocysts. Clinically improved. S/p multiple endoscopic procedures. Now being considered for lap choly. He is cleared from a cardiac standpoint. Given history of CVA he should have bridging Lovenox. Pharmacy will manage."  Patient reported Lovenox: Last dose 04/11/20 at 2100 Coumadin: Last dose 04/06/20  History of type B Aortic dissection. Diagnosed in September 2019 with subsequent aneurysmal formation. Followed by Dr. Mart Piggs at [facility].  S/p  total arch replacement and TEVAR procedure on December 02, 2018. Follow up CT in Feb 2021 was satisfactory. Recommended ongoing aortic surveillance with CTA chest, abdomen, & pelvis and echocardiogram. in 1 year.  History of mild chronic obstructive asthma, followed by pulmonology, maintained on Breztri.  Will need DOS labs and eval.   EKG 09/26/2019: Sinus rhythm with Premature atrial complexes.  Rate 83.  CTA 03/16/20 (care everywhere): Impression:   1. Postsurgical changes status post aortic valve replacement, ascending  aortic replacement, great vessel debranching and reimplantation, and  endovascular repair of the dissected aortic arch and descending thoracic  aorta. Stable aneurysmal dilatation of the proximal descending thoracic  aorta measuring up to 5.9 cm. Previously seen contrast opacification within  the false lumen within the descending thoracic aorta is no longer present.   2. Stable appearance of extent of the thoracoabdominal dissection.   TEE 08/27/18 (care  everywhere): INTERPRETATION ---------------------------------------------------------------  NORMAL LEFT VENTRICULAR SYSTOLIC FUNCTION WITH MILD LVH  NORMAL LA  PRESSURES WITH DIASTOLIC DYSFUNCTION  NORMAL RIGHT VENTRICULAR SYSTOLIC FUNCTION  VALVULAR REGURGITATION: TRIVIAL MR, TRIVIAL PR, MILD TR  PROSTHETIC VALVE(S): MECH PROSTHETIC AoV  INSUFFICIENT TR TO ESTIMATE RVSP.   Compared with prior Echo study on 11/29/2010: SLIGHT INCREASE IN RV SIZE.  ANUERYSMAL BOUNCE OF IAS NOTED.   )      Anesthesia Quick Evaluation

## 2020-04-12 ENCOUNTER — Encounter (HOSPITAL_COMMUNITY)
Admission: RE | Disposition: A | Discharge status: Home / Self Care | Payer: Self-pay | Source: Home / Self Care | Attending: Surgery

## 2020-04-12 ENCOUNTER — Ambulatory Visit (HOSPITAL_COMMUNITY): Payer: Medicare PPO | Admitting: Physician Assistant

## 2020-04-12 ENCOUNTER — Other Ambulatory Visit: Payer: Self-pay

## 2020-04-12 ENCOUNTER — Observation Stay (HOSPITAL_COMMUNITY)
Admission: RE | Admit: 2020-04-12 | Discharge: 2020-04-12 | Disposition: A | Discharge status: Home / Self Care | Payer: Medicare PPO | Attending: Surgery | Admitting: Surgery

## 2020-04-12 ENCOUNTER — Encounter (HOSPITAL_COMMUNITY): Payer: Self-pay | Admitting: Surgery

## 2020-04-12 DIAGNOSIS — I10 Essential (primary) hypertension: Secondary | ICD-10-CM | POA: Diagnosis not present

## 2020-04-12 DIAGNOSIS — Z7901 Long term (current) use of anticoagulants: Secondary | ICD-10-CM | POA: Diagnosis not present

## 2020-04-12 DIAGNOSIS — K863 Pseudocyst of pancreas: Secondary | ICD-10-CM | POA: Diagnosis not present

## 2020-04-12 DIAGNOSIS — E119 Type 2 diabetes mellitus without complications: Secondary | ICD-10-CM | POA: Diagnosis not present

## 2020-04-12 DIAGNOSIS — J449 Chronic obstructive pulmonary disease, unspecified: Secondary | ICD-10-CM | POA: Diagnosis not present

## 2020-04-12 DIAGNOSIS — K8020 Calculus of gallbladder without cholecystitis without obstruction: Secondary | ICD-10-CM | POA: Diagnosis present

## 2020-04-12 DIAGNOSIS — K8010 Calculus of gallbladder with chronic cholecystitis without obstruction: Secondary | ICD-10-CM | POA: Diagnosis not present

## 2020-04-12 DIAGNOSIS — Z8673 Personal history of transient ischemic attack (TIA), and cerebral infarction without residual deficits: Secondary | ICD-10-CM | POA: Diagnosis not present

## 2020-04-12 DIAGNOSIS — Z9049 Acquired absence of other specified parts of digestive tract: Secondary | ICD-10-CM

## 2020-04-12 DIAGNOSIS — Z79899 Other long term (current) drug therapy: Secondary | ICD-10-CM | POA: Diagnosis not present

## 2020-04-12 HISTORY — PX: CHOLECYSTECTOMY: SHX55

## 2020-04-12 HISTORY — DX: Other complications of anesthesia, initial encounter: T88.59XA

## 2020-04-12 LAB — BASIC METABOLIC PANEL
Anion gap: 7 (ref 5–15)
BUN: 24 mg/dL — ABNORMAL HIGH (ref 8–23)
CO2: 26 mmol/L (ref 22–32)
Calcium: 9.1 mg/dL (ref 8.9–10.3)
Chloride: 105 mmol/L (ref 98–111)
Creatinine, Ser: 1.28 mg/dL — ABNORMAL HIGH (ref 0.61–1.24)
GFR, Estimated: 60 mL/min — ABNORMAL LOW (ref >60)
Glucose, Bld: 130 mg/dL — ABNORMAL HIGH (ref 70–99)
Potassium: 4.3 mmol/L (ref 3.5–5.1)
Sodium: 138 mmol/L (ref 135–145)

## 2020-04-12 LAB — CBC
HCT: 38.8 % — ABNORMAL LOW (ref 39.0–52.0)
Hemoglobin: 12.8 g/dL — ABNORMAL LOW (ref 13.0–17.0)
MCH: 30.8 pg (ref 26.0–34.0)
MCHC: 33 g/dL (ref 30.0–36.0)
MCV: 93.5 fL (ref 80.0–100.0)
Platelets: 173 10*3/uL (ref 150–400)
RBC: 4.15 MIL/uL — ABNORMAL LOW (ref 4.22–5.81)
RDW: 13.3 % (ref 11.5–15.5)
WBC: 4.6 10*3/uL (ref 4.0–10.5)
nRBC: 0 % (ref 0.0–0.2)

## 2020-04-12 LAB — PROTIME-INR
INR: 1 (ref 0.8–1.2)
Prothrombin Time: 13.1 seconds (ref 11.4–15.2)

## 2020-04-12 LAB — GLUCOSE, CAPILLARY: Glucose-Capillary: 116 mg/dL — ABNORMAL HIGH (ref 70–99)

## 2020-04-12 SURGERY — LAPAROSCOPIC CHOLECYSTECTOMY WITH INTRAOPERATIVE CHOLANGIOGRAM
Anesthesia: General

## 2020-04-12 MED ORDER — FENTANYL CITRATE (PF) 100 MCG/2ML IJ SOLN: 25.0000 ug | INTRAMUSCULAR | Status: DC | PRN

## 2020-04-12 MED ORDER — CHLORHEXIDINE GLUCONATE CLOTH 2 % EX PADS: 6.0000 | MEDICATED_PAD | Freq: Once | CUTANEOUS | Status: DC

## 2020-04-12 MED ORDER — TRAMADOL HCL 50 MG PO TABS: 50.0000 mg | ORAL_TABLET | Freq: Four times a day (QID) | ORAL | Status: DC | PRN

## 2020-04-12 MED ORDER — ALBUTEROL SULFATE HFA 108 (90 BASE) MCG/ACT IN AERS: 2.0000 | INHALATION_SPRAY | RESPIRATORY_TRACT | Status: DC | PRN

## 2020-04-12 MED ORDER — CHLORHEXIDINE GLUCONATE 0.12 % MT SOLN
15.0000 mL | Freq: Once | OROMUCOSAL | Status: AC
  Administered 2020-04-12: 15 mL via OROMUCOSAL
  Filled 2020-04-12: qty 15

## 2020-04-12 MED ORDER — MIDAZOLAM HCL 2 MG/2ML IJ SOLN
0.5000 mg | Freq: Once | INTRAMUSCULAR | Status: DC | PRN
Start: 2020-04-12 — End: 2020-04-12

## 2020-04-12 MED ORDER — PROMETHAZINE HCL 25 MG/ML IJ SOLN
6.2500 mg | INTRAMUSCULAR | Status: DC | PRN
Start: 2020-04-12 — End: 2020-04-12

## 2020-04-12 MED ORDER — ESMOLOL HCL 100 MG/10ML IV SOLN
INTRAVENOUS | Status: AC
  Filled 2020-04-12: qty 10

## 2020-04-12 MED ORDER — PROPOFOL 10 MG/ML IV BOLUS
INTRAVENOUS | Status: AC
  Filled 2020-04-12: qty 20

## 2020-04-12 MED ORDER — ROCURONIUM BROMIDE 10 MG/ML (PF) SYRINGE
PREFILLED_SYRINGE | INTRAVENOUS | Status: AC
  Filled 2020-04-12: qty 10

## 2020-04-12 MED ORDER — SUGAMMADEX SODIUM 200 MG/2ML IV SOLN
INTRAVENOUS | Status: DC | PRN
  Administered 2020-04-12: 170 mg via INTRAVENOUS

## 2020-04-12 MED ORDER — LIDOCAINE 2% (20 MG/ML) 5 ML SYRINGE
INTRAMUSCULAR | Status: DC | PRN
  Administered 2020-04-12: 20 mg via INTRAVENOUS

## 2020-04-12 MED ORDER — FENTANYL CITRATE (PF) 250 MCG/5ML IJ SOLN
INTRAMUSCULAR | Status: DC | PRN
  Administered 2020-04-12: 50 ug via INTRAVENOUS
  Administered 2020-04-12: 100 ug via INTRAVENOUS

## 2020-04-12 MED ORDER — OLANZAPINE 5 MG PO TABS: 5.0000 mg | ORAL_TABLET | Freq: Every day | ORAL | Status: DC

## 2020-04-12 MED ORDER — ENSURE PRE-SURGERY PO LIQD: 296.0000 mL | Freq: Once | ORAL | Status: DC

## 2020-04-12 MED ORDER — TRAMADOL HCL 50 MG PO TABS: 50.0000 mg | ORAL_TABLET | Freq: Four times a day (QID) | ORAL | 1 refills | Status: DC | PRN

## 2020-04-12 MED ORDER — ONDANSETRON HCL 4 MG/2ML IJ SOLN: 4.0000 mg | Freq: Four times a day (QID) | INTRAMUSCULAR | Status: DC | PRN

## 2020-04-12 MED ORDER — ESMOLOL HCL 100 MG/10ML IV SOLN
INTRAVENOUS | Status: DC | PRN
  Administered 2020-04-12 (×2): 20 mg via INTRAVENOUS

## 2020-04-12 MED ORDER — ACETAMINOPHEN 325 MG PO TABS: 650.0000 mg | ORAL_TABLET | Freq: Four times a day (QID) | ORAL | Status: DC | PRN

## 2020-04-12 MED ORDER — PHENYLEPHRINE 40 MCG/ML (10ML) SYRINGE FOR IV PUSH (FOR BLOOD PRESSURE SUPPORT)
PREFILLED_SYRINGE | INTRAVENOUS | Status: AC
  Filled 2020-04-12: qty 10

## 2020-04-12 MED ORDER — LACTATED RINGERS IV SOLN: INTRAVENOUS | Status: DC

## 2020-04-12 MED ORDER — BUDESON-GLYCOPYRROL-FORMOTEROL 160-9-4.8 MCG/ACT IN AERO: 2.0000 | INHALATION_SPRAY | Freq: Two times a day (BID) | RESPIRATORY_TRACT | Status: DC | PRN

## 2020-04-12 MED ORDER — PROPOFOL 500 MG/50ML IV EMUL
INTRAVENOUS | Status: DC | PRN
  Administered 2020-04-12: 100 ug/kg/min via INTRAVENOUS

## 2020-04-12 MED ORDER — MEPERIDINE HCL 25 MG/ML IJ SOLN: 6.2500 mg | INTRAMUSCULAR | Status: DC | PRN

## 2020-04-12 MED ORDER — DEXAMETHASONE SODIUM PHOSPHATE 10 MG/ML IJ SOLN
INTRAMUSCULAR | Status: DC | PRN
  Administered 2020-04-12: 4 mg via INTRAVENOUS

## 2020-04-12 MED ORDER — FENTANYL CITRATE (PF) 250 MCG/5ML IJ SOLN
INTRAMUSCULAR | Status: AC
  Filled 2020-04-12: qty 5

## 2020-04-12 MED ORDER — ACETAMINOPHEN 650 MG RE SUPP: 650.0000 mg | Freq: Four times a day (QID) | RECTAL | Status: DC | PRN

## 2020-04-12 MED ORDER — BUPIVACAINE-EPINEPHRINE 0.25% -1:200000 IJ SOLN
INTRAMUSCULAR | Status: DC | PRN
  Administered 2020-04-12: 20 mL

## 2020-04-12 MED ORDER — ENOXAPARIN SODIUM 80 MG/0.8ML ~~LOC~~ SOLN: 80.0000 mg | Freq: Two times a day (BID) | SUBCUTANEOUS | Status: DC

## 2020-04-12 MED ORDER — ROCURONIUM BROMIDE 10 MG/ML (PF) SYRINGE
PREFILLED_SYRINGE | INTRAVENOUS | Status: DC | PRN
  Administered 2020-04-12: 50 mg via INTRAVENOUS

## 2020-04-12 MED ORDER — CEFAZOLIN SODIUM-DEXTROSE 2-4 GM/100ML-% IV SOLN
2.0000 g | INTRAVENOUS | Status: AC
  Administered 2020-04-12: 2 g via INTRAVENOUS
  Filled 2020-04-12: qty 100

## 2020-04-12 MED ORDER — POTASSIUM CHLORIDE IN NACL 20-0.9 MEQ/L-% IV SOLN: INTRAVENOUS | Status: DC

## 2020-04-12 MED ORDER — OXYCODONE HCL 5 MG/5ML PO SOLN: 5.0000 mg | Freq: Once | ORAL | Status: DC | PRN

## 2020-04-12 MED ORDER — ONDANSETRON 4 MG PO TBDP: 4.0000 mg | ORAL_TABLET | Freq: Four times a day (QID) | ORAL | Status: DC | PRN

## 2020-04-12 MED ORDER — 0.9 % SODIUM CHLORIDE (POUR BTL) OPTIME
TOPICAL | Status: DC | PRN
  Administered 2020-04-12: 1000 mL

## 2020-04-12 MED ORDER — ACETAMINOPHEN 500 MG PO TABS
1000.0000 mg | ORAL_TABLET | ORAL | Status: AC
  Administered 2020-04-12: 1000 mg via ORAL
  Filled 2020-04-12: qty 2

## 2020-04-12 MED ORDER — OXYCODONE HCL 5 MG PO TABS: 5.0000 mg | ORAL_TABLET | Freq: Once | ORAL | Status: DC | PRN

## 2020-04-12 MED ORDER — INSULIN ASPART 100 UNIT/ML ~~LOC~~ SOLN: 0.0000 [IU] | Freq: Three times a day (TID) | SUBCUTANEOUS | Status: DC

## 2020-04-12 MED ORDER — SODIUM CHLORIDE 0.9 % IR SOLN
Status: DC | PRN
  Administered 2020-04-12: 1

## 2020-04-12 MED ORDER — LIDOCAINE 2% (20 MG/ML) 5 ML SYRINGE
INTRAMUSCULAR | Status: AC
  Filled 2020-04-12: qty 5

## 2020-04-12 MED ORDER — TAMSULOSIN HCL 0.4 MG PO CAPS: 0.4000 mg | ORAL_CAPSULE | Freq: Two times a day (BID) | ORAL | Status: DC

## 2020-04-12 MED ORDER — DEXAMETHASONE SODIUM PHOSPHATE 10 MG/ML IJ SOLN
INTRAMUSCULAR | Status: AC
  Filled 2020-04-12: qty 1

## 2020-04-12 MED ORDER — PROPOFOL 10 MG/ML IV BOLUS
INTRAVENOUS | Status: DC | PRN
  Administered 2020-04-12: 20 mg via INTRAVENOUS
  Administered 2020-04-12: 100 mg via INTRAVENOUS
  Administered 2020-04-12: 30 mg via INTRAVENOUS

## 2020-04-12 MED ORDER — PHENYLEPHRINE HCL-NACL 10-0.9 MG/250ML-% IV SOLN
INTRAVENOUS | Status: DC | PRN
  Administered 2020-04-12: 20 ug/min via INTRAVENOUS

## 2020-04-12 MED ORDER — BUPIVACAINE HCL (PF) 0.25 % IJ SOLN
INTRAMUSCULAR | Status: AC
  Filled 2020-04-12: qty 30

## 2020-04-12 MED ORDER — INSULIN ASPART 100 UNIT/ML ~~LOC~~ SOLN: 0.0000 [IU] | Freq: Every day | SUBCUTANEOUS | Status: DC

## 2020-04-12 MED ORDER — ONDANSETRON HCL 4 MG/2ML IJ SOLN
INTRAMUSCULAR | Status: AC
  Filled 2020-04-12: qty 2

## 2020-04-12 MED ORDER — ONDANSETRON HCL 4 MG/2ML IJ SOLN
INTRAMUSCULAR | Status: DC | PRN
  Administered 2020-04-12: 4 mg via INTRAVENOUS

## 2020-04-12 MED ORDER — ORAL CARE MOUTH RINSE: 15.0000 mL | Freq: Once | OROMUCOSAL | Status: AC

## 2020-04-12 SURGICAL SUPPLY — 36 items
APPLIER CLIP 5 13 M/L LIGAMAX5 (MISCELLANEOUS) ×2
BLADE CLIPPER SURG (BLADE) ×2 IMPLANT
CANISTER SUCT 3000ML PPV (MISCELLANEOUS) ×2 IMPLANT
CHLORAPREP W/TINT 26 (MISCELLANEOUS) ×2 IMPLANT
CLIP APPLIE 5 13 M/L LIGAMAX5 (MISCELLANEOUS) ×1 IMPLANT
COVER MAYO STAND STRL (DRAPES) IMPLANT
COVER SURGICAL LIGHT HANDLE (MISCELLANEOUS) ×2 IMPLANT
COVER WAND RF STERILE (DRAPES) IMPLANT
DERMABOND ADVANCED (GAUZE/BANDAGES/DRESSINGS) ×1
DERMABOND ADVANCED .7 DNX12 (GAUZE/BANDAGES/DRESSINGS) ×1 IMPLANT
DRAPE C-ARM 42X120 X-RAY (DRAPES) IMPLANT
ELECT REM PT RETURN 9FT ADLT (ELECTROSURGICAL) ×2
ELECTRODE REM PT RTRN 9FT ADLT (ELECTROSURGICAL) ×1 IMPLANT
GLOVE SURG SIGNA 7.5 PF LTX (GLOVE) ×2 IMPLANT
GOWN STRL REUS W/ TWL LRG LVL3 (GOWN DISPOSABLE) ×2 IMPLANT
GOWN STRL REUS W/ TWL XL LVL3 (GOWN DISPOSABLE) ×1 IMPLANT
GOWN STRL REUS W/TWL LRG LVL3 (GOWN DISPOSABLE) ×4
GOWN STRL REUS W/TWL XL LVL3 (GOWN DISPOSABLE) ×1
KIT BASIN OR (CUSTOM PROCEDURE TRAY) ×2 IMPLANT
KIT TURNOVER KIT B (KITS) ×2 IMPLANT
NS IRRIG 1000ML POUR BTL (IV SOLUTION) ×2 IMPLANT
PAD ARMBOARD 7.5X6 YLW CONV (MISCELLANEOUS) ×2 IMPLANT
POUCH SPECIMEN RETRIEVAL 10MM (ENDOMECHANICALS) ×2 IMPLANT
SCISSORS LAP 5X35 DISP (ENDOMECHANICALS) ×2 IMPLANT
SET CHOLANGIOGRAPH 5 50 .035 (SET/KITS/TRAYS/PACK) IMPLANT
SET IRRIG TUBING LAPAROSCOPIC (IRRIGATION / IRRIGATOR) ×2 IMPLANT
SET TUBE SMOKE EVAC HIGH FLOW (TUBING) ×2 IMPLANT
SLEEVE ENDOPATH XCEL 5M (ENDOMECHANICALS) ×4 IMPLANT
SPECIMEN JAR SMALL (MISCELLANEOUS) ×2 IMPLANT
SUT MNCRL AB 4-0 PS2 18 (SUTURE) ×2 IMPLANT
TOWEL GREEN STERILE (TOWEL DISPOSABLE) IMPLANT
TOWEL GREEN STERILE FF (TOWEL DISPOSABLE) ×2 IMPLANT
TRAY LAPAROSCOPIC MC (CUSTOM PROCEDURE TRAY) ×2 IMPLANT
TROCAR XCEL BLUNT TIP 100MML (ENDOMECHANICALS) ×2 IMPLANT
TROCAR XCEL NON-BLD 5MMX100MML (ENDOMECHANICALS) ×2 IMPLANT
WATER STERILE IRR 1000ML POUR (IV SOLUTION) ×2 IMPLANT

## 2020-04-12 NOTE — Anesthesia Postprocedure Evaluation (Signed)
Anesthesia Post Note  Patient: Alexander Barton  Procedure(s) Performed: LAPAROSCOPIC CHOLECYSTECTOMY (N/A )     Patient location during evaluation: PACU Anesthesia Type: General Level of consciousness: awake and alert, patient cooperative and oriented Pain management: pain level controlled Vital Signs Assessment: post-procedure vital signs reviewed and stable Respiratory status: spontaneous breathing, nonlabored ventilation and respiratory function stable Cardiovascular status: blood pressure returned to baseline and stable Postop Assessment: no apparent nausea or vomiting Anesthetic complications: no   No complications documented.  Last Vitals:  Vitals:   04/12/20 0840 04/12/20 0900  BP: (!) 148/69   Pulse: 69 77  Resp: 13 19  Temp:    SpO2: 99% 100%    Last Pain:  Vitals:   04/12/20 0900  TempSrc:   PainSc: Asleep                 Mareli Antunes,E. Vianka Ertel

## 2020-04-12 NOTE — Discharge Summary (Addendum)
Patient had an uneventful lap chole. He did so well in the PACU that the decision was made to discharge home from the PACU instead of admitting the patient  Final diagnosis:  Symptomatic cholelithiasis

## 2020-04-12 NOTE — Progress Notes (Signed)
Moved to bay 16 / wife in room with pt

## 2020-04-12 NOTE — Progress Notes (Signed)
Patient ID: Alexander Barton, male   DOB: 07-17-1950, 70 y.o.   MRN: [id number]   He is doing very well in the recovery room. He is awake and alert and in no pain His wife is here. Given his dementia issues and potential to sun-down, the patient, his wife, anesthesia, and I agree that he can be discharged home from the PACU in stead of being placed in observation.  He will start his coumadin back tonight and his lovenox bridge in the morning

## 2020-04-12 NOTE — Transfer of Care (Signed)
Immediate Anesthesia Transfer of Care Note  Patient: Quinterius Gaida Blue  Procedure(s) Performed: LAPAROSCOPIC CHOLECYSTECTOMY (N/A )  Patient Location: PACU  Anesthesia Type:General  Level of Consciousness: awake, drowsy and patient cooperative  Airway & Oxygen Therapy: Patient Spontanous Breathing and Patient connected to face mask oxygen  Post-op Assessment: Report given to RN and Post -op Vital signs reviewed and stable  Post vital signs: Reviewed and stable  Last Vitals:  Vitals Value Taken Time  BP 128/68 04/12/20 0825  Temp    Pulse 70 04/12/20 0830  Resp 16 04/12/20 0830  SpO2 99 % 04/12/20 0830  Vitals shown include unvalidated device data.  Last Pain:  Vitals:   04/12/20 0640  TempSrc:   PainSc: 0-No pain         Complications: No complications documented.

## 2020-04-12 NOTE — Progress Notes (Signed)
Patient last dose of Lovenox was injected last night @2000  at home. Dr. Ninfa Linden made aware. Per surgeon okay to proceed. Surgeon in room with patient and he relayed information to patient.

## 2020-04-12 NOTE — Anesthesia Procedure Notes (Signed)
Procedure Name: Intubation Date/Time: 04/12/2020 7:33 AM Performed by: Renato Shin, CRNA Pre-anesthesia Checklist: Patient identified, Emergency Drugs available, Suction available and Patient being monitored Patient Re-evaluated:Patient Re-evaluated prior to induction Oxygen Delivery Method: Circle system utilized Preoxygenation: Pre-oxygenation with 100% oxygen Induction Type: IV induction Ventilation: Mask ventilation without difficulty Laryngoscope Size: Miller and 2 Grade View: Grade I Tube type: Oral Tube size: 7.5 mm Number of attempts: 1 Airway Equipment and Method: Stylet and Oral airway Placement Confirmation: ETT inserted through vocal cords under direct vision,  positive ETCO2 and breath sounds checked- equal and bilateral Secured at: 22 cm Tube secured with: Tape Dental Injury: Teeth and Oropharynx as per pre-operative assessment

## 2020-04-12 NOTE — Discharge Instructions (Signed)
[facility] ______[facility] LAPAROSCOPIC SURGERY: POST OP INSTRUCTIONS Always review your discharge instruction sheet given to you by the facility where your surgery was performed. IF YOU HAVE DISABILITY OR FAMILY LEAVE FORMS, YOU MUST BRING THEM TO THE OFFICE FOR PROCESSING.   DO NOT GIVE THEM TO YOUR DOCTOR.  1. A prescription for pain medication may be given to you upon discharge.  Take your pain medication as prescribed, if needed.  If narcotic pain medicine is not needed, then you may take acetaminophen (Tylenol) or ibuprofen (Advil) as needed. 2. Take your usually prescribed medications unless otherwise directed. 3. If you need a refill on your pain medication, please contact your pharmacy.  They will contact our office to request authorization. Prescriptions will not be filled after 5pm or on week-ends. 4. You should follow a light diet the first few days after arrival home, such as soup and crackers, etc.  Be sure to include lots of fluids daily. 5. Most patients will experience some swelling and bruising in the area of the incisions.  Ice packs will help.  Swelling and bruising can take several days to resolve.  6. It is common to experience some constipation if taking pain medication after surgery.  Increasing fluid intake and taking a stool softener (such as Colace) will usually help or prevent this problem from occurring.  A mild laxative (Milk of Magnesia or Miralax) should be taken according to package instructions if there are no bowel movements after 48 hours. 7. Unless discharge instructions indicate otherwise, you may remove your bandages 24-48 hours after surgery, and you may shower at that time.  You may have steri-strips (small skin tapes) in place directly over the incision.  These strips should be left on the skin for 7-10 days.  If your surgeon used skin glue on the incision, you may shower in 24 hours.  The glue will flake off over the next 2-3 weeks.  Any sutures or  staples will be removed at the office during your follow-up visit. 8. ACTIVITIES:  You may resume regular (light) daily activities beginning the next day--such as daily self-care, walking, climbing stairs--gradually increasing activities as tolerated.  You may have sexual intercourse when it is comfortable.  Refrain from any heavy lifting or straining until approved by your doctor. a. You may drive when you are no longer taking prescription pain medication, you can comfortably wear a seatbelt, and you can safely maneuver your car and apply brakes. b. RETURN TO WORK:  __________________________________________________________ 9. You should see your doctor in the office for a follow-up appointment approximately 2-3 weeks after your surgery.  Make sure that you call for this appointment within a day or two after you arrive home to insure a convenient appointment time. 10. OTHER INSTRUCTIONS:OK TO SHOWER STARTING TOMORROW 11. EXTRA STRENGTH TYLENOL, ICE PACK, HEATING PAD FOR PAIN 12. NO LIFTING OVER 15 TO 20 POUNDS FOR 2 WEEKS  HOLD LOVENOX UNTIL TOMORROW MORNING (3/11) START COUMADIN BACK THIS EVENING __________________________________________________________________________________________________________________________ __________________________________________________________________________________________________________________________ WHEN TO CALL YOUR DOCTOR: 1. Fever over 101.0 2. Inability to urinate 3. Continued bleeding from incision. 4. Increased pain, redness, or drainage from the incision. 5. Increasing abdominal pain  The clinic staff is available to answer your questions during regular business hours.  Please don't hesitate to call and ask to speak to one of the nurses for clinical concerns.  If you have a medical emergency, go to the nearest emergency room or call 911.  A Psychologist, sport and exercise from [facility] is always on call at the hospital. [address] [place],  [place], [place]  [address] ? [address] [place], [place], [place]   [address] [phone] ? [phone] ? FAX [phone] Web site: [email or web address]

## 2020-04-12 NOTE — Interval H&P Note (Signed)
History and Physical Interval Note: no change in H and P  04/12/2020 7:00 AM  Alexander Barton  has presented today for surgery, with the diagnosis of Horace.  The various methods of treatment have been discussed with the patient and family. After consideration of risks, benefits and other options for treatment, the patient has consented to  Procedure(s) with comments: [place] CHOLANGIOGRAM (N/A) - ROOM 1 STARTING AT 07:30AM FOR 60 MIN as a surgical intervention.  The patient's history has been reviewed, patient examined, no change in status, stable for surgery.  I have reviewed the patient's chart and labs.  Questions were answered to the patient's satisfaction.     Coralie Keens

## 2020-04-12 NOTE — Op Note (Signed)
LAPAROSCOPIC CHOLECYSTECTOMY  Procedure Note  Alexander Barton 04/12/2020   Pre-op Diagnosis: CHRONIC CHOLECYSITIS WITH GALLSTONES     Post-op Diagnosis: same  Procedure(s): LAPAROSCOPIC CHOLECYSTECTOMY  Surgeon(s): Coralie Keens, MD  Anesthesia: General  Staff:  Circulator: Cyd Silence, RN Scrub Person: Lois Huxley; Truddie Coco, CST  Estimated Blood Loss: Minimal               Specimens: sent to path  Indications: This is a 70 year old gentleman who presented last year with a hemorrhagic pseudocyst at the distal part of his pancreas.  It was felt to be that the gallstones were because of this.  He ended up going to [facility] where he had a endoscopic cyst gastrostomy as well as a necrosectomy endoscopically.  He also had stenting of his pancreatic duct which is now since been removed.  He is on chronic Coumadin and is currently being bridged with Lovenox for the surgery.  The decision has been made to proceed with a cholecystectomy  Findings: The patient was found to have a chronically inflamed gallbladder.  There was a stone in the gallbladder neck and a foreshortened cystic duct so the decision was made to forego cholangiogram.  Procedure: The patient was brought to operating identifies correct patient.  He was placed on the operating table and general anesthesia was induced.  His abdomen was prepped and draped in the usual sterile fashion.  I made a small vertical incision below the umbilicus.  I carried this down to the fascia which was then opened with a scalpel.  Hemostat was then used to pass into the peritoneal cavity under direct vision.  A 0 Vicryl pursing suture was placed around the fascial opening.  The [facility] port was placed through the opening and insufflation of the abdomen was begun.  I placed a 5 mm trocar in the patient's epigastrium and 2 more in the right upper quadrant under direct vision.  The gallbladder was identified and  found to be thick-walled and chronically inflamed.  I was able to grasp it retracted the liver.  Adhesions of omentum to the gallbladder were then taken down bluntly.  I was then able to slowly dissect out the base of the gallbladder.  Identified the cystic duct and achieved a critical window around it.  There was a stone in the gallbladder neck.  The cystic duct appeared quite foreshortened so decision was made to forego cholangiogram.  I was able to remove the stone back slightly into the gallbladder and then clipped the cystic duct 3 times proximally once distally and transected.  The cystic artery was then clipped proximally distally and transected as well.  The gallbladder was inserted dissected free from liver bed with electrocautery.  Once it was free from the liver bed hemostasis was achieved with the cautery.  The gallbladder was then placed in an Endosac and removed through the incision at the umbilicus.  The abdomen and the right upper quadrant were then copes irrigated normal saline.  Again hemostasis appeared to be achieved.  The [facility] port was removed and the 0 Vicryl was tied in place closing the fascial defect under direct vision.  I placed another 0 Vicryl suture at the umbilicus as well.  All ports were removed under direct vision and the abdomen was deflated.  All incisions were anesthetized with Marcaine and closed with 4-0 Monocryl sutures.  Dermabond was then applied.  The patient tolerated the procedure well.  All the counts were correct at  the end of the procedure.  Patient was then extubated in the operating room and taken in stable addition to the recovery room.          Coralie Keens   Date: 04/12/2020  Time: 8:25 AM

## 2020-04-13 ENCOUNTER — Encounter (HOSPITAL_COMMUNITY): Payer: Self-pay | Admitting: Surgery

## 2020-04-13 LAB — SURGICAL PATHOLOGY

## 2020-04-19 ENCOUNTER — Ambulatory Visit (INDEPENDENT_AMBULATORY_CARE_PROVIDER_SITE_OTHER): Payer: Medicare PPO | Admitting: Internal Medicine

## 2020-04-19 DIAGNOSIS — I359 Nonrheumatic aortic valve disorder, unspecified: Secondary | ICD-10-CM

## 2020-04-19 DIAGNOSIS — Z7901 Long term (current) use of anticoagulants: Secondary | ICD-10-CM

## 2020-04-19 LAB — POCT INR: INR: 1.9 — AB (ref 2.0–3.0)

## 2020-04-19 NOTE — Patient Instructions (Signed)
Description   Take 9 mg tonight and then continue with 7 mg daily except 10 mg each Monday, Wednesday and Friday.  Repeat INR in 2 weeks

## 2020-05-03 ENCOUNTER — Ambulatory Visit (INDEPENDENT_AMBULATORY_CARE_PROVIDER_SITE_OTHER): Payer: Medicare PPO | Admitting: Cardiology

## 2020-05-03 DIAGNOSIS — Z7901 Long term (current) use of anticoagulants: Secondary | ICD-10-CM | POA: Diagnosis not present

## 2020-05-03 DIAGNOSIS — I359 Nonrheumatic aortic valve disorder, unspecified: Secondary | ICD-10-CM

## 2020-05-03 LAB — POCT INR: INR: 2.5 (ref 2.0–3.0)

## 2020-05-17 ENCOUNTER — Ambulatory Visit (INDEPENDENT_AMBULATORY_CARE_PROVIDER_SITE_OTHER): Payer: Medicare PPO | Admitting: Internal Medicine

## 2020-05-17 DIAGNOSIS — Z7901 Long term (current) use of anticoagulants: Secondary | ICD-10-CM | POA: Diagnosis not present

## 2020-05-17 DIAGNOSIS — Z952 Presence of prosthetic heart valve: Secondary | ICD-10-CM | POA: Diagnosis not present

## 2020-05-17 DIAGNOSIS — I359 Nonrheumatic aortic valve disorder, unspecified: Secondary | ICD-10-CM

## 2020-05-17 LAB — POCT INR: INR: 1.7 — AB (ref 2.0–3.0)

## 2020-05-18 ENCOUNTER — Telehealth: Payer: Self-pay | Admitting: Cardiology

## 2020-05-18 NOTE — Telephone Encounter (Signed)
Jasmine called to say patient INR range was 1.7. if you have any question/concern can call back [phone]

## 2020-05-18 NOTE — Telephone Encounter (Signed)
See anticoagulation encounter

## 2020-05-31 ENCOUNTER — Ambulatory Visit (INDEPENDENT_AMBULATORY_CARE_PROVIDER_SITE_OTHER): Payer: Medicare PPO | Admitting: Cardiology

## 2020-05-31 DIAGNOSIS — Z7901 Long term (current) use of anticoagulants: Secondary | ICD-10-CM

## 2020-05-31 DIAGNOSIS — I359 Nonrheumatic aortic valve disorder, unspecified: Secondary | ICD-10-CM | POA: Diagnosis not present

## 2020-05-31 LAB — POCT INR: INR: 2 (ref 2.0–3.0)

## 2020-06-01 ENCOUNTER — Telehealth: Payer: Self-pay | Admitting: Adult Health

## 2020-06-01 NOTE — Telephone Encounter (Signed)
Alexander Barton's wife Alexander Barton) called in today stating that Alexander Barton had gall bladder surgery 3/10 and was doing really well with the anethsia until this Monday. She states since than she has noticed he has been having some anxiety and fear. She would like to know if he could be prescribed something low dosage to help with his anxiety and fears. Appt 5/13. [place] RTC [phone]

## 2020-06-01 NOTE — Telephone Encounter (Signed)
Please review

## 2020-06-01 NOTE — Telephone Encounter (Signed)
Can you call and see what he has taken in the past?

## 2020-06-04 NOTE — Telephone Encounter (Signed)
Rtc to [name] (wife) and she reports Rush Landmark had gall bladder surgery in March and he's done great until this last week. She reports he's anxious and fearful at times, nothing to cause this she said he's just a worrier. She reports he's only taken haldol and zyprexa in the past and they didn't want anything like that. Just something as needed to take the edge off, nothing sedating.

## 2020-06-04 NOTE — Telephone Encounter (Signed)
We can offer vistaril or low dose ativan.

## 2020-06-07 ENCOUNTER — Telehealth: Payer: Self-pay | Admitting: Adult Health

## 2020-06-07 ENCOUNTER — Other Ambulatory Visit: Payer: Self-pay | Admitting: Adult Health

## 2020-06-07 NOTE — Telephone Encounter (Signed)
[name] called again in regards to Alexander Barton being prescribed a low dosage medication. She stated that she would like to try the Ativan. Pls RTC to discuss [phone]. Pharmacy [facility]

## 2020-06-07 NOTE — Telephone Encounter (Signed)
RTC to pt and she stated that Rush Landmark has a lot of anxiety and she thinks he needs something to help with it.She said they discussed at his appt they did not want something that will cause him to sleep all the time and that's why they are requesting a low dose medication like ativan.

## 2020-06-07 NOTE — Telephone Encounter (Signed)
Called and spoke with patient.

## 2020-06-07 NOTE — Telephone Encounter (Signed)
Called and spoke with patient/wife. Will add Zyprexa 1.25 during the day as needed for mood instability.

## 2020-06-11 NOTE — Telephone Encounter (Signed)
Reviewed thanks! 

## 2020-06-12 ENCOUNTER — Encounter: Payer: Self-pay | Admitting: Family Medicine

## 2020-06-12 DIAGNOSIS — B029 Zoster without complications: Secondary | ICD-10-CM | POA: Diagnosis not present

## 2020-06-14 ENCOUNTER — Ambulatory Visit (INDEPENDENT_AMBULATORY_CARE_PROVIDER_SITE_OTHER): Payer: Medicare PPO | Admitting: Cardiology

## 2020-06-14 DIAGNOSIS — I359 Nonrheumatic aortic valve disorder, unspecified: Secondary | ICD-10-CM | POA: Diagnosis not present

## 2020-06-14 DIAGNOSIS — Z7901 Long term (current) use of anticoagulants: Secondary | ICD-10-CM | POA: Diagnosis not present

## 2020-06-14 LAB — POCT INR: INR: 1.8 — AB (ref 2.0–3.0)

## 2020-06-14 NOTE — Telephone Encounter (Signed)
This encounter was created in error - please disregard.

## 2020-06-15 ENCOUNTER — Other Ambulatory Visit: Payer: Self-pay

## 2020-06-15 ENCOUNTER — Ambulatory Visit (INDEPENDENT_AMBULATORY_CARE_PROVIDER_SITE_OTHER): Payer: Medicare PPO | Admitting: Adult Health

## 2020-06-15 ENCOUNTER — Encounter: Payer: Self-pay | Admitting: Adult Health

## 2020-06-15 DIAGNOSIS — G4700 Insomnia, unspecified: Secondary | ICD-10-CM | POA: Diagnosis not present

## 2020-06-15 DIAGNOSIS — F22 Delusional disorders: Secondary | ICD-10-CM

## 2020-06-15 DIAGNOSIS — F331 Major depressive disorder, recurrent, moderate: Secondary | ICD-10-CM

## 2020-06-15 NOTE — Progress Notes (Signed)
Alexander Barton [id number] [birth year] 70 y.o.  Subjective:   Patient ID:  Alexander Barton is a 70 y.o.  Subjective:   Patient ID:  Alexander Barton is a 70 y.o. (DOB 09-Jun-1950) male.  Chief Complaint: No chief complaint on file.   HPI Alexander Barton presents to the office today for follow-up of MDD, delusional disorder, and insomnia.  Accompanied by wife.   Describes mood today as "ok". Pleasant. Mood symptoms - reports decreased depression, anxiety - "I go in circles" - "anything new or changes cause anxiety", and irritability - "at times".  Anxiety and fear about being placed in a nursing home. Mood remains stable. Stating "I'm doing alright". Gets frustrated about "short tem memory loss". Wife feels like he is doing well. Feels like Zyprexa continues to be helpful. Improved interest and motivation. Taking medications as prescribed.  Energy levels improved. Active, does not have a current exercise routine.  Enjoys some usual interests and activities. Married. Lives with wife of 39 years. 2 children and 3 grandsons. Spending time with family. Appetite adequate. Weight gain 5 pounds - 182 to 187 pounds. Sleeps well most nights. Averages 6 - 8 hours.  Focus and concentration difficulties. Completing tasks. Managing aspects of household. 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  Denies SI or HI.  Denies AH or VH.  Previous medication trials: 2008 - Seroquel - allergy. Haldol 33m at bedtime Cogentin.      Mini-Mental   Flowsheet Row Clinical Support from 01/13/2018 in LDe [facility] 12/22/2016 in [facility] Total Score (max 30 points ) '30 30 30    ' PHQ2-9   FAlafayaVisit from 04/09/2020 [facility] Visit from 10/03/2019 in L[place]Office Visit from 05/25/2019 in [place]from 01[place]19/2019 in L[place] PHQ-2 Total Score 0 0 0 0 0    Flowsheet Row Admission (Discharged) from 04/12/2020 in MFlemingNo Risk       Review of Systems:  Review of Systems  Musculoskeletal: Negative for gait problem.  Neurological: Negative for tremors.  Psychiatric/Behavioral:       Please refer to HPI    Medications: I have reviewed the patient's current medications.  Current Outpatient Medications  Medication Sig Dispense Refill  . acetaminophen (TYLENOL) 500 MG tablet Take 1,000 mg by mouth every 6 (six) hours as needed for mild pain.    .Marland Kitchenalbuterol (VENTOLIN HFA) 108 (90 Base) MCG/ACT inhaler Inhale 2 puffs into the lungs every 4 (four) hours as needed for wheezing or shortness of breath.    . blood glucose meter kit and supplies KIT Dispense based on patient and insurance preference. Use twice daily. (FOR ICD-10 E11.9). 1 each 0  . Budeson-Glycopyrrol-Formoterol (BREZTRI AEROSPHERE) 160-9-4.8 MCG/ACT AERO Inhale 2 puffs into the lungs in the morning and at bedtime. (Patient taking differently: Inhale 2 puffs into the lungs 2 (two) times daily as needed (shortness of breath).) 10.7 g 3  . glucose blood test strip Use twice daily E11.9 100 each 1  . Investigational - Study Medication Take 1 tablet by mouth See admin instructions. Study name: [id number] Additional study details: [phone] Take 1 tablet by mouth in the morning    . OLANZapine (ZYPREXA) 5 MG tablet TAKE 1 TABLET BY MOUTH EVERYDAY AT BEDTIME (Patient  taking differently: Take 5 mg by mouth at bedtime.) 90 tablet 1  . OVER THE COUNTER MEDICATION Take 1 capsule by mouth See admin instructions. Dotera Digestive Supplement capsule- Take 1 capsule by mouth in the morning    . polyethylene glycol (MIRALAX / GLYCOLAX) 17 g packet Take 17 g by mouth in the morning.    . senna (SENOKOT) 8.6 MG  TABS tablet Take 2 tablets by mouth in the morning and at bedtime.    . tamsulosin (FLOMAX) 0.4 MG CAPS capsule TAKE 2 CAPSULES BY MOUTH EVERY DAY (Patient taking differently: Take 0.4 mg by mouth in the morning and at bedtime.) 174 capsule 1  . traMADol (ULTRAM) 50 MG tablet Take 1 tablet (50 mg total) by mouth every 6 (six) hours as needed for severe pain. 20 tablet 1  . warfarin (COUMADIN) 2 MG tablet TAKE 1 TABLET WITH 5 MG TABLET DAILY AS DIRECTED BY COUMADIN CLINIC (Patient taking differently: Take 2 mg by mouth every Tuesday, Thursday, Saturday, and Sunday.) 90 tablet 0  . warfarin (COUMADIN) 5 MG tablet TAKE 2 TABLETS BY MOUTH DAILY OR AS DIRECTED (Patient taking differently: Take 5-10 mg by mouth See admin instructions. Take 10 mg by mouth on Monday, Wednesday and Friday and 5 mg on Tuesday, Thursday, Saturday and Sunday) 180 tablet 0   No current facility-administered medications for this visit.    Medication Side Effects: None  Allergies:  Allergies  Allergen Reactions  . Codeine Anaphylaxis  . Gadolinium Derivatives Other (See Comments)    Intense sneezing from MRI contrast  . Hydrocodone Anaphylaxis  . Iodinated Diagnostic Agents Shortness Of Breath and Other (See Comments)    Uncontrollable sneezing and "shortness of breath after prep"- needs sedative the night before and hour and benadryl before using  . Other Other (See Comments)    Unnamed anesthesia- Patient is in the E.D. possibly due to the effects of it: Delirium, confusion, and sleep deprivation  . Statins Other (See Comments)    Muscle pain  . Antihistamines, Diphenhydramine-Type Other (See Comments)    Unable to urinate. All antihistamines.   . Hydromorphone Other (See Comments)    MUSCLE PAIN  . Lipitor [Atorvastatin Calcium] Other (See Comments)    Muscle pain and a "knot" appears on his right side, near the ribcage  . Seroquel [Quetiapine Fumerate] Swelling and Other (See Comments)    "bad trip" and tongue  became swollen    Past Medical History:  Diagnosis Date  . Aortic aneurysm, thoracic ([place])   . Aortic valve disease   . Arthritis   . BPH (benign prostatic hyperplasia)   . Complication of anesthesia    goes into depression  . COPD (chronic obstructive pulmonary disease) ([place])   . GERD (gastroesophageal reflux disease)   . HTN (hypertension)   . Hypercholesterolemia   . Stroke ([place])   . TIA (transient ischemic attack)     Past Medical History, Surgical history, Social history, and Family history were reviewed and updated as appropriate.   Please see review of systems for further details on the patient's review from today.   Objective:   Physical Exam:  There were no vitals taken for this visit.  Physical Exam  Lab Review:     Component Value Date/Time   NA 138 04/12/2020 0603   NA 137 01/10/2019 1228   K 4.3 04/12/2020 0603   CL 105 04/12/2020 0603   CO2 26 04/12/2020 0603   GLUCOSE 130 (H) 04/12/2020 0603  BUN 24 (H) 04/12/2020 0603   BUN 14 01/10/2019 1228   CREATININE 1.28 (H) 04/12/2020 0603   CALCIUM 9.1 04/12/2020 0603   PROT 6.9 01/10/2020 1254   PROT 6.6 01/10/2019 1228   ALBUMIN 4.2 01/10/2020 1254   ALBUMIN 3.7 (L) 01/10/2019 1228   AST 18 01/10/2020 1254   ALT 11 01/10/2020 1254   ALKPHOS 72 01/10/2020 1254   BILITOT 1.1 01/10/2020 1254   BILITOT 0.8 01/10/2019 1228   GFRNONAA 60 (L) 04/12/2020 0603   GFRAA >60 09/22/2019 1831       Component Value Date/Time   WBC 4.6 04/12/2020 0603   RBC 4.15 (L) 04/12/2020 0603   HGB 12.8 (L) 04/12/2020 0603   HGB 9.3 (L) 01/10/2019 1228   HCT 38.8 (L) 04/12/2020 0603   HCT 28.4 (L) 01/10/2019 1228   PLT 173 04/12/2020 0603   PLT 305 01/10/2019 1228   MCV 93.5 04/12/2020 0603   MCV 90 01/10/2019 1228   MCH 30.8 04/12/2020 0603   MCHC 33.0 04/12/2020 0603   RDW 13.3 04/12/2020 0603   RDW 14.3 01/10/2019 1228   LYMPHSABS 2.0 09/22/2019 1831   MONOABS 0.6 09/22/2019 1831   EOSABS 0.1 09/22/2019  1831   BASOSABS 0.0 09/22/2019 1831    No results found for: POCLITH, LITHIUM   No results found for: PHENYTOIN, PHENOBARB, VALPROATE, CBMZ   .res Assessment: Plan:     Plan:  PDMP reviewed  1. Continue Zyprexa 26m   RTC 3 months  Patient advised to contact office with any questions, adverse effects, or acute worsening in signs and symptoms.  Discussed potential metabolic side effects associated with atypical antipsychotics, as well as potential risk for movement side effects. Advised pt to contact office if movement side effects occur.    Diagnoses and all orders for this visit:  Insomnia, unspecified type  Major depressive disorder, recurrent episode, moderate (HMount [place]  Delusional disorder ([place]     Please see After Visit Summary for patient specific instructions.  Future Appointments  Date Time Provider [place] 07/09/2020  3:00 PM [facility] 09/14/2020 10:00 AM Ernestyne Caldwell, RBerdie Ogren NP [facility] None  10/10/2020 10:00 AM SLeone Haven MD [facility]  01/22/2021  8:30 AM O'Brien-Blaney, Denisa L, LPN [facility]    No orders of the defined types were placed in this encounter.   -------------------------------

## 2020-06-21 ENCOUNTER — Ambulatory Visit (INDEPENDENT_AMBULATORY_CARE_PROVIDER_SITE_OTHER): Payer: Medicare PPO | Admitting: Cardiology

## 2020-06-21 DIAGNOSIS — I359 Nonrheumatic aortic valve disorder, unspecified: Secondary | ICD-10-CM

## 2020-06-21 DIAGNOSIS — Z7901 Long term (current) use of anticoagulants: Secondary | ICD-10-CM

## 2020-06-21 DIAGNOSIS — Z952 Presence of prosthetic heart valve: Secondary | ICD-10-CM

## 2020-06-21 LAB — POCT INR: INR: 2.1 (ref 2.0–3.0)

## 2020-06-28 ENCOUNTER — Ambulatory Visit (INDEPENDENT_AMBULATORY_CARE_PROVIDER_SITE_OTHER): Payer: Medicare PPO | Admitting: Cardiology

## 2020-06-28 DIAGNOSIS — Z7901 Long term (current) use of anticoagulants: Secondary | ICD-10-CM

## 2020-06-28 DIAGNOSIS — I359 Nonrheumatic aortic valve disorder, unspecified: Secondary | ICD-10-CM

## 2020-06-28 LAB — POCT INR: INR: 2.6 (ref 2.0–3.0)

## 2020-07-06 ENCOUNTER — Ambulatory Visit (INDEPENDENT_AMBULATORY_CARE_PROVIDER_SITE_OTHER): Payer: Medicare PPO | Admitting: Cardiovascular Disease

## 2020-07-06 DIAGNOSIS — Z7901 Long term (current) use of anticoagulants: Secondary | ICD-10-CM

## 2020-07-06 DIAGNOSIS — I359 Nonrheumatic aortic valve disorder, unspecified: Secondary | ICD-10-CM

## 2020-07-06 LAB — POCT INR: INR: 2.4 (ref 2.0–3.0)

## 2020-07-09 ENCOUNTER — Ambulatory Visit
Admission: RE | Admit: 2020-07-09 | Discharge: 2020-07-09 | Disposition: A | Discharge status: Home / Self Care | Payer: Medicare PPO | Source: Ambulatory Visit | Attending: Family Medicine | Admitting: Family Medicine

## 2020-07-09 ENCOUNTER — Other Ambulatory Visit: Payer: Self-pay

## 2020-07-09 DIAGNOSIS — E041 Nontoxic single thyroid nodule: Secondary | ICD-10-CM | POA: Insufficient documentation

## 2020-07-10 ENCOUNTER — Other Ambulatory Visit: Payer: Self-pay | Admitting: Cardiology

## 2020-07-13 ENCOUNTER — Ambulatory Visit (INDEPENDENT_AMBULATORY_CARE_PROVIDER_SITE_OTHER): Payer: Medicare PPO | Admitting: Pharmacist Clinician (PhC)/ Clinical Pharmacy Specialist

## 2020-07-13 DIAGNOSIS — I359 Nonrheumatic aortic valve disorder, unspecified: Secondary | ICD-10-CM | POA: Diagnosis not present

## 2020-07-13 DIAGNOSIS — Z952 Presence of prosthetic heart valve: Secondary | ICD-10-CM

## 2020-07-13 DIAGNOSIS — Z8673 Personal history of transient ischemic attack (TIA), and cerebral infarction without residual deficits: Secondary | ICD-10-CM

## 2020-07-13 DIAGNOSIS — Z7901 Long term (current) use of anticoagulants: Secondary | ICD-10-CM | POA: Diagnosis not present

## 2020-07-13 LAB — POCT INR: INR: 2.7 (ref 2.0–3.0)

## 2020-07-19 ENCOUNTER — Ambulatory Visit (INDEPENDENT_AMBULATORY_CARE_PROVIDER_SITE_OTHER): Payer: Medicare PPO | Admitting: Pharmacist Clinician (PhC)/ Clinical Pharmacy Specialist

## 2020-07-19 DIAGNOSIS — I359 Nonrheumatic aortic valve disorder, unspecified: Secondary | ICD-10-CM

## 2020-07-19 DIAGNOSIS — Z952 Presence of prosthetic heart valve: Secondary | ICD-10-CM

## 2020-07-19 DIAGNOSIS — Z7901 Long term (current) use of anticoagulants: Secondary | ICD-10-CM

## 2020-07-19 LAB — POCT INR: INR: 2.4 (ref 2.0–3.0)

## 2020-07-30 ENCOUNTER — Ambulatory Visit (INDEPENDENT_AMBULATORY_CARE_PROVIDER_SITE_OTHER): Payer: Medicare PPO | Admitting: Cardiology

## 2020-07-30 DIAGNOSIS — Z7901 Long term (current) use of anticoagulants: Secondary | ICD-10-CM | POA: Diagnosis not present

## 2020-07-30 DIAGNOSIS — I359 Nonrheumatic aortic valve disorder, unspecified: Secondary | ICD-10-CM | POA: Diagnosis not present

## 2020-07-30 LAB — POCT INR: INR: 2.3 (ref 2.0–3.0)

## 2020-08-08 ENCOUNTER — Other Ambulatory Visit: Payer: Self-pay | Admitting: Adult Health

## 2020-08-08 DIAGNOSIS — F331 Major depressive disorder, recurrent, moderate: Secondary | ICD-10-CM

## 2020-08-08 DIAGNOSIS — G4700 Insomnia, unspecified: Secondary | ICD-10-CM

## 2020-08-10 NOTE — Telephone Encounter (Signed)
Alexander Barton's wife called to check on status of this refill.  I RS his appt from 8/12 that we cancelled due to [name]'s vacation.  He is now scheduled for 09/07/20.

## 2020-08-13 ENCOUNTER — Ambulatory Visit (INDEPENDENT_AMBULATORY_CARE_PROVIDER_SITE_OTHER): Payer: Medicare PPO | Admitting: Cardiology

## 2020-08-13 DIAGNOSIS — Z7901 Long term (current) use of anticoagulants: Secondary | ICD-10-CM

## 2020-08-13 DIAGNOSIS — I359 Nonrheumatic aortic valve disorder, unspecified: Secondary | ICD-10-CM

## 2020-08-13 LAB — POCT INR: INR: 2.4 (ref 2.0–3.0)

## 2020-08-20 ENCOUNTER — Other Ambulatory Visit: Payer: Self-pay | Admitting: Family Medicine

## 2020-08-31 ENCOUNTER — Ambulatory Visit (INDEPENDENT_AMBULATORY_CARE_PROVIDER_SITE_OTHER): Payer: Medicare PPO | Admitting: Cardiovascular Disease

## 2020-08-31 DIAGNOSIS — I359 Nonrheumatic aortic valve disorder, unspecified: Secondary | ICD-10-CM

## 2020-08-31 DIAGNOSIS — Z5181 Encounter for therapeutic drug level monitoring: Secondary | ICD-10-CM | POA: Diagnosis not present

## 2020-08-31 DIAGNOSIS — Z7901 Long term (current) use of anticoagulants: Secondary | ICD-10-CM

## 2020-08-31 LAB — POCT INR: INR: 1.8 — AB (ref 2.0–3.0)

## 2020-09-06 IMAGING — MR MR ABDOMEN WO/W CM
1 series · 8 of 8 positions shown · IV contrast (gadavist)
Comparison: CT scans 05/26/2019 and 05/29/2019

CLINICAL DATA: Pancreatic tail mass. Possible hemorrhagic
pseudocyst

EXAM:
MRI ABDOMEN WITHOUT AND WITH CONTRAST
TECHNIQUE: Multiplanar multisequence MR imaging of the abdomen was performed
both before and after the administration of intravenous contrast.
CONTRAST:  10mL GADAVIST GADOBUTROL 1 MMOL/ML IV SOLN

[Series 6: [id number]_[id number]_320_iso-resp · 8 of 8 slices shown]
[im 1/8]
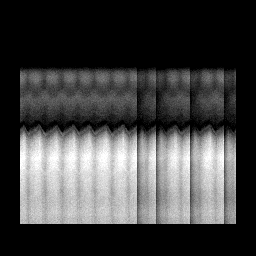
[im 2/8]
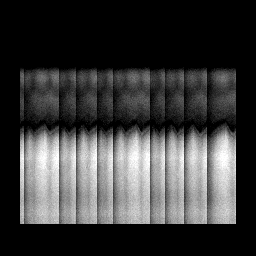
[im 3/8]
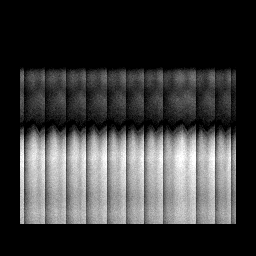
[im 4/8]
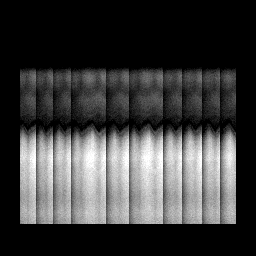
[im 5/8]
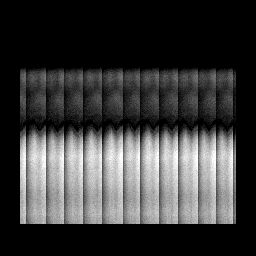
[im 6/8]
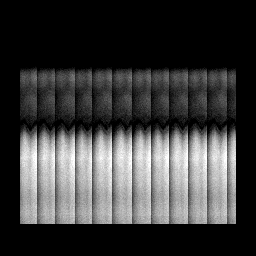
[im 7/8]
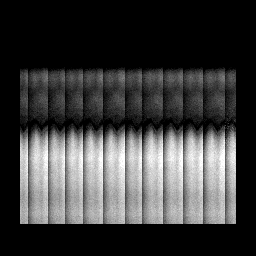
[im 8/8]
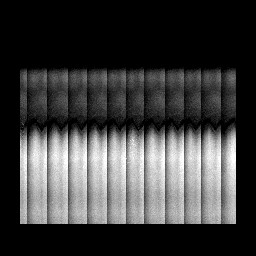

[8 of 8 positions shown; findings below may reference images not displayed]

FINDINGS: Lower chest: The lung bases demonstrates streaky atelectasis but no
pleural or pericardial effusion. No worrisome pulmonary lesions. The
distal esophagus is grossly normal. Aortic dissection is again
demonstrated.

Hepatobiliary: No hepatic lesions are identified. No intrahepatic
biliary dilatation. The portal and hepatic veins are patent.

Gallstones are noted in the gallbladder along with some layering
gallbladder sludge. There is mild gallbladder wall thickening and
inflammation. Normal caliber and course of the common bile duct. No
common bile duct stones are identified.

Pancreas: The pancreatic head is normal. No mass or acute
inflammation. The main pancreatic duct is normal in caliber and has
a normal course.

Persistent large heterogeneous masslike lesion in the pancreatic
tail measures approximately 8.8 x 5.3 cm. Heterogeneous T1 and T2
signal intensity with several areas of increased T1 signal intensity
consistent with hemorrhage. No contrast enhancement is demonstrated.
Findings most consistent with a complex hemorrhagic pseudocyst.

Spleen:  Normal size. No focal lesions.

Adrenals/Urinary Tract: The adrenal glands and kidneys are
unremarkable. Simple right renal cyst is noted.

Stomach/Bowel: The stomach, duodenum, visualized small bowel loops
and visualized colon are unremarkable.

Vascular/Lymphatic: Aortic dissection is again demonstrated which is
a continuation of the thoracic aortic dissection. This continues all
the way down to the bifurcation. The branch vessels are patent.

Scattered small mesenteric and retroperitoneal lymph nodes. Fluid is
noted along the medial liver edge and there is a small focal fluid
collection along the anterior wall of the antral region of the
stomach. Thin rim like enhancement is noted. This is likely a small
pseudocyst.

Other:  None.

Musculoskeletal: Musculoskeletal no significant bony findings.
IMPRESSION: 1. 8.8 x 5.3 cm heterogeneous masslike lesion in the pancreatic tail
is most consistent with a complex hemorrhagic pseudocyst.
2. Cholelithiasis with mild gallbladder wall thickening and
inflammation.
3. No common bile duct stones or biliary dilatation.
4. Chronic aortic dissection.  Aortic branch vessels are patent.
5. Small fluid collection along the anterior wall of the stomach,
likely a small pseudocyst. There is also fluid along the medial
liver edge which does not appear organized.

## 2020-09-07 ENCOUNTER — Encounter: Payer: Self-pay | Admitting: Adult Health

## 2020-09-07 ENCOUNTER — Other Ambulatory Visit: Payer: Self-pay

## 2020-09-07 ENCOUNTER — Ambulatory Visit: Payer: Medicare PPO | Admitting: Adult Health

## 2020-09-07 DIAGNOSIS — F331 Major depressive disorder, recurrent, moderate: Secondary | ICD-10-CM

## 2020-09-07 DIAGNOSIS — G4700 Insomnia, unspecified: Secondary | ICD-10-CM

## 2020-09-07 DIAGNOSIS — F22 Delusional disorders: Secondary | ICD-10-CM | POA: Diagnosis not present

## 2020-09-07 MED ORDER — OLANZAPINE 5 MG PO TABS
ORAL_TABLET | ORAL | 1 refills | Status: DC
Start: 2020-09-07 — End: 2021-03-11

## 2020-09-07 NOTE — Progress Notes (Signed)
Alexander Barton [id number] 04/17/1950 70 y.o.  Subjective:   Patient ID:  TERYN GUST is a 70 y.o. (DOB [birth year]) male.  Chief Complaint: No chief complaint on file.   HPI [facility] presents to the office today for follow-up of MDDD, delusional disorder, and insomnia.  Accompanied by wife.   Describes mood today as "ok". Pleasant. Mood symptoms - reports some depression and anxiety. Feels irritable at times. Denies tearfulness. Stating "I'm doing pretty good". Increased anxiety related to recent GI issues. Stating "it's making it difficult for me to do anything". Has an appointment with GI to discuss.  Energy levels low - not able to get outside and walk. Recent trip to [place] for he and wife's 50th anniversary. Improved interest and motivation. Taking medications as prescribed.  Energy levels vary. Active, does not have a current exercise routine. Walking indoors.  Enjoys some usual interests and activities. Married. Lives with wife of 40 years. 2 children and 3 grandsons. Spending time with family. Appetite adequate. Weight loss 4 pounds - 183 pounds. Sleeps well most nights. Averages 6 - 8 hours.  Focus and concentration "a little better". Some difficulties remembering things at times. Completing tasks. Managing aspects of household. 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  Denies SI or HI.  Denies AH or VH.  Previous medication trials: 2008 - Seroquel - allergy. Haldol 91m at bedtime Cogentin.  .    Mini-Mental    Flowsheet Row Clinical Support from 01/13/2018 in L[facility]from 12/22/2016 in [facility] Total Score (max 30 points ) '30 30 30      ' PHQ2-9    FArchuletaVisit from [facility]/03/2019  [facility] from 05/25/2019 in L[place]/2019 [place] PHQ-2 Total Score 0 0 0 0 0      Flowsheet Row Admission (Discharged) from 04/12/2020 in MGreeleyNo Risk        Review of Systems:  Review of Systems  Musculoskeletal:  Negative for gait problem.  Neurological:  Negative for tremors.  Psychiatric/Behavioral:         Please refer to HPI   Medications: I have reviewed the patient's current medications.  Current Outpatient Medications  Medication Sig Dispense Refill   acetaminophen (TYLENOL) 500 MG tablet Take 1,000 mg by mouth every 6 (six) hours as needed for mild pain.     albuterol (VENTOLIN HFA) 108 (90 Base) MCG/ACT inhaler Inhale 2 puffs into the lungs every 4 (four) hours as needed for wheezing or shortness of breath.     blood glucose meter kit and supplies KIT Dispense based on patient and insurance preference. Use twice daily. (FOR ICD-10 E11.9). 1 each 0   Budeson-Glycopyrrol-Formoterol (BREZTRI AEROSPHERE) 160-9-4.8 MCG/ACT AERO Inhale 2 puffs into the lungs in the morning and at bedtime. (Patient taking differently: Inhale 2 puffs into the lungs 2 (two) times daily as needed (shortness of breath).) 10.7 g 3   glucose blood test strip Use twice daily E11.9 100 each 1   Investigational - Study Medication Take 1 tablet by mouth See admin instructions. Study name: [id number] Additional study details: [id number] Take 1 tablet by mouth in  the morning     OLANZapine (ZYPREXA) 5 MG tablet TAKE 1 TABLET BY MOUTH EVERYDAY AT BEDTIME 90 tablet 1   OVER THE COUNTER MEDICATION Take 1 capsule by mouth See admin instructions. Dotera Digestive Supplement capsule- Take 1 capsule by mouth in the morning     polyethylene glycol (MIRALAX / GLYCOLAX) 17 g packet Take 17 g by mouth in the morning.     senna  (SENOKOT) 8.6 MG TABS tablet Take 2 tablets by mouth in the morning and at bedtime.     tamsulosin (FLOMAX) 0.4 MG CAPS capsule TAKE 2 CAPSULES BY MOUTH EVERY DAY 174 capsule 1   traMADol (ULTRAM) 50 MG tablet Take 1 tablet (50 mg total) by mouth every 6 (six) hours as needed for severe pain. 20 tablet 1   warfarin (COUMADIN) 2 MG tablet TAKE 1 TABLET WITH 5 MG TABLET DAILY AS DIRECTED BY COUMADIN CLINIC (Patient taking differently: Take 2 mg by mouth every Tuesday, Thursday, Saturday, and Sunday.) 90 tablet 0   warfarin (COUMADIN) 5 MG tablet TAKE 1 TABLET BY MOUTH DAILY AS DIRECTED WITH 2 MG TABLET 90 tablet 1   No current facility-administered medications for this visit.    Medication Side Effects: None  Allergies:  Allergies  Allergen Reactions   Codeine Anaphylaxis   Gadolinium Derivatives Other (See Comments)    Intense sneezing from MRI contrast   Hydrocodone Anaphylaxis   Iodinated Diagnostic Agents Shortness Of Breath and Other (See Comments)    Uncontrollable sneezing and "shortness of breath after prep"- needs sedative the night before and hour and benadryl before using   Other Other (See Comments)    Unnamed anesthesia- Patient is in the E.D. possibly due to the effects of it: Delirium, confusion, and sleep deprivation   Statins Other (See Comments)    Muscle pain   Antihistamines, Diphenhydramine-Type Other (See Comments)    Unable to urinate. All antihistamines.    Hydromorphone Other (See Comments)    MUSCLE PAIN   Lipitor [Atorvastatin Calcium] Other (See Comments)    Muscle pain and a "knot" appears on his right side, near the ribcage   Seroquel [Quetiapine Fumerate] Swelling and Other (See Comments)    "bad trip" and tongue became swollen    Past Medical History:  Diagnosis Date   Aortic aneurysm, thoracic (HCC)    Aortic valve disease    Arthritis    BPH (benign prostatic hyperplasia)    Complication of anesthesia    goes into depression   COPD (chronic  obstructive pulmonary disease) (HCC)    GERD (gastroesophageal reflux disease)    HTN (hypertension)    Hypercholesterolemia    Stroke ([place])    TIA (transient ischemic attack)     Past Medical History, Surgical history, Social history, and Family history were reviewed and updated as appropriate.   Please see review of systems for further details on the patient's review from today.   Objective:   Physical Exam:  There were no vitals taken for this visit.  Physical Exam  Lab Review:     Component Value Date/Time   NA 138 04/12/2020 0603   NA 137 01/10/2019 1228   K 4.3 04/12/2020 0603   CL 105 04/12/2020 0603   CO2 26 04/12/2020 0603   GLUCOSE 130 (H) 04/12/2020 0603   BUN 24 (H) 04/12/2020 0603   BUN 14 01/10/2019 1228   CREATININE 1.28 (H) 04/12/2020 0603   CALCIUM 9.1 04/12/2020 0603   PROT 6.9 01/10/2020 1254  PROT 6.6 01/10/2019 1228   ALBUMIN 4.2 01/10/2020 1254   ALBUMIN 3.7 (L) 01/10/2019 1228   AST 18 01/10/2020 1254   ALT 11 01/10/2020 1254   ALKPHOS 72 01/10/2020 1254   BILITOT 1.1 01/10/2020 1254   BILITOT 0.8 01/10/2019 1228   GFRNONAA 60 (L) 04/12/2020 0603   GFRAA >60 09/22/2019 1831       Component Value Date/Time   WBC 4.6 04/12/2020 0603   RBC 4.15 (L) 04/12/2020 0603   HGB 12.8 (L) 04/12/2020 0603   HGB 9.3 (L) 01/10/2019 1228   HCT 38.8 (L) 04/12/2020 0603   HCT 28.4 (L) 01/10/2019 1228   PLT 173 04/12/2020 0603   PLT 305 01/10/2019 1228   MCV 93.5 04/12/2020 0603   MCV 90 01/10/2019 1228   MCH 30.8 04/12/2020 0603   MCHC 33.0 04/12/2020 0603   RDW 13.3 04/12/2020 0603   RDW 14.3 01/10/2019 1228   LYMPHSABS 2.0 09/22/2019 1831   MONOABS 0.6 09/22/2019 1831   EOSABS 0.1 09/22/2019 1831   BASOSABS 0.0 09/22/2019 1831    No results found for: POCLITH, LITHIUM   No results found for: PHENYTOIN, PHENOBARB, VALPROATE, CBMZ   .res Assessment: Plan:    Plan:  PDMP reviewed  1. Continue Zyprexa 42m   RTC 3 months   Time  spent with patient was 35 minutes. Greater than 50% of face to face time with patient was spent on counseling and coordination of care.     Patient advised to contact office with any questions, adverse effects, or acute worsening in signs and symptoms.  Discussed potential metabolic side effects associated with atypical antipsychotics, as well as potential risk for movement side effects. Advised pt to contact office if movement side effects occur.    Diagnoses and all orders for this visit:  Delusional disorder ([place]  Major depressive disorder, recurrent episode, moderate (HCC) -     OLANZapine (ZYPREXA) 5 MG tablet; TAKE 1 TABLET BY MOUTH EVERYDAY AT BEDTIME  Insomnia, unspecified type -     OLANZapine (ZYPREXA) 5 MG tablet; TAKE 1 TABLET BY MOUTH EVERYDAY AT BEDTIME    Please see After Visit Summary for patient specific instructions.  Future Appointments  Date Time Provider [place] 10/10/2020 10:00 AM SLeone Haven MD [facility] 01/22/2021  8:15 AM [facility]  03/11/2021 10:40 AM Nada Godley, RBerdie Ogren NP [facility] None    No orders of the defined types were placed in this encounter.   -------------------------------

## 2020-09-09 IMAGING — DX DG CHEST 1V PORT
1 series · 1 of 1 positions shown · non-contrast
Comparison: Chest radiograph and chest CT October 20, 2017

CLINICAL DATA: Shortness of breath

EXAM:
PORTABLE CHEST 1 VIEW

[chest ap]
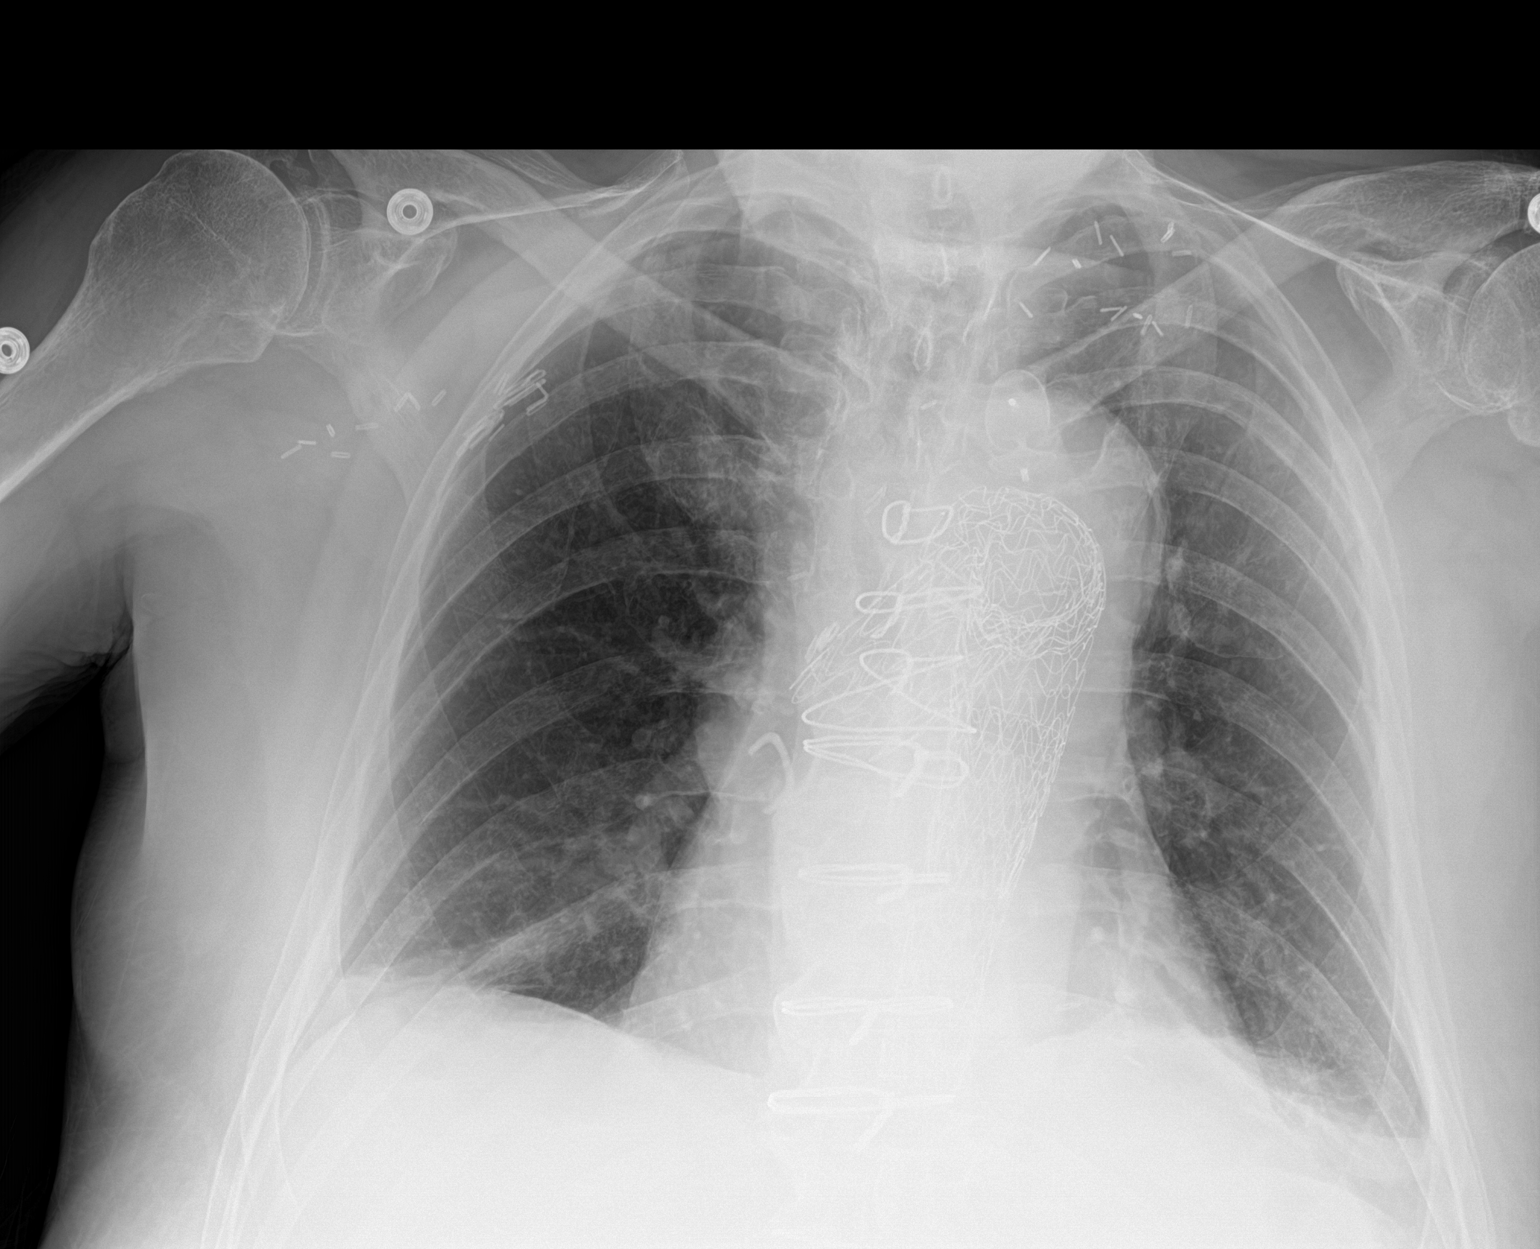

[1 of 1 positions shown; findings below may reference images not displayed]

FINDINGS: There is bibasilar atelectasis. The lungs elsewhere are clear. Heart
is upper normal in size with pulmonary vascularity normal. There is
aneurysmal dilatation of the aorta with stent in the aorta extending
from the mid ascending aorta to the distal descending aorta. Patient
is status post coronary artery bypass grafting. Surgical clips in
left axilla. No adenopathy. Surgical clips also noted in right
axillary region.
IMPRESSION: Stent placement within the aorta for diffuse aneurysm. Bibasilar
atelectasis. No edema or airspace opacity. Stable heart size.

## 2020-09-11 IMAGING — CT CT CTA ABD/PEL W/CM AND/OR W/O CM
2 of 9 series · 10 of 46 positions shown, 16 images · IV contrast (Omni 300)
Comparison: MRI of the abdomen on 05/30/2019 and CT of the abdomen
on 05/29/2019

CLINICAL DATA: Pancreatitis and pancreatic pseudocyst with evidence
internal hemorrhage of a dominant pseudocyst at the level of the
tail of the pancreas by prior imaging.

EXAM:
CT ANGIOGRAPHY ABDOMEN AND PELVIS WITH CONTRAST
TECHNIQUE: Multidetector CT imaging of the abdomen and pelvis was performed
using the standard protocol during bolus administration of
intravenous contrast. Multiplanar reconstructed images and MIPs were
obtained and reviewed to evaluate the vascular anatomy.
CONTRAST:  100mL OMNIPAQUE IOHEXOL 350 MG/ML SOLN

[Series 7: renal cta cor · coronal · 0.93mm/px · 2 of 174 slices shown, 3 images]
[im 58/174  soft-tissue]
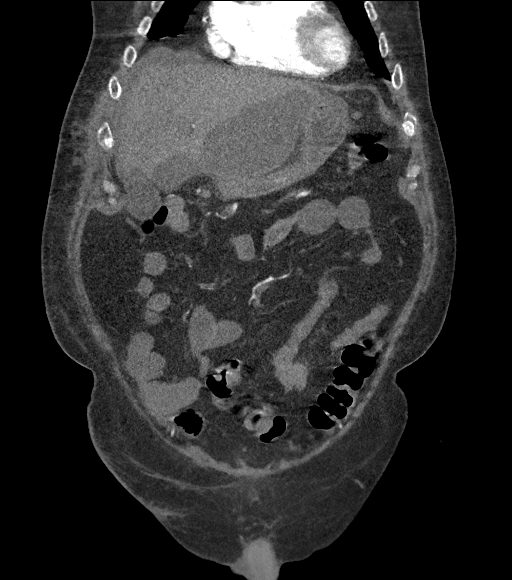
[im 58/174  bone]
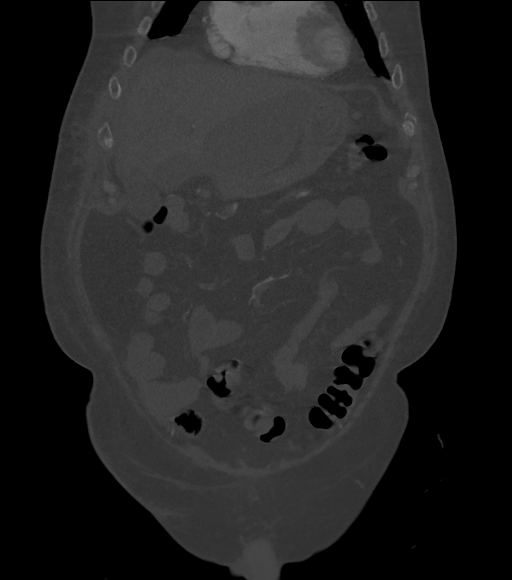
[im 116/174  soft-tissue]
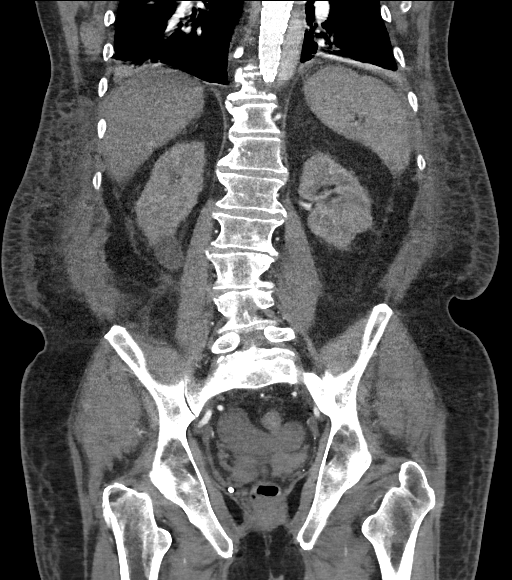

[Series 11: venous 5.0 · axial · portal-venous · 0.96mm/px · z∈[+810,+1230]mm · 8 of 108 slices shown, 13 images]
[im 12/108  soft-tissue]
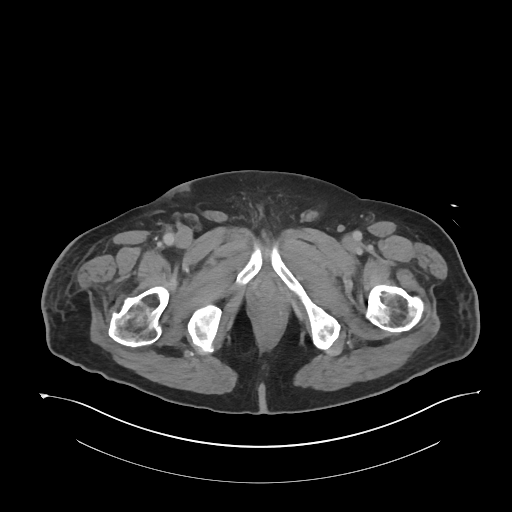
[im 12/108  bone]
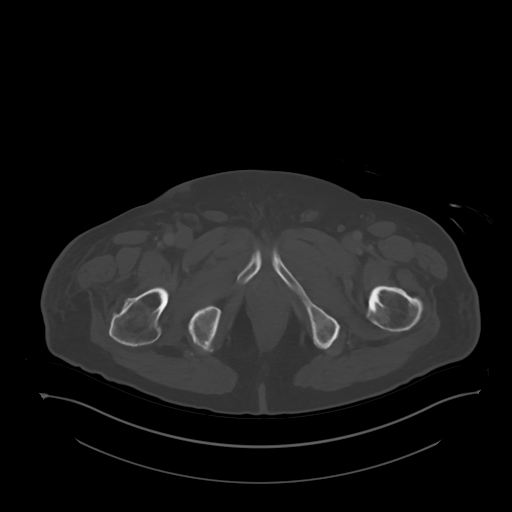
[im 24/108  soft-tissue]
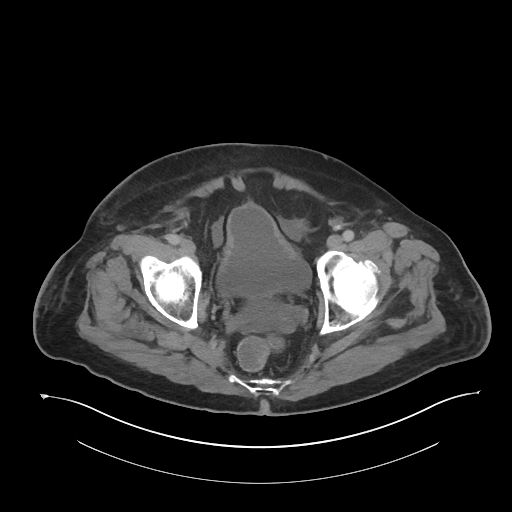
[im 36/108  soft-tissue]
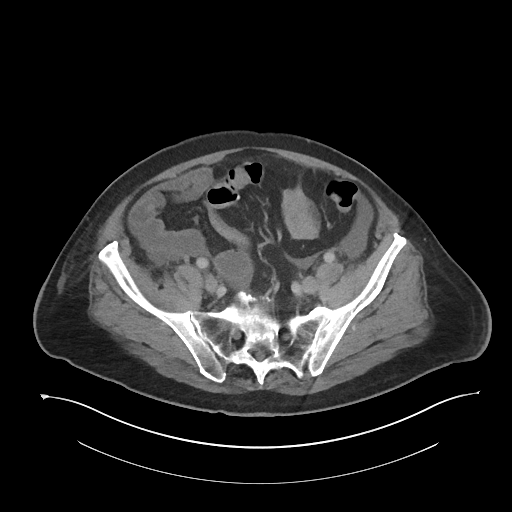
[im 48/108  soft-tissue]
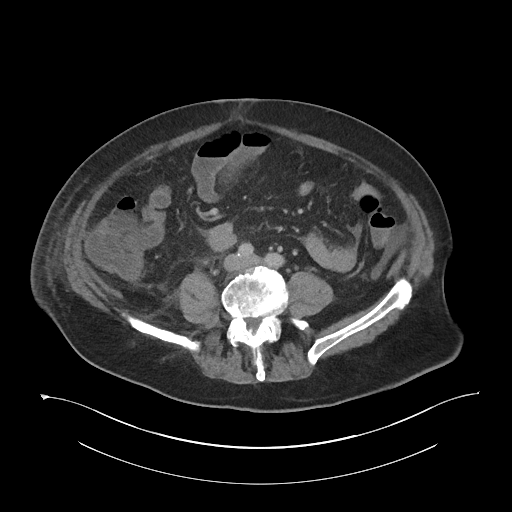
[im 60/108  soft-tissue]
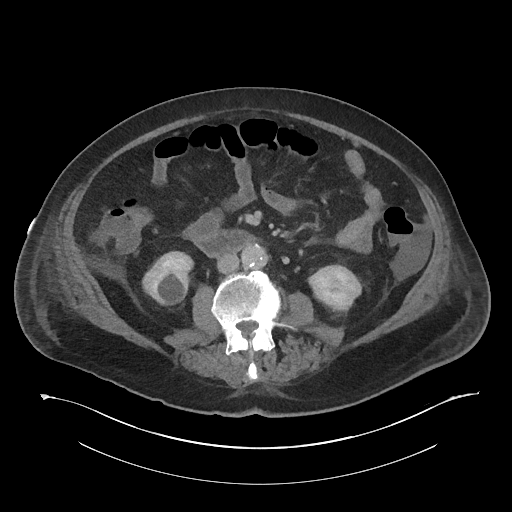
[im 60/108  lung]
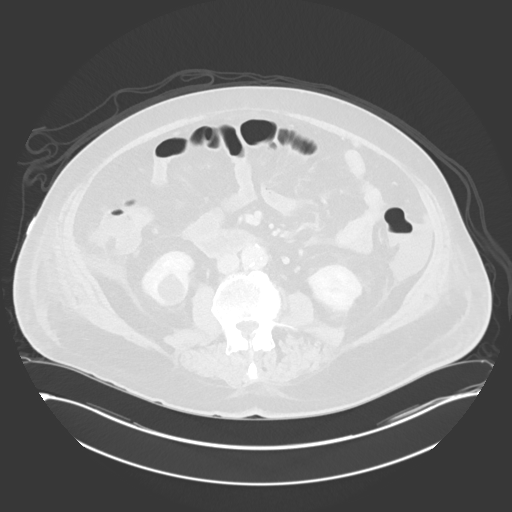
[im 72/108  soft-tissue]
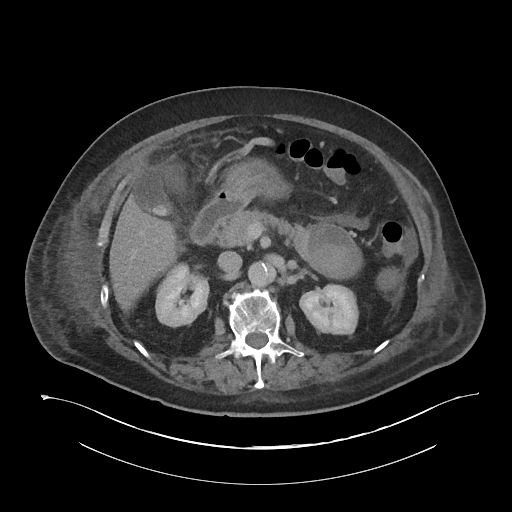
[im 72/108  lung]
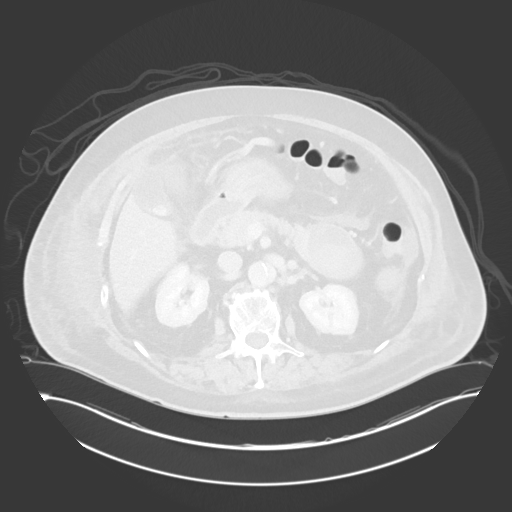
[im 84/108  soft-tissue]
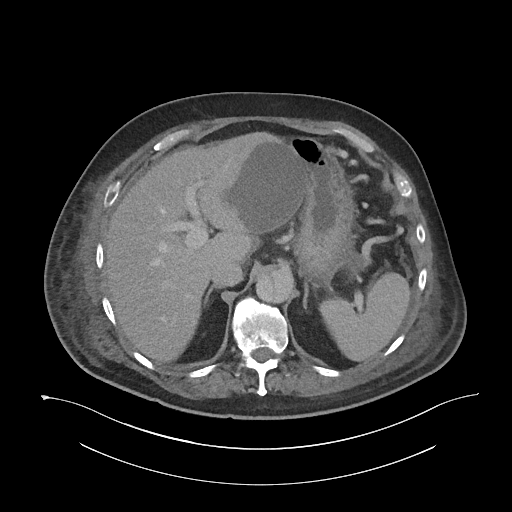
[im 84/108  lung]
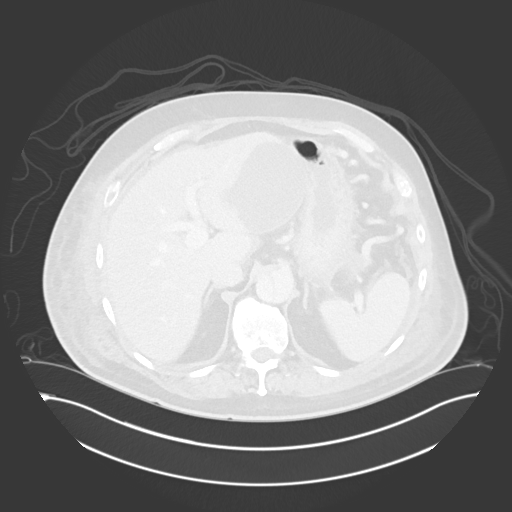
[im 96/108  soft-tissue]
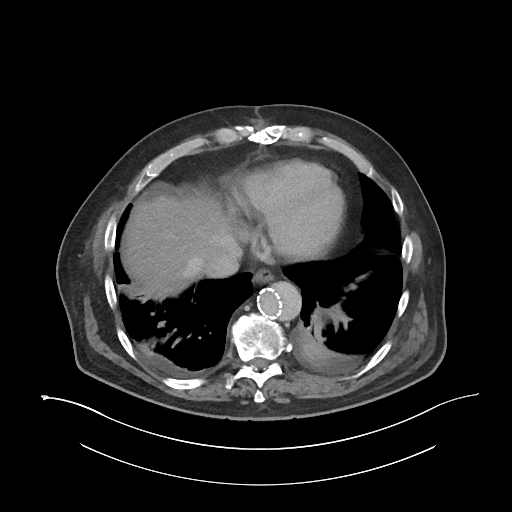
[im 96/108  lung]
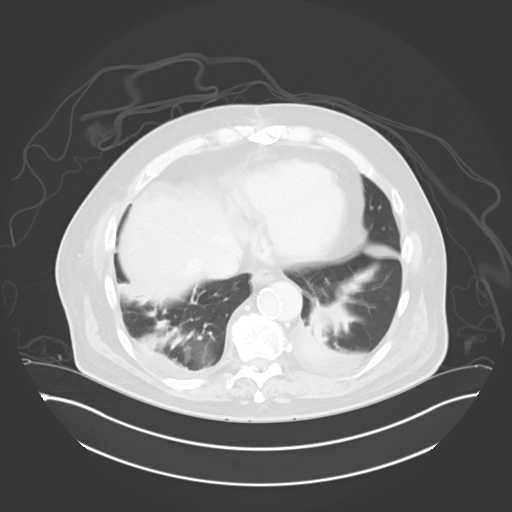

[10 of 46 positions shown; findings below may reference images not displayed]

FINDINGS: VASCULAR

Aorta: Evidence of prior aortic dissection with visualization of the
lower and of an endograft within the true lumen of the distal
descending thoracic aorta. This endograft extends to the
diaphragmatic hiatus. Maximum diameter of the proximal abdominal
aorta is stable and measures 3.2 x 3.4 cm. Dissection extends into
the left common iliac artery and terminates before the common iliac
bifurcation.

Celiac: Celiac axis extends off of the false lumen and is patent.

SMA: Dissection partially extends into the origin of the SMA and the
SMA is patent with most of the flow appearing to emanate off of the
false lumen.

Renals: Patent right renal artery extends off the true lumen and
patent left renal artery appears to emanate off of the posterior
aspect of the aorta partially off of the false lumen and potentially
with some contribution from the true lumen.

IMA: Patent IMA emanates off of the true lumen.

Inflow: Bilateral common, external and internal iliac arteries
demonstrate normal patency.

Proximal Outflow: Normally patent bilateral common femoral arteries
and femoral bifurcations.

Veins: The splenic vein is patent at the level of the splenic hilum.
As it is followed, it is compressed by the pseudocyst centered at
the tail of the pancreas. Central portion of the splenic vein
appears patent. Other venous structures appear normally patent
including the portal vein, visualized mesenteric veins, IVC,
bilateral renal veins, iliac veins and common femoral veins.

Review of the MIP images confirms the above findings.

NON-VASCULAR

Lower chest: Bibasilar atelectasis and tiny bilateral pleural
effusions.

Hepatobiliary: No hepatic lesions or biliary dilatation. Stable
gallstones.

Pancreas: Head and body of the pancreas demonstrate no necrosis,
mass or ductal dilatation.

Spleen: Normal in size without focal abnormality.

Adrenals/Urinary Tract: Adrenal glands are unremarkable. Kidneys are
normal, without renal calculi, focal lesion, or hydronephrosis.
Bladder is unremarkable.

Stomach/Bowel: Bowel shows no evidence of obstruction, ileus,
inflammation, lesion or free air.

Lymphatic: No enlarged lymph nodes identified.

Reproductive: Prostate is unremarkable.

Other: High-density pseudocyst centered around the tail of the
pancreas shows stable to slightly smaller size and measures
approximately 5.3 x 9.5 cm transversely. Internal density is again
consistent with complex fluid and stable between arterial and venous
studies with no evidence of contrast extravasation to suggest active
bleeding or focal arterial pseudoaneurysm.

Enlarging and maturing pseudocyst is identified medial to the lesser
curvature of the stomach and invaginating the capsule of the left
lobe of the liver measuring approximately 8.9 x 8.6 cm in transverse
dimensions. This region of fluid measured approximately 5.5 cm in
diameter on the prior study. This fluid collection does not appear
hemorrhagic or infected. Adjacent and slightly more inferior
collection abutting the liver and gallbladder measures approximately
3.5 x 6.7 cm and previously measured approximately 5 cm in greatest
diameter. This is also consistent with evolving pseudocyst and has
identical density to the larger adjacent pseudocyst without evidence
of hemorrhage or infection. There is some free fluid in the
pericolic gutters and pelvis.

Musculoskeletal: No acute or significant osseous findings.
IMPRESSION: 1. Stable to slightly smaller complex high density pseudocyst
centered at the level of the tail of the pancreas. CTA demonstrates
no evidence to suggest active bleeding within this pseudocyst or
associated arterial pseudoaneurysm. The pseudocyst does compress the
splenic vein without obvious splenic vein thrombosis.
2. Maturing and enlarging adjacent pseudocysts with roughly 9 cm
maturing pseudocyst between the stomach and left lobe of the liver
and adjacent smaller collection abutting the liver and gallbladder.
Both of the pseudocysts demonstrate lower internal fluid density and
demonstrate no evidence to suggest internal hemorrhage or infection
by CT.

## 2020-09-13 ENCOUNTER — Other Ambulatory Visit: Payer: Self-pay

## 2020-09-13 ENCOUNTER — Encounter (HOSPITAL_COMMUNITY): Payer: Self-pay

## 2020-09-13 ENCOUNTER — Telehealth: Payer: Self-pay | Admitting: Cardiology

## 2020-09-13 ENCOUNTER — Emergency Department (HOSPITAL_COMMUNITY): Payer: Medicare PPO

## 2020-09-13 ENCOUNTER — Emergency Department (HOSPITAL_COMMUNITY)
Admission: EM | Admit: 2020-09-13 | Discharge: 2020-09-13 | Disposition: A | Discharge status: Home / Self Care | Payer: Medicare PPO | Attending: Emergency Medicine | Admitting: Emergency Medicine

## 2020-09-13 DIAGNOSIS — Z7901 Long term (current) use of anticoagulants: Secondary | ICD-10-CM | POA: Insufficient documentation

## 2020-09-13 DIAGNOSIS — E119 Type 2 diabetes mellitus without complications: Secondary | ICD-10-CM | POA: Insufficient documentation

## 2020-09-13 DIAGNOSIS — I499 Cardiac arrhythmia, unspecified: Secondary | ICD-10-CM | POA: Diagnosis present

## 2020-09-13 DIAGNOSIS — R197 Diarrhea, unspecified: Secondary | ICD-10-CM | POA: Diagnosis not present

## 2020-09-13 DIAGNOSIS — J449 Chronic obstructive pulmonary disease, unspecified: Secondary | ICD-10-CM | POA: Insufficient documentation

## 2020-09-13 DIAGNOSIS — I4892 Unspecified atrial flutter: Secondary | ICD-10-CM | POA: Insufficient documentation

## 2020-09-13 DIAGNOSIS — Z8616 Personal history of COVID-19: Secondary | ICD-10-CM | POA: Insufficient documentation

## 2020-09-13 DIAGNOSIS — J45909 Unspecified asthma, uncomplicated: Secondary | ICD-10-CM | POA: Insufficient documentation

## 2020-09-13 DIAGNOSIS — I10 Essential (primary) hypertension: Secondary | ICD-10-CM | POA: Diagnosis not present

## 2020-09-13 DIAGNOSIS — R079 Chest pain, unspecified: Secondary | ICD-10-CM | POA: Diagnosis not present

## 2020-09-13 DIAGNOSIS — R000 Tachycardia, unspecified: Secondary | ICD-10-CM | POA: Diagnosis not present

## 2020-09-13 LAB — CBC
HCT: 38.5 % — ABNORMAL LOW (ref 39.0–52.0)
Hemoglobin: 12.8 g/dL — ABNORMAL LOW (ref 13.0–17.0)
MCH: 30.9 pg (ref 26.0–34.0)
MCHC: 33.2 g/dL (ref 30.0–36.0)
MCV: 93 fL (ref 80.0–100.0)
Platelets: 173 10*3/uL (ref 150–400)
RBC: 4.14 MIL/uL — ABNORMAL LOW (ref 4.22–5.81)
RDW: 14 % (ref 11.5–15.5)
WBC: 7.1 10*3/uL (ref 4.0–10.5)
nRBC: 0 % (ref 0.0–0.2)

## 2020-09-13 LAB — BASIC METABOLIC PANEL
Anion gap: 10 (ref 5–15)
BUN: 19 mg/dL (ref 8–23)
CO2: 22 mmol/L (ref 22–32)
Calcium: 8.9 mg/dL (ref 8.9–10.3)
Chloride: 104 mmol/L (ref 98–111)
Creatinine, Ser: 1.31 mg/dL — ABNORMAL HIGH (ref 0.61–1.24)
GFR, Estimated: 58 mL/min — ABNORMAL LOW (ref >60)
Glucose, Bld: 134 mg/dL — ABNORMAL HIGH (ref 70–99)
Potassium: 4.2 mmol/L (ref 3.5–5.1)
Sodium: 136 mmol/L (ref 135–145)

## 2020-09-13 LAB — TROPONIN I (HIGH SENSITIVITY): Troponin I (High Sensitivity): 30 ng/L — ABNORMAL HIGH (ref <18)

## 2020-09-13 LAB — PROTIME-INR
INR: 2.3 — ABNORMAL HIGH (ref 0.8–1.2)
Prothrombin Time: 25.1 seconds — ABNORMAL HIGH (ref 11.4–15.2)

## 2020-09-13 MED ORDER — DILTIAZEM HCL 25 MG/5ML IV SOLN
10.0000 mg | Freq: Once | INTRAVENOUS | Status: AC
  Administered 2020-09-13: 10 mg via INTRAVENOUS
  Filled 2020-09-13: qty 5

## 2020-09-13 MED ORDER — DILTIAZEM HCL ER COATED BEADS 120 MG PO CP24: 120.0000 mg | ORAL_CAPSULE | Freq: Every day | ORAL | 0 refills | Status: DC

## 2020-09-13 MED ORDER — DILTIAZEM HCL ER COATED BEADS 120 MG PO CP24
120.0000 mg | ORAL_CAPSULE | Freq: Once | ORAL | Status: AC
  Administered 2020-09-13: 120 mg via ORAL
  Filled 2020-09-13: qty 1

## 2020-09-13 MED ORDER — SODIUM CHLORIDE 0.9 % IV BOLUS
1000.0000 mL | Freq: Once | INTRAVENOUS | Status: AC
  Administered 2020-09-13: 1000 mL via INTRAVENOUS

## 2020-09-13 NOTE — Discharge Instructions (Addendum)
You were evaluated at [facility] ED for atrial flutter (irregular heart beat with high heart rate). We would like for you to start taking the following medications:  Diltiazem (Cardizem CD) 24hr capsule '120mg'$  once daily for heart rate and rhythm control  Please call to follow up with Dr. [name] or atrial fibrillation clinic within the next two weeks (numbers provided in your paperwork)  Thank you for allowing [place] to be part of your care.

## 2020-09-13 NOTE — ED Triage Notes (Signed)
Pt states he discovered his HR to be elevated when taking his blood pressure at home. Denies CP/SOB. Pt has hx of aortic valve replacement.

## 2020-09-13 NOTE — Telephone Encounter (Signed)
Received a call from [place] PA with [place] [facility].Stated she has patient in clinic this afternoon with heart rate 152 and greater.She was calling to see if she needs to send him to ED.Advised he will need to go to ED to be evaluated.I will make Dr.Jordan aware.

## 2020-09-13 NOTE — ED Provider Notes (Signed)
Emergency Medicine Provider Triage Evaluation Note  Alexander Barton , a 70 y.o. male  was evaluated in triage.  Pt complains of palpitations and increased heart rate.  Patient states that he first noticed that his heart was "fluttering," yesterday morning.  Patient has had fluttering since then.  Patient reports that he had shortness of breath with initial onset of fluttering.  Patient denies any shortness of breath at present.  Patient denies any chest pain, abdominal pain, nausea, vomiting, leg swelling or tenderness. Patient is on Coumadin.  States that he last took Coumadin last night.  Review of Systems  Positive: Palpitations, shortness of breath Negative: chest pain, abdominal pain, nausea, vomiting, leg swelling or tenderness.  Physical Exam  BP 114/79 (BP Location: Right Arm)   Pulse (!) 119   Temp 99.3 F (37.4 C) (Oral)   Resp 16   Ht '6\' 1"'$  (1.854 m)   Wt 86.2 kg   SpO2 96%   BMI 25.07 kg/m  Gen:   Awake, no distress   Resp:  Normal effort, lungs clear to auscultation bilaterally MSK:   Moves extremities without difficulty  Other:  +2 radial pulse bilaterally, murmur noted auscultation of heart.  Medical Decision Making  Medically screening exam initiated at 4:52 PM.  Appropriate orders placed.  Alexander Barton was informed that the remainder of the evaluation will be completed by another provider, this initial triage assessment does not replace that evaluation, and the importance of remaining in the ED until their evaluation is complete.  EKG shows a flutter with RVR.  We will make patient level to move back to next available room.   Loni Beckwith, PA-C 09/13/20 1654    Drenda Freeze, MD 09/13/20 [phone]

## 2020-09-13 NOTE — Telephone Encounter (Signed)
STAT if HR is under 50 or over 120 (normal HR is 60-100 beats per minute)  What is your heart rate? 152  Do you have a log of your heart rate readings (document readings)? 150's-180 short time in 70's-80's   Do you have any other symptoms? No

## 2020-09-13 NOTE — ED Notes (Signed)
Pt states this is his normal BP at baseline

## 2020-09-13 NOTE — ED Notes (Signed)
RN went in pts room to start IV. Pt stated he needed to urinate and would like privacy. RN gave pt urinal. Wife at bedside helping pt.

## 2020-09-13 NOTE — ED Provider Notes (Signed)
[facility] EMERGENCY DEPARTMENT Provider Note   CSN: [id number] Arrival date & time: 09/13/20  1628     History Chief Complaint  Patient presents with   Irregular Heart Beat    Alexander Barton is a 70 y.o. male with PMHx of thoracic aortic aneurysm s/p repair, aortic valve disease s/p mechanical valve placement, gallstone pancreatitis, TIA, asthma who presents to [facility] ED for with elevated HR. Patient went to GI clinic at [facility] and was found to have HR to 150s and was told to present to ED. Rather than go to [facility] ED, patient preferred to go to [facility] ED since his Cardiologist Dr. [name] is with [facility] Patient reports that he feels fine, at his baseline. He noticed palpitations yesterday stating felt like his mechanical valve was moving faster than usual. Patient had short episode of SOB two days ago while outside which improved with asthma medication and has not had SOB since. Patient is unsure how long he has been having faster heart rate. He has been taking his coumadin as prescribed. He denies chest pain, current SOB, cough, fever, flu-like symptoms, abdominal pain, nausea, vomiting, focal weakness or numbness, changes in vision, difficulty speaking.    HPI     Past Medical History:  Diagnosis Date   Aortic aneurysm, thoracic ([place])    Aortic valve disease    Arthritis    BPH (benign prostatic hyperplasia)    Complication of anesthesia    goes into depression   COPD (chronic obstructive pulmonary disease) (HCC)    GERD (gastroesophageal reflux disease)    HTN (hypertension)    Hypercholesterolemia    Stroke [facility])    TIA (transient ischemic attack)     Patient Active Problem List   Diagnosis Date Noted   S/P laparoscopic cholecystectomy 04/12/2020   Globus sensation 04/09/2020   Decreased oral intake 12/02/2019   Moderate recurrent major depression ([place]) 12/02/2019   Adjustment disorder with anxiety 12/02/2019   Anxiety and depression  11/17/2019   Gallstone pancreatitis 09/21/2019   Sleep disturbance 09/21/2019   Asthma-COPD overlap syndrome ([place]) 08/26/2019   Environmental exposure 08/26/2019   Idiopathic hypoparathyroidism ([place]) 06/29/2019   PVC's (premature ventricular contractions) 06/09/2019   Pancreatic pseudocyst    Intractable abdominal pain 05/30/2019   Nausea and vomiting 05/30/2019   Hyperglycemia 05/30/2019   Pancreatic cyst    Bilateral finger numbness 01/20/2019   Headache 01/20/2019   Night sweats 01/20/2019   Educated about COVID-19 virus infection 01/05/2019   Cardiomyopathy ([place]) 01/05/2019   COVID-19 11/10/2018   Fall 05/07/2018   Costochondritis 01/04/2018   Thyroid nodule 01/04/2018   Dissecting aneurysm of thoracic aorta, Stanford type B ([place]) 11/08/2017   Anemia 11/08/2017   Hyponatremia 11/08/2017   Overweight 07/01/2017   History of stroke    GERD (gastroesophageal reflux disease)    Arthritis    Neck pain 12/01/2016   Knee osteoarthritis 12/01/2016   Physical deconditioning 06/24/2016   Dyspnea on exertion 06/23/2016   Chronic pain of right knee 12/18/2015   Chronic obstructive pulmonary disease ([place]) 11/14/2014   BPH (benign prostatic hyperplasia) 11/14/2014   Long term current use of anticoagulant therapy 10/29/2013   Type 2 diabetes mellitus without complications ([place]) 00/76/2263   TIA (transient ischemic attack) 06/03/2012   H/O aortic valve replacement 12/26/2011   History of aortic arch replacement 01/07/2011   HTN (hypertension)    Hyperlipidemia    Aortic valve disease     Past Surgical History:  Procedure Laterality Date   ACHILLES TENDON REPAIR     AORTIC VALVE REPLACEMENT     #23 On-X valve conduit   ASCENDING AORTIC ANEURYSM REPAIR     BACK SURGERY     CHOLECYSTECTOMY N/A 04/12/2020   Procedure: LAPAROSCOPIC CHOLECYSTECTOMY;  Surgeon: Coralie Keens, MD;  Location: [place];  Service: General;  Laterality: N/A;   FOOT SURGERY         Family History   Problem Relation Age of Onset   Alzheimer's disease Father    Diabetes Father        age onset DM   Hypertension Sister    Hypertension Brother    Diabetes Mother    Heart attack Sister    Diabetes Sister    Colon cancer Neg Hx     Social History   Tobacco Use   Smoking status: Never   Smokeless tobacco: Never  Substance Use Topics   Alcohol use: No    Alcohol/week: 0.0 standard drinks   Drug use: No    Home Medications Prior to Admission medications   Medication Sig Start Date End Date Taking? Authorizing Provider  acetaminophen (TYLENOL) 500 MG tablet Take 1,000 mg by mouth every 6 (six) hours as needed for mild pain.    [provider]  albuterol (VENTOLIN HFA) 108 (90 Base) MCG/ACT inhaler Inhale 2 puffs into the lungs every 4 (four) hours as needed for wheezing or shortness of breath.    [provider]  blood glucose meter kit and supplies KIT Dispense based on patient and insurance preference. Use twice daily. (FOR ICD-10 E11.9). 06/08/19   Leone Haven, MD  Budeson-Glycopyrrol-Formoterol (BREZTRI AEROSPHERE) 160-9-4.8 MCG/ACT AERO Inhale 2 puffs into the lungs in the morning and at bedtime. Patient taking differently: Inhale 2 puffs into the lungs 2 (two) times daily as needed (shortness of breath). 08/26/19   Lauraine Rinne, NP  diltiazem (CARDIZEM CD) 120 MG 24 hr capsule Take 1 capsule (120 mg total) by mouth daily. 09/13/20 09/13/21  Wayland Denis, MD  glucose blood test strip Use twice daily E11.9 07/20/19   Leone Haven, MD  Investigational - Study Medication Take 1 tablet by mouth See admin instructions. Study name: [id number] Additional study details: cholesterol [facility] [phone]- Take 1 tablet by mouth in the morning    [provider]  OLANZapine (ZYPREXA) 5 MG tablet TAKE 1 TABLET BY MOUTH EVERYDAY AT BEDTIME 09/07/20   Mozingo, Berdie Ogren, NP  OVER THE COUNTER MEDICATION Take 1 capsule by mouth See admin  instructions. Dotera Digestive Supplement capsule- Take 1 capsule by mouth in the morning    [provider]  polyethylene glycol (MIRALAX / GLYCOLAX) 17 g packet Take 17 g by mouth in the morning.    [provider]  senna (SENOKOT) 8.6 MG TABS tablet Take 2 tablets by mouth in the morning and at bedtime.    [provider]  tamsulosin (FLOMAX) 0.4 MG CAPS capsule TAKE 2 CAPSULES BY MOUTH EVERY DAY 08/20/20   Leone Haven, MD  traMADol (ULTRAM) 50 MG tablet Take 1 tablet (50 mg total) by mouth every 6 (six) hours as needed for severe pain. 04/12/20   Coralie Keens, MD  warfarin (COUMADIN) 2 MG tablet TAKE 1 TABLET WITH 5 MG TABLET DAILY AS DIRECTED BY COUMADIN CLINIC Patient taking differently: Take 2 mg by mouth every Tuesday, Thursday, Saturday, and Sunday. 12/12/19   [name], [name] M, MD  warfarin (COUMADIN) 5 MG tablet  TAKE 1 TABLET BY MOUTH DAILY AS DIRECTED WITH 2 MG TABLET 07/11/20   [name], [name] M, MD    Allergies    Codeine; Gadolinium derivatives; Hydrocodone; Iodinated diagnostic agents; Other; Statins; Antihistamines, diphenhydramine-type; Hydromorphone; Lipitor [atorvastatin calcium]; and Seroquel [quetiapine fumerate]  Review of Systems   Review of Systems  Constitutional:  Negative for fever.  HENT:  Negative for congestion, rhinorrhea and sore throat.   Eyes:  Negative for visual disturbance.  Respiratory:  Negative for cough and shortness of breath.   Cardiovascular:  Positive for palpitations. Negative for chest pain and leg swelling.  Gastrointestinal:  Negative for abdominal pain, nausea and vomiting.  Neurological:  Negative for weakness, numbness and headaches.   Physical Exam Updated Vital Signs BP 101/61   Pulse 73   Temp 99.3 F (37.4 C) (Oral)   Resp 17   Ht '6\' 1"'  (1.854 m)   Wt 86.2 kg   SpO2 94%   BMI 25.07 kg/m   Physical Exam Constitutional:      General: He is not in acute distress.    Appearance: Normal  appearance. He is normal weight. He is not ill-appearing or diaphoretic.  HENT:     Head: Normocephalic and atraumatic.     Mouth/Throat:     Mouth: Mucous membranes are moist.  Eyes:     Extraocular Movements: Extraocular movements intact.     Conjunctiva/sclera: Conjunctivae normal.  Cardiovascular:     Rate and Rhythm: Tachycardia present. Rhythm irregular.     Heart sounds: No murmur heard.   No friction rub. No gallop.     Comments: Mechanical heart valve sounds loudest RSB Pulmonary:     Effort: Pulmonary effort is normal. No respiratory distress.     Breath sounds: Normal breath sounds. No wheezing, rhonchi or rales.  Abdominal:     General: Abdomen is flat. Bowel sounds are normal. There is no distension.     Palpations: Abdomen is soft.     Tenderness: There is no abdominal tenderness.  Musculoskeletal:     Right lower leg: No edema.     Left lower leg: No edema.  Skin:    General: Skin is warm and dry.  Neurological:     General: No focal deficit present.     Mental Status: He is alert and oriented to person, place, and time. Mental status is at baseline.  Psychiatric:        Mood and Affect: Mood normal.        Behavior: Behavior normal.    ED Results / Procedures / Treatments   Labs (all labs ordered are listed, but only abnormal results are displayed) Labs Reviewed  BASIC METABOLIC PANEL - Abnormal; Notable for the following components:      Result Value   Glucose, Bld 134 (*)    Creatinine, Ser 1.31 (*)    GFR, Estimated 58 (*)    All other components within normal limits  CBC - Abnormal; Notable for the following components:   RBC 4.14 (*)    Hemoglobin 12.8 (*)    HCT 38.5 (*)    All other components within normal limits  PROTIME-INR - Abnormal; Notable for the following components:   Prothrombin Time 25.1 (*)    INR 2.3 (*)    All other components within normal limits  TROPONIN I (HIGH SENSITIVITY) - Abnormal; Notable for the following components:    Troponin I (High Sensitivity) 30 (*)    All other components within normal limits  EKG EKG Interpretation  Date/Time:  Thursday September 13 2020 16:37:06 EDT Ventricular Rate:  127 PR Interval:    QRS Duration: 94 QT Interval:  332 QTC Calculation: 482 R Axis:   36 Text Interpretation: Atrial flutter with variable A-V block Abnormal ECG aflutter new since previous Confirmed by Wandra Arthurs [phone]) on 09/13/2020 6:06:23 PM  Radiology DG Chest 2 View  Result Date: 09/13/2020 CLINICAL DATA:  Chest pain. EXAM: CHEST - 2 VIEW COMPARISON:  September 22, 2019. FINDINGS: Stable cardiomediastinal silhouette. Sternotomy wires are noted. Thoracic stent graft is unchanged in position. No pneumothorax or pleural effusion is noted. Both lungs are clear. The visualized skeletal structures are unremarkable. IMPRESSION: No active cardiopulmonary disease. Electronically Signed   By: Marijo Conception M.D.   On: 09/13/2020 18:20    Procedures Procedures No procedures performed this visit.  Medications Ordered in ED Medications  diltiazem (CARDIZEM CD) 24 hr capsule 120 mg (has no administration in time range)  sodium chloride 0.9 % bolus 1,000 mL (1,000 mLs Intravenous New Bag/Given 09/13/20 1920)  diltiazem (CARDIZEM) injection 10 mg (10 mg Intravenous Given 09/13/20 1918)    ED Course  I have reviewed the triage vital signs and the nursing notes.  Pertinent labs & imaging results that were available during my care of the patient were reviewed by me and considered in my medical decision making (see chart for details).    MDM Rules/Calculators/A&P                         ARTUR WINNINGHAM is a 70 y.o. male with PMHx of thoracic aortic aneurysm s/p repair, aortic valve disease s/p mechanical valve placement, gallstone pancreatitis, TIA, asthma who presents to [facility] ED for with irregular heartbeat. On arrival vitals with HR to 120s-130s, otherwise hemodynamically stable and afebrile. EKG showed  atrial flutter with HR 127. CBC without leukocytosis, mild anemia to 12.8. BMP without electrolyte abnormalities otherwise unremarkable. [place] Vasc 5. Troponin 30 though patient without chest pain at this time, likely demand ischemia from a flutter rather than ACS. Patient feels at baseline with incidental finding of elevated heat rate and irregular heartbeat at outside facility. Patient was started on Cardizem bolus followed by Cardizem CD. Heart rate improved to 70s, in a fib. Patient feels well and is stable for discharge. He will need close follow up with cardiology or a fib clinic. He takes coumadin and follows regularly for INR checks. He will discharge on Cardizem CD 117m once daily for rate and rhythm control.     Final Clinical Impression(s) / ED Diagnoses Final diagnoses:  Atrial flutter, unspecified type ([facility]    Rx / DC Orders ED Discharge Orders          Ordered    diltiazem (CARDIZEM CD) 120 MG 24 hr capsule  Daily,   Status:  Discontinued        09/13/20 2006    diltiazem (CARDIZEM CD) 120 MG 24 hr capsule  Daily        09/13/20 2011             ZWayland Denis MD 09/13/20 2019    YDrenda Freeze MD 09/13/20 2325

## 2020-09-14 ENCOUNTER — Ambulatory Visit: Payer: Medicare PPO | Admitting: Adult Health

## 2020-09-14 ENCOUNTER — Ambulatory Visit (INDEPENDENT_AMBULATORY_CARE_PROVIDER_SITE_OTHER): Payer: Medicare PPO | Admitting: Cardiology

## 2020-09-14 DIAGNOSIS — Z5181 Encounter for therapeutic drug level monitoring: Secondary | ICD-10-CM | POA: Diagnosis not present

## 2020-09-14 DIAGNOSIS — Z7901 Long term (current) use of anticoagulants: Secondary | ICD-10-CM

## 2020-09-14 DIAGNOSIS — I359 Nonrheumatic aortic valve disorder, unspecified: Secondary | ICD-10-CM

## 2020-09-14 LAB — POCT INR: INR: 2.5 (ref 2.0–3.0)

## 2020-09-18 ENCOUNTER — Telehealth: Payer: Self-pay | Admitting: Family Medicine

## 2020-09-18 NOTE — Telephone Encounter (Signed)
The patient's wife was in the office today.  She noted that the patient has been having issues with having bowel movements without warning.  He has been on 2 Senokot in the morning and 2 Senokot in the evening as well as MiraLAX once daily since he had his cardiac surgery.  If he does not take something to help him have a bowel movement he ends up going the opposite direction gets constipated.  They scheduled to see a GI doctor for this though they ended up getting scheduled with his pancreatic doctor.  During that visit he was diagnosed with atrial fibrillation and then advised to be seen in the emergency department for that issue.  His wife notes the patient has follow-up scheduled with the A. fib clinic though she wants to know what to do for his stools.  I advised that they stop the Senokot and continue with just MiraLAX once daily.  If this is not helpful then we can get them into see GI in [place].

## 2020-09-21 ENCOUNTER — Telehealth: Payer: Self-pay

## 2020-09-21 NOTE — Telephone Encounter (Signed)
Lpm to check INR 

## 2020-09-24 ENCOUNTER — Ambulatory Visit (INDEPENDENT_AMBULATORY_CARE_PROVIDER_SITE_OTHER): Payer: Medicare PPO | Admitting: Cardiology

## 2020-09-24 ENCOUNTER — Telehealth: Payer: Self-pay

## 2020-09-24 ENCOUNTER — Encounter (HOSPITAL_COMMUNITY): Payer: Self-pay | Admitting: Physician Assistant

## 2020-09-24 ENCOUNTER — Other Ambulatory Visit: Payer: Self-pay

## 2020-09-24 ENCOUNTER — Ambulatory Visit (HOSPITAL_COMMUNITY)
Admission: RE | Admit: 2020-09-24 | Discharge: 2020-09-24 | Disposition: A | Discharge status: Home / Self Care | Payer: Medicare PPO | Source: Ambulatory Visit | Attending: Physician Assistant | Admitting: Physician Assistant

## 2020-09-24 VITALS — BP 142/62 | HR 74 | Ht 73.0 in | Wt 190.0 lb

## 2020-09-24 DIAGNOSIS — D6869 Other thrombophilia: Secondary | ICD-10-CM | POA: Insufficient documentation

## 2020-09-24 DIAGNOSIS — Z7901 Long term (current) use of anticoagulants: Secondary | ICD-10-CM | POA: Insufficient documentation

## 2020-09-24 DIAGNOSIS — I359 Nonrheumatic aortic valve disorder, unspecified: Secondary | ICD-10-CM | POA: Diagnosis not present

## 2020-09-24 DIAGNOSIS — I480 Paroxysmal atrial fibrillation: Secondary | ICD-10-CM | POA: Diagnosis not present

## 2020-09-24 DIAGNOSIS — I483 Typical atrial flutter: Secondary | ICD-10-CM | POA: Insufficient documentation

## 2020-09-24 DIAGNOSIS — Z952 Presence of prosthetic heart valve: Secondary | ICD-10-CM | POA: Diagnosis not present

## 2020-09-24 LAB — POCT INR: INR: 3 (ref 2.0–3.0)

## 2020-09-24 MED ORDER — DILTIAZEM HCL ER COATED BEADS 120 MG PO CP24: 120.0000 mg | ORAL_CAPSULE | Freq: Every day | ORAL | 1 refills | Status: DC

## 2020-09-24 NOTE — Telephone Encounter (Signed)
Lmo for Inr self test due

## 2020-09-24 NOTE — Progress Notes (Signed)
Primary Care Physician: Leone Haven, MD Primary Cardiologist: Dr [name] Primary Electrophysiologist: none Referring Physician: [facility]   Alexander Barton is a 70 y.o. male with a history of prior CVA, s/p mechanical aortic valve 2008 with redo sternotomy ascending aortic and total arch replacement 2020, HTN, HLD, COPD, DM who presents for consultation in the [facility]. The postoperative course in 2020 was remarkable for postop afib/flutter managed with amiodarone and beta blocker- NSR at time of discharge. Patient is on warfarin for a CHADS2VASC score of 5. Patient was incidentally found to have an irregular, elevated heart beat at his GI appointment. He was instructed to go to the ED. Patient reports that he did have some mild SOB but otherwise no symptoms. He did note his mechanical valve was clicking much faster. He was rate controlled with diltiazem and discharged. Patient reports that about one day later he was back in SR with a regular heart beat. He denies significant snoring or alcohol use. He does report he had been dealing with significant diarrhea around the time of his afib. This has resolved. He is in SR today.   Today, he denies symptoms of palpitations, chest pain, shortness of breath, orthopnea, PND, lower extremity edema, dizziness, presyncope, syncope, snoring, daytime somnolence, bleeding, or neurologic sequela. The patient is tolerating medications without difficulties and is otherwise without complaint today.    Atrial Fibrillation Risk Factors:  he does not have symptoms or diagnosis of sleep apnea. he does not have a history of rheumatic fever.   he has a BMI of Body mass index is 25.07 kg/m.Marland Kitchen Filed Weights   09/24/20 1028  Weight: 86.2 kg    Family History  Problem Relation Age of Onset   Alzheimer's disease Father    Diabetes Father        age onset DM   Hypertension Sister    Hypertension Brother    Diabetes  Mother    Heart attack Sister    Diabetes Sister    Colon cancer Neg Hx      Atrial Fibrillation Management history:  Previous antiarrhythmic drugs: amiodarone  Previous cardioversions: none Previous ablations: none CHADS2VASC score: 5 Anticoagulation history: warfarin    Past Medical History:  Diagnosis Date   Aortic aneurysm, thoracic (HCC)    Aortic valve disease    Arthritis    BPH (benign prostatic hyperplasia)    Complication of anesthesia    goes into depression   COPD (chronic obstructive pulmonary disease) (HCC)    GERD (gastroesophageal reflux disease)    HTN (hypertension)    Hypercholesterolemia    Stroke ([place])    TIA (transient ischemic attack)    Past Surgical History:  Procedure Laterality Date   ACHILLES TENDON REPAIR     AORTIC VALVE REPLACEMENT     #23 On-X valve conduit   ASCENDING AORTIC ANEURYSM REPAIR     BACK SURGERY     CHOLECYSTECTOMY N/A 04/12/2020   Procedure: LAPAROSCOPIC CHOLECYSTECTOMY;  Surgeon: Coralie Keens, MD;  Location: [facility];  Service: General;  Laterality: N/A;   FOOT SURGERY      Current Outpatient Medications  Medication Sig Dispense Refill   acetaminophen (TYLENOL) 500 MG tablet Take 1,000 mg by mouth every 6 (six) hours as needed for mild pain.     albuterol (VENTOLIN HFA) 108 (90 Base) MCG/ACT inhaler Inhale 2 puffs into the lungs every 4 (four) hours as needed for wheezing or shortness of breath.  blood glucose meter kit and supplies KIT Dispense based on patient and insurance preference. Use twice daily. (FOR ICD-10 E11.9). 1 each 0   Budeson-Glycopyrrol-Formoterol (BREZTRI AEROSPHERE) 160-9-4.8 MCG/ACT AERO Inhale 2 puffs into the lungs in the morning and at bedtime. 10.7 g 3   Cholecalciferol (VITAMIN D3) 25 MCG (1000 UT) CAPS Take by mouth.     glucose blood test strip Use twice daily E11.9 100 each 1   Investigational - Study Medication Take 1 tablet by mouth See admin instructions. Study name:  [id number] Additional study details: cholesterol [facility] [phone]- Take 1 tablet by mouth in the morning     Multiple Vitamins-Minerals (HAIR SKIN & NAILS ADVANCED PO) Take by mouth.     OLANZapine (ZYPREXA) 5 MG tablet TAKE 1 TABLET BY MOUTH EVERYDAY AT BEDTIME 90 tablet 1   OVER THE COUNTER MEDICATION Take 1 capsule by mouth See admin instructions. Dotera Digestive Supplement capsule- Take 1 capsule by mouth in the morning     polyethylene glycol (MIRALAX / GLYCOLAX) 17 g packet Take 17 g by mouth in the morning.     senna (SENOKOT) 8.6 MG TABS tablet Take 2 tablets by mouth in the morning and at bedtime.     tamsulosin (FLOMAX) 0.4 MG CAPS capsule TAKE 2 CAPSULES BY MOUTH EVERY DAY 174 capsule 1   vitamin B-12 (CYANOCOBALAMIN) 100 MCG tablet Take 100 mcg by mouth daily.     warfarin (COUMADIN) 2 MG tablet TAKE 1 TABLET WITH 5 MG TABLET DAILY AS DIRECTED BY COUMADIN CLINIC 90 tablet 0   warfarin (COUMADIN) 5 MG tablet TAKE 1 TABLET BY MOUTH DAILY AS DIRECTED WITH 2 MG TABLET 90 tablet 1   diltiazem (CARDIZEM CD) 120 MG 24 hr capsule Take 1 capsule (120 mg total) by mouth daily. 90 capsule 1   No current facility-administered medications for this encounter.    Allergies  Allergen Reactions   Codeine Anaphylaxis   Gadolinium Derivatives Other (See Comments)    Intense sneezing from MRI contrast   Hydrocodone Anaphylaxis   Iodinated Diagnostic Agents Shortness Of Breath and Other (See Comments)    Uncontrollable sneezing and "shortness of breath after prep"- needs sedative the night before and hour and benadryl before using   Other Other (See Comments)    Unnamed anesthesia- Patient is in the E.D. possibly due to the effects of it: Delirium, confusion, and sleep deprivation   Statins Other (See Comments)    Muscle pain   Antihistamines, Diphenhydramine-Type Other (See Comments)    Unable to urinate. All antihistamines.    Hydromorphone Other (See Comments)    MUSCLE PAIN    Lipitor [Atorvastatin Calcium] Other (See Comments)    Muscle pain and a "knot" appears on his right side, near the ribcage   Seroquel [Quetiapine Fumerate] Swelling and Other (See Comments)    "bad trip" and tongue became swollen    Social History   Socioeconomic History   Marital status: Married    Spouse name: Not on file   Number of children: 2   Years of education: Not on file   Highest education level: Not on file  Occupational History   Occupation: Scientist, clinical (histocompatibility and immunogenetics): [employer]  Tobacco Use   Smoking status: Never   Smokeless tobacco: Never  Substance and Sexual Activity   Alcohol use: No    Alcohol/week: 0.0 standard drinks   Drug use: No   Sexual activity: Not Currently  Other Topics Concern  Not on file  Social History Narrative   Married, 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   One son and one daughter   He lives in [place]   One caffeinated beverage daily   08/30/2014   Social Determinants of Health   Financial Resource Strain: Low Risk    Difficulty of Paying Living Expenses: Not hard at all  Food Insecurity: No Food Insecurity   Worried About Charity fundraiser in the Last Year: Never true   Arboriculturist in the Last Year: Never true  Transportation Needs: No Transportation Needs   Lack of Transportation (Medical): No   Lack of Transportation (Non-Medical): No  Physical Activity: Not on file  Stress: No Stress Concern Present   Feeling of Stress : Not at all  Social Connections: Unknown   Frequency of Communication with Friends and Family: Not on file   Frequency of Social Gatherings with Friends and Family: Not on file   Attends Religious Services: Not on Electrical engineer or Organizations: Not on file   Attends Archivist Meetings: Not on file   Marital Status: Married  Human resources officer Violence: Not At Risk   Fear of Current or Ex-Partner: No   Emotionally Abused:  No   Physically Abused: No   Sexually Abused: No     ROS- All systems are reviewed and negative except as per the HPI above.  Physical Exam: Vitals:   09/24/20 1028  BP: (!) 142/62  Pulse: 74  Weight: 86.2 kg  Height: 6' 1" (1.854 m)    GEN- The patient is a well appearing male, alert and oriented x 3 today.   Head- normocephalic, atraumatic Eyes-  Sclera clear, conjunctiva pink Ears- hearing intact Oropharynx- clear Neck- supple  Lungs- Clear to ausculation bilaterally, normal work of breathing Heart- Regular rate and rhythm, no murmurs, rubs or gallops, mech AV GI- soft, NT, ND, + BS Extremities- no clubbing, cyanosis, or edema MS- no significant deformity or atrophy Skin- no rash or lesion Psych- euthymic mood, full affect Neuro- strength and sensation are intact  Wt Readings from Last 3 Encounters:  09/24/20 86.2 kg  09/13/20 86.2 kg  04/12/20 84.8 kg    EKG today demonstrates  SR Vent. rate 74 BPM PR interval 146 ms QRS duration 90 ms QT/QTcB 382/424 ms  Echo 06/25/16 demonstrated  - Procedure narrative: Transthoracic echocardiography. The study    was technically difficult.  - Left ventricle: The cavity size was normal. There was mild    concentric hypertrophy. Systolic function was mildly reduced. The    estimated ejection fraction was in the range of 45% to 50%.    Doppler parameters are consistent with abnormal left ventricular    relaxation (grade 1 diastolic dysfunction).  - Aortic valve: A mechanical prosthesis was present and functioning    normally. Valve area (Vmax): 1.87 cm^2.  - Mitral valve: There was mild regurgitation.  - Left atrium: The atrium was moderately dilated.  - Right atrium: The atrium was mildly dilated.  - Pulmonary arteries: Systolic pressure was within the normal    range.   Epic records are reviewed at length today  CHA2DS2-VASc Score = 5  The patient's score is based upon: CHF History: No HTN History: Yes Diabetes  History: Yes Stroke History: Yes Vascular Disease History: No Age Score: 1 Gender Score: 0     ASSESSMENT AND PLAN: 1.  Paroxysmal Atrial Fibrillation/typical atrial flutter The patient's CHA2DS2-VASc score is 5, indicating a 7.2% annual risk of stroke.   Patient back in SR with addition of diltiazem. Could consider AAD if his afib becomes more persistent.  Continue diltiazem 120 mg daily Continue warfarin  2. Secondary Hypercoagulable State (ICD10:  D68.69) The patient is at significant risk for stroke/thromboembolism based upon his CHA2DS2-VASc Score of 5.  Continue Warfarin (Coumadin).   3. S/p aortic MVR On warfarin  4. HTN Stable, no changes today.   Follow up in the AF clinic in 3 months.    [facility] [address] [place], [place] [address] [phone] 09/24/2020 12:01 PM

## 2020-09-24 NOTE — Patient Instructions (Signed)
Description   Spoke with pt and instructed to only take 1 tablet ('5mg'$ ) tonight and eat some greens and then continue taking 10 mg daily except 7 mg each Sunday, Tuesday and Thursday.  Repeat INR in 1 week.

## 2020-10-01 ENCOUNTER — Ambulatory Visit (INDEPENDENT_AMBULATORY_CARE_PROVIDER_SITE_OTHER): Payer: Medicare PPO | Admitting: Pharmacist

## 2020-10-01 DIAGNOSIS — Z7901 Long term (current) use of anticoagulants: Secondary | ICD-10-CM

## 2020-10-01 DIAGNOSIS — I359 Nonrheumatic aortic valve disorder, unspecified: Secondary | ICD-10-CM

## 2020-10-01 LAB — POCT INR: INR: 2.4 (ref 2.0–3.0)

## 2020-10-01 NOTE — Patient Instructions (Signed)
Description   Spoke with patient. Continue taking 10 mg daily except 7 mg each Sunday, Tuesday and Thursday.  Repeat INR in 2 weeks.

## 2020-10-10 ENCOUNTER — Encounter: Payer: Self-pay | Admitting: Family Medicine

## 2020-10-10 ENCOUNTER — Other Ambulatory Visit: Payer: Self-pay

## 2020-10-10 ENCOUNTER — Ambulatory Visit: Payer: Medicare PPO | Admitting: Family Medicine

## 2020-10-10 VITALS — BP 122/70 | HR 87 | Temp 98.1°F | Ht 72.99 in | Wt 185.6 lb

## 2020-10-10 DIAGNOSIS — F32A Depression, unspecified: Secondary | ICD-10-CM | POA: Diagnosis not present

## 2020-10-10 DIAGNOSIS — F419 Anxiety disorder, unspecified: Secondary | ICD-10-CM | POA: Diagnosis not present

## 2020-10-10 DIAGNOSIS — Z23 Encounter for immunization: Secondary | ICD-10-CM | POA: Diagnosis not present

## 2020-10-10 DIAGNOSIS — E119 Type 2 diabetes mellitus without complications: Secondary | ICD-10-CM

## 2020-10-10 DIAGNOSIS — E7800 Pure hypercholesterolemia, unspecified: Secondary | ICD-10-CM

## 2020-10-10 DIAGNOSIS — E041 Nontoxic single thyroid nodule: Secondary | ICD-10-CM

## 2020-10-10 DIAGNOSIS — K5900 Constipation, unspecified: Secondary | ICD-10-CM

## 2020-10-10 DIAGNOSIS — R5383 Other fatigue: Secondary | ICD-10-CM | POA: Insufficient documentation

## 2020-10-10 DIAGNOSIS — R5382 Chronic fatigue, unspecified: Secondary | ICD-10-CM

## 2020-10-10 LAB — CBC WITH DIFFERENTIAL/PLATELET
Basophils Absolute: 0 10*3/uL (ref 0.0–0.1)
Basophils Relative: 0.4 % (ref 0.0–3.0)
Eosinophils Absolute: 0.1 10*3/uL (ref 0.0–0.7)
Eosinophils Relative: 1.5 % (ref 0.0–5.0)
HCT: 40.9 % (ref 39.0–52.0)
Hemoglobin: 13.9 g/dL (ref 13.0–17.0)
Lymphocytes Relative: 29.8 % (ref 12.0–46.0)
Lymphs Abs: 1.6 10*3/uL (ref 0.7–4.0)
MCHC: 33.9 g/dL (ref 30.0–36.0)
MCV: 91.4 fl (ref 78.0–100.0)
Monocytes Absolute: 0.4 10*3/uL (ref 0.1–1.0)
Monocytes Relative: 7.4 % (ref 3.0–12.0)
Neutro Abs: 3.4 10*3/uL (ref 1.4–7.7)
Neutrophils Relative %: 60.9 % (ref 43.0–77.0)
Platelets: 207 10*3/uL (ref 150.0–400.0)
RBC: 4.48 Mil/uL (ref 4.22–5.81)
RDW: 14 % (ref 11.5–15.5)
WBC: 5.5 10*3/uL (ref 4.0–10.5)

## 2020-10-10 LAB — COMPREHENSIVE METABOLIC PANEL
ALT: 11 U/L (ref 0–53)
AST: 16 U/L (ref 0–37)
Albumin: 4.4 g/dL (ref 3.5–5.2)
Alkaline Phosphatase: 88 U/L (ref 39–117)
BUN: 20 mg/dL (ref 6–23)
CO2: 28 mEq/L (ref 19–32)
Calcium: 9.5 mg/dL (ref 8.4–10.5)
Chloride: 101 mEq/L (ref 96–112)
Creatinine, Ser: 1.26 mg/dL (ref 0.40–1.50)
GFR: 57.17 mL/min — ABNORMAL LOW (ref >60.00)
Glucose, Bld: 121 mg/dL — ABNORMAL HIGH (ref 70–99)
Potassium: 4.1 mEq/L (ref 3.5–5.1)
Sodium: 139 mEq/L (ref 135–145)
Total Bilirubin: 1 mg/dL (ref 0.2–1.2)
Total Protein: 7.5 g/dL (ref 6.0–8.3)

## 2020-10-10 LAB — LIPID PANEL
Cholesterol: 221 mg/dL — ABNORMAL HIGH (ref 0–200)
HDL: 50.1 mg/dL (ref >39.00)
LDL Cholesterol: 143 mg/dL — ABNORMAL HIGH (ref 0–99)
NonHDL: 170.75
Total CHOL/HDL Ratio: 4
Triglycerides: 141 mg/dL (ref 0.0–149.0)
VLDL: 28.2 mg/dL (ref 0.0–40.0)

## 2020-10-10 LAB — HEMOGLOBIN A1C: Hgb A1c MFr Bld: 6.6 % — ABNORMAL HIGH (ref 4.6–6.5)

## 2020-10-10 LAB — MICROALBUMIN / CREATININE URINE RATIO
Creatinine,U: 107.6 mg/dL
Microalb Creat Ratio: 0.7 mg/g (ref 0.0–30.0)
Microalb, Ur: <0.7 mg/dL (ref 0.0–1.9)

## 2020-10-10 LAB — VITAMIN D 25 HYDROXY (VIT D DEFICIENCY, FRACTURES): VITD: 26.36 ng/mL — ABNORMAL LOW (ref 30.00–100.00)

## 2020-10-10 LAB — TSH: TSH: 2.58 u[IU]/mL (ref 0.35–5.50)

## 2020-10-10 LAB — VITAMIN B12: Vitamin B-12: 369 pg/mL (ref 211–911)

## 2020-10-10 MED ORDER — LACTULOSE 10 GM/15ML PO SOLN: 20.0000 g | Freq: Every day | ORAL | 0 refills | Status: DC | PRN

## 2020-10-10 NOTE — Progress Notes (Signed)
[name], MD Phone: [phone]  Deo Mehringer Alexander Barton is a 70 y.o. male who presents today for follow-up.  Diabetes: Patient is not on medication.  He notes his blood sugar was 158 this morning.  He eats lots of vegetables and salads as well as lean meats for meals.  He does eat sandwiches.  No soda or sweet tea.  Reports he walks around the house 1 hour/day.  Does report some polyuria.  Reports he is due to see ophthalmology.  Anxiety/depression: Patient notes this comes and goes.  No SI.  He continues on Zyprexa through psychiatry.  He reports no energy since having had his aortic dissection surgery several years ago.  Constipation: The patient has an intermittent history of this throughout his life.  He has been on MiraLAX and Senokot since 2019.  He was having runny stools and significant stool urgency with this regimen so he discontinued the Senokot and only uses the MiraLAX if needed.  He goes every other day with a bowel movement.  He does have to strain and has hard stools.  No blood in his stool.  No abdominal pain.  Social History   Tobacco Use  Smoking Status Never  Smokeless Tobacco Never    Current Outpatient Medications on File Prior to Visit  Medication Sig Dispense Refill   acetaminophen (TYLENOL) 500 MG tablet Take 1,000 mg by mouth every 6 (six) hours as needed for mild pain.     albuterol (VENTOLIN HFA) 108 (90 Base) MCG/ACT inhaler Inhale 2 puffs into the lungs every 4 (four) hours as needed for wheezing or shortness of breath.     blood glucose meter kit and supplies KIT Dispense based on patient and insurance preference. Use twice daily. (FOR ICD-10 E11.9). 1 each 0   Budeson-Glycopyrrol-Formoterol (BREZTRI AEROSPHERE) 160-9-4.8 MCG/ACT AERO Inhale 2 puffs into the lungs in the morning and at bedtime. 10.7 g 3   Cholecalciferol (VITAMIN D3) 25 MCG (1000 UT) CAPS Take by mouth.     diltiazem (CARDIZEM CD) 120 MG 24 hr capsule Take 1 capsule (120 mg total) by  mouth daily. 90 capsule 1   glucose blood test strip Use twice daily E11.9 100 each 1   Investigational - Study Medication Take 1 tablet by mouth See admin instructions. Study name: [id number] Additional study details: cholesterol [facility] [phone]- Take 1 tablet by mouth in the morning     Multiple Vitamins-Minerals (HAIR SKIN & NAILS ADVANCED PO) Take by mouth.     OLANZapine (ZYPREXA) 5 MG tablet TAKE 1 TABLET BY MOUTH EVERYDAY AT BEDTIME 90 tablet 1   OVER THE COUNTER MEDICATION Take 1 capsule by mouth See admin instructions. Dotera Digestive Supplement capsule- Take 1 capsule by mouth in the morning     polyethylene glycol (MIRALAX / GLYCOLAX) 17 g packet Take 17 g by mouth in the morning.     senna (SENOKOT) 8.6 MG TABS tablet Take 2 tablets by mouth in the morning and at bedtime.     tamsulosin (FLOMAX) 0.4 MG CAPS capsule TAKE 2 CAPSULES BY MOUTH EVERY DAY 174 capsule 1   vitamin B-12 (CYANOCOBALAMIN) 100 MCG tablet Take 100 mcg by mouth daily.     warfarin (COUMADIN) 2 MG tablet TAKE 1 TABLET WITH 5 MG TABLET DAILY AS DIRECTED BY COUMADIN CLINIC 90 tablet 0   warfarin (COUMADIN) 5 MG tablet TAKE 1 TABLET BY MOUTH DAILY AS DIRECTED WITH 2 MG TABLET 90 tablet 1   No current facility-administered medications on  file prior to visit.     ROS see history of present illness  Objective  Physical Exam Vitals:   10/10/20 1005  BP: 122/70  Pulse: 87  Temp: 98.1 F (36.7 C)  SpO2: 96%    BP Readings from Last 3 Encounters:  10/10/20 122/70  09/24/20 (!) 142/62  09/13/20 (!) 94/56   Wt Readings from Last 3 Encounters:  10/10/20 185 lb 9.6 oz (84.2 kg)  09/24/20 190 lb (86.2 kg)  09/13/20 190 lb (86.2 kg)    Physical Exam Constitutional:      General: He is not in acute distress.    Appearance: He is not diaphoretic.  Cardiovascular:     Rate and Rhythm: Normal rate and regular rhythm.     Heart sounds: Normal heart sounds.  Pulmonary:     Effort: Pulmonary  effort is normal.     Breath sounds: Normal breath sounds.  Abdominal:     General: Bowel sounds are normal. There is no distension.     Palpations: Abdomen is soft.     Tenderness: There is no abdominal tenderness. There is no guarding or rebound.  Skin:    General: Skin is warm and dry.  Neurological:     Mental Status: He is alert.     Assessment/Plan: Please see individual problem list.  Problem List Items Addressed This Visit     Anxiety and depression    Continues to be an issue though has improved significantly.  He will continue management through psychiatry.  Discussed that this could be contributing to his decreased energy level.      Constipation    This has been a chronic ongoing issue for the patient.  Discussed a trial of lactulose followed by MiraLAX once daily after he has had good bowel movements with the lactulose.  Colonoscopy was done in 2016 with 10-year recall.      Relevant Medications   lactulose (CHRONULAC) 10 GM/15ML solution   Fatigue    Chronic ongoing issue.  Possibly related to his anxiety and depression that could be related to a number of other issues.  We will check lab work as outlined for this.      Relevant Orders   TSH   CBC w/Diff   Vitamin D (25 hydroxy)   B12   Hyperlipidemia    The patient is no longer on the study for his cholesterol.  We will recheck today to determine the need for cholesterol medication.      Relevant Orders   Comp Met (CMET)   Lipid panel   Thyroid nodule    No further follow-up recommended on most recent ultrasound.      Type 2 diabetes mellitus without complications ([place]) - Primary    This has been diet controlled in the past.  We will check an A1c today.  I encouraged him to see his ophthalmologist yearly.      Relevant Orders   HgB A1c   Urine Microalbumin w/creat. ratio    Return in about 3 months (around 01/09/2021).  This visit occurred during the SARS-CoV-2 public health emergency.  Safety  protocols were in place, including screening questions prior to the visit, additional usage of staff PPE, and extensive cleaning of exam room while observing appropriate contact time as indicated for disinfecting solutions.    [name], MD [place]

## 2020-10-10 NOTE — Assessment & Plan Note (Signed)
Chronic ongoing issue.  Possibly related to his anxiety and depression that could be related to a number of other issues.  We will check lab work as outlined for this.

## 2020-10-10 NOTE — Assessment & Plan Note (Signed)
The patient is no longer on the study for his cholesterol.  We will recheck today to determine the need for cholesterol medication.

## 2020-10-10 NOTE — Addendum Note (Signed)
Addended by: Baron Hamper on: 10/10/2020 01:50 PM   Modules accepted: Orders

## 2020-10-10 NOTE — Assessment & Plan Note (Signed)
This has been diet controlled in the past.  We will check an A1c today.  I encouraged him to see his ophthalmologist yearly.

## 2020-10-10 NOTE — Assessment & Plan Note (Signed)
Continues to be an issue though has improved significantly.  He will continue management through psychiatry.  Discussed that this could be contributing to his decreased energy level.

## 2020-10-10 NOTE — Assessment & Plan Note (Signed)
No further follow-up recommended on most recent ultrasound.

## 2020-10-10 NOTE — Assessment & Plan Note (Signed)
This has been a chronic ongoing issue for the patient.  Discussed a trial of lactulose followed by MiraLAX once daily after he has had good bowel movements with the lactulose.  Colonoscopy was done in 2016 with 10-year recall.

## 2020-10-10 NOTE — Patient Instructions (Addendum)
Nice to see you. We will check lab work today. Please try the lactulose once daily in the morning to help with your constipation.  Please do this for 3 to 4 days to see if it helps with a good bowel movement.  Once you have had a good bowel movement with the lactulose you can start on the MiraLAX once daily in the morning.  If you have any issues with this regimen please let us know.

## 2020-10-15 ENCOUNTER — Ambulatory Visit (INDEPENDENT_AMBULATORY_CARE_PROVIDER_SITE_OTHER): Payer: Medicare PPO | Admitting: Cardiology

## 2020-10-15 DIAGNOSIS — Z5181 Encounter for therapeutic drug level monitoring: Secondary | ICD-10-CM | POA: Diagnosis not present

## 2020-10-15 DIAGNOSIS — I359 Nonrheumatic aortic valve disorder, unspecified: Secondary | ICD-10-CM | POA: Diagnosis not present

## 2020-10-15 DIAGNOSIS — Z7901 Long term (current) use of anticoagulants: Secondary | ICD-10-CM | POA: Diagnosis not present

## 2020-10-15 LAB — POCT INR: INR: 2.4 (ref 2.0–3.0)

## 2020-10-23 ENCOUNTER — Other Ambulatory Visit: Payer: Self-pay | Admitting: Family Medicine

## 2020-10-23 ENCOUNTER — Telehealth: Payer: Self-pay

## 2020-10-23 DIAGNOSIS — K5900 Constipation, unspecified: Secondary | ICD-10-CM

## 2020-10-23 DIAGNOSIS — E7800 Pure hypercholesterolemia, unspecified: Secondary | ICD-10-CM

## 2020-10-23 MED ORDER — EZETIMIBE 10 MG PO TABS: 10.0000 mg | ORAL_TABLET | Freq: Every day | ORAL | 1 refills | Status: DC

## 2020-10-23 NOTE — Telephone Encounter (Signed)
Lab ordered for LDL for future labs and Zetia 10 mg sent into pharmacy.

## 2020-10-31 ENCOUNTER — Encounter: Payer: Self-pay | Admitting: Family Medicine

## 2020-11-02 ENCOUNTER — Ambulatory Visit (INDEPENDENT_AMBULATORY_CARE_PROVIDER_SITE_OTHER): Payer: Medicare PPO | Admitting: Cardiology

## 2020-11-02 ENCOUNTER — Telehealth: Payer: Self-pay

## 2020-11-02 DIAGNOSIS — I359 Nonrheumatic aortic valve disorder, unspecified: Secondary | ICD-10-CM

## 2020-11-02 DIAGNOSIS — Z5181 Encounter for therapeutic drug level monitoring: Secondary | ICD-10-CM

## 2020-11-02 DIAGNOSIS — Z7901 Long term (current) use of anticoagulants: Secondary | ICD-10-CM

## 2020-11-02 LAB — POCT INR: INR: 2.9 (ref 2.0–3.0)

## 2020-11-02 NOTE — Telephone Encounter (Signed)
Lpm to check INR 

## 2020-11-03 ENCOUNTER — Other Ambulatory Visit: Payer: Self-pay | Admitting: Family Medicine

## 2020-11-03 DIAGNOSIS — K5900 Constipation, unspecified: Secondary | ICD-10-CM

## 2020-11-03 MED ORDER — LACTULOSE 10 GM/15ML PO SOLN: 20.0000 g | Freq: Every day | ORAL | 0 refills | Status: DC | PRN

## 2020-11-06 ENCOUNTER — Other Ambulatory Visit: Payer: Self-pay | Admitting: Family Medicine

## 2020-11-06 DIAGNOSIS — K5900 Constipation, unspecified: Secondary | ICD-10-CM

## 2020-11-14 DIAGNOSIS — E119 Type 2 diabetes mellitus without complications: Secondary | ICD-10-CM | POA: Diagnosis not present

## 2020-11-14 DIAGNOSIS — D6869 Other thrombophilia: Secondary | ICD-10-CM | POA: Diagnosis not present

## 2020-11-14 DIAGNOSIS — F419 Anxiety disorder, unspecified: Secondary | ICD-10-CM | POA: Diagnosis not present

## 2020-11-14 DIAGNOSIS — E785 Hyperlipidemia, unspecified: Secondary | ICD-10-CM | POA: Diagnosis not present

## 2020-11-14 DIAGNOSIS — F324 Major depressive disorder, single episode, in partial remission: Secondary | ICD-10-CM | POA: Diagnosis not present

## 2020-11-14 DIAGNOSIS — I4891 Unspecified atrial fibrillation: Secondary | ICD-10-CM | POA: Diagnosis not present

## 2020-11-14 DIAGNOSIS — I951 Orthostatic hypotension: Secondary | ICD-10-CM | POA: Diagnosis not present

## 2020-11-14 DIAGNOSIS — G8929 Other chronic pain: Secondary | ICD-10-CM | POA: Diagnosis not present

## 2020-11-14 DIAGNOSIS — J45909 Unspecified asthma, uncomplicated: Secondary | ICD-10-CM | POA: Diagnosis not present

## 2020-11-16 ENCOUNTER — Telehealth: Payer: Self-pay | Admitting: Family Medicine

## 2020-11-16 ENCOUNTER — Ambulatory Visit (INDEPENDENT_AMBULATORY_CARE_PROVIDER_SITE_OTHER): Payer: Medicare PPO | Admitting: Internal Medicine

## 2020-11-16 DIAGNOSIS — I359 Nonrheumatic aortic valve disorder, unspecified: Secondary | ICD-10-CM | POA: Diagnosis not present

## 2020-11-16 DIAGNOSIS — Z7901 Long term (current) use of anticoagulants: Secondary | ICD-10-CM

## 2020-11-16 DIAGNOSIS — Z5181 Encounter for therapeutic drug level monitoring: Secondary | ICD-10-CM

## 2020-11-16 LAB — POCT INR: INR: 2.7 (ref 2.0–3.0)

## 2020-11-16 NOTE — Telephone Encounter (Signed)
Call patient, wife Concern for fecal impaction and/or abdominal ileus.  Please advise ofsafety, and after 4 days of not having BM, he needs to be seen in urgent care for likely imaging as well as physical exam of abdomen.  In particular if lactulose has not resulted in bowel movement

## 2020-11-16 NOTE — Telephone Encounter (Signed)
Patient and wife calling in and states the Patient was having ongoing Constipation. States he was given Lactulose by Dr [name]. Took the first does last night at 7:00 pm. Patient had abdominal cramps off and on all last night rated 9/10. Patient then had a small bowel movement this morning with a lot of straining. No blood when wiping with brown stool, not dark.    Patient scheduled to see Dr Allen Norris the [facility] provider in a month at there earliest. Wanting to know what to do?    Patient also took miralax with the lactulose. Patient had bowel movements Saturday and Monday the first time he did this combination. No Bowel movement from Monday 10/10 to Today 10/14.   Jann Ra,cma

## 2020-11-16 NOTE — Telephone Encounter (Signed)
Patient and wife calling in and states the Patient was having ongoing Constipation. States he was given Lactulose by Dr [name]. Took the first does last night at 7:00 pm. Patient had abdominal cramps off and on all last night rated 9/10. Patient then had a small bowel movement this morning with a lot of straining. No blood when wiping with brown stool, not dark.   Patient scheduled to see Dr Allen Norris the [facility] provider in a month at there earliest. Wanting to know what to do?   Patient also took miralax with the lactulose. Patient had bowel movements Saturday and Monday the first time he did this combination. No Bowel movement from Monday 10/10 to Today 10/14.    Please advise

## 2020-11-22 MED ORDER — LACTULOSE 10 GM/15ML PO SOLN: 20.0000 g | Freq: Every day | ORAL | 0 refills | Status: DC | PRN

## 2020-11-22 NOTE — Addendum Note (Signed)
Addended by: [name], Yifan Auker G on: 11/22/2020 10:15 AM   Modules accepted: Orders

## 2020-11-23 MED ORDER — LINACLOTIDE 72 MCG PO CAPS: 72.0000 ug | ORAL_CAPSULE | Freq: Every day | ORAL | 2 refills | Status: DC

## 2020-11-23 NOTE — Telephone Encounter (Signed)
Sent to pharmacy 

## 2020-11-23 NOTE — Addendum Note (Signed)
Addended by: [name], Lucill Mauck G on: 11/23/2020 01:59 PM   Modules accepted: Orders

## 2020-11-29 ENCOUNTER — Other Ambulatory Visit: Payer: Self-pay | Admitting: Family Medicine

## 2020-11-29 ENCOUNTER — Telehealth: Payer: Self-pay

## 2020-11-29 NOTE — Telephone Encounter (Signed)
He should stop the Linzess at this point.  He should hold off on the MiraLAX until his stools normalize.  If he notices any blood or continues to have any discomfort or black stools after stopping the Linzess he needs to let us know.  At this point he needs to see GI to get their input.

## 2020-11-29 NOTE — Telephone Encounter (Signed)
The patient is taking Linzess ,however he is having watery diarrhea with an extremely awful smell. Patient wants to know what to do next.

## 2020-11-29 NOTE — Telephone Encounter (Signed)
I have created my note after speaking with patient & sent to Dr. [name] as well as doc of the day.

## 2020-11-29 NOTE — Telephone Encounter (Signed)
FYI spoke with patient & he will stop the Linzess for now. He will monitor stool for blood, black stool or anything abnormal. He will let us know if he does see anything & he will let us know if he feels he needs the Miralax so that we can advise him on doing/frequency etc.

## 2020-11-29 NOTE — Telephone Encounter (Signed)
Pt called and stated that he is having awful diarrhea after taking this medication. Stool looks blackish/charcoal like, like is has mucus in it, very watery. He stated that the smell is so foul that he has to flush immediately. This goes on for a few hours after taking. Anywhere from 2-3 BM's at a time then subside. When pain hits he has to go or will go to the bathroom on himself. Pt unsure of stool looks tarry bc he has to flush so quickly due to smell. Pt started Linzess on Saturday & this has happened daily since. Pt asked if instead can he take the Miralax. I told him I would ask for advice on frequency & dosing of this, but was safe. I told him not to take again, but he has already taken today anyway. I know this medication can be harsh, but no issues like this with color prior to  med just chronic constipation. Pt does see GI 11/14. To make sure this is seen will send to doc of the day.

## 2020-11-30 ENCOUNTER — Ambulatory Visit (INDEPENDENT_AMBULATORY_CARE_PROVIDER_SITE_OTHER): Payer: Medicare PPO | Admitting: Cardiovascular Disease

## 2020-11-30 ENCOUNTER — Telehealth: Payer: Self-pay

## 2020-11-30 DIAGNOSIS — I359 Nonrheumatic aortic valve disorder, unspecified: Secondary | ICD-10-CM | POA: Diagnosis not present

## 2020-11-30 DIAGNOSIS — Z7901 Long term (current) use of anticoagulants: Secondary | ICD-10-CM

## 2020-11-30 DIAGNOSIS — Z5181 Encounter for therapeutic drug level monitoring: Secondary | ICD-10-CM | POA: Diagnosis not present

## 2020-11-30 LAB — POCT INR: INR: 2.2 (ref 2.0–3.0)

## 2020-11-30 NOTE — Telephone Encounter (Signed)
Called Alexander Barton to ask about a medicatoin refill. Pt states that he has already received his Lactulose and that he was taken off of the medication.

## 2020-12-03 NOTE — Telephone Encounter (Signed)
Last Thursday, Alexander Barton called Alexander Barton checking up on him.  He told her he was continuing to have diarrhea after taking the Linzess.  She consulted with you and advised him to stop taking everything and let you know when a normal BM returned.  To date this had not happened.  My concern is that he is headed to an impaction. Could I give him two miralax doses tonight and see where this will lead?  Once again, we need some help.

## 2020-12-04 ENCOUNTER — Other Ambulatory Visit: Payer: Self-pay

## 2020-12-04 ENCOUNTER — Other Ambulatory Visit (INDEPENDENT_AMBULATORY_CARE_PROVIDER_SITE_OTHER): Payer: Medicare PPO

## 2020-12-04 DIAGNOSIS — E7800 Pure hypercholesterolemia, unspecified: Secondary | ICD-10-CM

## 2020-12-04 LAB — LDL CHOLESTEROL, DIRECT: Direct LDL: 135 mg/dL

## 2020-12-11 ENCOUNTER — Other Ambulatory Visit: Payer: Self-pay | Admitting: Family Medicine

## 2020-12-14 ENCOUNTER — Ambulatory Visit (INDEPENDENT_AMBULATORY_CARE_PROVIDER_SITE_OTHER): Payer: Medicare PPO | Admitting: Pharmacist Clinician (PhC)/ Clinical Pharmacy Specialist

## 2020-12-14 DIAGNOSIS — Z952 Presence of prosthetic heart valve: Secondary | ICD-10-CM

## 2020-12-14 DIAGNOSIS — Z7901 Long term (current) use of anticoagulants: Secondary | ICD-10-CM

## 2020-12-14 DIAGNOSIS — I359 Nonrheumatic aortic valve disorder, unspecified: Secondary | ICD-10-CM | POA: Diagnosis not present

## 2020-12-14 LAB — POCT INR: INR: 1.8 — AB (ref 2.0–3.0)

## 2020-12-17 ENCOUNTER — Ambulatory Visit (INDEPENDENT_AMBULATORY_CARE_PROVIDER_SITE_OTHER): Payer: Medicare PPO | Admitting: Gastroenterology

## 2020-12-17 ENCOUNTER — Encounter: Payer: Self-pay | Admitting: Gastroenterology

## 2020-12-17 ENCOUNTER — Other Ambulatory Visit: Payer: Self-pay

## 2020-12-17 VITALS — BP 99/63 | HR 80 | Temp 97.5°F | Ht 72.0 in | Wt 190.0 lb

## 2020-12-17 DIAGNOSIS — K5904 Chronic idiopathic constipation: Secondary | ICD-10-CM

## 2020-12-17 NOTE — Progress Notes (Signed)
Gastroenterology Consultation  Referring Provider:     Leone Haven, MD Primary Care Physician:  Leone Haven, MD Primary Gastroenterologist:  Dr. Allen Norris     Reason for Consultation:     Constipation        HPI:   Alexander Barton is a 70 y.o. y/o male referred for consultation & management of constipation by Dr. [name], Angela Adam, MD. This patient comes in today after being seen in the past by Dr. Arelia Longest in [place] and had a colonoscopy in 2016 with a normal colonoscopy at that time it was recommended for consideration for another colonoscopy in 10 years from then.  The patient was recently seen by multiple gastroenterologist at [facility] for gallstone pancreatitis and diarrhea. He was told to stop the Senokot and MiraLAX.  The result was that the patient got constipation. The patient has been tried on lactulose and Linzess.  At the time of the patient's last visit to his gastroenterologist at [facility] in August of this year the patient had a rapid heart rate and was recommended by his cardiologist to go to the emergency room.  On 10/14 the patient had contacted his primary care provider and there was some concern for fecal impaction versus ileus due to the patient not having any bowel movements. He was on Senokot and Miramax since the heart surgery and then stopped that and got constipation which is going on mow. He now takes Miralax once a day. He was taking double dose of Miralax and had loose stools and then went to once a night and has no bowel movements for the last few days.   Past Medical History:  Diagnosis Date   Aortic aneurysm, thoracic (HCC)    Aortic valve disease    Arthritis    BPH (benign prostatic hyperplasia)    Complication of anesthesia    goes into depression   COPD (chronic obstructive pulmonary disease) (HCC)    GERD (gastroesophageal reflux disease)    HTN (hypertension)    Hypercholesterolemia    Stroke (HCC)    TIA (transient  ischemic attack)     Past Surgical History:  Procedure Laterality Date   ACHILLES TENDON REPAIR     AORTIC VALVE REPLACEMENT     #23 On-X valve conduit   ASCENDING AORTIC ANEURYSM REPAIR     BACK SURGERY     CHOLECYSTECTOMY N/A 04/12/2020   Procedure: LAPAROSCOPIC CHOLECYSTECTOMY;  Surgeon: Coralie Keens, MD;  Location: [facility];  Service: General;  Laterality: N/A;   FOOT SURGERY      Prior to Admission medications   Medication Sig Start Date End Date Taking? Authorizing Provider  acetaminophen (TYLENOL) 500 MG tablet Take 1,000 mg by mouth every 6 (six) hours as needed for mild pain.    [provider]  albuterol (VENTOLIN HFA) 108 (90 Base) MCG/ACT inhaler Inhale 2 puffs into the lungs every 4 (four) hours as needed for wheezing or shortness of breath.    [provider]  blood glucose meter kit and supplies KIT Dispense based on patient and insurance preference. Use twice daily. (FOR ICD-10 E11.9). 06/08/19   Leone Haven, MD  Budeson-Glycopyrrol-Formoterol (BREZTRI AEROSPHERE) 160-9-4.8 MCG/ACT AERO Inhale 2 puffs into the lungs in the morning and at bedtime. 08/26/19   Lauraine Rinne, NP  Cholecalciferol (VITAMIN D3) 25 MCG (1000 UT) CAPS Take by mouth.    [provider]  diltiazem (CARDIZEM CD) 120 MG 24 hr capsule Take 1  capsule (120 mg total) by mouth daily. 09/24/20 09/24/21  Fenton, Clint R, PA  ezetimibe (ZETIA) 10 MG tablet Take 1 tablet (10 mg total) by mouth daily. 10/23/20   Leone Haven, MD  glucose blood test strip Use twice daily E11.9 07/20/19   Leone Haven, MD  Investigational - Study Medication Take 1 tablet by mouth See admin instructions. Study name: [id number] Additional study details: cholesterol [facility] [phone]- Take 1 tablet by mouth in the morning    [provider]  lactulose (CHRONULAC) 10 GM/15ML solution TAKE 30 MLS (20 G TOTAL) BY MOUTH DAILY AS NEEDED FOR MILD CONSTIPATION. 11/30/20    Leone Haven, MD  linaclotide Rolan Lipa) 72 MCG capsule Take 1 capsule (72 mcg total) by mouth daily before breakfast. 11/23/20   Leone Haven, MD  Multiple Vitamins-Minerals (HAIR SKIN & NAILS ADVANCED PO) Take by mouth.    [provider]  OLANZapine (ZYPREXA) 5 MG tablet TAKE 1 TABLET BY MOUTH EVERYDAY AT BEDTIME 09/07/20   Mozingo, Berdie Ogren, NP  OVER THE COUNTER MEDICATION Take 1 capsule by mouth See admin instructions. Dotera Digestive Supplement capsule- Take 1 capsule by mouth in the morning    [provider]  polyethylene glycol (MIRALAX / GLYCOLAX) 17 g packet Take 17 g by mouth in the morning.    [provider]  senna (SENOKOT) 8.6 MG TABS tablet Take 2 tablets by mouth in the morning and at bedtime.    [provider]  tamsulosin (FLOMAX) 0.4 MG CAPS capsule TAKE 2 CAPSULES BY MOUTH EVERY DAY 08/20/20   Leone Haven, MD  vitamin B-12 (CYANOCOBALAMIN) 100 MCG tablet Take 100 mcg by mouth daily.    [provider]  warfarin (COUMADIN) 2 MG tablet TAKE 1 TABLET WITH 5 MG TABLET DAILY AS DIRECTED BY COUMADIN CLINIC 12/12/19   [name], [name] M, MD  warfarin (COUMADIN) 5 MG tablet TAKE 1 TABLET BY MOUTH DAILY AS DIRECTED WITH 2 MG TABLET 07/11/20   [name], [name] M, MD    Family History  Problem Relation Age of Onset   Alzheimer's disease Father    Diabetes Father        age onset DM   Hypertension Sister    Hypertension Brother    Diabetes Mother    Heart attack Sister    Diabetes Sister    Colon cancer Neg Hx      Social History   Tobacco Use   Smoking status: Never   Smokeless tobacco: Never  Substance Use Topics   Alcohol use: No    Alcohol/week: 0.0 standard drinks   Drug use: No    Allergies as of 12/17/2020 - Review Complete 10/10/2020  Allergen Reaction Noted   Codeine Anaphylaxis 12/06/2010   Gadolinium derivatives Other (See Comments) 06/11/2017   Hydrocodone Anaphylaxis 12/06/2010   Iodinated  diagnostic agents Shortness Of Breath and Other (See Comments) 12/06/2010   Other Other (See Comments) 12/06/2010   Statins Other (See Comments) 12/10/2010   Antihistamines, diphenhydramine-type Other (See Comments) 12/02/2019   Hydromorphone Other (See Comments) 12/10/2010   Lipitor [atorvastatin calcium] Other (See Comments) 12/10/2010   Seroquel [quetiapine fumerate] Swelling and Other (See Comments) 12/06/2010    Review of Systems:    All systems reviewed and negative except where noted in HPI.   Physical Exam:  There were no vitals taken for this visit. No LMP for male patient. General:   Alert,  Well-developed, well-nourished, pleasant and cooperative in NAD Head:  Normocephalic and atraumatic. Eyes:  Sclera clear, no icterus.   Conjunctiva pink. Ears:  Normal auditory acuity. Neck:  Supple; no masses or thyromegaly. Lungs:  Respirations even and unlabored.  Clear throughout to auscultation.   No wheezes, crackles, or rhonchi. No acute distress. Heart:  Regular rate and rhythm; no murmurs, positive click, rubs, or gallops. Abdomen:  Normal bowel sounds.  No bruits.  Soft, non-tender and non-distended without masses, hepatosplenomegaly or hernias noted.  No guarding or rebound tenderness.  Negative Carnett sign.   Rectal:  Deferred.  Pulses:  Normal pulses noted. Extremities:  No clubbing or edema.  No cyanosis. Neurologic:  Alert and oriented x3;  grossly normal neurologically. Skin:  Intact without significant lesions or rashes.  No jaundice. Lymph Nodes:  No significant cervical adenopathy. Psych:  Alert and cooperative. Normal mood and affect.  Imaging Studies: No results found.  Assessment and Plan:   JAHIEM FRANZONI is a 70 y.o. y/o male Who comes in with a history of chronic constipation.  The patient's wife come to the patient states that his constipation has been changing his mood and he does not do well constipated.  The patient has been told to titrate his  MiraLAX so that he is not constipated and not having diarrhea.  His also been told to start Citrucel and at the 2 of these do not work well together he will then add prune juice.  The patient states that he would like to stay on things that are more natural.  The patient and his wife have been explained the plan and agree with it.    Lucilla Lame, MD. Marval Regal    Note: This dictation was prepared with Dragon dictation along with smaller phrase technology. Any transcriptional errors that result from this process are unintentional.

## 2020-12-18 ENCOUNTER — Telehealth: Payer: Self-pay

## 2020-12-18 DIAGNOSIS — E785 Hyperlipidemia, unspecified: Secondary | ICD-10-CM

## 2020-12-18 MED ORDER — PRAVASTATIN SODIUM 10 MG PO TABS: 10.0000 mg | ORAL_TABLET | Freq: Every day | ORAL | 1 refills | Status: DC

## 2020-12-18 NOTE — Telephone Encounter (Signed)
I sent pravastatin 10 mg and for him to take once daily.  He needs labs in 6 weeks.  Orders placed.

## 2020-12-25 ENCOUNTER — Encounter (HOSPITAL_COMMUNITY): Payer: Self-pay | Admitting: Physician Assistant

## 2020-12-25 ENCOUNTER — Ambulatory Visit (HOSPITAL_COMMUNITY)
Admission: RE | Admit: 2020-12-25 | Discharge: 2020-12-25 | Disposition: A | Discharge status: Home / Self Care | Payer: Medicare PPO | Source: Ambulatory Visit | Attending: Physician Assistant | Admitting: Physician Assistant

## 2020-12-25 VITALS — BP 114/74 | HR 84 | Ht 72.0 in | Wt 186.4 lb

## 2020-12-25 DIAGNOSIS — E785 Hyperlipidemia, unspecified: Secondary | ICD-10-CM | POA: Insufficient documentation

## 2020-12-25 DIAGNOSIS — I480 Paroxysmal atrial fibrillation: Secondary | ICD-10-CM | POA: Diagnosis not present

## 2020-12-25 DIAGNOSIS — E119 Type 2 diabetes mellitus without complications: Secondary | ICD-10-CM | POA: Diagnosis not present

## 2020-12-25 DIAGNOSIS — I483 Typical atrial flutter: Secondary | ICD-10-CM | POA: Diagnosis not present

## 2020-12-25 DIAGNOSIS — Z8673 Personal history of transient ischemic attack (TIA), and cerebral infarction without residual deficits: Secondary | ICD-10-CM | POA: Insufficient documentation

## 2020-12-25 DIAGNOSIS — J449 Chronic obstructive pulmonary disease, unspecified: Secondary | ICD-10-CM | POA: Diagnosis not present

## 2020-12-25 DIAGNOSIS — D6869 Other thrombophilia: Secondary | ICD-10-CM

## 2020-12-25 DIAGNOSIS — I10 Essential (primary) hypertension: Secondary | ICD-10-CM | POA: Diagnosis not present

## 2020-12-25 DIAGNOSIS — Z952 Presence of prosthetic heart valve: Secondary | ICD-10-CM | POA: Insufficient documentation

## 2020-12-25 DIAGNOSIS — Z7901 Long term (current) use of anticoagulants: Secondary | ICD-10-CM | POA: Diagnosis not present

## 2020-12-25 MED ORDER — DILTIAZEM HCL ER COATED BEADS 120 MG PO CP24: 120.0000 mg | ORAL_CAPSULE | Freq: Every day | ORAL | 3 refills | Status: AC

## 2020-12-25 NOTE — Progress Notes (Signed)
Primary Care Physician: Leone Haven, MD Primary Cardiologist: Dr [name] Primary Electrophysiologist: none Referring Physician: [facility]   Alexander Barton is a 70 y.o. male with a history of prior CVA, s/p mechanical aortic valve 2008 with redo sternotomy ascending aortic and total arch replacement 2020, HTN, HLD, COPD, DM who presents for follow up in the [facility]. The postoperative course in 2020 was remarkable for postop afib/flutter managed with amiodarone and beta blocker- NSR at time of discharge. Patient is on warfarin for a CHADS2VASC score of 5. Patient was incidentally found to have an irregular, elevated heart beat at his GI appointment. He was instructed to go to the ED. Patient reports that he did have some mild SOB but otherwise no symptoms. He did note his mechanical valve was clicking much faster. He was rate controlled with diltiazem and discharged. Patient reports that about one day later he was back in SR with a regular heart beat. He denies significant snoring or alcohol use. He does report he had been dealing with significant diarrhea around the time of his afib.   On follow up today, patient reports that he has done well since his last visit. He did have one episode of elevated heart rate which occurred after a missed dose of diltiazem. The episode was brief. He denies any bleeding issues on anticoagulation. He does have intermittent dizziness with position changes.    Today, he denies symptoms of palpitations, chest pain, shortness of breath, orthopnea, PND, lower extremity edema, presyncope, syncope, snoring, daytime somnolence, bleeding, or neurologic sequela. The patient is tolerating medications without difficulties and is otherwise without complaint today.    Atrial Fibrillation Risk Factors:  he does not have symptoms or diagnosis of sleep apnea. he does not have a history of rheumatic fever.   he has a BMI of Body  mass index is 25.28 kg/m.Marland Kitchen Filed Weights   12/25/20 1026  Weight: 84.6 kg     Family History  Problem Relation Age of Onset   Alzheimer's disease Father    Diabetes Father        age onset DM   Hypertension Sister    Hypertension Brother    Diabetes Mother    Heart attack Sister    Diabetes Sister    Colon cancer Neg Hx      Atrial Fibrillation Management history:  Previous antiarrhythmic drugs: amiodarone  Previous cardioversions: none Previous ablations: none CHADS2VASC score: 5 Anticoagulation history: warfarin    Past Medical History:  Diagnosis Date   Aortic aneurysm, thoracic    Aortic valve disease    Arthritis    BPH (benign prostatic hyperplasia)    Complication of anesthesia    goes into depression   COPD (chronic obstructive pulmonary disease) (HCC)    GERD (gastroesophageal reflux disease)    HTN (hypertension)    Hypercholesterolemia    Stroke ([place])    TIA (transient ischemic attack)    Past Surgical History:  Procedure Laterality Date   ACHILLES TENDON REPAIR     AORTIC VALVE REPLACEMENT     #23 On-X valve conduit   ASCENDING AORTIC ANEURYSM REPAIR     BACK SURGERY     CHOLECYSTECTOMY N/A 04/12/2020   Procedure: LAPAROSCOPIC CHOLECYSTECTOMY;  Surgeon: Coralie Keens, MD;  Location: [facility];  Service: General;  Laterality: N/A;   FOOT SURGERY      Current Outpatient Medications  Medication Sig Dispense Refill   acetaminophen (TYLENOL) 500 MG  tablet Take 1,000 mg by mouth every 6 (six) hours as needed for mild pain.     albuterol (VENTOLIN HFA) 108 (90 Base) MCG/ACT inhaler Inhale 2 puffs into the lungs every 4 (four) hours as needed for wheezing or shortness of breath.     blood glucose meter kit and supplies KIT Dispense based on patient and insurance preference. Use twice daily. (FOR ICD-10 E11.9). 1 each 0   Budeson-Glycopyrrol-Formoterol (BREZTRI AEROSPHERE) 160-9-4.8 MCG/ACT AERO Inhale 2 puffs into the lungs in the morning and at  bedtime. 10.7 g 3   Cholecalciferol (VITAMIN D3) 25 MCG (1000 UT) CAPS Take by mouth.     ezetimibe (ZETIA) 10 MG tablet Take 1 tablet (10 mg total) by mouth daily. 90 tablet 1   glucose blood test strip Use twice daily E11.9 100 each 1   Multiple Vitamins-Minerals (HAIR SKIN & NAILS ADVANCED PO) Take by mouth.     OLANZapine (ZYPREXA) 5 MG tablet TAKE 1 TABLET BY MOUTH EVERYDAY AT BEDTIME 90 tablet 1   OVER THE COUNTER MEDICATION Take 1 capsule by mouth See admin instructions. Dotera Digestive Supplement capsule- Take 1 capsule by mouth in the morning     polyethylene glycol (MIRALAX / GLYCOLAX) 17 g packet Take 17 g by mouth in the morning.     pravastatin (PRAVACHOL) 10 MG tablet Take 1 tablet (10 mg total) by mouth daily. 90 tablet 1   tamsulosin (FLOMAX) 0.4 MG CAPS capsule TAKE 2 CAPSULES BY MOUTH EVERY DAY 174 capsule 1   vitamin B-12 (CYANOCOBALAMIN) 100 MCG tablet Take 100 mcg by mouth daily.     warfarin (COUMADIN) 2 MG tablet TAKE 1 TABLET WITH 5 MG TABLET DAILY AS DIRECTED BY COUMADIN CLINIC 90 tablet 0   warfarin (COUMADIN) 5 MG tablet TAKE 1 TABLET BY MOUTH DAILY AS DIRECTED WITH 2 MG TABLET 90 tablet 1   diltiazem (CARDIZEM CD) 120 MG 24 hr capsule Take 1 capsule (120 mg total) by mouth daily. 90 capsule 3   No current facility-administered medications for this encounter.    Allergies  Allergen Reactions   Codeine Anaphylaxis   Gadolinium Derivatives Other (See Comments)    Intense sneezing from MRI contrast   Hydrocodone Anaphylaxis   Iodinated Diagnostic Agents Shortness Of Breath and Other (See Comments)    Uncontrollable sneezing and "shortness of breath after prep"- needs sedative the night before and hour and benadryl before using   Other Other (See Comments)    Unnamed anesthesia- Patient is in the E.D. possibly due to the effects of it: Delirium, confusion, and sleep deprivation   Statins Other (See Comments)    Muscle pain   Antihistamines, Diphenhydramine-Type  Other (See Comments)    Unable to urinate. All antihistamines.    Hydromorphone Other (See Comments)    MUSCLE PAIN   Lipitor [Atorvastatin Calcium] Other (See Comments)    Muscle pain and a "knot" appears on his right side, near the ribcage   Seroquel [Quetiapine Fumerate] Swelling and Other (See Comments)    "bad trip" and tongue became swollen    Social History   Socioeconomic History   Marital status: Married    Spouse name: Not on file   Number of children: 2   Years of education: Not on file   Highest education level: Not on file  Occupational History   Occupation: Press photographer    Employer: [employer]  Tobacco Use   Smoking status: Never   Smokeless tobacco: Never  Substance and Sexual Activity   Alcohol use: No    Alcohol/week: 0.0 standard drinks   Drug use: No   Sexual activity: Not Currently  Other Topics Concern   Not on file  Social History Narrative   Married, 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   One son and one daughter   He lives in [place]   One caffeinated beverage daily   08/30/2014   Social Determinants of Health   Financial Resource Strain: Low Risk    Difficulty of Paying Living Expenses: Not hard at all  Food Insecurity: No Food Insecurity   Worried About Charity fundraiser in the Last Year: Never true   Arboriculturist in the Last Year: Never true  Transportation Needs: No Transportation Needs   Lack of Transportation (Medical): No   Lack of Transportation (Non-Medical): No  Physical Activity: Not on file  Stress: No Stress Concern Present   Feeling of Stress : Not at all  Social Connections: Unknown   Frequency of Communication with Friends and Family: Not on file   Frequency of Social Gatherings with Friends and Family: Not on file   Attends Religious Services: Not on Electrical engineer or Organizations: Not on file   Attends Archivist Meetings: Not on file    Marital Status: Married  Human resources officer Violence: Not At Risk   Fear of Current or Ex-Partner: No   Emotionally Abused: No   Physically Abused: No   Sexually Abused: No     ROS- All systems are reviewed and negative except as per the HPI above.  Physical Exam: Vitals:   12/25/20 1026  BP: 114/74  Pulse: 84  Weight: 84.6 kg  Height: 6' (1.829 m)    GEN- The patient is a well appearing male, alert and oriented x 3 today.   HEENT-head normocephalic, atraumatic, sclera clear, conjunctiva pink, hearing intact, trachea midline. Lungs- Clear to ausculation bilaterally, normal work of breathing Heart- Regular rate and rhythm, no murmurs, rubs or gallops  GI- soft, NT, ND, + BS Extremities- no clubbing, cyanosis, or edema MS- no significant deformity or atrophy Skin- no rash or lesion Psych- euthymic mood, full affect Neuro- strength and sensation are intact   Wt Readings from Last 3 Encounters:  12/25/20 84.6 kg  12/17/20 86.2 kg  10/10/20 84.2 kg    EKG today demonstrates  SR Vent. rate 84 BPM PR interval 148 ms QRS duration 90 ms QT/QTcB 374/441 ms  Echo 06/25/16 demonstrated  - Procedure narrative: Transthoracic echocardiography. The study    was technically difficult.  - Left ventricle: The cavity size was normal. There was mild    concentric hypertrophy. Systolic function was mildly reduced. The    estimated ejection fraction was in the range of 45% to 50%.    Doppler parameters are consistent with abnormal left ventricular    relaxation (grade 1 diastolic dysfunction).  - Aortic valve: A mechanical prosthesis was present and functioning    normally. Valve area (Vmax): 1.87 cm^2.  - Mitral valve: There was mild regurgitation.  - Left atrium: The atrium was moderately dilated.  - Right atrium: The atrium was mildly dilated.  - Pulmonary arteries: Systolic pressure was within the normal    range.   Epic records are reviewed at length today  CHA2DS2-VASc  Score = 5  The patient's score is based upon: CHF History: 0 HTN History:  1 Diabetes History: 1 Stroke History: 2 Vascular Disease History: 0 Age Score: 1 Gender Score: 0     ASSESSMENT AND PLAN: 1. Paroxysmal Atrial Fibrillation/typical atrial flutter The patient's CHA2DS2-VASc score is 5, indicating a 7.2% annual risk of stroke.   Patient appears to be maintaining SR.  Continue diltiazem 120 mg daily Continue warfarin  2. Secondary Hypercoagulable State (ICD10:  D68.69) The patient is at significant risk for stroke/thromboembolism based upon his CHA2DS2-VASc Score of 5.  Continue Warfarin (Coumadin).   3. S/p aortic MVR On warfarin  4. HTN Stable. Patient having some orthostatic symptoms but his wife reports this has been chronic for years since his surgeries and has been no worse since starting diltiazem. Will continue present therapy for now, encouraged them to reach out if symptoms worsen.    Follow up with Dr [name] as scheduled. AF clinic in 6 months.    [facility] [address] [place], [place] [address] [phone] 12/25/2020 10:40 AM

## 2020-12-26 ENCOUNTER — Ambulatory Visit (INDEPENDENT_AMBULATORY_CARE_PROVIDER_SITE_OTHER): Payer: Medicare PPO | Admitting: Cardiovascular Disease

## 2020-12-26 DIAGNOSIS — Z7901 Long term (current) use of anticoagulants: Secondary | ICD-10-CM

## 2020-12-26 DIAGNOSIS — I359 Nonrheumatic aortic valve disorder, unspecified: Secondary | ICD-10-CM

## 2020-12-26 DIAGNOSIS — Z5181 Encounter for therapeutic drug level monitoring: Secondary | ICD-10-CM | POA: Diagnosis not present

## 2020-12-26 LAB — POCT INR: INR: 2.5 (ref 2.0–3.0)

## 2021-01-04 ENCOUNTER — Other Ambulatory Visit: Payer: Self-pay | Admitting: Cardiology

## 2021-01-09 ENCOUNTER — Other Ambulatory Visit: Payer: Self-pay

## 2021-01-09 ENCOUNTER — Ambulatory Visit: Payer: Medicare PPO | Admitting: Family Medicine

## 2021-01-09 ENCOUNTER — Encounter: Payer: Self-pay | Admitting: Family Medicine

## 2021-01-09 VITALS — BP 90/60 | HR 76 | Temp 98.2°F | Ht 72.0 in | Wt 189.8 lb

## 2021-01-09 DIAGNOSIS — F32A Depression, unspecified: Secondary | ICD-10-CM | POA: Diagnosis not present

## 2021-01-09 DIAGNOSIS — R39198 Other difficulties with micturition: Secondary | ICD-10-CM

## 2021-01-09 DIAGNOSIS — R531 Weakness: Secondary | ICD-10-CM

## 2021-01-09 DIAGNOSIS — E119 Type 2 diabetes mellitus without complications: Secondary | ICD-10-CM

## 2021-01-09 DIAGNOSIS — R5382 Chronic fatigue, unspecified: Secondary | ICD-10-CM | POA: Diagnosis not present

## 2021-01-09 DIAGNOSIS — K5900 Constipation, unspecified: Secondary | ICD-10-CM

## 2021-01-09 DIAGNOSIS — Z125 Encounter for screening for malignant neoplasm of prostate: Secondary | ICD-10-CM | POA: Diagnosis not present

## 2021-01-09 DIAGNOSIS — I10 Essential (primary) hypertension: Secondary | ICD-10-CM | POA: Diagnosis not present

## 2021-01-09 DIAGNOSIS — F419 Anxiety disorder, unspecified: Secondary | ICD-10-CM

## 2021-01-09 LAB — CBC
HCT: 41.2 % (ref 39.0–52.0)
Hemoglobin: 13.9 g/dL (ref 13.0–17.0)
MCHC: 33.7 g/dL (ref 30.0–36.0)
MCV: 90.2 fl (ref 78.0–100.0)
Platelets: 187 10*3/uL (ref 150.0–400.0)
RBC: 4.57 Mil/uL (ref 4.22–5.81)
RDW: 14.2 % (ref 11.5–15.5)
WBC: 5.7 10*3/uL (ref 4.0–10.5)

## 2021-01-09 LAB — IBC + FERRITIN
Ferritin: 106.8 ng/mL (ref 22.0–322.0)
Iron: 93 ug/dL (ref 42–165)
Saturation Ratios: 30.1 % (ref 20.0–50.0)
TIBC: 309.4 ug/dL (ref 250.0–450.0)
Transferrin: 221 mg/dL (ref 212.0–360.0)

## 2021-01-09 LAB — VITAMIN B12: Vitamin B-12: 297 pg/mL (ref 211–911)

## 2021-01-09 LAB — COMPREHENSIVE METABOLIC PANEL
ALT: 11 U/L (ref 0–53)
AST: 15 U/L (ref 0–37)
Albumin: 4.3 g/dL (ref 3.5–5.2)
Alkaline Phosphatase: 104 U/L (ref 39–117)
BUN: 21 mg/dL (ref 6–23)
CO2: 29 mEq/L (ref 19–32)
Calcium: 9.7 mg/dL (ref 8.4–10.5)
Chloride: 103 mEq/L (ref 96–112)
Creatinine, Ser: 1.18 mg/dL (ref 0.40–1.50)
GFR: 61.74 mL/min (ref >60.00)
Glucose, Bld: 132 mg/dL — ABNORMAL HIGH (ref 70–99)
Potassium: 4.5 mEq/L (ref 3.5–5.1)
Sodium: 139 mEq/L (ref 135–145)
Total Bilirubin: 0.7 mg/dL (ref 0.2–1.2)
Total Protein: 7.3 g/dL (ref 6.0–8.3)

## 2021-01-09 LAB — TSH: TSH: 4.16 u[IU]/mL (ref 0.35–5.50)

## 2021-01-09 LAB — HEMOGLOBIN A1C: Hgb A1c MFr Bld: 6.8 % — ABNORMAL HIGH (ref 4.6–6.5)

## 2021-01-09 LAB — PSA, MEDICARE: PSA: 0.25 ng/ml (ref 0.10–4.00)

## 2021-01-09 LAB — VITAMIN D 25 HYDROXY (VIT D DEFICIENCY, FRACTURES): VITD: 30.65 ng/mL (ref 30.00–100.00)

## 2021-01-09 NOTE — Assessment & Plan Note (Signed)
This is a chronic ongoing issue.  It could be related to his anxiety and depression or his blood pressure being slightly low.  We will check additional lab work to evaluate further for a cause of this.

## 2021-01-09 NOTE — Assessment & Plan Note (Signed)
Chronic issue.  I have encouraged them to discuss his anxiety and depression with his psychiatrist when he follows up with them next week.

## 2021-01-09 NOTE — Progress Notes (Signed)
[name], MD Phone: [phone]  Alexander Barton is a 70 y.o. male who presents today for follow-up.  Constipation: Patient has been doing MiraLAX and Citrucel with good benefit.  Notes most days he has a bowel movement.  At times he does not feel like he completely evacuates his bowels.  He does have bowel urgency as well.  He did see GI.  No blood in his stool.  Urinary abnormality: Patient reports he has had prostate issues for many years.  He has been on Flomax for his spastic prostate.  Recently he has developed slow stream and a splitting of his stream.  He gets up 3-4 times per night to urinate.  Major depressive disorder: Patient does note some depression and anxiety.  His oldest sister died and he was tasked with being the executor of her will.  They had to go through the process to get him removed from that.  He notes no SI.  He has been taking Zyprexa.  His wife gave him half a pill of Zyprexa yesterday during the day to help with anxiety.  Decreased energy level: This is an ongoing issue.  He notes no chest pain or shortness of breath.  He notes after getting ready in the morning he is not able to do much for the rest of the day.  They do note his blood pressure seems to be generally stable and similar to today.  He does get lightheadedness when he stands up and they note the insurance company had a nurse come out who noted his blood pressure dropped when he stood up.  The patient's wife notes that they mentioned the lightheadedness to the atrial fibrillation doctor last week though no comment was made on this.  He reports he feels weak all over and requests physical therapy.  Social History   Tobacco Use  Smoking Status Never  Smokeless Tobacco Never    Current Outpatient Medications on File Prior to Visit  Medication Sig Dispense Refill   acetaminophen (TYLENOL) 500 MG tablet Take 1,000 mg by mouth every 6 (six) hours as needed for mild pain.     albuterol (VENTOLIN  HFA) 108 (90 Base) MCG/ACT inhaler Inhale 2 puffs into the lungs every 4 (four) hours as needed for wheezing or shortness of breath.     blood glucose meter kit and supplies KIT Dispense based on patient and insurance preference. Use twice daily. (FOR ICD-10 E11.9). 1 each 0   Budeson-Glycopyrrol-Formoterol (BREZTRI AEROSPHERE) 160-9-4.8 MCG/ACT AERO Inhale 2 puffs into the lungs in the morning and at bedtime. 10.7 g 3   Cholecalciferol (VITAMIN D3) 25 MCG (1000 UT) CAPS Take by mouth.     diltiazem (CARDIZEM CD) 120 MG 24 hr capsule Take 1 capsule (120 mg total) by mouth daily. 90 capsule 3   ezetimibe (ZETIA) 10 MG tablet Take 1 tablet (10 mg total) by mouth daily. 90 tablet 1   glucose blood test strip Use twice daily E11.9 100 each 1   Multiple Vitamins-Minerals (HAIR SKIN & NAILS ADVANCED PO) Take by mouth.     OLANZapine (ZYPREXA) 5 MG tablet TAKE 1 TABLET BY MOUTH EVERYDAY AT BEDTIME 90 tablet 1   OVER THE COUNTER MEDICATION Take 1 capsule by mouth See admin instructions. Dotera Digestive Supplement capsule- Take 1 capsule by mouth in the morning     polyethylene glycol (MIRALAX / GLYCOLAX) 17 g packet Take 17 g by mouth in the morning.     pravastatin (PRAVACHOL) 10 MG  tablet Take 1 tablet (10 mg total) by mouth daily. 90 tablet 1   tamsulosin (FLOMAX) 0.4 MG CAPS capsule TAKE 2 CAPSULES BY MOUTH EVERY DAY 174 capsule 1   vitamin B-12 (CYANOCOBALAMIN) 100 MCG tablet Take 100 mcg by mouth daily.     warfarin (COUMADIN) 2 MG tablet TAKE 1 TABLET WITH 5 MG TABLET DAILY AS DIRECTED BY COUMADIN CLINIC 90 tablet 0   warfarin (COUMADIN) 5 MG tablet TAKE 1 TABLET BY MOUTH DAILY AS DIRECTED WITH 2 MG TABLET 90 tablet 1   No current facility-administered medications on file prior to visit.     ROS see history of present illness  Objective  Physical Exam Vitals:   01/09/21 0901  BP: 90/60  Pulse: 76  Temp: 98.2 F (36.8 C)  SpO2: 97%    BP Readings from Last 3 Encounters:  01/09/21  90/60  12/25/20 114/74  12/17/20 99/63   Wt Readings from Last 3 Encounters:  01/09/21 189 lb 12.8 oz (86.1 kg)  12/25/20 186 lb 6.4 oz (84.6 kg)  12/17/20 190 lb (86.2 kg)    Physical Exam Constitutional:      General: He is not in acute distress.    Appearance: He is not diaphoretic.  Cardiovascular:     Rate and Rhythm: Normal rate and regular rhythm.     Heart sounds: Normal heart sounds.  Pulmonary:     Effort: Pulmonary effort is normal.     Breath sounds: Normal breath sounds.  Skin:    General: Skin is warm and dry.  Neurological:     Mental Status: He is alert.     Assessment/Plan: Please see individual problem list.  Problem List Items Addressed This Visit     HTN (hypertension) (Chronic)    The patient's blood pressure has been borderline low and he appears to be getting orthostatic at times.  Discussed that this could be related to the Flomax or the diltiazem though diltiazem would seem less likely than other medications for A. fib to cause this issue.  He has been on the Flomax for quite some time.  I have encouraged him to monitor and to follow-up with cardiology on this.      Relevant Orders   Comp Met (CMET)   Anxiety and depression    Chronic issue.  I have encouraged them to discuss his anxiety and depression with his psychiatrist when he follows up with them next week.      Constipation    Improved.  He will continue MiraLAX and Citrucel.  Discussed that he may have to titrate up and down on the dose periodically.      Fatigue    This is a chronic ongoing issue.  It could be related to his anxiety and depression or his blood pressure being slightly low.  We will check additional lab work to evaluate further for a cause of this.      Relevant Orders   CBC   IBC + Ferritin   TSH   B12   Vitamin D (25 hydroxy)   Type 2 diabetes mellitus without complications ([place]) - Primary   Relevant Orders   HgB A1c   Urine stream spraying    He reports a  slow stream and split stream.  Discussed that my concern would be for a urethral stricture.  We will refer to urology for further evaluation.  Discussed that they may be able to alter his regimen for his prostate if they see fit.  We will check a PSA as well.      Relevant Orders   Ambulatory referral to Urology   Other Visit Diagnoses     Generalized weakness       Relevant Orders   Ambulatory referral to Physical Therapy   Prostate cancer screening       Relevant Orders   PSA, Medicare ( [place] only)        Return in about 3 months (around 04/09/2021).  This visit occurred during the SARS-CoV-2 public health emergency.  Safety protocols were in place, including screening questions prior to the visit, additional usage of staff PPE, and extensive cleaning of exam room while observing appropriate contact time as indicated for disinfecting solutions.    [name], MD [place]

## 2021-01-09 NOTE — Assessment & Plan Note (Signed)
Improved.  He will continue MiraLAX and Citrucel.  Discussed that he may have to titrate up and down on the dose periodically.

## 2021-01-09 NOTE — Assessment & Plan Note (Signed)
The patient's blood pressure has been borderline low and he appears to be getting orthostatic at times.  Discussed that this could be related to the Flomax or the diltiazem though diltiazem would seem less likely than other medications for A. fib to cause this issue.  He has been on the Flomax for quite some time.  I have encouraged him to monitor and to follow-up with cardiology on this.

## 2021-01-09 NOTE — Patient Instructions (Signed)
Nice to see you. We will get lab work today. Urology should call you to schedule an appointment. Please discuss your depression and anxiety with your psychiatrist. Please discussed the lightheadedness with your cardiologist.

## 2021-01-09 NOTE — Assessment & Plan Note (Signed)
He reports a slow stream and split stream.  Discussed that my concern would be for a urethral stricture.  We will refer to urology for further evaluation.  Discussed that they may be able to alter his regimen for his prostate if they see fit.  We will check a PSA as well.

## 2021-01-11 ENCOUNTER — Ambulatory Visit (INDEPENDENT_AMBULATORY_CARE_PROVIDER_SITE_OTHER): Payer: Medicare PPO | Admitting: Pharmacist Clinician (PhC)/ Clinical Pharmacy Specialist

## 2021-01-11 DIAGNOSIS — Z952 Presence of prosthetic heart valve: Secondary | ICD-10-CM

## 2021-01-11 DIAGNOSIS — I359 Nonrheumatic aortic valve disorder, unspecified: Secondary | ICD-10-CM

## 2021-01-11 DIAGNOSIS — Z8673 Personal history of transient ischemic attack (TIA), and cerebral infarction without residual deficits: Secondary | ICD-10-CM

## 2021-01-11 DIAGNOSIS — Z7901 Long term (current) use of anticoagulants: Secondary | ICD-10-CM

## 2021-01-11 LAB — POCT INR: INR: 3.3 — AB (ref 2.0–3.0)

## 2021-01-16 ENCOUNTER — Ambulatory Visit: Payer: Medicare PPO | Attending: Family Medicine | Admitting: Physical Therapy

## 2021-01-16 ENCOUNTER — Encounter: Payer: Self-pay | Admitting: Physical Therapy

## 2021-01-16 ENCOUNTER — Other Ambulatory Visit: Payer: Self-pay

## 2021-01-16 VITALS — BP 129/64 | HR 77

## 2021-01-16 DIAGNOSIS — M6281 Muscle weakness (generalized): Secondary | ICD-10-CM | POA: Diagnosis not present

## 2021-01-16 DIAGNOSIS — R262 Difficulty in walking, not elsewhere classified: Secondary | ICD-10-CM

## 2021-01-16 DIAGNOSIS — M25561 Pain in right knee: Secondary | ICD-10-CM | POA: Insufficient documentation

## 2021-01-16 DIAGNOSIS — R531 Weakness: Secondary | ICD-10-CM | POA: Diagnosis not present

## 2021-01-16 NOTE — Therapy (Signed)
[facility] [address], [place], [address] Phone: [phone]   Fax:  [phone]  Physical Therapy Evaluation  Patient Details  Name: Alexander Barton MRN: [id number] Date of Birth: September 24, 1950 Referring Provider (PT): Dr. [name]   Encounter Date: 01/16/2021   PT End of Session - 01/16/21 1609     Visit Number 1    Number of Visits 16    Date for PT Re-Evaluation 03/13/21    PT Start Time 1015    PT Stop Time 1100    PT Time Calculation (min) 45 min    Equipment Utilized During Treatment Gait belt    Activity Tolerance Patient tolerated treatment well    Behavior During Therapy WFL for tasks assessed/performed             Past Medical History:  Diagnosis Date   Aortic aneurysm, thoracic    Aortic valve disease    Arthritis    BPH (benign prostatic hyperplasia)    Complication of anesthesia    goes into depression   COPD (chronic obstructive pulmonary disease) ([place])    GERD (gastroesophageal reflux disease)    HTN (hypertension)    Hypercholesterolemia    Stroke ([place])    TIA (transient ischemic attack)     Past Surgical History:  Procedure Laterality Date   ACHILLES TENDON REPAIR     AORTIC VALVE REPLACEMENT     #23 On-X valve conduit   ASCENDING AORTIC ANEURYSM REPAIR     BACK SURGERY     CHOLECYSTECTOMY N/A 04/12/2020   Procedure: LAPAROSCOPIC CHOLECYSTECTOMY;  Surgeon: Coralie Keens, MD;  Location: [facility];  Service: General;  Laterality: N/A;   FOOT SURGERY      Vitals:   01/16/21 1024  BP: 129/64  Pulse: 77  SpO2: 97%      Subjective Assessment - 01/16/21 1018     Subjective Pt reports he cannot stand for long and feels like his legs are going to collapse under him. He can walk only short distances and cannot walk long distances.    Pertinent History Per [place]'s note on 01/09/21      This is a chronic ongoing issue.  It could be related to his anxiety and  depression or his blood pressure being slightly low.  We will check additional lab work to evaluate further for a cause of this.  Decreased energy level: This is an ongoing issue.  He notes no chest pain or shortness of breath.  He notes after getting ready in the morning he is not able to do much for the rest of the day.  They do note his blood pressure seems to be generally stable and similar to today.  He does get lightheadedness when he stands up and they note the insurance company had a nurse come out who noted his blood pressure dropped when he stood up.  The patient's wife notes that they mentioned the lightheadedness to the atrial fibrillation doctor last week though no comment was made on this.  He reports he feels weak all over and requests physical therapy.    Limitations Standing;Walking;House hold activities    How long can you sit comfortably? N/a    How long can you stand comfortably? Can only stand for short periods of time    How long can you walk comfortably? Can only walk short distances    Patient Stated Goals Obtain home exercise program that is total body both upper  body and lower body    Currently in Pain? No/denies                [facility] PT Assessment - 01/16/21 0001       Assessment   Medical Diagnosis Generalized weakness    Referring Provider (PT) Dr. [name]    Onset Date/Surgical Date 10/04/20    Next MD Visit 04/03/21    Prior Therapy Yes      Balance Screen   Has the patient fallen in the past 6 months No    Has the patient had a decrease in activity level because of a fear of falling?  No    Is the patient reluctant to leave their home because of a fear of falling?  No      Home Environment   Living Environment Private residence    Living Arrangements Spouse/significant other    Type of [place] to enter    Entrance Stairs-Number of Steps 4    Entrance Stairs-Rails Right    [place] One level      Prior Function   Level  of [employer] Retired      Associate Professor   Overall Cognitive Status Within Functional Limits for tasks assessed    Memory Impaired    Memory Impairment Decreased long term memory;Decreased short term memory    Awareness Appears intact            Geriatric Evaluation   ROM: UE and LE WFL   Strength                                       R     L                Shoulder  Flex  5/5   5/5      Shoulder Abd  5/5   5/5      Elbow Flex     5/5    5/5             Elbow Ext      5/5    5/5      Grip Strength Good  Good          Hip flex:      4+/5   4+/5               abd:        5/5     5/5         Knee flex:    5/5     5/5               ext:        5/5     5/5          Ankle DF:  5/5     5/5     Great toe ext:   5/5    5/5        5 Times Sit to Stand: 16 sec  (Individuals with times that exceed the listed time have worse than average performance - predictive of recurrent falls in healthy community-living subjects)         - 70-79 y.o. 12.6 sec         30 sec chair stands: 9 reps  <12 reps for Males 70-74  *A below average score  indicates a risk for falls  STEADI 4-Stage Balance Test: (inability to maintain tandem stance for 10 sec = increased risk for falls) - feet together, eyes open, noncompliant surface: 10/10 sec - semi-tandem stance, eyes open, noncompliant surface: 10/10 sec - tandem stances, eyes open, noncompliant surface: 10/10 sec - single leg stance:  R: 3 sec, 5 sec, 4 sec  L: 10 sec   10 Meter Walk Test   Gait Speed:    1st trial 9.22 sec , 2nd trial 8.71 Avg= 8.97 sec - Normal Gait speed Avg=  1.11 m/sec   Fast Gait Speed: 1st trial 7.18 sec, 2nd 6.94  sec, Avg=  7.06 sec -Fast Gait speed time= 1.42 m/sec   -> 1 m/sec [employer] and cross street safely and normal WS  - 0.8 - 1.3 m/sec - community ambulator - associated with increased independence in self-care     THEREX:   Sit to Stand from 20 inch chair  height 1 x 10  -Pt reports increased pain in right knee   Mini-Squats 1 x 10 -mod vc to maintain knees behind toes   -Pt continues to experience pain in right knee  Supine Bridges 1 x 10            PT Short Term Goals - 01/16/21 1623       PT SHORT TERM GOAL #1   Title Patient will demonstrate understanding of HEP to improve outcomes.    Baseline 01/16/21: NT    Time 2    Period Weeks    Status New    Target Date 01/30/21               PT Long Term Goals - 01/16/21 1624       PT LONG TERM GOAL #1   Title Patient will have improved function and activity level as evidenced by an increase in FOTO score by 10 points or more.    Baseline 01/16/21: 52/61    Time 8    Period Weeks    Status New    Target Date 03/13/21      PT LONG TERM GOAL #2   Title Patient will perform 5 x STS in <12.6 sec to demonstrate improve LE strength and that he is no longer at a risk for falls.    Baseline 01/16/21: 16 sec    Time 8    Period Weeks    Status New    Target Date 03/13/21      PT LONG TERM GOAL #3   Title Patient will perform >=12 chair stand repititions in 30 sec to demonstrate increase LE endurance and that is no longer at a risk for falls.    Baseline 01/16/21: 9 reps    Time 8    Period Weeks    Status New    Target Date 03/13/21      PT LONG TERM GOAL #4   Title Pt will increase 6MWT by at least 23m (134ft) in order to demonstrate clinically significant improvement in cardiopulmonary endurance and community ambulation    Baseline 01/16/21: NT    Time 8    Period Weeks    Status New    Target Date 03/13/21                    Plan - 01/16/21 1610     Clinical Impression Statement Pt is a 71 yo male that presents to PT for initial eval for generalized weakness and unsteady  gait. Pt has no observed mobility or strength deficits. He demonstrates LE weakness that places him at an increased risk for falls. Pt has a gait speed that does not place him at  an increased risk for falls    Personal Factors and Comorbidities Age;Comorbidity 3+    Comorbidities AAA, BPH, T2DM, HTN, H/o Stroke , Chronic Right Knee Pain    Examination-Activity Limitations Stand;Locomotion Level;Stairs    Examination-Participation Restrictions Community Activity;Laundry;Meal Prep    Stability/Clinical Decision Making Stable/Uncomplicated    Clinical Decision Making Low    Rehab Potential Good    PT Frequency 2x / week    PT Duration 8 weeks    PT Treatment/Interventions Neuromuscular re-education;Balance training;Therapeutic exercise;Therapeutic activities;Moist Heat;Gait training;Stair training;Cryotherapy;Energy conservation;Patient/family education;Dry needling;Electrical Stimulation;Passive range of motion;Manual techniques;DME Instruction;Aquatic Therapy;Orthotic Fit/Training;Vestibular;Spinal Manipulations;Joint Manipulations;Functional mobility training    PT Next Visit Plan 41mWT, DGI (?), R knee Exam, Progress LE strengthening    PT Home Exercise Plan [email or web address]            HEP includes the following:   Access Code: [email or web address] URL: [email or web address] Date: 01/16/2021 Prepared by: Bradly Chris  Exercises Supine Bridge - 1 x daily - 3 x weekly - 3 sets - 10 reps   Patient will benefit from skilled therapeutic intervention in order to improve the following deficits and impairments:  Pain, Abnormal gait, Difficulty walking, Decreased strength, Decreased balance, Decreased cognition  Visit Diagnosis: Muscle weakness (generalized)  Difficulty in walking, not elsewhere classified     Problem List Patient Active Problem List   Diagnosis Date Noted   Urine stream spraying 01/09/2021   Fatigue 10/10/2020   Constipation 10/10/2020   Paroxysmal atrial fibrillation ([place]) 09/24/2020   Typical atrial flutter ([place]) 09/24/2020   Secondary hypercoagulable state ([place]) 09/24/2020   S/P laparoscopic cholecystectomy 04/12/2020   Globus  sensation 04/09/2020   Decreased oral intake 12/02/2019   Moderate recurrent major depression ([place]) 12/02/2019   Adjustment disorder with anxiety 12/02/2019   Anxiety and depression 11/17/2019   Gallstone pancreatitis 09/21/2019   Sleep disturbance 09/21/2019   Asthma-COPD overlap syndrome ([place]) 08/26/2019   Environmental exposure 08/26/2019   Idiopathic hypoparathyroidism ([place]) 06/29/2019   PVC's (premature ventricular contractions) 06/09/2019   Pancreatic pseudocyst    Intractable abdominal pain 05/30/2019   Nausea and vomiting 05/30/2019   Hyperglycemia 05/30/2019   Pancreatic cyst    Bilateral finger numbness 01/20/2019   Headache 01/20/2019   Night sweats 01/20/2019   Educated about COVID-19 virus infection 01/05/2019   Cardiomyopathy ([place]) 01/05/2019   COVID-19 11/10/2018   Fall 05/07/2018   Costochondritis 01/04/2018   Thyroid nodule 01/04/2018   Dissecting aneurysm of thoracic aorta, [place] type B 11/08/2017   Anemia 11/08/2017   Hyponatremia 11/08/2017   Overweight 07/01/2017   History of stroke    GERD (gastroesophageal reflux disease)    Arthritis    Neck pain 12/01/2016   Knee osteoarthritis 12/01/2016   Physical deconditioning 06/24/2016   Dyspnea on exertion 06/23/2016   Chronic pain of right knee 12/18/2015   Chronic obstructive pulmonary disease ([place]) 11/14/2014   BPH (benign prostatic hyperplasia) 11/14/2014   Long term current use of anticoagulant therapy 10/29/2013   Type 2 diabetes mellitus without complications ([place]) 60/73/7106   TIA (transient ischemic attack) 06/03/2012   H/O aortic valve replacement 12/26/2011   History of aortic arch replacement 01/07/2011   HTN (hypertension)    Hyperlipidemia    Aortic valve disease    Bradly Chris PT, DPT  01/16/2021, 4:41 PM  [facility] [address] [place], [address] Phone: [phone]   Fax:  [phone]  Name: Alexander Barton MRN: [id number] Date of Birth: [birth year]

## 2021-01-18 ENCOUNTER — Ambulatory Visit (INDEPENDENT_AMBULATORY_CARE_PROVIDER_SITE_OTHER): Payer: Medicare PPO | Admitting: Pharmacist

## 2021-01-18 ENCOUNTER — Telehealth: Payer: Self-pay

## 2021-01-18 DIAGNOSIS — I359 Nonrheumatic aortic valve disorder, unspecified: Secondary | ICD-10-CM | POA: Diagnosis not present

## 2021-01-18 DIAGNOSIS — Z7901 Long term (current) use of anticoagulants: Secondary | ICD-10-CM

## 2021-01-18 LAB — POCT INR: INR: 2.7 (ref 2.0–3.0)

## 2021-01-18 NOTE — Telephone Encounter (Signed)
I spoke to the patient and reminded him to check INR.

## 2021-01-18 NOTE — Patient Instructions (Signed)
Description   Take only one 5mg  tablet today and then continue taking 10 mg daily except 7 mg each Sunday, Tuesday and Thursday.  Repeat INR in 2 weeks.

## 2021-01-21 ENCOUNTER — Ambulatory Visit: Payer: Medicare PPO | Admitting: Physical Therapy

## 2021-01-21 DIAGNOSIS — R531 Weakness: Secondary | ICD-10-CM | POA: Diagnosis not present

## 2021-01-21 DIAGNOSIS — M25561 Pain in right knee: Secondary | ICD-10-CM

## 2021-01-21 DIAGNOSIS — R262 Difficulty in walking, not elsewhere classified: Secondary | ICD-10-CM

## 2021-01-21 DIAGNOSIS — M6281 Muscle weakness (generalized): Secondary | ICD-10-CM

## 2021-01-21 NOTE — Therapy (Signed)
[facility] [address] [place], [address] Phone: [phone]   Fax:  [phone]  Physical Therapy Treatment  Patient Details  Name: Alexander Barton MRN: [id number] Date of Birth: 1950/09/08 Referring Provider (PT): Dr. [name]   Encounter Date: 01/21/2021   PT End of Session - 01/21/21 1150     Visit Number 2    Number of Visits 16    Date for PT Re-Evaluation 03/13/21    Authorization Type [employer]    Authorization Time Period 01/14/21-04/12/21    Authorization - Visit Number 2    Authorization - Number of Visits 24    Progress Note Due on Visit 10    Equipment Utilized During Treatment Gait belt    Activity Tolerance Patient tolerated treatment well    Behavior During Therapy [facility] for tasks assessed/performed             Past Medical History:  Diagnosis Date   Aortic aneurysm, thoracic    Aortic valve disease    Arthritis    BPH (benign prostatic hyperplasia)    Complication of anesthesia    goes into depression   COPD (chronic obstructive pulmonary disease) (HCC)    GERD (gastroesophageal reflux disease)    HTN (hypertension)    Hypercholesterolemia    Stroke ([place])    TIA (transient ischemic attack)     Past Surgical History:  Procedure Laterality Date   ACHILLES TENDON REPAIR     AORTIC VALVE REPLACEMENT     #23 On-X valve conduit   ASCENDING AORTIC ANEURYSM REPAIR     BACK SURGERY     CHOLECYSTECTOMY N/A 04/12/2020   Procedure: LAPAROSCOPIC CHOLECYSTECTOMY;  Surgeon: Coralie Keens, MD;  Location: [place];  Service: General;  Laterality: N/A;   FOOT SURGERY      There were no vitals filed for this visit.   Subjective Assessment - 01/21/21 1148     Subjective Pt reports increased fatigue at the start of visit.    Pertinent History Per Sonnenberg's note on 01/09/21      This is a chronic ongoing issue.  It could be related to his anxiety and depression or his blood  pressure being slightly low.  We will check additional lab work to evaluate further for a cause of this.  Decreased energy level: This is an ongoing issue.  He notes no chest pain or shortness of breath.  He notes after getting ready in the morning he is not able to do much for the rest of the day.  They do note his blood pressure seems to be generally stable and similar to today.  He does get lightheadedness when he stands up and they note the insurance company had a nurse come out who noted his blood pressure dropped when he stood up.  The patient's wife notes that they mentioned the lightheadedness to the atrial fibrillation doctor last week though no comment was made on this.  He reports he feels weak all over and requests physical therapy.    Limitations Standing;Walking;House hold activities    How long can you sit comfortably? N/a    How long can you stand comfortably? Can only stand for short periods of time    How long can you walk comfortably? Can only walk short distances    Patient Stated Goals Obtain home exercise program that is total body both upper body and lower body    Currently in Pain? No/denies  THEREX:     6 MINUTE WALK TEST: - distance ambulated: 1625 feet or 495 meters (94% of age and gender matched norm) - assistive device used: none - supplemental O2 requirements/used: none -bp: 117/54 hr 87 SpO2 82   Community-dwelling Elderly (>60 yrs of age) who are nonsmokers and have no history of dizziness Norms Mean Distance in Meters by Age & Gender Dorene Grebe et al 2002)        Age         Male         Male              70-79 yrs      28 m         58 m   - Fracture - [place] Knee Rules Following knee trauma, 1+ of the following: Age 45+ Isolated patellar tenderness (no other bony tenderness) Fibular head tenderness Unable to flex knee > 90 degrees Unable to bear weight both immediately and in ED (4 steps, limping ok)  OBJECTIVE:  *INDICATES  PAIN   Vitals: HR: BP: SpO2:  Observation: Gross symmetry: Posture: Gait: Stairs: Palpation:    Strength:           R         L  Knee Flexion:      5/5       5/5 Knee Extension:    5/5       5/5  Hip Extension:     4/5       4/5 Hip Abduction:     4/5       4/5 Hip Adduction:     4/5       4/5  Ankle DF:          5/5       5/5 Ankle PF:          5/5       5/5    Hip  ROM                      R                 L                   AROM     PROM     AROM    PROM         NORMS  Flexion       120      120      120     120           120 Extension     20       20        20      20            20                          Knee ROM            R                 L              NORMS               AROM     PROM      AROM   PROM Flexion:      150  150       150    150           150  Extension:    0         0         0      0            0-10   Special Tests:  Meniscal  Cluster (3/5): Hx of joint locking                 (-) Pain/click with McMurray's test     (-) Joint line tenderness               (-) Pain with flexion overpressure      (+) Pain with extension overpressure    (-) ACL Lachman's                           (-)  MCL: Valgus Stress Test                  (-) LCL:  Varus Stress Test                   (-) PFPS:  Patellar Tilt Test                  (-) Patellar Apprehension Test          (+/-)  Flexibility tests: Thomas Test                         (+) Ober's Test                         (-) Hamstring 90/90                     (+) Ely's Test                          (-)  Knee OA - + passed on imaging  Criteria for classification: Age > 50                                    + Knee crepitus                            - Palpable bony enlargement   NT  Bony tenderness to palpation - Morning stiffness <30 min       NT No palpable warmth of synovium + < 3 helps rule OUT > 3 less impressive for ruling IN   Modified Thomas Stretch with Quad Component  2 x 60 sec  Prone Quad Stretch 2 x 60 sec  Forward Flexion Stretch 2 x 60 sec  Seated Hip ER Stretch 2 x 60 sec               PT Education - 01/21/21 1149     Education Details form and technique for appropriate exercise    Person(s) Educated Patient    Methods Explanation;Demonstration;Verbal cues;Handout    Comprehension Verbalized understanding;Returned demonstration;Verbal cues required              PT Short Term Goals - 01/21/21 1309       PT SHORT  TERM GOAL #1   Title Patient will demonstrate understanding of HEP to improve outcomes.    Baseline 01/16/21: NT    Time 2    Period Weeks    Status On-going    Target Date 01/30/21               PT Long Term Goals - 01/21/21 1310       PT LONG TERM GOAL #1   Title Patient will have improved function and activity level as evidenced by an increase in FOTO score by 10 points or more.    Baseline 01/16/21: 52/61    Time 8    Period Weeks    Status On-going    Target Date 03/13/21      PT LONG TERM GOAL #2   Title Patient will perform 5 x STS in <12.6 sec to demonstrate improve LE strength and that he is no longer at a risk for falls.    Baseline 01/16/21: 16 sec    Time 8    Period Weeks    Status On-going    Target Date 03/13/21      PT LONG TERM GOAL #3   Title Patient will perform >=12 chair stand repititions in 30 sec to demonstrate increase LE endurance and that is no longer at a risk for falls.    Baseline 01/16/21: 9 reps    Time 8    Period Weeks    Status On-going    Target Date 03/13/21      PT LONG TERM GOAL #4   Title Pt will increase 6MWT by at least 44m (130ft) in order to demonstrate clinically significant improvement in cardiopulmonary endurance and community ambulation    Baseline 01/16/21: NT 01/21/21: 495 m/1,625 ft    Time 8    Period Weeks    Status On-going    Target Date 03/13/21                   Plan - 01/21/21 1219     Clinical Impression Statement Pt  presents for f/u for generalized weakness and unsteady gait. He displays aerobic endurnace that is slightly below age and gender matched norms. Pt exhibits PFPS with pain on anterior portion of knee that is exacerbated while negotiating stairs or performing tasks that require increased knee flexion. He also shows decreased flexibility along hip mucsculature. He was able to perform all exercises with mod VC given cognitive difficulties. He will continue to benefit from skilled PT to increase his LE strength and aerobic endurance, and to decrease his knee pain to return to return to walking and standing to perform ADLs without increased difficulty.    Personal Factors and Comorbidities Age;Comorbidity 3+    Comorbidities AAA, BPH, T2DM, HTN, H/o Stroke , Chronic Right Knee Pain    Examination-Activity Limitations Stand;Locomotion Level;Stairs    Examination-Participation Restrictions Community Activity;Laundry;Meal Prep    Stability/Clinical Decision Making Stable/Uncomplicated    Rehab Potential Good    PT Frequency 2x / week    PT Duration 8 weeks    PT Treatment/Interventions Neuromuscular re-education;Balance training;Therapeutic exercise;Therapeutic activities;Moist Heat;Gait training;Stair training;Cryotherapy;Energy conservation;Patient/family education;Dry needling;Electrical Stimulation;Passive range of motion;Manual techniques;DME Instruction;Aquatic Therapy;Orthotic Fit/Training;Vestibular;Spinal Manipulations;Joint Manipulations;Functional mobility training    PT Next Visit Plan Progress LE strengthening and knee strengthening exercises    PT Home Exercise Plan [id number]    Consulted and Agree with Plan of Care Patient             Patient will benefit  from skilled therapeutic intervention in order to improve the following deficits and impairments:  Pain, Abnormal gait, Difficulty walking, Decreased strength, Decreased balance, Decreased cognition  Visit Diagnosis: Muscle weakness  (generalized)  Difficulty in walking, not elsewhere classified  Right knee pain, unspecified chronicity     Problem List Patient Active Problem List   Diagnosis Date Noted   Urine stream spraying 01/09/2021   Fatigue 10/10/2020   Constipation 10/10/2020   Paroxysmal atrial fibrillation (HCC) 09/24/2020   Typical atrial flutter ([place]) 09/24/2020   Secondary hypercoagulable state ([place]) 09/24/2020   S/P laparoscopic cholecystectomy 04/12/2020   Globus sensation 04/09/2020   Decreased oral intake 12/02/2019   Moderate recurrent major depression ([place]) 12/02/2019   Adjustment disorder with anxiety 12/02/2019   Anxiety and depression 11/17/2019   Gallstone pancreatitis 09/21/2019   Sleep disturbance 09/21/2019   Asthma-COPD overlap syndrome ([place]) 08/26/2019   Environmental exposure 08/26/2019   Idiopathic hypoparathyroidism ([place]) 06/29/2019   PVC's (premature ventricular contractions) 06/09/2019   Pancreatic pseudocyst    Intractable abdominal pain 05/30/2019   Nausea and vomiting 05/30/2019   Hyperglycemia 05/30/2019   Pancreatic cyst    Bilateral finger numbness 01/20/2019   Headache 01/20/2019   Night sweats 01/20/2019   Educated about COVID-19 virus infection 01/05/2019   Cardiomyopathy ([place]) 01/05/2019   COVID-19 11/10/2018   Fall 05/07/2018   Costochondritis 01/04/2018   Thyroid nodule 01/04/2018   Dissecting aneurysm of thoracic aorta, [place] type B 11/08/2017   Anemia 11/08/2017   Hyponatremia 11/08/2017   Overweight 07/01/2017   History of stroke    GERD (gastroesophageal reflux disease)    Arthritis    Neck pain 12/01/2016   Knee osteoarthritis 12/01/2016   Physical deconditioning 06/24/2016   Dyspnea on exertion 06/23/2016   Chronic pain of right knee 12/18/2015   Chronic obstructive pulmonary disease ([place]) 11/14/2014   BPH (benign prostatic hyperplasia) 11/14/2014   Long term current use of anticoagulant therapy 10/29/2013   Type 2 diabetes mellitus  without complications ([place]) 49/70/2637   TIA (transient ischemic attack) 06/03/2012   H/O aortic valve replacement 12/26/2011   History of aortic arch replacement 01/07/2011   HTN (hypertension)    Hyperlipidemia    Aortic valve disease    Bradly Chris PT, DPT  01/21/2021, 1:21 PM  [facility] [address], [place], [address] Phone: [phone]   Fax:  [phone]  Name: Alexander Barton MRN: [id number] Date of Birth: [birth year]

## 2021-01-22 ENCOUNTER — Ambulatory Visit (INDEPENDENT_AMBULATORY_CARE_PROVIDER_SITE_OTHER): Payer: Medicare PPO

## 2021-01-22 VITALS — Ht 72.0 in | Wt 189.0 lb

## 2021-01-22 DIAGNOSIS — Z0000 Encounter for general adult medical examination without abnormal findings: Secondary | ICD-10-CM | POA: Diagnosis not present

## 2021-01-22 NOTE — Progress Notes (Signed)
Subjective:   Alexander Barton is a 70 y.o. male who presents for Medicare Annual/Subsequent preventive examination.  Review of Systems    No ROS.  Medicare Wellness Virtual Visit.  Visual/audio telehealth visit, UTA vital signs.   See social history for additional risk factors.   Cardiac Risk Factors include: male gender;advanced age (>19mn, >>38women);diabetes mellitus     Objective:    Today's Vitals   01/22/21 0830  Weight: 189 lb (85.7 kg)  Height: 6' (1.829 m)   Body mass index is 25.63 kg/m.  Advanced Directives 01/22/2021 01/16/2021 09/13/2020 04/12/2020 01/20/2020 12/02/2019 12/01/2019  Does Patient Have a Medical Advance Directive? Yes Yes No;_0   Type of AParamedicof ARose[place]Living will - Living will HMolineLiving will HBenedictLiving will Living will Living will  Does patient want to make changes to medical advance directive? - No - Patient declined - - No - Patient declined No - Patient declined -  Copy of [place]? Yes - validated most recent copy scanned in chart (See row information) - - Yes - validated most recent copy scanned in chart (See row information) Yes - validated most recent copy scanned in chart (See row information) - -  Would patient like information on creating a medical advance directive? - - - - - - -    Current Medications (verified) Outpatient Encounter Medications as of 01/22/2021  Medication Sig   acetaminophen (TYLENOL) 500 MG tablet Take 1,000 mg by mouth every 6 (six) hours as needed for mild pain.   albuterol (VENTOLIN HFA) 108 (90 Base) MCG/ACT inhaler Inhale 2 puffs into the lungs every 4 (four) hours as needed for wheezing or shortness of breath.   blood glucose meter kit and supplies KIT Dispense based on patient and insurance preference. Use twice daily. (FOR ICD-10 E11.9).   Budeson-Glycopyrrol-Formoterol (BREZTRI  AEROSPHERE) 160-9-4.8 MCG/ACT AERO Inhale 2 puffs into the lungs in the morning and at bedtime.   Cholecalciferol (VITAMIN D3) 25 MCG (1000 UT) CAPS Take by mouth.   diltiazem (CARDIZEM CD) 120 MG 24 hr capsule Take 1 capsule (120 mg total) by mouth daily.   ezetimibe (ZETIA) 10 MG tablet Take 1 tablet (10 mg total) by mouth daily.   glucose blood test strip Use twice daily E11.9   Multiple Vitamins-Minerals (HAIR SKIN & NAILS ADVANCED PO) Take by mouth.   OLANZapine (ZYPREXA) 5 MG tablet TAKE 1 TABLET BY MOUTH EVERYDAY AT BEDTIME   OVER THE COUNTER MEDICATION Take 1 capsule by mouth See admin instructions. Dotera Digestive Supplement capsule- Take 1 capsule by mouth in the morning   polyethylene glycol (MIRALAX / GLYCOLAX) 17 g packet Take 17 g by mouth in the morning.   tamsulosin (FLOMAX) 0.4 MG CAPS capsule TAKE 2 CAPSULES BY MOUTH EVERY DAY   vitamin B-12 (CYANOCOBALAMIN) 100 MCG tablet Take 100 mcg by mouth daily.   warfarin (COUMADIN) 2 MG tablet TAKE 1 TABLET WITH 5 MG TABLET DAILY AS DIRECTED BY COUMADIN CLINIC   warfarin (COUMADIN) 5 MG tablet TAKE 1 TABLET BY MOUTH DAILY AS DIRECTED WITH 2 MG TABLET   [DISCONTINUED] pravastatin (PRAVACHOL) 10 MG tablet Take 1 tablet (10 mg total) by mouth daily.   No facility-administered encounter medications on file as of 01/22/2021.    Allergies (verified) Codeine; Gadolinium derivatives; Hydrocodone; Iodinated diagnostic agents; Other; Statins; Antihistamines, diphenhydramine-type; Hydromorphone; Lipitor [atorvastatin calcium]; and Seroquel [quetiapine fumerate]  History: Past Medical History:  Diagnosis Date   Aortic aneurysm, thoracic    Aortic valve disease    Arthritis    BPH (benign prostatic hyperplasia)    Complication of anesthesia    goes into depression   COPD (chronic obstructive pulmonary disease) (HCC)    GERD (gastroesophageal reflux disease)    HTN (hypertension)    Hypercholesterolemia    Stroke (HCC)    TIA  (transient ischemic attack)    Past Surgical History:  Procedure Laterality Date   ACHILLES TENDON REPAIR     AORTIC VALVE REPLACEMENT     #23 On-X valve conduit   ASCENDING AORTIC ANEURYSM REPAIR     BACK SURGERY     CHOLECYSTECTOMY N/A 04/12/2020   Procedure: LAPAROSCOPIC CHOLECYSTECTOMY;  Surgeon: Coralie Keens, MD;  Location: [facility] OR;  Service: General;  Laterality: N/A;   FOOT SURGERY     Family History  Problem Relation Age of Onset   Alzheimer's disease Father    Diabetes Father        age onset DM   Hypertension Sister    Hypertension Brother    Diabetes Mother    Heart attack Sister    Diabetes Sister    Colon cancer Neg Hx    Social History   Socioeconomic History   Marital status: Married    Spouse name: Not on file   Number of children: 2   Years of education: Not on file   Highest education level: Not on file  Occupational History   Occupation: Scientist, clinical (histocompatibility and immunogenetics): [employer]  Tobacco Use   Smoking status: Never   Smokeless tobacco: Never  Substance and Sexual Activity   Alcohol use: No    Alcohol/week: 0.0 standard drinks   Drug use: No   Sexual activity: Not Currently  Other Topics Concern   Not on file  Social History Narrative   Married, 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   One son and one daughter   He lives in [place]   One caffeinated beverage daily   08/30/2014   Social Determinants of Health   Financial Resource Strain: Low Risk    Difficulty of Paying Living Expenses: Not hard at all  Food Insecurity: No Food Insecurity   Worried About Charity fundraiser in the Last Year: Never true   Arboriculturist in the Last Year: Never true  Transportation Needs: No Transportation Needs   Lack of Transportation (Medical): No   Lack of Transportation (Non-Medical): No  Physical Activity: Insufficiently Active   Days of Exercise per Week: 2 days   Minutes of Exercise per Session: 50 min   Stress: No Stress Concern Present   Feeling of Stress : Not at all  Social Connections: Unknown   Frequency of Communication with Friends and Family: Not on file   Frequency of Social Gatherings with Friends and Family: Not on file   Attends Religious Services: Not on Electrical engineer or Organizations: Not on file   Attends Archivist Meetings: Not on file   Marital Status: Married    Tobacco Counseling Counseling given: Not Answered   Clinical Intake:  Pre-visit preparation completed: Yes        Diabetes: Yes (Followed by PCP)  How often do you need to have someone help you when you read instructions, pamphlets, or other written materials from your doctor  or pharmacy?: 5 - Always   Interpreter Needed?: No      Activities of Daily Living In your present state of health, do you have any difficulty performing the following activities: 01/22/2021 04/12/2020  Hearing? N -  Vision? N -  Difficulty concentrating or making decisions? Y -  Walking or climbing stairs? Y -  Dressing or bathing? N -  Doing errands, shopping? Y N  Comment Patient does not drive -  Preparing Food and eating ? Y -  Comment Wife meal prep. Self feeds. -  Using the Toilet? N -  In the past six months, have you accidently leaked urine? N -  Comment Followed by Urology and pcp -  Do you have problems with loss of bowel control? N -  Managing your Medications? Y -  Comment Wife assist -  Managing your Finances? Y -  Comment Wife assist -  Housekeeping or managing your Housekeeping? Y -  Comment Wife assist -  Some recent data might be hidden    Patient Care Team: Leone Haven, MD as PCP - General (Family Medicine) [name], [name] M, MD as PCP - Cardiology (Cardiology)  Indicate any recent Medical Services you may have received from other than [facility] providers in the past year (date may be approximate).     Assessment:   This is a routine wellness examination for  Alexander Barton.  Virtual Visit via Telephone Note  I connected with  Alexander Barton on 01/22/21 at  8:15 AM EST by telephone and verified that I am speaking with the correct person using two identifiers.  Persons participating in the virtual visit: patient/Nurse Health Advisor   I discussed the limitations, risks, security and privacy concerns of performing an evaluation and management service by telephone and the availability of in person appointments. The patient expressed understanding and agreed to proceed.  Interactive audio and video telecommunications were attempted between this nurse and patient, however failed, due to patient having technical difficulties OR patient did not have access to video capability.  We continued and completed visit with audio only.  Some vital signs may be absent or patient reported.   Hearing/Vision screen Hearing Screening - Comments:: Patient is able to hear conversational tones without difficulty. No issues reported. Vision Screening - Comments:: Followed by (Dr. C) Wears corrective lenses They have regular follow up with the ophthalmologist  Dietary issues and exercise activities discussed: Current Exercise Habits: Home exercise routine, Type of exercise: stretching;strength training/weights;walking, Frequency (Times/Week): 2, Intensity: Mild   Goals Addressed             This Visit's Progress    Follow up with Primary Care Provider       As needed       Depression Screen [facility] 2/9 Scores 01/22/2021 10/10/2020 04/09/2020 01/20/2020 01/10/2020 10/03/2019 05/25/2019  PHQ - 2 Score - 4 0 - 0 0 0  PHQ- 9 Score - - - - - - -  Exception Documentation Other- indicate reason in comment box - - Other- indicate reason in comment box - - -  Not completed Followed by psychiatry every 6 months - - Counseling with psychiatry every 60 days - - -    Fall Risk Fall Risk  01/22/2021 01/09/2021 10/10/2020 04/09/2020 01/20/2020  Falls in the past year? 0 0 0 0 0   Number falls in past yr: 0 0 0 0 0  Injury with Fall? 0 0 0 - -  Comment - - - - -  Risk for fall due to : - No Fall Risks - - -  Follow up _0     FALL RISK PREVENTION PERTAINING TO THE HOME: Home free of loose throw rugs in walkways, pet beds, electrical cords, etc? Yes  Adequate lighting in your home to reduce risk of falls? Yes   ASSISTIVE DEVICES UTILIZED TO PREVENT FALLS: Life alert? No  Use of a cane, walker or w/c? No   TIMED UP AND GO: Was the test performed? No .   Cognitive Function: Patient is alert.  MMSE - Mini Mental State Exam 01/13/2018 12/22/2016 12/05/2015  Orientation to time _1 Orientation to Place _2 Registration _3 Attention/ Calculation _4 Recall _5 Language- name 2 objects _6 Language- repeat _7 Language- follow 3 step command _8 Language- read & follow direction _9 Write a sentence _10 Copy design _11 Total score _12 6CIT Screen 01/19/2019  What Year? 0 points  What month? 0 points  What time? 0 points   Immunizations Immunization History  Administered Date(s) Administered   Fluad Quad(high Dose 65+) 11/14/2019, 10/10/2020   Influenza Split 02/14/2011   Influenza, High Dose Seasonal PF 11/04/2016, 11/23/2017   Influenza,inj,Quad PF,6+ Mos 11/14/2014   Influenza-Unspecified 11/14/2014   Moderna Sars-Covid-2 Vaccination 04/18/2019, 05/16/2019   Pneumococcal Conjugate-13 11/14/2014   Pneumococcal Polysaccharide-23 01/04/2007, 12/05/2015   Td 02/04/2000   Tdap 08/10/2012   Zoster, Live 10/04/2010    Shingrix Completed?: No.    Education has been provided regarding the importance of this vaccine. Patient has been advised to call insurance company to determine out of pocket expense if they have not yet received this vaccine. Advised may also receive vaccine at  local pharmacy or Health Dept. Verbalized acceptance and understanding.  Screening Tests Health Maintenance  Topic Date Due   OPHTHALMOLOGY EXAM  01/22/2021 (Originally 07/18/2020)   FOOT EXAM  04/03/2021 (Originally 09/24/1960)   Zoster Vaccines- Shingrix (1 of 2) 04/22/2021 (Originally 09/24/1998)   HEMOGLOBIN A1C  07/10/2021   URINE MICROALBUMIN  10/10/2021   TETANUS/TDAP  08/11/2022   COLONOSCOPY (Pts 45-84yr Insurance coverage will need to be confirmed)  10/11/2024   Pneumonia Vaccine 72 Years old  Completed   INFLUENZA VACCINE  Completed   Hepatitis C Screening  Completed   HPV VACCINES  Aged Out   COVID-19 Vaccine  Discontinued   Health Maintenance There are no preventive care reminders to display for this patient.  Lung Cancer Screening: (Low Dose CT Chest recommended if Age 72-80years, 30 pack-year currently smoking OR have quit w/in 15years.) does not qualify.   Vision Screening: Recommended annual ophthalmology exams for early detection of glaucoma and other disorders of the eye.  Dental Screening: Recommended annual dental exams for proper oral hygiene  Community Resource Referral / Chronic Care Management: CRR required this visit?  No   CCM required this visit?  No      Plan:   Keep all routine maintenance appointments.   I have personally reviewed and noted the following in the patients chart:   Medical and social history Use of alcohol, tobacco or illicit drugs  Current medications and supplements including opioid prescriptions. Patient is not currently taking opioid prescriptions. Functional ability and  status Nutritional status Physical activity Advanced directives List of other physicians Hospitalizations, surgeries, and ER visits in previous 12 months Vitals Screenings to include cognitive, depression, and falls Referrals and appointments  In addition, I have reviewed and discussed with patient certain preventive protocols, quality metrics, and  best practice recommendations. A written personalized care plan for preventive services as well as general preventive health recommendations were provided to patient.     Varney Biles, LPN   [id number]/03/[id number]

## 2021-01-22 NOTE — Patient Instructions (Addendum)
[name] , Thank you for taking time to come for your Medicare Wellness Visit. I appreciate your ongoing commitment to your health goals. Please review the following plan we discussed and let me know if I can assist you in the future.   These are the goals we discussed:  Goals      Follow up with Primary Care Provider     As needed        This is a list of the screening recommended for you and due dates:  Health Maintenance  Topic Date Due   Eye exam for diabetics  01/22/2021*   Complete foot exam   04/03/2021*   Zoster (Shingles) Vaccine (1 of 2) 04/22/2021*   Hemoglobin A1C  07/10/2021   Urine Protein Check  10/10/2021   Tetanus Vaccine  08/11/2022   Colon Cancer Screening  10/11/2024   Pneumonia Vaccine  Completed   Flu Shot  Completed   Hepatitis C Screening: USPSTF Recommendation to screen - Ages 18-79 yo.  Completed   HPV Vaccine  Aged Out   COVID-19 Vaccine  Discontinued  *Topic was postponed. The date shown is not the original due date.    Advanced directives: on file  Conditions/risks identified: none new  Follow up in one year for your annual wellness visit.   Preventive Care 4 Years and Older, Male Preventive care refers to lifestyle choices and visits with your health care provider that can promote health and wellness. What does preventive care include? A yearly physical exam. This is also called an annual well check. Dental exams once or twice a year. Routine eye exams. Ask your health care provider how often you should have your eyes checked. Personal lifestyle choices, including: Daily care of your teeth and gums. Regular physical activity. Eating a healthy diet. Avoiding tobacco and drug use. Limiting alcohol use. Practicing safe sex. Taking low doses of aspirin every day. Taking vitamin and mineral supplements as recommended by your health care provider. What happens during an annual well check? The services and screenings done by your  health care provider during your annual well check will depend on your age, overall health, lifestyle risk factors, and family history of disease. Counseling  Your health care provider may ask you questions about your: Alcohol use. Tobacco use. Drug use. Emotional well-being. Home and relationship well-being. Sexual activity. Eating habits. History of falls. Memory and ability to understand (cognition). Work and work Statistician. Screening  You may have the following tests or measurements: Height, weight, and BMI. Blood pressure. Lipid and cholesterol levels. These may be checked every 5 years, or more frequently if you are over 42 years old. Skin check. Lung cancer screening. You may have this screening every year starting at age 8 if you have a 30-pack-year history of smoking and currently smoke or have quit within the past 15 years. Fecal occult blood test (FOBT) of the stool. You may have this test every year starting at age 20. Flexible sigmoidoscopy or colonoscopy. You may have a sigmoidoscopy every 5 years or a colonoscopy every 10 years starting at age 30. Prostate cancer screening. Recommendations will vary depending on your family history and other risks. Hepatitis C blood test. Hepatitis B blood test. Sexually transmitted disease (STD) testing. Diabetes screening. This is done by checking your blood sugar (glucose) after you have not eaten for a while (fasting). You may have this done every 1-3 years. Abdominal aortic aneurysm (AAA) screening. You may need this if you are a  current or former smoker. Osteoporosis. You may be screened starting at age 20 if you are at high risk. Talk with your health care provider about your test results, treatment options, and if necessary, the need for more tests. Vaccines  Your health care provider may recommend certain vaccines, such as: Influenza vaccine. This is recommended every year. Tetanus, diphtheria, and acellular pertussis  (Tdap, Td) vaccine. You may need a Td booster every 10 years. Zoster vaccine. You may need this after age 74. Pneumococcal 13-valent conjugate (PCV13) vaccine. One dose is recommended after age 79. Pneumococcal polysaccharide (PPSV23) vaccine. One dose is recommended after age 79. Talk to your health care provider about which screenings and vaccines you need and how often you need them. This information is not intended to replace advice given to you by your health care provider. Make sure you discuss any questions you have with your health care provider. Document Released: 02/16/2015 Document Revised: 10/10/2015 Document Reviewed: 11/21/2014 Elsevier Interactive Patient Education  2017 [place] Prevention in the Home Falls can cause injuries. They can happen to people of all ages. There are many things you can do to make your home safe and to help prevent falls. What can I do on the outside of my home? Regularly fix the edges of walkways and driveways and fix any cracks. Remove anything that might make you trip as you walk through a door, such as a raised step or threshold. Trim any bushes or trees on the path to your home. Use bright outdoor lighting. Clear any walking paths of anything that might make someone trip, such as rocks or tools. Regularly check to see if handrails are loose or broken. Make sure that both sides of any steps have handrails. Any raised decks and porches should have guardrails on the edges. Have any leaves, snow, or ice cleared regularly. Use sand or salt on walking paths during winter. Clean up any spills in your garage right away. This includes oil or grease spills. What can I do in the bathroom? Use night lights. Install grab bars by the toilet and in the tub and shower. Do not use towel bars as grab bars. Use non-skid mats or decals in the tub or shower. If you need to sit down in the shower, use a plastic, non-slip stool. Keep the floor dry. Clean  up any water that spills on the floor as soon as it happens. Remove soap buildup in the tub or shower regularly. Attach bath mats securely with double-sided non-slip rug tape. Do not have throw rugs and other things on the floor that can make you trip. What can I do in the bedroom? Use night lights. Make sure that you have a light by your bed that is easy to reach. Do not use any sheets or blankets that are too big for your bed. They should not hang down onto the floor. Have a firm chair that has side arms. You can use this for support while you get dressed. Do not have throw rugs and other things on the floor that can make you trip. What can I do in the kitchen? Clean up any spills right away. Avoid walking on wet floors. Keep items that you use a lot in easy-to-reach places. If you need to reach something above you, use a strong step stool that has a grab bar. Keep electrical cords out of the way. Do not use floor polish or wax that makes floors slippery. If you must use  wax, use non-skid floor wax. Do not have throw rugs and other things on the floor that can make you trip. What can I do with my stairs? Do not leave any items on the stairs. Make sure that there are handrails on both sides of the stairs and use them. Fix handrails that are broken or loose. Make sure that handrails are as long as the stairways. Check any carpeting to make sure that it is firmly attached to the stairs. Fix any carpet that is loose or worn. Avoid having throw rugs at the top or bottom of the stairs. If you do have throw rugs, attach them to the floor with carpet tape. Make sure that you have a light switch at the top of the stairs and the bottom of the stairs. If you do not have them, ask someone to add them for you. What else can I do to help prevent falls? Wear shoes that: Do not have high heels. Have rubber bottoms. Are comfortable and fit you well. Are closed at the toe. Do not wear sandals. If you  use a stepladder: Make sure that it is fully opened. Do not climb a closed stepladder. Make sure that both sides of the stepladder are locked into place. Ask someone to hold it for you, if possible. Clearly mark and make sure that you can see: Any grab bars or handrails. First and last steps. Where the edge of each step is. Use tools that help you move around (mobility aids) if they are needed. These include: Canes. Walkers. Scooters. Crutches. Turn on the lights when you go into a dark area. Replace any light bulbs as soon as they burn out. Set up your furniture so you have a clear path. Avoid moving your furniture around. If any of your floors are uneven, fix them. If there are any pets around you, be aware of where they are. Review your medicines with your doctor. Some medicines can make you feel dizzy. This can increase your chance of falling. Ask your doctor what other things that you can do to help prevent falls. This information is not intended to replace advice given to you by your health care provider. Make sure you discuss any questions you have with your health care provider. Document Released: 11/16/2008 Document Revised: 06/28/2015 Document Reviewed: 02/24/2014 Elsevier Interactive Patient Education  2017 [employer].

## 2021-01-23 ENCOUNTER — Ambulatory Visit: Payer: Medicare PPO | Admitting: Physical Therapy

## 2021-01-23 DIAGNOSIS — R531 Weakness: Secondary | ICD-10-CM | POA: Diagnosis not present

## 2021-01-23 DIAGNOSIS — R262 Difficulty in walking, not elsewhere classified: Secondary | ICD-10-CM | POA: Diagnosis not present

## 2021-01-23 DIAGNOSIS — M25561 Pain in right knee: Secondary | ICD-10-CM | POA: Diagnosis not present

## 2021-01-23 DIAGNOSIS — M6281 Muscle weakness (generalized): Secondary | ICD-10-CM | POA: Diagnosis not present

## 2021-01-23 NOTE — Therapy (Signed)
[facility] [address], [place], [address] Phone: [phone]   Fax:  [phone]  Physical Therapy Treatment  Patient Details  Name: Alexander Barton MRN: [id number] Date of Birth: 22-Oct-1950 Referring Provider (PT): Dr. [name]   Encounter Date: 01/23/2021   PT End of Session - 01/23/21 0946     Visit Number 3    Number of Visits 16    Date for PT Re-Evaluation 03/13/21    Authorization Type [employer]    Authorization Time Period 01/14/21-04/12/21    Authorization - Visit Number 3    Authorization - Number of Visits 24    Progress Note Due on Visit 10    PT Start Time 0935    PT Stop Time 1015    PT Time Calculation (min) 40 min    Equipment Utilized During Treatment Gait belt    Activity Tolerance Patient tolerated treatment well    Behavior During Therapy WFL for tasks assessed/performed             Past Medical History:  Diagnosis Date   Aortic aneurysm, thoracic    Aortic valve disease    Arthritis    BPH (benign prostatic hyperplasia)    Complication of anesthesia    goes into depression   COPD (chronic obstructive pulmonary disease) (HCC)    GERD (gastroesophageal reflux disease)    HTN (hypertension)    Hypercholesterolemia    Stroke ([place])    TIA (transient ischemic attack)     Past Surgical History:  Procedure Laterality Date   ACHILLES TENDON REPAIR     AORTIC VALVE REPLACEMENT     #23 On-X valve conduit   ASCENDING AORTIC ANEURYSM REPAIR     BACK SURGERY     CHOLECYSTECTOMY N/A 04/12/2020   Procedure: LAPAROSCOPIC CHOLECYSTECTOMY;  Surgeon: Coralie Keens, MD;  Location: [facility];  Service: General;  Laterality: N/A;   FOOT SURGERY      There were no vitals filed for this visit.   Subjective Assessment - 01/23/21 0941     Subjective Pt reports that he had some difficulty with the prone quad stretch.    Pertinent History Per Sonnenberg's note on 01/09/21       This is a chronic ongoing issue.  It could be related to his anxiety and depression or his blood pressure being slightly low.  We will check additional lab work to evaluate further for a cause of this.  Decreased energy level: This is an ongoing issue.  He notes no chest pain or shortness of breath.  He notes after getting ready in the morning he is not able to do much for the rest of the day.  They do note his blood pressure seems to be generally stable and similar to today.  He does get lightheadedness when he stands up and they note the insurance company had a nurse come out who noted his blood pressure dropped when he stood up.  The patient's wife notes that they mentioned the lightheadedness to the atrial fibrillation doctor last week though no comment was made on this.  He reports he feels weak all over and requests physical therapy.    Limitations Standing;Walking;House hold activities    How long can you sit comfortably? N/a    How long can you stand comfortably? Can only stand for short periods of time    How long can you walk comfortably? Can only walk short distances  Patient Stated Goals Obtain home exercise program that is total body both upper body and lower body    Currently in Pain? Yes    Pain Score 7     Pain Location Knee    Pain Orientation Right    Pain Descriptors / Indicators Aching    Pain Type Chronic pain    Pain Onset More than a month ago    Pain Frequency Intermittent    Aggravating Factors  Activity and weight bearing             THEREX:  Nu-Step Level 5, Arm Length 7   Pt notes continued pain localized Gerdy's tubercle and lateral hamstring in right knee.   Sidelying IT Band with RLE hanging off side 3 x 60 sec -Pt reports not having a space available to perform exercise    Standing IT Band Stretch Stretch 3 x 60 sec   -Mod to Max VC and TC to sequence exercises   Supine IT Band Stretch with Strap 3 x 60 sec  -Pt describes feeling stretch but  difficulty with sequencing exercise   Standing HS Stretch on Mat 3 x 60 sec  -min TC for pt to straighten his leg   Sitting HS Stretch 3 x 60 sec      PT Education - 01/23/21 0945     Education Details form and technique for appropriate exercise    Person(s) Educated Patient    Methods Explanation;Demonstration;Verbal cues;Handout    Comprehension Verbalized understanding;Returned demonstration;Verbal cues required              PT Short Term Goals - 01/23/21 1035       PT SHORT TERM GOAL #1   Title Patient will demonstrate understanding of HEP to improve outcomes.    Baseline 01/16/21: NT    Time 2    Period Weeks    Status On-going    Target Date 01/30/21               PT Long Term Goals - 01/23/21 1036       PT LONG TERM GOAL #1   Title Patient will have improved function and activity level as evidenced by an increase in FOTO score by 10 points or more.    Baseline 01/16/21: 52/61    Time 8    Period Weeks    Status On-going    Target Date 03/13/21      PT LONG TERM GOAL #2   Title Patient will perform 5 x STS in <12.6 sec to demonstrate improve LE strength and that he is no longer at a risk for falls.    Baseline 01/16/21: 16 sec    Time 8    Period Weeks    Status On-going    Target Date 03/13/21      PT LONG TERM GOAL #3   Title Patient will perform >=12 chair stand repititions in 30 sec to demonstrate increase LE endurance and that is no longer at a risk for falls.    Baseline 01/16/21: 9 reps    Time 8    Period Weeks    Status On-going    Target Date 03/13/21      PT LONG TERM GOAL #4   Title Pt will increase 6MWT by at least 66m (121ft) in order to demonstrate clinically significant improvement in cardiopulmonary endurance and community ambulation    Baseline 01/16/21: NT 01/21/21: 495 m/1,625 ft    Time 8    Period  Weeks    Status On-going    Target Date 03/13/21                   Plan - 01/23/21 1028     Clinical  Impression Statement Pt presents for f/u for generlized weakness and unsteady gait secondary to right lateral knee pain. He continues to experience right lateral knee pain that appears to be muscular in origin with pain localized around gerdy's tubercle and lateral hamstring. Pain somewhat resolved with IT band stretch and hs stretch. Pt exhibits severe difficult with sequencing directions for exercises and requires mod to max verbal cueing given his cognitive deficits. Right lateral knee pain appears to be the most limiting factor for strengthening exercises that require weight bearing and her balance with right knee occasionally giving way.  He will continue to benefit from skilled PT to increase his LE strength and aerobic endurance, and to decrease his right knee pain to return to return to walking and standing to perform ADLs without increased difficulty.    Personal Factors and Comorbidities Age;Comorbidity 3+    Comorbidities AAA, BPH, T2DM, HTN, H/o Stroke , Chronic Right Knee Pain    Examination-Activity Limitations Stand;Locomotion Level;Stairs    Examination-Participation Restrictions Community Activity;Laundry;Meal Prep    Stability/Clinical Decision Making Stable/Uncomplicated    Clinical Decision Making Low    Rehab Potential Good    PT Frequency 2x / week    PT Duration 8 weeks    PT Treatment/Interventions Neuromuscular re-education;Balance training;Therapeutic exercise;Therapeutic activities;Moist Heat;Gait training;Stair training;Cryotherapy;Energy conservation;Patient/family education;Dry needling;Electrical Stimulation;Passive range of motion;Manual techniques;DME Instruction;Aquatic Therapy;Orthotic Fit/Training;Vestibular;Spinal Manipulations;Joint Manipulations;Functional mobility training    PT Next Visit Plan SLR, Sidelying Hip Abduction, Quad Exercises, Step Ups    PT Home Exercise Plan [email or web address]    Consulted and Agree with Plan of Care Patient            HEP includes the  following:  Access Code: [email or web address] URL: [email or web address] Date: 01/23/2021 Prepared by: Bradly Chris  Exercises Supine Bridge - 1 x daily - 3 x weekly - 3 sets - 10 reps Prone Quadriceps Stretch with Strap - 1 x daily - 7 x weekly - 1 sets - 3 reps - 60 hold Seated Hip External Rotation Stretch - 1 x daily - 7 x weekly - 1 sets - 3 reps - 60 hold Standing Forward Trunk Flexion - 1 x daily - 7 x weekly - 1 sets - 3 reps - 30 hold Seated Hamstring Stretch with Chair - 1 x daily - 7 x weekly - 1 sets - 3 reps - 60 hold   Patient will benefit from skilled therapeutic intervention in order to improve the following deficits and impairments:  Pain, Abnormal gait, Difficulty walking, Decreased strength, Decreased balance, Decreased cognition  Visit Diagnosis: Muscle weakness (generalized)  Difficulty in walking, not elsewhere classified  Right knee pain, unspecified chronicity     Problem List Patient Active Problem List   Diagnosis Date Noted   Urine stream spraying 01/09/2021   Fatigue 10/10/2020   Constipation 10/10/2020   Paroxysmal atrial fibrillation (HCC) 09/24/2020   Typical atrial flutter ([place]) 09/24/2020   Secondary hypercoagulable state ([place]) 09/24/2020   S/P laparoscopic cholecystectomy 04/12/2020   Globus sensation 04/09/2020   Decreased oral intake 12/02/2019   Moderate recurrent major depression ([place]) 12/02/2019   Adjustment disorder with anxiety 12/02/2019   Anxiety and depression 11/17/2019   Gallstone pancreatitis 09/21/2019   Sleep disturbance 09/21/2019   Asthma-COPD overlap syndrome ([place]) 08/26/2019  Environmental exposure 08/26/2019   Idiopathic hypoparathyroidism ([place]) 06/29/2019   PVC's (premature ventricular contractions) 06/09/2019   Pancreatic pseudocyst    Intractable abdominal pain 05/30/2019   Nausea and vomiting 05/30/2019   Hyperglycemia 05/30/2019   Pancreatic cyst    Bilateral finger numbness 01/20/2019   Headache  01/20/2019   Night sweats 01/20/2019   Educated about COVID-19 virus infection 01/05/2019   Cardiomyopathy ([place]) 01/05/2019   COVID-19 11/10/2018   Fall 05/07/2018   Costochondritis 01/04/2018   Thyroid nodule 01/04/2018   Dissecting aneurysm of thoracic aorta, Stanford type B 11/08/2017   Anemia 11/08/2017   Hyponatremia 11/08/2017   Overweight 07/01/2017   History of stroke    GERD (gastroesophageal reflux disease)    Arthritis    Neck pain 12/01/2016   Knee osteoarthritis 12/01/2016   Physical deconditioning 06/24/2016   Dyspnea on exertion 06/23/2016   Chronic pain of right knee 12/18/2015   Chronic obstructive pulmonary disease ([place]) 11/14/2014   BPH (benign prostatic hyperplasia) 11/14/2014   Long term current use of anticoagulant therapy 10/29/2013   Type 2 diabetes mellitus without complications ([place]) 38/25/0539   TIA (transient ischemic attack) 06/03/2012   H/O aortic valve replacement 12/26/2011   History of aortic arch replacement 01/07/2011   HTN (hypertension)    Hyperlipidemia    Aortic valve disease    Bradly Chris PT, DPT  01/23/2021, 10:46 AM  [facility] [address] [place], [address] Phone: [phone]   Fax:  [phone]  Name: Alexander Barton MRN: [id number] Date of Birth: August 15, 1950

## 2021-01-24 ENCOUNTER — Ambulatory Visit: Payer: Medicare PPO | Admitting: Urology

## 2021-01-24 ENCOUNTER — Other Ambulatory Visit: Payer: Self-pay

## 2021-01-24 ENCOUNTER — Encounter: Payer: Self-pay | Admitting: Urology

## 2021-01-24 VITALS — BP 109/58 | HR 87 | Ht 72.0 in | Wt 190.0 lb

## 2021-01-24 DIAGNOSIS — Z125 Encounter for screening for malignant neoplasm of prostate: Secondary | ICD-10-CM | POA: Diagnosis not present

## 2021-01-24 DIAGNOSIS — N3281 Overactive bladder: Secondary | ICD-10-CM

## 2021-01-24 DIAGNOSIS — N138 Other obstructive and reflux uropathy: Secondary | ICD-10-CM | POA: Diagnosis not present

## 2021-01-24 DIAGNOSIS — N401 Enlarged prostate with lower urinary tract symptoms: Secondary | ICD-10-CM | POA: Diagnosis not present

## 2021-01-24 DIAGNOSIS — R39198 Other difficulties with micturition: Secondary | ICD-10-CM

## 2021-01-24 LAB — BLADDER SCAN AMB NON-IMAGING

## 2021-01-24 NOTE — Patient Instructions (Signed)

## 2021-01-24 NOTE — Progress Notes (Signed)
01/24/21 12:03 PM   Alexander Barton 1950-12-24 [id number]  CC: Urinary symptoms, BPH/OAB, PSA screening  HPI: Comorbid 70 year old male with medical history notable for COPD, stroke, possible dementia, valve disease with mechanical heart valve on Coumadin who presents today with worsening urinary symptoms over the last year.  His wife helps provide most of the history.  It sound like his primary issue is some urinary urgency and frequency during the day, with some occasional urge incontinence as well as nocturia 2-4 times at night.  He can also have some problems with weak and split urinary stream.  He denies any history of UTI or retention.  He has been on Flomax long-term that he does think helps with his urination, as if he stops taking it he has noticed weakening of his urinary stream.  PSA normal at 0.25.  He drinks primarily water during the day.   Urinalysis today is benign, and PVR is normal at 124 mL.  IPSS score today is 23, with quality of life unhappy.   PMH: Past Medical History:  Diagnosis Date   Aortic aneurysm, thoracic    Aortic valve disease    Arthritis    BPH (benign prostatic hyperplasia)    Complication of anesthesia    goes into depression   COPD (chronic obstructive pulmonary disease) (HCC)    GERD (gastroesophageal reflux disease)    HTN (hypertension)    Hypercholesterolemia    Stroke (HCC)    TIA (transient ischemic attack)     Surgical History: Past Surgical History:  Procedure Laterality Date   ACHILLES TENDON REPAIR     AORTIC VALVE REPLACEMENT     #23 On-X valve conduit   ASCENDING AORTIC ANEURYSM REPAIR     BACK SURGERY     CHOLECYSTECTOMY N/A 04/12/2020   Procedure: LAPAROSCOPIC CHOLECYSTECTOMY;  Surgeon: Coralie Keens, MD;  Location: [facility];  Service: General;  Laterality: N/A;   FOOT SURGERY      Family History: Family History  Problem Relation Age of Onset   Diabetes Mother    Alzheimer's disease Father    Diabetes Father         age onset DM   Hypertension Sister    Heart attack Sister    Diabetes Sister    Hypertension Brother    Colon cancer Neg Hx    Prostate cancer Neg Hx    Bladder Cancer Neg Hx    Kidney cancer Neg Hx     Social History:  reports that he has never smoked. He has never used smokeless tobacco. He reports that he does not drink alcohol and does not use drugs.  Physical Exam: BP (!) 109/58    Pulse 87    Ht 6' (1.829 m)    Wt 190 lb (86.2 kg)    BMI 25.77 kg/m    Constitutional:  Alert and oriented, No acute distress. Cardiovascular: No clubbing, cyanosis, or edema. Respiratory: Normal respiratory effort, no increased work of breathing. GI: Abdomen is soft, nontender, nondistended, no abdominal masses  Laboratory Data: Reviewed, see HPI  Pertinent Imaging: I have personally viewed and interpreted the CT from October 2021 showing a 35 g prostate with mild bladder wall thickening, no hydronephrosis  Assessment & Plan:   70 year old comorbid male with mixed urinary symptoms of urgency, frequency, occasional urge incontinence and weak/split stream.  Normal urinalysis, normal PVR, normal PSA.  Has been on Flomax long-term.  We discussed the overlap between BPH, OAB, and urethral stricture, and I  recommended cystoscopy for further evaluation.  Follow-up for cystoscopy for further evaluation of lower urinary tract symptoms  Alexander Madrid, MD 01/24/2021  [facility] [address] [place], [place] [address] [phone]

## 2021-01-29 LAB — MICROSCOPIC EXAMINATION
Bacteria, UA: NONE SEEN
WBC, UA: NONE SEEN /hpf (ref 0–5)

## 2021-01-29 LAB — URINALYSIS, COMPLETE
Bilirubin, UA: NEGATIVE
Ketones, UA: NEGATIVE
Leukocytes,UA: NEGATIVE
Nitrite, UA: NEGATIVE
Protein,UA: NEGATIVE
RBC, UA: NEGATIVE
Specific Gravity, UA: 1.015 (ref 1.005–1.030)
Urobilinogen, Ur: 0.2 mg/dL (ref 0.2–1.0)
pH, UA: 5.5 (ref 5.0–7.5)

## 2021-01-30 ENCOUNTER — Ambulatory Visit: Payer: Medicare PPO | Admitting: Physical Therapy

## 2021-01-30 DIAGNOSIS — R262 Difficulty in walking, not elsewhere classified: Secondary | ICD-10-CM | POA: Diagnosis not present

## 2021-01-30 DIAGNOSIS — M25561 Pain in right knee: Secondary | ICD-10-CM | POA: Diagnosis not present

## 2021-01-30 DIAGNOSIS — M6281 Muscle weakness (generalized): Secondary | ICD-10-CM

## 2021-01-30 DIAGNOSIS — R531 Weakness: Secondary | ICD-10-CM | POA: Diagnosis not present

## 2021-01-30 NOTE — Therapy (Signed)
[facility] [address], [place], [address] Phone: [phone]   Fax:  [phone]  Physical Therapy Treatment  Patient Details  Name: Alexander Barton MRN: [id number] Date of Birth: [birth year] Referring Provider (PT): Dr. [name]   Encounter Date: 01/30/2021   PT End of Session - 01/30/21 1025     Visit Number 4    Number of Visits 16    Date for PT Re-Evaluation 03/13/21    Authorization Type [employer]    Authorization Time Period 01/14/21-04/12/21    Authorization - Visit Number 4    Authorization - Number of Visits 24    Progress Note Due on Visit 10    PT Start Time 0254    PT Stop Time 1100    PT Time Calculation (min) 45 min    Equipment Utilized During Treatment Gait belt    Activity Tolerance Patient tolerated treatment well    Behavior During Therapy WFL for tasks assessed/performed             Past Medical History:  Diagnosis Date   Aortic aneurysm, thoracic    Aortic valve disease    Arthritis    BPH (benign prostatic hyperplasia)    Complication of anesthesia    goes into depression   COPD (chronic obstructive pulmonary disease) (HCC)    GERD (gastroesophageal reflux disease)    HTN (hypertension)    Hypercholesterolemia    Stroke ([place])    TIA (transient ischemic attack)     Past Surgical History:  Procedure Laterality Date   ACHILLES TENDON REPAIR     AORTIC VALVE REPLACEMENT     #23 On-X valve conduit   ASCENDING AORTIC ANEURYSM REPAIR     BACK SURGERY     CHOLECYSTECTOMY N/A 04/12/2020   Procedure: LAPAROSCOPIC CHOLECYSTECTOMY;  Surgeon: Coralie Keens, MD;  Location: [facility];  Service: General;  Laterality: N/A;   FOOT SURGERY      There were no vitals filed for this visit.   Subjective Assessment - 01/30/21 1018     Subjective Pt continues to struggle with pain in his right knee. He reports the right knee almost giving out while going up stairs. He is also  struggling following exercises. Pt states that his right knee is medial rotated and that once this occured he started to experience pain. Most of his right knee pain occurs when going up stairs or hills.    Pertinent History Per Sonnenberg's note on 01/09/21      This is a chronic ongoing issue.  It could be related to his anxiety and depression or his blood pressure being slightly low.  We will check additional lab work to evaluate further for a cause of this.  Decreased energy level: This is an ongoing issue.  He notes no chest pain or shortness of breath.  He notes after getting ready in the morning he is not able to do much for the rest of the day.  They do note his blood pressure seems to be generally stable and similar to today.  He does get lightheadedness when he stands up and they note the insurance company had a nurse come out who noted his blood pressure dropped when he stood up.  The patient's wife notes that they mentioned the lightheadedness to the atrial fibrillation doctor last week though no comment was made on this.  He reports he feels weak all over and requests physical therapy.  Limitations Standing;Walking;House hold activities    How long can you sit comfortably? N/a    How long can you stand comfortably? Can only stand for short periods of time    How long can you walk comfortably? Can only walk short distances    Patient Stated Goals Obtain home exercise program that is total body both upper body and lower body    Currently in Pain? Yes    Pain Score 9     Pain Location Knee    Pain Orientation Right    Pain Descriptors / Indicators Aching    Pain Onset More than a month ago            THEREX:  TM at 2.5 mph for 10 min   Long Arch Quads 3 x 10 -min VC to maintain upright   Supine Straight Leg Raise 3 x 10   Supine Bridges 3 x 10 -min VC to reduce speed of exercise   Right knee patella position: valgus position  -Pt reports resulting lateral knee pain                 PT Education - 01/30/21 1024     Education Details form and technique for appropriate exercise    Person(s) Educated Patient    Methods Explanation    Comprehension Verbalized understanding;Returned demonstration;Verbal cues required;Tactile cues required              PT Short Term Goals - 01/30/21 1026       PT SHORT TERM GOAL #1   Title Patient will demonstrate understanding of HEP to improve outcomes.    Baseline 01/16/21: NT    Time 2    Period Weeks    Status On-going    Target Date 01/30/21               PT Long Term Goals - 01/30/21 1026       PT LONG TERM GOAL #1   Title Patient will have improved function and activity level as evidenced by an increase in FOTO score by 10 points or more.    Baseline 01/16/21: 52/61    Time 8    Period Weeks    Status On-going    Target Date 03/13/21      PT LONG TERM GOAL #2   Title Patient will perform 5 x STS in <12.6 sec to demonstrate improve LE strength and that he is no longer at a risk for falls.    Baseline 01/16/21: 16 sec    Time 8    Period Weeks    Status On-going    Target Date 03/13/21      PT LONG TERM GOAL #3   Title Patient will perform >=12 chair stand repititions in 30 sec to demonstrate increase LE endurance and that is no longer at a risk for falls.    Baseline 01/16/21: 9 reps    Time 8    Period Weeks    Status On-going    Target Date 03/13/21      PT LONG TERM GOAL #4   Title Pt will increase 6MWT by at least 46m (164ft) in order to demonstrate clinically significant improvement in cardiopulmonary endurance and community ambulation    Baseline 01/16/21: NT 01/21/21: 495 m/1,625 ft    Time 8    Period Weeks    Status On-going    Target Date 03/13/21  Plan - 01/30/21 1025     Clinical Impression Statement Pt presents for f/u for generalized weakness and unsteady gait secondary to right lateral knee pain that occured from an old  football injury.  He continues to exhibit signs and symptoms suggestive of PFPS with pain with increased flexion with activities like going up stairs or walking up hills.  This right knee pain is resulting in RLE instability with decreased steadiness with gait or "knee giving out". Exercises modified for decreased knee flexion and increased quad activation and hip abduction and quad stretching. He will continue to benefit from skilled PT to decrease right knee pain and increase right knee stability to prevent knee giving out and to increase steadiness with gait.    Personal Factors and Comorbidities Age;Comorbidity 3+    Comorbidities AAA, BPH, T2DM, HTN, H/o Stroke , Chronic Right Knee Pain    Examination-Activity Limitations Stand;Locomotion Level;Stairs    Examination-Participation Restrictions Community Activity;Laundry;Meal Prep    Stability/Clinical Decision Making Stable/Uncomplicated    Clinical Decision Making Low    Rehab Potential Good    PT Frequency 2x / week    PT Duration 8 weeks    PT Treatment/Interventions Neuromuscular re-education;Balance training;Therapeutic exercise;Therapeutic activities;Moist Heat;Gait training;Stair training;Cryotherapy;Energy conservation;Patient/family education;Dry needling;Electrical Stimulation;Passive range of motion;Manual techniques;DME Instruction;Aquatic Therapy;Orthotic Fit/Training;Vestibular;Spinal Manipulations;Joint Manipulations;Functional mobility training    PT Next Visit Plan Progress knee strengthening exercises include standing HS curls and heel raises    PT Home Exercise Plan [email or web address]    Consulted and Agree with Plan of Care Patient            HEP includes the following:  Access Code: [email or web address] URL: [email or web address] Date: 01/30/2021 Prepared by: Bradly Chris  Exercises Supine Bridge - 1 x daily - 3 x weekly - 3 sets - 10 reps Seated Long Arc Quad - 1 x daily - 3 x weekly - 3 sets - 10 reps Active  Straight Leg Raise with Quad Set - 1 x daily - 3 x weekly - 3 sets - 10 reps Sidelying Quadriceps Stretch with Strap - 1 x daily - 7 x weekly - 1 sets - 3 reps - 60 hold Sidelying Hip Abduction - 1 x daily - 3 x weekly - 3 sets - 10 reps Standing Forward Trunk Flexion - 1 x daily - 7 x weekly - 1 sets - 3 reps - 30 hold   Patient will benefit from skilled therapeutic intervention in order to improve the following deficits and impairments:  Pain, Abnormal gait, Difficulty walking, Decreased strength, Decreased balance, Decreased cognition  Visit Diagnosis: Muscle weakness (generalized)  Difficulty in walking, not elsewhere classified  Right knee pain, unspecified chronicity     Problem List Patient Active Problem List   Diagnosis Date Noted   Urine stream spraying 01/09/2021   Fatigue 10/10/2020   Constipation 10/10/2020   Paroxysmal atrial fibrillation (HCC) 09/24/2020   Typical atrial flutter (HCC) 09/24/2020   Secondary hypercoagulable state ([place]) 09/24/2020   S/P laparoscopic cholecystectomy 04/12/2020   Globus sensation 04/09/2020   Decreased oral intake 12/02/2019   Moderate recurrent major depression ([place]) 12/02/2019   Adjustment disorder with anxiety 12/02/2019   Anxiety and depression 11/17/2019   Gallstone pancreatitis 09/21/2019   Sleep disturbance 09/21/2019   Asthma-COPD overlap syndrome ([employer]) 08/26/2019   Environmental exposure 08/26/2019   Idiopathic hypoparathyroidism ([place]) 06/29/2019   PVC's (premature ventricular contractions) 06/09/2019   Pancreatic pseudocyst    Intractable abdominal pain 05/30/2019   Nausea and vomiting 05/30/2019  Hyperglycemia 05/30/2019   Pancreatic cyst    Bilateral finger numbness 01/20/2019   Headache 01/20/2019   Night sweats 01/20/2019   Educated about COVID-19 virus infection 01/05/2019   Cardiomyopathy ([place]) 01/05/2019   COVID-19 11/10/2018   Fall 05/07/2018   Costochondritis 01/04/2018   Thyroid nodule 01/04/2018    Dissecting aneurysm of thoracic aorta, Stanford type B 11/08/2017   Anemia 11/08/2017   Hyponatremia 11/08/2017   Overweight 07/01/2017   History of stroke    GERD (gastroesophageal reflux disease)    Arthritis    Neck pain 12/01/2016   Knee osteoarthritis 12/01/2016   Physical deconditioning 06/24/2016   Dyspnea on exertion 06/23/2016   Chronic pain of right knee 12/18/2015   Chronic obstructive pulmonary disease ([place]) 11/14/2014   BPH (benign prostatic hyperplasia) 11/14/2014   Long term current use of anticoagulant therapy 10/29/2013   Type 2 diabetes mellitus without complications ([place]) 01/26/4974   TIA (transient ischemic attack) 06/03/2012   H/O aortic valve replacement 12/26/2011   History of aortic arch replacement 01/07/2011   HTN (hypertension)    Hyperlipidemia    Aortic valve disease    Bradly Chris PT, DPT  01/30/2021, 1:19 PM   [facility] [address] [place], [address] Phone: [phone]   Fax:  [phone]  Name: Alexander Barton MRN: [id number] Date of Birth: November 27, 1950

## 2021-02-05 ENCOUNTER — Telehealth: Payer: Self-pay

## 2021-02-05 ENCOUNTER — Ambulatory Visit: Payer: Medicare PPO | Attending: Family Medicine

## 2021-02-05 ENCOUNTER — Encounter: Payer: Self-pay | Admitting: Physical Therapy

## 2021-02-05 DIAGNOSIS — M545 Low back pain, unspecified: Secondary | ICD-10-CM | POA: Insufficient documentation

## 2021-02-05 DIAGNOSIS — G8929 Other chronic pain: Secondary | ICD-10-CM | POA: Diagnosis not present

## 2021-02-05 DIAGNOSIS — Z7901 Long term (current) use of anticoagulants: Secondary | ICD-10-CM | POA: Diagnosis not present

## 2021-02-05 DIAGNOSIS — M6281 Muscle weakness (generalized): Secondary | ICD-10-CM | POA: Diagnosis not present

## 2021-02-05 DIAGNOSIS — M25561 Pain in right knee: Secondary | ICD-10-CM | POA: Diagnosis not present

## 2021-02-05 DIAGNOSIS — R262 Difficulty in walking, not elsewhere classified: Secondary | ICD-10-CM | POA: Insufficient documentation

## 2021-02-05 LAB — POCT INR: INR: 2.6 (ref 2.0–3.0)

## 2021-02-05 NOTE — Therapy (Signed)
[facility] [address] [place], [place], [address] Phone: [phone]   Fax:  [phone]  Physical Therapy Treatment  Patient Details  Name: Alexander Barton MRN: [id number] Date of Birth: [birth year] Referring Provider (PT): Dr. [name]   Encounter Date: 02/05/2021   PT End of Session - 02/05/21 1255     Visit Number 5    Number of Visits 16    Date for PT Re-Evaluation 03/13/21    Authorization Type [employer]    Authorization Time Period 01/14/21-04/12/21    Authorization - Visit Number 5    Authorization - Number of Visits 24    Progress Note Due on Visit 10    PT Start Time 0017    PT Stop Time 1105    PT Time Calculation (min) 50 min    Activity Tolerance Patient tolerated treatment well    Behavior During Therapy [facility] for tasks assessed/performed             Past Medical History:  Diagnosis Date   Aortic aneurysm, thoracic    Aortic valve disease    Arthritis    BPH (benign prostatic hyperplasia)    Complication of anesthesia    goes into depression   COPD (chronic obstructive pulmonary disease) (HCC)    GERD (gastroesophageal reflux disease)    HTN (hypertension)    Hypercholesterolemia    Stroke ([place])    TIA (transient ischemic attack)     Past Surgical History:  Procedure Laterality Date   ACHILLES TENDON REPAIR     AORTIC VALVE REPLACEMENT     #23 On-X valve conduit   ASCENDING AORTIC ANEURYSM REPAIR     BACK SURGERY     CHOLECYSTECTOMY N/A 04/12/2020   Procedure: LAPAROSCOPIC CHOLECYSTECTOMY;  Surgeon: Coralie Keens, MD;  Location: [facility];  Service: General;  Laterality: N/A;   FOOT SURGERY      There were no vitals filed for this visit.   Subjective Assessment - 02/05/21 1028     Subjective Pt continues to report R knee pain, currently 3-4/10 NPS at rest. 5-7/10 with level wlaking. Did experience new onset of L knee pain medially ~5 days ago but not currently bothering.     Pertinent History Per Sonnenberg's note on 01/09/21      This is a chronic ongoing issue.  It could be related to his anxiety and depression or his blood pressure being slightly low.  We will check additional lab work to evaluate further for a cause of this.  Decreased energy level: This is an ongoing issue.  He notes no chest pain or shortness of breath.  He notes after getting ready in the morning he is not able to do much for the rest of the day.  They do note his blood pressure seems to be generally stable and similar to today.  He does get lightheadedness when he stands up and they note the insurance company had a nurse come out who noted his blood pressure dropped when he stood up.  The patient's wife notes that they mentioned the lightheadedness to the atrial fibrillation doctor last week though no comment was made on this.  He reports he feels weak all over and requests physical therapy.    Limitations Standing;Walking;House hold activities    How long can you sit comfortably? N/a    How long can you stand comfortably? Can only stand for short periods of time    How long can  you walk comfortably? Can only walk short distances    Patient Stated Goals Obtain home exercise program that is total body both upper body and lower body    Currently in Pain? Yes    Pain Score 4     Pain Location Knee    Pain Orientation Right    Pain Descriptors / Indicators Aching    Pain Type Chronic pain    Pain Onset More than a month ago    Pain Frequency Intermittent             BP: 123/62 mm Hg, HR: 95 BPM   There.ex:   Amb on treadmill 5 minutes for warm-up at 2 MPH. Demo's good reciprocal pattern, consistent heel strike. Does have reduction in R knee pain with slowing of cadence.   Standing R hamstring curls with BUE support: x10 no resistance. 2x12 with 3# AW's. No pain. Initial demo but no cuing after.   Standing B heel raises: 2x15, BUE support. Good form/technique   L side-lying Quad  stretch: 3x30 sec. Did require increased time, max multimodal cues, multiple demos with fair carryover after. Spent significant time today for improved compliance of HEP at home with quad mobility.    PT Education - 02/05/21 1031     Education Details form/technique with exercise.    Person(s) Educated Patient    Methods Explanation;Demonstration;Verbal cues    Comprehension Verbalized understanding;Returned demonstration;Verbal cues required              PT Short Term Goals - 01/30/21 1026       PT SHORT TERM GOAL #1   Title Patient will demonstrate understanding of HEP to improve outcomes.    Baseline 01/16/21: NT    Time 2    Period Weeks    Status On-going    Target Date 01/30/21               PT Long Term Goals - 01/30/21 1026       PT LONG TERM GOAL #1   Title Patient will have improved function and activity level as evidenced by an increase in FOTO score by 10 points or more.    Baseline 01/16/21: 52/61    Time 8    Period Weeks    Status On-going    Target Date 03/13/21      PT LONG TERM GOAL #2   Title Patient will perform 5 x STS in <12.6 sec to demonstrate improve LE strength and that he is no longer at a risk for falls.    Baseline 01/16/21: 16 sec    Time 8    Period Weeks    Status On-going    Target Date 03/13/21      PT LONG TERM GOAL #3   Title Patient will perform >=12 chair stand repititions in 30 sec to demonstrate increase LE endurance and that is no longer at a risk for falls.    Baseline 01/16/21: 9 reps    Time 8    Period Weeks    Status On-going    Target Date 03/13/21      PT LONG TERM GOAL #4   Title Pt will increase 6MWT by at least 27m (139ft) in order to demonstrate clinically significant improvement in cardiopulmonary endurance and community ambulation    Baseline 01/16/21: NT 01/21/21: 495 m/1,625 ft    Time 8    Period Weeks    Status On-going    Target Date 03/13/21  Plan - 02/05/21  1256     Clinical Impression Statement Pt limited in progression of thereapeutic exercise today due to significant need for education, direction, and cuing for HEP compliance. Pt has difficulty with form/technique with quad stretching requiring max demo, tactile, and verbal cuing with fair carryover. Pt did display improved R knee pain with decrease in cadence on treadmill overall. Pt will continue to benefit from skilled PT to improve knee pain, gait, and gait instability to reduce risk of falls.    Personal Factors and Comorbidities Age;Comorbidity 3+    Comorbidities AAA, BPH, T2DM, HTN, H/o Stroke , Chronic Right Knee Pain    Examination-Activity Limitations Stand;Locomotion Level;Stairs    Examination-Participation Restrictions Community Activity;Laundry;Meal Prep    Stability/Clinical Decision Making Stable/Uncomplicated    Rehab Potential Good    PT Frequency 2x / week    PT Duration 8 weeks    PT Treatment/Interventions Neuromuscular re-education;Balance training;Therapeutic exercise;Therapeutic activities;Moist Heat;Gait training;Stair training;Cryotherapy;Energy conservation;Patient/family education;Dry needling;Electrical Stimulation;Passive range of motion;Manual techniques;DME Instruction;Aquatic Therapy;Orthotic Fit/Training;Vestibular;Spinal Manipulations;Joint Manipulations;Functional mobility training    PT Next Visit Plan Progress knee strengthening exercises include standing HS curls and heel raises    PT Home Exercise Plan [email or web address]    Consulted and Agree with Plan of Care Patient             Patient will benefit from skilled therapeutic intervention in order to improve the following deficits and impairments:  Pain, Abnormal gait, Difficulty walking, Decreased strength, Decreased balance, Decreased cognition  Visit Diagnosis: Muscle weakness (generalized)  Difficulty in walking, not elsewhere classified  Right knee pain, unspecified chronicity     Problem  List Patient Active Problem List   Diagnosis Date Noted   Urine stream spraying 01/09/2021   Fatigue 10/10/2020   Constipation 10/10/2020   Paroxysmal atrial fibrillation ([place]) 09/24/2020   Typical atrial flutter ([place]) 09/24/2020   Secondary hypercoagulable state ([place]) 09/24/2020   S/P laparoscopic cholecystectomy 04/12/2020   Globus sensation 04/09/2020   Decreased oral intake 12/02/2019   Moderate recurrent major depression ([place]) 12/02/2019   Adjustment disorder with anxiety 12/02/2019   Anxiety and depression 11/17/2019   Gallstone pancreatitis 09/21/2019   Sleep disturbance 09/21/2019   Asthma-COPD overlap syndrome ([place]) 08/26/2019   Environmental exposure 08/26/2019   Idiopathic hypoparathyroidism ([place]) 06/29/2019   PVC's (premature ventricular contractions) 06/09/2019   Pancreatic pseudocyst    Intractable abdominal pain 05/30/2019   Nausea and vomiting 05/30/2019   Hyperglycemia 05/30/2019   Pancreatic cyst    Bilateral finger numbness 01/20/2019   Headache 01/20/2019   Night sweats 01/20/2019   Educated about COVID-19 virus infection 01/05/2019   Cardiomyopathy ([place]) 01/05/2019   COVID-19 11/10/2018   Fall 05/07/2018   Costochondritis 01/04/2018   Thyroid nodule 01/04/2018   Dissecting aneurysm of thoracic aorta, Stanford type B 11/08/2017   Anemia 11/08/2017   Hyponatremia 11/08/2017   Overweight 07/01/2017   History of stroke    GERD (gastroesophageal reflux disease)    Arthritis    Neck pain 12/01/2016   Knee osteoarthritis 12/01/2016   Physical deconditioning 06/24/2016   Dyspnea on exertion 06/23/2016   Chronic pain of right knee 12/18/2015   Chronic obstructive pulmonary disease ([place]) 11/14/2014   BPH (benign prostatic hyperplasia) 11/14/2014   Long term current use of anticoagulant therapy 10/29/2013   Type 2 diabetes mellitus without complications ([place]) 36/62/9476   TIA (transient ischemic attack) 06/03/2012   H/O aortic valve replacement 12/26/2011    History of aortic arch replacement 01/07/2011  HTN (hypertension)    Hyperlipidemia    Aortic valve disease     Salem Caster. Fairly IV, PT, DPT Physical Therapist- [facility]  02/05/2021, 1:05 PM  [facility] [address] [place], [address] Phone: [phone]   Fax:  [phone]  Name: MASAMI PLATA MRN: [id number] Date of Birth: 1950/10/08

## 2021-02-05 NOTE — Telephone Encounter (Signed)
Lmom for overdue inr 

## 2021-02-06 ENCOUNTER — Ambulatory Visit (INDEPENDENT_AMBULATORY_CARE_PROVIDER_SITE_OTHER): Payer: Medicare PPO | Admitting: Internal Medicine

## 2021-02-06 DIAGNOSIS — I359 Nonrheumatic aortic valve disorder, unspecified: Secondary | ICD-10-CM | POA: Diagnosis not present

## 2021-02-06 DIAGNOSIS — Z5181 Encounter for therapeutic drug level monitoring: Secondary | ICD-10-CM

## 2021-02-06 DIAGNOSIS — Z7901 Long term (current) use of anticoagulants: Secondary | ICD-10-CM

## 2021-02-07 ENCOUNTER — Ambulatory Visit: Payer: Medicare PPO | Admitting: Physical Therapy

## 2021-02-11 ENCOUNTER — Ambulatory Visit: Payer: Medicare PPO | Admitting: Cardiology

## 2021-02-11 ENCOUNTER — Other Ambulatory Visit: Payer: Self-pay | Admitting: Family Medicine

## 2021-02-11 ENCOUNTER — Ambulatory Visit: Payer: Medicare PPO | Admitting: Physical Therapy

## 2021-02-12 ENCOUNTER — Encounter: Payer: Medicare PPO | Admitting: Physical Therapy

## 2021-02-12 ENCOUNTER — Ambulatory Visit: Payer: Medicare PPO | Admitting: Physical Therapy

## 2021-02-12 DIAGNOSIS — R262 Difficulty in walking, not elsewhere classified: Secondary | ICD-10-CM | POA: Diagnosis not present

## 2021-02-12 DIAGNOSIS — M6281 Muscle weakness (generalized): Secondary | ICD-10-CM | POA: Diagnosis not present

## 2021-02-12 DIAGNOSIS — G8929 Other chronic pain: Secondary | ICD-10-CM | POA: Diagnosis not present

## 2021-02-12 DIAGNOSIS — M25561 Pain in right knee: Secondary | ICD-10-CM | POA: Diagnosis not present

## 2021-02-12 DIAGNOSIS — M545 Low back pain, unspecified: Secondary | ICD-10-CM | POA: Diagnosis not present

## 2021-02-12 NOTE — Therapy (Signed)
[facility] [address], [place], [address] Phone: [phone]   Fax:  [phone]  Physical Therapy Treatment/ Discharge Summary    Discharge Summary Dates of Reporting: 01/16/21-02/12/21  Patient Details  Name: Alexander Barton MRN: [id number] Date of Birth: [birth year] Referring Provider (PT): Dr. [name]   Encounter Date: 02/12/2021   PT End of Session - 02/12/21 1453     Visit Number 6    Number of Visits 16    Date for PT Re-Evaluation 03/13/21    Authorization Type [employer]    Authorization Time Period 01/14/21-04/12/21    Authorization - Visit Number 6    Authorization - Number of Visits 24    Progress Note Due on Visit 10    PT Start Time 1100    PT Stop Time 9518    PT Time Calculation (min) 45 min    Activity Tolerance Patient tolerated treatment well    Behavior During Therapy [facility] for tasks assessed/performed             Past Medical History:  Diagnosis Date   Aortic aneurysm, thoracic    Aortic valve disease    Arthritis    BPH (benign prostatic hyperplasia)    Complication of anesthesia    goes into depression   COPD (chronic obstructive pulmonary disease) (HCC)    GERD (gastroesophageal reflux disease)    HTN (hypertension)    Hypercholesterolemia    Stroke ([place])    TIA (transient ischemic attack)     Past Surgical History:  Procedure Laterality Date   ACHILLES TENDON REPAIR     AORTIC VALVE REPLACEMENT     #23 On-X valve conduit   ASCENDING AORTIC ANEURYSM REPAIR     BACK SURGERY     CHOLECYSTECTOMY N/A 04/12/2020   Procedure: LAPAROSCOPIC CHOLECYSTECTOMY;  Surgeon: Coralie Keens, MD;  Location: [facility];  Service: General;  Laterality: N/A;   FOOT SURGERY      There were no vitals filed for this visit.   Subjective Assessment - 02/12/21 1109     Subjective Pt reports that he does not feel knee pain when walking on level ground and that pain is not constant.  However, he does feel increased L knee pain when stepping up on surfaces and walking on uneven surfaces.    Pertinent History Per Sonnenberg's note on 01/09/21      This is a chronic ongoing issue.  It could be related to his anxiety and depression or his blood pressure being slightly low.  We will check additional lab work to evaluate further for a cause of this.  Decreased energy level: This is an ongoing issue.  He notes no chest pain or shortness of breath.  He notes after getting ready in the morning he is not able to do much for the rest of the day.  They do note his blood pressure seems to be generally stable and similar to today.  He does get lightheadedness when he stands up and they note the insurance company had a nurse come out who noted his blood pressure dropped when he stood up.  The patient's wife notes that they mentioned the lightheadedness to the atrial fibrillation doctor last week though no comment was made on this.  He reports he feels weak all over and requests physical therapy.    Limitations Standing;Walking;House hold activities    How long can you sit comfortably? N/a    How long can  you stand comfortably? Can only stand for short periods of time    How long can you walk comfortably? Can only walk short distances    Patient Stated Goals Obtain home exercise program that is total body both upper body and lower body    Currently in Pain? No/denies    Pain Location Knee    Pain Orientation Right    Pain Descriptors / Indicators Aching    Pain Type Chronic pain    Pain Onset More than a month ago                THEREX:     6 MINUTE WALK TEST: - distance ambulated: 1600 feet or 488 meters ( 102% of age and gender matched norm) - assistive device used: none - supplemental O2 requirements/used: none  Community-dwelling Elderly (>60 yrs of age) who are nonsmokers and have no history of dizziness Norms Mean Distance in Meters by Age & Gender Dorene Grebe et al 2002)         Age         Male         Male              70-79 yrs      28 m         4 m     5 Times Sit to Stand: 15 sec  (Individuals with times that exceed the listed time have worse than average performance - predictive of recurrent falls in healthy community-living subjects)         - 70-79 y.o. 12.6 sec     30 sec chair stands : 10 reps  <12 Men 70-74  *A below average score indicates a risk for falls  Bridge Marches 3 x 10  Standing Heel Raises 1 x 10          PT Short Term Goals - 02/12/21 1113       PT SHORT TERM GOAL #1   Title Patient will demonstrate understanding of HEP to improve outcomes.    Baseline 01/16/21: NT    Time 2    Period Weeks    Status Achieved    Target Date 01/30/21               PT Long Term Goals - 02/12/21 1445       PT LONG TERM GOAL #1   Title Patient will have improved function and activity level as evidenced by an increase in FOTO score by 10 points or more.    Baseline 01/16/21: 52/61 02/12/21: 53/61    Time 8    Period Weeks    Status Not Met    Target Date 03/13/21      PT LONG TERM GOAL #2   Title Patient will perform 5 x STS in <12.6 sec to demonstrate improve LE strength and that he is no longer at a risk for falls.    Baseline 01/16/21: 16 sec 02/12/21: 15 sec    Time 8    Period Weeks    Status Not Met    Target Date 03/13/21      PT LONG TERM GOAL #3   Title Patient will perform >=12 chair stand repititions in 30 sec to demonstrate increase LE endurance and that is no longer at a risk for falls.    Baseline 01/16/21: 9 reps 02/12/21: 10 reps    Time 8    Period Weeks    Status Not Met  Target Date 03/13/21      PT LONG TERM GOAL #4   Title Pt will increase by at least 60m (170ft) in order to demonstrate clinically significant improvement in cardiopulmonary endurance and community ambulation    Baseline 01/16/21: NT 01/21/21: 495 m/1,625 ft 02/12/21: 1,600 ft or 488 meters    Time 8    Period Weeks     Status Not Met    Target Date 03/13/21                   Plan - 02/12/21 1111     Clinical Impression Statement Pt presents for final visit and f/u for imbalance due to mechanical falls from right knee pain. He continues to exhibit limited progress with resolution of right knee pain which is limiting his ability to perform LE strengthening and balance exercises. Pt shows minimal to no progress with aerobic endurance and LE strength and endurance. PT recommends that pt seek out additional medical treatment for further evaluation and treatment of right knee. Pt provided with HEP to bridge his care and to continue strengthening surrounding right knee musculature.    Personal Factors and Comorbidities Age;Comorbidity 3+    Comorbidities AAA, BPH, T2DM, HTN, H/o Stroke , Chronic Right Knee Pain    Examination-Activity Limitations Stand;Locomotion Level;Stairs    Examination-Participation Restrictions Community Activity;Laundry;Meal Prep    Stability/Clinical Decision Making Stable/Uncomplicated    Clinical Decision Making Low    Rehab Potential Good    PT Frequency 2x / week    PT Duration 8 weeks    PT Treatment/Interventions Neuromuscular re-education;Balance training;Therapeutic exercise;Therapeutic activities;Moist Heat;Gait training;Stair training;Cryotherapy;Energy conservation;Patient/family education;Dry needling;Electrical Stimulation;Passive range of motion;Manual techniques;DME Instruction;Aquatic Therapy;Orthotic Fit/Training;Vestibular;Spinal Manipulations;Joint Manipulations;Functional mobility training    PT Next Visit Plan Discharge and follow HEP    PT Home Exercise Plan [email or web address]    Recommended Other Services Orthopedist for further eval and treatment of right knee    Consulted and Agree with Plan of Care Patient            HEP includes the following:  Access Code: [email or web address] URL: [email or web address] Date: 02/12/2021 Prepared by: Ellin Goodie  Exercises Standing Forward Trunk Flexion - 1 x daily - 7 x weekly - 1 sets - 3 reps - 30 hold Seated Long Arc Quad - 1 x daily - 3 x weekly - 3 sets - 10 reps Sidelying Quadriceps Stretch with Strap - 1 x daily - 7 x weekly - 1 sets - 3 reps - 60 hold Sidelying Hip Abduction - 1 x daily - 3 x weekly - 3 sets - 10 reps Supine Active Straight Leg Raise - 1 x daily - 3 x weekly - 3 sets - 10 reps Bridge Marches 3 x 10 x 3 days per week    Patient will benefit from skilled therapeutic intervention in order to improve the following deficits and impairments:  Pain, Abnormal gait, Difficulty walking, Decreased strength, Decreased balance, Decreased cognition  Visit Diagnosis: Muscle weakness (generalized)  Difficulty in walking, not elsewhere classified  Right knee pain, unspecified chronicity  Chronic bilateral low back pain, unspecified whether sciatica present     Problem List Patient Active Problem List   Diagnosis Date Noted   Urine stream spraying 01/09/2021   Fatigue 10/10/2020   Constipation 10/10/2020   Paroxysmal atrial fibrillation (HCC) 09/24/2020   Typical atrial flutter (HCC) 09/24/2020   Secondary hypercoagulable state (HCC) 09/24/2020   S/P laparoscopic cholecystectomy 04/12/2020   Globus sensation 04/09/2020  Decreased oral intake 12/02/2019   Moderate recurrent major depression ([place]) 12/02/2019   Adjustment disorder with anxiety 12/02/2019   Anxiety and depression 11/17/2019   Gallstone pancreatitis 09/21/2019   Sleep disturbance 09/21/2019   Asthma-COPD overlap syndrome ([place]) 08/26/2019   Environmental exposure 08/26/2019   Idiopathic hypoparathyroidism ([place]) 06/29/2019   PVC's (premature ventricular contractions) 06/09/2019   Pancreatic pseudocyst    Intractable abdominal pain 05/30/2019   Nausea and vomiting 05/30/2019   Hyperglycemia 05/30/2019   Pancreatic cyst    Bilateral finger numbness 01/20/2019   Headache 01/20/2019   Night sweats  01/20/2019   Educated about COVID-19 virus infection 01/05/2019   Cardiomyopathy ([place]) 01/05/2019   COVID-19 11/10/2018   Fall 05/07/2018   Costochondritis 01/04/2018   Thyroid nodule 01/04/2018   Dissecting aneurysm of thoracic aorta, Stanford type B 11/08/2017   Anemia 11/08/2017   Hyponatremia 11/08/2017   Overweight 07/01/2017   History of stroke    GERD (gastroesophageal reflux disease)    Arthritis    Neck pain 12/01/2016   Knee osteoarthritis 12/01/2016   Physical deconditioning 06/24/2016   Dyspnea on exertion 06/23/2016   Chronic pain of right knee 12/18/2015   Chronic obstructive pulmonary disease ([place]) 11/14/2014   BPH (benign prostatic hyperplasia) 11/14/2014   Long term current use of anticoagulant therapy 10/29/2013   Type 2 diabetes mellitus without complications ([place]) 54/49/2010   TIA (transient ischemic attack) 06/03/2012   H/O aortic valve replacement 12/26/2011   History of aortic arch replacement 01/07/2011   HTN (hypertension)    Hyperlipidemia    Aortic valve disease    Bradly Chris PT, DPT  02/12/2021, 2:54 PM  [facility] [address], [place], [address] Phone: [phone]   Fax:  [phone]  Name: Alexander Barton MRN: [id number] Date of Birth: 05-02-1950

## 2021-02-13 ENCOUNTER — Ambulatory Visit: Payer: Medicare PPO | Admitting: Physical Therapy

## 2021-02-14 ENCOUNTER — Encounter: Payer: Medicare PPO | Admitting: Physical Therapy

## 2021-02-14 ENCOUNTER — Ambulatory Visit: Payer: Medicare PPO | Admitting: Urology

## 2021-02-14 ENCOUNTER — Other Ambulatory Visit: Payer: Self-pay

## 2021-02-14 ENCOUNTER — Encounter: Payer: Self-pay | Admitting: Urology

## 2021-02-14 VITALS — BP 119/61 | HR 80 | Ht 72.0 in | Wt 190.0 lb

## 2021-02-14 DIAGNOSIS — N138 Other obstructive and reflux uropathy: Secondary | ICD-10-CM | POA: Diagnosis not present

## 2021-02-14 DIAGNOSIS — N401 Enlarged prostate with lower urinary tract symptoms: Secondary | ICD-10-CM | POA: Diagnosis not present

## 2021-02-14 MED ORDER — LIDOCAINE HCL URETHRAL/MUCOSAL 2 % EX GEL
1.0000 "application " | Freq: Once | CUTANEOUS | Status: AC
  Administered 2021-02-14: 1 via URETHRAL

## 2021-02-14 NOTE — Patient Instructions (Signed)
Prostatic Urethral Lift Prostatic urethral lift is a surgical procedure to treat symptoms of prostate gland enlargement that occurs with age (benign prostatic hypertrophy, BPH). The urethra passes between the two lobes of the prostate. The urethra is the part of the body that drains urine from the bladder. As the prostate enlarges, it can push on the urethra and cause problems with urinating. This procedure involves placing an implant that holds the prostate away from the urethra. The procedure is done using a thin device called a cystoscope. The device is inserted through the tip of the penis and moved up the urethra to the prostate. This is less invasive than other procedures that require an incision. You may have this procedure if: You have symptoms of BPH. Your prostate is not severely enlarged. Medicines to treat BPH are not working or not tolerated. You want to avoid possible sexual side effects from medicines or other procedures that are used to treat BPH. Tell a health care provider about: Any allergies you have. All medicines you are taking, including vitamins, herbs, eye drops, creams, and over-the-counter medicines. Any problems you or family members have had with anesthetic medicines. Any bleeding problems you have. Any surgeries you have had. Any medical conditions you have. What are the risks? Generally, this is a safe procedure. However, problems may occur, including: Bleeding. Infection. Leaking of urine (incontinence). Allergic reactions to medicines. Return of BPH symptoms after 2 years, requiring more treatment. What happens before the procedure? When to stop eating and drinking Follow instructions from your health care provider about what you may eat and drink before your procedure. These may include: 8 hours before your procedure Stop eating most foods. Do not eat meat, fried foods, or fatty foods. Eat only light foods, such as toast or crackers. All liquids are okay  except energy drinks and alcohol. 6 hours before your procedure Stop eating. Drink only clear liquids, such as water, clear fruit juice, black coffee, plain tea, and sports drinks. Do not drink energy drinks or alcohol. 2 hours before your procedure Stop drinking all liquids. You may be allowed to take medicines with small sips of water. If you do not follow your health care provider's instructions, your procedure may be delayed or canceled. Medicines Ask your health care provider about: Changing or stopping your regular medicines. This is especially important if you are taking diabetes medicines or blood thinners. Taking medicines such as aspirin and ibuprofen. These medicines can thin your blood. Do not take these medicines unless your health care provider tells you to take them. Taking over-the-counter medicines, vitamins, herbs, and supplements. Surgery safety Ask your health care provider what steps will be taken to help prevent infection. These steps may include: Removing hair at the surgery site. Washing skin with a germ-killing soap. Taking antibiotic medicine. General instructions Do not use any products that contain nicotine or tobacco for at least 4 weeks before the procedure. These products include cigarettes, chewing tobacco, and vaping devices, such as e-cigarettes. If you need help quitting, ask your health care provider. If you will be going home right after the procedure, plan to have a responsible adult: Take you home from the hospital or clinic. You will not be allowed to drive. Care for you for the time you are told. What happens during the procedure? An IV may be inserted into one of your veins. You will be given one or more of the following: A medicine to help you relax (sedative). A medicine that is  injected into your urethra to numb the area (local anesthetic). A medicine to make you fall asleep (general anesthetic). A cystoscope will be inserted into your penis  and moved through your urethra to your prostate. A device will be inserted through the cystoscope and used to press the lobes of your prostate away from your urethra. Implants will be inserted through the device to hold the lobes of your prostate in the widened position. The device and cystoscope will be removed. The procedure may vary among health care providers and hospitals. What happens after the procedure? Your blood pressure, heart rate, breathing rate, and blood oxygen level be monitored until you leave the hospital or clinic. If you were given a sedative during the procedure, it can affect you for several hours. Do not drive or operate machinery until your health care provider says that it is safe. Summary Prostatic urethral lift is a surgical procedure to relieve symptoms of prostate gland enlargement that occurs with age (benign prostatic hypertrophy, BPH). The procedure is performed with a thin device called a cystoscope. This device is inserted through the tip of the penis and moved up the urethra to reach the prostate. This is less invasive than other procedures that require an incision. If you will be going home right after the procedure, plan to have a responsible adult take you home from the hospital or clinic. You will not be allowed to drive. This information is not intended to replace advice given to you by your health care provider. Make sure you discuss any questions you have with your health care provider. Document Revised: 08/17/2020 Document Reviewed: 08/17/2020 Elsevier Patient Education  2022 [place].  Benign Prostatic Hyperplasia Benign prostatic hyperplasia (BPH) is an enlarged prostate gland that is caused by the normal aging process. The prostate may get bigger as a man gets older. The condition is not caused by cancer. The prostate is a walnut-sized gland that is involved in the production of semen. It is located in front of the rectum and below the bladder. The  bladder stores urine. The urethra carries stored urine out of the body. An enlarged prostate can press on the urethra. This can make it harder to pass urine. The buildup of urine in the bladder can cause infection. Back pressure and infection may progress to bladder damage and kidney (renal) failure. What are the causes? This condition is part of the normal aging process. However, not all men develop problems from this condition. If the prostate enlarges away from the urethra, urine flow will not be blocked. If it enlarges toward the urethra and compresses it, there will be problems passing urine. What increases the risk? This condition is more likely to develop in men older than 50 years. What are the signs or symptoms? Symptoms of this condition include: Getting up often during the night to urinate. Needing to urinate frequently during the day. Difficulty starting urine flow. Decrease in size and strength of your urine stream. Leaking (dribbling) after urinating. Inability to pass urine. This needs immediate treatment. Inability to completely empty your bladder. Pain when you pass urine. This is more common if there is also an infection. Urinary tract infection (UTI). How is this diagnosed? This condition is diagnosed based on your medical history, a physical exam, and your symptoms. Tests will also be done, such as: A post-void bladder scan. This measures any amount of urine that may remain in your bladder after you finish urinating. A digital rectal exam. In a rectal  exam, your health care provider checks your prostate by putting a lubricated, gloved finger into your rectum to feel the back of your prostate gland. This exam detects the size of your gland and any abnormal lumps or growths. An exam of your urine (urinalysis). A prostate specific antigen (PSA) screening. This is a blood test used to screen for prostate cancer. An ultrasound. This test uses sound waves to electronically  produce a picture of your prostate gland. Your health care provider may refer you to a specialist in kidney and prostate diseases (urologist). How is this treated? Once symptoms begin, your health care provider will monitor your condition (active surveillance or watchful waiting). Treatment for this condition will depend on the severity of your condition. Treatment may include: Observation and yearly exams. This may be the only treatment needed if your condition and symptoms are mild. Medicines to relieve your symptoms, including: Medicines to shrink the prostate. Medicines to relax the muscle of the prostate. Surgery in severe cases. Surgery may include: Prostatectomy. In this procedure, the prostate tissue is removed completely through an open incision or with a laparoscope or robotics. Transurethral resection of the prostate (TURP). In this procedure, a tool is inserted through the opening at the tip of the penis (urethra). It is used to cut away tissue of the inner core of the prostate. The pieces are removed through the same opening of the penis. This removes the blockage. Transurethral incision (TUIP). In this procedure, small cuts are made in the prostate. This lessens the prostate's pressure on the urethra. Transurethral microwave thermotherapy (TUMT). This procedure uses microwaves to create heat. The heat destroys and removes a small amount of prostate tissue. Transurethral needle ablation (TUNA). This procedure uses radio frequencies to destroy and remove a small amount of prostate tissue. Interstitial laser coagulation ([place]). This procedure uses a laser to destroy and remove a small amount of prostate tissue. Transurethral electrovaporization (TUVP). This procedure uses electrodes to destroy and remove a small amount of prostate tissue. Prostatic urethral lift. This procedure inserts an implant to push the lobes of the prostate away from the urethra. Follow these instructions at  home: Take over-the-counter and prescription medicines only as told by your health care provider. Monitor your symptoms for any changes. Contact your health care provider with any changes. Avoid drinking large amounts of liquid before going to bed or out in public. Avoid or reduce how much caffeine or alcohol you drink. Give yourself time when you urinate. Keep all follow-up visits. This is important. Contact a health care provider if: You have unexplained back pain. Your symptoms do not get better with treatment. You develop side effects from the medicine you are taking. Your urine becomes very dark or has a bad smell. Your lower abdomen becomes distended and you have trouble passing urine. Get help right away if: You have a fever or chills. You suddenly cannot urinate. You feel light-headed or very dizzy, or you faint. There are large amounts of blood or clots in your urine. Your urinary problems become hard to manage. You develop moderate to severe low back or flank pain. The flank is the side of your body between the ribs and the hip. These symptoms may be an emergency. Get help right away. Call 911. Do not wait to see if the symptoms will go away. Do not drive yourself to the hospital. Summary Benign prostatic hyperplasia (BPH) is an enlarged prostate that is caused by the normal aging process. It is not caused  by cancer. An enlarged prostate can press on the urethra. This can make it hard to pass urine. This condition is more likely to develop in men older than 50 years. Get help right away if you suddenly cannot urinate. This information is not intended to replace advice given to you by your health care provider. Make sure you discuss any questions you have with your health care provider. Document Revised: 08/08/2020 Document Reviewed: 08/08/2020 Elsevier Patient Education  [place].

## 2021-02-14 NOTE — Progress Notes (Signed)
Cystoscopy Procedure Note:  Indication: Weak urinary stream, urgency/frequency, nocturia  After informed consent and discussion of the procedure and its risks, Alexander Barton was positioned and prepped in the standard fashion. Cystoscopy was performed with a flexible cystoscope. The urethra, bladder neck and entire bladder was visualized in a standard fashion. The prostate was moderate in size with obstructing lateral lobes. The ureteral orifices were visualized in their normal location and orientation.  Moderate bladder trabeculations throughout, no abnormalities on retroflexion  Imaging: Prostate measures 34 g from CT in 2019  Findings: Obstructive appearing prostate consistent with BPH and moderate bladder trabeculations  -----------------------------------------------------------------------------------------------------------------  Assessment and Plan: 71 year old male with long history of BPH symptoms on Flomax 0.4 mg twice daily with persistent symptoms of weak stream, urgency/frequency, and nocturia 2-4 times overnight.  PSA normal at 0.25.  He drinks mostly water during the day.  We discussed the overlap between BPH and overactive bladder, but that his symptoms and cystoscopy today are most consistent with BPH with some component of incomplete bladder emptying.  We discussed options with outlet procedures like UroLift or HOLEP.  With his small prostate and desire for smallest side effect profile from a procedure, I think UroLift is a better option.  We discussed this is a 10 to 15-minute procedure performed with sedation where small pins were placed to prop open the prostate channel and improve urinary flow and emptying.  Patient's go home the same day, and rarely would need a catheter for 1 to 2 days.  It would be common to have dysuria, urgency/frequency as the bladder just for the first 1 to 2 weeks, but by 3 to 4 weeks would expect him to be doing well with better emptying and  stronger stream.  Little to no risk of incontinence, no sexual side effects.  Patient information on UroLift provided today.  He is leaning towards pursuing UroLift, will call to schedule  Nickolas Madrid, MD 02/14/2021

## 2021-02-15 LAB — URINALYSIS, COMPLETE
Bilirubin, UA: NEGATIVE
Glucose, UA: NEGATIVE
Ketones, UA: NEGATIVE
Leukocytes,UA: NEGATIVE
Nitrite, UA: NEGATIVE
Protein,UA: NEGATIVE
RBC, UA: NEGATIVE
Specific Gravity, UA: 1.02 (ref 1.005–1.030)
Urobilinogen, Ur: 0.2 mg/dL (ref 0.2–1.0)
pH, UA: 6.5 (ref 5.0–7.5)

## 2021-02-15 LAB — MICROSCOPIC EXAMINATION
Bacteria, UA: NONE SEEN
Epithelial Cells (non renal): NONE SEEN /hpf (ref 0–10)
RBC, Urine: NONE SEEN /hpf (ref 0–2)
WBC, UA: NONE SEEN /hpf (ref 0–5)

## 2021-02-18 ENCOUNTER — Encounter: Payer: Medicare PPO | Admitting: Physical Therapy

## 2021-02-19 ENCOUNTER — Ambulatory Visit (INDEPENDENT_AMBULATORY_CARE_PROVIDER_SITE_OTHER): Payer: Medicare PPO | Admitting: Pharmacist

## 2021-02-19 ENCOUNTER — Encounter: Payer: Medicare PPO | Admitting: Physical Therapy

## 2021-02-19 DIAGNOSIS — Z7901 Long term (current) use of anticoagulants: Secondary | ICD-10-CM | POA: Diagnosis not present

## 2021-02-19 DIAGNOSIS — I359 Nonrheumatic aortic valve disorder, unspecified: Secondary | ICD-10-CM | POA: Diagnosis not present

## 2021-02-19 LAB — POCT INR: INR: 3 (ref 2.0–3.0)

## 2021-02-19 NOTE — Patient Instructions (Signed)
Description   Hold warfarin today and then continue taking 10 mg daily except 7 mg each Sunday, Tuesday and Thursday.  Repeat INR in 2 weeks. Eat greens tonight

## 2021-02-20 ENCOUNTER — Encounter: Payer: Medicare PPO | Admitting: Physical Therapy

## 2021-02-21 ENCOUNTER — Encounter: Payer: Medicare PPO | Admitting: Physical Therapy

## 2021-02-25 ENCOUNTER — Encounter: Payer: Medicare PPO | Admitting: Physical Therapy

## 2021-02-26 ENCOUNTER — Encounter: Payer: Medicare PPO | Admitting: Physical Therapy

## 2021-02-27 ENCOUNTER — Encounter: Payer: Medicare PPO | Admitting: Physical Therapy

## 2021-02-28 ENCOUNTER — Encounter: Payer: Medicare PPO | Admitting: Physical Therapy

## 2021-03-04 ENCOUNTER — Encounter: Payer: Medicare PPO | Admitting: Physical Therapy

## 2021-03-05 ENCOUNTER — Encounter: Payer: Medicare PPO | Admitting: Physical Therapy

## 2021-03-05 ENCOUNTER — Ambulatory Visit (INDEPENDENT_AMBULATORY_CARE_PROVIDER_SITE_OTHER): Payer: Medicare PPO | Admitting: Cardiovascular Disease

## 2021-03-05 DIAGNOSIS — Z5181 Encounter for therapeutic drug level monitoring: Secondary | ICD-10-CM | POA: Diagnosis not present

## 2021-03-05 LAB — POCT INR: INR: 2.8 (ref 2.0–3.0)

## 2021-03-05 NOTE — Patient Instructions (Signed)
Description   Called and spoke to pt and instructed him to START taking warfarin 7mg  daily excpet for 10mg  on Monday, Wednesday and Fridays. Recheck INR in 2 weeks. [facility] [phone]

## 2021-03-06 ENCOUNTER — Encounter: Payer: Medicare PPO | Admitting: Physical Therapy

## 2021-03-11 ENCOUNTER — Ambulatory Visit: Payer: Medicare PPO | Admitting: Adult Health

## 2021-03-11 ENCOUNTER — Encounter: Payer: Self-pay | Admitting: Adult Health

## 2021-03-11 ENCOUNTER — Other Ambulatory Visit: Payer: Self-pay

## 2021-03-11 DIAGNOSIS — F22 Delusional disorders: Secondary | ICD-10-CM | POA: Diagnosis not present

## 2021-03-11 DIAGNOSIS — G4700 Insomnia, unspecified: Secondary | ICD-10-CM | POA: Diagnosis not present

## 2021-03-11 DIAGNOSIS — F331 Major depressive disorder, recurrent, moderate: Secondary | ICD-10-CM | POA: Diagnosis not present

## 2021-03-11 IMAGING — US US ABDOMEN LIMITED
1 series · 14 of 25 positions shown · non-contrast
Comparison: May 29, 2019

CLINICAL DATA: Right upper quadrant pain.

EXAM:
ULTRASOUND ABDOMEN LIMITED RIGHT UPPER QUADRANT

[Series 1: us abdomen limited ruq (liver/gb) · 14 of 55 slices shown]
[im 1/55]
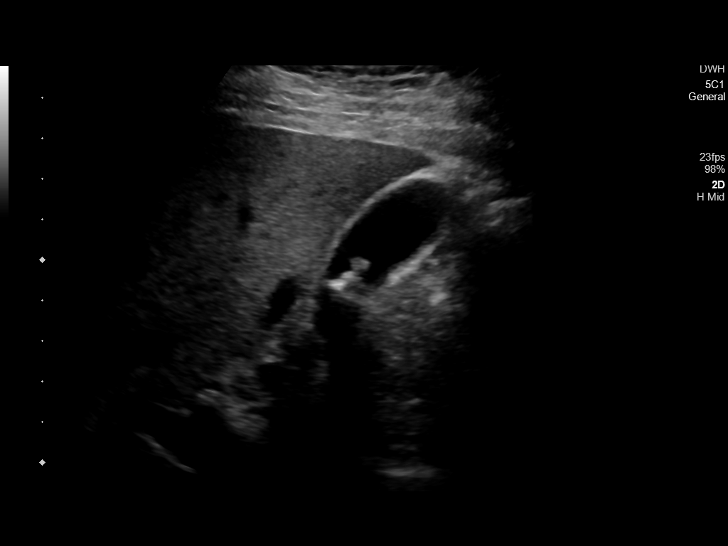
[im 5/55]
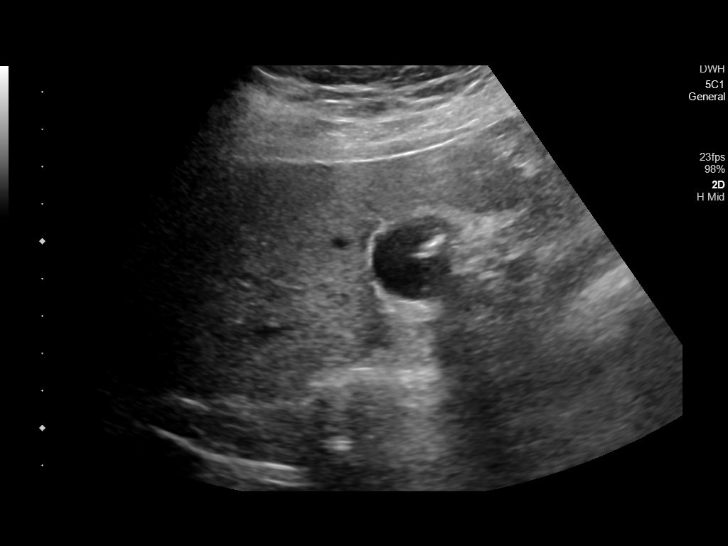
[im 10/55]
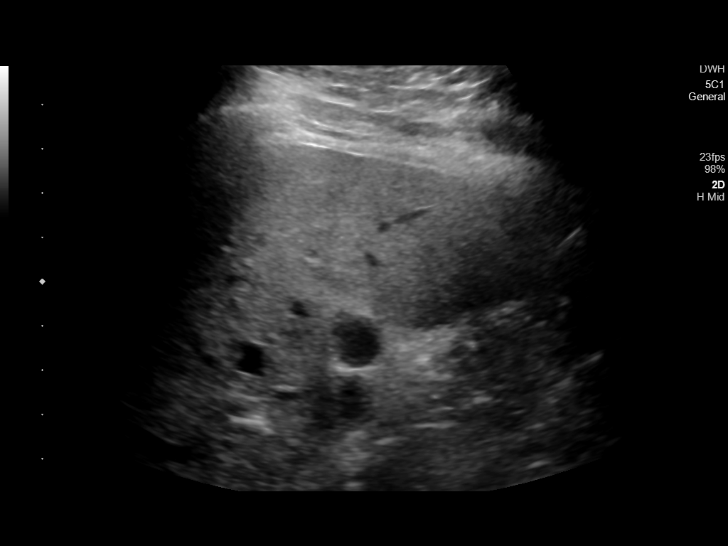
[im 14/55]
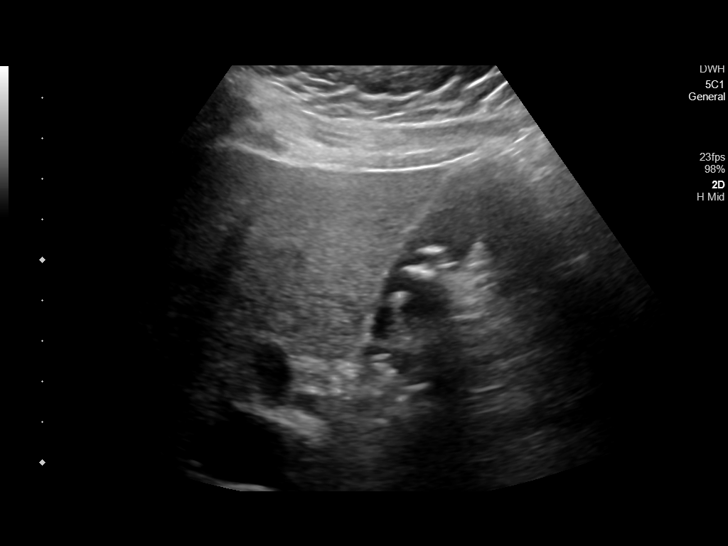
[im 19/55]
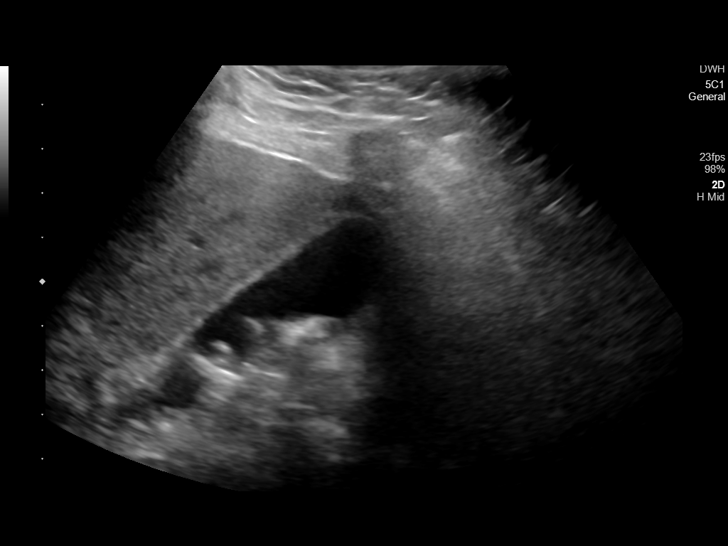
[im 21/55]
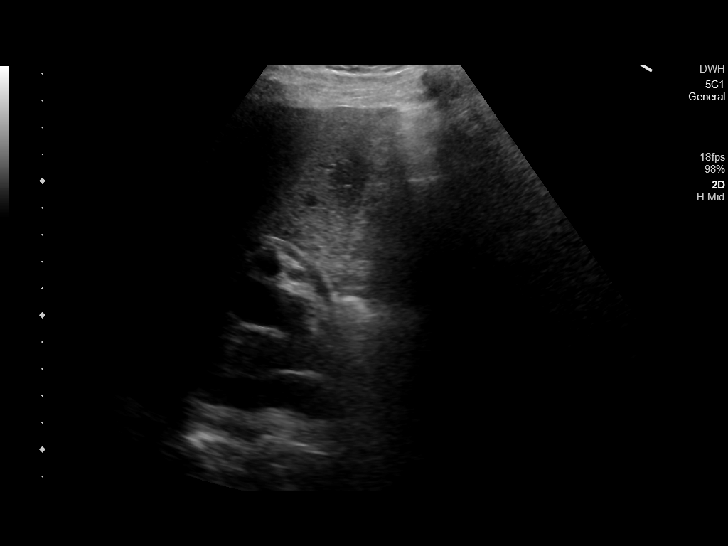
[im 25/55]
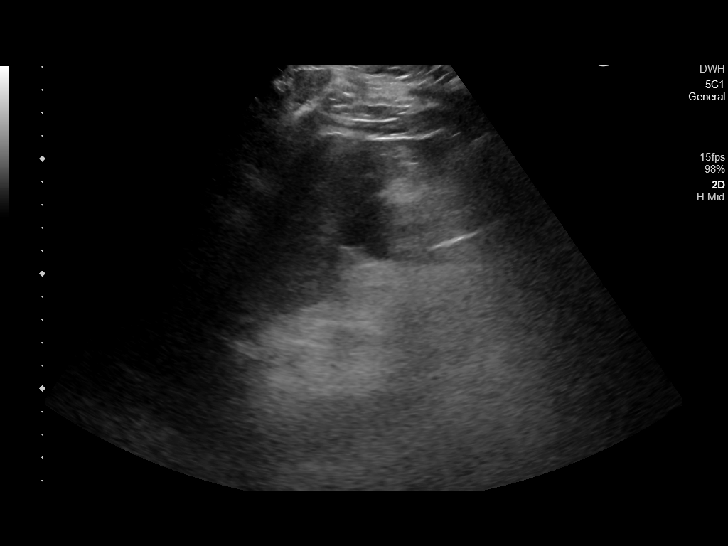
[im 30/55]
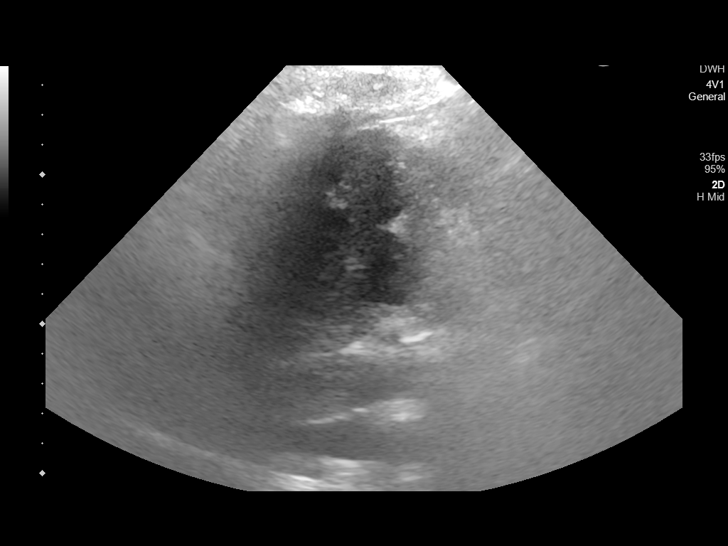
[im 34/55]
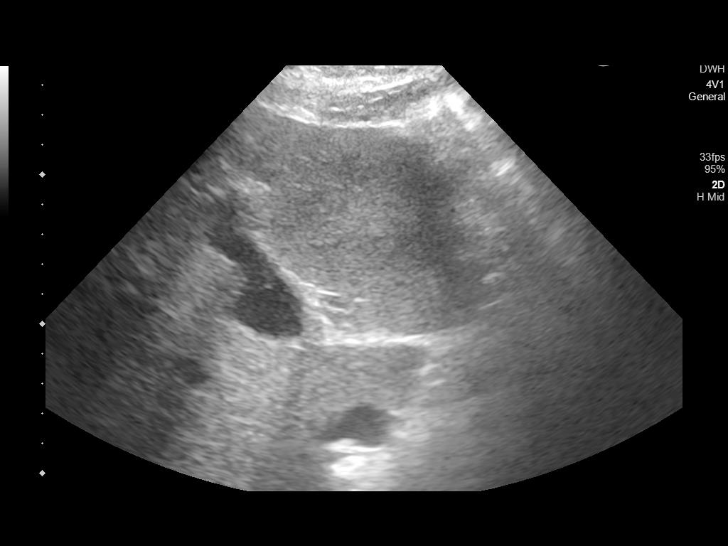
[im 37/55]
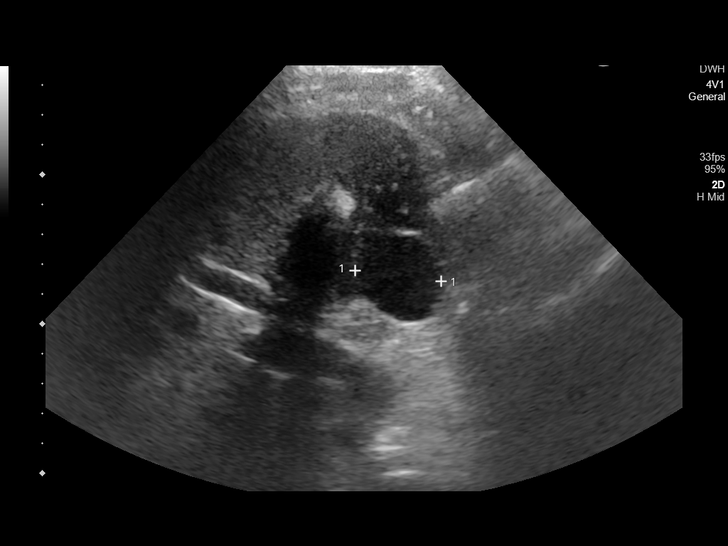
[im 41/55]
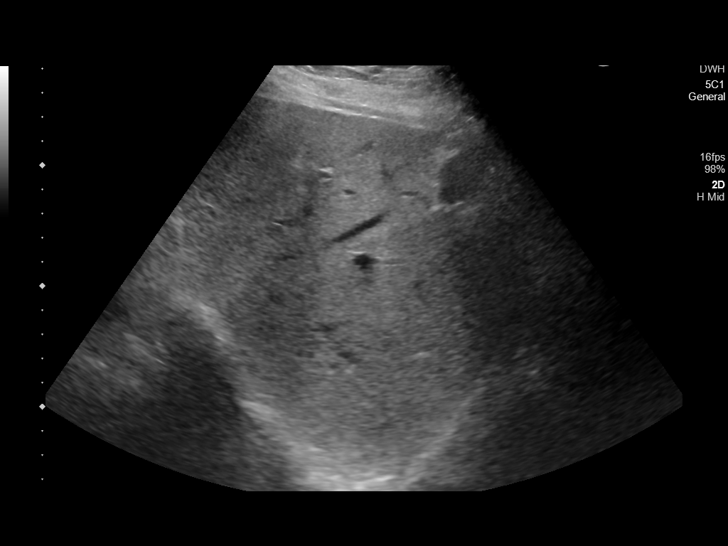
[im 46/55]
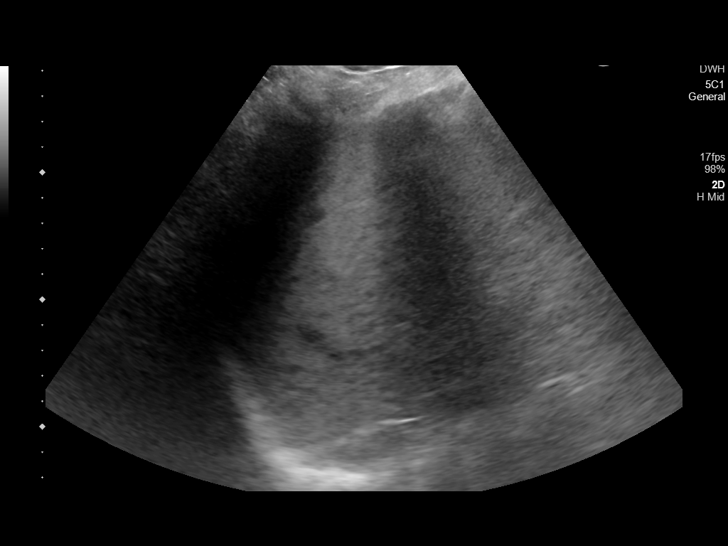
[im 50/55]
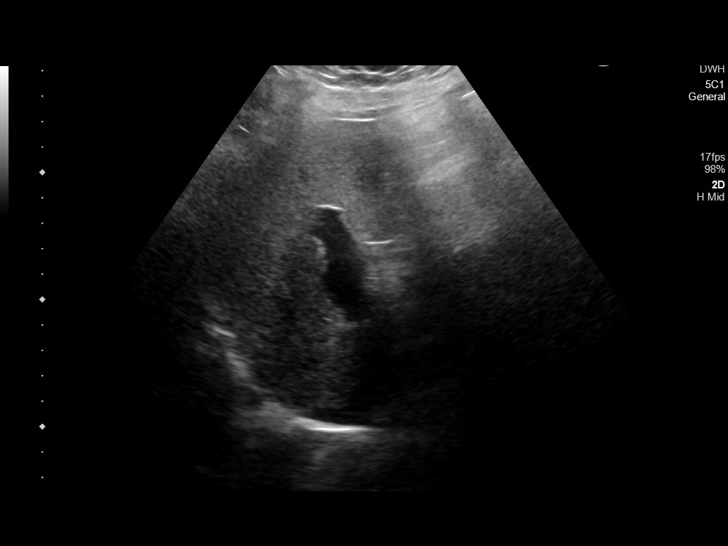
[im 55/55]
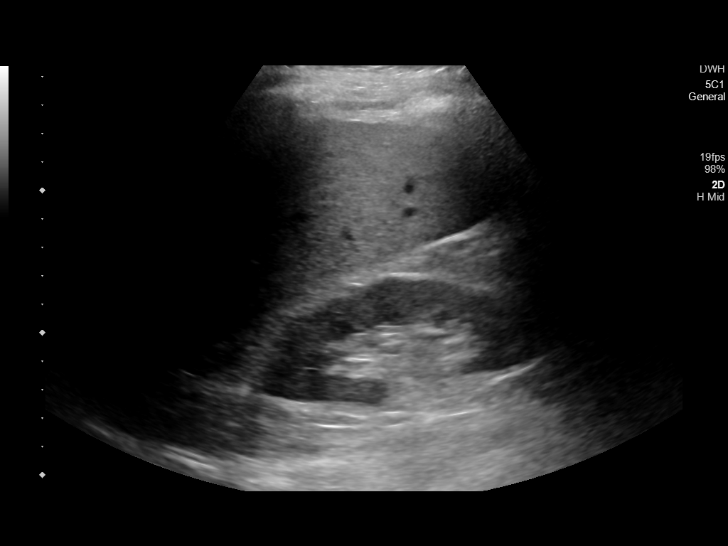

[14 of 25 positions shown; findings below may reference images not displayed]

FINDINGS: Gallbladder:

Multiple shadowing echogenic gallstones are seen within the
gallbladder lumen. There is no evidence of gallbladder wall
thickening (2.9 mm). No sonographic Murphy sign noted by
sonographer.

Common bile duct:

Diameter: 4.6 mm

Liver:

No focal lesion identified. Diffusely increased echogenicity of the
liver parenchyma is noted. Portal vein is patent on color Doppler
imaging with normal direction of blood flow towards the liver.

Other: A 2.0 cm x 2.6 cm x 2.9 cm anechoic structure is seen
adjacent to the left lobe of the liver. No flow is seen within this
region on color Doppler evaluation.
IMPRESSION: 1. Cholelithiasis, without evidence of acute cholecystitis.
2. Fatty liver.
3. Cystic appearing area seen adjacent to the left lobe of the liver
which may represent a recurring pancreatic pseudocyst. Further
evaluation with abdomen and pelvis CT is recommended.

## 2021-03-11 MED ORDER — OLANZAPINE 5 MG PO TABS: ORAL_TABLET | ORAL | 1 refills | Status: AC

## 2021-03-11 MED ORDER — BUPROPION HCL ER (XL) 150 MG PO TB24
150.0000 mg | ORAL_TABLET | Freq: Every day | ORAL | 5 refills | Status: DC
Start: 2021-03-11 — End: 2021-04-04

## 2021-03-11 NOTE — Progress Notes (Signed)
Alexander Barton [id number] [birth year] 71 y.o.  Subjective:   Patient ID:  Alexander Barton is a 71 y.o. (DOB [birth year]) male.  Chief Complaint: No chief complaint on file.   HPI Alexander Barton presents to the office today for follow-up of MDD, delusional disorder, and insomnia.  Accompanied by wife.   Describes mood today as "ok". Pleasant. Denies tearfulness. Mood symptoms - reports depression and anxiety. Would like to consider options for his depression - may be more seasonally related. Denies irritability. Stating "I'm not doing as well as I would like to be". Wife feels like he is doing much better overall. Feels like the medication continues to work well.  Energy levels marginal - not able to get outside and walk. Improved interest and motivation. Taking medications as prescribed.  Energy levels vary. Active, does not have a current exercise routine. Walking indoors.  Enjoys some usual interests and activities. Married. Lives with wife of 48 years. 2 children and 3 grandsons. Spending time with family. Appetite adequate. Weight gain - 188 pounds. Sleeps well most nights. Averages 6 - 8 hours.  Focus and concentration difficulties. Reading the bible. Completing tasks. Managing aspects of household. 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  Denies SI or HI.  Denies AH or VH.  Previous medication trials: 2008 - Seroquel - allergy. Haldol 70m at bedtime Cogentin.    Mini-Mental    Flowsheet Row Clinical Support [facility] 01/13/2018 [facility] [place] [facility] Total Score (max 30 points ) _0 PHQ2-9    FTaylorVisit from 10/10/2020 [facility] [place] [facility] 01/10/2020 [place] [place] from  10/03/2019 [place] 05/[place]/2019 in [place] PHQ-2 Total Score 4 0 0 0 0      FCypressED from 09/13/2020 in MPhillipsAdmission (Discharged) from 04/12/2020 in MFeastervilleNo Risk No Risk        Review of Systems:  Review of Systems  Musculoskeletal:  Negative for gait problem.  Neurological:  Negative for tremors.  Psychiatric/Behavioral:         Please refer to HPI   Medications: I have reviewed the patient's current medications.  Current Outpatient Medications  Medication Sig Dispense Refill   buPROPion (WELLBUTRIN XL) 150 MG 24 hr tablet Take 1 tablet (150 mg total) by mouth daily. 30 tablet 5   acetaminophen (TYLENOL) 500 MG tablet Take 1,000 mg by mouth every 6 (six) hours as needed for mild pain.     albuterol (VENTOLIN HFA) 108 (90 Base) MCG/ACT inhaler Inhale 2 puffs into the lungs every 4 (four) hours as needed for wheezing or shortness of breath.     blood glucose meter kit and supplies KIT Dispense based on patient and insurance preference. Use twice daily. (FOR ICD-10 E11.9). 1 each 0   Budeson-Glycopyrrol-Formoterol (BREZTRI AEROSPHERE) 160-9-4.8 MCG/ACT AERO Inhale 2 puffs into the lungs in the morning and at bedtime. 10.7 g 3   Cholecalciferol (VITAMIN D3) 25 MCG (1000 UT) CAPS Take by mouth.     diltiazem (CARDIZEM CD) 120 MG 24 hr capsule Take 1 capsule (120 mg total) by mouth daily. 9[place]  capsule 3   ezetimibe (ZETIA) 10 MG tablet Take 1 tablet (10 mg total) by mouth daily. 90 tablet 1   glucose blood test strip Use twice daily E11.9 100 each 1   Methylcellulose, Laxative, (CITRUCEL PO) Take by mouth daily at 2 PM.     Multiple Vitamins-Minerals (HAIR SKIN & NAILS ADVANCED PO) Take by mouth.     OLANZapine (ZYPREXA) 5 MG tablet TAKE 1 TABLET BY MOUTH EVERYDAY AT BEDTIME 90 tablet 1   polyethylene glycol (MIRALAX /  GLYCOLAX) 17 g packet Take 17 g by mouth in the morning.     tamsulosin (FLOMAX) 0.4 MG CAPS capsule TAKE 2 CAPSULES BY MOUTH EVERY DAY 180 capsule 2   vitamin B-12 (CYANOCOBALAMIN) 100 MCG tablet Take 100 mcg by mouth daily.     warfarin (COUMADIN) 2 MG tablet TAKE 1 TABLET WITH 5 MG TABLET DAILY AS DIRECTED BY COUMADIN CLINIC 90 tablet 0   warfarin (COUMADIN) 5 MG tablet TAKE 1 TABLET BY MOUTH DAILY AS DIRECTED WITH 2 MG TABLET 90 tablet 1   No current facility-administered medications for this visit.    Medication Side Effects: None  Allergies:  Allergies  Allergen Reactions   Codeine Anaphylaxis   Gadolinium Derivatives Other (See Comments)    Intense sneezing from MRI contrast   Hydrocodone Anaphylaxis   Iodinated Contrast Media Shortness Of Breath and Other (See Comments)    Uncontrollable sneezing and "shortness of breath after prep"- needs sedative the night before and hour and benadryl before using   Other Other (See Comments)    Unnamed anesthesia- Patient is in the E.D. possibly due to the effects of it: Delirium, confusion, and sleep deprivation   Statins Other (See Comments)    Muscle pain   Antihistamines, Diphenhydramine-Type Other (See Comments)    Unable to urinate. All antihistamines.    Hydromorphone Other (See Comments)    MUSCLE PAIN   Lipitor [Atorvastatin Calcium] Other (See Comments)    Muscle pain and a "knot" appears on his right side, near the ribcage   Seroquel [Quetiapine Fumerate] Swelling and Other (See Comments)    "bad trip" and tongue became swollen    Past Medical History:  Diagnosis Date   Aortic aneurysm, thoracic    Aortic valve disease    Arthritis    BPH (benign prostatic hyperplasia)    Complication of anesthesia    goes into depression   COPD (chronic obstructive pulmonary disease) (HCC)    GERD (gastroesophageal reflux disease)    HTN (hypertension)    Hypercholesterolemia    Stroke ([place])    TIA (transient ischemic attack)      Past Medical History, Surgical history, Social history, and Family history were reviewed and updated as appropriate.   Please see review of systems for further details on the patient's review from today.   Objective:   Physical Exam:  There were no vitals taken for this visit.  Physical Exam Constitutional:      General: He is not in acute distress. Musculoskeletal:        General: No deformity.  Neurological:     Mental Status: He is alert and oriented to person, place, and time.     Coordination: Coordination normal.  Psychiatric:        Attention and Perception: Attention and perception normal. He does not perceive auditory or visual hallucinations.        Mood and Affect: Mood normal. Mood is not anxious or depressed. Affect is not  labile, blunt, angry or inappropriate.        Speech: Speech normal.        Behavior: Behavior normal.        Thought Content: Thought content normal. Thought content is not paranoid or delusional. Thought content does not include homicidal or suicidal ideation. Thought content does not include homicidal or suicidal plan.        Cognition and Memory: Cognition and memory normal.        Judgment: Judgment normal.     Comments: Insight intact    Lab Review:     Component Value Date/Time   NA 139 01/09/2021 0942   NA 137 01/10/2019 1228   K 4.5 01/09/2021 0942   CL 103 01/09/2021 0942   CO2 29 01/09/2021 0942   GLUCOSE 132 (H) 01/09/2021 0942   BUN 21 01/09/2021 0942   BUN 14 01/10/2019 1228   CREATININE 1.18 01/09/2021 0942   CALCIUM 9.7 01/09/2021 0942   PROT 7.3 01/09/2021 0942   PROT 6.6 01/10/2019 1228   ALBUMIN 4.3 01/09/2021 0942   ALBUMIN 3.7 (L) 01/10/2019 1228   AST 15 01/09/2021 0942   ALT 11 01/09/2021 0942   ALKPHOS 104 01/09/2021 0942   BILITOT 0.7 01/09/2021 0942   BILITOT 0.8 01/10/2019 1228   GFRNONAA 58 (L) 09/13/2020 1638   GFRAA >60 09/22/2019 1831       Component Value Date/Time   WBC 5.7 01/09/2021 0942    RBC 4.57 01/09/2021 0942   HGB 13.9 01/09/2021 0942   HGB 9.3 (L) 01/10/2019 1228   HCT 41.2 01/09/2021 0942   HCT 28.4 (L) 01/10/2019 1228   PLT 187.0 01/09/2021 0942   PLT 305 01/10/2019 1228   MCV 90.2 01/09/2021 0942   MCV 90 01/10/2019 1228   MCH 30.9 09/13/2020 1638   MCHC 33.7 01/09/2021 0942   RDW 14.2 01/09/2021 0942   RDW 14.3 01/10/2019 1228   LYMPHSABS 1.6 10/10/2020 1053   MONOABS 0.4 10/10/2020 1053   EOSABS 0.1 10/10/2020 1053   BASOSABS 0.0 10/10/2020 1053    No results found for: POCLITH, LITHIUM   No results found for: PHENYTOIN, PHENOBARB, VALPROATE, CBMZ   .res Assessment: Plan:    Plan:  PDMP reviewed  1. Continue Zyprexa 70m  2. Add Wellbutrin XL 1558mevery morning - depression  RTC 3 months   Time spent with patient was 25 minutes. Greater than 50% of face to face time with patient was spent on counseling and coordination of care.     Patient advised to contact office with any questions, adverse effects, or acute worsening in signs and symptoms.  Discussed potential metabolic side effects associated with atypical antipsychotics, as well as potential risk for movement side effects. Advised pt to contact office if movement side effects occur.  Diagnoses and all orders for this visit:  Major depressive disorder, recurrent episode, moderate (HCC) -     buPROPion (WELLBUTRIN XL) 150 MG 24 hr tablet; Take 1 tablet (150 mg total) by mouth daily. -     OLANZapine (ZYPREXA) 5 MG tablet; TAKE 1 TABLET BY MOUTH EVERYDAY AT BEDTIME  Insomnia, unspecified type -     OLANZapine (ZYPREXA) 5 MG tablet; TAKE 1 TABLET BY MOUTH EVERYDAY AT BEDTIME  Delusional disorder ([place]    Please see After Visit Summary for patient specific instructions.  Future Appointments  Date Time Provider DeReform2/11/2021  1:30 PM ClDeberah PeltonNP [facility] [place]3/09/2021  9:30  AM Leone Haven, MD [facility]  06/25/2021 10:30 AM Malka So  R, PA [facility] None  09/09/2021 11:20 AM Sebastian Lurz, Berdie Ogren, NP [facility] None    No orders of the defined types were placed in this encounter.   -------------------------------
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
## 2021-03-15 ENCOUNTER — Encounter: Payer: Self-pay | Admitting: General Practice

## 2021-03-15 ENCOUNTER — Ambulatory Visit: Payer: Medicare PPO | Admitting: General Practice

## 2021-03-15 ENCOUNTER — Ambulatory Visit (INDEPENDENT_AMBULATORY_CARE_PROVIDER_SITE_OTHER): Payer: Medicare PPO

## 2021-03-15 ENCOUNTER — Other Ambulatory Visit: Payer: Self-pay

## 2021-03-15 VITALS — BP 120/60 | HR 84 | Ht 72.0 in | Wt 193.2 lb

## 2021-03-15 DIAGNOSIS — R002 Palpitations: Secondary | ICD-10-CM | POA: Diagnosis not present

## 2021-03-15 DIAGNOSIS — I493 Ventricular premature depolarization: Secondary | ICD-10-CM | POA: Diagnosis not present

## 2021-03-15 DIAGNOSIS — Z79899 Other long term (current) drug therapy: Secondary | ICD-10-CM

## 2021-03-15 DIAGNOSIS — I359 Nonrheumatic aortic valve disorder, unspecified: Secondary | ICD-10-CM | POA: Diagnosis not present

## 2021-03-15 DIAGNOSIS — I639 Cerebral infarction, unspecified: Secondary | ICD-10-CM | POA: Diagnosis not present

## 2021-03-15 DIAGNOSIS — R42 Dizziness and giddiness: Secondary | ICD-10-CM

## 2021-03-15 DIAGNOSIS — Z952 Presence of prosthetic heart valve: Secondary | ICD-10-CM | POA: Diagnosis not present

## 2021-03-15 DIAGNOSIS — I480 Paroxysmal atrial fibrillation: Secondary | ICD-10-CM | POA: Diagnosis not present

## 2021-03-15 DIAGNOSIS — I10 Essential (primary) hypertension: Secondary | ICD-10-CM

## 2021-03-15 NOTE — Patient Instructions (Signed)
Medication Instructions:  The current medical regimen is effective;  continue present plan and medications as directed. Please refer to the Current Medication list given to you today.   *If you need a refill on your cardiac medications before your next appointment, please call your pharmacy*  Lab Work:    CBC. BMET AND TSH TODAY  Testing/Procedures:  7 DAY ZIO MONITOR-THIS WILL BE MAILED TO YOU-SEE DIRECTIONS ATTACHED. PLACE MONITOR AFTER YOUR TESTING YOUR TESTING AT [facility].  Follow-Up: Your next appointment:  6-8 week(s) In Person with [name], MD  or [name], FNP       At [facility], you and your health needs are our priority.  As part of our continuing mission to provide you with exceptional heart care, we have created designated Provider Care Teams.  These Care Teams include your primary Cardiologist (physician) and Advanced Practice Providers (APPs -  Physician Assistants and Nurse Practitioners) who all work together to provide you with the care you need, when you need it.     ZIO XT- Long Term Monitor Instructions  Your physician has requested you wear a ZIO patch monitor for 14 days.  This is a single patch monitor. [employer] supplies one patch monitor per enrollment. Additional stickers are not available. Please do not apply patch if you will be having a Nuclear Stress Test,  Echocardiogram, Cardiac CT, MRI, or Chest Xray during the period you would be wearing the  monitor. The patch cannot be worn during these tests. You cannot remove and re-apply the  ZIO XT patch monitor.  Your ZIO patch monitor will be mailed 3 day USPS to your address on file. It may take 3-5 days  to receive your monitor after you have been enrolled.  Once you have received your monitor, please review the enclosed instructions. Your monitor  has already been registered assigning a specific monitor serial # to you.  Billing and Patient Assistance Program Information  We have supplied [employer]  with any of your insurance information on file for billing purposes. [employer] offers a sliding scale Patient Assistance Program for patients that do not have  insurance, or whose insurance does not completely cover the cost of the ZIO monitor.  You must apply for the Patient Assistance Program to qualify for this discounted rate.  To apply, please call [employer] at [phone], select option 4, select option 2, ask to apply for  Patient Assistance Program. Alexander Barton will ask your household income, and how many people  are in your household. They will quote your out-of-pocket cost based on that information.  [employer] will also be able to set up a 3-month, interest-free payment plan if needed.  Applying the monitor   Shave hair from upper left chest.  Hold abrader disc by orange tab. Rub abrader in 40 strokes over the upper left chest as  indicated in your monitor instructions.  Clean area with 4 enclosed alcohol pads. Let dry.  Apply patch as indicated in monitor instructions. Patch will be placed under collarbone on left  side of chest with arrow pointing upward.  Rub patch adhesive wings for 2 minutes. Remove white label marked "1". Remove the white  label marked "2". Rub patch adhesive wings for 2 additional minutes.  While looking in a mirror, press and release button in center of patch. A small green light will  flash 3-4 times. This will be your only indicator that the monitor has been turned on.  Do not shower for the first 24  hours. You may shower after the first 24 hours.  Press the button if you feel a symptom. You will hear a small click. Record Date, Time and  Symptom in the Patient Logbook.  When you are ready to remove the patch, follow instructions on the last 2 pages of Patient  Logbook. Stick patch monitor onto the last page of Patient Logbook.  Place Patient Logbook in the blue and white box. Use locking tab on box and tape box closed  securely. The blue and white box has  prepaid postage on it. Please place it in the mailbox as  soon as possible. Your physician should have your test results approximately 7 days after the  monitor has been mailed back to [facility].  Call [place] at [phone] if you have questions regarding  your ZIO XT patch monitor. Call them immediately if you see an orange light blinking on your  monitor.  If your monitor falls off in less than 4 days, contact our Monitor department at [phone].  If your monitor becomes loose or falls off after 4 days call [employer] at [phone] for  suggestions on securing your monitor

## 2021-03-15 NOTE — Progress Notes (Unsigned)
Enrolled for [employer] to mail a ZIO XT long term holter monitor to the patients address on file.  

## 2021-03-16 LAB — BASIC METABOLIC PANEL
BUN/Creatinine Ratio: 15 (ref 10–24)
BUN: 21 mg/dL (ref 8–27)
CO2: 22 mmol/L (ref 20–29)
Calcium: 9.7 mg/dL (ref 8.6–10.2)
Chloride: 97 mmol/L (ref 96–106)
Creatinine, Ser: 1.4 mg/dL — ABNORMAL HIGH (ref 0.76–1.27)
Glucose: 141 mg/dL — ABNORMAL HIGH (ref 70–99)
Potassium: 4.8 mmol/L (ref 3.5–5.2)
Sodium: 137 mmol/L (ref 134–144)
eGFR: 53 mL/min/{1.73_m2} — ABNORMAL LOW (ref >59)

## 2021-03-16 LAB — CBC
Hematocrit: 39.8 % (ref 37.5–51.0)
Hemoglobin: 13.9 g/dL (ref 13.0–17.7)
MCH: 30.8 pg (ref 26.6–33.0)
MCHC: 34.9 g/dL (ref 31.5–35.7)
MCV: 88 fL (ref 79–97)
Platelets: 212 10*3/uL (ref 150–450)
RBC: 4.52 x10E6/uL (ref 4.14–5.80)
RDW: 13.3 % (ref 11.6–15.4)
WBC: 6.7 10*3/uL (ref 3.4–10.8)

## 2021-03-16 LAB — TSH: TSH: 2.69 u[IU]/mL (ref 0.450–4.500)

## 2021-03-19 ENCOUNTER — Ambulatory Visit (INDEPENDENT_AMBULATORY_CARE_PROVIDER_SITE_OTHER): Payer: Medicare PPO | Admitting: Cardiology

## 2021-03-19 DIAGNOSIS — Z5181 Encounter for therapeutic drug level monitoring: Secondary | ICD-10-CM | POA: Diagnosis not present

## 2021-03-19 LAB — POCT INR: INR: 2.4 (ref 2.0–3.0)

## 2021-03-19 NOTE — Patient Instructions (Signed)
Description   Called and spoke to pt and instructed him to continue taking warfarin 7mg  daily excpet for 10mg  on Monday, Wednesday and Fridays. Recheck INR in 2 weeks. Coumadin Clinic [phone]

## 2021-03-20 DIAGNOSIS — H15102 Unspecified episcleritis, left eye: Secondary | ICD-10-CM | POA: Diagnosis not present

## 2021-03-21 DIAGNOSIS — H524 Presbyopia: Secondary | ICD-10-CM | POA: Diagnosis not present

## 2021-03-22 DIAGNOSIS — I71012 Dissection of descending thoracic aorta: Secondary | ICD-10-CM | POA: Diagnosis not present

## 2021-03-22 DIAGNOSIS — I7123 Aneurysm of the descending thoracic aorta, without rupture: Secondary | ICD-10-CM | POA: Diagnosis not present

## 2021-03-22 DIAGNOSIS — I7103 Dissection of thoracoabdominal aorta: Secondary | ICD-10-CM | POA: Diagnosis not present

## 2021-03-22 DIAGNOSIS — Z01818 Encounter for other preprocedural examination: Secondary | ICD-10-CM | POA: Diagnosis not present

## 2021-03-22 DIAGNOSIS — Z952 Presence of prosthetic heart valve: Secondary | ICD-10-CM | POA: Diagnosis not present

## 2021-03-23 DIAGNOSIS — R42 Dizziness and giddiness: Secondary | ICD-10-CM

## 2021-03-23 DIAGNOSIS — R002 Palpitations: Secondary | ICD-10-CM

## 2021-04-02 ENCOUNTER — Ambulatory Visit (INDEPENDENT_AMBULATORY_CARE_PROVIDER_SITE_OTHER): Payer: Medicare PPO | Admitting: Cardiology

## 2021-04-02 DIAGNOSIS — Z5181 Encounter for therapeutic drug level monitoring: Secondary | ICD-10-CM

## 2021-04-02 LAB — POCT INR: INR: 2.6 (ref 2.0–3.0)

## 2021-04-02 NOTE — Patient Instructions (Signed)
Description   Called and spoke to pt and instructed him to continue taking warfarin 7mg  daily excpet for 10mg  on Monday, Wednesday and Fridays. Eat an extra serving of greens today. Recheck INR in 2 weeks. Coumadin Clinic [phone]

## 2021-04-03 ENCOUNTER — Other Ambulatory Visit: Payer: Self-pay | Admitting: Adult Health

## 2021-04-03 DIAGNOSIS — F331 Major depressive disorder, recurrent, moderate: Secondary | ICD-10-CM

## 2021-04-08 DIAGNOSIS — R42 Dizziness and giddiness: Secondary | ICD-10-CM | POA: Diagnosis not present

## 2021-04-08 DIAGNOSIS — R002 Palpitations: Secondary | ICD-10-CM | POA: Diagnosis not present

## 2021-04-10 ENCOUNTER — Other Ambulatory Visit: Payer: Self-pay | Admitting: Urology

## 2021-04-10 ENCOUNTER — Encounter: Payer: Self-pay | Admitting: Family Medicine

## 2021-04-10 ENCOUNTER — Ambulatory Visit: Payer: Medicare PPO | Admitting: Family Medicine

## 2021-04-10 ENCOUNTER — Other Ambulatory Visit: Payer: Self-pay

## 2021-04-10 VITALS — BP 120/70 | HR 88 | Temp 98.6°F | Ht 72.0 in | Wt 190.4 lb

## 2021-04-10 DIAGNOSIS — I7123 Aneurysm of the descending thoracic aorta, without rupture: Secondary | ICD-10-CM

## 2021-04-10 DIAGNOSIS — I951 Orthostatic hypotension: Secondary | ICD-10-CM | POA: Diagnosis not present

## 2021-04-10 DIAGNOSIS — R351 Nocturia: Secondary | ICD-10-CM | POA: Diagnosis not present

## 2021-04-10 DIAGNOSIS — K5900 Constipation, unspecified: Secondary | ICD-10-CM

## 2021-04-10 DIAGNOSIS — I480 Paroxysmal atrial fibrillation: Secondary | ICD-10-CM | POA: Diagnosis not present

## 2021-04-10 DIAGNOSIS — E538 Deficiency of other specified B group vitamins: Secondary | ICD-10-CM

## 2021-04-10 DIAGNOSIS — N401 Enlarged prostate with lower urinary tract symptoms: Secondary | ICD-10-CM

## 2021-04-10 DIAGNOSIS — N179 Acute kidney failure, unspecified: Secondary | ICD-10-CM

## 2021-04-10 DIAGNOSIS — I71012 Dissection of descending thoracic aorta: Secondary | ICD-10-CM | POA: Diagnosis not present

## 2021-04-10 DIAGNOSIS — N138 Other obstructive and reflux uropathy: Secondary | ICD-10-CM

## 2021-04-10 LAB — BASIC METABOLIC PANEL
BUN: 21 mg/dL (ref 6–23)
CO2: 26 mEq/L (ref 19–32)
Calcium: 9.2 mg/dL (ref 8.4–10.5)
Chloride: 104 mEq/L (ref 96–112)
Creatinine, Ser: 1.3 mg/dL (ref 0.40–1.50)
GFR: 54.87 mL/min — ABNORMAL LOW (ref >60.00)
Glucose, Bld: 150 mg/dL — ABNORMAL HIGH (ref 70–99)
Potassium: 4.2 mEq/L (ref 3.5–5.1)
Sodium: 140 mEq/L (ref 135–145)

## 2021-04-10 LAB — VITAMIN B12: Vitamin B-12: 308 pg/mL (ref 211–911)

## 2021-04-10 NOTE — Assessment & Plan Note (Signed)
Recheck vitamin B12. ?

## 2021-04-10 NOTE — Patient Instructions (Signed)
Nice to see you. ?Please continue your current regimen for your constipation. ?We will get labs today. ?

## 2021-04-10 NOTE — Assessment & Plan Note (Addendum)
Recheck kidney function.  Advised increasing water intake.  Discussed dehydration could be contributing.  Discussed that his prostate issues could be contributing. ?

## 2021-04-10 NOTE — Assessment & Plan Note (Signed)
Chronic issue.  Seems to be adequately controlled with Citrucel and MiraLAX.  He will monitor.  I have asked that he try to not strain when he has a bowel movement. ?

## 2021-04-10 NOTE — Progress Notes (Signed)
?Alexander Barton, Alexander Barton ?Phone: [phone] ? ?Alexander Barton is a 71 y.o. male who presents today for f/u. ? ?Constipation: Patient reports he is having a daily bowel movement.  Notes its watery and sticky at times.  He does note he has to strain despite his bowel movements being soft in consistency.  He does feel an urgency to have a bowel movement.  He has been taking Citrucel and MiraLAX. ? ?Lightheadedness: Patient saw cardiology for this.  He had lab work which revealed an elevated creatinine.  He had an event monitor that did not reveal any abnormalities that would cause his symptoms.  He does have atrial fibrillation and notes his heart rate goes up to the 120s when he gets lightheaded.  He does have a history of orthostasis.  Patient notes he gets 60 ounces of water a day.  He does not take any Aleve or Advil. ? ?Thoracic aortic aneurysm: Patient notes this was stable on recent evaluation by his cardiothoracic surgeon.  He does report 1 episode of chest pain though he notes the surgeon advised him this was likely severe GERD.  This has not recurred.  He notes no shortness of breath. ? ?Vitamin B12 deficiency: The patient has run out of his supplement. ? ?Social History  ? ?Tobacco Use  ?Smoking Status Never  ?Smokeless Tobacco Never  ? ? ?Current Outpatient Medications on File Prior to Visit  ?Medication Sig Dispense Refill  ? acetaminophen (TYLENOL) 500 MG tablet Take 1,000 mg by mouth every 6 (six) hours as needed for mild pain.    ? albuterol (VENTOLIN HFA) 108 (90 Base) MCG/ACT inhaler Inhale 2 puffs into the lungs every 4 (four) hours as needed for wheezing or shortness of breath.    ? blood glucose meter kit and supplies KIT Dispense based on patient and insurance preference. Use twice daily. (FOR ICD-10 E11.9). 1 each 0  ? Budeson-Glycopyrrol-Formoterol (BREZTRI AEROSPHERE) 160-9-4.8 MCG/ACT AERO Inhale 2 puffs into the lungs in the morning and at bedtime. 10.7 g 3  ? Cholecalciferol (VITAMIN D3)  25 MCG (1000 UT) CAPS Take by mouth.    ? diltiazem (CARDIZEM CD) 120 MG 24 hr capsule Take 1 capsule (120 mg total) by mouth daily. 90 capsule 3  ? ezetimibe (ZETIA) 10 MG tablet Take 1 tablet (10 mg total) by mouth daily. 90 tablet 1  ? glucose blood test strip Use twice daily E11.9 100 each 1  ? Methylcellulose, Laxative, (CITRUCEL PO) Take by mouth daily at 2 PM.    ? Multiple Vitamins-Minerals (HAIR SKIN & NAILS ADVANCED PO) Take by mouth.    ? OLANZapine (ZYPREXA) 5 MG tablet TAKE 1 TABLET BY MOUTH EVERYDAY AT BEDTIME 90 tablet 1  ? polyethylene glycol (MIRALAX / GLYCOLAX) 17 g packet Take 17 g by mouth in the morning.    ? tamsulosin (FLOMAX) 0.4 MG CAPS capsule TAKE 2 CAPSULES BY MOUTH EVERY DAY 180 capsule 2  ? vitamin B-12 (CYANOCOBALAMIN) 100 MCG tablet Take 100 mcg by mouth daily.    ? warfarin (COUMADIN) 2 MG tablet TAKE 1 TABLET WITH 5 MG TABLET DAILY AS DIRECTED BY COUMADIN CLINIC 90 tablet 0  ? warfarin (COUMADIN) 5 MG tablet TAKE 1 TABLET BY MOUTH DAILY AS DIRECTED WITH 2 MG TABLET 90 tablet 1  ? ?No current facility-administered medications on file prior to visit.  ? ? ? ?ROS see history of present illness ? ?Objective ? ?Physical Exam ?Vitals:  ? 04/10/21 0938  ?BP: 120/70  ?  Pulse: 88  ?Temp: 98.6 ?F (37 ?C)  ?SpO2: 95%  ? ? ?BP Readings from Last 3 Encounters:  ?04/10/21 120/70  ?03/15/21 120/60  ?02/14/21 119/61  ? ?Wt Readings from Last 3 Encounters:  ?04/10/21 190 lb 6.4 oz (86.4 kg)  ?03/15/21 193 lb 3.2 oz (87.6 kg)  ?02/14/21 190 lb (86.2 kg)  ? ? ?Physical Exam ?Constitutional:   ?   General: He is not in acute distress. ?   Appearance: He is not diaphoretic.  ?Cardiovascular:  ?   Rate and Rhythm: Normal rate and regular rhythm.  ?   Heart sounds: Normal heart sounds.  ?Pulmonary:  ?   Effort: Pulmonary effort is normal.  ?   Breath sounds: Normal breath sounds.  ?Abdominal:  ?   General: Bowel sounds are normal. There is no distension.  ?   Palpations: Abdomen is soft.  ?   Tenderness:  There is no abdominal tenderness.  ?Skin: ?   General: Skin is warm and dry.  ?Neurological:  ?   Mental Status: He is alert.  ? ? ? ?Assessment/Plan: Please see individual problem list. ? ?Problem List Items Addressed This Visit   ? ? AKI (acute kidney injury) (HCC)  ?  Recheck kidney function.  Advised increasing water intake.  Discussed dehydration could be contributing.  Discussed that his prostate issues could be contributing. ?  ?  ? Relevant Orders  ? Basic Metabolic Panel (BMET)  ? B12 deficiency - Primary  ?  Recheck vitamin B12. ?  ?  ? Relevant Orders  ? B12  ? BPH (benign prostatic hyperplasia)  ?  Patient reports urology discussed a UroLift.  He is still contemplating this as he would have to come off of his warfarin and bridge with Lovenox. ?  ?  ? Constipation  ?  Chronic issue.  Seems to be adequately controlled with Citrucel and MiraLAX.  He will monitor.  I have asked that he try to not strain when he has a bowel movement. ?  ?  ? Dissecting aneurysm of thoracic aorta, Stanford type B  ?  Recent CT imaging is stable.  He will continue to follow-up with his surgeon.  He will monitor for any recurrent chest pain. ?  ?  ? Orthostasis  ?  The patient is likely having orthostatic issues based on his description of his lightheaded symptoms.  Discussed that he should try to increase water intake to 80 ounces daily and rise slowly from a seated position. ?  ?  ? Paroxysmal atrial fibrillation (HCC)  ?  Event monitor did not reveal any atrial fibrillation.  Discussed his lightheadedness was likely related to orthostasis.  He will continue diltiazem and his warfarin. ?  ?  ? ? ?Return in about 6 months (around 10/11/2021). ? ?This visit occurred during the SARS-CoV-2 public health emergency.  Safety protocols were in place, including screening questions prior to the visit, additional usage of staff PPE, and extensive cleaning of exam room while observing appropriate contact time as indicated for disinfecting  solutions.  ? ? ? , Alexander Barton ?[facility] [place] ? ?

## 2021-04-10 NOTE — Assessment & Plan Note (Signed)
Event monitor did not reveal any atrial fibrillation.  Discussed his lightheadedness was likely related to orthostasis.  He will continue diltiazem and his warfarin. ?

## 2021-04-10 NOTE — Assessment & Plan Note (Signed)
Patient reports urology discussed a UroLift.  He is still contemplating this as he would have to come off of his warfarin and bridge with Lovenox. ?

## 2021-04-10 NOTE — Assessment & Plan Note (Signed)
The patient is likely having orthostatic issues based on his description of his lightheaded symptoms.  Discussed that he should try to increase water intake to 80 ounces daily and rise slowly from a seated position. ?

## 2021-04-10 NOTE — Progress Notes (Signed)
Surgical Physician Order Form [facility] ? ?* Scheduling expectation : Next Available ? ?*Length of Case: 30 minutes ? ?*Clearance needed: Yes, cardiology, has artificial heart valve and likely will need Lovenox bridging ? ?*Anticoagulation Instructions: Hold all anticoagulants ? ?*Aspirin Instructions:  Hold ? ?*Post-op visit Date/Instructions: 4 weeks postop with PVR ? ?*Diagnosis: BPH w/urinary obstruction ? ?*Procedure:   UroLift ? ? ?Additional orders: N/A ? ?-Admit type: OUTpatient ? ?-Anesthesia: MAC ? ?-VTE Prophylaxis Standing Order SCD?s    ?   ?Other:  ? ?-Standing Lab Orders Per Anesthesia   ? ?Lab other: UA&Urine Culture ? ?-Standing Test orders EKG/Chest x-ray per Anesthesia      ? ?Test other:  ? ?- Medications:  Ancef 2gm IV ? ?-Other orders:  N/A ? ? ? ?  ? ? ?

## 2021-04-10 NOTE — Assessment & Plan Note (Signed)
Recent CT imaging is stable.  He will continue to follow-up with his surgeon.  He will monitor for any recurrent chest pain. ?

## 2021-04-11 ENCOUNTER — Telehealth: Payer: Self-pay | Admitting: Urgent Care

## 2021-04-11 ENCOUNTER — Encounter: Payer: Self-pay | Admitting: Urgent Care

## 2021-04-11 ENCOUNTER — Telehealth: Payer: Self-pay | Admitting: *Deleted

## 2021-04-11 NOTE — Telephone Encounter (Signed)
-----   Message from Karen Kitchens, NP sent at 04/11/2021  4:40 PM EST ----- ?Regarding: Request for pre-operative cardiac clearance ?Request for pre-operative cardiac clearance: ?? ?1. What type of surgery is being performed?  ?CYSTOSCOPY WITH INSERTION OF UROLIFT ? ?2. When is this surgery scheduled?  ?04/26/2021 ?? ?3. Type of clearance being requested (medical, pharmacy, both). ?Both  - s/p AVR. Will need enoxaparin bridging.  ?  ?4. Are there any medications that need to be held prior to surgery? ?WARFARIN ? ?5. Practice name and name of physician performing surgery?  ?Performing surgeon: Dr. Nickolas Madrid, MD ?Requesting clearance: Honor Loh, FNP-C   ?? ?6. Anesthesia type (none, local, MAC, general)? ?GENERAL ? ?7. What is the office phone and fax number?   ?Phone: [phone] ?Fax: [phone] ? ?ATTENTION: Unable to create telephone message as per your standard workflow. Directed by [facility] providers to send requests for cardiac clearance to this pool for appropriate distribution to provider covering pre-operative clearances.  ? ?Honor Loh, MSN, APRN, FNP-C, CEN ?[place]  ?Peri-operative Services Nurse Practitioner ?Phone: [phone] ?04/11/21 4:40 PM ? ?

## 2021-04-11 NOTE — Progress Notes (Signed)
?  Perioperative Services ?Pre-Admission/Anesthesia Testing ? ?  ? ?Date: 04/11/21 ? ?Name: Alexander Barton ?MRN:   [id number] ? ?Re: Request from surgery for clearance prior to scheduled procedure ? ?Patient is scheduled to undergo a CYSTOSCOPY WITH INSERTION OF UROLIFT on 04/26/2021 with Dr. Nickolas Madrid, MD. Patient has not been scheduled for his PAT appointment at this point, thus has not undergone review by PAT RN and/or APP. Received communication from primary attending surgeon's office requesting that patient be submitted for clearance from cardiology.  ? ?PROVIDER SPECIALTY FAXED TO  ? [name], [name], MD  Cardiology Routed to APP pre-operative pool for review and clearance.   ? ?Plan:  ?Clearance requested sent to appropriate provider(s) as noted above. Note will be updated to reflect communication with provider's office as it relates to clearance being provided and/or the need for office visit prior to clearance for surgery being issued.  ? ?Honor Loh, MSN, APRN, FNP-C, CEN ?[place]  ?Peri-operative Services Nurse Practitioner ?Phone: [phone] ?04/11/21 4:42 PM ? ?NOTE: This note has been prepared using [employer]. Despite my best ability to proofread, there is always the potential that unintentional transcriptional errors may still occur from this process. ? ?

## 2021-04-11 NOTE — Telephone Encounter (Signed)
Request for pre-operative cardiac clearance ?Received: Today ?Alexander Kitchens, NP  P Cv Div Preop Callback ?Request for pre-operative cardiac clearance:  ?   ?1. What type of surgery is being performed?  ?CYSTOSCOPY WITH INSERTION OF UROLIFT  ? ?2. When is this surgery scheduled?  ?04/26/2021  ?   ?3. Type of clearance being requested (medical, pharmacy, both).  ?Both  - s/p AVR. Will need enoxaparin bridging.  ?   ?4. Are there any medications that need to be held prior to surgery?  ?WARFARIN  ? ?5. Practice name and name of physician performing surgery?  ?Performing surgeon: Dr. Nickolas Madrid, MD  ?Requesting clearance: Alexander Loh, FNP-C    ?   ?6. Anesthesia type (none, local, MAC, general)?  ?GENERAL  ? ?7. What is the office phone and fax number?    ?Phone: [phone]  ?Fax: [phone]  ? ?ATTENTION: Unable to create telephone message as per your standard workflow. Directed by [facility] providers to send requests for cardiac clearance to this pool for appropriate distribution to provider covering pre-operative clearances.  ? ?Alexander Loh, MSN, APRN, FNP-C, CEN  ?[place]  ?Peri-operative Services Nurse Practitioner  ?Phone: [phone]  ?04/11/21 4:40 PM   ?

## 2021-04-12 ENCOUNTER — Telehealth: Payer: Self-pay | Admitting: *Deleted

## 2021-04-12 ENCOUNTER — Telehealth: Payer: Self-pay

## 2021-04-12 NOTE — Telephone Encounter (Signed)
Pt has been scheduled for a telephone visit on Monday, 04/15/21 @ 11:20. ? ?

## 2021-04-12 NOTE — Progress Notes (Signed)
[facility] Posting Form  ? ?Surgery Date/Time: Date: 04/26/2021 ? ?Surgeon: Dr. Nickolas Madrid, MD ? ?Surgery Location: Day Surgery ? ?Inpt ( No  )   Outpt (Yes)   Obs ( No  )  ? ?Diagnosis: Benign Prostatic Hyperplasia with Urinary Obstruction ? ?-CPT: 07615, [phone] ? ?Surgery: Cystoscopy with insertion of Urolift ? ?Stop Anticoagulations: Yes and also hold ASA ? ?Cardiac/Medical/Pulmonary Clearance needed: Yes. Patient has an artificial heart valve and will need Lovenox Bridging.  ? ?Clearance needed from Dr: Dr. [name] with [name] ? ?Clearance request sent on: Date: 04/11/2021 by Honor Loh, NP with Peri-Operative Services.  ? ?*Orders entered into EPIC  Date: 04/12/21  ? ?*Case booked in [place]  Date: 04/11/2021 ? ?*Notified pt of Surgery: Date: 04/11/2021 ? ?PRE-OP UA & CX: Yes, will obtain on 04/15/2021 ? ?*Placed into Prior Authorization Work Fabio Bering Date: 04/12/21 ? ? ?Assistant/laser/rep:No ? ? ? ? ? ? ? ? ? ? ? ? ? ? ? ?

## 2021-04-12 NOTE — Telephone Encounter (Signed)
? ? ?  Name: Alexander Barton  ?DOB: 07/15/1950  ?MRN: [id number] ? ?Primary Cardiologist: [name], MD ? ? ?Preoperative team, please contact this patient and set up a phone call appointment for further preoperative risk assessment. Please obtain consent and complete medication review. Thank you for your help. ? ? ?Ledora Bottcher, PA-C ?04/12/2021, 12:52 PM ?[phone] ?[place] ?[address] ?[place], [place] [address] ? ? ?

## 2021-04-12 NOTE — Telephone Encounter (Signed)
Pt has been scheduled for telephone call Monday 04/12/21 @ 11:20. ?Consent on file / medications reconciled. ? ?  ?Patient Consent for Virtual Visit  ? ? ?   ? ?[name] has provided verbal consent on 04/12/2021 for a virtual visit (video or telephone). ? ? ?CONSENT FOR VIRTUAL VISIT FOR:  Alexander Barton  ?By participating in this virtual visit I agree to the following: ? ?I hereby voluntarily request, consent and authorize [facility] and its employed or contracted physicians, physician assistants, nurse practitioners or other licensed health care professionals (the Practitioner), to provide me with telemedicine health care services (the ?Services") as deemed necessary by the treating Practitioner. I acknowledge and consent to receive the Services by the Practitioner via telemedicine. I understand that the telemedicine visit will involve communicating with the Practitioner through live audiovisual communication technology and the disclosure of certain medical information by electronic transmission. I acknowledge that I have been given the opportunity to request an in-person assessment or other available alternative prior to the telemedicine visit and am voluntarily participating in the telemedicine visit. ? ?I understand that I have the right to withhold or withdraw my consent to the use of telemedicine in the course of my care at any time, without affecting my right to future care or treatment, and that the Practitioner or I may terminate the telemedicine visit at any time. I understand that I have the right to inspect all information obtained and/or recorded in the course of the telemedicine visit and may receive copies of available information for a reasonable fee.  I understand that some of the potential risks of receiving the Services via telemedicine include:  ?Delay or interruption in medical evaluation due to technological equipment failure or disruption; ?Information transmitted may not be  sufficient (e.g. poor resolution of images) to allow for appropriate medical decision making by the Practitioner; and/or  ?In rare instances, security protocols could fail, causing a breach of personal health information. ? ?Furthermore, I acknowledge that it is my responsibility to provide information about my medical history, conditions and care that is complete and accurate to the best of my ability. I acknowledge that Practitioner's advice, recommendations, and/or decision may be based on factors not within their control, such as incomplete or inaccurate data provided by me or distortions of diagnostic images or specimens that may result from electronic transmissions. I understand that the practice of medicine is not an exact science and that Practitioner makes no warranties or guarantees regarding treatment outcomes. I acknowledge that a copy of this consent can be made available to me via my patient portal ([facility]), or I can request a printed copy by calling the office of [facility].   ? ?I understand that my insurance will be billed for this visit.  ? ?I have read or had this consent read to me. ?I understand the contents of this consent, which adequately explains the benefits and risks of the Services being provided via telemedicine.  ?I have been provided ample opportunity to ask questions regarding this consent and the Services and have had my questions answered to my satisfaction. ?I give my informed consent for the services to be provided through the use of telemedicine in my medical care ? ? ? ?

## 2021-04-12 NOTE — Telephone Encounter (Signed)
I spoke with Mrs. Mah and Bill. We have discussed possible surgery dates and Friday March 24th, 2023 was agreed upon by all parties. Patient given information about surgery date, what to expect pre-operatively and post operatively.  ? ?We discussed that a Pre-Admission Testing office will be calling to set up the pre-op visit that will take place prior to surgery, and that these appointments are typically done over the phone with a Pre-Admissions RN. ? ? Informed patient that our office will communicate any additional care to be provided after surgery. Patients questions or concerns were discussed during our call. Advised to call our office should there be any additional information, questions or concerns that arise. Patient verbalized understanding.  ? ?

## 2021-04-12 NOTE — Telephone Encounter (Signed)
Patient with diagnosis of On-X mechanical AVR and afib with hx of CVA on warfarin for anticoagulation.   ? ?Procedure: cystoscopy with insertion of urolift ?Date of procedure: 04/26/21 ? ?CrCl 16m/min ?Platelet count 212K ? ?Per office protocol, patient can hold warfarin for 5 days prior to procedure. Patient will need bridging with Lovenox '80mg'$  BID around procedure. He is a self-tester who is followed by the [facility] and has been bridged in the past for procedures multiple times. Will forward to [facility] antico[facility] team to help coordinate bridge for 3/24 procedure. Next INR is due on 3/14. ?

## 2021-04-15 ENCOUNTER — Ambulatory Visit (INDEPENDENT_AMBULATORY_CARE_PROVIDER_SITE_OTHER): Payer: Medicare PPO | Admitting: Physician Assistant

## 2021-04-15 ENCOUNTER — Other Ambulatory Visit: Payer: Self-pay

## 2021-04-15 ENCOUNTER — Telehealth: Payer: Self-pay | Admitting: Urgent Care

## 2021-04-15 ENCOUNTER — Other Ambulatory Visit: Payer: Medicare PPO

## 2021-04-15 DIAGNOSIS — N401 Enlarged prostate with lower urinary tract symptoms: Secondary | ICD-10-CM

## 2021-04-15 DIAGNOSIS — N138 Other obstructive and reflux uropathy: Secondary | ICD-10-CM | POA: Diagnosis not present

## 2021-04-15 DIAGNOSIS — Z01810 Encounter for preprocedural cardiovascular examination: Secondary | ICD-10-CM

## 2021-04-15 LAB — URINALYSIS, COMPLETE
Bilirubin, UA: NEGATIVE
Glucose, UA: NEGATIVE
Ketones, UA: NEGATIVE
Leukocytes,UA: NEGATIVE
Nitrite, UA: NEGATIVE
Protein,UA: NEGATIVE
RBC, UA: NEGATIVE
Specific Gravity, UA: 1.02 (ref 1.005–1.030)
Urobilinogen, Ur: 0.2 mg/dL (ref 0.2–1.0)
pH, UA: 5.5 (ref 5.0–7.5)

## 2021-04-15 LAB — MICROSCOPIC EXAMINATION
Bacteria, UA: NONE SEEN
RBC, Urine: NONE SEEN /hpf (ref 0–2)

## 2021-04-15 NOTE — Progress Notes (Signed)
Virtual Visit via Telephone Note   This visit type was conducted due to national recommendations for restrictions regarding the COVID-19 Pandemic (e.g. social distancing) in an effort to limit this patient's exposure and mitigate transmission in our community.  Due to his co-morbid illnesses, this patient is at least at moderate risk for complications without adequate follow up.  This format is felt to be most appropriate for this patient at this time.  The patient did not have access to video technology/had technical difficulties with video requiring transitioning to audio format only (telephone).  All issues noted in this document were discussed and addressed.  No physical exam could be performed with this format.  Please refer to the patient's chart for his  consent to telehealth for [facility]. Evaluation Performed:  Preoperative cardiovascular risk assessment  This visit type was conducted due to national recommendations for restrictions regarding the COVID-19 Pandemic (e.g. social distancing).  This format is felt to be most appropriate for this patient at this time.  All issues noted in this document were discussed and addressed.  No physical exam was performed (except for noted visual exam findings with Video Visits).  Please refer to the patient's chart (MyChart message for video visits and phone note for telephone visits) for the patient's consent to telehealth for [facility]. _____________   Date:  04/15/2021   Patient ID:  Nichols, Corter 09/21/1950, MRN [id number] Patient Location:  Home Provider location:   Office  Primary Care Provider:  Leone Haven, MD Primary Cardiologist:  [name], MD  Chief Complaint    71 y.o. y/o male with a h/o aortic valve disease s/p mechanical AVR, HTN, CVA 2014 and dissection of thoracic abdominal aorta, who is pending cystoscopy with UroLift, and presents today for telephonic preoperative cardiovascular risk  assessment.  Past Medical History    Past Medical History:  Diagnosis Date   Aortic aneurysm, thoracic    Aortic valve disease    Arthritis    BPH (benign prostatic hyperplasia)    Complication of anesthesia    goes into depression   COPD (chronic obstructive pulmonary disease) (HCC)    GERD (gastroesophageal reflux disease)    HTN (hypertension)    Hypercholesterolemia    Stroke (HCC)    TIA (transient ischemic attack)    Past Surgical History:  Procedure Laterality Date   ACHILLES TENDON REPAIR     AORTIC VALVE REPLACEMENT     #23 On-X valve conduit   ASCENDING AORTIC ANEURYSM REPAIR     BACK SURGERY     CHOLECYSTECTOMY N/A 04/12/2020   Procedure: LAPAROSCOPIC CHOLECYSTECTOMY;  Surgeon: Coralie Keens, MD;  Location: [facility];  Service: General;  Laterality: N/A;   FOOT SURGERY      Allergies  Allergies  Allergen Reactions   Codeine Anaphylaxis   Gadolinium Derivatives Other (See Comments)    Intense sneezing from MRI contrast   Hydrocodone Anaphylaxis   Iodinated Contrast Media Shortness Of Breath and Other (See Comments)    Uncontrollable sneezing and "shortness of breath after prep"- needs sedative the night before and hour and benadryl before using   Other Other (See Comments)    Unnamed anesthesia- Patient is in the E.D. possibly due to the effects of it: Delirium, confusion, and sleep deprivation   Statins Other (See Comments)    Muscle pain   Antihistamines, Diphenhydramine-Type Other (See Comments)    Unable to urinate. All antihistamines.    Hydromorphone Other (See Comments)  MUSCLE PAIN   Lipitor [Atorvastatin Calcium] Other (See Comments)    Muscle pain and a "knot" appears on his right side, near the ribcage   Quinolones Other (See Comments)    Fluroquinolone antibiotics should be avoided in patients with aortic disease unless alternative therapy is not an option.    Seroquel [Quetiapine Fumerate] Swelling and Other (See Comments)    "bad trip"  and tongue became swollen    History of Present Illness    SHERRON MUMMERT is a 71 y.o. male who presents via audio/video conferencing for a telehealth visit today.  Pt was last seen in cardiology clinic on 03/15/2021, by [name], NP.  At that time AYRTON MCVAY was doing well without chest pain or worsening dyspnea.  he is now pending cystoscopy with UroLift.  Since his last visit, he had a single episode of chest pain and several episode of dizziness.  Last episode of chest pain was 6 weeks ago, occurred only 1 time and felt to be acid reflux.  He has not had any exertional symptoms since.  He did mention he continued to have dizziness upon standing.  Last episode of severe dizziness was at church yesterday.   I discussed both chest discomfort and dizziness with Dr. [name].  The chest discomfort was atypical and has not recurred for the past 6 weeks.  Dizziness appears to be more orthostatic in nature.  This does not prevent him from having the surgery.  Recent echocardiogram done at [facility] on 03/22/2021 showed a normal ejection fraction 55%.  Recent heart monitor shows normal sinus rhythm with 2 episode of SVT both are quite transient.  Recent CT of chest abdomen revealed no evidence of valvular or graft complication.  Post TEVAR for chronic dissection involving aortic arch and the descending aorta, no evidence of endoleak.  Unchanged aneurysmal dilatation of the proximal descending thoracic aorta.  Given the low risk nature of the surgery, he is cleared.   Home Medications    Prior to Admission medications   Medication Sig Start Date End Date Taking? Authorizing Provider  acetaminophen (TYLENOL) 500 MG tablet Take 1,000 mg by mouth every 6 (six) hours as needed for mild pain.    [provider]  blood glucose meter kit and supplies KIT Dispense based on patient and insurance preference. Use twice daily. (FOR ICD-10 E11.9). 06/08/19   Leone Haven, MD  Cholecalciferol  (VITAMIN D3) 25 MCG (1000 UT) CAPS Take 1,000 Units by mouth daily.    [provider]  diltiazem (CARDIZEM CD) 120 MG 24 hr capsule Take 1 capsule (120 mg total) by mouth daily. 12/25/20   Fenton, Clint R, PA  ezetimibe (ZETIA) 10 MG tablet Take 1 tablet (10 mg total) by mouth daily. 10/23/20   Leone Haven, MD  glucose blood test strip Use twice daily E11.9 07/20/19   Leone Haven, MD  Methylcellulose, Laxative, (CITRUCEL PO) Take 1 Scoop by mouth daily.    [provider]  OLANZapine (ZYPREXA) 5 MG tablet TAKE 1 TABLET BY MOUTH EVERYDAY AT BEDTIME 03/11/21   Mozingo, Berdie Ogren, NP  polyethylene glycol (MIRALAX / GLYCOLAX) 17 g packet Take 17 g by mouth at bedtime.    [provider]  pravastatin (PRAVACHOL) 10 MG tablet Take 10 mg by mouth daily. 03/15/21   [provider]  tamsulosin (FLOMAX) 0.4 MG CAPS capsule TAKE 2 CAPSULES BY MOUTH EVERY DAY 02/12/21   Leone Haven, MD  vitamin B-12 (  CYANOCOBALAMIN) 100 MCG tablet Take 100 mcg by mouth daily.    [provider]  warfarin (COUMADIN) 2 MG tablet TAKE 1 TABLET WITH 5 MG TABLET DAILY AS DIRECTED BY COUMADIN CLINIC Patient taking differently: Take 2 mg by mouth See admin instructions. Take 2 mg by mouth on Tuesday, Thursday, Saturday and Sunday 12/12/19   [name], [name] M, MD  warfarin (COUMADIN) 5 MG tablet TAKE 1 TABLET BY MOUTH DAILY AS DIRECTED WITH 2 MG TABLET Patient taking differently: Take 5-10 mg by mouth See admin instructions. Take 10 mg by mouth on Monday, Wednesday and Friday; take 5 mg on Tuesday, Thursday, Saturday and Sunday 01/04/21   [name], [name] M, MD    Physical Exam    Vital Signs:  HEYWARD DOUTHIT does not have vital signs available for review today.  Given telephonic nature of communication, physical exam is limited. AAOx3. NAD. Normal affect.  Speech and respirations are unlabored.  Accessory Clinical Findings    None  Assessment & Plan    1.   Preoperative Cardiovascular Risk Assessment:  -Pending cystoscopy with UroLift.  Recent echocardiogram and CTA of chest and abdomen were stable.  He had single episode of chest pain 6 weeks ago however no recurrence since time.  He continued to have dizziness which appears to be orthostatic in nature.  Orthostatic precaution discussed with the patient.  I discussed his case with his primary cardiologist at Dr. [name], patient is cleared from the cardiac perspective to proceed with cystoscopy with UroLift.  -He will require 5 days of holding Coumadin with Lovenox bridging.  He is due for INR check tomorrow.  He is a Personal assistant.  He will call in his INR level tomorrow to discuss with our clinical pharmacist Laural Benes who will take care of the Lovenox bridging doses and instruction with the patient.  Wife says he likely will require prior authorization to lower the cost of Lovenox bridging.  I mentioned this to our clinical pharmacist as well.  COVID-19 Education: The signs and symptoms of COVID-19 were discussed with the patient and how to seek care for testing (follow up with PCP or arrange E-visit).  The importance of social distancing was discussed today.  Patient Risk:   After full review of this patient's history and clinical status, I feel that he is at least moderate risk for cardiac complications at this time, thus necessitating a telehealth visit sooner than our first available in office visit.  Time:   Today, I have spent 10 minutes with the patient with telehealth technology discussing medical history, symptoms, and management plan.     [place], [place]  04/15/2021, 12:19 PM

## 2021-04-15 NOTE — Progress Notes (Addendum)
°  Perioperative Services Pre-Admission/Anesthesia Testing     Date: 04/15/21  Name: Alexander Barton MRN:   [id number]  Re: Surgical clearance  Surgical clearance received from Dr. Cathren Laine office (cardiology). Patient has been cleared for the planned CYSTOSCOPY WITH INSERTION OF UROLIFT procedure scheduled for 04/26/2021 with Dr. Legrand Rams, MD. Cardiology notes that patient may proceed with an overall ACCEPTABLE risk stratification. Cardiology has cleared patient to hold his daily warfarin dose for 5 days prior to his procedure with plans to restart as soon as postoperatively respectively minimized as primary attending surgeon.  The patient will require enoxaparin bridging.  Cardiology pharmacist to contact patient regarding appropriate bridging instructions. Clearance from cardiology is in [facility] [place], PA-C note dated 04/15/2021)  for review by the surgical/anesthetic team on the day of his procedure.   Quentin Mulling, MSN, APRN, FNP-C, CEN [facility]  Peri-operative Services Nurse Practitioner Phone: [phone] 04/15/21 3:38 PM

## 2021-04-16 ENCOUNTER — Encounter: Payer: Self-pay | Admitting: *Deleted

## 2021-04-16 ENCOUNTER — Other Ambulatory Visit
Admission: RE | Admit: 2021-04-16 | Discharge: 2021-04-16 | Disposition: A | Discharge status: Home / Self Care | Payer: Medicare PPO | Source: Ambulatory Visit | Attending: Urology | Admitting: Urology

## 2021-04-16 ENCOUNTER — Ambulatory Visit (INDEPENDENT_AMBULATORY_CARE_PROVIDER_SITE_OTHER): Payer: Medicare PPO | Admitting: Cardiology

## 2021-04-16 VITALS — Ht 72.0 in | Wt 190.0 lb

## 2021-04-16 DIAGNOSIS — Z9889 Other specified postprocedural states: Secondary | ICD-10-CM

## 2021-04-16 DIAGNOSIS — Z7901 Long term (current) use of anticoagulants: Secondary | ICD-10-CM

## 2021-04-16 DIAGNOSIS — Z5181 Encounter for therapeutic drug level monitoring: Secondary | ICD-10-CM | POA: Diagnosis not present

## 2021-04-16 DIAGNOSIS — Q231 Congenital insufficiency of aortic valve: Secondary | ICD-10-CM

## 2021-04-16 HISTORY — DX: Depression, unspecified: F32.A

## 2021-04-16 HISTORY — DX: Anxiety disorder, unspecified: F41.9

## 2021-04-16 HISTORY — DX: Prediabetes: R73.03

## 2021-04-16 LAB — POCT INR: INR: 2.9 (ref 2.0–3.0)

## 2021-04-16 MED ORDER — ENOXAPARIN SODIUM 80 MG/0.8ML IJ SOSY: 80.0000 mg | PREFILLED_SYRINGE | Freq: Two times a day (BID) | INTRAMUSCULAR | 0 refills | Status: AC

## 2021-04-16 NOTE — Progress Notes (Signed)
3/18: Last dose of warfarin. ? ?3/19: No warfarin or enoxaparin (Lovenox). ? ?3/20: Inject enoxaparin '80mg'$  in the fatty abdominal tissue at least 2 inches from the belly button twice a day about 12 hours apart, 8am and 8pm rotate sites. No warfarin. ? ?3/21: Inject enoxaparin in the fatty tissue every 12 hours, 8am and 8pm. No warfarin. ? ?3/22: Inject enoxaparin in the fatty tissue every 12 hours, 8am and 8pm. No warfarin. ? ?3/23: Inject enoxaparin in the fatty tissue in the morning at 8 am (No PM dose). No warfarin. ? ?3/24: Procedure Day - No enoxaparin - Resume warfarin in the evening or as directed by doctor  ? ?3/25: Resume enoxaparin inject in the fatty tissue every 12 hours and take warfarin ? ?3/26: Inject enoxaparin in the fatty tissue every 12 hours and take warfarin ? ?3/27: Inject enoxaparin in the fatty tissue every 12 hours and take warfarin ? ?3/28: Inject enoxaparin in the fatty tissue every 12 hours and take warfarin ? ?3/29: Inject enoxaparin in the fatty tissue every 12 hours and take warfarin ? ?3/30: check INR ?

## 2021-04-16 NOTE — Patient Instructions (Addendum)
Description   ?Called and spoke to pt and instructed him to START taking warfarin on '7mg'$  daily except for 10 mg on Mondays and Fridays. EAT an EXTRA serving of greens today.  Please follow bridge instructions given today via My chart message. Recheck INR on 3/30. Please call coumadin clinic for any questions or concerns [phone] ? ?  ?  ?

## 2021-04-16 NOTE — Patient Instructions (Addendum)
?Your procedure is scheduled on: Friday April 26, 2021. ?Report to Day Surgery inside [facility] 2nd floor, stop by admissions desk before getting on elevator.  ?To find out your arrival time please call [phone] between 1PM - 3PM on Thursday April 25, 2021. ? ?Remember: Instructions that are not followed completely may result in serious medical risk,  ?up to and including death, or upon the discretion of your surgeon and anesthesiologist your  ?surgery may need to be rescheduled.  ? ?  _X__ 1. Do not eat food or drink fluids after midnight the night before your procedure. ?                No chewing gum or hard candies.  ? ?__X__2.  On the morning of surgery brush your teeth with toothpaste and water, you ?               may rinse your mouth with mouthwash if you wish.  Do not swallow any toothpaste or mouthwash. ?   ? _X__ 3.  No Alcohol for 24 hours before or after surgery. ? ? _X__ 4.  Do Not Smoke or use e-cigarettes For 24 Hours Prior to Your Surgery. ?                Do not use any chewable tobacco products for at least 6 hours prior to ?                Surgery. ? ?_X__  5.  Do not use any recreational drugs (marijuana, cocaine, heroin, ecstasy, MDMA or other) ?               For at least one week prior to your surgery.  Combination of these drugs with anesthesia ?               May have life threatening results. ? ?____  6.  Bring all medications with you on the day of surgery if instructed.  ? ?__X__7.  Notify your doctor if there is any change in your medical condition  ?    (cold, fever, infections). ?    ?Do not wear jewelry, make-up, hairpins, clips or nail polish. ?Do not wear lotions, powders, or perfumes. You may wear deodorant. ?Do not shave 48 hours prior to surgery. Men may shave face and neck. ?Do not bring valuables to the hospital.   ? ?[facility] is not responsible for any belongings or valuables. ? ?Contacts, dentures or bridgework may not be worn into  surgery. ?Leave your suitcase in the car. After surgery it may be brought to your room. ?For patients admitted to the hospital, discharge time is determined by your ?treatment team. ?  ?Patients discharged the day of surgery will not be allowed to drive home.   ?Make arrangements for someone to be with you for the first 24 hours of your ?Same Day Discharge. ? ? ?__X__ Take these medicines the morning of surgery with A SIP OF WATER:  ? ? 1. None  ? 2.  ? 3.  ? 4. ? 5. ? 6. ? ?____ Fleet Enema (as directed)  ? ?____ Use CHG Soap (or wipes) as directed ? ?____ Use Benzoyl Peroxide Gel as instructed ? ?____ Use inhalers on the day of surgery ? ?____ Stop metformin 2 days prior to surgery   ? ?____ Take 1/2 of usual insulin dose the night before surgery. No insulin the morning ?  of surgery.  ? ?__X__ Stop Coumadin 5 days prior to surgery as instructed by your cardiologist.  ? ?__X__ One Week prior to surgery- Stop Anti-inflammatories such as Ibuprofen, Aleve, Advil, Motrin, meloxicam (MOBIC), diclofenac, etodolac, ketorolac, Toradol, Daypro, piroxicam, Goody's or BC powders. OK TO USE TYLENOL IF NEEDED ?  ?__X__ Stop supplements until after surgery.   ? ?____ Bring C-Pap to the hospital.  ? ? ?If you have any questions regarding your pre-procedure instructions,  ?Please call Pre-admit Testing at [phone] ?

## 2021-04-18 LAB — CULTURE, URINE COMPREHENSIVE

## 2021-04-19 ENCOUNTER — Other Ambulatory Visit: Payer: Self-pay | Admitting: Family Medicine

## 2021-04-22 ENCOUNTER — Encounter: Payer: Self-pay | Admitting: Urology

## 2021-04-23 ENCOUNTER — Encounter: Payer: Self-pay | Admitting: Urology

## 2021-04-23 NOTE — Progress Notes (Signed)
?Perioperative Services ? ?Pre-Admission/Anesthesia Testing Clinical Review ? ?Date: 04/23/21 ? ?Patient Demographics:  ?Name: Alexander Barton ?DOB:   November 10, 1950 ?MRN:   [id number] ? ?Planned Surgical Procedure(s):  ? ? Case: [id number] Date/Time: 04/26/21 1300  ? Procedure: CYSTOSCOPY WITH INSERTION OF UROLIFT  ? Anesthesia type: Monitor Anesthesia Care  ? Pre-op diagnosis: Benign Prostatic Hyperplasia with Urinary Obstruction  ? Location: [facility] OR ROOM 10 / [facility] ORS FOR ANESTHESIA GROUP  ? Surgeons: Billey Co, MD  ? ?NOTE: Available PAT nursing documentation and vital signs have been reviewed. Clinical nursing staff has updated patient's PMH/PSHx, current medication list, and drug allergies/intolerances to ensure comprehensive history available to assist in medical decision making as it pertains to the aforementioned surgical procedure and anticipated anesthetic course. Extensive review of available clinical information performed. [place] PMH and PSHx updated with any diagnoses/procedures that  may have been inadvertently omitted during his intake with the pre-admission testing department's nursing staff. ? ?Clinical Discussion:  ?Alexander Barton is a 71 y.o. male who is submitted for pre-surgical anesthesia review and clearance prior to him undergoing the above procedure. Patient has never been a smoker. Pertinent PMH includes: CAD, paroxysmal atrial fibrillation, bicuspid aortic valve syndrome (s/p AVR), thoracic aortic aneurysm (s/p repair), TIA, HTN, HLD, T2DM, GERD (no daily Tx), OA, BPH, anxiety, depression. ? ?Patient is followed by cardiology ([name], MD). He was last seen in the cardiology clinic on 03/15/2021; notes reviewed.  At the time of his clinic visit, patient doing well overall from a cardiovascular perspective.  He denied any episodes of chest pain, shortness breath, PND, orthopnea, palpitations, significant peripheral edema, or presyncope/syncope.  Patient did complain of several  episodes of dizziness over the course of the preceding month.  PMH significant for cardiovascular diagnoses. ? ?Diagnostic left heart catheterization performed on 11/30/2006 revealing a mildly reduced left ventricular systolic function with an EF of 40%.  There was nonobstructive CAD noted; 10% proximal RCA, 20% proximal LAD, and 10% D1.  No intervention was performed opting for medical management. ? ?Patient with a history of an ascending thoracic aortic aneurysm and bicuspid valve syndrome.  He underwent a button Bentall procedure on 12/01/2006 placing a 23 mm On-X mechanical conduit (28 mm Vascutek Valsalva graft).  Transverse aortic hemi-arch graft (#26 Dacron graft) placed during the procedure. ? ?Patient suffered a TIA in 02/2010. ? ?Patient suffered a RIGHT thalamic infarction on 06/04/2012.  To date, patient has no residual deficits associated with prior cerebrovascular event. ? ?Repeat CT imaging of the patient's chest revealed chronic dissection (Stanford type B) of the thoracic aortic aneurysm.  He underwent staged repair of the ascending aorta, and a hemi-arch replacement via a redo sternotomy, on 12/02/2018.  Patient subsequently underwent TEVAR with interventional LEFT subclavian artery coverage, LEFT carotid-subclavian bypass, and proximal LEFT subclavian artery vascular plug on 12/09/2018. ? ?Myocardial perfusion imaging study performed on 06/25/2016 revealing a low normal left ventricular systolic function with an EF of 50%.  There was no evidence of stress-induced myocardial ischemia or arrhythmia.  Study determined to be normal and low risk. ? ?Last TTE was performed on 03/22/2021 revealing a normal left ventricular systolic function with EF >55%.  There was mild LVH.  There was trivial mitral, pulmonary, and tricuspid valve regurgitation.  Mechanical aortic valve well-seated and noted to have proper function; mean gradient 6 mmHg. ? ?Long-term cardiac event monitor study performed on 04/08/2021  revealing a predominant underlying sinus rhythm at an average heart rate of 75  bpm (range 54-190 bpm).  There were 2 episodes of SVT with the fastest lasting 5 beats at a maximum rate of 190 bpm, and the longest lasting 11 beats with an average rate of 108 bpm.  PACs were rare.  There were occasional to rare PVCs.  Ventricular bigeminy and trigeminy were present. ? ?Patient with a diagnosis of paroxysmal atrial fibrillation; CHA2DS2-VASc Score = 5 (age, HTN, CVA x 2, T2DM).  Rate and rhythm maintained on daily oral diltiazem dose.  Patient is chronically anticoagulated using warfarin; compliant with therapy with no evidence or reports of GI bleeding.  Blood pressure well controlled at 120/60 on currently prescribed CCB monotherapy.  Patient is on a statin + ezetimibe for his HLD diagnosis.  T2DM well-controlled with diet lifestyle modifications alone; last HgbA1c was 6.8% when checked on 01/09/2021.  Functional capacity somewhat limited by the vertiginous symptoms patient is experiencing, however patient still felt to be able to achieve at least 4 METS of activity without angina/anginal equivalent symptoms.  No changes were made to his medication regimen.  Patient to follow-up with outpatient cardiology in 6 to 8 weeks or sooner if needed. ? ?Alexander Barton is scheduled for an CYSTOSCOPY WITH INSERTION OF UROLIFT on 04/26/2021 with Dr. Nickolas Madrid, MD. Given patient's past medical history significant for cardiovascular diagnoses, presurgical cardiac clearance was sought by the PAT team. Per cardiology, "based ACC/AHA guidelines, the patient's past medical history, and the amount of time since his last clinic visit, this patient would be at an overall ACCEPTABLE risk for the planned procedure without further cardiovascular testing or intervention at this time".  Again, patient is on daily anticoagulation therapy.  He has been instructed on recommendations from his surgeon and cardiology for holding his  warfarin dose for 5 days prior to his procedure with plans to restart since postoperatively respectively minimized by his primary attending surgeon.  The patient is aware that his last dose of warfarin should be on 04/20/2021.  Per cardiology, patient will require enoxaparin bridging prior to his procedure.  Pharmacist from cardiology practice will contact patient to discuss directions regarding enoxaparin bridge. ? ?Patient reports previous perioperative complications with anesthesia in the past. Patient with a history of (+) post operative "transient alteration of awareness"/delirium/depression. He has experienced this on multiple lasting up to 2 weeks following general anesthesia. Of note, due to the severity of his symptoms, patient required psychiatry consult following his TEVAR in 12/2018. In review of the available records, it is noted that patient underwent a general anesthetic course at [facility] in 04/2020 without documented complications.  ? ?Vitals with BMI 04/16/2021 04/10/2021 03/15/2021  ?Height '6\' 0"'$  '6\' 0"'$  '6\' 0"'$   ?Weight 190 lbs 190 lbs 6 oz 193 lbs 3 oz  ?BMI 25.76 25.82 26.2  ?Systolic - 220 254  ?Diastolic - 70 60  ?Pulse - 88 84  ?Some encounter information is confidential and restricted. Go to Review Flowsheets activity to see all data.  ? ? ?Providers/Specialists:  ? ?NOTE: Primary physician provider listed below. Patient may have been seen by APP or partner within same practice.  ? ?PROVIDER ROLE / SPECIALTY LAST OV  ?Billey Co, MD Urology (Surgeon) 04/10/2021  ?Leone Haven, MD Primary Care Provider 04/10/2021  ?[name], [name], MD Cardiology 03/15/2021  ?Mart Piggs, MD CVTS 03/22/2021  ? ?Allergies:  ?Codeine; Gadolinium derivatives; Hydrocodone; Iodinated contrast media; Other; Statins; Antihistamines, diphenhydramine-type; Hydromorphone; Lipitor [atorvastatin calcium]; Quinolones; and Seroquel [quetiapine fumerate] ? ?Current Home  Medications:  ? ?No current  facility-administered medications for this encounter.  ? ? acetaminophen (TYLENOL) 500 MG tablet  ? Cholecalciferol (VITAMIN D3) 25 MCG (1000 UT) CAPS  ? diltiazem (CARDIZEM CD) 120 MG 24 hr capsule  ? Methylc

## 2021-04-26 ENCOUNTER — Ambulatory Visit
Admission: RE | Admit: 2021-04-26 | Discharge: 2021-04-26 | Disposition: A | Discharge status: Home / Self Care | Payer: Medicare PPO | Attending: Urology | Admitting: Urology

## 2021-04-26 ENCOUNTER — Other Ambulatory Visit: Payer: Self-pay

## 2021-04-26 ENCOUNTER — Ambulatory Visit: Payer: Medicare PPO | Admitting: Urgent Care

## 2021-04-26 ENCOUNTER — Encounter: Payer: Self-pay | Admitting: Urology

## 2021-04-26 ENCOUNTER — Encounter
Admission: RE | Disposition: A | Discharge status: Home / Self Care | Payer: Self-pay | Source: Home / Self Care | Attending: Urology

## 2021-04-26 DIAGNOSIS — I10 Essential (primary) hypertension: Secondary | ICD-10-CM | POA: Insufficient documentation

## 2021-04-26 DIAGNOSIS — E1151 Type 2 diabetes mellitus with diabetic peripheral angiopathy without gangrene: Secondary | ICD-10-CM | POA: Diagnosis not present

## 2021-04-26 DIAGNOSIS — N401 Enlarged prostate with lower urinary tract symptoms: Secondary | ICD-10-CM | POA: Diagnosis not present

## 2021-04-26 DIAGNOSIS — N138 Other obstructive and reflux uropathy: Secondary | ICD-10-CM | POA: Insufficient documentation

## 2021-04-26 DIAGNOSIS — J449 Chronic obstructive pulmonary disease, unspecified: Secondary | ICD-10-CM | POA: Insufficient documentation

## 2021-04-26 DIAGNOSIS — I2510 Atherosclerotic heart disease of native coronary artery without angina pectoris: Secondary | ICD-10-CM | POA: Insufficient documentation

## 2021-04-26 DIAGNOSIS — Z8673 Personal history of transient ischemic attack (TIA), and cerebral infarction without residual deficits: Secondary | ICD-10-CM | POA: Diagnosis not present

## 2021-04-26 DIAGNOSIS — K219 Gastro-esophageal reflux disease without esophagitis: Secondary | ICD-10-CM | POA: Diagnosis not present

## 2021-04-26 DIAGNOSIS — N400 Enlarged prostate without lower urinary tract symptoms: Secondary | ICD-10-CM | POA: Diagnosis not present

## 2021-04-26 DIAGNOSIS — N3289 Other specified disorders of bladder: Secondary | ICD-10-CM | POA: Diagnosis not present

## 2021-04-26 DIAGNOSIS — I480 Paroxysmal atrial fibrillation: Secondary | ICD-10-CM | POA: Insufficient documentation

## 2021-04-26 DIAGNOSIS — Z7901 Long term (current) use of anticoagulants: Secondary | ICD-10-CM

## 2021-04-26 HISTORY — PX: CYSTOSCOPY WITH INSERTION OF UROLIFT: SHX6678

## 2021-04-26 HISTORY — DX: Type 2 diabetes mellitus without complications: E11.9

## 2021-04-26 HISTORY — DX: Atherosclerotic heart disease of native coronary artery without angina pectoris: I25.10

## 2021-04-26 HISTORY — DX: Paroxysmal atrial fibrillation: I48.0

## 2021-04-26 HISTORY — DX: Personal history of other diseases of the circulatory system: Z86.79

## 2021-04-26 HISTORY — DX: Bicuspid aortic valve: Q23.81

## 2021-04-26 HISTORY — DX: Long term (current) use of anticoagulants: Z79.01

## 2021-04-26 HISTORY — DX: Congenital insufficiency of aortic valve: Q23.1

## 2021-04-26 HISTORY — DX: Biliary acute pancreatitis without necrosis or infection: K85.10

## 2021-04-26 LAB — PROTIME-INR
INR: 1.1 (ref 0.8–1.2)
Prothrombin Time: 13.9 seconds (ref 11.4–15.2)

## 2021-04-26 LAB — GLUCOSE, CAPILLARY: Glucose-Capillary: 160 mg/dL — ABNORMAL HIGH (ref 70–99)

## 2021-04-26 SURGERY — CYSTOSCOPY WITH INSERTION OF UROLIFT
Anesthesia: Monitor Anesthesia Care

## 2021-04-26 MED ORDER — FENTANYL CITRATE (PF) 100 MCG/2ML IJ SOLN: 25.0000 ug | INTRAMUSCULAR | Status: DC | PRN

## 2021-04-26 MED ORDER — CHLORHEXIDINE GLUCONATE 0.12 % MT SOLN: 15.0000 mL | Freq: Once | OROMUCOSAL | Status: AC

## 2021-04-26 MED ORDER — LIDOCAINE HCL (CARDIAC) PF 100 MG/5ML IV SOSY
PREFILLED_SYRINGE | INTRAVENOUS | Status: DC | PRN
  Administered 2021-04-26: 50 mg via INTRAVENOUS

## 2021-04-26 MED ORDER — PROPOFOL 10 MG/ML IV BOLUS
INTRAVENOUS | Status: DC | PRN
  Administered 2021-04-26: 75 ug/kg/min via INTRAVENOUS

## 2021-04-26 MED ORDER — FAMOTIDINE 20 MG PO TABS
ORAL_TABLET | ORAL | Status: AC
  Administered 2021-04-26: 20 mg via ORAL
  Filled 2021-04-26: qty 1

## 2021-04-26 MED ORDER — CHLORHEXIDINE GLUCONATE 0.12 % MT SOLN
OROMUCOSAL | Status: AC
  Administered 2021-04-26: 15 mL via OROMUCOSAL
  Filled 2021-04-26: qty 15

## 2021-04-26 MED ORDER — ORAL CARE MOUTH RINSE: 15.0000 mL | Freq: Once | OROMUCOSAL | Status: AC

## 2021-04-26 MED ORDER — LACTATED RINGERS IV SOLN: INTRAVENOUS | Status: DC

## 2021-04-26 MED ORDER — ONDANSETRON HCL 4 MG/2ML IJ SOLN: 4.0000 mg | Freq: Once | INTRAMUSCULAR | Status: DC | PRN

## 2021-04-26 MED ORDER — CEFAZOLIN SODIUM-DEXTROSE 2-4 GM/100ML-% IV SOLN
2.0000 g | INTRAVENOUS | Status: AC
  Administered 2021-04-26: 2 g via INTRAVENOUS

## 2021-04-26 MED ORDER — LIDOCAINE HCL (PF) 2 % IJ SOLN
INTRAMUSCULAR | Status: AC
  Filled 2021-04-26: qty 5

## 2021-04-26 MED ORDER — PROPOFOL 10 MG/ML IV BOLUS
INTRAVENOUS | Status: AC
  Filled 2021-04-26: qty 20

## 2021-04-26 MED ORDER — PHENYLEPHRINE HCL (PRESSORS) 10 MG/ML IV SOLN
INTRAVENOUS | Status: AC
  Filled 2021-04-26: qty 1

## 2021-04-26 MED ORDER — CEFAZOLIN SODIUM-DEXTROSE 2-4 GM/100ML-% IV SOLN
INTRAVENOUS | Status: AC
  Filled 2021-04-26: qty 100

## 2021-04-26 MED ORDER — FAMOTIDINE 20 MG PO TABS: 20.0000 mg | ORAL_TABLET | Freq: Once | ORAL | Status: AC

## 2021-04-26 MED ORDER — STERILE WATER FOR IRRIGATION IR SOLN
Status: DC | PRN
  Administered 2021-04-26: 3000 mL

## 2021-04-26 SURGICAL SUPPLY — 19 items
BAG DRAIN CYSTO-URO LG1000N (MISCELLANEOUS) ×2 IMPLANT
BRUSH SCRUB EZ  4% CHG (MISCELLANEOUS) ×2
BRUSH SCRUB EZ 4% CHG (MISCELLANEOUS) ×1 IMPLANT
GAUZE 4X4 16PLY ~~LOC~~+RFID DBL (SPONGE) ×4 IMPLANT
GLOVE SURG UNDER POLY LF SZ7.5 (GLOVE) ×2 IMPLANT
GOWN STRL REUS W/ TWL LRG LVL3 (GOWN DISPOSABLE) ×1 IMPLANT
GOWN STRL REUS W/ TWL XL LVL3 (GOWN DISPOSABLE) ×1 IMPLANT
GOWN STRL REUS W/TWL LRG LVL3 (GOWN DISPOSABLE) ×2
GOWN STRL REUS W/TWL XL LVL3 (GOWN DISPOSABLE) ×2
KIT TURNOVER CYSTO (KITS) ×2 IMPLANT
MANIFOLD NEPTUNE II (INSTRUMENTS) ×1 IMPLANT
PACK CYSTO AR (MISCELLANEOUS) ×2 IMPLANT
SET CYSTO W/LG BORE CLAMP LF (SET/KITS/TRAYS/PACK) ×2 IMPLANT
SURGILUBE 2OZ TUBE FLIPTOP (MISCELLANEOUS) IMPLANT
SYSTEM UROLIFT 2 CART W/ HNDL (Male Continence) ×2 IMPLANT
SYSTEM UROLIFT 2 CARTRIDGE (Male Continence) ×3 IMPLANT
WATER STERILE IRR 1000ML POUR (IV SOLUTION) ×1 IMPLANT
WATER STERILE IRR 3000ML UROMA (IV SOLUTION) ×2 IMPLANT
WATER STERILE IRR 500ML POUR (IV SOLUTION) ×2 IMPLANT

## 2021-04-26 NOTE — Anesthesia Preprocedure Evaluation (Signed)
Anesthesia Evaluation  ?Patient identified by MRN, date of birth, ID band ?Patient awake ? ? ? ?Reviewed: ?Allergy & Precautions, NPO status , Patient's Chart, lab work & pertinent test results ? ?History of Anesthesia Complications ?(+) PROLONGED EMERGENCE, Emergence Delirium and history of anesthetic complications ? ?Airway ?Mallampati: I ? ? ?Neck ROM: Full ? ? ? Dental ? ?(+) Teeth Intact, Dental Advisory Given ?  ?Pulmonary ?neg shortness of breath, asthma , COPD,  COPD inhaler, neg recent URI,  ?04/09/2020 SARS coronavirus NEG ?  ?breath sounds clear to auscultation ? ? ? ? ? ? Cardiovascular ?hypertension, (-) angina+ CAD and + Peripheral Vascular Disease (s/p Asc aorta and arch replacement, s/p TEVAR)  ?(-) Past MI and (-) Cardiac Stents (-) dysrhythmias + Valvular Problems/Murmurs (S/p AVR)  ?Rhythm:Regular Rate:Normal ?+ Systolic Click ?'18 ECHo: EF 01-65%, Grade 1 DD, mechanical AVR with root and asc aorta replacement, mild MR ?  ?Neuro/Psych ?PSYCHIATRIC DISORDERS Anxiety Depression TIA  ? GI/Hepatic ?Neg liver ROS, GERD  Controlled,  ?Endo/Other  ?diabetes, Well Controlled ? Renal/GU ?negative Renal ROS  ? ?  ?Musculoskeletal ? ?(+) Arthritis ,  ? Abdominal ?  ?Peds ? Hematology ?coumadin   ?Anesthesia Other Findings ?Past Medical History: ?No date: Anxiety ?No date: Arthritis ?No date: Bicuspid aortic valve ?    Comment:  a.) s/p button Bentall procedure 12/01/2006; 23 mm On-X  ?             mechanical valve conduit (28 mm Vascutek Valsalva graft) ?No date: BPH (benign prostatic hyperplasia) ?No date: CAD (coronary artery disease) ?    Comment:  a.) LHC 11/30/2006: non-obstructive CAD with EF 40%; 10% ?             pRCA, 20% pLAD, 10% D1; no intervention. ?No date: Complication of anesthesia ?    Comment:  a.) post operative "transient alteration of  ?             awareness"/delirium/depression; multiple occassions  ?             lasting up to 2 weeks; required  psychiatry consult  ?             following TEVAR in 12/2018. ?No date: COPD (chronic obstructive pulmonary disease) ([place]) ?No date: Depression ?No date: Diet-controlled type 2 diabetes mellitus ([place]) ?No date: Gallstone pancreatitis ?No date: GERD (gastroesophageal reflux disease) ?11/08/2018: History of 2019 novel coronavirus disease (COVID-19) ?No date: History of repair of dissecting thoracic aneurysm ?    Comment:  a.) s/p button Bentall procedure; transverse aortic  ?             hemi-arch graft (#26 Dacron graft). b.) chronic  ?             dissection (Stanford Type B); s/p ascending aorta and  ?             hemi-arch replacement via redo sternotomy on 12/02/2018.  ?             c.) s/p TEVAR with intentional LEFT subclavian artery  ?             coverage, LEFT carotid-subclavian bypass, and proximal  ?             LEFT subclavian artery vascular plug on 12/09/2018 ?No date: HTN (hypertension) ?No date: Hypercholesterolemia ?No date: Long term current use of anticoagulant ?    Comment:  a.) warfarin ?No date: PAF (paroxysmal atrial fibrillation) ([place]) ?  Comment:  a.) CHA2DS2-VASc = 5 (age, HTN, CVA x 2, T2DM). b.)  ?             rate/rhythm controlled with oral diltiazem; chronically  ?             anticoagulated using warfarin. ?06/04/2012: Right thalamic infarction [facility]) ?02/2010: TIA (transient ischemic attack) ? ? Reproductive/Obstetrics ? ?  ? ? ? ? ? ? ? ? ? ? ? ? ? ?  ?  ? ? ? ? ? ? ? ? ?Anesthesia Physical ? ?Anesthesia Plan ? ?ASA: 3 ? ?Anesthesia Plan: General  ? ?Post-op Pain Management:   ? ?Induction: Intravenous ? ?PONV Risk Score and Plan: 2 and Ondansetron, Dexamethasone, Propofol infusion and TIVA ? ?Airway Management Planned: Oral ETT and LMA ? ?Additional Equipment: None ? ?Intra-op Plan:  ? ?Post-operative Plan: Extubation in OR ? ?Informed Consent: I have reviewed the patients History and Physical, chart, labs and discussed the procedure including the risks, benefits and alternatives  for the proposed anesthesia with the patient or authorized representative who has indicated his/her understanding and acceptance.  ? ? ? ?Dental advisory given ? ?Plan Discussed with: CRNA and Surgeon ? ?Anesthesia Plan Comments: (PAT note by Karoline Caldwell, PA-C: ?Follows with cardiologist Dr. [name] for history of valvular disease status post mechanical aortic valve replacement with Bentall procedure 2008. On anticoagulation with Coumadin. Echo in July 2020 showed normal valve function.  Last seen 02/29/2020 for preop evaluation.  Per note, "Hemorrhagic pancreatic pseudocysts. Clinically improved. S/p multiple endoscopic procedures. Now being considered for lap choly. He is cleared from a cardiac standpoint. Given history of CVA he should have bridging Lovenox. Pharmacy will manage." ? ?Patient reported ?Lovenox: Last dose 04/11/20 at 2100 ?Coumadin: Last dose 04/06/20 ? ?History of type B Aortic dissection. Diagnosed in September 2019 with subsequent aneurysmal formation. Followed by Dr. Mart Piggs at [facility].  S/p  total arch replacement and TEVAR procedure on December 02, 2018. Follow up CT in Feb 2021 was satisfactory. Recommended ongoing aortic surveillance with CTA chest, abdomen, & pelvis and echocardiogram. in 1 year. ? ?History of mild chronic obstructive asthma, followed by pulmonology, maintained on Breztri. ? ?Will need DOS labs and eval.  ? ?EKG 09/26/2019: Sinus rhythm with Premature atrial complexes.  Rate 83. ? ?CTA 03/16/20 (care everywhere): ?Impression:  ? ?1. Postsurgical changes status post aortic valve replacement, ascending  ?aortic replacement, great vessel debranching and reimplantation, and  ?endovascular repair of the dissected aortic arch and descending thoracic  ?aorta. Stable aneurysmal dilatation of the proximal descending thoracic  ?aorta measuring up to 5.9 cm. Previously seen contrast opacification within  ?the false lumen within the descending thoracic aorta is no longer present.  ? ?2.  Stable appearance of extent of the thoracoabdominal dissection.  ? ?TEE 08/27/18 (care everywhere): ?INTERPRETATION ---------------------------------------------------------------  ???NORMAL LEFT VENTRICULAR SYSTOLIC FUNCTION WITH MILD LVH  ???NORMAL LA PRESSURES WITH DIASTOLIC DYSFUNCTION  ???NORMAL RIGHT VENTRICULAR SYSTOLIC FUNCTION  ???VALVULAR REGURGITATION: TRIVIAL MR, TRIVIAL PR, MILD TR  ???PROSTHETIC VALVE(S): MECH PROSTHETIC AoV  ???INSUFFICIENT TR TO ESTIMATE RVSP.  ? ???Compared with prior Echo study on 11/29/2010: SLIGHT INCREASE IN RV SIZE.  ???ANUERYSMAL BOUNCE OF IAS NOTED.  ? ?)  ? ? ? ? ? ? ?Anesthesia Quick Evaluation ? ?

## 2021-04-26 NOTE — Transfer of Care (Signed)
Immediate Anesthesia Transfer of Care Note ? ?Patient: Alexander Barton ? ?Procedure(s) Performed: CYSTOSCOPY WITH INSERTION OF UROLIFT ? ?Patient Location: PACU ? ?Anesthesia Type:MAC ? ?Level of Consciousness: drowsy ? ?Airway & Oxygen Therapy: Patient Spontanous Breathing and Patient connected to face mask oxygen ? ?Post-op Assessment: Report given to RN and Post -op Vital signs reviewed and stable ? ?Post vital signs: Reviewed and stable ? ?Last Vitals:  ?Vitals Value Taken Time  ?BP 92/54 04/26/21 0756  ?Temp 35.9   ?Pulse 74 04/26/21 0759  ?Resp 15 04/26/21 0759  ?SpO2 98 % 04/26/21 0759  ?Vitals shown include unvalidated device data. ? ?Last Pain:  ?Vitals:  ? 04/26/21 0632  ?TempSrc: Temporal  ?PainSc: 0-No pain  ?   ? ?  ? ?Complications: No notable events documented. ?

## 2021-04-26 NOTE — Op Note (Signed)
Date of procedure: 04/26/21 ?  ?Preoperative diagnosis:  ?BPH with obstruction ?  ?Postoperative diagnosis:  ?Same ?  ?Procedure: ?UroLift (4 implants placed) ?  ?Surgeon: Nickolas Madrid, MD ?  ?Anesthesia: General ?  ?Complications: None ?  ?Intraoperative findings:  ?Short prostate with obstructing lateral lobes, high bladder neck, mild trabeculations, no suspicious bladder lesions ?Uncomplicated UroLift, open channel at conclusion, excellent hemostasis ?  ?EBL: Minimal ?  ?Specimens: None ?  ?Drains: None ?  ?Indication: Alexander Barton is a 71 year old patient with BPH and obstructive symptoms refractory to maximal medical therapy who opted for intervention with UroLift.  After reviewing the management options for treatment, they elected to proceed with the above surgical procedure(s). We have discussed the potential benefits and risks of the procedure, side effects of the proposed treatment, the likelihood of the patient achieving the goals of the procedure, and any potential problems that might occur during the procedure or recuperation. Informed consent has been obtained. ?  ?Description of procedure: ?  ?The patient was taken to the operating room and MAC was induced. SCDs were placed for DVT prophylaxis. The patient was placed in the dorsal lithotomy position, prepped and draped in the usual sterile fashion, and preoperative antibiotics(Ancef) were administered. A preoperative time-out was performed.  ?  ?The 20 [place] UroLift sheath with the visual obturator was used to intubate the meatus and normal-appearing urethra was followed proximally into the bladder.  The prostate was short with obstructing lateral lobes and a high bladder neck.  Thorough cystoscopy showed mild to moderate trabeculations, but no suspicious lesions.  I started on the right side by placing a UroLift implant at the apex 1 cm distal to the bladder neck.  An identical implant was placed on the patient's left side.  I then moved back to  the verumontanum, and an additional implant was placed on the patient's right side just proximal to the Veru, and an identical procedure was performed on the left side.  At this point I re-examined the channel with the visual obturator and there was an excellent anterior channel and no significant bleeding was noted. ?  ?The bladder was left partially distended and patient will need to void prior to discharge. ?  ?Disposition: Stable to PACU ?  ?Plan: ?Must void prior to discharge ?Follow-up in clinic in 4 weeks with PVR ? ?Nickolas Madrid, MD ?04/26/2021 ? ?

## 2021-04-26 NOTE — Progress Notes (Signed)
?   04/26/21 0700  ?Clinical Encounter Type  ?Visited With Patient and family together  ?Visit Type Initial;Pre-op  ?Spiritual Encounters  ?Spiritual Needs Prayer  ? ?Chaplain provided support for patient and visitor through meaningful compassionate presence and prayer. ?

## 2021-04-26 NOTE — Discharge Instructions (Signed)
AMBULATORY SURGERY  ?DISCHARGE INSTRUCTIONS ? ? ?The drugs that you were given will stay in your system until tomorrow so for the next 24 hours you should not: ? ?Drive an automobile ?Make any legal decisions ?Drink any alcoholic beverage ? ? ?You may resume regular meals tomorrow.  Today it is better to start with liquids and gradually work up to solid foods. ? ?You may eat anything you prefer, but it is better to start with liquids, then soup and crackers, and gradually work up to solid foods. ? ? ?Please notify your doctor immediately if you have any unusual bleeding, trouble breathing, redness and pain at the surgery site, drainage, fever, or pain not relieved by medication. ? ? ? ?Additional Instructions: ? ? ? ?Please contact your physician with any problems or Same Day Surgery at [phone], Monday through Friday 6 am to 4 pm, or [place] [facility] [place] Main number at [phone].  ?

## 2021-04-26 NOTE — H&P (Signed)
? ?04/26/21 ?6:59 AM  ? ?Alexander Barton ?06/29/1950 ?[id number] ? ?CC: BPH ? ?HPI: ?71 year old male with long history of BPH symptoms on Flomax 0.4 mg twice daily with persistent symptoms of weak stream, urgency/frequency, and nocturia 2-4 times overnight.  PSA normal at 0.25.  He drinks mostly water during the day. Cysto showed obstructive prostate, and he ultimately opted to schedule urolift. Bridging with lovenox for artifical valve. ?  ? ?PMH: ?Past Medical History:  ?Diagnosis Date  ? Anxiety   ? Arthritis   ? Bicuspid aortic valve   ? a.) s/p button Bentall procedure 12/01/2006; 23 mm On-X mechanical valve conduit (28 mm Vascutek Valsalva graft)  ? BPH (benign prostatic hyperplasia)   ? CAD (coronary artery disease)   ? a.) LHC 11/30/2006: non-obstructive CAD with EF 40%; 10% pRCA, 20% pLAD, 10% D1; no intervention.  ? Complication of anesthesia   ? a.) post operative "transient alteration of awareness"/delirium/depression; multiple occassions lasting up to 2 weeks; required psychiatry consult following TEVAR in 12/2018.  ? COPD (chronic obstructive pulmonary disease) ([place])   ? Depression   ? Diet-controlled type 2 diabetes mellitus ([place])   ? Gallstone pancreatitis   ? GERD (gastroesophageal reflux disease)   ? History of 2019 novel coronavirus disease (COVID-19) 11/08/2018  ? History of repair of dissecting thoracic aneurysm   ? a.) s/p button Bentall procedure; transverse aortic hemi-arch graft (#26 Dacron graft). b.) chronic dissection (Stanford Type B); s/p ascending aorta and hemi-arch replacement via redo sternotomy on 12/02/2018. c.) s/p TEVAR with intentional LEFT subclavian artery coverage, LEFT carotid-subclavian bypass, and proximal LEFT subclavian artery vascular plug on 12/09/2018  ? HTN (hypertension)   ? Hypercholesterolemia   ? Long term current use of anticoagulant   ? a.) warfarin  ? PAF (paroxysmal atrial fibrillation) ([facility])   ? a.) CHA2DS2-VASc = 5 (age, HTN, CVA x 2, T2DM). b.)  rate/rhythm controlled with oral diltiazem; chronically anticoagulated using warfarin.  ? Right thalamic infarction ([place]) 06/04/2012  ? TIA (transient ischemic attack) 02/2010  ? ? ?Surgical History: ?Past Surgical History:  ?Procedure Laterality Date  ? ACHILLES TENDON REPAIR    ? ASCENDING AORTIC ANEURYSM REPAIR W/ MECHANICAL AORTIC VALVE REPLACEMENT N/A 12/01/2006  ? Procedure: TRANSVERSE AORTIC HEMI-ARCH GRAFT W/ MECHANICAL AORTIC VALVE REPLACEMENT (BUTTON BENTALL PROCEDURE; 23 mm On-X MECHANICAL VALVED CONDUIT; 28 mm VASCUTEK VALSALVA GRAFT; 26 mm DACRON GRAFT); Location: [facility]; Surgeon: Mart Piggs, MD  ? BACK SURGERY    ? CARDIAC VALVE REPLACEMENT    ? CHOLECYSTECTOMY N/A 04/12/2020  ? Procedure: LAPAROSCOPIC CHOLECYSTECTOMY;  Surgeon: Coralie Keens, MD;  Location: [facility];  Service: General;  Laterality: N/A;  ? FOOT SURGERY    ? LEFT HEART CATH AND CORONARY ANGIOGRAPHY Left 11/30/2006  ? Procedure: LEFT HEART CATH AND CORONARY ANGIOGRAPHY; Location: [facility]; Surgeon: Lesle Reek, MD  ? REPAIR OF ACUTE ASCENDING THORACIC AORTIC DISSECTION N/A 12/02/2018  ? Procedure: ASCENDING AORTIC AND HEMIARCH REPLACEMENT VIA REDO STERNOTOMY; Location: [facility]; Surgeon: Mart Piggs, MD  ? THORACIC ENDOVASCULAR AORTIC REPAIR (TEVAR) N/A 12/09/2018  ? Procedure: TEVAR W/ INTENTIONAL LEFT SUBCLAVIAN ARTERY COVERAGE, LEFT CAROTID-SUBCLAVIAN BYPASS, AND PROXIMAL LEFT SUBCLAVIAN ARTERY VASCULAR PLUG; Location: [facility]; Surgeon: Mart Piggs, MD  ? ? ? ?Family History: ?Family History  ?Problem Relation Age of Onset  ? Diabetes Mother   ? Alzheimer's disease Father   ? Diabetes Father   ?     age onset DM  ? Hypertension Sister   ? Heart attack Sister   ?  Diabetes Sister   ? Hypertension Brother   ? Colon cancer Neg Hx   ? Prostate cancer Neg Hx   ? Bladder Cancer Neg Hx   ? Kidney cancer Neg Hx   ? ? ?Social History:  reports that he has never smoked. He has never used smokeless tobacco. He reports that he does not drink alcohol  and does not use drugs. ? ?Physical Exam: ?BP 140/66   Pulse 95   Temp (!) 97.4 ?F (36.3 ?C) (Temporal)   Resp 16   Ht 6' (1.829 m)   Wt 86.2 kg   SpO2 95%   BMI 25.77 kg/m?   ? ?Constitutional:  Alert and oriented, No acute distress. ?Cardiovascular: RRR ?Respiratory: CTA bilaterally ?GI: Abdomen is soft, nontender, nondistended, no abdominal masses ? ?Laboratory Data: ?Culture 3/13 no growth ? ?Assessment & Plan:   ?71 yo M with long history of BPH and obstructive symptoms. ? ?We discussed the overlap between BPH and overactive bladder, but that his symptoms and cystoscopy today are most consistent with BPH with some component of incomplete bladder emptying.  We discussed options with outlet procedures like UroLift or HOLEP.  With his small prostate and desire for smallest side effect profile from a procedure, I think UroLift is a better option.  We discussed this is a 10 to 15-minute procedure performed with sedation where small pins were placed to prop open the prostate channel and improve urinary flow and emptying.  Patient's go home the same day, and rarely would need a catheter for 1 to 2 days.  It would be common to have dysuria, urgency/frequency as the bladder just for the first 1 to 2 weeks, but by 3 to 4 weeks would expect him to be doing well with better emptying and stronger stream.  Little to no risk of incontinence, no sexual side effects.   ? ?UroLift today ? ?Nickolas Madrid, MD ?04/26/2021 ? ?[facility] ?[address] ?[place], [place] [address] ?[phone] ? ? ?

## 2021-04-27 NOTE — Anesthesia Postprocedure Evaluation (Signed)
Anesthesia Post Note ? ?Patient: Alexander Barton ? ?Procedure(s) Performed: CYSTOSCOPY WITH INSERTION OF UROLIFT ? ?Patient location during evaluation: PACU ?Anesthesia Type: MAC ?Level of consciousness: awake and alert ?Pain management: pain level controlled ?Vital Signs Assessment: post-procedure vital signs reviewed and stable ?Respiratory status: spontaneous breathing, nonlabored ventilation, respiratory function stable and patient connected to nasal cannula oxygen ?Cardiovascular status: blood pressure returned to baseline and stable ?Postop Assessment: no apparent nausea or vomiting ?Anesthetic complications: no ? ? ?No notable events documented. ? ? ?Last Vitals:  ?Vitals:  ? 04/26/21 0824 04/26/21 0829  ?BP: 120/71 132/87  ?Pulse: 68 78  ?Resp: 17 18  ?Temp: (!) 36.1 ?C (!) 36.2 ?C  ?SpO2: 96% 99%  ?  ?Last Pain:  ?Vitals:  ? 04/26/21 0829  ?TempSrc: Temporal  ?PainSc: 0-No pain  ? ? ?  ?  ?  ?  ?  ?  ? ?Martha Clan ? ? ? ? ?

## 2021-04-29 ENCOUNTER — Encounter: Payer: Self-pay | Admitting: Urology

## 2021-05-02 ENCOUNTER — Telehealth: Payer: Self-pay

## 2021-05-02 ENCOUNTER — Ambulatory Visit (INDEPENDENT_AMBULATORY_CARE_PROVIDER_SITE_OTHER): Payer: Medicare PPO | Admitting: Cardiology

## 2021-05-02 ENCOUNTER — Telehealth: Payer: Self-pay | Admitting: Cardiology

## 2021-05-02 DIAGNOSIS — Z5181 Encounter for therapeutic drug level monitoring: Secondary | ICD-10-CM | POA: Diagnosis not present

## 2021-05-02 DIAGNOSIS — I359 Nonrheumatic aortic valve disorder, unspecified: Secondary | ICD-10-CM

## 2021-05-02 DIAGNOSIS — Z7901 Long term (current) use of anticoagulants: Secondary | ICD-10-CM

## 2021-05-02 LAB — POCT INR: INR: 1.3 — AB (ref 2.0–3.0)

## 2021-05-02 NOTE — Telephone Encounter (Signed)
Spoke with patient and wife. Advised of recommendations and will call us with any concerns.  ?

## 2021-05-02 NOTE — Telephone Encounter (Signed)
Push fluids and will likely clear, most likely related to his anticoagulation. No strenuous activity and call us if unable to urinate ? ?Nickolas Madrid, MD ?05/02/2021 ? ?

## 2021-05-02 NOTE — Telephone Encounter (Signed)
[employer] [employer] calling to report an out of range INR. Hung up before I could transfer.  ?

## 2021-05-02 NOTE — Telephone Encounter (Signed)
Patient called in this morning and states that he has minimal to no blood in his urine since surgery 6 days ago. Reports that last night he started having dark red blood in his urine and has continued through this morning. Patient states that he was told to contact you if blood was dark. Patient states that he thinks he may have passed a clot last night as well. Please advise what you recommend. I did ask if he had been lifting or increasing movement/activity prior to. He said that he did lift the trash can that was probably 5-10 pounds earlier in the evening.  ?

## 2021-05-06 ENCOUNTER — Encounter: Payer: Medicare PPO | Admitting: Nurse Practitioner

## 2021-05-29 ENCOUNTER — Ambulatory Visit: Payer: Medicare PPO | Admitting: Urology

## 2021-06-03 NOTE — Progress Notes (Signed)
This encounter was created in error - please disregard.

## 2021-06-03 DEATH — deceased

## 2021-06-25 ENCOUNTER — Ambulatory Visit (HOSPITAL_COMMUNITY): Payer: Medicare PPO | Admitting: Physician Assistant

## 2021-09-09 ENCOUNTER — Ambulatory Visit: Payer: Medicare PPO | Admitting: Adult Health

## 2021-10-14 ENCOUNTER — Ambulatory Visit: Payer: Medicare PPO | Admitting: Family Medicine

## 2023-12-22 NOTE — Telephone Encounter (Signed)
 open in error
